# Patient Record
Sex: Female | Born: 1940 | Race: Black or African American | Hispanic: No | Smoking: Never smoker
Health system: Southern US, Community
[De-identification: ages and names within clinical notes are randomized; demographics above are authoritative.]

## PROBLEM LIST (undated history)

## (undated) DIAGNOSIS — E669 Obesity, unspecified: Secondary | ICD-10-CM

## (undated) DIAGNOSIS — C50919 Malignant neoplasm of unspecified site of unspecified female breast: Secondary | ICD-10-CM

## (undated) DIAGNOSIS — Z780 Asymptomatic menopausal state: Secondary | ICD-10-CM

## (undated) DIAGNOSIS — Z860101 Personal history of adenomatous and serrated colon polyps: Secondary | ICD-10-CM

## (undated) DIAGNOSIS — B029 Zoster without complications: Secondary | ICD-10-CM

## (undated) DIAGNOSIS — F32A Depression, unspecified: Secondary | ICD-10-CM

## (undated) DIAGNOSIS — Z923 Personal history of irradiation: Secondary | ICD-10-CM

## (undated) DIAGNOSIS — Z8619 Personal history of other infectious and parasitic diseases: Secondary | ICD-10-CM

## (undated) DIAGNOSIS — R739 Hyperglycemia, unspecified: Secondary | ICD-10-CM

## (undated) DIAGNOSIS — D649 Anemia, unspecified: Secondary | ICD-10-CM

## (undated) DIAGNOSIS — E785 Hyperlipidemia, unspecified: Secondary | ICD-10-CM

## (undated) DIAGNOSIS — Z9221 Personal history of antineoplastic chemotherapy: Secondary | ICD-10-CM

## (undated) DIAGNOSIS — Z86010 Personal history of colonic polyps: Secondary | ICD-10-CM

## (undated) DIAGNOSIS — K219 Gastro-esophageal reflux disease without esophagitis: Secondary | ICD-10-CM

## (undated) DIAGNOSIS — J449 Chronic obstructive pulmonary disease, unspecified: Secondary | ICD-10-CM

## (undated) DIAGNOSIS — N6320 Unspecified lump in the left breast, unspecified quadrant: Secondary | ICD-10-CM

## (undated) HISTORY — PX: TONSILLECTOMY: SUR1361

## (undated) HISTORY — DX: Obesity, unspecified: E66.9

## (undated) HISTORY — PX: ABDOMINAL HYSTERECTOMY: SHX81

## (undated) HISTORY — PX: TONSILECTOMY, ADENOIDECTOMY, BILATERAL MYRINGOTOMY AND TUBES: SHX2538

## (undated) HISTORY — DX: Unspecified lump in the left breast, unspecified quadrant: N63.20

## (undated) HISTORY — DX: Hyperglycemia, unspecified: R73.9

## (undated) HISTORY — DX: Malignant neoplasm of unspecified site of unspecified female breast: C50.919

## (undated) HISTORY — DX: Asymptomatic menopausal state: Z78.0

## (undated) HISTORY — DX: Zoster without complications: B02.9

---

## 2003-06-20 ENCOUNTER — Other Ambulatory Visit: Payer: Self-pay

## 2004-06-03 ENCOUNTER — Emergency Department: Payer: Self-pay | Admitting: General Practice

## 2007-09-05 ENCOUNTER — Ambulatory Visit: Payer: Self-pay | Admitting: Gastroenterology

## 2008-01-25 ENCOUNTER — Emergency Department: Payer: Self-pay | Admitting: Emergency Medicine

## 2008-03-31 ENCOUNTER — Emergency Department: Payer: Self-pay | Admitting: Emergency Medicine

## 2008-03-31 ENCOUNTER — Other Ambulatory Visit: Payer: Self-pay

## 2008-08-29 ENCOUNTER — Ambulatory Visit: Payer: Self-pay | Admitting: Internal Medicine

## 2008-10-09 ENCOUNTER — Ambulatory Visit: Payer: Self-pay | Admitting: Unknown Physician Specialty

## 2010-03-17 ENCOUNTER — Ambulatory Visit: Payer: Self-pay | Admitting: Internal Medicine

## 2011-02-04 ENCOUNTER — Ambulatory Visit: Payer: Self-pay | Admitting: Unknown Physician Specialty

## 2011-02-04 LAB — HM COLONOSCOPY

## 2011-12-02 ENCOUNTER — Ambulatory Visit: Payer: Self-pay | Admitting: Family Medicine

## 2011-12-11 ENCOUNTER — Emergency Department: Payer: Self-pay | Admitting: Emergency Medicine

## 2011-12-11 LAB — COMPREHENSIVE METABOLIC PANEL
Albumin: 3.6 g/dL (ref 3.4–5.0)
Alkaline Phosphatase: 89 U/L (ref 50–136)
Bilirubin,Total: 0.3 mg/dL (ref 0.2–1.0)
Calcium, Total: 9.6 mg/dL (ref 8.5–10.1)
Creatinine: 1.08 mg/dL (ref 0.60–1.30)
EGFR (Non-African Amer.): 51 — ABNORMAL LOW
Glucose: 79 mg/dL (ref 65–99)
Osmolality: 283 (ref 275–301)
SGOT(AST): 24 U/L (ref 15–37)

## 2011-12-11 LAB — CBC
HCT: 38.8 % (ref 35.0–47.0)
HGB: 12.5 g/dL (ref 12.0–16.0)
MCH: 29 pg (ref 26.0–34.0)
MCV: 90 fL (ref 80–100)
Platelet: 318 10*3/uL (ref 150–440)
RDW: 14.3 % (ref 11.5–14.5)
WBC: 8.3 10*3/uL (ref 3.6–11.0)

## 2012-03-01 ENCOUNTER — Ambulatory Visit: Payer: Self-pay | Admitting: Unknown Physician Specialty

## 2012-06-14 ENCOUNTER — Ambulatory Visit: Payer: Self-pay | Admitting: Unknown Physician Specialty

## 2012-07-01 DIAGNOSIS — E785 Hyperlipidemia, unspecified: Secondary | ICD-10-CM | POA: Insufficient documentation

## 2012-07-01 DIAGNOSIS — K219 Gastro-esophageal reflux disease without esophagitis: Secondary | ICD-10-CM | POA: Insufficient documentation

## 2013-12-18 DEATH — deceased

## 2014-09-12 ENCOUNTER — Ambulatory Visit: Payer: Self-pay | Admitting: Nurse Practitioner

## 2014-12-21 ENCOUNTER — Encounter: Payer: Self-pay | Admitting: *Deleted

## 2014-12-24 ENCOUNTER — Encounter: Payer: Self-pay | Admitting: Anesthesiology

## 2014-12-24 ENCOUNTER — Ambulatory Visit: Payer: Medicare Other | Admitting: Anesthesiology

## 2014-12-24 ENCOUNTER — Ambulatory Visit
Admission: RE | Admit: 2014-12-24 | Discharge: 2014-12-24 | Disposition: A | Discharge status: Home / Self Care | Payer: Medicare Other | Source: Ambulatory Visit | Attending: Unknown Physician Specialty | Admitting: Unknown Physician Specialty

## 2014-12-24 ENCOUNTER — Encounter
Admission: RE | Disposition: A | Discharge status: Home / Self Care | Payer: Self-pay | Source: Ambulatory Visit | Attending: Unknown Physician Specialty

## 2014-12-24 DIAGNOSIS — E785 Hyperlipidemia, unspecified: Secondary | ICD-10-CM | POA: Diagnosis not present

## 2014-12-24 DIAGNOSIS — Z79899 Other long term (current) drug therapy: Secondary | ICD-10-CM | POA: Insufficient documentation

## 2014-12-24 DIAGNOSIS — K219 Gastro-esophageal reflux disease without esophagitis: Secondary | ICD-10-CM | POA: Insufficient documentation

## 2014-12-24 DIAGNOSIS — Z8719 Personal history of other diseases of the digestive system: Secondary | ICD-10-CM | POA: Diagnosis present

## 2014-12-24 DIAGNOSIS — K317 Polyp of stomach and duodenum: Secondary | ICD-10-CM | POA: Insufficient documentation

## 2014-12-24 DIAGNOSIS — Z7982 Long term (current) use of aspirin: Secondary | ICD-10-CM | POA: Diagnosis not present

## 2014-12-24 HISTORY — DX: Gastro-esophageal reflux disease without esophagitis: K21.9

## 2014-12-24 HISTORY — DX: Hyperlipidemia, unspecified: E78.5

## 2014-12-24 HISTORY — PX: ESOPHAGOGASTRODUODENOSCOPY (EGD) WITH PROPOFOL: SHX5813

## 2014-12-24 SURGERY — ESOPHAGOGASTRODUODENOSCOPY (EGD) WITH PROPOFOL
Anesthesia: General

## 2014-12-24 MED ORDER — FENTANYL CITRATE (PF) 100 MCG/2ML IJ SOLN
INTRAMUSCULAR | Status: DC | PRN
  Administered 2014-12-24: 50 ug via INTRAVENOUS

## 2014-12-24 MED ORDER — PROPOFOL INFUSION 10 MG/ML OPTIME
INTRAVENOUS | Status: DC | PRN
  Administered 2014-12-24: 75 ug/kg/min via INTRAVENOUS

## 2014-12-24 MED ORDER — SODIUM CHLORIDE 0.9 % IV SOLN: INTRAVENOUS | Status: DC

## 2014-12-24 MED ORDER — GLYCOPYRROLATE 0.2 MG/ML IJ SOLN
INTRAMUSCULAR | Status: DC | PRN
  Administered 2014-12-24: 0.2 mg via INTRAVENOUS

## 2014-12-24 MED ORDER — MIDAZOLAM HCL 2 MG/2ML IJ SOLN
INTRAMUSCULAR | Status: DC | PRN
  Administered 2014-12-24: 1 mg via INTRAVENOUS

## 2014-12-24 MED ORDER — LACTATED RINGERS IV SOLN
INTRAVENOUS | Status: DC
  Administered 2014-12-24: 1000 mL via INTRAVENOUS

## 2014-12-24 NOTE — H&P (Signed)
   Primary Care Physician:  Rubbie Battiest, NP Primary Gastroenterologist:  Dr. Vira Agar  Pre-Procedure History & Physical: HPI:  Maria Patterson is a 74 y.o. female is here for an endoscopy.   Past Medical History  Diagnosis Date  . GERD (gastroesophageal reflux disease)   . Hyperlipidemia     History reviewed. No pertinent past surgical history.  Prior to Admission medications   Medication Sig Start Date End Date Taking? Authorizing Provider  aspirin 81 MG tablet Take 81 mg by mouth daily.   Yes Historical Provider, MD  atorvastatin (LIPITOR) 20 MG tablet Take 20 mg by mouth daily.   Yes Historical Provider, MD  cholecalciferol (VITAMIN D) 400 UNITS TABS tablet Take 2,000 Units by mouth.   Yes Historical Provider, MD  Multiple Vitamin (MULTIVITAMIN WITH MINERALS) TABS tablet Take 1 tablet by mouth daily.   Yes Historical Provider, MD  omeprazole (PRILOSEC) 20 MG capsule Take 20 mg by mouth daily.   Yes Historical Provider, MD    Allergies as of 11/20/2014  . (Not on File)    History reviewed. No pertinent family history.  History   Social History  . Marital Status: Widowed    Spouse Name: N/A  . Number of Children: N/A  . Years of Education: N/A   Occupational History  . Not on file.   Social History Main Topics  . Smoking status: Not on file  . Smokeless tobacco: Not on file  . Alcohol Use: Not on file  . Drug Use: Not on file  . Sexual Activity: Not on file   Other Topics Concern  . Not on file   Social History Narrative    Review of Systems: See HPI, otherwise negative ROS  Physical Exam: BP 164/71 mmHg  Pulse 62  Temp(Src) 97.3 F (36.3 C) (Tympanic)  Resp 16  Ht 5\' 4"  (1.626 m)  Wt 81.647 kg (180 lb)  BMI 30.88 kg/m2  SpO2 100% General:   Alert,  pleasant and cooperative in NAD Head:  Normocephalic and atraumatic. Neck:  Supple; no masses or thyromegaly. Lungs:  Clear throughout to auscultation.    Heart:  Regular rate and rhythm. Abdomen:   Soft, nontender and nondistended. Normal bowel sounds, without guarding, and without rebound.   Neurologic:  Alert and  oriented x4;  grossly normal neurologically.  Impression/Plan: Maria Patterson is here for an endoscopy to be performed for follow up previously removed large duodenal polyp.  Risks, benefits, limitations, and alternatives regarding  endoscopy have been reviewed with the patient.  Questions have been answered.  All parties agreeable.   Gaylyn Cheers, MD  12/24/2014, 9:25 AM

## 2014-12-24 NOTE — Anesthesia Postprocedure Evaluation (Signed)
  Anesthesia Post-op Note  Patient: Maria Patterson  Procedure(s) Performed: Procedure(s): ESOPHAGOGASTRODUODENOSCOPY (EGD) WITH PROPOFOL (N/A)  Anesthesia type:General  Patient location: PACU  Post pain: Pain level controlled  Post assessment: Post-op Vital signs reviewed, Patient's Cardiovascular Status Stable, Respiratory Function Stable, Patent Airway and No signs of Nausea or vomiting  Post vital signs: Reviewed and stable  Last Vitals:  Filed Vitals:   12/24/14 1000  BP: 150/84  Pulse: 78  Temp:   Resp: 13    Level of consciousness: awake, alert  and patient cooperative  Complications: No apparent anesthesia complications

## 2014-12-24 NOTE — Anesthesia Preprocedure Evaluation (Signed)
Anesthesia Evaluation  Patient identified by MRN, date of birth, ID band Patient awake    Reviewed: Allergy & Precautions, NPO status , Patient's Chart, lab work & pertinent test results, reviewed documented beta blocker date and time   Airway Mallampati: III  TM Distance: >3 FB     Dental  (+) Chipped   Pulmonary          Cardiovascular     Neuro/Psych    GI/Hepatic GERD-  ,  Endo/Other    Renal/GU      Musculoskeletal   Abdominal   Peds  Hematology   Anesthesia Other Findings   Reproductive/Obstetrics                             Anesthesia Physical Anesthesia Plan  ASA: III  Anesthesia Plan: General   Post-op Pain Management:    Induction: Intravenous  Airway Management Planned: Nasal Cannula  Additional Equipment:   Intra-op Plan:   Post-operative Plan:   Informed Consent: I have reviewed the patients History and Physical, chart, labs and discussed the procedure including the risks, benefits and alternatives for the proposed anesthesia with the patient or authorized representative who has indicated his/her understanding and acceptance.     Plan Discussed with: CRNA  Anesthesia Plan Comments:         Anesthesia Quick Evaluation

## 2014-12-24 NOTE — Transfer of Care (Signed)
Immediate Anesthesia Transfer of Care Note  Patient: Maria Patterson  Procedure(s) Performed: Procedure(s): ESOPHAGOGASTRODUODENOSCOPY (EGD) WITH PROPOFOL (N/A)  Patient Location: PACU  Anesthesia Type:General  Level of Consciousness: awake  Airway & Oxygen Therapy: Patient Spontanous Breathing  Post-op Assessment: Report given to RN  Post vital signs: stable  Last Vitals:  Filed Vitals:   12/24/14 0949  BP: 148/86  Pulse: 99  Temp: 36.4 C  Resp:     Complications: No apparent anesthesia complications

## 2014-12-24 NOTE — Op Note (Signed)
[facility] Gastroenterology Patient Name: Maria Patterson Procedure Date: 12/24/2014 9:22 AM MRN: [id number] Account #: [id number] Date of Birth: July 09, 1941 Admit Type: Outpatient Age: 74 Room: [facility] ENDO ROOM 1 Gender: Female Note Status: Finalized Procedure:         Upper GI endoscopy Indications:       follow up duodenal polyp Providers:         Manya Silvas, MD Referring MD:      Manya Silvas, MD (Referring MD) Medicines:         Propofol per Anesthesia Complications:     No immediate complications. Procedure:         Pre-Anesthesia Assessment:                    - After reviewing the risks and benefits, the patient was                     deemed in satisfactory condition to undergo the procedure.                    After obtaining informed consent, the endoscope was passed                     under direct vision. Throughout the procedure, the                     patient's blood pressure, pulse, and oxygen saturations                     were monitored continuously. The Olympus GIF-160 endoscope                     (S#. [id number]) was introduced through the mouth, and                     advanced to the second part of duodenum. The upper GI                     endoscopy was accomplished without difficulty. The patient                     tolerated the procedure well. Findings:      The examined esophagus was normal. GEJ 39cm.      The stomach was normal.      A single medium-sized sessile polyp with no bleeding was found in the       second part of the duodenum. Biopsies were taken with a cold forceps for       histology. This could be the ampulla somewhat enlarged and biopsies       done, tthhe mucosa had a somewhat irritated apearance. Impression:        - Normal esophagus.                    - Normal stomach.                    - A single duodenal polyp. Biopsied. Recommendation:    - Await pathology results. Manya Silvas, MD 12/24/2014 9:51:41  AM This report has been signed electronically. Number of Addenda: 0 Note Initiated On: 12/24/2014 9:22 AM      [facility]

## 2014-12-25 LAB — SURGICAL PATHOLOGY

## 2014-12-27 ENCOUNTER — Encounter: Payer: Self-pay | Admitting: Unknown Physician Specialty

## 2015-09-10 ENCOUNTER — Encounter: Payer: Self-pay | Admitting: Family Medicine

## 2015-09-10 ENCOUNTER — Ambulatory Visit (INDEPENDENT_AMBULATORY_CARE_PROVIDER_SITE_OTHER): Payer: Medicare Other | Admitting: Family Medicine

## 2015-09-10 VITALS — BP 138/72 | HR 86 | Temp 98.9°F | Resp 16 | Ht 65.0 in | Wt 177.0 lb

## 2015-09-10 DIAGNOSIS — R739 Hyperglycemia, unspecified: Secondary | ICD-10-CM | POA: Diagnosis not present

## 2015-09-10 DIAGNOSIS — Z23 Encounter for immunization: Secondary | ICD-10-CM | POA: Diagnosis not present

## 2015-09-10 DIAGNOSIS — K219 Gastro-esophageal reflux disease without esophagitis: Secondary | ICD-10-CM | POA: Diagnosis not present

## 2015-09-10 DIAGNOSIS — E785 Hyperlipidemia, unspecified: Secondary | ICD-10-CM

## 2015-09-10 NOTE — Progress Notes (Signed)
Name: Maria Patterson   MRN: [id number]    DOB: 1941/05/13   Date:09/10/2015       Progress Note  Subjective  Chief Complaint  Chief Complaint  Patient presents with  . Hyperlipidemia    follow up  . Gastroesophageal Reflux    HPI  Maria Patterson is a 75 year old female with HLD and GERD here for routine follow up. She continues to NOT take her statin medication regularly with no problems reported. Taking omeprazole only as needed for GERD with no problems reported. Otherwise takes a baby aspirin daily with no reported GI bleed or dark tarry stools. She wants to be tested for diabetes.    Past Medical History  Diagnosis Date  . GERD (gastroesophageal reflux disease)   . Hyperlipidemia     Patient Active Problem List   Diagnosis Date Noted  . Acid reflux 07/01/2012  . HLD (hyperlipidemia) 07/01/2012    Social History  Substance Use Topics  . Smoking status: Never Smoker   . Smokeless tobacco: Not on file  . Alcohol Use: Not on file     Current outpatient prescriptions:  .  aspirin 81 MG tablet, Take 81 mg by mouth daily., Disp: , Rfl:  .  atorvastatin (LIPITOR) 20 MG tablet, Take 20 mg by mouth daily., Disp: , Rfl:  .  cholecalciferol (VITAMIN D) 400 UNITS TABS tablet, Take 2,000 Units by mouth., Disp: , Rfl:  .  Multiple Vitamin (MULTIVITAMIN WITH MINERALS) TABS tablet, Take 1 tablet by mouth daily., Disp: , Rfl:  .  omeprazole (PRILOSEC) 20 MG capsule, Take 20 mg by mouth daily., Disp: , Rfl:   Past Surgical History  Procedure Laterality Date  . Esophagogastroduodenoscopy (egd) with propofol N/A 12/24/2014    Procedure: ESOPHAGOGASTRODUODENOSCOPY (EGD) WITH PROPOFOL;  Surgeon: Manya Silvas, MD;  Location: [place];  Service: Endoscopy;  Laterality: N/A;    History reviewed. No pertinent family history.  Allergies  Allergen Reactions  . Latex Itching  . Penicillins Rash     Review of Systems  CONSTITUTIONAL: No significant weight changes, fever,  chills, weakness or fatigue.  HEENT:  - Eyes: No visual changes.  - Ears: No auditory changes. No pain.  - Nose: No sneezing, congestion, runny nose. - Throat: No sore throat. No changes in swallowing. SKIN: No rash or itching.  CARDIOVASCULAR: No chest pain, chest pressure or chest discomfort. No palpitations or edema.  RESPIRATORY: No shortness of breath, cough or sputum.  GASTROINTESTINAL: No anorexia, nausea, vomiting. No changes in bowel habits. No abdominal pain or blood.  GENITOURINARY: No dysuria. No frequency. No discharge.  NEUROLOGICAL: No headache, dizziness, syncope, paralysis, ataxia, numbness or tingling in the extremities. No memory changes. No change in bowel or bladder control.  MUSCULOSKELETAL: No joint pain. No muscle pain. HEMATOLOGIC: No anemia, bleeding or bruising.  LYMPHATICS: No enlarged lymph nodes.  PSYCHIATRIC: No change in mood. No change in sleep pattern.  ENDOCRINOLOGIC: No reports of sweating, cold or heat intolerance. No polyuria or polydipsia.     Objective  BP 138/72 mmHg  Pulse 86  Temp(Src) 98.9 F (37.2 C) (Oral)  Resp 16  Ht 5\' 5"  (1.651 m)  Wt 177 lb (80.287 kg)  BMI 29.45 kg/m2  SpO2 96% Body mass index is 29.45 kg/(m^2).  Physical Exam  Constitutional: Patient appears well-developed and well-nourished. In no distress.  HEENT:  - Head: Normocephalic and atraumatic.  - Ears: Bilateral TMs gray, no erythema or effusion - Nose: Nasal mucosa moist -  Mouth/Throat: Oropharynx is clear and moist. No tonsillar hypertrophy or erythema. No post nasal drainage.  - Eyes: Conjunctivae clear, EOM movements normal. PERRLA. No scleral icterus.  Neck: Normal range of motion. Neck supple. No JVD present. No thyromegaly present.  Cardiovascular: Normal rate, regular rhythm and normal heart sounds.  No murmur heard.  Pulmonary/Chest: Effort normal and breath sounds normal. No respiratory distress. Musculoskeletal: Normal range of motion bilateral UE  and LE, no joint effusions. Peripheral vascular: Bilateral LE no edema. Neurological: CN II-XII grossly intact with no focal deficits. Alert and oriented to person, place, and time. Coordination, balance, strength, speech and gait are normal.  Skin: Skin is warm and dry. No rash noted. No erythema.  Psychiatric: Patient has a normal mood and affect. Behavior is normal in office today. Judgment and thought content normal in office today.  Assessment & Plan   1. GERD without esophagitis Using PPI prn only.  - CBC with Differential/Platelet - Comprehensive metabolic panel  2. Hyperlipidemia LDL goal <100 Inconsistent statin use.  - CBC with Differential/Platelet - Comprehensive metabolic panel - Lipid panel - TSH  3. Hyperglycemia Will test for pre-diabetes or diabetes.  - Hemoglobin A1c - TSH  4. Need for pneumococcal vaccination  - Pneumococcal conjugate vaccine 13-valent  5. Needs flu shot  - Flu vaccine HIGH DOSE PF (Fluzone High dose)

## 2015-10-24 LAB — CBC WITH DIFFERENTIAL/PLATELET
Basophils Absolute: 0 10*3/uL (ref 0.0–0.2)
Basos: 1 %
EOS (ABSOLUTE): 0.2 10*3/uL (ref 0.0–0.4)
EOS: 3 %
HEMATOCRIT: 36.1 % (ref 34.0–46.6)
Hemoglobin: 11.9 g/dL (ref 11.1–15.9)
IMMATURE GRANS (ABS): 0 10*3/uL (ref 0.0–0.1)
IMMATURE GRANULOCYTES: 0 %
LYMPHS: 47 %
Lymphocytes Absolute: 2.6 10*3/uL (ref 0.7–3.1)
MCH: 29.3 pg (ref 26.6–33.0)
MCHC: 33 g/dL (ref 31.5–35.7)
MCV: 89 fL (ref 79–97)
MONOS ABS: 0.3 10*3/uL (ref 0.1–0.9)
Monocytes: 5 %
NEUTROS PCT: 44 %
Neutrophils Absolute: 2.4 10*3/uL (ref 1.4–7.0)
PLATELETS: 247 10*3/uL (ref 150–379)
RBC: 4.06 x10E6/uL (ref 3.77–5.28)
RDW: 15 % (ref 12.3–15.4)
WBC: 5.5 10*3/uL (ref 3.4–10.8)

## 2015-10-24 LAB — COMPREHENSIVE METABOLIC PANEL
ALT: 13 IU/L (ref 0–32)
AST: 17 IU/L (ref 0–40)
Albumin/Globulin Ratio: 1.5 (ref 1.2–2.2)
Albumin: 3.8 g/dL (ref 3.5–4.8)
Alkaline Phosphatase: 81 IU/L (ref 39–117)
BUN/Creatinine Ratio: 20 (ref 12–28)
BUN: 24 mg/dL (ref 8–27)
Bilirubin Total: 0.2 mg/dL (ref 0.0–1.2)
CALCIUM: 9.5 mg/dL (ref 8.7–10.3)
CO2: 19 mmol/L (ref 18–29)
CREATININE: 1.22 mg/dL — AB (ref 0.57–1.00)
Chloride: 110 mmol/L — ABNORMAL HIGH (ref 96–106)
GFR calc Af Amer: 50 mL/min/{1.73_m2} — ABNORMAL LOW (ref >59)
GFR, EST NON AFRICAN AMERICAN: 43 mL/min/{1.73_m2} — AB (ref >59)
Globulin, Total: 2.6 g/dL (ref 1.5–4.5)
Glucose: 78 mg/dL (ref 65–99)
Potassium: 4.6 mmol/L (ref 3.5–5.2)
Sodium: 144 mmol/L (ref 134–144)
TOTAL PROTEIN: 6.4 g/dL (ref 6.0–8.5)

## 2015-10-24 LAB — LIPID PANEL
CHOLESTEROL TOTAL: 318 mg/dL — AB (ref 100–199)
Chol/HDL Ratio: 4.6 ratio units — ABNORMAL HIGH (ref 0.0–4.4)
HDL: 69 mg/dL (ref >39)
LDL Calculated: 233 mg/dL — ABNORMAL HIGH (ref 0–99)
TRIGLYCERIDES: 78 mg/dL (ref 0–149)
VLDL CHOLESTEROL CAL: 16 mg/dL (ref 5–40)

## 2015-10-24 LAB — TSH: TSH: 0.713 u[IU]/mL (ref 0.450–4.500)

## 2015-10-24 LAB — HEMOGLOBIN A1C
ESTIMATED AVERAGE GLUCOSE: 117 mg/dL
Hgb A1c MFr Bld: 5.7 % — ABNORMAL HIGH (ref 4.8–5.6)

## 2015-10-26 ENCOUNTER — Telehealth: Payer: Self-pay | Admitting: Family Medicine

## 2015-10-26 DIAGNOSIS — E785 Hyperlipidemia, unspecified: Secondary | ICD-10-CM

## 2015-10-26 DIAGNOSIS — Z5181 Encounter for therapeutic drug level monitoring: Secondary | ICD-10-CM

## 2015-10-26 NOTE — Telephone Encounter (Signed)
Labs ordered by Dr. Chauncey Cruel received; will call patient with results; very high LDL

## 2015-10-28 NOTE — Telephone Encounter (Signed)
Tried reaching patient; will try again another day

## 2015-10-30 DIAGNOSIS — Z5181 Encounter for therapeutic drug level monitoring: Secondary | ICD-10-CM | POA: Insufficient documentation

## 2015-10-30 MED ORDER — ATORVASTATIN CALCIUM 40 MG PO TABS: 40.0000 mg | ORAL_TABLET | Freq: Every day | ORAL | Status: DC

## 2015-10-30 NOTE — Assessment & Plan Note (Signed)
Start 40 mg atorvastatin; recheck lipids, sgpt 8 weeks

## 2015-10-30 NOTE — Assessment & Plan Note (Signed)
Check SGPT in 8 weeks 

## 2015-10-30 NOTE — Telephone Encounter (Signed)
I spoke with patient; she actually quit taking her statin a while back, just said it was old; no problems on it, willing to go back on and wonders if she needs higher dose after I read her the numbers; I have no doubt this is inherited; start atorvastatin 40 mg, recheck lipids and sgpt in 8 weeks; return in August for next regular visit

## 2016-03-09 ENCOUNTER — Encounter: Payer: Self-pay | Admitting: Family Medicine

## 2016-03-09 ENCOUNTER — Other Ambulatory Visit: Payer: Self-pay | Admitting: Family Medicine

## 2016-03-09 ENCOUNTER — Ambulatory Visit (INDEPENDENT_AMBULATORY_CARE_PROVIDER_SITE_OTHER): Payer: Medicare Other | Admitting: Family Medicine

## 2016-03-09 DIAGNOSIS — J0100 Acute maxillary sinusitis, unspecified: Secondary | ICD-10-CM

## 2016-03-09 DIAGNOSIS — M2550 Pain in unspecified joint: Secondary | ICD-10-CM | POA: Insufficient documentation

## 2016-03-09 DIAGNOSIS — E785 Hyperlipidemia, unspecified: Secondary | ICD-10-CM

## 2016-03-09 DIAGNOSIS — R634 Abnormal weight loss: Secondary | ICD-10-CM | POA: Diagnosis not present

## 2016-03-09 DIAGNOSIS — R739 Hyperglycemia, unspecified: Secondary | ICD-10-CM | POA: Diagnosis not present

## 2016-03-09 DIAGNOSIS — K219 Gastro-esophageal reflux disease without esophagitis: Secondary | ICD-10-CM | POA: Diagnosis not present

## 2016-03-09 DIAGNOSIS — E663 Overweight: Secondary | ICD-10-CM | POA: Diagnosis not present

## 2016-03-09 DIAGNOSIS — J019 Acute sinusitis, unspecified: Secondary | ICD-10-CM | POA: Insufficient documentation

## 2016-03-09 HISTORY — DX: Pain in unspecified joint: M25.50

## 2016-03-09 LAB — COMPREHENSIVE METABOLIC PANEL
ALT: 15 U/L (ref 6–29)
AST: 16 U/L (ref 10–35)
Albumin: 4 g/dL (ref 3.6–5.1)
Alkaline Phosphatase: 76 U/L (ref 33–130)
BILIRUBIN TOTAL: 0.4 mg/dL (ref 0.2–1.2)
BUN: 19 mg/dL (ref 7–25)
CALCIUM: 9.4 mg/dL (ref 8.6–10.4)
CO2: 24 mmol/L (ref 20–31)
Chloride: 111 mmol/L — ABNORMAL HIGH (ref 98–110)
Creat: 1.16 mg/dL — ABNORMAL HIGH (ref 0.60–0.93)
GLUCOSE: 84 mg/dL (ref 65–99)
POTASSIUM: 4.2 mmol/L (ref 3.5–5.3)
Sodium: 144 mmol/L (ref 135–146)
Total Protein: 6.7 g/dL (ref 6.1–8.1)

## 2016-03-09 LAB — CBC WITH DIFFERENTIAL/PLATELET
Basophils Absolute: 65 cells/uL (ref 0–200)
Basophils Relative: 1 %
EOS PCT: 3 %
Eosinophils Absolute: 195 cells/uL (ref 15–500)
HCT: 38 % (ref 35.0–45.0)
Hemoglobin: 12.3 g/dL (ref 11.7–15.5)
LYMPHS ABS: 2925 {cells}/uL (ref 850–3900)
Lymphocytes Relative: 45 %
MCH: 29 pg (ref 27.0–33.0)
MCHC: 32.4 g/dL (ref 32.0–36.0)
MCV: 89.6 fL (ref 80.0–100.0)
MONOS PCT: 5 %
MPV: 10.7 fL (ref 7.5–12.5)
Monocytes Absolute: 325 cells/uL (ref 200–950)
NEUTROS ABS: 2990 {cells}/uL (ref 1500–7800)
NEUTROS PCT: 46 %
PLATELETS: 235 10*3/uL (ref 140–400)
RBC: 4.24 MIL/uL (ref 3.80–5.10)
RDW: 15.3 % — ABNORMAL HIGH (ref 11.0–15.0)
WBC: 6.5 10*3/uL (ref 3.8–10.8)

## 2016-03-09 LAB — URIC ACID: URIC ACID, SERUM: 6.9 mg/dL (ref 2.5–7.0)

## 2016-03-09 LAB — CK: CK TOTAL: 67 U/L (ref 7–177)

## 2016-03-09 MED ORDER — DOXYCYCLINE HYCLATE 100 MG PO TABS: 100.0000 mg | ORAL_TABLET | Freq: Two times a day (BID) | ORAL | 0 refills | Status: AC

## 2016-03-09 NOTE — Patient Instructions (Addendum)
Stop the statin Try to limit saturated fats in your diet (bologna, hot dogs, barbeque, cheeseburgers, hamburgers, steak, bacon, sausage, cheese, etc.) and get more fresh fruits, vegetables, and whole grains We'll check labs today Start antibiotics Please do eat yogurt daily or take a probiotic daily for the next month or two We want to replace the healthy germs in the gut If you notice foul, watery diarrhea in the next two months, schedule an appointment RIGHT AWAY If you are still aching in 2 weeks, please call me Caution: prolonged use of proton pump inhibitors like omeprazole (Prilosec), pantoprazole (Protonix), esomeprazole (Nexium), and others like Dexilant and Aciphex may increase your risk of pneumonia, Clostridium difficile colitis, osteoporosis, anemia and other health complications Try to limit or avoid triggers like coffee, caffeinated beverages, onions, chocolate, spicy foods, peppermint, acid foods like pizza, spaghetti sauce, and orange juice Lose weight if you are overweight or obese Try elevating the head of your bed by placing a small wedge between your mattress and box springs to keep acid in the stomach at night instead of coming up into your esophagus

## 2016-03-09 NOTE — Assessment & Plan Note (Signed)
Limit saturated fats; stop statin because of aching

## 2016-03-09 NOTE — Assessment & Plan Note (Signed)
Check labs; stop statin to see if that helps

## 2016-03-09 NOTE — Progress Notes (Signed)
BP 140/80   Pulse 86   Temp 98.9 F (37.2 C) (Oral)   Resp 14   Wt 171 lb (77.6 kg)   SpO2 96%   BMI 28.46 kg/m    Subjective:    Patient ID: Maria Patterson, female    DOB: [birth year], 75 y.o.   MRN: [id number]  HPI: Maria Patterson is a 75 y.o. female  Chief Complaint  Patient presents with  . Follow-up    6 months   Patient has high cholesterol; see phone note from April; she was supposed to go back on her statin then get labs rechecked; she's not gotten the labs redone though, but she   GERD; not bad, no blood in the stool; no ulcers; no abd pain; had EGD; uses PPI occasionally  Obesity; she has lost 9 pounds in 14 months; she thinks if she would stop eating all this mess, she could lose even more  She has some discomfort in lots of different places; in the right hip, right leg, mons pubis, upper part of the chest, right great toe, right middle finger; not sure if gout; no fam hx of gout; no fam hx of arthritis; she denies burning of urination; she has gone back on the statin  Sores in the right nostril; swollen; no fever  Depression screen [facility] 2/9 03/09/2016 09/10/2015  Decreased Interest 0 0  Down, Depressed, Hopeless 1 0  PHQ - 2 Score 1 0   Relevant past medical, surgical, family and social history reviewed Past Medical History:  Diagnosis Date  . GERD (gastroesophageal reflux disease)   . Hyperlipidemia   . Obesity    Past Surgical History:  Procedure Laterality Date  . ABDOMINAL HYSTERECTOMY    . ESOPHAGOGASTRODUODENOSCOPY (EGD) WITH PROPOFOL N/A 12/24/2014   Procedure: ESOPHAGOGASTRODUODENOSCOPY (EGD) WITH PROPOFOL;  Surgeon: Manya Silvas, MD;  Location: [facility] ENDOSCOPY;  Service: Endoscopy;  Laterality: N/A;  . TONSILECTOMY, ADENOIDECTOMY, BILATERAL MYRINGOTOMY AND TUBES     No family history on file. Social History  Substance Use Topics  . Smoking status: Never Smoker  . Smokeless tobacco: Not on file  . Alcohol use Not on file   Interim medical  history since last visit reviewed. Allergies and medications reviewed  Review of Systems Per HPI unless specifically indicated above     Objective:    BP 140/80   Pulse 86   Temp 98.9 F (37.2 C) (Oral)   Resp 14   Wt 171 lb (77.6 kg)   SpO2 96%   BMI 28.46 kg/m   Wt Readings from Last 3 Encounters:  03/09/16 171 lb (77.6 kg)  09/10/15 177 lb (80.3 kg)  12/24/14 180 lb (81.6 kg)    Physical Exam  Constitutional: She appears well-developed and well-nourished. No distress.  HENT:  Head: Normocephalic and atraumatic.  Right Ear: Hearing, external ear and ear canal normal. Tympanic membrane is erythematous.  Left Ear: Hearing, external ear and ear canal normal. Tympanic membrane is erythematous.  Nose: Mucosal edema (slight edema and erythema in nostrils) and sinus tenderness present. No rhinorrhea.  Mouth/Throat: Oropharynx is clear and moist and mucous membranes are normal.  Eyes: EOM are normal. No scleral icterus.  Neck: No thyromegaly present.  Cardiovascular: Normal rate, regular rhythm and normal heart sounds.   No murmur heard. Pulmonary/Chest: Effort normal and breath sounds normal. No respiratory distress. She has no wheezes. She exhibits tenderness and edema. She exhibits no crepitus.    Abdominal: Soft. Bowel sounds are normal. She  exhibits no distension.  Musculoskeletal: Normal range of motion. She exhibits no edema.  Lymphadenopathy:    She has no cervical adenopathy.  Neurological: She is alert. She exhibits normal muscle tone.  Skin: Skin is warm and dry. She is not diaphoretic. No pallor.  Psychiatric: She has a normal mood and affect. Her behavior is normal. Judgment and thought content normal. Her mood appears not anxious. She does not exhibit a depressed mood.    Results for orders placed or performed in visit on 09/10/15  CBC with Differential/Platelet  Result Value Ref Range   WBC 5.5 3.4 - 10.8 x10E3/uL   RBC 4.06 3.77 - 5.28 x10E6/uL    Hemoglobin 11.9 11.1 - 15.9 g/dL   Hematocrit 36.1 34.0 - 46.6 %   MCV 89 79 - 97 fL   MCH 29.3 26.6 - 33.0 pg   MCHC 33.0 31.5 - 35.7 g/dL   RDW 15.0 12.3 - 15.4 %   Platelets 247 150 - 379 x10E3/uL   Neutrophils 44 %   Lymphs 47 %   Monocytes 5 %   Eos 3 %   Basos 1 %   Neutrophils Absolute 2.4 1.4 - 7.0 x10E3/uL   Lymphocytes Absolute 2.6 0.7 - 3.1 x10E3/uL   Monocytes Absolute 0.3 0.1 - 0.9 x10E3/uL   EOS (ABSOLUTE) 0.2 0.0 - 0.4 x10E3/uL   Basophils Absolute 0.0 0.0 - 0.2 x10E3/uL   Immature Granulocytes 0 %   Immature Grans (Abs) 0.0 0.0 - 0.1 x10E3/uL  Comprehensive metabolic panel  Result Value Ref Range   Glucose 78 65 - 99 mg/dL   BUN 24 8 - 27 mg/dL   Creatinine, Ser 1.22 (H) 0.57 - 1.00 mg/dL   GFR calc non Af Amer 43 (L) >59 mL/min/1.73   GFR calc Af Amer 50 (L) >59 mL/min/1.73   BUN/Creatinine Ratio 20 12 - 28   Sodium 144 134 - 144 mmol/L   Potassium 4.6 3.5 - 5.2 mmol/L   Chloride 110 (H) 96 - 106 mmol/L   CO2 19 18 - 29 mmol/L   Calcium 9.5 8.7 - 10.3 mg/dL   Total Protein 6.4 6.0 - 8.5 g/dL   Albumin 3.8 3.5 - 4.8 g/dL   Globulin, Total 2.6 1.5 - 4.5 g/dL   Albumin/Globulin Ratio 1.5 1.2 - 2.2   Bilirubin Total 0.2 0.0 - 1.2 mg/dL   Alkaline Phosphatase 81 39 - 117 IU/L   AST 17 0 - 40 IU/L   ALT 13 0 - 32 IU/L  Hemoglobin A1c  Result Value Ref Range   Hgb A1c MFr Bld 5.7 (H) 4.8 - 5.6 %   Est. average glucose Bld gHb Est-mCnc 117 mg/dL  Lipid panel  Result Value Ref Range   Cholesterol, Total 318 (H) 100 - 199 mg/dL   Triglycerides 78 0 - 149 mg/dL   HDL 69 >39 mg/dL   VLDL Cholesterol Cal 16 5 - 40 mg/dL   LDL Calculated 233 (H) 0 - 99 mg/dL   Comment: Comment    Chol/HDL Ratio 4.6 (H) 0.0 - 4.4 ratio units  TSH  Result Value Ref Range   TSH 0.713 0.450 - 4.500 uIU/mL      Assessment & Plan:   Problem List Items Addressed This Visit      Respiratory   Sinusitis, acute    Start doxy      Relevant Medications   doxycycline  (VIBRA-TABS) 100 MG tablet     Digestive   GERD without esophagitis  Limit triggers; cautions given about PPI use; just use PPI every now and then        Other   Weight loss    Check TSH      Relevant Orders   Comprehensive metabolic panel   TSH   Hyperlipidemia LDL goal <100    Limit saturated fats; stop statin because of aching      Hyperglycemia    Check glucose and A1c; limit white breads, rice, sugars      Relevant Orders   Hemoglobin A1c   Ache in joint    Check labs; stop statin to see if that helps      Relevant Orders   CBC with Differential/Platelet   Comprehensive metabolic panel   Uric acid   CK (Creatine Kinase)   ANA,IFA RA Diag Pnl w/rflx Tit/Patn    Other Visit Diagnoses   None.     Follow up plan: Return in about 6 months (around 09/09/2016) for regular follow-up; Medicare Visit when due.  An after-visit summary was printed and given to the patient at [place].  Please see the patient instructions which may contain other information and recommendations beyond what is mentioned above in the assessment and plan.  Meds ordered this encounter  Medications  . doxycycline (VIBRA-TABS) 100 MG tablet    Sig: Take 1 tablet (100 mg total) by mouth 2 (two) times daily.    Dispense:  14 tablet    Refill:  0    Orders Placed This Encounter  Procedures  . CBC with Differential/Platelet  . Hemoglobin A1c  . Comprehensive metabolic panel  . Uric acid  . CK (Creatine Kinase)  . ANA,IFA RA Diag Pnl w/rflx Tit/Patn  . TSH

## 2016-03-09 NOTE — Assessment & Plan Note (Signed)
Check glucose and A1c; limit white breads, rice, sugars

## 2016-03-09 NOTE — Assessment & Plan Note (Signed)
Start doxy

## 2016-03-09 NOTE — Assessment & Plan Note (Signed)
Check TSH 

## 2016-03-09 NOTE — Assessment & Plan Note (Signed)
Encouraged pt in her weight loss efforts; she'll try to eat better

## 2016-03-09 NOTE — Assessment & Plan Note (Signed)
Limit triggers; cautions given about PPI use; just use PPI every now and then

## 2016-03-10 LAB — HEMOGLOBIN A1C
HEMOGLOBIN A1C: 5.2 % (ref <5.7)
MEAN PLASMA GLUCOSE: 103 mg/dL

## 2016-03-10 LAB — ANA,IFA RA DIAG PNL W/RFLX TIT/PATN
ANA: NEGATIVE
Cyclic Citrullin Peptide Ab: <16 Units
Rhuematoid fact SerPl-aCnc: <10 IU/mL (ref <=14)

## 2016-03-13 LAB — TSH: TSH: 0.96 mIU/L

## 2016-03-16 ENCOUNTER — Other Ambulatory Visit: Payer: Self-pay

## 2016-03-16 DIAGNOSIS — E785 Hyperlipidemia, unspecified: Secondary | ICD-10-CM

## 2016-04-17 ENCOUNTER — Telehealth: Payer: Self-pay | Admitting: Family Medicine

## 2016-04-17 NOTE — Telephone Encounter (Signed)
Pt called stating she doxycycline 100 mg after about a week and she stopped it due to getting side affects and then she started it back up and patient started swelling on her left and left eye turned red. Pt would like advise as to what else she can do.Please advise.

## 2016-04-17 NOTE — Telephone Encounter (Signed)
Pt said her left eye is better but she is swollen. Suggest going to urgent care but she has a balance there and can not go there. I suggest calling us Monday to see if we can get her appointment for either that day or some time that week.

## 2016-04-17 NOTE — Telephone Encounter (Signed)
I would advise going to urgent care; it is Friday afternoon, and I haven't seen her in over a month, so I don't know what she has; I recommend she see someone, either urgent care or MyChart visit

## 2016-07-20 DIAGNOSIS — C50919 Malignant neoplasm of unspecified site of unspecified female breast: Secondary | ICD-10-CM

## 2016-07-20 DIAGNOSIS — Z923 Personal history of irradiation: Secondary | ICD-10-CM

## 2016-07-20 DIAGNOSIS — Z9221 Personal history of antineoplastic chemotherapy: Secondary | ICD-10-CM

## 2016-07-20 HISTORY — DX: Malignant neoplasm of unspecified site of unspecified female breast: C50.919

## 2016-07-20 HISTORY — DX: Personal history of irradiation: Z92.3

## 2016-07-20 HISTORY — DX: Personal history of antineoplastic chemotherapy: Z92.21

## 2016-09-01 ENCOUNTER — Encounter: Payer: Self-pay | Admitting: Family Medicine

## 2016-09-01 ENCOUNTER — Ambulatory Visit (INDEPENDENT_AMBULATORY_CARE_PROVIDER_SITE_OTHER): Payer: Medicare Other | Admitting: Family Medicine

## 2016-09-01 VITALS — BP 128/74 | HR 74 | Temp 98.2°F | Resp 16 | Wt 171.2 lb

## 2016-09-01 DIAGNOSIS — Z23 Encounter for immunization: Secondary | ICD-10-CM | POA: Diagnosis not present

## 2016-09-01 DIAGNOSIS — N63 Unspecified lump in unspecified breast: Secondary | ICD-10-CM | POA: Diagnosis not present

## 2016-09-01 DIAGNOSIS — I739 Peripheral vascular disease, unspecified: Secondary | ICD-10-CM | POA: Diagnosis not present

## 2016-09-01 DIAGNOSIS — R739 Hyperglycemia, unspecified: Secondary | ICD-10-CM

## 2016-09-01 DIAGNOSIS — E785 Hyperlipidemia, unspecified: Secondary | ICD-10-CM

## 2016-09-01 DIAGNOSIS — Z5181 Encounter for therapeutic drug level monitoring: Secondary | ICD-10-CM | POA: Diagnosis not present

## 2016-09-01 LAB — CBC WITH DIFFERENTIAL/PLATELET
BASOS PCT: 1 %
Basophils Absolute: 66 cells/uL (ref 0–200)
EOS ABS: 132 {cells}/uL (ref 15–500)
Eosinophils Relative: 2 %
HCT: 38.7 % (ref 35.0–45.0)
Hemoglobin: 12.4 g/dL (ref 11.7–15.5)
LYMPHS PCT: 39 %
Lymphs Abs: 2574 cells/uL (ref 850–3900)
MCH: 28.8 pg (ref 27.0–33.0)
MCHC: 32 g/dL (ref 32.0–36.0)
MCV: 89.8 fL (ref 80.0–100.0)
MONOS PCT: 9 %
MPV: 11 fL (ref 7.5–12.5)
Monocytes Absolute: 594 cells/uL (ref 200–950)
NEUTROS ABS: 3234 {cells}/uL (ref 1500–7800)
Neutrophils Relative %: 49 %
Platelets: 239 10*3/uL (ref 140–400)
RBC: 4.31 MIL/uL (ref 3.80–5.10)
RDW: 14.7 % (ref 11.0–15.0)
WBC: 6.6 10*3/uL (ref 3.8–10.8)

## 2016-09-01 LAB — COMPREHENSIVE METABOLIC PANEL
ALBUMIN: 4.1 g/dL (ref 3.6–5.1)
ALK PHOS: 94 U/L (ref 33–130)
ALT: 20 U/L (ref 6–29)
AST: 22 U/L (ref 10–35)
BILIRUBIN TOTAL: 0.5 mg/dL (ref 0.2–1.2)
BUN: 23 mg/dL (ref 7–25)
CALCIUM: 9.5 mg/dL (ref 8.6–10.4)
CO2: 24 mmol/L (ref 20–31)
CREATININE: 1.14 mg/dL — AB (ref 0.60–0.93)
Chloride: 113 mmol/L — ABNORMAL HIGH (ref 98–110)
Glucose, Bld: 77 mg/dL (ref 65–99)
Potassium: 4.7 mmol/L (ref 3.5–5.3)
SODIUM: 144 mmol/L (ref 135–146)
Total Protein: 6.8 g/dL (ref 6.1–8.1)

## 2016-09-01 LAB — LIPID PANEL
CHOL/HDL RATIO: 3.7 ratio (ref <5.0)
CHOLESTEROL: 289 mg/dL — AB (ref <200)
HDL: 78 mg/dL (ref >50)
LDL Cholesterol: 192 mg/dL — ABNORMAL HIGH (ref <100)
Triglycerides: 97 mg/dL (ref <150)
VLDL: 19 mg/dL (ref <30)

## 2016-09-01 NOTE — Assessment & Plan Note (Signed)
Check fasting glucose and A1c; try to avoid white bread, rice, sugar, etc

## 2016-09-01 NOTE — Progress Notes (Signed)
BP 128/74   Pulse 74   Temp 98.2 F (36.8 C) (Oral)   Resp 16   Wt 171 lb 4 oz (77.7 kg)   SpO2 97%   BMI 28.50 kg/m    Subjective:    Patient ID: Maria Patterson, female    DOB: August 03, 1940, 76 y.o.   MRN: [id number]  HPI: [name] is a 76 y.o. female  Chief Complaint  Patient presents with  . Leg Pain    both legs poor circulation     Patient was not booked for a physical She saw a home health nurse; was diagnosed with PAD in both feet; left severe, right was moderate She brought in the yellow piece of paper, dated Aug 25, 2016 Left was 0.23 and the right was 0.61 BP that day was 130/76 Never smoker; no one in the family had peripheral vascular disease High cholesterol; on atorvastatin and she is just taking 20 mg (half of a pill), thought that the 40 mg was making her woozy Lab Results  Component Value Date   CHOL 289 (H) 09/01/2016   CHOL 318 (H) 10/23/2015   Lab Results  Component Value Date   HDL 78 09/01/2016   HDL 69 10/23/2015   Lab Results  Component Value Date   LDLCALC 192 (H) 09/01/2016   LDLCALC 233 (H) 10/23/2015   Lab Results  Component Value Date   TRIG 97 09/01/2016   TRIG 78 10/23/2015   Lab Results  Component Value Date   CHOLHDL 3.7 09/01/2016   CHOLHDL 4.6 (H) 10/23/2015   No results found for: LDLDIRECT  Left breast has more lumps; she has not had a mammogram for a while; last mammogram was 4 years ago she thinks; not checking breasts regularly and the left looks larger and felt big lumps over both breasts  Sinus issues; comes on and she can't get rid of it; bloody tinged drainage; dry throat  She is taking the iron every other day; she takes her vitamin about the same  She is taking prilosec 20 mg; I asked if she is aware of the dangers of that medicine; reflux is better  Depression screen [facility] 2/9 09/01/2016 03/09/2016 09/10/2015  Decreased Interest 0 0 0  Down, Depressed, Hopeless 0 1 0  PHQ - 2 Score 0 1 0   Relevant past  medical, surgical, family and social history reviewed Past Medical History:  Diagnosis Date  . GERD (gastroesophageal reflux disease)   . Hyperlipidemia   . Obesity    Past Surgical History:  Procedure Laterality Date  . ABDOMINAL HYSTERECTOMY    . ESOPHAGOGASTRODUODENOSCOPY (EGD) WITH PROPOFOL N/A 12/24/2014   Procedure: ESOPHAGOGASTRODUODENOSCOPY (EGD) WITH PROPOFOL;  Surgeon: Manya Silvas, MD;  Location: [facility] ENDOSCOPY;  Service: Endoscopy;  Laterality: N/A;  . TONSILECTOMY, ADENOIDECTOMY, BILATERAL MYRINGOTOMY AND TUBES     Family History  Problem Relation Age of Onset  . Stroke Mother   . Stroke Father   . Cancer Neg Hx   . Depression Neg Hx    Social History  Substance Use Topics  . Smoking status: Never Smoker  . Smokeless tobacco: Never Used  . Alcohol use Not on file   Interim medical history since last visit reviewed. Allergies and medications reviewed  Review of Systems Per HPI unless specifically indicated above     Objective:    BP 128/74   Pulse 74   Temp 98.2 F (36.8 C) (Oral)   Resp 16   Wt 171  lb 4 oz (77.7 kg)   SpO2 97%   BMI 28.50 kg/m   Wt Readings from Last 3 Encounters:  09/01/16 171 lb 4 oz (77.7 kg)  03/09/16 171 lb (77.6 kg)  09/10/15 177 lb (80.3 kg)    Physical Exam  Constitutional: She appears well-developed and well-nourished.  HENT:  Mouth/Throat: Mucous membranes are normal.  Eyes: EOM are normal. No scleral icterus.  Cardiovascular: Normal rate and regular rhythm.   Pulses:      Dorsalis pedis pulses are 1+ on the right side, and 1+ on the left side.  Feet/toes are not violaceous or mottled  Pulmonary/Chest: Effort normal and breath sounds normal.  Psychiatric: She has a normal mood and affect. Her behavior is normal. Her mood appears not anxious.       Assessment & Plan:   Problem List Items Addressed This Visit      Other   Needs flu shot   Relevant Orders   Flu vaccine HIGH DOSE PF (Fluzone High dose)  (Completed)   Medication monitoring encounter    Monitor sgpt      Relevant Orders   Comprehensive metabolic panel (Completed)   CBC with Differential/Platelet (Completed)   Hyperlipidemia LDL goal <100    Check fasting lipids today; continue statin; try to limit saturated fats      Relevant Medications   atorvastatin (LIPITOR) 40 MG tablet   Other Relevant Orders   Lipid panel (Completed)   Ambulatory referral to Vascular Surgery   Hyperglycemia    Check fasting glucose and A1c; try to avoid white bread, rice, sugar, etc      Relevant Orders   Hemoglobin A1c (Completed)    Other Visit Diagnoses    Peripheral arterial disease ([place])    -  Primary   Relevant Medications   atorvastatin (LIPITOR) 40 MG tablet   Other Relevant Orders   Ambulatory referral to Vascular Surgery   Breast lump in female       Relevant Orders   US BREAST LTD UNI RIGHT INC AXILLA   MM [employer] [facility] [employer] [employer]   US BREAST LTD UNI LEFT INC AXILLA       Follow up plan: No Follow-up on file.  An after-visit summary was printed and given to the patient at [place].  Please see the patient instructions which may contain other information and recommendations beyond what is mentioned above in the assessment and plan.  Meds ordered this encounter  Medications  . DISCONTD: atorvastatin (LIPITOR) 40 MG tablet    Sig: Take 40 mg by mouth once.  Marland Kitchen DISCONTD: ferrous sulfate 325 (65 FE) MG EC tablet    Sig: Take 325 mg by mouth daily with breakfast.  . omeprazole (PRILOSEC) 20 MG capsule    Sig: Take 20 mg by mouth daily as needed.  . Cholecalciferol (VITAMIN D) 2000 units tablet    Sig: One by mouth twice a week  . Multiple Vitamin (MULTIVITAMIN WITH MINERALS) TABS tablet    Sig: One by mouth twice a week  . ferrous sulfate 325 (65 FE) MG EC tablet    Sig: One by mouth twice a week  . atorvastatin (LIPITOR) 40 MG tablet    Sig: Take 20 mg by mouth at bedtime. Patients takes  0.5 of 40 mg tablet  at bedtime    Orders Placed This Encounter  Procedures  . US BREAST LTD UNI RIGHT INC AXILLA  . MM [place]  . US BREAST  LTD UNI LEFT INC AXILLA  . Flu vaccine HIGH DOSE PF (Fluzone High dose)  . Hemoglobin A1c  . Lipid panel  . Comprehensive metabolic panel  . CBC with Differential/Platelet  . Ambulatory referral to Vascular Surgery

## 2016-09-01 NOTE — Assessment & Plan Note (Signed)
Check fasting lipids today; continue statin; try to limit saturated fats

## 2016-09-01 NOTE — Patient Instructions (Signed)
We'll schedule breast studies for you as soon as possible We'll refer you to the vascular doctor We'll see what your labs today show If you have not heard anything from my staff in a week about any orders/referrals/studies from today, please contact us here to follow-up [phone] Return in 2 to 3 weeks for Medicare Wellness visit

## 2016-09-01 NOTE — Assessment & Plan Note (Signed)
Monitor sgpt

## 2016-09-02 LAB — HEMOGLOBIN A1C
HEMOGLOBIN A1C: 5.3 % (ref <5.7)
Mean Plasma Glucose: 105 mg/dL

## 2016-09-07 ENCOUNTER — Other Ambulatory Visit: Payer: Self-pay | Admitting: Family Medicine

## 2016-09-07 DIAGNOSIS — Z5181 Encounter for therapeutic drug level monitoring: Secondary | ICD-10-CM

## 2016-09-07 DIAGNOSIS — E785 Hyperlipidemia, unspecified: Secondary | ICD-10-CM

## 2016-09-07 MED ORDER — ATORVASTATIN CALCIUM 40 MG PO TABS: 40.0000 mg | ORAL_TABLET | Freq: Every day | ORAL | 1 refills | Status: DC

## 2016-09-07 NOTE — Assessment & Plan Note (Signed)
Increase statin, recheck labs in 6 weeks

## 2016-09-07 NOTE — Assessment & Plan Note (Signed)
Recheck labs in 6 weeks.  

## 2016-09-07 NOTE — Progress Notes (Signed)
Patient to increase statin; recheck lipids in 6 weeks

## 2016-09-18 ENCOUNTER — Other Ambulatory Visit: Payer: Self-pay

## 2016-09-18 ENCOUNTER — Ambulatory Visit (INDEPENDENT_AMBULATORY_CARE_PROVIDER_SITE_OTHER): Payer: Medicare Other | Admitting: Family Medicine

## 2016-09-18 ENCOUNTER — Encounter: Payer: Self-pay | Admitting: Family Medicine

## 2016-09-18 VITALS — BP 134/80 | HR 97 | Temp 98.3°F | Resp 16 | Ht 62.2 in | Wt 171.8 lb

## 2016-09-18 DIAGNOSIS — Z0000 Encounter for general adult medical examination without abnormal findings: Secondary | ICD-10-CM | POA: Insufficient documentation

## 2016-09-18 DIAGNOSIS — Z5181 Encounter for therapeutic drug level monitoring: Secondary | ICD-10-CM

## 2016-09-18 DIAGNOSIS — E785 Hyperlipidemia, unspecified: Secondary | ICD-10-CM

## 2016-09-18 DIAGNOSIS — Z13820 Encounter for screening for osteoporosis: Secondary | ICD-10-CM | POA: Diagnosis not present

## 2016-09-18 DIAGNOSIS — Z114 Encounter for screening for human immunodeficiency virus [HIV]: Secondary | ICD-10-CM | POA: Diagnosis not present

## 2016-09-18 DIAGNOSIS — Z780 Asymptomatic menopausal state: Secondary | ICD-10-CM | POA: Insufficient documentation

## 2016-09-18 HISTORY — DX: Asymptomatic menopausal state: Z78.0

## 2016-09-18 MED ORDER — FLUTICASONE PROPIONATE 50 MCG/ACT NA SUSP: 2.0000 | Freq: Every day | NASAL | 11 refills | Status: DC

## 2016-09-18 NOTE — Progress Notes (Signed)
Patient: Maria Patterson, Female    DOB: 1941-06-17, 76 y.o.   MRN: [id number]  Visit Date: 09/18/2016  Today's Provider: Enid Derry, MD   Chief Complaint  Patient presents with  . Annual Exam    Medicare wellness    Subjective:   Maria Patterson is a 76 y.o. female who presents today for her Subsequent Annual Wellness Visit.  Caregiver input:  n/a USPSTF grade A and B recommendations Alcohol: no alcohol Depression:  Depression screen [facility] 2/9 09/18/2016 09/01/2016 03/09/2016 09/10/2015  Decreased Interest 0 0 0 0  Down, Depressed, Hopeless 0 0 1 0  PHQ - 2 Score 0 0 1 0  Hypertension: well-controlled Obesity: discussed Tobacco use: never HIV, hep B, hep C: okay for screen STD testing and prevention (chl/gon/syphilis): no Lipids: last lipids reviewed; just started chol medicine Glucose: normal Colorectal cancer: aged out; last colonoscopy around 2014, 2015; Dr. Vira Agar Breast cancer: she has appt scheduled for next week Intimate partner violence: no abuse Cervical cancer screening:  Lung cancer: n/a Osteoporosis: years ago Fall prevention/vitamin D:  AAA: n/a Aspirin: taking 81 mg daily aspirin Diet: tries to watch her diet Exercise: no regular exercise Skin cancer: no worrisome moles  HPI  Review of Systems   Past Medical History:  Diagnosis Date  . GERD (gastroesophageal reflux disease)   . Hyperlipidemia   . Obesity    Past Surgical History:  Procedure Laterality Date  . ABDOMINAL HYSTERECTOMY    . ESOPHAGOGASTRODUODENOSCOPY (EGD) WITH PROPOFOL N/A 12/24/2014   Procedure: ESOPHAGOGASTRODUODENOSCOPY (EGD) WITH PROPOFOL;  Surgeon: Manya Silvas, MD;  Location: [facility] ENDOSCOPY;  Service: Endoscopy;  Laterality: N/A;  . TONSILECTOMY, ADENOIDECTOMY, BILATERAL MYRINGOTOMY AND TUBES     Family History  Problem Relation Age of Onset  . Stroke Mother   . Stroke Father   . Cancer Neg Hx   . Depression Neg Hx    Social History   Social History  . Marital status:  Widowed    Spouse name: N/A  . Number of children: N/A  . Years of education: N/A   Occupational History  . Not on file.   Social History Main Topics  . Smoking status: Never Smoker  . Smokeless tobacco: Never Used  . Alcohol use Not on file  . Drug use: No  . Sexual activity: Not on file   Other Topics Concern  . Not on file   Social History Narrative  . No narrative on file   Outpatient Encounter Prescriptions as of 09/18/2016  Medication Sig  . aspirin 81 MG tablet Take 81 mg by mouth daily.  Marland Kitchen atorvastatin (LIPITOR) 40 MG tablet Take 1 tablet (40 mg total) by mouth at bedtime.  . Cholecalciferol (VITAMIN D) 2000 units tablet One by mouth twice a week  . ferrous sulfate 325 (65 FE) MG EC tablet One by mouth twice a week  . Multiple Vitamin (MULTIVITAMIN WITH MINERALS) TABS tablet One by mouth twice a week  . omeprazole (PRILOSEC) 20 MG capsule Take 20 mg by mouth daily as needed.   No facility-administered encounter medications on file as of 09/18/2016.    Functional Ability / Safety Screening 1.  Was the timed Get Up and Go test longer than 30 seconds?  no 2.  Does the patient need help with the phone, transportation, shopping,      preparing meals, housework, laundry, medications, or managing money?  no 3.  Does the patient's home have:  loose throw rugs in the hallway?  no      Grab bars in the bathroom? no      Handrails on the stairs?   yes      Poor lighting?   no 4.  Has the patient noticed any hearing difficulties?   no  Fall Risk Assessment See under rooming  Depression Screen See under rooming Depression screen [facility] 2/9 09/18/2016 09/01/2016 03/09/2016 09/10/2015  Decreased Interest 0 0 0 0  Down, Depressed, Hopeless 0 0 1 0  PHQ - 2 Score 0 0 1 0   Advanced Directives Does patient have a HCPOA?    no If yes, name and contact information:  Does patient have a living will or MOST form?  no  Objective:   Vitals: BP 134/80   Pulse 97   Temp 98.3 F (36.8  C) (Oral)   Resp 16   Ht 5' 2.2" (1.58 m)   Wt 171 lb 12.8 oz (77.9 kg)   SpO2 96%   BMI 31.22 kg/m  Body mass index is 31.22 kg/m. No exam data present  Physical Exam  Constitutional: She appears well-developed and well-nourished. No distress.  HENT:  Left Ear: Tympanic membrane is retracted.  Nose: No rhinorrhea.  Mouth/Throat: Oropharynx is clear and moist.  Cardiovascular: Normal rate.   Pulmonary/Chest: Effort normal.   Mood/affect:  euthymic Appearance:  Neatly dressed  6CIT Screen 09/18/2016  What Year? 0 points  What month? 0 points  What time? 0 points  Count back from 20 0 points  Months in reverse 0 points  Repeat phrase 2 points  Total Score 2    Assessment & Plan:     Annual Wellness Visit  Reviewed patient's Family Medical History Reviewed and updated list of patient's medical providers Assessment of cognitive impairment was done Assessed patient's functional ability Established a written schedule for health screening [place] Completed and Reviewed  Exercise Activities and Dietary recommendations Goals    None    increase activity; lose 10 pounds  Immunization History  Administered Date(s) Administered  . Influenza, High Dose Seasonal PF 09/10/2015, 09/01/2016  . Pneumococcal Conjugate-13 09/10/2015   Health Maintenance  Topic Date Due  . TETANUS/TDAP  07/06/1958  . PNA vac Low Risk Adult (2 of 2 - PPSV23) 09/09/2016  . INFLUENZA VACCINE  Completed  . DEXA SCAN  Completed   Discussed health benefits of physical activity, and encouraged her to engage in regular exercise appropriate for her age and condition.   Problem List Items Addressed This Visit      Other   Preventative health care - Primary    USPSTF grade A and B recommendations reviewed with patient; age-appropriate recommendations, preventive care, screening tests, etc discussed and encouraged; healthy living encouraged; see AVS for patient education  given to patient       Postmenopausal    Screen for osteoporosis      Osteoporosis screening    DEXA scan ordered; encouraged three servings of calcium a day; 1000 iu vitamin D3 daily      Relevant Orders   DG Bone Density   Encounter for screening for HIV   Relevant Orders   HIV antibody       No orders of the defined types were placed in this encounter.   Current Outpatient Prescriptions:  .  aspirin 81 MG tablet, Take 81 mg by mouth daily., Disp: , Rfl:  .  atorvastatin (LIPITOR) 40 MG tablet, Take 1 tablet (40 mg total) by mouth at bedtime.,  Disp: 30 tablet, Rfl: 1 .  Cholecalciferol (VITAMIN D) 2000 units tablet, One by mouth twice a week, Disp: , Rfl:  .  ferrous sulfate 325 (65 FE) MG EC tablet, One by mouth twice a week, Disp: , Rfl:  .  Multiple Vitamin (MULTIVITAMIN WITH MINERALS) TABS tablet, One by mouth twice a week, Disp: , Rfl:  .  omeprazole (PRILOSEC) 20 MG capsule, Take 20 mg by mouth daily as needed., Disp: , Rfl:  There are no discontinued medications.  Next Medicare Wellness Visit in 12+ months

## 2016-09-18 NOTE — Assessment & Plan Note (Signed)
USPSTF grade A and B recommendations reviewed with patient; age-appropriate recommendations, preventive care, screening tests, etc discussed and encouraged; healthy living encouraged; see AVS for patient education given to patient  

## 2016-09-18 NOTE — Assessment & Plan Note (Signed)
Screen for osteoporosis. 

## 2016-09-18 NOTE — Patient Instructions (Addendum)
Work on 10 pounds of weight loss over the next several months Try to increase your activity and build up gradually to 150 minutes of walking a week Return on or just after October 19, 2016 for fasting labs Start the new nasal spray  Health Maintenance  Topic Date Due  . Samul Dada  07/06/1960  . PNA vac Low Risk Adult (2 of 2 - PPSV23) 09/09/2016  . INFLUENZA VACCINE  Completed  . DEXA SCAN  Completed  Please do call to schedule your bone density study; the number to schedule one at either [facility] or [facility] is [phone] If you step on something like a nail or cut yourself, please come in for a tetanus booster Try to get 3 servings of calcium a day Take 1,000 iu of vitamin D3 once a day   Health Maintenance, Female Adopting a healthy lifestyle and getting preventive care can go a long way to promote health and wellness. Talk with your health care provider about what schedule of regular examinations is right for you. This is a good chance for you to check in with your provider about disease prevention and staying healthy. In between checkups, there are plenty of things you can do on your own. Experts have done a lot of research about which lifestyle changes and preventive measures are most likely to keep you healthy. Ask your health care provider for more information. Weight and diet Eat a healthy diet  Be sure to include plenty of vegetables, fruits, low-fat dairy products, and lean protein.  Do not eat a lot of foods high in solid fats, added sugars, or salt.  Get regular exercise. This is one of the most important things you can do for your health.  Most adults should exercise for at least 150 minutes each week. The exercise should increase your heart rate and make you sweat (moderate-intensity exercise).  Most adults should also do strengthening exercises at least twice a week. This is in addition to the moderate-intensity exercise. Maintain a  healthy weight  Body mass index (BMI) is a measurement that can be used to identify possible weight problems. It estimates body fat based on height and weight. Your health care provider can help determine your BMI and help you achieve or maintain a healthy weight.  For females 28 years of age and older:  A BMI below 18.5 is considered underweight.  A BMI of 18.5 to 24.9 is normal.  A BMI of 25 to 29.9 is considered overweight.  A BMI of 30 and above is considered obese. Watch levels of cholesterol and blood lipids  You should start having your blood tested for lipids and cholesterol at 78 years of age, then have this test every 5 years.  You may need to have your cholesterol levels checked more often if:  Your lipid or cholesterol levels are high.  You are older than 78 years of age.  You are at high risk for heart disease. Cancer screening Lung Cancer  Lung cancer screening is recommended for adults 68-64 years old who are at high risk for lung cancer because of a history of smoking.  A yearly low-dose CT scan of the lungs is recommended for people who:  Currently smoke.  Have quit within the past 15 years.  Have at least a 30-pack-year history of smoking. A pack year is smoking an average of one pack of cigarettes a day for 1 year.  Yearly screening should continue until it has  been 15 years since you quit.  Yearly screening should stop if you develop a health problem that would prevent you from having lung cancer treatment. Breast Cancer  Practice breast self-awareness. This means understanding how your breasts normally appear and feel.  It also means doing regular breast self-exams. Let your health care provider know about any changes, no matter how small.  If you are in your 20s or 30s, you should have a clinical breast exam (CBE) by a health care provider every 1-3 years as part of a regular health exam.  If you are 42 or older, have a CBE every year. Also  consider having a breast X-ray (mammogram) every year.  If you have a family history of breast cancer, talk to your health care provider about genetic screening.  If you are at high risk for breast cancer, talk to your health care provider about having an MRI and a mammogram every year.  Breast cancer gene (BRCA) assessment is recommended for women who have family members with BRCA-related cancers. BRCA-related cancers include:  Breast.  Ovarian.  Tubal.  Peritoneal cancers.  Results of the assessment will determine the need for genetic counseling and BRCA1 and BRCA2 testing. Cervical Cancer  Your health care provider may recommend that you be screened regularly for cancer of the pelvic organs (ovaries, uterus, and vagina). This screening involves a pelvic examination, including checking for microscopic changes to the surface of your cervix (Pap test). You may be encouraged to have this screening done every 3 years, beginning at age 79.  For women ages 39-65, health care providers may recommend pelvic exams and Pap testing every 3 years, or they may recommend the Pap and pelvic exam, combined with testing for human papilloma virus (HPV), every 5 years. Some types of HPV increase your risk of cervical cancer. Testing for HPV may also be done on women of any age with unclear Pap test results.  Other health care providers may not recommend any screening for nonpregnant women who are considered low risk for pelvic cancer and who do not have symptoms. Ask your health care provider if a screening pelvic exam is right for you.  If you have had past treatment for cervical cancer or a condition that could lead to cancer, you need Pap tests and screening for cancer for at least 20 years after your treatment. If Pap tests have been discontinued, your risk factors (such as having a new sexual partner) need to be reassessed to determine if screening should resume. Some women have medical problems that  increase the chance of getting cervical cancer. In these cases, your health care provider may recommend more frequent screening and Pap tests. Colorectal Cancer  This type of cancer can be detected and often prevented.  Routine colorectal cancer screening usually begins at 78 years of age and continues through 78 years of age.  Your health care provider may recommend screening at an earlier age if you have risk factors for colon cancer.  Your health care provider may also recommend using home test kits to check for hidden blood in the stool.  A small camera at the end of a tube can be used to examine your colon directly (sigmoidoscopy or colonoscopy). This is done to check for the earliest forms of colorectal cancer.  Routine screening usually begins at age 33.  Direct examination of the colon should be repeated every 5-10 years through 78 years of age. However, you may need to be screened more often  if early forms of precancerous polyps or small growths are found. Skin Cancer  Check your skin from head to toe regularly.  Tell your health care provider about any new moles or changes in moles, especially if there is a change in a mole's shape or color.  Also tell your health care provider if you have a mole that is larger than the size of a pencil eraser.  Always use sunscreen. Apply sunscreen liberally and repeatedly throughout the day.  Protect yourself by wearing long sleeves, pants, a wide-brimmed hat, and sunglasses whenever you are outside. Heart disease, diabetes, and high blood pressure  High blood pressure causes heart disease and increases the risk of stroke. High blood pressure is more likely to develop in:  People who have blood pressure in the high end of the normal range (130-139/85-89 mm Hg).  People who are overweight or obese.  People who are African American.  If you are 38-59 years of age, have your blood pressure checked every 3-5 years. If you are 51 years of  age or older, have your blood pressure checked every year. You should have your blood pressure measured twice-once when you are at a hospital or clinic, and once when you are not at a hospital or clinic. Record the average of the two measurements. To check your blood pressure when you are not at a hospital or clinic, you can use:  An automated blood pressure machine at a pharmacy.  A home blood pressure monitor.  If you are between 31 years and 30 years old, ask your health care provider if you should take aspirin to prevent strokes.  Have regular diabetes screenings. This involves taking a blood sample to check your fasting blood sugar level.  If you are at a normal weight and have a low risk for diabetes, have this test once every three years after 78 years of age.  If you are overweight and have a high risk for diabetes, consider being tested at a younger age or more often. Preventing infection Hepatitis B  If you have a higher risk for hepatitis B, you should be screened for this virus. You are considered at high risk for hepatitis B if:  You were born in a country where hepatitis B is common. Ask your health care provider which countries are considered high risk.  Your parents were born in a high-risk country, and you have not been immunized against hepatitis B (hepatitis B vaccine).  You have HIV or AIDS.  You use needles to inject street drugs.  You live with someone who has hepatitis B.  You have had sex with someone who has hepatitis B.  You get hemodialysis treatment.  You take certain medicines for conditions, including cancer, organ transplantation, and autoimmune conditions. Hepatitis C  Blood testing is recommended for:  Everyone born from [birth year] through 1967.  Anyone with known risk factors for hepatitis C. Sexually transmitted infections (STIs)  You should be screened for sexually transmitted infections (STIs) including gonorrhea and chlamydia if:  You are  sexually active and are younger than 78 years of age.  You are older than 78 years of age and your health care provider tells you that you are at risk for this type of infection.  Your sexual activity has changed since you were last screened and you are at an increased risk for chlamydia or gonorrhea. Ask your health care provider if you are at risk.  If you do not have HIV, but are  at risk, it may be recommended that you take a prescription medicine daily to prevent HIV infection. This is called pre-exposure prophylaxis (PrEP). You are considered at risk if:  You are sexually active and do not regularly use condoms or know the HIV status of your partner(s).  You take drugs by injection.  You are sexually active with a partner who has HIV. Talk with your health care provider about whether you are at high risk of being infected with HIV. If you choose to begin PrEP, you should first be tested for HIV. You should then be tested every 3 months for as long as you are taking PrEP. Pregnancy  If you are premenopausal and you may become pregnant, ask your health care provider about preconception counseling.  If you may become pregnant, take 400 to 800 micrograms (mcg) of folic acid every day.  If you want to prevent pregnancy, talk to your health care provider about birth control (contraception). Osteoporosis and menopause  Osteoporosis is a disease in which the bones lose minerals and strength with aging. This can result in serious bone fractures. Your risk for osteoporosis can be identified using a bone density scan.  If you are 71 years of age or older, or if you are at risk for osteoporosis and fractures, ask your health care provider if you should be screened.  Ask your health care provider whether you should take a calcium or vitamin D supplement to lower your risk for osteoporosis.  Menopause may have certain physical symptoms and risks.  Hormone replacement therapy may reduce some of  these symptoms and risks. Talk to your health care provider about whether hormone replacement therapy is right for you. Follow these instructions at home:  Schedule regular health, dental, and eye exams.  Stay current with your immunizations.  Do not use any tobacco products including cigarettes, chewing tobacco, or electronic cigarettes.  If you are pregnant, do not drink alcohol.  If you are breastfeeding, limit how much and how often you drink alcohol.  Limit alcohol intake to no more than 1 drink per day for nonpregnant women. One drink equals 12 ounces of beer, 5 ounces of wine, or 1 ounces of hard liquor.  Do not use street drugs.  Do not share needles.  Ask your health care provider for help if you need support or information about quitting drugs.  Tell your health care provider if you often feel depressed.  Tell your health care provider if you have ever been abused or do not feel safe at home. This information is not intended to replace advice given to you by your health care provider. Make sure you discuss any questions you have with your health care provider. Document Released: 01/19/2011 Document Revised: 12/12/2015 Document Reviewed: 04/09/2015 Elsevier Interactive Patient Education  2017 [employer].

## 2016-09-18 NOTE — Assessment & Plan Note (Signed)
DEXA scan ordered; encouraged three servings of calcium a day; 1000 iu vitamin D3 daily

## 2016-09-22 ENCOUNTER — Ambulatory Visit
Admission: RE | Admit: 2016-09-22 | Discharge: 2016-09-22 | Disposition: A | Discharge status: Home / Self Care | Payer: Medicare Other | Source: Ambulatory Visit | Attending: Family Medicine | Admitting: Family Medicine

## 2016-09-22 ENCOUNTER — Telehealth (INDEPENDENT_AMBULATORY_CARE_PROVIDER_SITE_OTHER): Payer: Medicare Other | Admitting: Family Medicine

## 2016-09-22 ENCOUNTER — Other Ambulatory Visit: Payer: Self-pay | Admitting: Family Medicine

## 2016-09-22 ENCOUNTER — Telehealth: Payer: Self-pay | Admitting: Family Medicine

## 2016-09-22 ENCOUNTER — Encounter: Payer: Self-pay | Admitting: Family Medicine

## 2016-09-22 ENCOUNTER — Ambulatory Visit: Admission: RE | Admit: 2016-09-22 | Payer: Medicare Other | Source: Ambulatory Visit

## 2016-09-22 DIAGNOSIS — N6320 Unspecified lump in the left breast, unspecified quadrant: Secondary | ICD-10-CM | POA: Diagnosis not present

## 2016-09-22 DIAGNOSIS — N63 Unspecified lump in unspecified breast: Secondary | ICD-10-CM | POA: Diagnosis present

## 2016-09-22 DIAGNOSIS — N6321 Unspecified lump in the left breast, upper outer quadrant: Secondary | ICD-10-CM | POA: Diagnosis not present

## 2016-09-22 HISTORY — DX: Unspecified lump in the left breast, unspecified quadrant: N63.20

## 2016-09-22 NOTE — Telephone Encounter (Signed)
I saw the mammogram report and called the patient to discuss They are going to schedule her biopsy she says No family history I am here for her as she goes through this

## 2016-09-22 NOTE — Telephone Encounter (Signed)
-----   Message from Maria Patterson sent at 09/22/2016  1:57 PM EST ----- PATIENT DOESN'T WANT TO SCHEDULE AN APPOINTMENT AT THIS TIME.SHE WISHES TO WAIT FOR THE BX TO BE DONE AT [place].

## 2016-09-22 NOTE — Telephone Encounter (Signed)
Thank you :)

## 2016-09-22 NOTE — Assessment & Plan Note (Signed)
Spoke with patient; referral entered to general surgeon and notify breast cancer navigator, biopsy to be scheduled through radiology per patient

## 2016-09-30 ENCOUNTER — Ambulatory Visit
Admission: RE | Admit: 2016-09-30 | Discharge: 2016-09-30 | Disposition: A | Discharge status: Home / Self Care | Payer: Medicare Other | Source: Ambulatory Visit | Attending: Family Medicine | Admitting: Family Medicine

## 2016-09-30 DIAGNOSIS — C50912 Malignant neoplasm of unspecified site of left female breast: Secondary | ICD-10-CM | POA: Diagnosis not present

## 2016-09-30 DIAGNOSIS — N6320 Unspecified lump in the left breast, unspecified quadrant: Secondary | ICD-10-CM

## 2016-09-30 DIAGNOSIS — C50412 Malignant neoplasm of upper-outer quadrant of left female breast: Secondary | ICD-10-CM | POA: Diagnosis not present

## 2016-09-30 DIAGNOSIS — R590 Localized enlarged lymph nodes: Secondary | ICD-10-CM | POA: Diagnosis not present

## 2016-09-30 DIAGNOSIS — N6321 Unspecified lump in the left breast, upper outer quadrant: Secondary | ICD-10-CM | POA: Diagnosis not present

## 2016-09-30 DIAGNOSIS — C773 Secondary and unspecified malignant neoplasm of axilla and upper limb lymph nodes: Secondary | ICD-10-CM | POA: Insufficient documentation

## 2016-09-30 HISTORY — PX: BREAST BIOPSY: SHX20

## 2016-10-01 NOTE — Progress Notes (Signed)
  Oncology Nurse Navigator Documentation  Navigator Location: [facility] (09/23/16 1100)   )Navigator Encounter Type: Introductory phone call (09/23/16 1100)   Abnormal Finding Date: 09/22/16 (09/23/16 1100)                 Patient Visit Type: Initial (09/23/16 1100)                              Time Spent with Patient: 15 (09/23/16 1100)   Received Navigator referral from Dr. Sanda Klein.  Phoned patient , and introduced Navigation service.  Explained that patient should receive notification of biopsy appointment from the [facility].  She is to call back if she does not receive this appointment info.  Notified [facility] that patient is expecting biopsy appointment.

## 2016-10-02 ENCOUNTER — Encounter (INDEPENDENT_AMBULATORY_CARE_PROVIDER_SITE_OTHER): Payer: Medicare Other | Admitting: Vascular Surgery

## 2016-10-02 ENCOUNTER — Telehealth: Payer: Self-pay | Admitting: Family Medicine

## 2016-10-02 NOTE — Telephone Encounter (Signed)
Please see if the other referrals already entered are enough (for insurance purposes); appointments have already been made Thank you  ------------------

## 2016-10-02 NOTE — Progress Notes (Signed)
  Oncology Nurse Navigator Documentation  Navigator Location: [facility] (10/02/16 1400) Referral date to RadOnc/MedOnc: 10/13/16 (10/02/16 1400) )Navigator Encounter Type: Telephone (10/02/16 1400)     Confirmed Diagnosis Date: 09/30/16 (10/02/16 1400)               Patient Visit Type: Initial (10/02/16 1400) Treatment Phase: Pre-Tx/Tx Discussion (10/02/16 1400) Barriers/Navigation Needs: Education;Coordination of Care (10/02/16 1400) Education: Accessing Care/ Finding Providers;Coping with Diagnosis/ Prognosis;Newly Diagnosed Cancer Education (10/02/16 1400) Interventions: Coordination of Care;Referrals (10/02/16 1400)                      Time Spent with Patient: 45 (10/02/16 1400)  Radiologist notified patient of biopsy results.  Phoned patient to schedule surgical consult on 10/08/16 with Dr. Adonis Huguenin, and Med Adella Nissen consult with Dr. Grayland Ormond on 10/13/16 .  Mailed appointment info.  Notified Primary physician, Dr. Sanda Klein of  Scheduled consults.

## 2016-10-02 NOTE — Telephone Encounter (Signed)
-----   Message from Theodore Demark, RN sent at 10/02/2016  2:17 PM EDT ----- Regarding: Consults scheduled Dr. Sanda Klein, The radiologist has notified [name] of her positive biopsy results.  I have scheduled a Surgical consult with Dr.Woodham at [facility] for 10/08/16 at 10:00, and a Medical Oncology consult with Dr. Grayland Ormond 10/13/16 at 8:30.  Patient is aware of appointments.  Please have you office send referrals if necessary. Thank you! Al Pimple Nurse Navigator

## 2016-10-05 ENCOUNTER — Other Ambulatory Visit: Payer: Self-pay

## 2016-10-05 DIAGNOSIS — N6320 Unspecified lump in the left breast, unspecified quadrant: Secondary | ICD-10-CM

## 2016-10-05 NOTE — Telephone Encounter (Signed)
I spoke with [facility] and they did confirm that a referral was needed. New referral was placed  And sent over for general surgery.

## 2016-10-06 LAB — SURGICAL PATHOLOGY

## 2016-10-08 ENCOUNTER — Encounter: Payer: Self-pay | Admitting: Surgery

## 2016-10-08 ENCOUNTER — Ambulatory Visit (INDEPENDENT_AMBULATORY_CARE_PROVIDER_SITE_OTHER): Payer: Medicare Other | Admitting: Surgery

## 2016-10-08 VITALS — BP 154/77 | HR 72 | Temp 98.3°F | Ht 62.0 in | Wt 175.0 lb

## 2016-10-08 DIAGNOSIS — C50412 Malignant neoplasm of upper-outer quadrant of left female breast: Secondary | ICD-10-CM | POA: Diagnosis not present

## 2016-10-08 NOTE — Progress Notes (Signed)
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  History of Present Illness: Maria Patterson is a 76 y.o. female referred by the patient's PCP Dr. Sanda Klein for new diagnosis of left breast cancer. The patient noted a little over a month ago a mass over the lateral side of her left breast. She saw her PCP on 2/13 at which time an ultrasound and mammogram were ordered. The patient reports that she was not performing any self breast exams and her last mammogram had been in May 2013. She reports that she stopped her mammograms although does not specify a reason.  Her mammogram was done on 3/6 as well as an ultrasound which noted highly suspicious findings with the angulated spiculated mass of the left breast at 2:00 2 cm from the nipple measuring 2.3 x 2.9 x 2.2 cm as well as an irregular hypoechoic mass of the left breast at 2:00 5 cm from the nipple measuring 0.5 x 0.6 x 0.6 cm. There are also multiple abnormal thick cortex lymph nodes within the left axilla. 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 She now presents for further evaluation and potential management. She is set to see Dr. Grayland Ormond with oncology next week.  She denies any fevers, chills, chest pain, bony pain, shortness of breath. She denies any pain over the breast prior to the biopsy but did report feeling a lump as well as an area of discoloration on the lateral portion of the left breast. There is no drainage that she noted. No other skin changes. No nipple changes. Denies any abdominal pain or jaundice, nausea, vomiting, blood in the stools.  Past Medical History: Past Medical History:  Diagnosis Date  . Breast mass, left 09/22/2016   RECOMMENDATION: Ultrasound-guided core biopsies of masses at left breast 2 o'clock 2 cm from nipple, left  breast 2 o'clock 5 cm from nipple, abnormal left axillary lymph nodes.  Marland Kitchen GERD (gastroesophageal reflux disease)   . Hyperglycemia   . Hyperlipidemia   . Obesity   . Postmenopausal      Past Surgical History: Past Surgical History:  Procedure Laterality Date  . ABDOMINAL HYSTERECTOMY    . BREAST BIOPSY Left 09/30/2016   US biopsy of 3 areas, awaiting pathology  . ESOPHAGOGASTRODUODENOSCOPY (EGD) WITH PROPOFOL N/A 12/24/2014   Procedure: ESOPHAGOGASTRODUODENOSCOPY (EGD) WITH PROPOFOL;  Surgeon: Manya Silvas, MD;  Location: [facility] ENDOSCOPY;  Service: Endoscopy;  Laterality: N/A;  . TONSILECTOMY, ADENOIDECTOMY, BILATERAL MYRINGOTOMY AND TUBES      Home Medications: Prior to Admission medications   Medication Sig Start Date End Date Taking? Authorizing Provider  aspirin 81 MG tablet Take 81 mg by mouth daily.   Yes Historical Provider, MD  atorvastatin (LIPITOR) 40 MG tablet Take 1 tablet (40 mg total) by mouth at bedtime. 09/07/16  Yes Arnetha Courser, MD  Cholecalciferol (VITAMIN D) 2000 units tablet One by mouth twice a week 09/01/16  Yes Arnetha Courser, MD  ferrous sulfate 325 (65 FE) MG EC tablet One by mouth twice a week 09/01/16  Yes Arnetha Courser, MD  fluticasone (FLONASE) 50 MCG/ACT nasal spray Place 2 sprays into both nostrils daily. 09/18/16  Yes Arnetha Courser, MD  Multiple Vitamin (MULTIVITAMIN WITH MINERALS) TABS tablet One by mouth twice a week 09/01/16  Yes Arnetha Courser, MD  omeprazole (  PRILOSEC) 20 MG capsule Take 20 mg by mouth daily as needed.   Yes Historical Provider, MD    Allergies: Allergies  Allergen Reactions  . Latex Itching  . Penicillins Rash    Social History:  reports that she has never smoked. She has never used smokeless tobacco. She reports that she does not drink alcohol or use drugs.   Family History: Family History  Problem Relation Age of Onset  . Stroke Mother   . Stroke Father   . Cancer Neg Hx   . Depression Neg Hx     Review of  Systems: Review of Systems  Constitutional: Negative for chills and fever.  HENT: Negative for hearing loss.   Eyes: Negative for blurred vision.  Respiratory: Negative for cough and shortness of breath.   Cardiovascular: Negative for chest pain and leg swelling.  Gastrointestinal: Negative for abdominal pain, blood in stool, constipation, heartburn, nausea and vomiting.  Genitourinary: Negative for dysuria and hematuria.  Musculoskeletal: Negative for myalgias.  Skin: Negative for rash.       Reports skin discoloration similar to a burn on the lateral portio of left breast prior to biopsy.  Neurological: Negative for dizziness.  Psychiatric/Behavioral: Negative for depression.  All other systems reviewed and are negative.   Physical Exam BP (!) 154/77   Pulse 72   Temp 98.3 F (36.8 C) (Oral)   Ht '5\' 2"'  (1.575 m)   Wt 79.4 kg (175 lb)   BMI 32.01 kg/m  CONSTITUTIONAL: No acute distress HEENT:  Normocephalic, atraumatic, extraocular motion intact. NECK: Trachea is midline, and there is no jugular venous distension. LYMPH NODES:  Lymph nodes in the neck are not enlarged. No palpable lymphadenopathy over the left or right axilla. Left axillary biopsy site is clean and dry. RESPIRATORY:  Lungs are clear, and breath sounds are equal bilaterally. Normal respiratory effort without pathologic use of accessory muscles. CARDIOVASCULAR: Heart is regular without murmurs, gallops, or rubs. BREAST:  There are 2 palpable masses consistent with the patient's imaging studies in the left upper outer quadrant of the breast, with the larger mass being closer to the nipple. Is also an area of ecchymosis at the biopsy sites with small hematoma that is palpable as well. Otherwise biopsy sites are clean and dry with no evidence of infection. I do not see any skin discoloration as the patient had reported other than the ecchymosis. There is no nipple drainage or inversion. There are no palpable masses over  the right breast as well as any other findings. GI: The abdomen is soft, nondistended, nontender to palpation.  MUSCULOSKELETAL:  Normal muscle strength and tone in all four extremities.  No peripheral edema or cyanosis. SKIN: Skin turgor is normal. There are no pathologic skin lesions.  NEUROLOGIC:  Motor and sensation is grossly normal.  Cranial nerves are grossly intact. PSYCH:  Alert and oriented to person, place and time. Affect is normal.  Laboratory Analysis: Patient had laboratory workup done on 2/13 as part of her overall health maintenance. LFTs were normal. Creatinine is 1.14. Sodium 144, potassium 4.7, chloride 113, CO2 24, WBC 6.6, hemoglobin 12.4, platelet count 239.  Imaging: Mammogram and ultrasound were done on 3/6 which noted highly suspicious findings with an angulated spiculated mass of the left breast at 2:00 2 cm from the nipple measuring 2.3 x 2.9 x 2.2 cm as well as an irregular hypoechoic mass of the left breast at 2:00 5 cm from the nipple measuring 0.5 x 0.6 x  0.6 cm. There are also multiple abnormal thick cortex lymph nodes within the left axilla.  Assessment and Plan: This is a 76 y.o. female who presents with newly diagnosed left breast cancer of the upper outer quadrant that has spread to the axillary lymph nodes.  Discussed with the patient that currently we know that the mass has spread from the breast to the lymph nodes but currently uncertain as to further spread of disease. With that in mind, we'll order a CT scan of the chest and abdomen and pelvis to complete the staging workup. Discussed with the patient that if the cancer has spread beyond, then surgery would not have any particular role. The patient still due to see Dr. Grayland Ormond to discuss chemotherapy options. Based on her current disease there is potential for neoadjuvant chemotherapy. If that is the case then we would follow-up with the patient after chemotherapy is completed to discuss surgical options.  Discussed with the patient the need for possible mastectomy given the 2 masses that she has. There is potential for breast conserving surgery, but discussed with the patient that this may leave a deformity in the breast given its size and the 2 masses would need to be removed. Axillary lymph nodes would depend on whether she has chemotherapy prior or not.  We will follow-up with the patient pending Dr. Gary Fleet recommendations as well as the results from the CT scans ordered. If the patient requires a port for chemotherapy, we would be happy to set this up as well.  Face-to-face time spent with the patient and care providers was 45 minutes, with more than 50% of the time spent counseling, educating, and coordinating care of the patient.     Melvyn Neth, [place]

## 2016-10-08 NOTE — Progress Notes (Signed)
  Oncology Nurse Navigator Documentation  Navigator Location: [facility] (10/08/16 1700)   )Navigator Encounter Type: Clinic/[facility] (10/08/16 1700)                       Treatment Phase: Pre-Tx/Tx Discussion (10/08/16 1700) Barriers/Navigation Needs: Education (10/08/16 1700)   Interventions: Psycho-social support (10/08/16 1700)                      Time Spent with Patient: 30 (10/08/16 1700)  Met patient at [facility] to offer support.  She requested going over Breast Cancer Treatment Handbook at Oncology consult on 10/14/16.  Patient states she has not discussed diagnosis with family.

## 2016-10-08 NOTE — Patient Instructions (Addendum)
We have scheduled you for a CT Scan of your Chest, Abdomen and Pelvis for Thursday 10/15/16 at 8:00 AM [place]. In po 4 hours prior.  The outpatient imaging center is off of [employer]. The address to this location is: [address], [place], [place] [address]. The number to this location in case you are lost is, [phone].  You will need to pick up a prep kit at least 24 hours in advance of your Scan: You may pick this up at the [place] department at [facility], or [employer].  Bring a list of medications with you to your appointment.  If you have an allergy to IVP dye or CT contrast and this has not been addressed, please call our office [phone] and ask to speak with a nurse at least 48 hours in advance of your CT Scan.  Nothing to eat or drink 4 hours prior to your CT Scan.   If you need to reschedule your Scan, you may do so by calling [phone].

## 2016-10-11 NOTE — Progress Notes (Signed)
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  INTERVAL HISTORY: Patient is a 76 year old female who was noted to have an abnormality on routine screening mammogram. Subsequent ultrasound and biopsy revealed the above stated breast cancer. She is currently anxious, but otherwise feels well. She has no neurologic complaints. She denies any recent fevers or illnesses. She has a good appetite and denies weight loss. She has no chest pain or shortness of breath. She denies any nausea, vomiting, constipation, or diarrhea. She has no urinary complaints. Patient feels at her baseline and offers no further specific complaints today.  REVIEW OF SYSTEMS:   Review of Systems  Constitutional: Negative.  Negative for fever, malaise/fatigue and weight loss.  Respiratory: Negative.  Negative for cough.   Cardiovascular: Negative.  Negative for chest pain and leg swelling.  Gastrointestinal: Negative.  Negative for abdominal pain.  Genitourinary: Negative.   Musculoskeletal: Negative.   Neurological: Negative.  Negative for weakness.  Psychiatric/Behavioral: The patient is nervous/anxious.     As per HPI. Otherwise, a complete review of systems is negative.  PAST MEDICAL HISTORY: Past Medical History:  Diagnosis Date  . Breast mass, left 09/22/2016   RECOMMENDATION: Ultrasound-guided core biopsies of masses at left breast 2 o'clock 2 cm from nipple, left breast 2 o'clock 5 cm from nipple, abnormal left axillary lymph nodes.  Marland Kitchen GERD (gastroesophageal reflux disease)   . Hyperglycemia   . Hyperlipidemia   . Obesity   . Postmenopausal     PAST SURGICAL HISTORY: Past Surgical History:  Procedure  Laterality Date  . ABDOMINAL HYSTERECTOMY    . BREAST BIOPSY Left 09/30/2016   US biopsy of 3 areas, awaiting pathology  . ESOPHAGOGASTRODUODENOSCOPY (EGD) WITH PROPOFOL N/A 12/24/2014   Procedure: ESOPHAGOGASTRODUODENOSCOPY (EGD) WITH PROPOFOL;  Surgeon: Manya Silvas, MD;  Location: [facility] ENDOSCOPY;  Service: Endoscopy;  Laterality: N/A;  . TONSILECTOMY, ADENOIDECTOMY, BILATERAL MYRINGOTOMY AND TUBES      FAMILY HISTORY: Family History  Problem Relation Age of Onset  . Stroke Mother   . Stroke Father   . Cancer Neg Hx   . Depression Neg Hx     ADVANCED DIRECTIVES (Y/N):  N  HEALTH MAINTENANCE: Social History  Substance Use Topics  . Smoking status: Never Smoker  . Smokeless tobacco: Never Used  . Alcohol use No     Colonoscopy:  PAP:  Bone density:  Lipid panel:  Allergies  Allergen Reactions  . Latex Itching  . Penicillins Rash    Current Outpatient Prescriptions  Medication Sig Dispense Refill  . aspirin 81 MG tablet Take 81 mg by mouth daily.    Marland Kitchen atorvastatin (LIPITOR) 40 MG tablet Take 1 tablet (40 mg total) by mouth at bedtime. 30 tablet 1  . Cholecalciferol (VITAMIN D) 2000 units tablet One by mouth twice a week    . ferrous sulfate 325 (65 FE) MG EC tablet One by mouth twice a week    . Multiple Vitamin (MULTIVITAMIN WITH MINERALS) TABS tablet One by mouth twice a week    . omeprazole (PRILOSEC) 20 MG capsule Take 20 mg by mouth daily as needed.     No current facility-administered medications for this visit.     OBJECTIVE: Vitals:  10/13/16 0842  BP: (!) 154/72  Pulse: 78  Resp: 18  Temp: 97.6 F (36.4 C)     Body mass index is 31.64 kg/m.    ECOG FS:0 - Asymptomatic  General: Well-developed, well-nourished, no acute distress. Eyes: Pink conjunctiva, anicteric sclera. HEENT: Normocephalic, moist mucous membranes, clear oropharnyx. Breasts: Easily palpable left breast mass. Lungs: Clear to auscultation bilaterally. Heart: Regular rate and  rhythm. No rubs, murmurs, or gallops. Abdomen: Soft, nontender, nondistended. No organomegaly noted, normoactive bowel sounds. Musculoskeletal: No edema, cyanosis, or clubbing. Neuro: Alert, answering all questions appropriately. Cranial nerves grossly intact. Skin: No rashes or petechiae noted. Psych: Normal affect. Lymphatics: No cervical, calvicular, axillary or inguinal LAD.   LAB RESULTS:  Lab Results  Component Value Date   NA 144 09/01/2016   K 4.7 09/01/2016   CL 113 (H) 09/01/2016   CO2 24 09/01/2016   GLUCOSE 77 09/01/2016   BUN 23 09/01/2016   CREATININE 1.14 (H) 09/01/2016   CALCIUM 9.5 09/01/2016   PROT 6.8 09/01/2016   ALBUMIN 4.1 09/01/2016   AST 22 09/01/2016   ALT 20 09/01/2016   ALKPHOS 94 09/01/2016   BILITOT 0.5 09/01/2016   GFRNONAA 43 (L) 10/23/2015   GFRAA 50 (L) 10/23/2015    Lab Results  Component Value Date   WBC 6.6 09/01/2016   NEUTROABS 3,234 09/01/2016   HGB 12.4 09/01/2016   HCT 38.7 09/01/2016   MCV 89.8 09/01/2016   PLT 239 09/01/2016     STUDIES: Nm Cardiac Muga Rest  Result Date: 10/16/2016 CLINICAL DATA:  LEFT breast cancer, pretreatment assessment of cardiac function EXAM: NUCLEAR MEDICINE CARDIAC BLOOD POOL IMAGING (MUGA) TECHNIQUE: Cardiac multi-gated acquisition was performed at rest following intravenous injection of Tc-31mlabeled red blood cells. RADIOPHARMACEUTICALS:  20.72 mCi Tc-91mertechnetate in-vitro labeled autologous red blood cells IV COMPARISON:  None FINDINGS: LEFT ventricular ejection fraction is calculated at 65%, within the normal range. Study was obtained at a heart rate of 60 beats per minute. Cine analysis of the gated blood pool in 3 projections demonstrates normal LEFT ventricular wall motion. IMPRESSION: Normal LEFT ventricular ejection fraction 65%. Normal LV wall motion. Electronically Signed   By: MaLavonia Dana.D.   On: 10/16/2016 13:42   Nm Pet Image Initial (pi) Skull Base To Thigh  Result Date:  10/20/2016 CLINICAL DATA:  Initial treatment strategy for left upper outer quadrant breast carcinoma. EXAM: NUCLEAR MEDICINE PET SKULL BASE TO THIGH TECHNIQUE: 12.2 mCi F-18 FDG was injected intravenously. Full-ring PET imaging was performed from the skull base to thigh after the radiotracer. CT data was obtained and used for attenuation correction and anatomic localization. FASTING BLOOD GLUCOSE:  Value: 83 mg/dl COMPARISON:  AP CT on 03/01/2012 FINDINGS: NECK No hypermetabolic lymph nodes in the neck. CHEST 2.9 cm mass in the upper outer quadrant left breast is hypermetabolic, with SUV max of 22.2. This is consistent with known primary breast carcinoma. Hypermetabolic left axillary and subpectoral lymphadenopathy is seen. These measure less than 1 cm but have SUV max of 16.5, consistent with metastatic lymphadenopathy. No hypermetabolic mediastinal or hilar nodes. No suspicious pulmonary nodules on the CT scan. ABDOMEN/PELVIS No abnormal hypermetabolic activity within the liver, pancreas, adrenal glands, or spleen. Stable small benign right renal cysts. Shotty mesenteric lymph nodes are seen, largest measuring 8 mm on image 153/4 with SUV max of 4.7. No pathologically enlarged lymph nodes identified. Prior hysterectomy noted. Adnexal regions are unremarkable in appearance. Aortic atherosclerosis. SKELETON No focal hypermetabolic activity  to suggest skeletal metastasis. IMPRESSION: Hypermetabolic 3 cm mass in upper outer quadrant left breast, consistent with known primary breast carcinoma. Hypermetabolic left axillary and subpectoral lymphadenopathy, consistent with metastatic disease. Shotty less than 1 cm mesenteric lymph nodes with FDG uptake. Differential diagnosis includes inflammatory etiologies and small lymph node metastases. Recommend continued attention on follow-up CT. Aortic atherosclerosis. Electronically Signed   By: Earle Gell M.D.   On: 10/20/2016 13:23   US Breast Ltd Uni Left Inc  Axilla  Result Date: 09/22/2016 CLINICAL DATA:  Palpable lump left breast EXAM: 2D DIGITAL DIAGNOSTIC BILATERAL MAMMOGRAM WITH CAD AND ADJUNCT TOMO ULTRASOUND LEFT BREAST COMPARISON:  Previous exam(s). ACR Breast Density Category b: There are scattered areas of fibroglandular density. FINDINGS: Cc and MLO views of bilateral breasts, spot tangential view of left breast, spot compression right MLO views are submitted. There is a spiculated mass in the palpable area upper-outer quadrant left breast. A smaller mass is identified in the upper-outer quadrant left breast. There is abnormal skin thickening in the periareolar left breast and upper-outer quadrant left breast suspicious for skin involvement. There are abnormal enlarged lymph nodes in the left axilla. Images of the right breast demonstrate a questioned asymmetry in the lower right breast which does not persist on additional views. Mammographic images were processed with CAD. Targeted ultrasound is performed, showing a angulated spiculated mass at the left breast 2 o'clock 2 cm from nipple palpable area measuring 2.3 x 2.9 x 2.2 cm. At the left breast 2 o'clock 5 cm from nipple, there is a irregular border hypoechoic mass measuring 0.5 x 0.6 x 0.6 cm correlating to the smaller mass seen on mammogram. There are multiple abnormal thick cortex lymph nodes within the left axilla. IMPRESSION: Highly suspicious findings. RECOMMENDATION: Ultrasound-guided core biopsies of masses at left breast 2 o'clock 2 cm from nipple, left breast 2 o'clock 5 cm from nipple, abnormal left axillary lymph nodes. I have discussed the findings and recommendations with the patient. Results were also provided in writing at the conclusion of the visit. If applicable, a reminder letter will be sent to the patient regarding the next appointment. BI-RADS CATEGORY  5: Highly suggestive of malignancy. Electronically Signed   By: Abelardo Diesel M.D.   On: 09/22/2016 11:20   Mm Diag Breast Tomo  Bilateral  Result Date: 09/22/2016 CLINICAL DATA:  Palpable lump left breast EXAM: 2D DIGITAL DIAGNOSTIC BILATERAL MAMMOGRAM WITH CAD AND ADJUNCT TOMO ULTRASOUND LEFT BREAST COMPARISON:  Previous exam(s). ACR Breast Density Category b: There are scattered areas of fibroglandular density. FINDINGS: Cc and MLO views of bilateral breasts, spot tangential view of left breast, spot compression right MLO views are submitted. There is a spiculated mass in the palpable area upper-outer quadrant left breast. A smaller mass is identified in the upper-outer quadrant left breast. There is abnormal skin thickening in the periareolar left breast and upper-outer quadrant left breast suspicious for skin involvement. There are abnormal enlarged lymph nodes in the left axilla. Images of the right breast demonstrate a questioned asymmetry in the lower right breast which does not persist on additional views. Mammographic images were processed with CAD. Targeted ultrasound is performed, showing a angulated spiculated mass at the left breast 2 o'clock 2 cm from nipple palpable area measuring 2.3 x 2.9 x 2.2 cm. At the left breast 2 o'clock 5 cm from nipple, there is a irregular border hypoechoic mass measuring 0.5 x 0.6 x 0.6 cm correlating to the smaller mass seen on mammogram. There  are multiple abnormal thick cortex lymph nodes within the left axilla. IMPRESSION: Highly suspicious findings. RECOMMENDATION: Ultrasound-guided core biopsies of masses at left breast 2 o'clock 2 cm from nipple, left breast 2 o'clock 5 cm from nipple, abnormal left axillary lymph nodes. I have discussed the findings and recommendations with the patient. Results were also provided in writing at the conclusion of the visit. If applicable, a reminder letter will be sent to the patient regarding the next appointment. BI-RADS CATEGORY  5: Highly suggestive of malignancy. Electronically Signed   By: Abelardo Diesel M.D.   On: 09/22/2016 11:19   Mm Clip Placement  Left  Addendum Date: 10/01/2016   ADDENDUM REPORT: 10/01/2016 16:32 ADDENDUM: Pathology results: Pathology results from the ultrasound-guided biopsy of both left breast masses at the 2 o'clock position revealed invasive mammary carcinoma, grade 3. Pathology results from the ultrasound-guided biopsy of the left axillary lymph node revealed metastatic mammary carcinoma. This is concordant with the imaging findings. The patient has been notified of the results. She is doing well and denies any biopsy site complications. Referral to an oncologist is recommended. This will be arranged by the nurse navigator at the [facility]. The patient has been instructed to call the [facility] with any questions or concerns. Electronically Signed   By: Everlean Alstrom M.D.   On: 10/01/2016 16:32   Result Date: 10/01/2016 CLINICAL DATA:  Status post ultrasound-guided core biopsies of masses in the 2 o'clock region of the left breast 2 cm from the nipple and 5 cm from the nipple as well as an axillary lymph node. EXAM: DIAGNOSTIC LEFT MAMMOGRAM POST ULTRASOUND BIOPSIES COMPARISON:  Previous exam(s). FINDINGS: Mammographic images were obtained following ultrasound guided biopsies of masses in the 2 o'clock region of the left breast 2 cm from the nipple and 5 cm from the nipple as well as an axillary lymph node. Mammographic images showed there is a coil shaped clip associated with mass at 2 o'clock 2 cm from the nipple, a ribbon shaped clip associated with mass at 2 o'clock 5 cm from the nipple and HydroMARK clip in the left axilla. IMPRESSION: Status post ultrasound-guided core biopsies of masses in the left breast and left axilla with pathology pending. Final Assessment: Post Procedure Mammograms for Marker Placement Electronically Signed: By: Lillia Mountain M.D. On: 09/30/2016 09:25   [place] Lt Breast Bx W Loc Dev 1st Lesion Img Bx Spec US Guide  Result Date: 09/30/2016 CLINICAL DATA:  Suspicious masses in  the 2 o'clock region of the left breast 2 cm from the nipple and 5 cm from the nipple as well as axillary adenopathy. EXAM: ULTRASOUND GUIDED LEFT BREAST CORE NEEDLE BIOPSY COMPARISON:  Previous exam(s). PROCEDURE: I met with the patient and we discussed the procedure of ultrasound-guided biopsy, including benefits and alternatives. We discussed the high likelihood of a successful procedure. We discussed the risks of the procedure including infection, bleeding, tissue injury, clip migration, and inadequate sampling. Informed written consent was given. The usual time-out protocol was performed immediately prior to the procedure. Using sterile technique and 1% Lidocaine as local anesthetic, under direct ultrasound visualization, a 12 gauge vacuum-assisted device was used to perform biopsy of a mass in the 2 o'clock region of the left breast 2 cm from the nippleusing a lateral to medial approach. At the conclusion of the procedure, a coil shaped tissue marker clip was deployed into the biopsy cavity. Follow-up 2-view mammogram was performed and dictated separately. IMPRESSION: Ultrasound-guided biopsy  of a mass in the left breast at 2 o'clock 2 cm from the nipple. No apparent complications. Electronically Signed   By: Lillia Mountain M.D.   On: 09/30/2016 09:06   [place] Lt Breast Bx W Loc Dev Ea Add Lesion Img Bx Spec US Guide  Result Date: 09/30/2016 CLINICAL DATA:  Suspicious masses in the 2 o'clock region of the left breast at 2 o'clock 2 cm from the nipple and 5 cm from the nipple is well as axillary adenopathy. EXAM: ULTRASOUND GUIDED CORE NEEDLE BIOPSY OF A LEFT AXILLARY NODE COMPARISON:  Previous exam(s). FINDINGS: I met with the patient and we discussed the procedure of ultrasound-guided biopsy, including benefits and alternatives. We discussed the high likelihood of a successful procedure. We discussed the risks of the procedure, including infection, bleeding, tissue injury, clip migration, and inadequate  sampling. Informed written consent was given. The usual time-out protocol was performed immediately prior to the procedure. Using sterile technique and 1% Lidocaine as local anesthetic, under direct ultrasound visualization, a 14 gauge spring-loaded device was used to perform biopsy of enlarged left axillary lymph node using a lateral to medial approach. At the conclusion of the procedure a HydroMARK tissue marker clip was deployed into the biopsy cavity. Follow up 2 view mammogram was performed and dictated separately. IMPRESSION: Ultrasound guided biopsy of a left axillary lymph node. No apparent complications. Electronically Signed   By: Lillia Mountain M.D.   On: 09/30/2016 09:09   [place] Lt Breast Bx W Loc Dev Ea Add Lesion Img Bx Spec US Guide  Result Date: 09/30/2016 CLINICAL DATA:  Suspicious masses in the 2 o'clock region of the left breast at 2 o'clock 2 cm from the nipple and 5 cm from the nipple as well as axillary adenopathy. EXAM: ULTRASOUND GUIDED LEFT BREAST CORE NEEDLE BIOPSY COMPARISON:  Previous exam(s). PROCEDURE: I met with the patient and we discussed the procedure of ultrasound-guided biopsy, including benefits and alternatives. We discussed the high likelihood of a successful procedure. We discussed the risks of the procedure including infection, bleeding, tissue injury, clip migration, and inadequate sampling. Informed written consent was given. The usual time-out protocol was performed immediately prior to the procedure. Using sterile technique and 1% Lidocaine as local anesthetic, under direct ultrasound visualization, a 12 gauge vacuum-assisted device was used to perform biopsy of a mass in the 2 o'clock region of the left breast 5 cm from the nippleusing a lateral to medial approach. At the conclusion of the procedure, a ribbon shaped tissue marker clip was deployed into the biopsy cavity. Follow-up 2-view mammogram was performed and dictated separately. IMPRESSION: Ultrasound-guided biopsy  of a mass in the 2 o'clock region of the left breast 5 cm from the nipple. No apparent complications. Electronically Signed   By: Lillia Mountain M.D.   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  PLAN:    1. 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 Patient will then require surgery followed by adjuvant XRT. Patient will receive Herceptin only every 3 weeks for an entire year after her surgery. An aromatase inhibitor would not offer benefit given the ER/PR negativity of her disease. Prior to initiating treatment patient will require port placement. MUGA scan completed after patient's  clinic appointment adequate to proceed with an EF of 65%. PET scan results reviewed independently and reported as above with no obvious metastatic disease other than her known disease in breast and axilla. Return to clinic in approximately 2 weeks to initiate cycle 1 of 6 of treatment.   Approximately 45 minutes was spent in discussion of which greater than 50% was consultation.  Patient expressed understanding and was in agreement with this plan. She also understands that She can call clinic at any time with any questions, concerns, or complaints.   Cancer Staging Malignant neoplasm of upper-outer quadrant of left female breast [facility]) Staging form: Breast, AJCC 8th Edition - Clinical stage from 10/11/2016: Stage IIB (cT2, cN1, cM0, G3, ER: Negative, PR: Negative, HER2: Positive) - Signed by Lloyd Huger, MD on 10/11/2016   Lloyd Huger, MD   10/20/2016 2:17 PM

## 2016-10-13 ENCOUNTER — Encounter: Payer: Self-pay | Admitting: Oncology

## 2016-10-13 ENCOUNTER — Telehealth: Payer: Self-pay

## 2016-10-13 ENCOUNTER — Inpatient Hospital Stay: Payer: Medicare Other | Attending: Oncology | Admitting: Oncology

## 2016-10-13 VITALS — BP 154/72 | HR 78 | Temp 97.6°F | Resp 18 | Wt 173.0 lb

## 2016-10-13 DIAGNOSIS — K219 Gastro-esophageal reflux disease without esophagitis: Secondary | ICD-10-CM | POA: Diagnosis not present

## 2016-10-13 DIAGNOSIS — E669 Obesity, unspecified: Secondary | ICD-10-CM | POA: Insufficient documentation

## 2016-10-13 DIAGNOSIS — E785 Hyperlipidemia, unspecified: Secondary | ICD-10-CM | POA: Insufficient documentation

## 2016-10-13 DIAGNOSIS — Z7982 Long term (current) use of aspirin: Secondary | ICD-10-CM

## 2016-10-13 DIAGNOSIS — Z79899 Other long term (current) drug therapy: Secondary | ICD-10-CM | POA: Diagnosis not present

## 2016-10-13 DIAGNOSIS — Z171 Estrogen receptor negative status [ER-]: Secondary | ICD-10-CM | POA: Insufficient documentation

## 2016-10-13 DIAGNOSIS — C50412 Malignant neoplasm of upper-outer quadrant of left female breast: Secondary | ICD-10-CM | POA: Insufficient documentation

## 2016-10-13 DIAGNOSIS — R739 Hyperglycemia, unspecified: Secondary | ICD-10-CM | POA: Insufficient documentation

## 2016-10-13 DIAGNOSIS — F419 Anxiety disorder, unspecified: Secondary | ICD-10-CM | POA: Insufficient documentation

## 2016-10-13 NOTE — Telephone Encounter (Signed)
Patient has been seen in office by Dr. Hampton Abbot and set-up for a CT Chest, Abdomen, and Pelvis on 10/15/16. As soon as results are received we can go forward with planning port placement.   Call returned to [place] at this time. No answer. Left voicemail for return phone call.

## 2016-10-13 NOTE — Progress Notes (Signed)
  Oncology Nurse Navigator Documentation  Navigator Location: [facility]Med Onc (10/13/16 1500) Referral date to RadOnc/MedOnc: 10/13/16 (10/13/16 1500) )Navigator Encounter Type: Initial MedOnc (10/13/16 1500)   Abnormal Finding Date: 09/22/16 (10/13/16 1500)                 Patient Visit Type: MedOnc;Initial (10/13/16 1500) Treatment Phase: Pre-Tx/Tx Discussion (10/13/16 1500) Barriers/Navigation Needs: Education;Coordination of Care (10/13/16 1500) Education: Preparing for Upcoming Surgery/ Treatment;Coping with Diagnosis/ Prognosis;Understanding Cancer/ Treatment Options (neoadjuvant chemo) (10/13/16 1500) Interventions: Psycho-social support;Coordination of Care;Education (10/13/16 1500)   Coordination of Care: Appts;Chemo (10/13/16 1500)      Specialty Items/DME: Wigs (10/13/16 1500) Acuity: Level 3 (10/13/16 1500)     Acuity Level 3: Coordination of multimodality treatment;Emotional needs;Absence of support;Ongoing guidance and education provided throughout treatment (10/13/16 1500)   Time Spent with Patient: 90 (10/13/16 1500)  Met patient at initial Med/Onc visit. Plans for neo-adjuvant chemotherapy. Patient needs reinforced teaching., and support.  Has difficulty keeping up with appointments. Appointment schedule given to patient.  [facility] to contact patient for [facility] placement

## 2016-10-13 NOTE — Telephone Encounter (Signed)
Brooke from the cancer center is calling to schedule the patient for a port placement. Patient is needing Chemo ASAP. Please set up the port placement and call Brooke at [phone] to let her know when she can schedule the patients chemo. Her orders are in EPIC.

## 2016-10-13 NOTE — Progress Notes (Signed)
New evaluation for breast cancer. Offers no complaints.

## 2016-10-14 NOTE — Telephone Encounter (Addendum)
Spoke with Jerene Pitch in the [employer] and she informed me that patient's CT scan has been cancelled and patient is now scheduled to have a PET Scan on 10/20/16. Patient is to start chemo on 10/27/16 per Dr. Gary Fleet plan. We will obtain PET results and then schedule Port Placement.

## 2016-10-14 NOTE — Patient Instructions (Signed)
Trastuzumab injection for infusion What is this medicine? TRASTUZUMAB (tras TOO zoo mab) is a monoclonal antibody. It is used to treat breast cancer and stomach cancer. This medicine may be used for other purposes; ask your health care provider or pharmacist if you have questions. COMMON BRAND NAME(S): Herceptin What should I tell my health care provider before I take this medicine? They need to know if you have any of these conditions: -heart disease -heart failure -lung or breathing disease, like asthma -an unusual or allergic reaction to trastuzumab, benzyl alcohol, or other medications, foods, dyes, or preservatives -pregnant or trying to get pregnant -breast-feeding How should I use this medicine? This drug is given as an infusion into a vein. It is administered in a hospital or clinic by a specially trained health care professional. Talk to your pediatrician regarding the use of this medicine in children. This medicine is not approved for use in children. Overdosage: If you think you have taken too much of this medicine contact a poison control center or emergency room at once. NOTE: This medicine is only for you. Do not share this medicine with others. What if I miss a dose? It is important not to miss a dose. Call your doctor or health care professional if you are unable to keep an appointment. What may interact with this medicine? This medicine may interact with the following medications: -certain types of chemotherapy, such as daunorubicin, doxorubicin, epirubicin, and idarubicin This list may not describe all possible interactions. Give your health care provider a list of all the medicines, herbs, non-prescription drugs, or dietary supplements you use. Also tell them if you smoke, drink alcohol, or use illegal drugs. Some items may interact with your medicine. What should I watch for while using this medicine? Visit your doctor for checks on your progress. Report any side effects.  Continue your course of treatment even though you feel ill unless your doctor tells you to stop. Call your doctor or health care professional for advice if you get a fever, chills or sore throat, or other symptoms of a cold or flu. Do not treat yourself. Try to avoid being around people who are sick. You may experience fever, chills and shaking during your first infusion. These effects are usually mild and can be treated with other medicines. Report any side effects during the infusion to your health care professional. Fever and chills usually do not happen with later infusions. Do not become pregnant while taking this medicine or for 7 months after stopping it. Women should inform their doctor if they wish to become pregnant or think they might be pregnant. Women of child-bearing potential will need to have a negative pregnancy test before starting this medicine. There is a potential for serious side effects to an unborn child. Talk to your health care professional or pharmacist for more information. Do not breast-feed an infant while taking this medicine or for 7 months after stopping it. Women must use effective birth control with this medicine. What side effects may I notice from receiving this medicine? Side effects that you should report to your doctor or health care professional as soon as possible: -allergic reactions like skin rash, itching or hives, swelling of the face, lips, or tongue -chest pain or palpitations -cough -dizziness -feeling faint or lightheaded, falls -fever -general ill feeling or flu-like symptoms -signs of worsening heart failure like breathing problems; swelling in your legs and feet -unusually weak or tired Side effects that usually do not require medical   attention (report to your doctor or health care professional if they continue or are bothersome): -bone pain -changes in taste -diarrhea -joint pain -nausea/vomiting -weight loss This list may not describe all  possible side effects. Call your doctor for medical advice about side effects. You may report side effects to FDA at 1-800-FDA-1088. Where should I keep my medicine? This drug is given in a hospital or clinic and will not be stored at home. NOTE: This sheet is a summary. It may not cover all possible information. If you have questions about this medicine, talk to your doctor, pharmacist, or health care provider.  2018 Elsevier/Gold Standard (2016-06-30 14:37:52) Pertuzumab injection What is this medicine? PERTUZUMAB (per TOOZ ue mab) is a monoclonal antibody. It is used to treat breast cancer. This medicine may be used for other purposes; ask your health care provider or pharmacist if you have questions. COMMON BRAND NAME(S): PERJETA What should I tell my health care provider before I take this medicine? They need to know if you have any of these conditions: -heart disease -heart failure -high blood pressure -history of irregular heart beat -recent or ongoing radiation therapy -an unusual or allergic reaction to pertuzumab, other medicines, foods, dyes, or preservatives -pregnant or trying to get pregnant -breast-feeding How should I use this medicine? This medicine is for infusion into a vein. It is given by a health care professional in a hospital or clinic setting. Talk to your pediatrician regarding the use of this medicine in children. Special care may be needed. Overdosage: If you think you have taken too much of this medicine contact a poison control center or emergency room at once. NOTE: This medicine is only for you. Do not share this medicine with others. What if I miss a dose? It is important not to miss your dose. Call your doctor or health care professional if you are unable to keep an appointment. What may interact with this medicine? Interactions are not expected. Give your health care provider a list of all the medicines, herbs, non-prescription drugs, or dietary  supplements you use. Also tell them if you smoke, drink alcohol, or use illegal drugs. Some items may interact with your medicine. This list may not describe all possible interactions. Give your health care provider a list of all the medicines, herbs, non-prescription drugs, or dietary supplements you use. Also tell them if you smoke, drink alcohol, or use illegal drugs. Some items may interact with your medicine. What should I watch for while using this medicine? Your condition will be monitored carefully while you are receiving this medicine. Report any side effects. Continue your course of treatment even though you feel ill unless your doctor tells you to stop. Do not become pregnant while taking this medicine or for 7 months after stopping it. Women should inform their doctor if they wish to become pregnant or think they might be pregnant. Women of child-bearing potential will need to have a negative pregnancy test before starting this medicine. There is a potential for serious side effects to an unborn child. Talk to your health care professional or pharmacist for more information. Do not breast-feed an infant while taking this medicine or for 7 months after stopping it. Women must use effective birth control with this medicine. Call your doctor or health care professional for advice if you get a fever, chills or sore throat, or other symptoms of a cold or flu. Do not treat yourself. Try to avoid being around people who are sick. You   may experience fever, chills, and headache during the infusion. Report any side effects during the infusion to your health care professional. What side effects may I notice from receiving this medicine? Side effects that you should report to your doctor or health care professional as soon as possible: -breathing problems -chest pain or palpitations -dizziness -feeling faint or lightheaded -fever or chills -skin rash, itching or hives -sore throat -swelling of the  face, lips, or tongue -swelling of the legs or ankles -unusually weak or tired Side effects that usually do not require medical attention (report to your doctor or health care professional if they continue or are bothersome): -diarrhea -hair loss -nausea, vomiting -tiredness This list may not describe all possible side effects. Call your doctor for medical advice about side effects. You may report side effects to FDA at 1-800-FDA-1088. Where should I keep my medicine? This drug is given in a hospital or clinic and will not be stored at home. NOTE: This sheet is a summary. It may not cover all possible information. If you have questions about this medicine, talk to your doctor, pharmacist, or health care provider.  2018 Elsevier/Gold Standard (2015-08-08 12:08:50) Docetaxel injection What is this medicine? DOCETAXEL (doe se TAX el) is a chemotherapy drug. It targets fast dividing cells, like cancer cells, and causes these cells to die. This medicine is used to treat many types of cancers like breast cancer, certain stomach cancers, head and neck cancer, lung cancer, and prostate cancer. This medicine may be used for other purposes; ask your health care provider or pharmacist if you have questions. COMMON BRAND NAME(S): Docefrez, Taxotere What should I tell my health care provider before I take this medicine? They need to know if you have any of these conditions: -infection (especially a virus infection such as chickenpox, cold sores, or herpes) -liver disease -low blood counts, like low white cell, platelet, or red cell counts -an unusual or allergic reaction to docetaxel, polysorbate 80, other chemotherapy agents, other medicines, foods, dyes, or preservatives -pregnant or trying to get pregnant -breast-feeding How should I use this medicine? This drug is given as an infusion into a vein. It is administered in a hospital or clinic by a specially trained health care professional. Talk to  your pediatrician regarding the use of this medicine in children. Special care may be needed. Overdosage: If you think you have taken too much of this medicine contact a poison control center or emergency room at once. NOTE: This medicine is only for you. Do not share this medicine with others. What if I miss a dose? It is important not to miss your dose. Call your doctor or health care professional if you are unable to keep an appointment. What may interact with this medicine? -cyclosporine -erythromycin -ketoconazole -medicines to increase blood counts like filgrastim, pegfilgrastim, sargramostim -vaccines Talk to your doctor or health care professional before taking any of these medicines: -acetaminophen -aspirin -ibuprofen -ketoprofen -naproxen This list may not describe all possible interactions. Give your health care provider a list of all the medicines, herbs, non-prescription drugs, or dietary supplements you use. Also tell them if you smoke, drink alcohol, or use illegal drugs. Some items may interact with your medicine. What should I watch for while using this medicine? Your condition will be monitored carefully while you are receiving this medicine. You will need important blood work done while you are taking this medicine. This drug may make you feel generally unwell. This is not uncommon, as chemotherapy can   affect healthy cells as well as cancer cells. Report any side effects. Continue your course of treatment even though you feel ill unless your doctor tells you to stop. In some cases, you may be given additional medicines to help with side effects. Follow all directions for their use. Call your doctor or health care professional for advice if you get a fever, chills or sore throat, or other symptoms of a cold or flu. Do not treat yourself. This drug decreases your body's ability to fight infections. Try to avoid being around people who are sick. This medicine may increase your  risk to bruise or bleed. Call your doctor or health care professional if you notice any unusual bleeding. This medicine may contain alcohol in the product. You may get drowsy or dizzy. Do not drive, use machinery, or do anything that needs mental alertness until you know how this medicine affects you. Do not stand or sit up quickly, especially if you are an older patient. This reduces the risk of dizzy or fainting spells. Avoid alcoholic drinks. Do not become pregnant while taking this medicine. Women should inform their doctor if they wish to become pregnant or think they might be pregnant. There is a potential for serious side effects to an unborn child. Talk to your health care professional or pharmacist for more information. Do not breast-feed an infant while taking this medicine. What side effects may I notice from receiving this medicine? Side effects that you should report to your doctor or health care professional as soon as possible: -allergic reactions like skin rash, itching or hives, swelling of the face, lips, or tongue -low blood counts - This drug may decrease the number of white blood cells, red blood cells and platelets. You may be at increased risk for infections and bleeding. -signs of infection - fever or chills, cough, sore throat, pain or difficulty passing urine -signs of decreased platelets or bleeding - bruising, pinpoint red spots on the skin, black, tarry stools, nosebleeds -signs of decreased red blood cells - unusually weak or tired, fainting spells, lightheadedness -breathing problems -fast or irregular heartbeat -low blood pressure -mouth sores -nausea and vomiting -pain, swelling, redness or irritation at the injection site -pain, tingling, numbness in the hands or feet -swelling of the ankle, feet, hands -weight gain Side effects that usually do not require medical attention (report to your doctor or health care professional if they continue or are  bothersome): -bone pain -complete hair loss including hair on your head, underarms, pubic hair, eyebrows, and eyelashes -diarrhea -excessive tearing -changes in the color of fingernails -loosening of the fingernails -nausea -muscle pain -red flush to skin -sweating -weak or tired This list may not describe all possible side effects. Call your doctor for medical advice about side effects. You may report side effects to FDA at 1-800-FDA-1088. Where should I keep my medicine? This drug is given in a hospital or clinic and will not be stored at home. NOTE: This sheet is a summary. It may not cover all possible information. If you have questions about this medicine, talk to your doctor, pharmacist, or health care provider.  2018 Elsevier/Gold Standard (2015-08-08 12:32:56) Carboplatin injection What is this medicine? CARBOPLATIN (KAR boe pla tin) is a chemotherapy drug. It targets fast dividing cells, like cancer cells, and causes these cells to die. This medicine is used to treat ovarian cancer and many other cancers. This medicine may be used for other purposes; ask your health care provider or pharmacist if   you have questions. COMMON BRAND NAME(S): Paraplatin What should I tell my health care provider before I take this medicine? They need to know if you have any of these conditions: -blood disorders -hearing problems -kidney disease -recent or ongoing radiation therapy -an unusual or allergic reaction to carboplatin, cisplatin, other chemotherapy, other medicines, foods, dyes, or preservatives -pregnant or trying to get pregnant -breast-feeding How should I use this medicine? This drug is usually given as an infusion into a vein. It is administered in a hospital or clinic by a specially trained health care professional. Talk to your pediatrician regarding the use of this medicine in children. Special care may be needed. Overdosage: If you think you have taken too much of this  medicine contact a poison control center or emergency room at once. NOTE: This medicine is only for you. Do not share this medicine with others. What if I miss a dose? It is important not to miss a dose. Call your doctor or health care professional if you are unable to keep an appointment. What may interact with this medicine? -medicines for seizures -medicines to increase blood counts like filgrastim, pegfilgrastim, sargramostim -some antibiotics like amikacin, gentamicin, neomycin, streptomycin, tobramycin -vaccines Talk to your doctor or health care professional before taking any of these medicines: -acetaminophen -aspirin -ibuprofen -ketoprofen -naproxen This list may not describe all possible interactions. Give your health care provider a list of all the medicines, herbs, non-prescription drugs, or dietary supplements you use. Also tell them if you smoke, drink alcohol, or use illegal drugs. Some items may interact with your medicine. What should I watch for while using this medicine? Your condition will be monitored carefully while you are receiving this medicine. You will need important blood work done while you are taking this medicine. This drug may make you feel generally unwell. This is not uncommon, as chemotherapy can affect healthy cells as well as cancer cells. Report any side effects. Continue your course of treatment even though you feel ill unless your doctor tells you to stop. In some cases, you may be given additional medicines to help with side effects. Follow all directions for their use. Call your doctor or health care professional for advice if you get a fever, chills or sore throat, or other symptoms of a cold or flu. Do not treat yourself. This drug decreases your body's ability to fight infections. Try to avoid being around people who are sick. This medicine may increase your risk to bruise or bleed. Call your doctor or health care professional if you notice any  unusual bleeding. Be careful brushing and flossing your teeth or using a toothpick because you may get an infection or bleed more easily. If you have any dental work done, tell your dentist you are receiving this medicine. Avoid taking products that contain aspirin, acetaminophen, ibuprofen, naproxen, or ketoprofen unless instructed by your doctor. These medicines may hide a fever. Do not become pregnant while taking this medicine. Women should inform their doctor if they wish to become pregnant or think they might be pregnant. There is a potential for serious side effects to an unborn child. Talk to your health care professional or pharmacist for more information. Do not breast-feed an infant while taking this medicine. What side effects may I notice from receiving this medicine? Side effects that you should report to your doctor or health care professional as soon as possible: -allergic reactions like skin rash, itching or hives, swelling of the face, lips, or tongue -signs   of infection - fever or chills, cough, sore throat, pain or difficulty passing urine -signs of decreased platelets or bleeding - bruising, pinpoint red spots on the skin, black, tarry stools, nosebleeds -signs of decreased red blood cells - unusually weak or tired, fainting spells, lightheadedness -breathing problems -changes in hearing -changes in vision -chest pain -high blood pressure -low blood counts - This drug may decrease the number of white blood cells, red blood cells and platelets. You may be at increased risk for infections and bleeding. -nausea and vomiting -pain, swelling, redness or irritation at the injection site -pain, tingling, numbness in the hands or feet -problems with balance, talking, walking -trouble passing urine or change in the amount of urine Side effects that usually do not require medical attention (report to your doctor or health care professional if they continue or are bothersome): -hair  loss -loss of appetite -metallic taste in the mouth or changes in taste This list may not describe all possible side effects. Call your doctor for medical advice about side effects. You may report side effects to FDA at 1-800-FDA-1088. Where should I keep my medicine? This drug is given in a hospital or clinic and will not be stored at home. NOTE: This sheet is a summary. It may not cover all possible information. If you have questions about this medicine, talk to your doctor, pharmacist, or health care provider.  2018 Elsevier/Gold Standard (2007-10-11 14:38:05)  

## 2016-10-15 ENCOUNTER — Ambulatory Visit: Payer: Medicare Other

## 2016-10-15 ENCOUNTER — Inpatient Hospital Stay: Payer: Medicare Other

## 2016-10-16 ENCOUNTER — Ambulatory Visit
Admission: RE | Admit: 2016-10-16 | Discharge: 2016-10-16 | Disposition: A | Discharge status: Home / Self Care | Payer: Medicare Other | Source: Ambulatory Visit | Attending: Oncology | Admitting: Oncology

## 2016-10-16 DIAGNOSIS — C50412 Malignant neoplasm of upper-outer quadrant of left female breast: Secondary | ICD-10-CM | POA: Diagnosis not present

## 2016-10-16 DIAGNOSIS — C50912 Malignant neoplasm of unspecified site of left female breast: Secondary | ICD-10-CM | POA: Diagnosis not present

## 2016-10-16 DIAGNOSIS — Z0189 Encounter for other specified special examinations: Secondary | ICD-10-CM | POA: Diagnosis not present

## 2016-10-16 MED ORDER — TECHNETIUM TC 99M-LABELED RED BLOOD CELLS IV KIT
20.0000 | PACK | Freq: Once | INTRAVENOUS | Status: AC | PRN
  Administered 2016-10-16: 20.72 via INTRAVENOUS

## 2016-10-20 ENCOUNTER — Other Ambulatory Visit: Payer: Medicare Other

## 2016-10-20 ENCOUNTER — Other Ambulatory Visit: Payer: Self-pay | Admitting: Cardiothoracic Surgery

## 2016-10-20 ENCOUNTER — Ambulatory Visit
Admission: RE | Admit: 2016-10-20 | Discharge: 2016-10-20 | Disposition: A | Discharge status: Home / Self Care | Payer: Medicare Other | Source: Ambulatory Visit | Attending: Oncology | Admitting: Oncology

## 2016-10-20 ENCOUNTER — Telehealth: Payer: Self-pay

## 2016-10-20 DIAGNOSIS — C50412 Malignant neoplasm of upper-outer quadrant of left female breast: Secondary | ICD-10-CM | POA: Diagnosis not present

## 2016-10-20 DIAGNOSIS — I700 Atherosclerosis of aorta: Secondary | ICD-10-CM | POA: Insufficient documentation

## 2016-10-20 DIAGNOSIS — C773 Secondary and unspecified malignant neoplasm of axilla and upper limb lymph nodes: Secondary | ICD-10-CM | POA: Diagnosis not present

## 2016-10-20 DIAGNOSIS — Z853 Personal history of malignant neoplasm of breast: Secondary | ICD-10-CM

## 2016-10-20 LAB — GLUCOSE, CAPILLARY: Glucose-Capillary: 83 mg/dL (ref 65–99)

## 2016-10-20 MED ORDER — LIDOCAINE-PRILOCAINE 2.5-2.5 % EX CREA: TOPICAL_CREAM | CUTANEOUS | 3 refills | Status: DC

## 2016-10-20 MED ORDER — PROCHLORPERAZINE MALEATE 10 MG PO TABS: 10.0000 mg | ORAL_TABLET | Freq: Four times a day (QID) | ORAL | 2 refills | Status: DC | PRN

## 2016-10-20 MED ORDER — ONDANSETRON HCL 8 MG PO TABS: 8.0000 mg | ORAL_TABLET | Freq: Two times a day (BID) | ORAL | 2 refills | Status: DC | PRN

## 2016-10-20 MED ORDER — FLUDEOXYGLUCOSE F - 18 (FDG) INJECTION
12.2000 | Freq: Once | INTRAVENOUS | Status: AC | PRN
  Administered 2016-10-20: 12.2 via INTRAVENOUS

## 2016-10-20 NOTE — Telephone Encounter (Signed)
Spoke with Dr. Hampton Abbot and Dr. Genevive Bi regarding this patient. Dr. Genevive Bi will be placing port next week per Dr. Hampton Abbot but would like to see patient in clinic on 10/23/16. Placed on schedule per Dr. Genevive Bi.

## 2016-10-20 NOTE — Progress Notes (Signed)
  Oncology Nurse Navigator Documentation  Navigator Location: [facility] (10/20/16 1000)   )Navigator Encounter Type: Telephone;Letter/Fax/Email (10/20/16 1000)                     Patient Visit Type: Follow-up (10/20/16 1000) Treatment Phase: Pre-Tx/Tx Discussion (10/20/16 1000) Barriers/Navigation Needs: Coordination of Care (10/20/16 1000) Education: Preparing for Upcoming Surgery/ Treatment (10/20/16 1000) Interventions: Coordination of Care (10/20/16 1000)   Coordination of Care: Appts (10/20/16 1000) Education Method: Written;Verbal (10/20/16 1000)      Acuity: Level 3 (10/20/16 1000)     Acuity Level 3: Coordination of multimodality treatment;Ongoing guidance and education provided throughout treatment (10/20/16 1000)   Time Spent with Patient: 30 (10/20/16 1000)   Patient's appointment for labs and to see MD prior to first chemotherapy need to be changed.  Mailed new appointment reminder, and phoned patient with new appointment for 10/26/16 at 10:45 labs and 11:00 see Dr. Grayland Ormond.  Requested patient call back to confirm she is aware of change.

## 2016-10-20 NOTE — Progress Notes (Signed)
START ON PATHWAY REGIMEN - Breast   Docetaxel  + Carboplatin + Trastuzumab + Pertuzumab (TCHP) q21 Days:   A cycle is every 21 days:     Pertuzumab      Pertuzumab      Trastuzumab      Trastuzumab      Carboplatin      Docetaxel   **Always confirm dose/schedule in your pharmacy ordering system**    Trastuzumab (Maintenance - NO Loading Dose):   A cycle is every 21 days:     Trastuzumab   **Always confirm dose/schedule in your pharmacy ordering system**    Patient Characteristics: Preoperative or Nonsurgical Candidate (Clinical Staging), Neoadjuvant Therapy followed by Surgery, Invasive Disease, Chemotherapy, HER2 Positive, 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

## 2016-10-21 ENCOUNTER — Telehealth: Payer: Self-pay

## 2016-10-21 NOTE — Telephone Encounter (Signed)
No authorization required for CPT code [id number] per [employer]/Med[employer]are.

## 2016-10-21 NOTE — Telephone Encounter (Signed)
Patient placed on schedule for Port Placement by Dr. Genevive Bi on 10/27/16. We will send patient to Pre-admission testing right after appointment with Dr. Genevive Bi in office. Office appointment is scheduled for 10/23/16. All information will be reviewed with patient at her appointment.  She has been told to stop taking her Aspirin as of 10/22/16 and we will resume taking this after surgery.

## 2016-10-22 ENCOUNTER — Telehealth: Payer: Self-pay

## 2016-10-22 NOTE — Progress Notes (Signed)
  Oncology Nurse Navigator Documentation  Navigator Location: [facility] (10/22/16 1100)   )Navigator Encounter Type: Telephone (10/22/16 1100)                     Patient Visit Type: Follow-up (10/22/16 1100)   Barriers/Navigation Needs: Coordination of Care (10/22/16 1100)   Interventions: Coordination of Care (10/22/16 1100)   Coordination of Care: Appts (10/22/16 1100) Education Method: Verbal (10/22/16 1100)      Acuity: Level 3 (10/22/16 1100)     Acuity Level 3: Coordination of multimodality treatment (10/22/16 1100)   Time Spent with Patient: 30 (10/22/16 1100)  Phoned patient to confirm she is aware of appointment change, and that she is getting lab work,and seeing Dr. Grayland Ormond on 10/26/16.  Confirmed other appointment times and locattions with patient also.  She states she may cancel appointment with Dr.Dew on 10/27/16 due to her schedule.

## 2016-10-22 NOTE — Telephone Encounter (Signed)
This has been faxed over to [place] per Webb Silversmith

## 2016-10-22 NOTE — Telephone Encounter (Signed)
-----   Message from Theodore Demark, RN sent at 10/22/2016 11:28 AM EDT ----- Regarding: Order for port placement Reece Packer, [place], RN  I received this message from [place] at [facility]:  I spoke with Dr. Hampton Abbot and we reviewed the PET Scan. He is going to talk to Dr. Grayland Ormond in regards to possible future Breast Surgery. However, We will schedule her port placement for next week. Dr. Genevive Bi will most likely be the one placing the port for Dr. Hampton Abbot as he does not have any OR Time available. I will let you know the definites of that as soon as I know.   Can you please fax me a Port Insertion Order?

## 2016-10-23 ENCOUNTER — Ambulatory Visit (INDEPENDENT_AMBULATORY_CARE_PROVIDER_SITE_OTHER): Payer: Medicare Other | Admitting: Cardiothoracic Surgery

## 2016-10-23 ENCOUNTER — Ambulatory Visit
Admission: RE | Admit: 2016-10-23 | Discharge: 2016-10-23 | Disposition: A | Discharge status: Home / Self Care | Payer: Medicare Other | Source: Ambulatory Visit | Attending: Cardiothoracic Surgery | Admitting: Cardiothoracic Surgery

## 2016-10-23 ENCOUNTER — Encounter: Payer: Self-pay | Admitting: Cardiothoracic Surgery

## 2016-10-23 ENCOUNTER — Encounter
Admission: RE | Admit: 2016-10-23 | Discharge: 2016-10-23 | Disposition: A | Discharge status: Home / Self Care | Payer: Medicare Other | Source: Ambulatory Visit | Attending: Cardiothoracic Surgery | Admitting: Cardiothoracic Surgery

## 2016-10-23 VITALS — BP 129/81 | HR 74 | Temp 98.2°F | Resp 14 | Ht 62.0 in | Wt 170.0 lb

## 2016-10-23 DIAGNOSIS — Z01812 Encounter for preprocedural laboratory examination: Secondary | ICD-10-CM | POA: Diagnosis not present

## 2016-10-23 DIAGNOSIS — Z09 Encounter for follow-up examination after completed treatment for conditions other than malignant neoplasm: Secondary | ICD-10-CM

## 2016-10-23 DIAGNOSIS — Z01810 Encounter for preprocedural cardiovascular examination: Secondary | ICD-10-CM | POA: Diagnosis not present

## 2016-10-23 DIAGNOSIS — C50412 Malignant neoplasm of upper-outer quadrant of left female breast: Secondary | ICD-10-CM

## 2016-10-23 DIAGNOSIS — R918 Other nonspecific abnormal finding of lung field: Secondary | ICD-10-CM | POA: Diagnosis not present

## 2016-10-23 DIAGNOSIS — Z01818 Encounter for other preprocedural examination: Secondary | ICD-10-CM | POA: Diagnosis not present

## 2016-10-23 DIAGNOSIS — C50912 Malignant neoplasm of unspecified site of left female breast: Secondary | ICD-10-CM | POA: Insufficient documentation

## 2016-10-23 LAB — COMPREHENSIVE METABOLIC PANEL
ALT: 18 U/L (ref 14–54)
AST: 21 U/L (ref 15–41)
Albumin: 3.9 g/dL (ref 3.5–5.0)
Alkaline Phosphatase: 91 U/L (ref 38–126)
Anion gap: 5 (ref 5–15)
BILIRUBIN TOTAL: 0.5 mg/dL (ref 0.3–1.2)
BUN: 16 mg/dL (ref 6–20)
CHLORIDE: 113 mmol/L — AB (ref 101–111)
CO2: 25 mmol/L (ref 22–32)
CREATININE: 1.02 mg/dL — AB (ref 0.44–1.00)
Calcium: 9.6 mg/dL (ref 8.9–10.3)
GFR, EST AFRICAN AMERICAN: 60 mL/min — AB (ref >60)
GFR, EST NON AFRICAN AMERICAN: 52 mL/min — AB (ref >60)
Glucose, Bld: 83 mg/dL (ref 65–99)
Potassium: 4.5 mmol/L (ref 3.5–5.1)
Sodium: 143 mmol/L (ref 135–145)
Total Protein: 7.3 g/dL (ref 6.5–8.1)

## 2016-10-23 LAB — CBC
HCT: 37.8 % (ref 35.0–47.0)
HEMOGLOBIN: 12.5 g/dL (ref 12.0–16.0)
MCH: 29.4 pg (ref 26.0–34.0)
MCHC: 33 g/dL (ref 32.0–36.0)
MCV: 89.2 fL (ref 80.0–100.0)
PLATELETS: 245 10*3/uL (ref 150–440)
RBC: 4.24 MIL/uL (ref 3.80–5.20)
RDW: 14.8 % — ABNORMAL HIGH (ref 11.5–14.5)
WBC: 5.5 10*3/uL (ref 3.6–11.0)

## 2016-10-23 LAB — PROTIME-INR
INR: 0.97
Prothrombin Time: 12.9 seconds (ref 11.4–15.2)

## 2016-10-23 LAB — APTT: aPTT: 31 seconds (ref 24–36)

## 2016-10-23 NOTE — Progress Notes (Signed)
  Patient ID: Maria Patterson, female   DOB: [birth year], 76 y.o.   MRN: [id number]  HISTORY: This patient is a 76 year old African-American female who discovered a left breast mass on self-exam. She's been extensively evaluated by oncology and surgery and she requires a Port-A-Cath for administration chemotherapy. She saw Dr. per square earlier this week who unfortunately could not place her Port-A-Cath until after her scheduled chemotherapy start date. Therefore she comes in today to meet with me and to discuss the indications for Port-A-Cath placement. I reviewed with her the indications and risks. Risks of bleeding, infection, pneumothorax were all discussed. I explained to her that the catheters usually placed in the internal jugular position. However this be dependent upon an ultrasound performed at the time of her procedure.   Vitals:   10/23/16 1016  BP: 129/81  Pulse: 74  Resp: 14  Temp: 98.2 F (36.8 C)     EXAM:    Resp: Lungs are clear bilaterally.  No respiratory distress, normal effort. Heart:  Regular without murmurs Abd:  Abdomen is soft, non distended and non tender. No masses are palpable.  There is no rebound and no guarding.  Neurological: Alert and oriented to person, place, and time. Coordination normal.  Skin: Skin is warm and dry. No rash noted. No diaphoretic. No erythema. No pallor.  Psychiatric: Normal mood and affect. Normal behavior. Judgment and thought content normal.    ASSESSMENT: Left-sided breast cancer with the need for intravenous access for chemotherapy   PLAN:   Insertion of Port-A-Cath. She will see our preoperative clinic today and will have a port placed next week. All questions were answered.    Nestor Lewandowsky, MD

## 2016-10-23 NOTE — Addendum Note (Signed)
Addended by: Nestor Lewandowsky E on: 10/23/2016 11:46 AM   Modules accepted: Orders, SmartSet

## 2016-10-23 NOTE — Care Management (Signed)
EKG reviewed, and ok.No change from the one done in 2009. There were T wave inversions in I and AVL.

## 2016-10-23 NOTE — Patient Instructions (Addendum)
Please go to Pre-Admit today. They will draw blood, have a Chest X-Ray, and go over your medical history and medications.  Dr. Genevive Bi will place your Port-A-Cath on Tuesday 10/28/2016. Please look at your blue sheet in case you have any questions.  Please stop your Aspirin as of today until we tell you when to restart it.

## 2016-10-23 NOTE — Patient Instructions (Signed)
Your procedure is scheduled on: Tuesday 10/27/16 Report to [facility]. 2ND FLOOR MEDICAL MALL ENTRANCE. To find out your arrival time please call [phone] between 1PM - 3PM on Monday 10/26/16.  Remember: Instructions that are not followed completely may result in serious medical risk, up to and including death, or upon the discretion of your surgeon and anesthesiologist your surgery may need to be rescheduled.    __X__ 1. Do not eat food or drink liquids after midnight. No gum chewing or hard candies.     __X__ 2. No Alcohol for 24 hours before or after surgery.   ____ 3. Bring all medications with you on the day of surgery if instructed.    __X__ 4. Notify your doctor if there is any change in your medical condition     (cold, fever, infections).             ___X__5. No smoking within 24 hours of your surgery.     Do not wear jewelry, make-up, hairpins, clips or nail polish.  Do not wear lotions, powders, or perfumes.   Do not shave 48 hours prior to surgery. Men may shave face and neck.  Do not bring valuables to the hospital.    [facility] is not responsible for any belongings or valuables.               Contacts, dentures or bridgework may not be worn into surgery.  Leave your suitcase in the car. After surgery it may be brought to your room.  For patients admitted to the hospital, discharge time is determined by your                treatment team.   Patients discharged the day of surgery will not be allowed to drive home.   Please read over the following fact sheets that you were given:   Pain Booklet and MRSA Information   __X__ Take these medicines the morning of surgery with A SIP OF WATER:    1. ATORVASTATIN  2. OMEPRAZOLE  3.   4.  5.  6.  ____ Fleet Enema (as directed)   __X__ Use CHG Soap as directed  ____ Use inhalers on the day of surgery  ____ Stop metformin 2 days prior to surgery    ____ Take 1/2 of usual insulin dose the night before surgery and  none on the morning of surgery.   __X__ Stop Coumadin/Plavix/aspirin on 10/22/16  __X__ Stop Anti-inflammatories such as Advil, Aleve, Ibuprofen, Motrin, Naproxen, Naprosyn, Goodies,powder, or aspirin products.  OK to take Tylenol.   ____ Stop supplements until after surgery.    ____ Bring C-Pap to the hospital.

## 2016-10-25 NOTE — Progress Notes (Signed)
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  INTERVAL HISTORY: Patient returns to clinic today for further evaluation prior to cycle 1 of neoadjuvant Taxotere, carboplatin, Herceptin, and Perjeta. She continues to be anxious, but otherwise feels well. She has no neurologic complaints. She denies any recent fevers or illnesses. She has a good appetite and denies weight loss. She has no chest pain or shortness of breath. She denies any nausea, vomiting, constipation, or diarrhea. She has no urinary complaints. Patient offers no further specific complaints today.  REVIEW OF SYSTEMS:   Review of Systems  Constitutional: Negative.  Negative for fever, malaise/fatigue and weight loss.  Respiratory: Negative.  Negative for cough.   Cardiovascular: Negative.  Negative for chest pain and leg swelling.  Gastrointestinal: Negative.  Negative for abdominal pain.  Genitourinary: Negative.   Musculoskeletal: Negative.   Neurological: Negative.  Negative for weakness.  Psychiatric/Behavioral: The patient is nervous/anxious.     As per HPI. Otherwise, a complete review of systems is negative.  PAST MEDICAL HISTORY: Past Medical History:  Diagnosis Date  . Breast mass, left 09/22/2016   RECOMMENDATION: Ultrasound-guided core biopsies of masses at left breast 2 o'clock 2 cm from nipple, left breast 2 o'clock 5 cm from nipple, abnormal left axillary lymph nodes.  Marland Kitchen GERD (gastroesophageal reflux disease)   . Hyperglycemia   . Hyperlipidemia   . Obesity   . Postmenopausal     PAST SURGICAL HISTORY: Past Surgical History:  Procedure Laterality Date  . ABDOMINAL HYSTERECTOMY    . BREAST BIOPSY  Left 09/30/2016   US biopsy of 3 areas, awaiting pathology  . ESOPHAGOGASTRODUODENOSCOPY (EGD) WITH PROPOFOL N/A 12/24/2014   Procedure: ESOPHAGOGASTRODUODENOSCOPY (EGD) WITH PROPOFOL;  Surgeon: Manya Silvas, MD;  Location: [facility];  Service: Endoscopy;  Laterality: N/A;  . PORTACATH PLACEMENT Right 10/27/2016   Procedure: INSERTION PORT-A-CATH;  Surgeon: Nestor Lewandowsky, MD;  Location: [facility];  Service: General;  Laterality: Right;  . TONSILECTOMY, ADENOIDECTOMY, BILATERAL MYRINGOTOMY AND TUBES      FAMILY HISTORY: Family History  Problem Relation Age of Onset  . Stroke Mother   . Stroke Father   . Cancer Neg Hx   . Depression Neg Hx     ADVANCED DIRECTIVES (Y/N):  N  HEALTH MAINTENANCE: Social History  Substance Use Topics  . Smoking status: Never Smoker  . Smokeless tobacco: Never Used  . Alcohol use No     Colonoscopy:  PAP:  Bone density:  Lipid panel:  Allergies  Allergen Reactions  . Latex Itching  . Penicillins Itching and Rash    Has patient had a PCN reaction causing immediate rash, facial/tongue/throat swelling, SOB or lightheadedness with hypotension: Yes Has patient had a PCN reaction causing severe rash involving mucus membranes or skin necrosis: No Has patient had a PCN reaction that required hospitalization No Has patient had a PCN reaction occurring within the last 10 years: No If all of the above answers are "NO", then may proceed with Cephalosporin use.     Current Outpatient Prescriptions  Medication Sig Dispense Refill  . acetaminophen (TYLENOL) 500 MG tablet Take 1,000 mg by mouth every 6 (six) hours as needed for mild pain.    Marland Kitchen aspirin 81 MG  tablet Take 81 mg by mouth daily.    Marland Kitchen atorvastatin (LIPITOR) 40 MG tablet Take 1 tablet (40 mg total) by mouth at bedtime. (Patient taking differently: Take 40 mg by mouth 3 (three) times a week. In the evening  Depends on patient's preference if takes 20-40 mg) 30 tablet 1  . Cholecalciferol  (VITAMIN D) 2000 units tablet Take 2,000 Units by mouth daily. One by mouth twice a week    . ferrous sulfate 325 (65 FE) MG EC tablet Take 325 mg by mouth every other day.     . fluticasone (FLONASE) 50 MCG/ACT nasal spray Place 1 spray into both nostrils daily as needed for allergies or rhinitis.    . Multiple Vitamin (MULTIVITAMIN WITH MINERALS) TABS tablet Take 1 tablet by mouth every other day.     Marland Kitchen omeprazole (PRILOSEC) 20 MG capsule Take 20 mg by mouth daily as needed (heartburn).     Marland Kitchen lidocaine-prilocaine (EMLA) cream Apply to affected area once (Patient not taking: Reported on 10/27/2016) 30 g 3  . ondansetron (ZOFRAN) 8 MG tablet Take 1 tablet (8 mg total) by mouth 2 (two) times daily as needed for refractory nausea / vomiting. 60 tablet 2  . prochlorperazine (COMPAZINE) 10 MG tablet Take 1 tablet (10 mg total) by mouth every 6 (six) hours as needed (Nausea or vomiting). 60 tablet 2   No current facility-administered medications for this visit.     OBJECTIVE: Vitals:   10/26/16 1338  BP: 136/82  Pulse: 73  Resp: 18  Temp: 97.5 F (36.4 C)     Body mass index is 30.95 kg/m.    ECOG FS:0 - Asymptomatic  General: Well-developed, well-nourished, no acute distress. Eyes: Pink conjunctiva, anicteric sclera. Breasts: Easily palpable left breast mass. Exam deferred today. Lungs: Clear to auscultation bilaterally. Heart: Regular rate and rhythm. No rubs, murmurs, or gallops. Abdomen: Soft, nontender, nondistended. No organomegaly noted, normoactive bowel sounds. Musculoskeletal: No edema, cyanosis, or clubbing. Neuro: Alert, answering all questions appropriately. Cranial nerves grossly intact. Skin: No rashes or petechiae noted. Psych: Normal affect.  LAB RESULTS:  Lab Results  Component Value Date   NA 140 11/01/2016   K 4.0 11/01/2016   CL 111 11/01/2016   CO2 22 11/01/2016   GLUCOSE 89 11/01/2016   BUN 16 11/01/2016   CREATININE 0.84 11/01/2016   CALCIUM 9.0  11/01/2016   PROT 6.5 11/01/2016   ALBUMIN 3.3 (L) 11/01/2016   AST 27 11/01/2016   ALT 23 11/01/2016   ALKPHOS 106 11/01/2016   BILITOT 0.8 11/01/2016   GFRNONAA >60 11/01/2016   GFRAA >60 11/01/2016    Lab Results  Component Value Date   WBC 14.8 (H) 11/01/2016   NEUTROABS 12.7 (H) 11/01/2016   HGB 11.2 (L) 11/01/2016   HCT 33.0 (L) 11/01/2016   MCV 88.3 11/01/2016   PLT 146 (L) 11/01/2016     STUDIES: Dg Chest 2 View  Result Date: 10/23/2016 CLINICAL DATA:  Left breast mass EXAM: CHEST  2 VIEW COMPARISON:  10/20/2016 and 12/11/2011 FINDINGS: Cardiomediastinal silhouette is stable. No infiltrate or pleural effusion. No pulmonary edema. Mild elevation of the right hemidiaphragm again noted. Bony thorax is unremarkable. IMPRESSION: No active cardiopulmonary disease. Electronically Signed   By: Lahoma Crocker M.D.   On: 10/23/2016 14:01   Nm Cardiac Muga Rest  Result Date: 10/16/2016 CLINICAL DATA:  LEFT breast cancer, pretreatment assessment of cardiac function EXAM: NUCLEAR MEDICINE CARDIAC BLOOD POOL IMAGING (MUGA) TECHNIQUE: Cardiac multi-gated acquisition was  performed at rest following intravenous injection of Tc-35mlabeled red blood cells. RADIOPHARMACEUTICALS:  20.72 mCi Tc-933mertechnetate in-vitro labeled autologous red blood cells IV COMPARISON:  None FINDINGS: LEFT ventricular ejection fraction is calculated at 65%, within the normal range. Study was obtained at a heart rate of 60 beats per minute. Cine analysis of the gated blood pool in 3 projections demonstrates normal LEFT ventricular wall motion. IMPRESSION: Normal LEFT ventricular ejection fraction 65%. Normal LV wall motion. Electronically Signed   By: MaLavonia Dana.D.   On: 10/16/2016 13:42   Nm Pet Image Initial (pi) Skull Base To Thigh  Result Date: 10/20/2016 CLINICAL DATA:  Initial treatment strategy for left upper outer quadrant breast carcinoma. EXAM: NUCLEAR MEDICINE PET SKULL BASE TO THIGH TECHNIQUE: 12.2 mCi  F-18 FDG was injected intravenously. Full-ring PET imaging was performed from the skull base to thigh after the radiotracer. CT data was obtained and used for attenuation correction and anatomic localization. FASTING BLOOD GLUCOSE:  Value: 83 mg/dl COMPARISON:  AP CT on 03/01/2012 FINDINGS: NECK No hypermetabolic lymph nodes in the neck. CHEST 2.9 cm mass in the upper outer quadrant left breast is hypermetabolic, with SUV max of 22.2. This is consistent with known primary breast carcinoma. Hypermetabolic left axillary and subpectoral lymphadenopathy is seen. These measure less than 1 cm but have SUV max of 16.5, consistent with metastatic lymphadenopathy. No hypermetabolic mediastinal or hilar nodes. No suspicious pulmonary nodules on the CT scan. ABDOMEN/PELVIS No abnormal hypermetabolic activity within the liver, pancreas, adrenal glands, or spleen. Stable small benign right renal cysts. Shotty mesenteric lymph nodes are seen, largest measuring 8 mm on image 153/4 with SUV max of 4.7. No pathologically enlarged lymph nodes identified. Prior hysterectomy noted. Adnexal regions are unremarkable in appearance. Aortic atherosclerosis. SKELETON No focal hypermetabolic activity to suggest skeletal metastasis. IMPRESSION: Hypermetabolic 3 cm mass in upper outer quadrant left breast, consistent with known primary breast carcinoma. Hypermetabolic left axillary and subpectoral lymphadenopathy, consistent with metastatic disease. Shotty less than 1 cm mesenteric lymph nodes with FDG uptake. Differential diagnosis includes inflammatory etiologies and small lymph node metastases. Recommend continued attention on follow-up CT. Aortic atherosclerosis. Electronically Signed   By: JoEarle Gell.D.   On: 10/20/2016 13:23   Dg Chest Port 1 View  Result Date: 10/27/2016 CLINICAL DATA:  Status post Port-A-Cath insertion. EXAM: PORTABLE CHEST 1 VIEW COMPARISON:  PA and lateral chest 10/23/2016. FINDINGS: Right IJ approach  Port-A-Cath is in place with the tip projecting in the mid to lower superior vena cava. No pneumothorax. Lung volumes are low with some basilar atelectasis. No pleural effusion. Heart size is upper normal. Aortic atherosclerosis is noted. IMPRESSION: Port-A-Cath tip projects in the mid to lower superior vena cava. Negative for pneumothorax or other acute abnormality. Electronically Signed   By: ThInge Rise.D.   On: 10/27/2016 14:58   Dg C-arm 1-60 Min-no Report  Result Date: 10/27/2016 Fluoroscopy was utilized by the requesting physician.  No radiographic interpretation.    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  PLAN:    1. 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 Patient will then require surgery followed by adjuvant XRT. Patient will receive Herceptin only every 3 weeks for an entire year after her surgery. An aromatase  inhibitor would not offer benefit given the ER/PR negativity of her disease. Pretreatment MUGA scan is adequate to proceed with an EF of 65%. PET scan results reviewed independently and reported as above with no obvious metastatic disease other than her known disease in breast and axilla. Return to clinic on October 28, 2016 for consideration of cycle 1 of 6 of neoadjuvant treatment. Patient will return in 1 week later to assess her toleration of treatment and then in 3 weeks for consideration of cycle 2.  Approximately 30 minutes was spent in discussion of which greater than 50% was consultation.  Patient expressed understanding and was in agreement with this plan. She also understands that She can call clinic at any time with any questions, concerns, or complaints.   Cancer  Staging Malignant neoplasm of upper-outer quadrant of left female breast [facility]) Staging form: Breast, AJCC 8th Edition - Clinical stage from 10/11/2016: Stage IIB (cT2, cN1, cM0, G3, ER: Negative, PR: Negative, HER2: Positive) - Signed by Lloyd Huger, MD on 10/11/2016   Lloyd Huger, MD   11/01/2016 11:01 AM

## 2016-10-26 ENCOUNTER — Inpatient Hospital Stay (HOSPITAL_BASED_OUTPATIENT_CLINIC_OR_DEPARTMENT_OTHER): Payer: Medicare Other | Admitting: Oncology

## 2016-10-26 ENCOUNTER — Inpatient Hospital Stay: Payer: Medicare Other | Attending: Oncology

## 2016-10-26 VITALS — BP 136/82 | HR 73 | Temp 97.5°F | Resp 18 | Wt 169.2 lb

## 2016-10-26 DIAGNOSIS — E785 Hyperlipidemia, unspecified: Secondary | ICD-10-CM

## 2016-10-26 DIAGNOSIS — N281 Cyst of kidney, acquired: Secondary | ICD-10-CM | POA: Diagnosis not present

## 2016-10-26 DIAGNOSIS — L03818 Cellulitis of other sites: Secondary | ICD-10-CM | POA: Insufficient documentation

## 2016-10-26 DIAGNOSIS — E669 Obesity, unspecified: Secondary | ICD-10-CM | POA: Diagnosis not present

## 2016-10-26 DIAGNOSIS — N2889 Other specified disorders of kidney and ureter: Secondary | ICD-10-CM | POA: Insufficient documentation

## 2016-10-26 DIAGNOSIS — Z7982 Long term (current) use of aspirin: Secondary | ICD-10-CM | POA: Insufficient documentation

## 2016-10-26 DIAGNOSIS — K219 Gastro-esophageal reflux disease without esophagitis: Secondary | ICD-10-CM | POA: Insufficient documentation

## 2016-10-26 DIAGNOSIS — Z79899 Other long term (current) drug therapy: Secondary | ICD-10-CM

## 2016-10-26 DIAGNOSIS — I700 Atherosclerosis of aorta: Secondary | ICD-10-CM | POA: Diagnosis not present

## 2016-10-26 DIAGNOSIS — Z171 Estrogen receptor negative status [ER-]: Secondary | ICD-10-CM | POA: Insufficient documentation

## 2016-10-26 DIAGNOSIS — F419 Anxiety disorder, unspecified: Secondary | ICD-10-CM | POA: Diagnosis not present

## 2016-10-26 DIAGNOSIS — C50412 Malignant neoplasm of upper-outer quadrant of left female breast: Secondary | ICD-10-CM | POA: Insufficient documentation

## 2016-10-26 DIAGNOSIS — R739 Hyperglycemia, unspecified: Secondary | ICD-10-CM | POA: Diagnosis not present

## 2016-10-26 DIAGNOSIS — Z90710 Acquired absence of both cervix and uterus: Secondary | ICD-10-CM

## 2016-10-26 DIAGNOSIS — D72829 Elevated white blood cell count, unspecified: Secondary | ICD-10-CM | POA: Insufficient documentation

## 2016-10-26 DIAGNOSIS — Z5111 Encounter for antineoplastic chemotherapy: Secondary | ICD-10-CM | POA: Diagnosis not present

## 2016-10-26 DIAGNOSIS — Z5112 Encounter for antineoplastic immunotherapy: Secondary | ICD-10-CM | POA: Diagnosis not present

## 2016-10-26 LAB — CBC WITH DIFFERENTIAL/PLATELET
Basophils Absolute: 0.2 10*3/uL — ABNORMAL HIGH (ref 0–0.1)
Basophils Relative: 4 %
EOS PCT: 3 %
Eosinophils Absolute: 0.1 10*3/uL (ref 0–0.7)
HCT: 36.9 % (ref 35.0–47.0)
Hemoglobin: 12.5 g/dL (ref 12.0–16.0)
LYMPHS ABS: 2.2 10*3/uL (ref 1.0–3.6)
Lymphocytes Relative: 39 %
MCH: 29.7 pg (ref 26.0–34.0)
MCHC: 33.8 g/dL (ref 32.0–36.0)
MCV: 87.8 fL (ref 80.0–100.0)
MONO ABS: 0.3 10*3/uL (ref 0.2–0.9)
MONOS PCT: 6 %
Neutro Abs: 2.8 10*3/uL (ref 1.4–6.5)
Neutrophils Relative %: 48 %
PLATELETS: 238 10*3/uL (ref 150–440)
RBC: 4.2 MIL/uL (ref 3.80–5.20)
RDW: 14.4 % (ref 11.5–14.5)
WBC: 5.7 10*3/uL (ref 3.6–11.0)

## 2016-10-26 LAB — COMPREHENSIVE METABOLIC PANEL
ALT: 20 U/L (ref 14–54)
ANION GAP: 8 (ref 5–15)
AST: 21 U/L (ref 15–41)
Albumin: 4 g/dL (ref 3.5–5.0)
Alkaline Phosphatase: 87 U/L (ref 38–126)
BUN: 15 mg/dL (ref 6–20)
CALCIUM: 9.3 mg/dL (ref 8.9–10.3)
CHLORIDE: 112 mmol/L — AB (ref 101–111)
CO2: 21 mmol/L — ABNORMAL LOW (ref 22–32)
Creatinine, Ser: 1.06 mg/dL — ABNORMAL HIGH (ref 0.44–1.00)
GFR calc Af Amer: 57 mL/min — ABNORMAL LOW (ref >60)
GFR, EST NON AFRICAN AMERICAN: 49 mL/min — AB (ref >60)
Glucose, Bld: 95 mg/dL (ref 65–99)
Potassium: 4 mmol/L (ref 3.5–5.1)
Sodium: 141 mmol/L (ref 135–145)
Total Bilirubin: 0.8 mg/dL (ref 0.3–1.2)
Total Protein: 7.2 g/dL (ref 6.5–8.1)

## 2016-10-26 MED ORDER — LIDOCAINE-EPINEPHRINE (PF) 1 %-1:200000 IJ SOLN
INTRAMUSCULAR | Status: AC
  Filled 2016-10-26: qty 30

## 2016-10-26 MED ORDER — BACITRACIN ZINC 500 UNIT/GM EX OINT
TOPICAL_OINTMENT | CUTANEOUS | Status: AC
  Filled 2016-10-26: qty 28.35

## 2016-10-26 NOTE — Progress Notes (Signed)
Here for follow up

## 2016-10-27 ENCOUNTER — Encounter
Admission: RE | Disposition: A | Discharge status: Home / Self Care | Payer: Self-pay | Source: Ambulatory Visit | Attending: Cardiothoracic Surgery

## 2016-10-27 ENCOUNTER — Ambulatory Visit: Payer: Medicare Other | Admitting: Anesthesiology

## 2016-10-27 ENCOUNTER — Other Ambulatory Visit: Payer: Medicare Other

## 2016-10-27 ENCOUNTER — Ambulatory Visit: Payer: Medicare Other

## 2016-10-27 ENCOUNTER — Encounter (INDEPENDENT_AMBULATORY_CARE_PROVIDER_SITE_OTHER): Payer: Medicare Other | Admitting: Vascular Surgery

## 2016-10-27 ENCOUNTER — Encounter: Payer: Self-pay | Admitting: *Deleted

## 2016-10-27 ENCOUNTER — Ambulatory Visit: Payer: Medicare Other | Admitting: Oncology

## 2016-10-27 ENCOUNTER — Ambulatory Visit
Admission: RE | Admit: 2016-10-27 | Discharge: 2016-10-27 | Disposition: A | Discharge status: Home / Self Care | Payer: Medicare Other | Source: Ambulatory Visit | Attending: Cardiothoracic Surgery | Admitting: Cardiothoracic Surgery

## 2016-10-27 DIAGNOSIS — Z79899 Other long term (current) drug therapy: Secondary | ICD-10-CM | POA: Insufficient documentation

## 2016-10-27 DIAGNOSIS — Z09 Encounter for follow-up examination after completed treatment for conditions other than malignant neoplasm: Secondary | ICD-10-CM

## 2016-10-27 DIAGNOSIS — K219 Gastro-esophageal reflux disease without esophagitis: Secondary | ICD-10-CM | POA: Diagnosis not present

## 2016-10-27 DIAGNOSIS — C50919 Malignant neoplasm of unspecified site of unspecified female breast: Secondary | ICD-10-CM

## 2016-10-27 DIAGNOSIS — Z853 Personal history of malignant neoplasm of breast: Secondary | ICD-10-CM

## 2016-10-27 DIAGNOSIS — Z7982 Long term (current) use of aspirin: Secondary | ICD-10-CM | POA: Diagnosis not present

## 2016-10-27 DIAGNOSIS — Z452 Encounter for adjustment and management of vascular access device: Secondary | ICD-10-CM | POA: Diagnosis not present

## 2016-10-27 DIAGNOSIS — C50911 Malignant neoplasm of unspecified site of right female breast: Secondary | ICD-10-CM | POA: Diagnosis not present

## 2016-10-27 DIAGNOSIS — E785 Hyperlipidemia, unspecified: Secondary | ICD-10-CM | POA: Insufficient documentation

## 2016-10-27 DIAGNOSIS — C50912 Malignant neoplasm of unspecified site of left female breast: Secondary | ICD-10-CM | POA: Insufficient documentation

## 2016-10-27 DIAGNOSIS — C50412 Malignant neoplasm of upper-outer quadrant of left female breast: Secondary | ICD-10-CM | POA: Diagnosis not present

## 2016-10-27 DIAGNOSIS — E669 Obesity, unspecified: Secondary | ICD-10-CM | POA: Insufficient documentation

## 2016-10-27 DIAGNOSIS — Z01810 Encounter for preprocedural cardiovascular examination: Secondary | ICD-10-CM | POA: Diagnosis not present

## 2016-10-27 DIAGNOSIS — Z7951 Long term (current) use of inhaled steroids: Secondary | ICD-10-CM | POA: Insufficient documentation

## 2016-10-27 HISTORY — PX: PORTACATH PLACEMENT: SHX2246

## 2016-10-27 SURGERY — INSERTION, TUNNELED CENTRAL VENOUS DEVICE, WITH PORT
Anesthesia: General | Laterality: Right | Wound class: Clean

## 2016-10-27 MED ORDER — LIDOCAINE HCL (CARDIAC) 20 MG/ML IV SOLN
INTRAVENOUS | Status: DC | PRN
  Administered 2016-10-27: 80 mg via INTRAVENOUS

## 2016-10-27 MED ORDER — EPHEDRINE SULFATE 50 MG/ML IJ SOLN
INTRAMUSCULAR | Status: DC | PRN
  Administered 2016-10-27: 20 mg via INTRAVENOUS
  Administered 2016-10-27: 15 mg via INTRAVENOUS

## 2016-10-27 MED ORDER — FENTANYL CITRATE (PF) 100 MCG/2ML IJ SOLN: 25.0000 ug | INTRAMUSCULAR | Status: DC | PRN

## 2016-10-27 MED ORDER — PROMETHAZINE HCL 25 MG/ML IJ SOLN
6.2500 mg | INTRAMUSCULAR | Status: DC | PRN
Start: 2016-10-27 — End: 2016-10-27

## 2016-10-27 MED ORDER — ATROPINE SULFATE 0.4 MG/ML IV SOSY
PREFILLED_SYRINGE | INTRAVENOUS | Status: AC
  Filled 2016-10-27: qty 2.5

## 2016-10-27 MED ORDER — VANCOMYCIN HCL 500 MG IV SOLR
500.0000 mg | INTRAVENOUS | Status: AC
  Administered 2016-10-27: 500 mg via INTRAVENOUS
  Filled 2016-10-27: qty 500

## 2016-10-27 MED ORDER — DEXAMETHASONE SODIUM PHOSPHATE 10 MG/ML IJ SOLN
INTRAMUSCULAR | Status: DC | PRN
  Administered 2016-10-27: 10 mg via INTRAVENOUS

## 2016-10-27 MED ORDER — PHENYLEPHRINE HCL 10 MG/ML IJ SOLN
INTRAMUSCULAR | Status: DC | PRN
  Administered 2016-10-27 (×2): 100 ug via INTRAVENOUS

## 2016-10-27 MED ORDER — PROPOFOL 10 MG/ML IV BOLUS
INTRAVENOUS | Status: DC | PRN
  Administered 2016-10-27: 120 mg via INTRAVENOUS

## 2016-10-27 MED ORDER — ONDANSETRON HCL 4 MG/2ML IJ SOLN
INTRAMUSCULAR | Status: DC | PRN
  Administered 2016-10-27: 4 mg via INTRAVENOUS

## 2016-10-27 MED ORDER — ATROPINE SULFATE 0.4 MG/ML IJ SOLN
INTRAMUSCULAR | Status: AC
  Filled 2016-10-27: qty 1

## 2016-10-27 MED ORDER — OXYCODONE HCL 5 MG/5ML PO SOLN: 5.0000 mg | Freq: Once | ORAL | Status: DC | PRN

## 2016-10-27 MED ORDER — PROPOFOL 10 MG/ML IV BOLUS
INTRAVENOUS | Status: AC
  Filled 2016-10-27: qty 20

## 2016-10-27 MED ORDER — OXYCODONE HCL 5 MG PO TABS: 5.0000 mg | ORAL_TABLET | Freq: Once | ORAL | Status: DC | PRN

## 2016-10-27 MED ORDER — ATROPINE SULFATE 0.4 MG/ML IJ SOLN
INTRAMUSCULAR | Status: DC | PRN
  Administered 2016-10-27: 0.2 mg via INTRAVENOUS

## 2016-10-27 MED ORDER — GLYCOPYRROLATE 0.2 MG/ML IJ SOLN
INTRAMUSCULAR | Status: DC | PRN
  Administered 2016-10-27: .2 mg via INTRAVENOUS

## 2016-10-27 MED ORDER — SODIUM CHLORIDE 0.9 % IV SOLN
INTRAVENOUS | Status: DC | PRN
  Administered 2016-10-27: 100 mL via INTRAMUSCULAR

## 2016-10-27 MED ORDER — LACTATED RINGERS IV SOLN
INTRAVENOUS | Status: DC
  Administered 2016-10-27 (×2): via INTRAVENOUS

## 2016-10-27 MED ORDER — ACETAMINOPHEN 10 MG/ML IV SOLN
INTRAVENOUS | Status: AC
  Filled 2016-10-27: qty 100

## 2016-10-27 MED ORDER — ATROPINE SULFATE 1 MG/10ML IJ SOSY
PREFILLED_SYRINGE | INTRAMUSCULAR | Status: AC
  Filled 2016-10-27: qty 10

## 2016-10-27 MED ORDER — LIDOCAINE HCL (PF) 1 % IJ SOLN
INTRAMUSCULAR | Status: AC
  Filled 2016-10-27: qty 30

## 2016-10-27 MED ORDER — FENTANYL CITRATE (PF) 100 MCG/2ML IJ SOLN
INTRAMUSCULAR | Status: DC | PRN
  Administered 2016-10-27 (×2): 50 ug via INTRAVENOUS

## 2016-10-27 MED ORDER — HEPARIN SODIUM (PORCINE) 5000 UNIT/ML IJ SOLN
INTRAMUSCULAR | Status: AC
  Filled 2016-10-27: qty 1

## 2016-10-27 MED ORDER — FENTANYL CITRATE (PF) 100 MCG/2ML IJ SOLN
INTRAMUSCULAR | Status: AC
Start: 2016-10-27 — End: 2016-10-27
  Filled 2016-10-27: qty 2

## 2016-10-27 MED ORDER — VANCOMYCIN HCL IN DEXTROSE 1-5 GM/200ML-% IV SOLN: 1000.0000 mg | INTRAVENOUS | Status: DC

## 2016-10-27 MED ORDER — LIDOCAINE HCL (PF) 2 % IJ SOLN
INTRAMUSCULAR | Status: AC
  Filled 2016-10-27: qty 2

## 2016-10-27 MED ORDER — LIDOCAINE HCL (PF) 1 % IJ SOLN
INTRAMUSCULAR | Status: DC | PRN
  Administered 2016-10-27: 6 mL

## 2016-10-27 MED ORDER — ONDANSETRON HCL 4 MG/2ML IJ SOLN
INTRAMUSCULAR | Status: AC
  Filled 2016-10-27: qty 2

## 2016-10-27 MED ORDER — MEPERIDINE HCL 50 MG/ML IJ SOLN: 6.2500 mg | INTRAMUSCULAR | Status: DC | PRN

## 2016-10-27 MED ORDER — MIDAZOLAM HCL 2 MG/2ML IJ SOLN
INTRAMUSCULAR | Status: AC
  Filled 2016-10-27: qty 2

## 2016-10-27 SURGICAL SUPPLY — 44 items
BAG DECANTER FOR FLEXI CONT (MISCELLANEOUS) ×2 IMPLANT
BLADE SURG 15 STRL LF DISP TIS (BLADE) ×1 IMPLANT
BLADE SURG 15 STRL SS (BLADE) ×1
BLADE SURG SZ11 CARB STEEL (BLADE) ×4 IMPLANT
CANISTER SUCT 1200ML W/VALVE (MISCELLANEOUS) ×2 IMPLANT
CHLORAPREP W/TINT 26ML (MISCELLANEOUS) ×2 IMPLANT
COVER LIGHT HANDLE STERIS (MISCELLANEOUS) ×4 IMPLANT
DERMABOND ADVANCED (GAUZE/BANDAGES/DRESSINGS) ×1
DERMABOND ADVANCED .7 DNX12 (GAUZE/BANDAGES/DRESSINGS) ×1 IMPLANT
DRAPE C-ARM XRAY 36X54 (DRAPES) ×2 IMPLANT
DRSG TEGADERM 2-3/8X2-3/4 SM (GAUZE/BANDAGES/DRESSINGS) ×2 IMPLANT
DRSG TEGADERM 4X4.75 (GAUZE/BANDAGES/DRESSINGS) ×2 IMPLANT
DRSG TELFA 4X3 1S NADH ST (GAUZE/BANDAGES/DRESSINGS) ×2 IMPLANT
ELECT CAUTERY BLADE TIP 2.5 (TIP) ×2
ELECT REM PT RETURN 9FT ADLT (ELECTROSURGICAL) ×2
ELECTRODE CAUTERY BLDE TIP 2.5 (TIP) ×1 IMPLANT
ELECTRODE REM PT RTRN 9FT ADLT (ELECTROSURGICAL) ×1 IMPLANT
GLOVE SURG SYN 7.5  E (GLOVE) ×5
GLOVE SURG SYN 7.5 E (GLOVE) ×5 IMPLANT
GOWN STRL REUS W/ TWL LRG LVL3 (GOWN DISPOSABLE) ×3 IMPLANT
GOWN STRL REUS W/TWL LRG LVL3 (GOWN DISPOSABLE) ×3
IV NS 500ML (IV SOLUTION) ×1
IV NS 500ML BAXH (IV SOLUTION) ×1 IMPLANT
KIT PORT POWER 8FR ISP CVUE (Catheter) ×2 IMPLANT
KIT RM TURNOVER STRD PROC AR (KITS) ×2 IMPLANT
LABEL OR SOLS (LABEL) ×2 IMPLANT
MARKER SKIN DUAL TIP RULER LAB (MISCELLANEOUS) ×2 IMPLANT
NDL SAFETY 22GX1.5 (NEEDLE) ×2 IMPLANT
NEEDLE FILTER BLUNT 18X 1/2SAF (NEEDLE) ×1
NEEDLE FILTER BLUNT 18X1 1/2 (NEEDLE) ×1 IMPLANT
NS IRRIG 500ML POUR BTL (IV SOLUTION) ×2 IMPLANT
PACK PORT-A-CATH (MISCELLANEOUS) ×2 IMPLANT
SUT ETHILON 4-0 (SUTURE) ×2
SUT ETHILON 4-0 FS2 18XMFL BLK (SUTURE) ×2
SUT MNCRL 4-0 (SUTURE) ×1
SUT MNCRL 4-0 27XMFL (SUTURE) ×1
SUT PROLENE 2 0 SH DA (SUTURE) ×8 IMPLANT
SUT VIC AB 3-0 SH 27 (SUTURE) ×1
SUT VIC AB 3-0 SH 27X BRD (SUTURE) ×1 IMPLANT
SUTURE ETHLN 4-0 FS2 18XMF BLK (SUTURE) ×2 IMPLANT
SUTURE MNCRL 4-0 27XMF (SUTURE) ×1 IMPLANT
SYR 3ML LL SCALE MARK (SYRINGE) ×2 IMPLANT
SYRINGE 10CC LL (SYRINGE) ×2 IMPLANT
TAPE TRANSPORE STRL 2 31045 (GAUZE/BANDAGES/DRESSINGS) ×2 IMPLANT

## 2016-10-27 NOTE — Op Note (Signed)
10/27/2016  2:36 PM  PATIENT:  Maria Patterson  76 y.o. female  PRE-OPERATIVE DIAGNOSIS:  BREAST CANCER  POST-OPERATIVE DIAGNOSIS:  BREAST CANCER  PROCEDURE:  Procedure(s): INSERTION PORT-A-CATH (Right)  SURGEON:  Surgeon(s) and Role:    * Nestor Lewandowsky, MD - Primary  ASSISTANTS:    ANESTHESIA:  LMA plus local   DICTATION:   The patient was brought to the operating suite and placed in the supine position. Using the ultrasound machine the right IJ was identified and suitable for cannulation. The patient was then prepped and draped in usual sterile fashion. The Rigth IJ was percutaneously catheterized. A wire was placed into the venous system under fluoroscopic guidance. An appropriate site was selected on the chest wall and a Port-A-Cath pocket was created. The catheter was tunneled from the port site up to the insertion site. The catheter was then inserted through a peel-away sheath and positioned at the appropriate level in the superior vena cava. The catheter was then assembled and aspirated and flushed easily. It was then secured to the anterior chest wall with interrupted Prolene sutures. Fluoroscopy showed the catheter to be tortuous in the subcutaneous tissues so the catheter was shortened slightly and resecured to the chest wall with interupted Prolene sutures. The catheter was flushed one last time, fluoroscopy confirmed the catheter to be in good position without kinks and the wounds were then closed. The subcutaneous tissues were closed with running absorbable sutures and the skin with nylon. Sterile dressings were applied. Patient was then transported to the recovery room in stable condition.   Nestor Lewandowsky, MD

## 2016-10-27 NOTE — Discharge Instructions (Signed)
AMBULATORY SURGERY  DISCHARGE INSTRUCTIONS   1) The drugs that you were given will stay in your system until tomorrow so for the next 24 hours you should not:  A) Drive an automobile B) Make any legal decisions C) Drink any alcoholic beverage   2) You may resume regular meals tomorrow.  Today it is better to start with liquids and gradually work up to solid foods.  You may eat anything you prefer, but it is better to start with liquids, then soup and crackers, and gradually work up to solid foods.   3) Please notify your doctor immediately if you have any unusual bleeding, trouble breathing, redness and pain at the surgery site, drainage, fever, or pain not relieved by medication.    Please contact your physician with any problems or Same Day Surgery at [phone], Monday through Friday 6 am to 4 pm, or [place] [facility] number at [phone].   Implanted Port Insertion, Care After This sheet gives you information about how to care for yourself after your procedure. Your health care provider may also give you more specific instructions. If you have problems or questions, contact your health care provider. What can I expect after the procedure? After your procedure, it is common to have:  Discomfort at the port insertion site.  Bruising on the skin over the port. This should improve over 3-4 days. Follow these instructions at home: [facility] [place] care   After your port is placed, you will get a manufacturer's information card. The card has information about your port. Keep this card with you at all times.  Take care of the port as told by your health care provider. Ask your health care provider if you or a family member can get training for taking care of the port at home. A home health care nurse may also take care of the port.  Make sure to remember what type of port you have. Incision care   Follow instructions from your health care provider about how to take care of  your port insertion site. Make sure you:  Wash your hands with soap and water before you change your bandage (dressing). If soap and water are not available, use hand sanitizer.  Change your dressing as told by your health care provider.  Leave stitches (sutures), skin glue, or adhesive strips in place. These skin closures may need to stay in place for 2 weeks or longer. If adhesive strip edges start to loosen and curl up, you may trim the loose edges. Do not remove adhesive strips completely unless your health care provider tells you to do that.  Check your port insertion site every day for signs of infection. Check for:  More redness, swelling, or pain.  More fluid or blood.  Warmth.  Pus or a bad smell. General instructions   Do not take baths, swim, or use a hot tub until your health care provider approves.  Do not lift anything that is heavier than 10 lb (4.5 kg) for a week, or as told by your health care provider.  Ask your health care provider when it is okay to:  Return to work or school.  Resume usual physical activities or sports.  Do not drive for 24 hours if you were given a medicine to help you relax (sedative).  Take over-the-counter and prescription medicines only as told by your health care provider.  Wear a medical alert bracelet in case of an emergency. This will tell any health care  providers that you have a port.  Keep all follow-up visits as told by your health care provider. This is important. Contact a health care provider if:  You cannot flush your port with saline as directed, or you cannot draw blood from the port.  You have a fever or chills.  You have more redness, swelling, or pain around your port insertion site.  You have more fluid or blood coming from your port insertion site.  Your port insertion site feels warm to the touch.  You have pus or a bad smell coming from the port insertion site. Get help right away if:  You have chest  pain or shortness of breath.  You have bleeding from your port that you cannot control. Summary  Take care of the port as told by your health care provider.  Change your dressing as told by your health care provider.  Keep all follow-up visits as told by your health care provider. This information is not intended to replace advice given to you by your health care provider. Make sure you discuss any questions you have with your health care provider. Document Released: 04/26/2013 Document Revised: 05/27/2016 Document Reviewed: 05/27/2016 Elsevier Interactive Patient Education  2017 [employer].

## 2016-10-27 NOTE — Anesthesia Procedure Notes (Signed)
Procedure Name: LMA Insertion Date/Time: 10/27/2016 12:57 PM Performed by: [facility] Pre-anesthesia Checklist: Patient identified, Patient being monitored, Timeout performed, Emergency Drugs available and Suction available Patient Re-evaluated:Patient Re-evaluated prior to inductionOxygen Delivery Method: Circle system utilized Preoxygenation: Pre-oxygenation with 100% oxygen Intubation Type: IV induction Ventilation: Mask ventilation without difficulty LMA: LMA inserted LMA Size: 3.5 Tube type: Oral Number of attempts: 1 Placement Confirmation: positive ETCO2 and breath sounds checked- equal and bilateral Tube secured with: Tape Dental Injury: Teeth and Oropharynx as per pre-operative assessment

## 2016-10-27 NOTE — Anesthesia Post-op Follow-up Note (Cosign Needed)
Anesthesia QCDR form completed.        

## 2016-10-27 NOTE — Interval H&P Note (Signed)
History and Physical Interval Note:  10/27/2016 12:39 PM  Maria Patterson  has presented today for surgery, with the diagnosis of BREAST CANCER  The various methods of treatment have been discussed with the patient and family. After consideration of risks, benefits and other options for treatment, the patient has consented to  Procedure(s): INSERTION PORT-A-CATH (N/A) as a surgical intervention .  The patient's history has been reviewed, patient examined, no change in status, stable for surgery.  I have reviewed the patient's chart and labs.  Questions were answered to the patient's satisfaction.     Nestor Lewandowsky

## 2016-10-27 NOTE — Anesthesia Preprocedure Evaluation (Signed)
Anesthesia Evaluation  Patient identified by MRN, date of birth, ID band Patient awake    Reviewed: Allergy & Precautions, NPO status , Patient's Chart, lab work & pertinent test results  History of Anesthesia Complications Negative for: history of anesthetic complications  Airway Mallampati: II  TM Distance: >3 FB Neck ROM: Full    Dental no notable dental hx.    Pulmonary neg pulmonary ROS, neg sleep apnea, neg COPD,    breath sounds clear to auscultation- rhonchi (-) wheezing      Cardiovascular Exercise Tolerance: Good (-) hypertension(-) CAD and (-) Past MI  Rhythm:Regular Rate:Normal - Systolic murmurs and - Diastolic murmurs    Neuro/Psych negative neurological ROS  negative psych ROS   GI/Hepatic Neg liver ROS, GERD  ,  Endo/Other  negative endocrine ROSneg diabetes  Renal/GU negative Renal ROS     Musculoskeletal negative musculoskeletal ROS (+)   Abdominal (+) + obese,   Peds  Hematology negative hematology ROS (+)   Anesthesia Other Findings Past Medical History: 09/22/2016: Breast mass, left     Comment: RECOMMENDATION: Ultrasound-guided core               biopsies of masses at left breast 2 o'clock 2               cm from nipple, left breast 2 o'clock 5 cm from              nipple, abnormal left axillary lymph nodes. No date: GERD (gastroesophageal reflux disease) No date: Hyperglycemia No date: Hyperlipidemia No date: Obesity No date: Postmenopausal'  Reproductive/Obstetrics                             Anesthesia Physical Anesthesia Plan  ASA: II  Anesthesia Plan: General   Post-op Pain Management:    Induction: Intravenous  Airway Management Planned: LMA  Additional Equipment:   Intra-op Plan:   Post-operative Plan:   Informed Consent: I have reviewed the patients History and Physical, chart, labs and discussed the procedure including the risks, benefits  and alternatives for the proposed anesthesia with the patient or authorized representative who has indicated his/her understanding and acceptance.   Dental advisory given  Plan Discussed with: CRNA and Anesthesiologist  Anesthesia Plan Comments:         Anesthesia Quick Evaluation

## 2016-10-27 NOTE — H&P (View-Only) (Signed)
  Patient ID: Maria Patterson, female   DOB: [birth year], 76 y.o.   MRN: [id number]  HISTORY: This patient is a 76 year old African-American female who discovered a left breast mass on self-exam. She's been extensively evaluated by oncology and surgery and she requires a Port-A-Cath for administration chemotherapy. She saw Dr. per square earlier this week who unfortunately could not place her Port-A-Cath until after her scheduled chemotherapy start date. Therefore she comes in today to meet with me and to discuss the indications for Port-A-Cath placement. I reviewed with her the indications and risks. Risks of bleeding, infection, pneumothorax were all discussed. I explained to her that the catheters usually placed in the internal jugular position. However this be dependent upon an ultrasound performed at the time of her procedure.   Vitals:   10/23/16 1016  BP: 129/81  Pulse: 74  Resp: 14  Temp: 98.2 F (36.8 C)     EXAM:    Resp: Lungs are clear bilaterally.  No respiratory distress, normal effort. Heart:  Regular without murmurs Abd:  Abdomen is soft, non distended and non tender. No masses are palpable.  There is no rebound and no guarding.  Neurological: Alert and oriented to person, place, and time. Coordination normal.  Skin: Skin is warm and dry. No rash noted. No diaphoretic. No erythema. No pallor.  Psychiatric: Normal mood and affect. Normal behavior. Judgment and thought content normal.    ASSESSMENT: Left-sided breast cancer with the need for intravenous access for chemotherapy   PLAN:   Insertion of Port-A-Cath. She will see our preoperative clinic today and will have a port placed next week. All questions were answered.    Nestor Lewandowsky, MD

## 2016-10-27 NOTE — Transfer of Care (Signed)
Immediate Anesthesia Transfer of Care Note  Patient: Maria Patterson  Procedure(s) Performed: Procedure(s): INSERTION PORT-A-CATH (Right)  Patient Location: PACU  Anesthesia Type:General  Level of Consciousness: sedated  Airway & Oxygen Therapy: Patient Spontanous Breathing and Patient connected to face mask oxygen  Post-op Assessment: Report given to RN and Post -op Vital signs reviewed and stable  Post vital signs: Reviewed and stable  Last Vitals:  Vitals:   10/27/16 1213  BP: (!) 146/57  Pulse: 70  Resp: 18  Temp: 36.6 C    Last Pain:  Vitals:   10/27/16 1213  TempSrc: Oral         Complications: No apparent anesthesia complications

## 2016-10-27 NOTE — Anesthesia Postprocedure Evaluation (Signed)
Anesthesia Post Note  Patient: Steele Ledonne  Procedure(s) Performed: Procedure(s) (LRB): INSERTION PORT-A-CATH (Right)  Patient location during evaluation: PACU Anesthesia Type: General Level of consciousness: awake and alert and oriented Pain management: pain level controlled Vital Signs Assessment: post-procedure vital signs reviewed and stable Respiratory status: spontaneous breathing, nonlabored ventilation and respiratory function stable Cardiovascular status: blood pressure returned to baseline and stable Postop Assessment: no signs of nausea or vomiting Anesthetic complications: no     Last Vitals:  Vitals:   10/27/16 1213 10/27/16 1441  BP: (!) 146/57 (!) 149/82  Pulse: 70 (!) 107  Resp: 18 19  Temp: 36.6 C 36.5 C    Last Pain:  Vitals:   10/27/16 1213  TempSrc: Oral                 Gibril Mastro

## 2016-10-28 ENCOUNTER — Encounter: Payer: Self-pay | Admitting: Cardiothoracic Surgery

## 2016-10-28 ENCOUNTER — Telehealth: Payer: Self-pay | Admitting: Internal Medicine

## 2016-10-28 ENCOUNTER — Telehealth: Payer: Self-pay | Admitting: General Practice

## 2016-10-28 ENCOUNTER — Inpatient Hospital Stay: Payer: Medicare Other

## 2016-10-28 ENCOUNTER — Other Ambulatory Visit: Payer: Self-pay | Admitting: Internal Medicine

## 2016-10-28 VITALS — BP 136/77 | HR 75 | Temp 98.0°F | Resp 20

## 2016-10-28 DIAGNOSIS — E785 Hyperlipidemia, unspecified: Secondary | ICD-10-CM | POA: Diagnosis not present

## 2016-10-28 DIAGNOSIS — C50412 Malignant neoplasm of upper-outer quadrant of left female breast: Secondary | ICD-10-CM

## 2016-10-28 DIAGNOSIS — Z171 Estrogen receptor negative status [ER-]: Secondary | ICD-10-CM | POA: Diagnosis not present

## 2016-10-28 DIAGNOSIS — I700 Atherosclerosis of aorta: Secondary | ICD-10-CM | POA: Diagnosis not present

## 2016-10-28 DIAGNOSIS — Z5111 Encounter for antineoplastic chemotherapy: Secondary | ICD-10-CM | POA: Diagnosis not present

## 2016-10-28 DIAGNOSIS — N281 Cyst of kidney, acquired: Secondary | ICD-10-CM | POA: Diagnosis not present

## 2016-10-28 DIAGNOSIS — Z79899 Other long term (current) drug therapy: Secondary | ICD-10-CM | POA: Diagnosis not present

## 2016-10-28 DIAGNOSIS — D72829 Elevated white blood cell count, unspecified: Secondary | ICD-10-CM | POA: Diagnosis not present

## 2016-10-28 DIAGNOSIS — L03818 Cellulitis of other sites: Secondary | ICD-10-CM | POA: Diagnosis not present

## 2016-10-28 DIAGNOSIS — Z5112 Encounter for antineoplastic immunotherapy: Secondary | ICD-10-CM | POA: Diagnosis not present

## 2016-10-28 DIAGNOSIS — Z7982 Long term (current) use of aspirin: Secondary | ICD-10-CM | POA: Diagnosis not present

## 2016-10-28 DIAGNOSIS — N2889 Other specified disorders of kidney and ureter: Secondary | ICD-10-CM | POA: Diagnosis not present

## 2016-10-28 DIAGNOSIS — R739 Hyperglycemia, unspecified: Secondary | ICD-10-CM | POA: Diagnosis not present

## 2016-10-28 DIAGNOSIS — K219 Gastro-esophageal reflux disease without esophagitis: Secondary | ICD-10-CM | POA: Diagnosis not present

## 2016-10-28 MED ORDER — DOCETAXEL CHEMO INJECTION 160 MG/16ML
60.0000 mg/m2 | Freq: Once | INTRAVENOUS | Status: AC
  Administered 2016-10-28: 110 mg via INTRAVENOUS
  Filled 2016-10-28: qty 11

## 2016-10-28 MED ORDER — HEPARIN SOD (PORK) LOCK FLUSH 100 UNIT/ML IV SOLN
500.0000 [IU] | Freq: Once | INTRAVENOUS | Status: AC | PRN
  Administered 2016-10-28: 500 [IU]
  Filled 2016-10-28: qty 5

## 2016-10-28 MED ORDER — PEGFILGRASTIM 6 MG/0.6ML ~~LOC~~ PSKT
6.0000 mg | PREFILLED_SYRINGE | Freq: Once | SUBCUTANEOUS | Status: AC
  Administered 2016-10-28: 6 mg via SUBCUTANEOUS
  Filled 2016-10-28: qty 0.6

## 2016-10-28 MED ORDER — DEXAMETHASONE SODIUM PHOSPHATE 10 MG/ML IJ SOLN
10.0000 mg | Freq: Once | INTRAMUSCULAR | Status: AC
  Administered 2016-10-28: 10 mg via INTRAVENOUS
  Filled 2016-10-28: qty 1

## 2016-10-28 MED ORDER — DIPHENHYDRAMINE HCL 25 MG PO CAPS
25.0000 mg | ORAL_CAPSULE | Freq: Once | ORAL | Status: AC
  Administered 2016-10-28: 25 mg via ORAL
  Filled 2016-10-28: qty 1

## 2016-10-28 MED ORDER — TRASTUZUMAB CHEMO 150 MG IV SOLR
600.0000 mg | Freq: Once | INTRAVENOUS | Status: AC
  Administered 2016-10-28: 600 mg via INTRAVENOUS
  Filled 2016-10-28: qty 28.57

## 2016-10-28 MED ORDER — SODIUM CHLORIDE 0.9 % IV SOLN
840.0000 mg | Freq: Once | INTRAVENOUS | Status: AC
  Administered 2016-10-28: 840 mg via INTRAVENOUS
  Filled 2016-10-28: qty 28

## 2016-10-28 MED ORDER — SODIUM CHLORIDE 0.9 % IV SOLN: 10.0000 mg | Freq: Once | INTRAVENOUS | Status: DC

## 2016-10-28 MED ORDER — PALONOSETRON HCL INJECTION 0.25 MG/5ML
0.2500 mg | Freq: Once | INTRAVENOUS | Status: AC
  Administered 2016-10-28: 0.25 mg via INTRAVENOUS
  Filled 2016-10-28: qty 5

## 2016-10-28 MED ORDER — SODIUM CHLORIDE 0.9% FLUSH
10.0000 mL | INTRAVENOUS | Status: DC | PRN
  Administered 2016-10-28: 10 mL
  Filled 2016-10-28: qty 10

## 2016-10-28 MED ORDER — SODIUM CHLORIDE 0.9 % IV SOLN
Freq: Once | INTRAVENOUS | Status: DC
  Filled 2016-10-28: qty 1000

## 2016-10-28 MED ORDER — ACETAMINOPHEN 325 MG PO TABS
650.0000 mg | ORAL_TABLET | Freq: Once | ORAL | Status: AC
  Administered 2016-10-28: 650 mg via ORAL
  Filled 2016-10-28: qty 2

## 2016-10-28 MED ORDER — SODIUM CHLORIDE 0.9 % IV SOLN
Freq: Once | INTRAVENOUS | Status: AC
  Administered 2016-10-28: 09:00:00 via INTRAVENOUS
  Filled 2016-10-28: qty 1000

## 2016-10-28 MED ORDER — SODIUM CHLORIDE 0.9 % IV SOLN
400.5000 mg | Freq: Once | INTRAVENOUS | Status: AC
  Administered 2016-10-28: 400 mg via INTRAVENOUS
  Filled 2016-10-28: qty 40

## 2016-10-28 NOTE — Telephone Encounter (Signed)
Given age/ reduced carbo to AUC 5; taxotere to 60mg /m2 for this cycle. Dr.B

## 2016-10-28 NOTE — Telephone Encounter (Signed)
Ok, thanks.

## 2016-10-28 NOTE — Telephone Encounter (Signed)
Patient called and left a message patient was here on 10/23/16 patient called said she had a porter catheter , but is having a hard time urinating would you please call patient and advice.

## 2016-10-29 NOTE — Telephone Encounter (Signed)
s/w patient letting her know she needed to call her pcp, patient stated everything was fine.

## 2016-10-29 NOTE — Telephone Encounter (Signed)
Please have patient contact her PCP in regards to this. Her inability to urinate does not pretain to her port insertion.

## 2016-10-29 NOTE — Progress Notes (Signed)
  Oncology Nurse Navigator Documentation  Navigator Location: [facility] (10/29/16 1400)   )Navigator Encounter Type: Telephone;Treatment (10/29/16 1400) Telephone: Maria Patterson Call;Clinic/[facility] Follow-up (10/29/16 1400)                     Treatment Phase: First Chemo Tx (10/29/16 1400) Barriers/Navigation Needs: No Questions (10/29/16 1400)   Interventions: Coordination of Care (10/29/16 1400)   Coordination of Care: Appts (10/29/16 1400) Education Method: Verbal;Teach-back (10/29/16 1400)            Acuity Level 3: Ongoing guidance and education provided throughout treatment (10/29/16 1400)   Time Spent with Patient: 15 (10/29/16 1400)   Patient doing well, and reports no adverse side effect from first chemotherapy treatment yesterday.  Confirmed follow-up appointments.

## 2016-11-01 ENCOUNTER — Emergency Department
Admission: EM | Admit: 2016-11-01 | Discharge: 2016-11-01 | Disposition: A | Discharge status: Home / Self Care | Payer: Medicare Other | Attending: Emergency Medicine | Admitting: Emergency Medicine

## 2016-11-01 DIAGNOSIS — Z853 Personal history of malignant neoplasm of breast: Secondary | ICD-10-CM | POA: Diagnosis not present

## 2016-11-01 DIAGNOSIS — L03313 Cellulitis of chest wall: Secondary | ICD-10-CM | POA: Insufficient documentation

## 2016-11-01 DIAGNOSIS — Z7982 Long term (current) use of aspirin: Secondary | ICD-10-CM | POA: Diagnosis not present

## 2016-11-01 DIAGNOSIS — Z79899 Other long term (current) drug therapy: Secondary | ICD-10-CM | POA: Insufficient documentation

## 2016-11-01 DIAGNOSIS — R222 Localized swelling, mass and lump, trunk: Secondary | ICD-10-CM | POA: Diagnosis present

## 2016-11-01 LAB — URINALYSIS, COMPLETE (UACMP) WITH MICROSCOPIC
BILIRUBIN URINE: NEGATIVE
Bacteria, UA: NONE SEEN
GLUCOSE, UA: NEGATIVE mg/dL
HGB URINE DIPSTICK: NEGATIVE
Ketones, ur: NEGATIVE mg/dL
Leukocytes, UA: NEGATIVE
NITRITE: NEGATIVE
Protein, ur: NEGATIVE mg/dL
SPECIFIC GRAVITY, URINE: 1.01 (ref 1.005–1.030)
pH: 6 (ref 5.0–8.0)

## 2016-11-01 LAB — COMPREHENSIVE METABOLIC PANEL
ALK PHOS: 106 U/L (ref 38–126)
ALT: 23 U/L (ref 14–54)
AST: 27 U/L (ref 15–41)
Albumin: 3.3 g/dL — ABNORMAL LOW (ref 3.5–5.0)
Anion gap: 7 (ref 5–15)
BILIRUBIN TOTAL: 0.8 mg/dL (ref 0.3–1.2)
BUN: 16 mg/dL (ref 6–20)
CALCIUM: 9 mg/dL (ref 8.9–10.3)
CO2: 22 mmol/L (ref 22–32)
Chloride: 111 mmol/L (ref 101–111)
Creatinine, Ser: 0.84 mg/dL (ref 0.44–1.00)
Glucose, Bld: 89 mg/dL (ref 65–99)
Potassium: 4 mmol/L (ref 3.5–5.1)
Sodium: 140 mmol/L (ref 135–145)
Total Protein: 6.5 g/dL (ref 6.5–8.1)

## 2016-11-01 LAB — CBC WITH DIFFERENTIAL/PLATELET
Basophils Absolute: 0.1 10*3/uL (ref 0–0.1)
Basophils Relative: 1 %
EOS ABS: 0.3 10*3/uL (ref 0–0.7)
Eosinophils Relative: 2 %
HCT: 33 % — ABNORMAL LOW (ref 35.0–47.0)
HEMOGLOBIN: 11.2 g/dL — AB (ref 12.0–16.0)
LYMPHS ABS: 1.6 10*3/uL (ref 1.0–3.6)
Lymphocytes Relative: 11 %
MCH: 29.8 pg (ref 26.0–34.0)
MCHC: 33.8 g/dL (ref 32.0–36.0)
MCV: 88.3 fL (ref 80.0–100.0)
Monocytes Absolute: 0.1 10*3/uL — ABNORMAL LOW (ref 0.2–0.9)
Monocytes Relative: 1 %
NEUTROS PCT: 85 %
Neutro Abs: 12.7 10*3/uL — ABNORMAL HIGH (ref 1.4–6.5)
PLATELETS: 146 10*3/uL — AB (ref 150–440)
RBC: 3.74 MIL/uL — AB (ref 3.80–5.20)
RDW: 14.5 % (ref 11.5–14.5)
WBC: 14.8 10*3/uL — AB (ref 3.6–11.0)

## 2016-11-01 MED ORDER — CLINDAMYCIN HCL 150 MG PO CAPS
300.0000 mg | ORAL_CAPSULE | Freq: Once | ORAL | Status: AC
  Administered 2016-11-01: 300 mg via ORAL
  Filled 2016-11-01: qty 2

## 2016-11-01 MED ORDER — CLINDAMYCIN HCL 150 MG PO CAPS
ORAL_CAPSULE | ORAL | Status: AC
  Filled 2016-11-01: qty 1

## 2016-11-01 MED ORDER — CLINDAMYCIN HCL 300 MG PO CAPS: 300.0000 mg | ORAL_CAPSULE | Freq: Three times a day (TID) | ORAL | 0 refills | Status: DC

## 2016-11-01 NOTE — Discharge Instructions (Signed)
You're being treated with clindamycin antibiotic for skin infection, cellulitis. Return to emergency department immediately for any worsening redness or swelling or pain or drainage, fever, or any other symptoms concerning to you.

## 2016-11-01 NOTE — ED Triage Notes (Signed)
Pt presents via POV c/o pain at post surgical site s/p port placement. Pt reports having port placed last Wednesday. Pt states also has chemo the following day. Pt c/o pain and swelling first noticed this am at incision. Also report urinary hesitancy. Reports "not flowing right".

## 2016-11-01 NOTE — ED Provider Notes (Signed)
[facility] Emergency Department Provider Note ____________________________________________   I have reviewed the triage vital signs and the triage nursing note.  HISTORY  Chief Complaint Post-op Problem   Historian Patient and daughter with whom she lives  HPI Maria Patterson is a 76 y.o. female presents for evaluation of swelling and redness at the site of her recent Port-A-Cath placement at her right chest wall. She woke up with it feeling swollen and noticed a slight amount of redness. No systemic complaints of nausea or fevers or vomiting or chest pain or abdominal pain.  She started her first chemotherapy on Wednesday after port was placed on Tuesday. She reports some urinary hesitancy.    Past Medical History:  Diagnosis Date  . Breast mass, left 09/22/2016   RECOMMENDATION: Ultrasound-guided core biopsies of masses at left breast 2 o'clock 2 cm from nipple, left breast 2 o'clock 5 cm from nipple, abnormal left axillary lymph nodes.  Marland Kitchen GERD (gastroesophageal reflux disease)   . Hyperglycemia   . Hyperlipidemia   . Obesity   . Postmenopausal     Patient Active Problem List   Diagnosis Date Noted  . 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 Breast mass, left 09/22/2016  . Encounter for screening for HIV 09/18/2016  . Preventative health care 09/18/2016  . Osteoporosis screening 09/18/2016  . Postmenopausal 09/18/2016  . Ache in joint 03/09/2016  . Overweight 03/09/2016  . Medication monitoring encounter 10/30/2015  . Hyperglycemia 09/10/2015  . Needs flu shot 09/10/2015  . Need for pneumococcal vaccination 09/10/2015  . GERD without esophagitis 07/01/2012  . Hyperlipidemia LDL goal <100 07/01/2012    Past Surgical History:  Procedure Laterality Date  . ABDOMINAL HYSTERECTOMY    . BREAST BIOPSY Left 09/30/2016   US biopsy of 3 areas, awaiting pathology  . ESOPHAGOGASTRODUODENOSCOPY (EGD) WITH  PROPOFOL N/A 12/24/2014   Procedure: ESOPHAGOGASTRODUODENOSCOPY (EGD) WITH PROPOFOL;  Surgeon: Manya Silvas, MD;  Location: [facility] ENDOSCOPY;  Service: Endoscopy;  Laterality: N/A;  . PORTACATH PLACEMENT Right 10/27/2016   Procedure: INSERTION PORT-A-CATH;  Surgeon: Nestor Lewandowsky, MD;  Location: [facility] ORS;  Service: General;  Laterality: Right;  . TONSILECTOMY, ADENOIDECTOMY, BILATERAL MYRINGOTOMY AND TUBES      Prior to Admission medications   Medication Sig Start Date End Date Taking? Authorizing Provider  acetaminophen (TYLENOL) 500 MG tablet Take 1,000 mg by mouth every 6 (six) hours as needed for mild pain.    Historical Provider, MD  aspirin 81 MG tablet Take 81 mg by mouth daily.    Historical Provider, MD  atorvastatin (LIPITOR) 40 MG tablet Take 1 tablet (40 mg total) by mouth at bedtime. Patient taking differently: Take 40 mg by mouth 3 (three) times a week. In the evening  Depends on patient's preference if takes 20-40 mg 09/07/16   Arnetha Courser, MD  Cholecalciferol (VITAMIN D) 2000 units tablet Take 2,000 Units by mouth daily. One by mouth twice a week 09/01/16   Arnetha Courser, MD  clindamycin (CLEOCIN) 300 MG capsule Take 1 capsule (300 mg total) by mouth 3 (three) times daily. 11/01/16   Lisa Roca, MD  ferrous sulfate 325 (65 FE) MG EC tablet Take 325 mg by mouth every other day.  09/01/16   Arnetha Courser, MD  fluticasone (FLONASE) 50 MCG/ACT nasal spray Place 1 spray into both nostrils daily as needed for allergies or rhinitis.    Historical Provider, MD  lidocaine-prilocaine (EMLA) cream Apply to  affected area once Patient not taking: Reported on 10/27/2016 10/20/16   Lloyd Huger, MD  Multiple Vitamin (MULTIVITAMIN WITH MINERALS) TABS tablet Take 1 tablet by mouth every other day.  09/01/16   Arnetha Courser, MD  omeprazole (PRILOSEC) 20 MG capsule Take 20 mg by mouth daily as needed (heartburn).     Historical Provider, MD  ondansetron (ZOFRAN) 8 MG tablet Take 1 tablet (8 mg  total) by mouth 2 (two) times daily as needed for refractory nausea / vomiting. 10/20/16   Lloyd Huger, MD  prochlorperazine (COMPAZINE) 10 MG tablet Take 1 tablet (10 mg total) by mouth every 6 (six) hours as needed (Nausea or vomiting). 10/20/16   Lloyd Huger, MD    Allergies  Allergen Reactions  . Latex Itching  . Penicillins Itching and Rash    Has patient had a PCN reaction causing immediate rash, facial/tongue/throat swelling, SOB or lightheadedness with hypotension: Yes Has patient had a PCN reaction causing severe rash involving mucus membranes or skin necrosis: No Has patient had a PCN reaction that required hospitalization No Has patient had a PCN reaction occurring within the last 10 years: No If all of the above answers are "NO", then may proceed with Cephalosporin use.     Family History  Problem Relation Age of Onset  . Stroke Mother   . Stroke Father   . Cancer Neg Hx   . Depression Neg Hx     Social History Social History  Substance Use Topics  . Smoking status: Never Smoker  . Smokeless tobacco: Never Used  . Alcohol use No    Review of Systems  Constitutional: Negative for fever. Eyes: Negative for visual changes. ENT: Negative for sore throat. Cardiovascular: Negative for chest pain. Respiratory: Negative for shortness of breath. Gastrointestinal: Negative for abdominal pain, vomiting and diarrhea. Genitourinary: urinary hesitancy without dysuria. Musculoskeletal: Negative for back pain. Skin: mild redness at the incision site on the right chest wall for the Port-A-Cath Neurological: Negative for headache. 10 point Review of Systems otherwise negative ____________________________________________   PHYSICAL EXAM:  VITAL SIGNS: ED Triage Vitals  Enc Vitals Group     BP 11/01/16 0738 138/72     Pulse Rate 11/01/16 0738 (!) 110     Resp 11/01/16 0738 14     Temp 11/01/16 0738 99.1 F (37.3 C)     Temp Source 11/01/16 0738 Oral      SpO2 11/01/16 0738 96 %     Weight 11/01/16 0738 164 lb (74.4 kg)     Height 11/01/16 0738 5\' 1"  (1.549 m)     Head Circumference --      Peak Flow --      Pain Score 11/01/16 0737 4     Pain Loc --      Pain Edu? --      Excl. in [place]? --      Constitutional: Alert and oriented. Well appearing and in no distress. HEENT   Head: Normocephalic and atraumatic.      Eyes: Conjunctivae are normal. PERRL. Normal extraocular movements.      Ears:         Nose: No congestion/rhinnorhea.   Mouth/Throat: Mucous membranes are moist.   Neck: No stridor.  Small healing suture mark at the right neck. Cardiovascular/Chest: Tachycardic, regular rhythm.  No murmurs, rubs, or gallops. Respiratory: Normal respiratory effort without tachypnea nor retractions. Breath sounds are clear and equal bilaterally. No wheezes/rales/rhonchi. Gastrointestinal: Soft. No distention, no  guarding, no rebound. Nontender.   Genitourinary/rectal:Deferred Musculoskeletal: Nontender with normal range of motion in all extremities. No joint effusions.  No lower extremity tenderness.  No edema. Neurologic:  Normal speech and language. No gross or focal neurologic deficits are appreciated. Skin:  Incision with stitches in place on the right chest wall from Port-A-Cath placement. Slight amount of erythema without any fluctuance or blistering or dehiscence. Psychiatric: Mood and affect are normal. Speech and behavior are normal. Patient exhibits appropriate insight and judgment.   ____________________________________________  LABS (pertinent positives/negatives)  Labs Reviewed  COMPREHENSIVE METABOLIC PANEL - Abnormal; Notable for the following:       Result Value   Albumin 3.3 (*)    All other components within normal limits  CBC WITH DIFFERENTIAL/PLATELET - Abnormal; Notable for the following:    WBC 14.8 (*)    RBC 3.74 (*)    Hemoglobin 11.2 (*)    HCT 33.0 (*)    Platelets 146 (*)    Neutro Abs 12.7 (*)     Monocytes Absolute 0.1 (*)    All other components within normal limits  URINALYSIS, COMPLETE (UACMP) WITH MICROSCOPIC - Abnormal; Notable for the following:    Color, Urine YELLOW (*)    APPearance CLEAR (*)    Squamous Epithelial / LPF 0-5 (*)    All other components within normal limits  URINE CULTURE  CULTURE, BLOOD (ROUTINE X 2)  CULTURE, BLOOD (ROUTINE X 2)  PATHOLOGIST SMEAR REVIEW    ____________________________________________    EKG I, Lisa Roca, MD, the attending physician have personally viewed and interpreted all ECGs.  none ____________________________________________  RADIOLOGY All Xrays were viewed by me. Imaging interpreted by Radiologist.  none __________________________________________  PROCEDURES  Procedure(s) performed: None  Critical Care performed: None  ____________________________________________   ED COURSE / ASSESSMENT AND PLAN  Pertinent labs & imaging results that were available during my care of the patient were reviewed by me and considered in my medical decision making (see chart for details).   Ms. Mordecai complaint of swelling and redness at the site of the Port-A-Cath clinically is not that impressive at all. There is a slight amount of redness, and I will go ahead and cover for cellulitis.  Heart temperature is low-grade here and pulse elevated, and given recent chemotherapy, and complaint of urinary hesitancy, I recommended laboratory studies and urinalysis.  White blood count 14, uncertain etiology of this.  Urinalysis has white blood cells, but no other findings consistent with UTI. I will send a culture.  She is not complaining of any respiratory symptoms.  I cannot find easily documented whether she had any wbc boosting treatment with her first chemotherapy cycle.  Daughter states that she did receive Neulasta.  She looks well clinically.  Chose clindamycin for coverage of mrsa and avoidance of bactrim in African  American.  Repeat temperature 97.6 orally.  Heart rate in the 80s.    CONSULTATIONS:   None   Patient / Family / Caregiver informed of clinical course, medical decision-making process, and agree with plan.   I discussed return precautions, follow-up instructions, and discharge instructions with patient and/or family.  Discharge instructions:You're being treated with clindamycin antibiotic for skin infection, cellulitis. Return to emergency department immediately for any worsening redness or swelling or pain or drainage, fever, or any other symptoms concerning to you. ___________________________________________   FINAL CLINICAL IMPRESSION(S) / ED DIAGNOSES   Final diagnoses:  Cellulitis of chest wall  Note: This dictation was prepared with Dragon dictation. Any transcriptional errors that result from this process are unintentional    Lisa Roca, MD 11/01/16 1157

## 2016-11-01 NOTE — ED Notes (Signed)
Pt states port placed last Tuesday and received her first chemo treatment on wed. Pt now having pain at incision sites. Pt also states she feels like her heartbeat is "fast". Pt on monitor and pt currently in NSR.

## 2016-11-01 NOTE — ED Notes (Signed)
Pt verbalized understanding of discharge instructions. NAD at this time. 

## 2016-11-02 LAB — PATHOLOGIST SMEAR REVIEW

## 2016-11-03 LAB — URINE CULTURE

## 2016-11-03 NOTE — Progress Notes (Signed)
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  INTERVAL HISTORY: Patient returns to clinic today for further evaluation and to assess her toleration of cycle 1 of neoadjuvant Taxotere, carboplatin, Herceptin, and Perjeta. She was treated in the emergency room for cellulitis around her port site, but otherwise feels well. She tolerated treatment without significant side effects. She continues to be anxious. She has no neurologic complaints. She denies any recent fevers or illnesses. She has a good appetite and denies weight loss. She has no chest pain or shortness of breath. She denies any nausea, vomiting, constipation, or diarrhea. She has no urinary complaints. Patient offers no further specific complaints today.  REVIEW OF SYSTEMS:   Review of Systems  Constitutional: Negative.  Negative for fever, malaise/fatigue and weight loss.  Respiratory: Negative.  Negative for cough.   Cardiovascular: Negative.  Negative for chest pain and leg swelling.  Gastrointestinal: Negative.  Negative for abdominal pain.  Genitourinary: Negative.   Musculoskeletal: Negative.   Neurological: Negative.  Negative for weakness.  Psychiatric/Behavioral: The patient is nervous/anxious.     As per HPI. Otherwise, a complete review of systems is negative.  PAST MEDICAL HISTORY: Past Medical History:  Diagnosis Date  . Breast mass, left 09/22/2016   RECOMMENDATION: Ultrasound-guided core biopsies of masses at left breast 2 o'clock 2 cm from nipple, left breast 2 o'clock 5 cm from nipple, abnormal left axillary lymph nodes.  Marland Kitchen GERD (gastroesophageal reflux disease)   . Hyperglycemia   . Hyperlipidemia   .  Obesity   . Postmenopausal     PAST SURGICAL HISTORY: Past Surgical History:  Procedure Laterality Date  . ABDOMINAL HYSTERECTOMY    . BREAST BIOPSY Left 09/30/2016   US biopsy of 3 areas, awaiting pathology  . ESOPHAGOGASTRODUODENOSCOPY (EGD) WITH PROPOFOL N/A 12/24/2014   Procedure: ESOPHAGOGASTRODUODENOSCOPY (EGD) WITH PROPOFOL;  Surgeon: Manya Silvas, MD;  Location: [facility] ENDOSCOPY;  Service: Endoscopy;  Laterality: N/A;  . PORTACATH PLACEMENT Right 10/27/2016   Procedure: INSERTION PORT-A-CATH;  Surgeon: Nestor Lewandowsky, MD;  Location: [facility] ORS;  Service: General;  Laterality: Right;  . TONSILECTOMY, ADENOIDECTOMY, BILATERAL MYRINGOTOMY AND TUBES      FAMILY HISTORY: Family History  Problem Relation Age of Onset  . Stroke Mother   . Stroke Father   . Cancer Neg Hx   . Depression Neg Hx     ADVANCED DIRECTIVES (Y/N):  N  HEALTH MAINTENANCE: Social History  Substance Use Topics  . Smoking status: Never Smoker  . Smokeless tobacco: Never Used  . Alcohol use No     Colonoscopy:  PAP:  Bone density:  Lipid panel:  Allergies  Allergen Reactions  . Latex Itching  . Penicillins Itching and Rash    Has patient had a PCN reaction causing immediate rash, facial/tongue/throat swelling, SOB or lightheadedness with hypotension: Yes Has patient had a PCN reaction causing severe rash involving mucus membranes or skin necrosis: No Has patient had a PCN reaction that required hospitalization No Has patient had a PCN reaction occurring within the last 10 years: No If all of the above answers are "NO", then may proceed with Cephalosporin use.     Current Outpatient Prescriptions  Medication Sig Dispense Refill  . acetaminophen (TYLENOL)  500 MG tablet Take 1,000 mg by mouth every 6 (six) hours as needed for mild pain.    Marland Kitchen aspirin 81 MG tablet Take 81 mg by mouth daily.    Marland Kitchen atorvastatin (LIPITOR) 40 MG tablet Take 1 tablet (40 mg total) by mouth at bedtime. (Patient taking  differently: Take 40 mg by mouth 3 (three) times a week. In the evening  Depends on patient's preference if takes 20-40 mg) 30 tablet 1  . Cholecalciferol (VITAMIN D) 2000 units tablet Take 2,000 Units by mouth daily. One by mouth twice a week    . clindamycin (CLEOCIN) 300 MG capsule Take 1 capsule (300 mg total) by mouth 3 (three) times daily. 30 capsule 0  . ferrous sulfate 325 (65 FE) MG EC tablet Take 325 mg by mouth every other day.     . fluticasone (FLONASE) 50 MCG/ACT nasal spray Place 1 spray into both nostrils daily as needed for allergies or rhinitis.    Marland Kitchen lidocaine-prilocaine (EMLA) cream Apply to affected area once 30 g 3  . Multiple Vitamin (MULTIVITAMIN WITH MINERALS) TABS tablet Take 1 tablet by mouth every other day.     Marland Kitchen omeprazole (PRILOSEC) 20 MG capsule Take 20 mg by mouth daily as needed (heartburn).     . ondansetron (ZOFRAN) 8 MG tablet Take 1 tablet (8 mg total) by mouth 2 (two) times daily as needed for refractory nausea / vomiting. 60 tablet 2  . prochlorperazine (COMPAZINE) 10 MG tablet Take 1 tablet (10 mg total) by mouth every 6 (six) hours as needed (Nausea or vomiting). (Patient not taking: Reported on 11/06/2016) 60 tablet 2   No current facility-administered medications for this visit.     OBJECTIVE: Vitals:   11/04/16 1203  BP: 127/74  Pulse: (!) 102  Resp: 18  Temp: 98.1 F (36.7 C)     Body mass index is 31.84 kg/m.    ECOG FS:0 - Asymptomatic  General: Well-developed, well-nourished, no acute distress. Eyes: Pink conjunctiva, anicteric sclera. Breasts: Easily palpable left breast mass. Exam deferred today. Chest wall: Port site without erythema or induration. Lungs: Clear to auscultation bilaterally. Heart: Regular rate and rhythm. No rubs, murmurs, or gallops. Abdomen: Soft, nontender, nondistended. No organomegaly noted, normoactive bowel sounds. Musculoskeletal: No edema, cyanosis, or clubbing. Neuro: Alert, answering all questions  appropriately. Cranial nerves grossly intact. Skin: No rashes or petechiae noted. Psych: Normal affect.  LAB RESULTS:  Lab Results  Component Value Date   NA 139 11/04/2016   K 4.0 11/04/2016   CL 111 11/04/2016   CO2 22 11/04/2016   GLUCOSE 100 (H) 11/04/2016   BUN 23 (H) 11/04/2016   CREATININE 1.26 (H) 11/04/2016   CALCIUM 9.2 11/04/2016   PROT 6.9 11/04/2016   ALBUMIN 3.8 11/04/2016   AST 32 11/04/2016   ALT 25 11/04/2016   ALKPHOS 148 (H) 11/04/2016   BILITOT 0.3 11/04/2016   GFRNONAA 40 (L) 11/04/2016   GFRAA 46 (L) 11/04/2016    Lab Results  Component Value Date   WBC 28.8 (H) 11/04/2016   NEUTROABS 23.3 (H) 11/04/2016   HGB 11.4 (L) 11/04/2016   HCT 34.0 (L) 11/04/2016   MCV 87.6 11/04/2016   PLT 188 11/04/2016     STUDIES: Dg Chest 2 View  Result Date: 10/23/2016 CLINICAL DATA:  Left breast mass EXAM: CHEST  2 VIEW COMPARISON:  10/20/2016 and 12/11/2011 FINDINGS: Cardiomediastinal silhouette is stable. No infiltrate or pleural effusion. No pulmonary edema. Mild elevation of the right hemidiaphragm  again noted. Bony thorax is unremarkable. IMPRESSION: No active cardiopulmonary disease. Electronically Signed   By: Lahoma Crocker M.D.   On: 10/23/2016 14:01   Nm Cardiac Muga Rest  Result Date: 10/16/2016 CLINICAL DATA:  LEFT breast cancer, pretreatment assessment of cardiac function EXAM: NUCLEAR MEDICINE CARDIAC BLOOD POOL IMAGING (MUGA) TECHNIQUE: Cardiac multi-gated acquisition was performed at rest following intravenous injection of Tc-44mlabeled red blood cells. RADIOPHARMACEUTICALS:  20.72 mCi Tc-984mertechnetate in-vitro labeled autologous red blood cells IV COMPARISON:  None FINDINGS: LEFT ventricular ejection fraction is calculated at 65%, within the normal range. Study was obtained at a heart rate of 60 beats per minute. Cine analysis of the gated blood pool in 3 projections demonstrates normal LEFT ventricular wall motion. IMPRESSION: Normal LEFT ventricular  ejection fraction 65%. Normal LV wall motion. Electronically Signed   By: MaLavonia Dana.D.   On: 10/16/2016 13:42   Nm Pet Image Initial (pi) Skull Base To Thigh  Result Date: 10/20/2016 CLINICAL DATA:  Initial treatment strategy for left upper outer quadrant breast carcinoma. EXAM: NUCLEAR MEDICINE PET SKULL BASE TO THIGH TECHNIQUE: 12.2 mCi F-18 FDG was injected intravenously. Full-ring PET imaging was performed from the skull base to thigh after the radiotracer. CT data was obtained and used for attenuation correction and anatomic localization. FASTING BLOOD GLUCOSE:  Value: 83 mg/dl COMPARISON:  AP CT on 03/01/2012 FINDINGS: NECK No hypermetabolic lymph nodes in the neck. CHEST 2.9 cm mass in the upper outer quadrant left breast is hypermetabolic, with SUV max of 22.2. This is consistent with known primary breast carcinoma. Hypermetabolic left axillary and subpectoral lymphadenopathy is seen. These measure less than 1 cm but have SUV max of 16.5, consistent with metastatic lymphadenopathy. No hypermetabolic mediastinal or hilar nodes. No suspicious pulmonary nodules on the CT scan. ABDOMEN/PELVIS No abnormal hypermetabolic activity within the liver, pancreas, adrenal glands, or spleen. Stable small benign right renal cysts. Shotty mesenteric lymph nodes are seen, largest measuring 8 mm on image 153/4 with SUV max of 4.7. No pathologically enlarged lymph nodes identified. Prior hysterectomy noted. Adnexal regions are unremarkable in appearance. Aortic atherosclerosis. SKELETON No focal hypermetabolic activity to suggest skeletal metastasis. IMPRESSION: Hypermetabolic 3 cm mass in upper outer quadrant left breast, consistent with known primary breast carcinoma. Hypermetabolic left axillary and subpectoral lymphadenopathy, consistent with metastatic disease. Shotty less than 1 cm mesenteric lymph nodes with FDG uptake. Differential diagnosis includes inflammatory etiologies and small lymph node metastases.  Recommend continued attention on follow-up CT. Aortic atherosclerosis. Electronically Signed   By: JoEarle Gell.D.   On: 10/20/2016 13:23   Dg Chest Port 1 View  Result Date: 10/27/2016 CLINICAL DATA:  Status post Port-A-Cath insertion. EXAM: PORTABLE CHEST 1 VIEW COMPARISON:  PA and lateral chest 10/23/2016. FINDINGS: Right IJ approach Port-A-Cath is in place with the tip projecting in the mid to lower superior vena cava. No pneumothorax. Lung volumes are low with some basilar atelectasis. No pleural effusion. Heart size is upper normal. Aortic atherosclerosis is noted. IMPRESSION: Port-A-Cath tip projects in the mid to lower superior vena cava. Negative for pneumothorax or other acute abnormality. Electronically Signed   By: ThInge Rise.D.   On: 10/27/2016 14:58   Dg C-arm 1-60 Min-no Report  Result Date: 10/27/2016 Fluoroscopy was utilized by the requesting physician.  No radiographic interpretation.    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  PLAN:    1. 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 Patient will then require surgery followed by adjuvant XRT. Patient will receive Herceptin only every 3 weeks for an entire year after her surgery. An aromatase inhibitor would not offer benefit given the ER/PR negativity of her disease. Pretreatment MUGA scan is adequate to proceed with an EF of 65%. PET scan results reviewed independently and reported as above with no obvious metastatic disease other than her known disease in breast and axilla. Patient tolerated of cycle 1 of 6 of neoadjuvant treatment last week. Return to clinic in 2 weeks for further evaluation and consideration of cycle 2. 2. Port  cellulitis: Improved. Continue antibiotics as prescribed. 3. Leukocytosis: Secondary to Neulasta. Monitor.  4. Renal insufficiency: Mild, monitor.   Patient expressed understanding and was in agreement with this plan. She also understands that She can call clinic at any time with any questions, concerns, or complaints.   Cancer Staging Malignant neoplasm of upper-outer quadrant of left female breast [facility]) Staging form: Breast, AJCC 8th Edition - Clinical stage from 10/11/2016: Stage IIB (cT2, cN1, cM0, G3, ER: Negative, PR: Negative, HER2: Positive) - Signed by Lloyd Huger, MD on 10/11/2016   Lloyd Huger, MD   11/10/2016 10:38 AM

## 2016-11-04 ENCOUNTER — Inpatient Hospital Stay (HOSPITAL_BASED_OUTPATIENT_CLINIC_OR_DEPARTMENT_OTHER): Payer: Medicare Other | Admitting: Oncology

## 2016-11-04 ENCOUNTER — Inpatient Hospital Stay: Payer: Medicare Other

## 2016-11-04 VITALS — BP 127/74 | HR 102 | Temp 98.1°F | Resp 18 | Wt 168.5 lb

## 2016-11-04 DIAGNOSIS — Z171 Estrogen receptor negative status [ER-]: Secondary | ICD-10-CM

## 2016-11-04 DIAGNOSIS — N281 Cyst of kidney, acquired: Secondary | ICD-10-CM | POA: Diagnosis not present

## 2016-11-04 DIAGNOSIS — Z7982 Long term (current) use of aspirin: Secondary | ICD-10-CM | POA: Diagnosis not present

## 2016-11-04 DIAGNOSIS — Z79899 Other long term (current) drug therapy: Secondary | ICD-10-CM | POA: Diagnosis not present

## 2016-11-04 DIAGNOSIS — K219 Gastro-esophageal reflux disease without esophagitis: Secondary | ICD-10-CM | POA: Diagnosis not present

## 2016-11-04 DIAGNOSIS — E669 Obesity, unspecified: Secondary | ICD-10-CM | POA: Diagnosis not present

## 2016-11-04 DIAGNOSIS — I700 Atherosclerosis of aorta: Secondary | ICD-10-CM

## 2016-11-04 DIAGNOSIS — N2889 Other specified disorders of kidney and ureter: Secondary | ICD-10-CM

## 2016-11-04 DIAGNOSIS — L03818 Cellulitis of other sites: Secondary | ICD-10-CM

## 2016-11-04 DIAGNOSIS — E785 Hyperlipidemia, unspecified: Secondary | ICD-10-CM

## 2016-11-04 DIAGNOSIS — C50412 Malignant neoplasm of upper-outer quadrant of left female breast: Secondary | ICD-10-CM

## 2016-11-04 DIAGNOSIS — R739 Hyperglycemia, unspecified: Secondary | ICD-10-CM

## 2016-11-04 DIAGNOSIS — Z5112 Encounter for antineoplastic immunotherapy: Secondary | ICD-10-CM | POA: Diagnosis not present

## 2016-11-04 DIAGNOSIS — Z5111 Encounter for antineoplastic chemotherapy: Secondary | ICD-10-CM | POA: Diagnosis not present

## 2016-11-04 DIAGNOSIS — D72829 Elevated white blood cell count, unspecified: Secondary | ICD-10-CM | POA: Diagnosis not present

## 2016-11-04 DIAGNOSIS — F419 Anxiety disorder, unspecified: Secondary | ICD-10-CM

## 2016-11-04 DIAGNOSIS — Z90710 Acquired absence of both cervix and uterus: Secondary | ICD-10-CM

## 2016-11-04 LAB — CBC WITH DIFFERENTIAL/PLATELET
Basophils Absolute: 0 10*3/uL (ref 0–0.1)
Basophils Relative: 0 %
EOS PCT: 1 %
Eosinophils Absolute: 0.3 10*3/uL (ref 0–0.7)
HCT: 34 % — ABNORMAL LOW (ref 35.0–47.0)
Hemoglobin: 11.4 g/dL — ABNORMAL LOW (ref 12.0–16.0)
LYMPHS PCT: 14 %
Lymphs Abs: 4 10*3/uL — ABNORMAL HIGH (ref 1.0–3.6)
MCH: 29.3 pg (ref 26.0–34.0)
MCHC: 33.4 g/dL (ref 32.0–36.0)
MCV: 87.6 fL (ref 80.0–100.0)
MONO ABS: 1.2 10*3/uL — AB (ref 0.2–0.9)
MONOS PCT: 4 %
NEUTROS PCT: 81 %
Neutro Abs: 23.3 10*3/uL — ABNORMAL HIGH (ref 1.4–6.5)
PLATELETS: 188 10*3/uL (ref 150–440)
RBC: 3.88 MIL/uL (ref 3.80–5.20)
RDW: 14.6 % — ABNORMAL HIGH (ref 11.5–14.5)
WBC: 28.8 10*3/uL — AB (ref 3.6–11.0)

## 2016-11-04 LAB — COMPREHENSIVE METABOLIC PANEL
ALK PHOS: 148 U/L — AB (ref 38–126)
ALT: 25 U/L (ref 14–54)
AST: 32 U/L (ref 15–41)
Albumin: 3.8 g/dL (ref 3.5–5.0)
Anion gap: 6 (ref 5–15)
BUN: 23 mg/dL — AB (ref 6–20)
CHLORIDE: 111 mmol/L (ref 101–111)
CO2: 22 mmol/L (ref 22–32)
Calcium: 9.2 mg/dL (ref 8.9–10.3)
Creatinine, Ser: 1.26 mg/dL — ABNORMAL HIGH (ref 0.44–1.00)
GFR calc Af Amer: 46 mL/min — ABNORMAL LOW (ref >60)
GFR, EST NON AFRICAN AMERICAN: 40 mL/min — AB (ref >60)
Glucose, Bld: 100 mg/dL — ABNORMAL HIGH (ref 65–99)
Potassium: 4 mmol/L (ref 3.5–5.1)
Sodium: 139 mmol/L (ref 135–145)
Total Bilirubin: 0.3 mg/dL (ref 0.3–1.2)
Total Protein: 6.9 g/dL (ref 6.5–8.1)

## 2016-11-04 NOTE — Progress Notes (Signed)
Recently evaluated in ED for pain around port site. Treated for cellulitis around port. Pt states that is feeling slightly better today.

## 2016-11-05 ENCOUNTER — Ambulatory Visit: Payer: Medicare Other | Admitting: Family Medicine

## 2016-11-06 ENCOUNTER — Encounter: Payer: Self-pay | Admitting: Family Medicine

## 2016-11-06 ENCOUNTER — Ambulatory Visit (INDEPENDENT_AMBULATORY_CARE_PROVIDER_SITE_OTHER): Payer: Medicare Other | Admitting: Vascular Surgery

## 2016-11-06 ENCOUNTER — Encounter: Payer: Medicare Other | Admitting: Cardiothoracic Surgery

## 2016-11-06 ENCOUNTER — Ambulatory Visit (INDEPENDENT_AMBULATORY_CARE_PROVIDER_SITE_OTHER): Payer: Medicare Other | Admitting: Family Medicine

## 2016-11-06 ENCOUNTER — Encounter (INDEPENDENT_AMBULATORY_CARE_PROVIDER_SITE_OTHER): Payer: Self-pay | Admitting: Vascular Surgery

## 2016-11-06 VITALS — BP 124/60 | HR 97 | Temp 98.7°F | Resp 16 | Wt 167.4 lb

## 2016-11-06 VITALS — BP 153/78 | HR 92 | Resp 16 | Ht 61.0 in | Wt 168.0 lb

## 2016-11-06 DIAGNOSIS — I10 Essential (primary) hypertension: Secondary | ICD-10-CM

## 2016-11-06 DIAGNOSIS — Z171 Estrogen receptor negative status [ER-]: Secondary | ICD-10-CM | POA: Diagnosis not present

## 2016-11-06 DIAGNOSIS — E785 Hyperlipidemia, unspecified: Secondary | ICD-10-CM

## 2016-11-06 DIAGNOSIS — J3089 Other allergic rhinitis: Secondary | ICD-10-CM | POA: Diagnosis not present

## 2016-11-06 DIAGNOSIS — C50412 Malignant neoplasm of upper-outer quadrant of left female breast: Secondary | ICD-10-CM

## 2016-11-06 DIAGNOSIS — R739 Hyperglycemia, unspecified: Secondary | ICD-10-CM

## 2016-11-06 DIAGNOSIS — I70213 Atherosclerosis of native arteries of extremities with intermittent claudication, bilateral legs: Secondary | ICD-10-CM | POA: Diagnosis not present

## 2016-11-06 DIAGNOSIS — L03313 Cellulitis of chest wall: Secondary | ICD-10-CM

## 2016-11-06 DIAGNOSIS — R748 Abnormal levels of other serum enzymes: Secondary | ICD-10-CM | POA: Diagnosis not present

## 2016-11-06 LAB — CULTURE, BLOOD (ROUTINE X 2)
CULTURE: NO GROWTH
CULTURE: NO GROWTH
SPECIAL REQUESTS: ADEQUATE
Special Requests: ADEQUATE

## 2016-11-06 NOTE — Progress Notes (Signed)
BP 124/60   Pulse 97   Temp 98.7 F (37.1 C) (Oral)   Resp 16   Wt 167 lb 6.4 oz (75.9 kg)   SpO2 97%   BMI 31.63 kg/m    Subjective:    Patient ID: Maria Patterson, female    DOB: [birth year], 76 y.o.   MRN: [id number]  HPI: Maria Patterson is a 76 y.o. female  Chief Complaint  Patient presents with  . Follow-up    HPI  She has breast cancer, Dr. Grayland Ormond is managing the treatment for that Her energy level is pretty good  Hypertension; well-controlled; use some salt every now and then; just a little; not using decongestants; plain tylenol for aches, but not tylenol and cold and sinus  Allergies, nasal corticosteroid  Prediabetes; last A1c was beautiful 5.3 in February  High cholesterol; really high cholesterol; LDL 233 down to 192 last check; just taking 3 times a week  Alkaline phosphatase was a little elevated after starting treatment; monitored by cancer center; little bit of nausea and has medicine for that  Antibiotics are for cellulitis; chest wall along site of port incision; she says it is getting better; has covered with bandage but removed for exam  Depression screen [facility] 2/9 11/06/2016 09/18/2016 09/01/2016 03/09/2016 09/10/2015  Decreased Interest 0 0 0 0 0  Down, Depressed, Hopeless 0 0 0 1 0  PHQ - 2 Score 0 0 0 1 0    Relevant past medical, surgical, family and social history reviewed Past Medical History:  Diagnosis Date  . Breast mass, left 09/22/2016   RECOMMENDATION: Ultrasound-guided core biopsies of masses at left breast 2 o'clock 2 cm from nipple, left breast 2 o'clock 5 cm from nipple, abnormal left axillary lymph nodes.  Marland Kitchen GERD (gastroesophageal reflux disease)   . Hyperglycemia   . Hyperlipidemia   . Obesity   . Postmenopausal    Past Surgical History:  Procedure Laterality Date  . ABDOMINAL HYSTERECTOMY    . BREAST BIOPSY Left 09/30/2016   US biopsy of 3 areas, awaiting pathology  . ESOPHAGOGASTRODUODENOSCOPY (EGD) WITH PROPOFOL N/A  12/24/2014   Procedure: ESOPHAGOGASTRODUODENOSCOPY (EGD) WITH PROPOFOL;  Surgeon: Manya Silvas, MD;  Location: [facility] ENDOSCOPY;  Service: Endoscopy;  Laterality: N/A;  . PORTACATH PLACEMENT Right 10/27/2016   Procedure: INSERTION PORT-A-CATH;  Surgeon: Nestor Lewandowsky, MD;  Location: [facility] ORS;  Service: General;  Laterality: Right;  . TONSILECTOMY, ADENOIDECTOMY, BILATERAL MYRINGOTOMY AND TUBES     Family History  Problem Relation Age of Onset  . Stroke Mother   . Stroke Father   . Cancer Neg Hx   . Depression Neg Hx    Social History  Substance Use Topics  . Smoking status: Never Smoker  . Smokeless tobacco: Never Used  . Alcohol use No    Interim medical history since last visit reviewed. Allergies and medications reviewed  Review of Systems Per HPI unless specifically indicated above     Objective:    BP 124/60   Pulse 97   Temp 98.7 F (37.1 C) (Oral)   Resp 16   Wt 167 lb 6.4 oz (75.9 kg)   SpO2 97%   BMI 31.63 kg/m   Wt Readings from Last 3 Encounters:  11/06/16 167 lb 6.4 oz (75.9 kg)  11/06/16 168 lb (76.2 kg)  11/04/16 168 lb 8 oz (76.4 kg)    Physical Exam  Constitutional: She appears well-developed and well-nourished. No distress.  HENT:  Head: Normocephalic and atraumatic.  Eyes:  EOM are normal. No scleral icterus.  Neck: No thyromegaly present.  Cardiovascular: Normal rate, regular rhythm and normal heart sounds.   No murmur heard. Pulmonary/Chest: Effort normal and breath sounds normal. No respiratory distress. She has no wheezes.  Abdominal: Soft. Bowel sounds are normal. She exhibits no distension.  Musculoskeletal: Normal range of motion. She exhibits no edema.  Neurological: She is alert. She exhibits normal muscle tone.  Skin: Skin is warm and dry. She is not diaphoretic. No pallor.     Sutures intact, mild erythema around chest wall incision site, no fluctuance; no drainage  Psychiatric: She has a normal mood and affect. Her behavior is  normal. Judgment and thought content normal.      Assessment & Plan:   Problem List Items Addressed This Visit      Cardiovascular and Mediastinum   Essential hypertension, benign    Well-controlled        Respiratory   Allergic rhinitis    Nasal corticosteroid; avoid decongestants        Other   Malignant neoplasm of upper-outer quadrant of left female breast ([place])    Under the care of oncologist; support offered      Hyperlipidemia LDL goal <100 - Primary    She is battling breast cancer, and having multiple labs drawn; I will not draw labs today, but asked if she could to have her lipid panel drawn at one of her upcoming blood draws through the cancer center; orders in system; try to limit saturated fats      Hyperglycemia    Glucose level was 100; monitor periodically      Alkaline phosphatase elevation    Monitored by cancer center doctor       Other Visit Diagnoses    Cellulitis of chest wall       antibiotics; improved clinically       Follow up plan: Return for twenty minute follow-up with fasting labs, 3-4 months.  An after-visit summary was printed and given to the patient at [facility].  Please see the patient instructions which may contain other information and recommendations beyond what is mentioned above in the assessment and plan.  No orders of the defined types were placed in this encounter.   No orders of the defined types were placed in this encounter.

## 2016-11-06 NOTE — Patient Instructions (Signed)
The next time you have labs drawn at the cancer center, ask them to release and draw your lipid panel and SGPT (if not having other liver tests already done) Try to limit saturated fats in your diet (bologna, hot dogs, barbeque, cheeseburgers, hamburgers, steak, bacon, sausage, cheese, etc.) and get more fresh fruits, vegetables, and whole grains

## 2016-11-06 NOTE — Progress Notes (Signed)
Patient ID: Maria Patterson, female   DOB: 02-Jan-1941, 76 y.o.   MRN: [id number]  Chief Complaint  Patient presents with  . New Evaluation    Hyperlipidema, PAD    HPI Maria Patterson is a 76 y.o. female.  I am asked to see the patient by Dr. Sanda Klein for evaluation of PAD.  The patient reports pain in her legs that is made worse with activity.  She denies ulceration or infection.  The legs cramp and burn with short distances of walking.  There is intermittent swelling as an associated symptoms.  She does not recall a clear inciting event or causative factor.  The symptoms have been gradual in their onset and worsening over several months.  Both legs are affected.  Apparently had a home ABI done by a home health nurse suggesting her left ABI was 0.23 and her right ABI was 0.61 about two months ago.     Past Medical History:  Diagnosis Date  . Breast mass, left 09/22/2016   RECOMMENDATION: Ultrasound-guided core biopsies of masses at left breast 2 o'clock 2 cm from nipple, left breast 2 o'clock 5 cm from nipple, abnormal left axillary lymph nodes.  Maria Patterson Kitchen GERD (gastroesophageal reflux disease)   . Hyperglycemia   . Hyperlipidemia   . Obesity   . Postmenopausal     Past Surgical History:  Procedure Laterality Date  . ABDOMINAL HYSTERECTOMY    . BREAST BIOPSY Left 09/30/2016   US biopsy of 3 areas, awaiting pathology  . ESOPHAGOGASTRODUODENOSCOPY (EGD) WITH PROPOFOL N/A 12/24/2014   Procedure: ESOPHAGOGASTRODUODENOSCOPY (EGD) WITH PROPOFOL;  Surgeon: Manya Silvas, MD;  Location: [facility];  Service: Endoscopy;  Laterality: N/A;  . PORTACATH PLACEMENT Right 10/27/2016   Procedure: INSERTION PORT-A-CATH;  Surgeon: Nestor Lewandowsky, MD;  Location: [facility] ORS;  Service: General;  Laterality: Right;  . TONSILECTOMY, ADENOIDECTOMY, BILATERAL MYRINGOTOMY AND TUBES      Family History  Problem Relation Age of Onset  . Stroke Mother   . Stroke Father   . Cancer Neg Hx   . Depression Neg Hx   No  bleeding or clotting disorders  Social History Social History  Substance Use Topics  . Smoking status: Never Smoker  . Smokeless tobacco: Never Used  . Alcohol use No  No IVDU  Allergies  Allergen Reactions  . Latex Itching  . Penicillins Itching and Rash    Has patient had a PCN reaction causing immediate rash, facial/tongue/throat swelling, SOB or lightheadedness with hypotension: Yes Has patient had a PCN reaction causing severe rash involving mucus membranes or skin necrosis: No Has patient had a PCN reaction that required hospitalization No Has patient had a PCN reaction occurring within the last 10 years: No If all of the above answers are "NO", then may proceed with Cephalosporin use.     Current Outpatient Prescriptions  Medication Sig Dispense Refill  . acetaminophen (TYLENOL) 500 MG tablet Take 1,000 mg by mouth every 6 (six) hours as needed for mild pain.    Maria Patterson Kitchen aspirin 81 MG tablet Take 81 mg by mouth daily.    Maria Patterson Kitchen atorvastatin (LIPITOR) 40 MG tablet Take 1 tablet (40 mg total) by mouth at bedtime. (Patient taking differently: Take 40 mg by mouth 3 (three) times a week. In the evening  Depends on patient's preference if takes 20-40 mg) 30 tablet 1  . Cholecalciferol (VITAMIN D) 2000 units tablet Take 2,000 Units by mouth daily. One by mouth twice a week    .  clindamycin (CLEOCIN) 300 MG capsule Take 1 capsule (300 mg total) by mouth 3 (three) times daily. 30 capsule 0  . ferrous sulfate 325 (65 FE) MG EC tablet Take 325 mg by mouth every other day.     . fluticasone (FLONASE) 50 MCG/ACT nasal spray Place 1 spray into both nostrils daily as needed for allergies or rhinitis.    Maria Patterson Kitchen lidocaine-prilocaine (EMLA) cream Apply to affected area once 30 g 3  . Multiple Vitamin (MULTIVITAMIN WITH MINERALS) TABS tablet Take 1 tablet by mouth every other day.     Maria Patterson Kitchen omeprazole (PRILOSEC) 20 MG capsule Take 20 mg by mouth daily as needed (heartburn).     . ondansetron (ZOFRAN) 8 MG tablet  Take 1 tablet (8 mg total) by mouth 2 (two) times daily as needed for refractory nausea / vomiting. 60 tablet 2  . prochlorperazine (COMPAZINE) 10 MG tablet Take 1 tablet (10 mg total) by mouth every 6 (six) hours as needed (Nausea or vomiting). (Patient not taking: Reported on 11/06/2016) 60 tablet 2   No current facility-administered medications for this visit.       REVIEW OF SYSTEMS (Negative unless checked)  Constitutional: [] Weight loss  [] Fever  [] Chills Cardiac: [] Chest pain   [] Chest pressure   [] Palpitations   [] Shortness of breath when laying flat   [] Shortness of breath at rest   [] Shortness of breath with exertion. Vascular:  [x] Pain in legs with walking   [] Pain in legs at rest   [] Pain in legs when laying flat   [x] Claudication   [x] Pain in feet when walking  [] Pain in feet at rest  [] Pain in feet when laying flat   [] History of DVT   [] Phlebitis   [] Swelling in legs   [] Varicose veins   [] Non-healing ulcers Pulmonary:   [] Uses home oxygen   [] Productive cough   [] Hemoptysis   [] Wheeze  [] COPD   [] Asthma Neurologic:  [] Dizziness  [] Blackouts   [] Seizures   [] History of stroke   [] History of TIA  [] Aphasia   [] Temporary blindness   [] Dysphagia   [] Weakness or numbness in arms   [] Weakness or numbness in legs Musculoskeletal:  [x] Arthritis   [] Joint swelling   [] Joint pain   [] Low back pain Hematologic:  [] Easy bruising  [] Easy bleeding   [] Hypercoagulable state   [] Anemic  [] Hepatitis Gastrointestinal:  [] Blood in stool   [] Vomiting blood  [] Gastroesophageal reflux/heartburn   [] Abdominal pain Genitourinary:  [] Chronic kidney disease   [] Difficult urination  [] Frequent urination  [] Burning with urination   [] Hematuria Skin:  [] Rashes   [] Ulcers   [] Wounds Psychological:  [] History of anxiety   []  History of major depression.    Physical Exam BP (!) 153/78 (BP Location: Right Arm)   Pulse 92   Resp 16   Ht 5\' 1"  (1.549 m)   Wt 168 lb (76.2 kg)   BMI 31.74 kg/m  Gen:   WD/WN, NAD Head: [place]/AT, No temporalis wasting. Prominent temp pulse not noted. Ear/Nose/Throat: Hearing grossly intact, nares w/o erythema or drainage, oropharynx w/o Erythema/Exudate Eyes: Conjunctiva clear, sclera non-icteric  Neck: trachea midline.  No JVD.  Pulmonary:  Good air movement, no use of accessory muscles, respirations not labored Cardiac: RRR Vascular:  Vessel Right Left  Radial Palpable Palpable  Ulnar Palpable Palpable  Brachial Palpable Palpable  Carotid Palpable, without bruit Palpable, without bruit  Aorta Not palpable N/A  Femoral Palpable Palpable  Popliteal Not Palpable Not Palpable  PT 1+ Palpable Not Palpable  DP 1+  Palpable 1+ Palpable   Gastrointestinal: soft, non-tender/non-distended.  Musculoskeletal: M/S 5/5 throughout.  No deformity or atrophy.  Neurologic: Sensation grossly intact in extremities.  Symmetrical.  Speech is fluent. Motor exam as listed above. Psychiatric: Judgment intact, Mood & affect appropriate for pt's clinical situation. Dermatologic: No rashes or ulcers noted.  No cellulitis or open wounds.    Radiology Dg Chest 2 View  Result Date: 10/23/2016 CLINICAL DATA:  Left breast mass EXAM: CHEST  2 VIEW COMPARISON:  10/20/2016 and 12/11/2011 FINDINGS: Cardiomediastinal silhouette is stable. No infiltrate or pleural effusion. No pulmonary edema. Mild elevation of the right hemidiaphragm again noted. Bony thorax is unremarkable. IMPRESSION: No active cardiopulmonary disease. Electronically Signed   By: Lahoma Crocker M.D.   On: 10/23/2016 14:01   Nm Cardiac Muga Rest  Result Date: 10/16/2016 CLINICAL DATA:  LEFT breast cancer, pretreatment assessment of cardiac function EXAM: NUCLEAR MEDICINE CARDIAC BLOOD POOL IMAGING (MUGA) TECHNIQUE: Cardiac multi-gated acquisition was performed at rest following intravenous injection of Tc-39m labeled red blood cells. RADIOPHARMACEUTICALS:  20.72 mCi Tc-36m pertechnetate in-vitro labeled autologous red blood  cells IV COMPARISON:  None FINDINGS: LEFT ventricular ejection fraction is calculated at 65%, within the normal range. Study was obtained at a heart rate of 60 beats per minute. Cine analysis of the gated blood pool in 3 projections demonstrates normal LEFT ventricular wall motion. IMPRESSION: Normal LEFT ventricular ejection fraction 65%. Normal LV wall motion. Electronically Signed   By: Lavonia Dana M.D.   On: 10/16/2016 13:42   Nm Pet Image Initial (pi) Skull Base To Thigh  Result Date: 10/20/2016 CLINICAL DATA:  Initial treatment strategy for left upper outer quadrant breast carcinoma. EXAM: NUCLEAR MEDICINE PET SKULL BASE TO THIGH TECHNIQUE: 12.2 mCi F-18 FDG was injected intravenously. Full-ring PET imaging was performed from the skull base to thigh after the radiotracer. CT data was obtained and used for attenuation correction and anatomic localization. FASTING BLOOD GLUCOSE:  Value: 83 mg/dl COMPARISON:  AP CT on 03/01/2012 FINDINGS: NECK No hypermetabolic lymph nodes in the neck. CHEST 2.9 cm mass in the upper outer quadrant left breast is hypermetabolic, with SUV max of 22.2. This is consistent with known primary breast carcinoma. Hypermetabolic left axillary and subpectoral lymphadenopathy is seen. These measure less than 1 cm but have SUV max of 16.5, consistent with metastatic lymphadenopathy. No hypermetabolic mediastinal or hilar nodes. No suspicious pulmonary nodules on the CT scan. ABDOMEN/PELVIS No abnormal hypermetabolic activity within the liver, pancreas, adrenal glands, or spleen. Stable small benign right renal cysts. Shotty mesenteric lymph nodes are seen, largest measuring 8 mm on image 153/4 with SUV max of 4.7. No pathologically enlarged lymph nodes identified. Prior hysterectomy noted. Adnexal regions are unremarkable in appearance. Aortic atherosclerosis. SKELETON No focal hypermetabolic activity to suggest skeletal metastasis. IMPRESSION: Hypermetabolic 3 cm mass in upper outer  quadrant left breast, consistent with known primary breast carcinoma. Hypermetabolic left axillary and subpectoral lymphadenopathy, consistent with metastatic disease. Shotty less than 1 cm mesenteric lymph nodes with FDG uptake. Differential diagnosis includes inflammatory etiologies and small lymph node metastases. Recommend continued attention on follow-up CT. Aortic atherosclerosis. Electronically Signed   By: Earle Gell M.D.   On: 10/20/2016 13:23   Dg Chest Port 1 View  Result Date: 10/27/2016 CLINICAL DATA:  Status post Port-A-Cath insertion. EXAM: PORTABLE CHEST 1 VIEW COMPARISON:  PA and lateral chest 10/23/2016. FINDINGS: Right IJ approach Port-A-Cath is in place with the tip projecting in the mid to lower superior vena cava.  No pneumothorax. Lung volumes are low with some basilar atelectasis. No pleural effusion. Heart size is upper normal. Aortic atherosclerosis is noted. IMPRESSION: Port-A-Cath tip projects in the mid to lower superior vena cava. Negative for pneumothorax or other acute abnormality. Electronically Signed   By: Inge Rise M.D.   On: 10/27/2016 14:58   Dg C-arm 1-60 Min-no Report  Result Date: 10/27/2016 Fluoroscopy was utilized by the requesting physician.  No radiographic interpretation.    Labs   Assessment/Plan:  Hyperlipidemia LDL goal <100 lipid control important in reducing the progression of atherosclerotic disease. Continue statin therapy   Malignant neoplasm of upper-outer quadrant of left female breast [facility]) Recent resection and ongoing treatment.  Essential hypertension, benign blood pressure control important in reducing the progression of atherosclerotic disease. On appropriate oral medications.   Atherosclerosis of native arteries of extremity with intermittent claudication (HCC) Recommend:  Apparently had a home ABI done by a home health nurse suggesting her left ABI was 0.23 and her right ABI was 0.61 about two months ago. Patient  should undergo arterial duplex of the lower extremity ASAP because there has been a significant deterioration in the patient's lower extremity symptoms.  The patient states they are having increased pain and a marked decrease in the distance that they can walk.  The risks and benefits as well as the alternatives were discussed in detail with the patient.  All questions were answered.  Patient agrees to proceed and understands this could be a prelude to angiography and intervention.  The patient will follow up with me in the office to review the studies.       Leotis Pain 11/09/2016, 8:33 AM   This note was created with Dragon medical transcription system.  Any errors from dictation are unintentional.

## 2016-11-07 DIAGNOSIS — J309 Allergic rhinitis, unspecified: Secondary | ICD-10-CM | POA: Insufficient documentation

## 2016-11-07 DIAGNOSIS — R748 Abnormal levels of other serum enzymes: Secondary | ICD-10-CM | POA: Insufficient documentation

## 2016-11-07 DIAGNOSIS — I10 Essential (primary) hypertension: Secondary | ICD-10-CM | POA: Insufficient documentation

## 2016-11-07 NOTE — Assessment & Plan Note (Signed)
Nasal corticosteroid; avoid decongestants

## 2016-11-07 NOTE — Assessment & Plan Note (Signed)
Monitored by cancer center doctor

## 2016-11-07 NOTE — Assessment & Plan Note (Signed)
Under the care of oncologist; support offered

## 2016-11-07 NOTE — Assessment & Plan Note (Signed)
She is battling breast cancer, and having multiple labs drawn; I will not draw labs today, but asked if she could to have her lipid panel drawn at one of her upcoming blood draws through the cancer center; orders in system; try to limit saturated fats

## 2016-11-07 NOTE — Assessment & Plan Note (Signed)
Well controlled 

## 2016-11-07 NOTE — Assessment & Plan Note (Signed)
Glucose level was 100; monitor periodically

## 2016-11-09 DIAGNOSIS — I70219 Atherosclerosis of native arteries of extremities with intermittent claudication, unspecified extremity: Secondary | ICD-10-CM | POA: Insufficient documentation

## 2016-11-09 NOTE — Assessment & Plan Note (Signed)
Recommend:  Apparently had a home ABI done by a home health nurse suggesting her left ABI was 0.23 and her right ABI was 0.61 about two months ago. Patient should undergo arterial duplex of the lower extremity ASAP because there has been a significant deterioration in the patient's lower extremity symptoms.  The patient states they are having increased pain and a marked decrease in the distance that they can walk.  The risks and benefits as well as the alternatives were discussed in detail with the patient.  All questions were answered.  Patient agrees to proceed and understands this could be a prelude to angiography and intervention.  The patient will follow up with me in the office to review the studies.

## 2016-11-09 NOTE — Assessment & Plan Note (Signed)
blood pressure control important in reducing the progression of atherosclerotic disease. On appropriate oral medications.  

## 2016-11-09 NOTE — Assessment & Plan Note (Signed)
Recent resection and ongoing treatment.

## 2016-11-09 NOTE — Assessment & Plan Note (Signed)
lipid control important in reducing the progression of atherosclerotic disease. Continue statin therapy  

## 2016-11-09 NOTE — Patient Instructions (Signed)
Peripheral Vascular Disease Peripheral vascular disease (PVD) is a disease of the blood vessels that are not part of your heart and brain. A simple term for PVD is poor circulation. In most cases, PVD narrows the blood vessels that carry blood from your heart to the rest of your body. This can result in a decreased supply of blood to your arms, legs, and internal organs, like your stomach or kidneys. However, it most often affects a person's lower legs and feet. There are two types of PVD.  Organic PVD. This is the more common type. It is caused by damage to the structure of blood vessels.  Functional PVD. This is caused by conditions that make blood vessels contract and tighten (spasm).  Without treatment, PVD tends to get worse over time. PVD can also lead to acute ischemic limb. This is when an arm or limb suddenly has trouble getting enough blood. This is a medical emergency. What are the causes? Each type of PVD has many different causes. The most common cause of PVD is buildup of a fatty material (plaque) inside of your arteries (atherosclerosis). Small amounts of plaque can break off from the walls of the blood vessels and become lodged in a smaller artery. This blocks blood flow and can cause acute ischemic limb. Other common causes of PVD include:  Blood clots that form inside of blood vessels.  Injuries to blood vessels.  Diseases that cause inflammation of blood vessels or cause blood vessel spasms.  Health behaviors and health history that increase your risk of developing PVD.  What increases the risk? You may have a greater risk of PVD if you:  Have a family history of PVD.  Have certain medical conditions, including: ? High cholesterol. ? Diabetes. ? High blood pressure (hypertension). ? Coronary heart disease. ? Past problems with blood clots. ? Past injury, such as burns or a broken bone. These may have damaged blood vessels in your limbs. ? Buerger disease. This is  caused by inflamed blood vessels in your hands and feet. ? Some forms of arthritis. ? Rare birth defects that affect the arteries in your legs.  Use tobacco.  Do not get enough exercise.  Are obese.  Are age 50 or older.  What are the signs or symptoms? PVD may cause many different symptoms. Your symptoms depend on what part of your body is not getting enough blood. Some common signs and symptoms include:  Cramps in your lower legs. This may be a symptom of poor leg circulation (claudication).  Pain and weakness in your legs while you are physically active that goes away when you rest (intermittent claudication).  Leg pain when at rest.  Leg numbness, tingling, or weakness.  Coldness in a leg or foot, especially when compared with the other leg.  Skin or hair changes. These can include: ? Hair loss. ? Shiny skin. ? Pale or bluish skin. ? Thick toenails.  Inability to get or maintain an erection (erectile dysfunction).  People with PVD are more prone to developing ulcers and sores on their toes, feet, or legs. These may take longer than normal to heal. How is this diagnosed? Your health care provider may diagnose PVD from your signs and symptoms. The health care provider will also do a physical exam. You may have tests to find out what is causing your PVD and determine its severity. Tests may include:  Blood pressure recordings from your arms and legs and measurements of the strength of your pulses (  pulse volume recordings).  Imaging studies using sound waves to take pictures of the blood flow through your blood vessels (Doppler ultrasound).  Injecting a dye into your blood vessels before having imaging studies using: ? X-rays (angiogram or arteriogram). ? Computer-generated X-rays (CT angiogram). ? A powerful electromagnetic field and a computer (magnetic resonance angiogram or MRA).  How is this treated? Treatment for PVD depends on the cause of your condition and the  severity of your symptoms. It also depends on your age. Underlying causes need to be treated and controlled. These include long-lasting (chronic) conditions, such as diabetes, high cholesterol, and high blood pressure. You may need to first try making lifestyle changes and taking medicines. Surgery may be needed if these do not work. Lifestyle changes may include:  Quitting smoking.  Exercising regularly.  Following a low-fat, low-cholesterol diet.  Medicines may include:  Blood thinners to prevent blood clots.  Medicines to improve blood flow.  Medicines to improve your blood cholesterol levels.  Surgical procedures may include:  A procedure that uses an inflated balloon to open a blocked artery and improve blood flow (angioplasty).  A procedure to put in a tube (stent) to keep a blocked artery open (stent implant).  Surgery to reroute blood flow around a blocked artery (peripheral bypass surgery).  Surgery to remove dead tissue from an infected wound on the affected limb.  Amputation. This is surgical removal of the affected limb. This may be necessary in cases of acute ischemic limb that are not improved through medical or surgical treatments.  Follow these instructions at home:  Take medicines only as directed by your health care provider.  Do not use any tobacco products, including cigarettes, chewing tobacco, or electronic cigarettes. If you need help quitting, ask your health care provider.  Lose weight if you are overweight, and maintain a healthy weight as directed by your health care provider.  Eat a diet that is low in fat and cholesterol. If you need help, ask your health care provider.  Exercise regularly. Ask your health care provider to suggest some good activities for you.  Use compression stockings or other mechanical devices as directed by your health care provider.  Take good care of your feet. ? Wear comfortable shoes that fit well. ? Check your feet  often for any cuts or sores. Contact a health care provider if:  You have cramps in your legs while walking.  You have leg pain when you are at rest.  You have coldness in a leg or foot.  Your skin changes.  You have erectile dysfunction.  You have cuts or sores on your feet that are not healing. Get help right away if:  Your arm or leg turns cold and blue.  Your arms or legs become red, warm, swollen, painful, or numb.  You have chest pain or trouble breathing.  You suddenly have weakness in your face, arm, or leg.  You become very confused or lose the ability to speak.  You suddenly have a very bad headache or lose your vision. This information is not intended to replace advice given to you by your health care provider. Make sure you discuss any questions you have with your health care provider. Document Released: 08/13/2004 Document Revised: 12/12/2015 Document Reviewed: 12/14/2013 Elsevier Interactive Patient Education  2017 [employer]  

## 2016-11-13 ENCOUNTER — Ambulatory Visit (INDEPENDENT_AMBULATORY_CARE_PROVIDER_SITE_OTHER): Payer: Medicare Other | Admitting: Cardiothoracic Surgery

## 2016-11-13 ENCOUNTER — Encounter: Payer: Self-pay | Admitting: Cardiothoracic Surgery

## 2016-11-13 VITALS — BP 120/73 | HR 88 | Temp 97.8°F | Resp 16 | Ht 61.0 in | Wt 169.0 lb

## 2016-11-13 DIAGNOSIS — N6320 Unspecified lump in the left breast, unspecified quadrant: Secondary | ICD-10-CM

## 2016-11-13 NOTE — Patient Instructions (Signed)
Please give us a call in case you have any questions or concerns.  

## 2016-11-16 NOTE — Progress Notes (Signed)
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  INTERVAL HISTORY: Patient returns to clinic today for further evaluation and consideration of cycle 2 of neoadjuvant Taxotere, carboplatin, Herceptin, and Perjeta. She currently feels well and is asymptomatic. She continues to be anxious. She has no neurologic complaints. She denies any recent fevers or illnesses. She has a good appetite and denies weight loss. She has no chest pain or shortness of breath. She denies any nausea, vomiting, constipation, or diarrhea. She has no urinary complaints. Patient offers no further specific complaints today.  REVIEW OF SYSTEMS:   Review of Systems  Constitutional: Negative.  Negative for fever, malaise/fatigue and weight loss.  Respiratory: Negative.  Negative for cough.   Cardiovascular: Negative.  Negative for chest pain and leg swelling.  Gastrointestinal: Negative.  Negative for abdominal pain.  Genitourinary: Negative.   Musculoskeletal: Negative.   Neurological: Negative.  Negative for weakness.  Psychiatric/Behavioral: The patient is nervous/anxious.     As per HPI. Otherwise, a complete review of systems is negative.  PAST MEDICAL HISTORY: Past Medical History:  Diagnosis Date  . Breast cancer ([place])   . Breast mass, left 09/22/2016   RECOMMENDATION: Ultrasound-guided core biopsies of masses at left breast 2 o'clock 2 cm from nipple, left breast 2 o'clock 5 cm from nipple, abnormal left axillary lymph nodes.  Marland Kitchen GERD (gastroesophageal reflux disease)   . Hyperglycemia   . Hyperlipidemia   . Obesity   . Postmenopausal     PAST SURGICAL HISTORY: Past Surgical History:  Procedure  Laterality Date  . ABDOMINAL HYSTERECTOMY    . BREAST BIOPSY Left 09/30/2016   US biopsy of 3 areas, awaiting pathology  . ESOPHAGOGASTRODUODENOSCOPY (EGD) WITH PROPOFOL N/A 12/24/2014   Procedure: ESOPHAGOGASTRODUODENOSCOPY (EGD) WITH PROPOFOL;  Surgeon: Manya Silvas, MD;  Location: [facility] ENDOSCOPY;  Service: Endoscopy;  Laterality: N/A;  . PORTACATH PLACEMENT Right 10/27/2016   Procedure: INSERTION PORT-A-CATH;  Surgeon: Nestor Lewandowsky, MD;  Location: [facility] ORS;  Service: General;  Laterality: Right;  . TONSILECTOMY, ADENOIDECTOMY, BILATERAL MYRINGOTOMY AND TUBES      FAMILY HISTORY: Family History  Problem Relation Age of Onset  . Stroke Mother   . Stroke Father   . Cancer Neg Hx   . Depression Neg Hx     ADVANCED DIRECTIVES (Y/N):  N  HEALTH MAINTENANCE: Social History  Substance Use Topics  . Smoking status: Never Smoker  . Smokeless tobacco: Never Used  . Alcohol use No     Colonoscopy:  PAP:  Bone density:  Lipid panel:  Allergies  Allergen Reactions  . Latex Itching  . Penicillins Itching and Rash    Has patient had a PCN reaction causing immediate rash, facial/tongue/throat swelling, SOB or lightheadedness with hypotension: Yes Has patient had a PCN reaction causing severe rash involving mucus membranes or skin necrosis: No Has patient had a PCN reaction that required hospitalization No Has patient had a PCN reaction occurring within the last 10 years: No If all of the above answers are "NO", then may proceed with Cephalosporin use.     Current Outpatient Prescriptions  Medication Sig Dispense Refill  . acetaminophen (TYLENOL) 500 MG tablet Take 1,000 mg by mouth every 6 (six) hours as needed  for mild pain.    Marland Kitchen aspirin 81 MG tablet Take 81 mg by mouth daily.    Marland Kitchen atorvastatin (LIPITOR) 40 MG tablet Take 1 tablet (40 mg total) by mouth at bedtime. (Patient taking differently: Take 40 mg by mouth 3 (three) times a week. In the evening  Depends on patient's  preference if takes 20-40 mg) 30 tablet 1  . Cholecalciferol (VITAMIN D) 2000 units tablet Take 2,000 Units by mouth daily. One by mouth twice a week    . clindamycin (CLEOCIN) 300 MG capsule Take 1 capsule (300 mg total) by mouth 3 (three) times daily. 30 capsule 0  . ferrous sulfate 325 (65 FE) MG EC tablet Take 325 mg by mouth every other day.     . fluticasone (FLONASE) 50 MCG/ACT nasal spray Place 1 spray into both nostrils daily as needed for allergies or rhinitis.    Marland Kitchen lidocaine-prilocaine (EMLA) cream Apply to affected area once 30 g 3  . Multiple Vitamin (MULTIVITAMIN WITH MINERALS) TABS tablet Take 1 tablet by mouth every other day.     Marland Kitchen omeprazole (PRILOSEC) 20 MG capsule Take 20 mg by mouth daily as needed (heartburn).     . ondansetron (ZOFRAN) 8 MG tablet Take 1 tablet (8 mg total) by mouth 2 (two) times daily as needed for refractory nausea / vomiting. 60 tablet 2  . prochlorperazine (COMPAZINE) 10 MG tablet Take 1 tablet (10 mg total) by mouth every 6 (six) hours as needed (Nausea or vomiting). 60 tablet 2   No current facility-administered medications for this visit.    Facility-Administered Medications Ordered in Other Visits  Medication Dose Route Frequency Provider Last Rate Last Dose  . sodium chloride flush (NS) 0.9 % injection 10 mL  10 mL Intracatheter PRN Cammie Sickle, MD   10 mL at 11/18/16 0932    OBJECTIVE: Vitals:   11/17/16 1003  BP: (!) 150/84  Pulse: (!) 106  Temp: 98.5 F (36.9 C)     Body mass index is 32.33 kg/m.    ECOG FS:0 - Asymptomatic  General: Well-developed, well-nourished, no acute distress. Eyes: Pink conjunctiva, anicteric sclera. Breasts: Easily palpable left breast mass. Exam deferred today. Chest wall: Port site without erythema or induration. Lungs: Clear to auscultation bilaterally. Heart: Regular rate and rhythm. No rubs, murmurs, or gallops. Abdomen: Soft, nontender, nondistended. No organomegaly noted, normoactive bowel  sounds. Musculoskeletal: No edema, cyanosis, or clubbing. Neuro: Alert, answering all questions appropriately. Cranial nerves grossly intact. Skin: No rashes or petechiae noted. Psych: Normal affect.  LAB RESULTS:  Lab Results  Component Value Date   NA 142 11/17/2016   K 3.4 (L) 11/17/2016   CL 117 (H) 11/17/2016   CO2 20 (L) 11/17/2016   GLUCOSE 88 11/17/2016   BUN 11 11/17/2016   CREATININE 0.88 11/17/2016   CALCIUM 9.0 11/17/2016   PROT 6.8 11/17/2016   ALBUMIN 3.5 11/17/2016   AST 21 11/17/2016   ALT 20 11/17/2016   ALKPHOS 108 11/17/2016   BILITOT 0.5 11/17/2016   GFRNONAA >60 11/17/2016   GFRAA >60 11/17/2016    Lab Results  Component Value Date   WBC 7.1 11/17/2016   NEUTROABS 3.9 11/17/2016   HGB 10.7 (L) 11/17/2016   HCT 31.0 (L) 11/17/2016   MCV 87.3 11/17/2016   PLT 257 11/17/2016     STUDIES: Dg Chest 2 View  Result Date: 10/23/2016 CLINICAL DATA:  Left breast mass EXAM: CHEST  2 VIEW COMPARISON:  10/20/2016 and 12/11/2011 FINDINGS:  Cardiomediastinal silhouette is stable. No infiltrate or pleural effusion. No pulmonary edema. Mild elevation of the right hemidiaphragm again noted. Bony thorax is unremarkable. IMPRESSION: No active cardiopulmonary disease. Electronically Signed   By: Lahoma Crocker M.D.   On: 10/23/2016 14:01   Nm Pet Image Initial (pi) Skull Base To Thigh  Result Date: 10/20/2016 CLINICAL DATA:  Initial treatment strategy for left upper outer quadrant breast carcinoma. EXAM: NUCLEAR MEDICINE PET SKULL BASE TO THIGH TECHNIQUE: 12.2 mCi F-18 FDG was injected intravenously. Full-ring PET imaging was performed from the skull base to thigh after the radiotracer. CT data was obtained and used for attenuation correction and anatomic localization. FASTING BLOOD GLUCOSE:  Value: 83 mg/dl COMPARISON:  AP CT on 03/01/2012 FINDINGS: NECK No hypermetabolic lymph nodes in the neck. CHEST 2.9 cm mass in the upper outer quadrant left breast is hypermetabolic, with  SUV max of 22.2. This is consistent with known primary breast carcinoma. Hypermetabolic left axillary and subpectoral lymphadenopathy is seen. These measure less than 1 cm but have SUV max of 16.5, consistent with metastatic lymphadenopathy. No hypermetabolic mediastinal or hilar nodes. No suspicious pulmonary nodules on the CT scan. ABDOMEN/PELVIS No abnormal hypermetabolic activity within the liver, pancreas, adrenal glands, or spleen. Stable small benign right renal cysts. Shotty mesenteric lymph nodes are seen, largest measuring 8 mm on image 153/4 with SUV max of 4.7. No pathologically enlarged lymph nodes identified. Prior hysterectomy noted. Adnexal regions are unremarkable in appearance. Aortic atherosclerosis. SKELETON No focal hypermetabolic activity to suggest skeletal metastasis. IMPRESSION: Hypermetabolic 3 cm mass in upper outer quadrant left breast, consistent with known primary breast carcinoma. Hypermetabolic left axillary and subpectoral lymphadenopathy, consistent with metastatic disease. Shotty less than 1 cm mesenteric lymph nodes with FDG uptake. Differential diagnosis includes inflammatory etiologies and small lymph node metastases. Recommend continued attention on follow-up CT. Aortic atherosclerosis. Electronically Signed   By: Earle Gell M.D.   On: 10/20/2016 13:23   Dg Chest Port 1 View  Result Date: 10/27/2016 CLINICAL DATA:  Status post Port-A-Cath insertion. EXAM: PORTABLE CHEST 1 VIEW COMPARISON:  PA and lateral chest 10/23/2016. FINDINGS: Right IJ approach Port-A-Cath is in place with the tip projecting in the mid to lower superior vena cava. No pneumothorax. Lung volumes are low with some basilar atelectasis. No pleural effusion. Heart size is upper normal. Aortic atherosclerosis is noted. IMPRESSION: Port-A-Cath tip projects in the mid to lower superior vena cava. Negative for pneumothorax or other acute abnormality. Electronically Signed   By: Inge Rise M.D.   On:  10/27/2016 14:58   Dg C-arm 1-60 Min-no Report  Result Date: 10/27/2016 Fluoroscopy was utilized by the requesting physician.  No radiographic interpretation.    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  PLAN:    1. 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 Patient will then require surgery followed by adjuvant XRT. Patient will receive Herceptin only every 3 weeks for an entire year after her surgery. An aromatase inhibitor would not offer benefit given the ER/PR negativity of her disease. Pretreatment MUGA scan on October 16, 2016 adequate to proceed with an EF of 65%. Repeat MUGA scan in June 2018. PET scan results reviewed independently and reported as above with no obvious metastatic disease other than her known  disease in breast and axilla. Proceed with cycle 2 of 6 of neoadjuvant treatment. Patient also receives Neulasta support.  Return to clinic in 3 weeks for further evaluation and consideration of cycle 3. 2. Port cellulitis: Resolved. 3. Leukocytosis: Secondary to Neulasta. Monitor.  4. Renal insufficiency: Creatinine is within normal limits today. 5. Hypertension: Blood pressure mildly elevated, monitor.  Patient expressed understanding and was in agreement with this plan. She also understands that She can call clinic at any time with any questions, concerns, or complaints.   Cancer Staging Malignant neoplasm of upper-outer quadrant of left female breast [facility]) Staging form: Breast, AJCC 8th Edition - Clinical stage from 10/11/2016: Stage IIB (cT2, cN1, cM0, G3, ER: Negative, PR: Negative, HER2: Positive) - Signed by Lloyd Huger, MD on 10/11/2016   Lloyd Huger, MD   11/18/2016 2:22  PM

## 2016-11-17 ENCOUNTER — Inpatient Hospital Stay: Payer: Medicare Other | Attending: Oncology

## 2016-11-17 ENCOUNTER — Encounter: Payer: Self-pay | Admitting: Oncology

## 2016-11-17 ENCOUNTER — Inpatient Hospital Stay (HOSPITAL_BASED_OUTPATIENT_CLINIC_OR_DEPARTMENT_OTHER): Payer: Medicare Other | Admitting: Oncology

## 2016-11-17 VITALS — BP 150/84 | HR 106 | Temp 98.5°F | Wt 171.1 lb

## 2016-11-17 DIAGNOSIS — Z7982 Long term (current) use of aspirin: Secondary | ICD-10-CM | POA: Diagnosis not present

## 2016-11-17 DIAGNOSIS — R739 Hyperglycemia, unspecified: Secondary | ICD-10-CM | POA: Insufficient documentation

## 2016-11-17 DIAGNOSIS — Z79899 Other long term (current) drug therapy: Secondary | ICD-10-CM

## 2016-11-17 DIAGNOSIS — Z5112 Encounter for antineoplastic immunotherapy: Secondary | ICD-10-CM | POA: Diagnosis not present

## 2016-11-17 DIAGNOSIS — I10 Essential (primary) hypertension: Secondary | ICD-10-CM

## 2016-11-17 DIAGNOSIS — Z5111 Encounter for antineoplastic chemotherapy: Secondary | ICD-10-CM | POA: Insufficient documentation

## 2016-11-17 DIAGNOSIS — E785 Hyperlipidemia, unspecified: Secondary | ICD-10-CM

## 2016-11-17 DIAGNOSIS — N281 Cyst of kidney, acquired: Secondary | ICD-10-CM

## 2016-11-17 DIAGNOSIS — F419 Anxiety disorder, unspecified: Secondary | ICD-10-CM | POA: Diagnosis not present

## 2016-11-17 DIAGNOSIS — K219 Gastro-esophageal reflux disease without esophagitis: Secondary | ICD-10-CM

## 2016-11-17 DIAGNOSIS — E669 Obesity, unspecified: Secondary | ICD-10-CM | POA: Diagnosis not present

## 2016-11-17 DIAGNOSIS — I700 Atherosclerosis of aorta: Secondary | ICD-10-CM | POA: Insufficient documentation

## 2016-11-17 DIAGNOSIS — Z171 Estrogen receptor negative status [ER-]: Secondary | ICD-10-CM

## 2016-11-17 DIAGNOSIS — D72829 Elevated white blood cell count, unspecified: Secondary | ICD-10-CM | POA: Insufficient documentation

## 2016-11-17 DIAGNOSIS — Z7689 Persons encountering health services in other specified circumstances: Secondary | ICD-10-CM | POA: Insufficient documentation

## 2016-11-17 DIAGNOSIS — C50412 Malignant neoplasm of upper-outer quadrant of left female breast: Secondary | ICD-10-CM

## 2016-11-17 DIAGNOSIS — D6481 Anemia due to antineoplastic chemotherapy: Secondary | ICD-10-CM | POA: Insufficient documentation

## 2016-11-17 LAB — CBC WITH DIFFERENTIAL/PLATELET
BASOS PCT: 1 %
Basophils Absolute: 0.1 10*3/uL (ref 0–0.1)
Eosinophils Absolute: 0.3 10*3/uL (ref 0–0.7)
Eosinophils Relative: 4 %
HEMATOCRIT: 31 % — AB (ref 35.0–47.0)
HEMOGLOBIN: 10.7 g/dL — AB (ref 12.0–16.0)
LYMPHS ABS: 2.3 10*3/uL (ref 1.0–3.6)
LYMPHS PCT: 32 %
MCH: 30.2 pg (ref 26.0–34.0)
MCHC: 34.6 g/dL (ref 32.0–36.0)
MCV: 87.3 fL (ref 80.0–100.0)
MONOS PCT: 9 %
Monocytes Absolute: 0.6 10*3/uL (ref 0.2–0.9)
NEUTROS ABS: 3.9 10*3/uL (ref 1.4–6.5)
NEUTROS PCT: 54 %
Platelets: 257 10*3/uL (ref 150–440)
RBC: 3.55 MIL/uL — ABNORMAL LOW (ref 3.80–5.20)
RDW: 15.4 % — ABNORMAL HIGH (ref 11.5–14.5)
WBC: 7.1 10*3/uL (ref 3.6–11.0)

## 2016-11-17 LAB — COMPREHENSIVE METABOLIC PANEL
ALBUMIN: 3.5 g/dL (ref 3.5–5.0)
ALK PHOS: 108 U/L (ref 38–126)
ALT: 20 U/L (ref 14–54)
ANION GAP: 5 (ref 5–15)
AST: 21 U/L (ref 15–41)
BILIRUBIN TOTAL: 0.5 mg/dL (ref 0.3–1.2)
BUN: 11 mg/dL (ref 6–20)
CALCIUM: 9 mg/dL (ref 8.9–10.3)
CO2: 20 mmol/L — ABNORMAL LOW (ref 22–32)
CREATININE: 0.88 mg/dL (ref 0.44–1.00)
Chloride: 117 mmol/L — ABNORMAL HIGH (ref 101–111)
Glucose, Bld: 88 mg/dL (ref 65–99)
Potassium: 3.4 mmol/L — ABNORMAL LOW (ref 3.5–5.1)
Sodium: 142 mmol/L (ref 135–145)
TOTAL PROTEIN: 6.8 g/dL (ref 6.5–8.1)

## 2016-11-18 ENCOUNTER — Other Ambulatory Visit: Payer: Self-pay | Admitting: Internal Medicine

## 2016-11-18 ENCOUNTER — Other Ambulatory Visit: Payer: Medicare Other

## 2016-11-18 ENCOUNTER — Ambulatory Visit: Payer: Medicare Other | Admitting: Oncology

## 2016-11-18 ENCOUNTER — Inpatient Hospital Stay: Payer: Medicare Other

## 2016-11-18 VITALS — BP 149/71 | HR 70 | Temp 98.3°F | Resp 18

## 2016-11-18 DIAGNOSIS — Z171 Estrogen receptor negative status [ER-]: Secondary | ICD-10-CM | POA: Diagnosis not present

## 2016-11-18 DIAGNOSIS — R739 Hyperglycemia, unspecified: Secondary | ICD-10-CM | POA: Diagnosis not present

## 2016-11-18 DIAGNOSIS — Z5111 Encounter for antineoplastic chemotherapy: Secondary | ICD-10-CM | POA: Diagnosis not present

## 2016-11-18 DIAGNOSIS — C50412 Malignant neoplasm of upper-outer quadrant of left female breast: Secondary | ICD-10-CM | POA: Diagnosis not present

## 2016-11-18 DIAGNOSIS — Z5112 Encounter for antineoplastic immunotherapy: Secondary | ICD-10-CM | POA: Diagnosis not present

## 2016-11-18 DIAGNOSIS — I10 Essential (primary) hypertension: Secondary | ICD-10-CM | POA: Diagnosis not present

## 2016-11-18 DIAGNOSIS — E785 Hyperlipidemia, unspecified: Secondary | ICD-10-CM | POA: Diagnosis not present

## 2016-11-18 DIAGNOSIS — D6481 Anemia due to antineoplastic chemotherapy: Secondary | ICD-10-CM | POA: Diagnosis not present

## 2016-11-18 DIAGNOSIS — Z7689 Persons encountering health services in other specified circumstances: Secondary | ICD-10-CM | POA: Diagnosis not present

## 2016-11-18 DIAGNOSIS — D72829 Elevated white blood cell count, unspecified: Secondary | ICD-10-CM | POA: Diagnosis not present

## 2016-11-18 DIAGNOSIS — K219 Gastro-esophageal reflux disease without esophagitis: Secondary | ICD-10-CM | POA: Diagnosis not present

## 2016-11-18 DIAGNOSIS — N281 Cyst of kidney, acquired: Secondary | ICD-10-CM | POA: Diagnosis not present

## 2016-11-18 DIAGNOSIS — I700 Atherosclerosis of aorta: Secondary | ICD-10-CM | POA: Diagnosis not present

## 2016-11-18 DIAGNOSIS — Z79899 Other long term (current) drug therapy: Secondary | ICD-10-CM | POA: Diagnosis not present

## 2016-11-18 DIAGNOSIS — Z7982 Long term (current) use of aspirin: Secondary | ICD-10-CM | POA: Diagnosis not present

## 2016-11-18 MED ORDER — SODIUM CHLORIDE 0.9 % IV SOLN
420.0000 mg | Freq: Once | INTRAVENOUS | Status: AC
  Administered 2016-11-18: 420 mg via INTRAVENOUS
  Filled 2016-11-18: qty 14

## 2016-11-18 MED ORDER — ACETAMINOPHEN 325 MG PO TABS
650.0000 mg | ORAL_TABLET | Freq: Once | ORAL | Status: AC
  Administered 2016-11-18: 650 mg via ORAL
  Filled 2016-11-18: qty 2

## 2016-11-18 MED ORDER — PALONOSETRON HCL INJECTION 0.25 MG/5ML
0.2500 mg | Freq: Once | INTRAVENOUS | Status: AC
  Administered 2016-11-18: 0.25 mg via INTRAVENOUS
  Filled 2016-11-18: qty 5

## 2016-11-18 MED ORDER — SODIUM CHLORIDE 0.9 % IV SOLN
Freq: Once | INTRAVENOUS | Status: AC
  Administered 2016-11-18: 10:00:00 via INTRAVENOUS
  Filled 2016-11-18: qty 1000

## 2016-11-18 MED ORDER — DEXAMETHASONE SODIUM PHOSPHATE 10 MG/ML IJ SOLN
10.0000 mg | Freq: Once | INTRAMUSCULAR | Status: AC
  Administered 2016-11-18: 10 mg via INTRAVENOUS
  Filled 2016-11-18: qty 1

## 2016-11-18 MED ORDER — HEPARIN SOD (PORK) LOCK FLUSH 100 UNIT/ML IV SOLN
500.0000 [IU] | Freq: Once | INTRAVENOUS | Status: AC | PRN
  Administered 2016-11-18: 500 [IU]
  Filled 2016-11-18: qty 5

## 2016-11-18 MED ORDER — TRASTUZUMAB CHEMO 150 MG IV SOLR
450.0000 mg | Freq: Once | INTRAVENOUS | Status: AC
  Administered 2016-11-18: 450 mg via INTRAVENOUS
  Filled 2016-11-18: qty 21.43

## 2016-11-18 MED ORDER — DEXTROSE 5 % IV SOLN
60.0000 mg/m2 | Freq: Once | INTRAVENOUS | Status: AC
  Administered 2016-11-18: 110 mg via INTRAVENOUS
  Filled 2016-11-18: qty 11

## 2016-11-18 MED ORDER — SODIUM CHLORIDE 0.9% FLUSH
10.0000 mL | INTRAVENOUS | Status: DC | PRN
  Administered 2016-11-18: 10 mL
  Filled 2016-11-18: qty 10

## 2016-11-18 MED ORDER — SODIUM CHLORIDE 0.9 % IV SOLN: 10.0000 mg | Freq: Once | INTRAVENOUS | Status: DC

## 2016-11-18 MED ORDER — DIPHENHYDRAMINE HCL 25 MG PO CAPS
25.0000 mg | ORAL_CAPSULE | Freq: Once | ORAL | Status: AC
  Administered 2016-11-18: 25 mg via ORAL
  Filled 2016-11-18: qty 1

## 2016-11-18 MED ORDER — SODIUM CHLORIDE 0.9 % IV SOLN
500.0000 mg | Freq: Once | INTRAVENOUS | Status: AC
  Administered 2016-11-18: 500 mg via INTRAVENOUS
  Filled 2016-11-18: qty 50

## 2016-11-18 MED ORDER — PEGFILGRASTIM 6 MG/0.6ML ~~LOC~~ PSKT
6.0000 mg | PREFILLED_SYRINGE | Freq: Once | SUBCUTANEOUS | Status: AC
  Administered 2016-11-18: 6 mg via SUBCUTANEOUS
  Filled 2016-11-18: qty 0.6

## 2016-12-04 NOTE — Progress Notes (Signed)
This person returns today in follow-up. She had her Port-A-Cath placed about 2 weeks ago. She has no new complaints. Her sutures are all doing as expected. They were removed. There is no redness or tenderness or erythema. She will continue her follow-up with Dr. Grayland Ormond.

## 2016-12-06 NOTE — Progress Notes (Deleted)
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  INTERVAL HISTORY: Patient returns to clinic today for further evaluation and consideration of cycle 2 of neoadjuvant Taxotere, carboplatin, Herceptin, and Perjeta. She currently feels well and is asymptomatic. She continues to be anxious. She has no neurologic complaints. She denies any recent fevers or illnesses. She has a good appetite and denies weight loss. She has no chest pain or shortness of breath. She denies any nausea, vomiting, constipation, or diarrhea. She has no urinary complaints. Patient offers no further specific complaints today.  REVIEW OF SYSTEMS:   Review of Systems  Constitutional: Negative.  Negative for fever, malaise/fatigue and weight loss.  Respiratory: Negative.  Negative for cough.   Cardiovascular: Negative.  Negative for chest pain and leg swelling.  Gastrointestinal: Negative.  Negative for abdominal pain.  Genitourinary: Negative.   Musculoskeletal: Negative.   Neurological: Negative.  Negative for weakness.  Psychiatric/Behavioral: The patient is nervous/anxious.     As per HPI. Otherwise, a complete review of systems is negative.  PAST MEDICAL HISTORY: Past Medical History:  Diagnosis Date  . Breast cancer ([place])   . Breast mass, left 09/22/2016   RECOMMENDATION: Ultrasound-guided core biopsies of masses at left breast 2 o'clock 2 cm from nipple, left breast 2 o'clock 5 cm from nipple, abnormal left axillary lymph nodes.  Marland Kitchen GERD (gastroesophageal reflux disease)   . Hyperglycemia   . Hyperlipidemia   . Obesity   . Postmenopausal     PAST SURGICAL HISTORY: Past Surgical History:  Procedure  Laterality Date  . ABDOMINAL HYSTERECTOMY    . BREAST BIOPSY Left 09/30/2016   US biopsy of 3 areas, awaiting pathology  . ESOPHAGOGASTRODUODENOSCOPY (EGD) WITH PROPOFOL N/A 12/24/2014   Procedure: ESOPHAGOGASTRODUODENOSCOPY (EGD) WITH PROPOFOL;  Surgeon: Manya Silvas, MD;  [facility] END[facility]COPY;  Service: Endoscopy;  Laterality: N/A;  . PORTACATH PLACEMENT Right 10/27/2016   Procedure: INSERTION PORT-A-CATH;  Surgeon: Nestor Lewandowsky, MD;  Location: [facility] ORS;  Service: General;  Laterality: Right;  . TONSILECTOMY, ADENOIDECTOMY, BILATERAL MYRINGOTOMY AND TUBES      FAMILY HISTORY: Family History  Problem Relation Age of Onset  . Stroke Mother   . Stroke Father   . Cancer Neg Hx   . Depression Neg Hx     ADVANCED DIRECTIVES (Y/N):  N  HEALTH MAINTENANCE: Social History  Substance Use Topics  . Smoking status: Never Smoker  . Smokeless tobacco: Never Used  . Alcohol use No     Colonoscopy:  PAP:  Bone density:  Lipid panel:  Allergies  Allergen Reactions  . Latex Itching  . Penicillins Itching and Rash    Has patient had a PCN reaction causing immediate rash, facial/tongue/throat swelling, SOB or lightheadedness with hypotension: Yes Has patient had a PCN reaction causing severe rash involving mucus membranes or skin necrosis: No Has patient had a PCN reaction that required hospitalization No Has patient had a PCN reaction occurring within the last 10 years: No If all of the above answers are "NO", then may proceed with Cephalosporin use.     Current Outpatient Prescriptions  Medication Sig Dispense Refill  . acetaminophen (TYLENOL) 500 MG tablet Take 1,000 mg by mouth every 6 (six) hours as needed  for mild pain.    Marland Kitchen aspirin 81 MG tablet Take 81 mg by mouth daily.    Marland Kitchen atorvastatin (LIPITOR) 40 MG tablet Take 1 tablet (40 mg total) by mouth at bedtime. (Patient taking differently: Take 40 mg by mouth 3 (three) times a week. In the evening  Depends on patient's  preference if takes 20-40 mg) 30 tablet 1  . Cholecalciferol (VITAMIN D) 2000 units tablet Take 2,000 Units by mouth daily. One by mouth twice a week    . clindamycin (CLEOCIN) 300 MG capsule Take 1 capsule (300 mg total) by mouth 3 (three) times daily. 30 capsule 0  . ferrous sulfate 325 (65 FE) MG EC tablet Take 325 mg by mouth every other day.     . fluticasone (FLONASE) 50 MCG/ACT nasal spray Place 1 spray into both nostrils daily as needed for allergies or rhinitis.    Marland Kitchen lidocaine-prilocaine (EMLA) cream Apply to affected area once 30 g 3  . Multiple Vitamin (MULTIVITAMIN WITH MINERALS) TABS tablet Take 1 tablet by mouth every other day.     Marland Kitchen omeprazole (PRILOSEC) 20 MG capsule Take 20 mg by mouth daily as needed (heartburn).     . ondansetron (ZOFRAN) 8 MG tablet Take 1 tablet (8 mg total) by mouth 2 (two) times daily as needed for refractory nausea / vomiting. 60 tablet 2  . prochlorperazine (COMPAZINE) 10 MG tablet Take 1 tablet (10 mg total) by mouth every 6 (six) hours as needed (Nausea or vomiting). 60 tablet 2   No current facility-administered medications for this visit.     OBJECTIVE: There were no vitals filed for this visit.   There is no height or weight on file to calculate BMI.    ECOG FS:0 - Asymptomatic  General: Well-developed, well-nourished, no acute distress. Eyes: Pink conjunctiva, anicteric sclera. Breasts: Easily palpable left breast mass. Exam deferred today. Chest wall: Port site without erythema or induration. Lungs: Clear to auscultation bilaterally. Heart: Regular rate and rhythm. No rubs, murmurs, or gallops. Abdomen: Soft, nontender, nondistended. No organomegaly noted, normoactive bowel sounds. Musculoskeletal: No edema, cyanosis, or clubbing. Neuro: Alert, answering all questions appropriately. Cranial nerves grossly intact. Skin: No rashes or petechiae noted. Psych: Normal affect.  LAB RESULTS:  Lab Results  Component Value Date   NA 142  11/17/2016   K 3.4 (L) 11/17/2016   CL 117 (H) 11/17/2016   CO2 20 (L) 11/17/2016   GLUCOSE 88 11/17/2016   BUN 11 11/17/2016   CREATININE 0.88 11/17/2016   CALCIUM 9.0 11/17/2016   PROT 6.8 11/17/2016   ALBUMIN 3.5 11/17/2016   AST 21 11/17/2016   ALT 20 11/17/2016   ALKPHOS 108 11/17/2016   BILITOT 0.5 11/17/2016   GFRNONAA >60 11/17/2016   GFRAA >60 11/17/2016    Lab Results  Component Value Date   WBC 7.1 11/17/2016   NEUTROABS 3.9 11/17/2016   HGB 10.7 (L) 11/17/2016   HCT 31.0 (L) 11/17/2016   MCV 87.3 11/17/2016   PLT 257 11/17/2016     STUDIES: No results found.  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  PLAN:    1. 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 Patient will then require surgery followed by adjuvant XRT. Patient will receive Herceptin only every  3 weeks for an entire year after her surgery. An aromatase inhibitor would not offer benefit given the ER/PR negativity of her disease. Pretreatment MUGA scan on October 16, 2016 adequate to proceed with an EF of 65%. Repeat MUGA scan in June 2018. PET scan results reviewed independently and reported as above with no obvious metastatic disease other than her known disease in breast and axilla. Proceed with cycle 2 of 6 of neoadjuvant treatment. Patient also receives Neulasta support.  Return to clinic in 3 weeks for further evaluation and consideration of cycle 3. 2. Port cellulitis: Resolved. 3. Leukocytosis: Secondary to Neulasta. Monitor.  4. Renal insufficiency: Creatinine is within normal limits today. 5. Hypertension: Blood pressure mildly elevated, monitor.  Patient expressed understanding and was in agreement with  this plan. She also understands that She can call clinic at any time with any questions, concerns, or complaints.   Cancer Staging Malignant neoplasm of upper-outer quadrant of left female breast [facility]) Staging form: Breast, AJCC 8th Edition - Clinical stage from 10/11/2016: Stage IIB (cT2, cN1, cM0, G3, ER: Negative, PR: Negative, HER2: Positive) - Signed by Lloyd Huger, MD on 10/11/2016   Lloyd Huger, MD   12/06/2016 11:00 PM

## 2016-12-08 ENCOUNTER — Inpatient Hospital Stay: Payer: Medicare Other

## 2016-12-08 ENCOUNTER — Inpatient Hospital Stay: Payer: Medicare Other | Admitting: Oncology

## 2016-12-08 NOTE — Progress Notes (Signed)
  Oncology Nurse Navigator Documentation  Navigator Location: [facility] (12/08/16 1100)   )Navigator Encounter Type: Telephone (12/08/16 1100) Telephone: [place] Call (12/08/16 1100)                     Treatment Phase: Active Tx (12/08/16 1100) Barriers/Navigation Needs: Coordination of Care (12/08/16 1100)   Interventions: Coordination of Care (12/08/16 1100)   Coordination of Care: Appts (12/08/16 1100)              Acuity Level 3: Coordination of multimodality treatment;Ongoing guidance and education provided throughout treatment (12/08/16 1100)   Time Spent with Patient: 30 (12/08/16 1100)   Patient was a 'No Show" for treatment today.  Unable to come today for labs, see M.D, and receive treatment tomorrow due to obligations to pick up granddaughter from school.  Worked with Quentin Angst in scheduling to schedule patient for treatment 12/15/16 at 0815.  Patient is aware.

## 2016-12-14 NOTE — Progress Notes (Signed)
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  INTERVAL HISTORY: Patient returns to clinic today for further evaluation and consideration of cycle 3 of neoadjuvant Taxotere, carboplatin, Herceptin, and Perjeta. She missed treatment last week secondary to a funeral for family member. She is highly anxious and tearful today, but refuses to discuss anything. She continues to be anxious. She has no neurologic complaints. She denies any recent fevers or illnesses. She has a good appetite and denies weight loss. She has no chest pain or shortness of breath. She denies any nausea, vomiting, constipation, or diarrhea. She has no urinary complaints. Patient offers no further specific complaints today.  REVIEW OF SYSTEMS:   Review of Systems  Constitutional: Negative.  Negative for fever, malaise/fatigue and weight loss.  Respiratory: Negative.  Negative for cough and shortness of breath.   Cardiovascular: Negative.  Negative for chest pain and leg swelling.  Gastrointestinal: Negative.  Negative for abdominal pain.  Genitourinary: Negative.   Musculoskeletal: Negative.   Skin: Negative.  Negative for rash.  Neurological: Negative.  Negative for weakness.  Psychiatric/Behavioral: Positive for depression. The patient is nervous/anxious.     As per HPI. Otherwise, a complete review of systems is negative.  PAST MEDICAL HISTORY: Past Medical History:  Diagnosis Date  . Breast cancer ([place])   . Breast mass, left 09/22/2016   RECOMMENDATION: Ultrasound-guided core biopsies of masses at left breast 2 o'clock 2 cm from nipple, left breast 2 o'clock 5 cm from nipple, abnormal left axillary lymph nodes.   Marland Kitchen GERD (gastroesophageal reflux disease)   . Hyperglycemia   . Hyperlipidemia   . Obesity   . Postmenopausal     PAST SURGICAL HISTORY: Past Surgical History:  Procedure Laterality Date  . ABDOMINAL HYSTERECTOMY    . BREAST BIOPSY Left 09/30/2016   US biopsy of 3 areas, awaiting pathology  . ESOPHAGOGASTRODUODENOSCOPY (EGD) WITH PROPOFOL N/A 12/24/2014   Procedure: ESOPHAGOGASTRODUODENOSCOPY (EGD) WITH PROPOFOL;  Surgeon: Manya Silvas, MD;  [facility] ENDOSCOPY;  Service: Endoscopy;  Laterality: N/A;  . PORTACATH PLACEMENT Right 10/27/2016   Procedure: INSERTION PORT-A-CATH;  Surgeon: Nestor Lewandowsky, MD;  Location: [facility] ORS;  Service: General;  Laterality: Right;  . TONSILECTOMY, ADENOIDECTOMY, BILATERAL MYRINGOTOMY AND TUBES      FAMILY HISTORY: Family History  Problem Relation Age of Onset  . Stroke Mother   . Stroke Father   . Cancer Neg Hx   . Depression Neg Hx     ADVANCED DIRECTIVES (Y/N):  N  HEALTH MAINTENANCE: Social History  Substance Use Topics  . Smoking status: Never Smoker  . Smokeless tobacco: Never Used  . Alcohol use No     Colonoscopy:  PAP:  Bone density:  Lipid panel:  Allergies  Allergen Reactions  . Latex Itching  . Penicillins Itching and Rash    Has patient had a PCN reaction causing immediate rash, facial/tongue/throat swelling, SOB or lightheadedness with hypotension: Yes Has patient had a PCN reaction causing severe rash involving mucus membranes or skin necrosis: No Has patient had a PCN reaction that required hospitalization No Has patient had a PCN reaction occurring within the last 10 years: No If all of the above answers are "NO", then may proceed with Cephalosporin  use.     Current Outpatient Prescriptions  Medication Sig Dispense Refill  . acetaminophen (TYLENOL) 500 MG tablet Take 1,000 mg by mouth every 6 (six) hours as needed for mild pain.    Marland Kitchen aspirin 81 MG tablet Take 81 mg by mouth daily.    Marland Kitchen atorvastatin  (LIPITOR) 40 MG tablet Take 1 tablet (40 mg total) by mouth at bedtime. (Patient taking differently: Take 40 mg by mouth 3 (three) times a week. In the evening  Depends on patient's preference if takes 20-40 mg) 30 tablet 1  . Cholecalciferol (VITAMIN D) 2000 units tablet Take 2,000 Units by mouth daily. One by mouth twice a week    . clindamycin (CLEOCIN) 300 MG capsule Take 1 capsule (300 mg total) by mouth 3 (three) times daily. 30 capsule 0  . ferrous sulfate 325 (65 FE) MG EC tablet Take 325 mg by mouth every other day.     . fluticasone (FLONASE) 50 MCG/ACT nasal spray Place 1 spray into both nostrils daily as needed for allergies or rhinitis.    Marland Kitchen lidocaine-prilocaine (EMLA) cream Apply to affected area once 30 g 3  . Multiple Vitamin (MULTIVITAMIN WITH MINERALS) TABS tablet Take 1 tablet by mouth every other day.     Marland Kitchen omeprazole (PRILOSEC) 20 MG capsule Take 20 mg by mouth daily as needed (heartburn).     . ondansetron (ZOFRAN) 8 MG tablet Take 1 tablet (8 mg total) by mouth 2 (two) times daily as needed for refractory nausea / vomiting. 60 tablet 2  . prochlorperazine (COMPAZINE) 10 MG tablet Take 1 tablet (10 mg total) by mouth every 6 (six) hours as needed (Nausea or vomiting). 60 tablet 2   No current facility-administered medications for this visit.     OBJECTIVE: Vitals:   12/15/16 0851  BP: 132/80  Pulse: 73  Resp: 20  Temp: 97 F (36.1 C)     Body mass index is 31.97 kg/m.    ECOG FS:0 - Asymptomatic  General: Well-developed, well-nourished, no acute distress. Eyes: Pink conjunctiva, anicteric sclera. Breasts: Easily palpable left breast mass. Exam deferred today. Chest wall: Port site without erythema or induration. Lungs: Clear to auscultation bilaterally. Heart: Regular rate and rhythm. No rubs, murmurs, or gallops. Abdomen: Soft, nontender, nondistended. No organomegaly noted, normoactive bowel sounds. Musculoskeletal: No edema, cyanosis, or clubbing. Neuro:  Alert, answering all questions appropriately. Cranial nerves grossly intact. Skin: No rashes or petechiae noted. Psych: Normal affect.  LAB RESULTS:  Lab Results  Component Value Date   NA 142 12/15/2016   K 3.8 12/15/2016   CL 112 (H) 12/15/2016   CO2 25 12/15/2016   GLUCOSE 87 12/15/2016   BUN 17 12/15/2016   CREATININE 1.14 (H) 12/15/2016   CALCIUM 9.1 12/15/2016   PROT 6.9 12/15/2016   ALBUMIN 3.5 12/15/2016   AST 20 12/15/2016   ALT 16 12/15/2016   ALKPHOS 114 12/15/2016   BILITOT 0.5 12/15/2016   GFRNONAA 45 (L) 12/15/2016   GFRAA 52 (L) 12/15/2016    Lab Results  Component Value Date   WBC 4.8 12/15/2016   NEUTROABS 2.5 12/15/2016   HGB 9.6 (L) 12/15/2016   HCT 27.8 (L) 12/15/2016   MCV 90.2 12/15/2016   PLT 236 12/15/2016     STUDIES: No results found.  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  PLAN:    1. Clinical stage IIB ER/PR negative, HER-2 positive invasive  carcinoma of the upper outer quadrant of the left breast: Given the size of patient's malignancy, have recommended neoadjuvant treatment with Taxotere, carboplatinum, Herceptin, and Perjeta. Patient will then require surgery followed by adjuvant XRT. Patient will receive Herceptin only every 3 weeks for an entire year after her surgery. An aromatase inhibitor would not offer benefit given the ER/PR negativity of her disease. Pretreatment MUGA scan on October 16, 2016 adequate to proceed with an EF of 65%. Repeat MUGA scan in June 2018. PET scan results reviewed independently with no obvious metastatic disease other than her known disease in breast and axilla. Patient states she cannot stay for treatment today, therefore she will return tomorrow for consideration of cycle 3 of 6 of neoadjuvant treatment. Patient also receives Neulasta support.  Return to clinic in 3 weeks for further evaluation and consideration of cycle  4. 2. Anemia: Secondary chemotherapy, monitor. 3. Leukocytosis: Resolved. Secondary to Neulasta. Monitor.  4. Renal insufficiency: Creatinine is within normal limits today. 5. Hypertension: Blood pressure mildly elevated, monitor. 6. Anxiety: Patient refused to discuss any issues, have asked breast navigator to further inquire.  Patient expressed understanding and was in agreement with this plan. She also understands that She can call clinic at any time with any questions, concerns, or complaints.   Cancer Staging Malignant neoplasm of upper-outer quadrant of left female breast [facility]) Staging form: Breast, AJCC 8th Edition - Clinical stage from 10/11/2016: Stage IIB (cT2, cN1, cM0, G3, ER: Negative, PR: Negative, HER2: Positive) - Signed by Lloyd Huger, MD on 10/11/2016   Lloyd Huger, MD   12/18/2016 2:07 PM

## 2016-12-15 ENCOUNTER — Inpatient Hospital Stay: Payer: Medicare Other

## 2016-12-15 ENCOUNTER — Inpatient Hospital Stay (HOSPITAL_BASED_OUTPATIENT_CLINIC_OR_DEPARTMENT_OTHER): Payer: Medicare Other | Admitting: Oncology

## 2016-12-15 VITALS — BP 132/80 | HR 73 | Temp 97.0°F | Resp 20 | Wt 169.2 lb

## 2016-12-15 DIAGNOSIS — Z79899 Other long term (current) drug therapy: Secondary | ICD-10-CM | POA: Diagnosis not present

## 2016-12-15 DIAGNOSIS — Z7982 Long term (current) use of aspirin: Secondary | ICD-10-CM

## 2016-12-15 DIAGNOSIS — E669 Obesity, unspecified: Secondary | ICD-10-CM | POA: Diagnosis not present

## 2016-12-15 DIAGNOSIS — D72829 Elevated white blood cell count, unspecified: Secondary | ICD-10-CM | POA: Diagnosis not present

## 2016-12-15 DIAGNOSIS — C50412 Malignant neoplasm of upper-outer quadrant of left female breast: Secondary | ICD-10-CM

## 2016-12-15 DIAGNOSIS — E785 Hyperlipidemia, unspecified: Secondary | ICD-10-CM | POA: Diagnosis not present

## 2016-12-15 DIAGNOSIS — D6481 Anemia due to antineoplastic chemotherapy: Secondary | ICD-10-CM

## 2016-12-15 DIAGNOSIS — N281 Cyst of kidney, acquired: Secondary | ICD-10-CM | POA: Diagnosis not present

## 2016-12-15 DIAGNOSIS — Z7689 Persons encountering health services in other specified circumstances: Secondary | ICD-10-CM | POA: Diagnosis not present

## 2016-12-15 DIAGNOSIS — I700 Atherosclerosis of aorta: Secondary | ICD-10-CM | POA: Diagnosis not present

## 2016-12-15 DIAGNOSIS — K219 Gastro-esophageal reflux disease without esophagitis: Secondary | ICD-10-CM | POA: Diagnosis not present

## 2016-12-15 DIAGNOSIS — I10 Essential (primary) hypertension: Secondary | ICD-10-CM

## 2016-12-15 DIAGNOSIS — F419 Anxiety disorder, unspecified: Secondary | ICD-10-CM | POA: Diagnosis not present

## 2016-12-15 DIAGNOSIS — Z5112 Encounter for antineoplastic immunotherapy: Secondary | ICD-10-CM | POA: Diagnosis not present

## 2016-12-15 DIAGNOSIS — Z171 Estrogen receptor negative status [ER-]: Secondary | ICD-10-CM

## 2016-12-15 DIAGNOSIS — Z5111 Encounter for antineoplastic chemotherapy: Secondary | ICD-10-CM | POA: Diagnosis not present

## 2016-12-15 DIAGNOSIS — R739 Hyperglycemia, unspecified: Secondary | ICD-10-CM | POA: Diagnosis not present

## 2016-12-15 LAB — COMPREHENSIVE METABOLIC PANEL
ALBUMIN: 3.5 g/dL (ref 3.5–5.0)
ALK PHOS: 114 U/L (ref 38–126)
ALT: 16 U/L (ref 14–54)
ANION GAP: 5 (ref 5–15)
AST: 20 U/L (ref 15–41)
BUN: 17 mg/dL (ref 6–20)
CALCIUM: 9.1 mg/dL (ref 8.9–10.3)
CHLORIDE: 112 mmol/L — AB (ref 101–111)
CO2: 25 mmol/L (ref 22–32)
CREATININE: 1.14 mg/dL — AB (ref 0.44–1.00)
GFR calc Af Amer: 52 mL/min — ABNORMAL LOW (ref >60)
GFR calc non Af Amer: 45 mL/min — ABNORMAL LOW (ref >60)
GLUCOSE: 87 mg/dL (ref 65–99)
Potassium: 3.8 mmol/L (ref 3.5–5.1)
SODIUM: 142 mmol/L (ref 135–145)
Total Bilirubin: 0.5 mg/dL (ref 0.3–1.2)
Total Protein: 6.9 g/dL (ref 6.5–8.1)

## 2016-12-15 LAB — CBC WITH DIFFERENTIAL/PLATELET
BASOS ABS: 0 10*3/uL (ref 0–0.1)
BASOS PCT: 1 %
EOS ABS: 0.3 10*3/uL (ref 0–0.7)
Eosinophils Relative: 6 %
HCT: 27.8 % — ABNORMAL LOW (ref 35.0–47.0)
HEMOGLOBIN: 9.6 g/dL — AB (ref 12.0–16.0)
Lymphocytes Relative: 36 %
Lymphs Abs: 1.7 10*3/uL (ref 1.0–3.6)
MCH: 31.1 pg (ref 26.0–34.0)
MCHC: 34.5 g/dL (ref 32.0–36.0)
MCV: 90.2 fL (ref 80.0–100.0)
Monocytes Absolute: 0.3 10*3/uL (ref 0.2–0.9)
Monocytes Relative: 6 %
NEUTROS PCT: 51 %
Neutro Abs: 2.5 10*3/uL (ref 1.4–6.5)
Platelets: 236 10*3/uL (ref 150–440)
RBC: 3.08 MIL/uL — AB (ref 3.80–5.20)
RDW: 18 % — ABNORMAL HIGH (ref 11.5–14.5)
WBC: 4.8 10*3/uL (ref 3.6–11.0)

## 2016-12-15 NOTE — Progress Notes (Signed)
Patient denies any concerns today.  

## 2016-12-16 ENCOUNTER — Inpatient Hospital Stay: Payer: Medicare Other

## 2016-12-16 VITALS — BP 129/78 | HR 87 | Temp 98.2°F | Resp 18

## 2016-12-16 DIAGNOSIS — C50412 Malignant neoplasm of upper-outer quadrant of left female breast: Secondary | ICD-10-CM | POA: Diagnosis not present

## 2016-12-16 DIAGNOSIS — R739 Hyperglycemia, unspecified: Secondary | ICD-10-CM | POA: Diagnosis not present

## 2016-12-16 DIAGNOSIS — D72829 Elevated white blood cell count, unspecified: Secondary | ICD-10-CM | POA: Diagnosis not present

## 2016-12-16 DIAGNOSIS — Z7689 Persons encountering health services in other specified circumstances: Secondary | ICD-10-CM | POA: Diagnosis not present

## 2016-12-16 DIAGNOSIS — N281 Cyst of kidney, acquired: Secondary | ICD-10-CM | POA: Diagnosis not present

## 2016-12-16 DIAGNOSIS — Z171 Estrogen receptor negative status [ER-]: Secondary | ICD-10-CM | POA: Diagnosis not present

## 2016-12-16 DIAGNOSIS — I700 Atherosclerosis of aorta: Secondary | ICD-10-CM | POA: Diagnosis not present

## 2016-12-16 DIAGNOSIS — Z79899 Other long term (current) drug therapy: Secondary | ICD-10-CM | POA: Diagnosis not present

## 2016-12-16 DIAGNOSIS — I10 Essential (primary) hypertension: Secondary | ICD-10-CM | POA: Diagnosis not present

## 2016-12-16 DIAGNOSIS — D6481 Anemia due to antineoplastic chemotherapy: Secondary | ICD-10-CM | POA: Diagnosis not present

## 2016-12-16 DIAGNOSIS — E785 Hyperlipidemia, unspecified: Secondary | ICD-10-CM | POA: Diagnosis not present

## 2016-12-16 DIAGNOSIS — Z5111 Encounter for antineoplastic chemotherapy: Secondary | ICD-10-CM | POA: Diagnosis not present

## 2016-12-16 DIAGNOSIS — K219 Gastro-esophageal reflux disease without esophagitis: Secondary | ICD-10-CM | POA: Diagnosis not present

## 2016-12-16 DIAGNOSIS — Z5112 Encounter for antineoplastic immunotherapy: Secondary | ICD-10-CM | POA: Diagnosis not present

## 2016-12-16 DIAGNOSIS — Z7982 Long term (current) use of aspirin: Secondary | ICD-10-CM | POA: Diagnosis not present

## 2016-12-16 MED ORDER — CARBOPLATIN CHEMO INJECTION 450 MG/45ML
381.0000 mg | Freq: Once | INTRAVENOUS | Status: AC
  Administered 2016-12-16: 380 mg via INTRAVENOUS
  Filled 2016-12-16: qty 38

## 2016-12-16 MED ORDER — DOCETAXEL CHEMO INJECTION 160 MG/16ML
60.0000 mg/m2 | Freq: Once | INTRAVENOUS | Status: AC
  Administered 2016-12-16: 110 mg via INTRAVENOUS
  Filled 2016-12-16: qty 11

## 2016-12-16 MED ORDER — DEXAMETHASONE SODIUM PHOSPHATE 10 MG/ML IJ SOLN
10.0000 mg | Freq: Once | INTRAMUSCULAR | Status: AC
  Administered 2016-12-16: 10 mg via INTRAVENOUS
  Filled 2016-12-16: qty 1

## 2016-12-16 MED ORDER — DIPHENHYDRAMINE HCL 25 MG PO CAPS
25.0000 mg | ORAL_CAPSULE | Freq: Once | ORAL | Status: AC
  Administered 2016-12-16: 25 mg via ORAL
  Filled 2016-12-16: qty 1

## 2016-12-16 MED ORDER — ACETAMINOPHEN 325 MG PO TABS
650.0000 mg | ORAL_TABLET | Freq: Once | ORAL | Status: AC
  Administered 2016-12-16: 650 mg via ORAL
  Filled 2016-12-16: qty 2

## 2016-12-16 MED ORDER — SODIUM CHLORIDE 0.9 % IV SOLN
Freq: Once | INTRAVENOUS | Status: AC
Start: 2016-12-16 — End: 2016-12-16
  Administered 2016-12-16: 09:00:00 via INTRAVENOUS
  Filled 2016-12-16: qty 1000

## 2016-12-16 MED ORDER — SODIUM CHLORIDE 0.9 % IV SOLN
420.0000 mg | Freq: Once | INTRAVENOUS | Status: AC
  Administered 2016-12-16: 420 mg via INTRAVENOUS
  Filled 2016-12-16: qty 14

## 2016-12-16 MED ORDER — SODIUM CHLORIDE 0.9% FLUSH
10.0000 mL | INTRAVENOUS | Status: DC | PRN
  Administered 2016-12-16: 10 mL
  Filled 2016-12-16: qty 10

## 2016-12-16 MED ORDER — PALONOSETRON HCL INJECTION 0.25 MG/5ML
0.2500 mg | Freq: Once | INTRAVENOUS | Status: AC
  Administered 2016-12-16: 0.25 mg via INTRAVENOUS
  Filled 2016-12-16: qty 5

## 2016-12-16 MED ORDER — HEPARIN SOD (PORK) LOCK FLUSH 100 UNIT/ML IV SOLN
500.0000 [IU] | Freq: Once | INTRAVENOUS | Status: AC | PRN
  Administered 2016-12-16: 500 [IU]
  Filled 2016-12-16: qty 5

## 2016-12-16 MED ORDER — SODIUM CHLORIDE 0.9 % IV SOLN
450.0000 mg | Freq: Once | INTRAVENOUS | Status: AC
  Administered 2016-12-16: 450 mg via INTRAVENOUS
  Filled 2016-12-16: qty 21.43

## 2016-12-16 MED ORDER — PEGFILGRASTIM 6 MG/0.6ML ~~LOC~~ PSKT
6.0000 mg | PREFILLED_SYRINGE | Freq: Once | SUBCUTANEOUS | Status: AC
  Administered 2016-12-16: 6 mg via SUBCUTANEOUS
  Filled 2016-12-16: qty 0.6

## 2016-12-27 ENCOUNTER — Encounter: Payer: Self-pay | Admitting: Emergency Medicine

## 2016-12-27 ENCOUNTER — Emergency Department
Admission: EM | Admit: 2016-12-27 | Discharge: 2016-12-27 | Disposition: A | Discharge status: Home / Self Care | Payer: Medicare Other | Attending: Emergency Medicine | Admitting: Emergency Medicine

## 2016-12-27 DIAGNOSIS — S20461A Insect bite (nonvenomous) of right back wall of thorax, initial encounter: Secondary | ICD-10-CM | POA: Diagnosis not present

## 2016-12-27 DIAGNOSIS — Z91040 Latex allergy status: Secondary | ICD-10-CM | POA: Insufficient documentation

## 2016-12-27 DIAGNOSIS — Y998 Other external cause status: Secondary | ICD-10-CM | POA: Insufficient documentation

## 2016-12-27 DIAGNOSIS — C50412 Malignant neoplasm of upper-outer quadrant of left female breast: Secondary | ICD-10-CM | POA: Insufficient documentation

## 2016-12-27 DIAGNOSIS — Z79899 Other long term (current) drug therapy: Secondary | ICD-10-CM | POA: Insufficient documentation

## 2016-12-27 DIAGNOSIS — I10 Essential (primary) hypertension: Secondary | ICD-10-CM | POA: Insufficient documentation

## 2016-12-27 DIAGNOSIS — Y9389 Activity, other specified: Secondary | ICD-10-CM | POA: Insufficient documentation

## 2016-12-27 DIAGNOSIS — Y929 Unspecified place or not applicable: Secondary | ICD-10-CM | POA: Insufficient documentation

## 2016-12-27 DIAGNOSIS — Z7982 Long term (current) use of aspirin: Secondary | ICD-10-CM | POA: Insufficient documentation

## 2016-12-27 DIAGNOSIS — W57XXXA Bitten or stung by nonvenomous insect and other nonvenomous arthropods, initial encounter: Secondary | ICD-10-CM | POA: Insufficient documentation

## 2016-12-27 DIAGNOSIS — S30860A Insect bite (nonvenomous) of lower back and pelvis, initial encounter: Secondary | ICD-10-CM | POA: Diagnosis not present

## 2016-12-27 DIAGNOSIS — S20451A Superficial foreign body of right back wall of thorax, initial encounter: Secondary | ICD-10-CM | POA: Diagnosis present

## 2016-12-27 MED ORDER — DOXYCYCLINE HYCLATE 100 MG PO CAPS: 100.0000 mg | ORAL_CAPSULE | Freq: Two times a day (BID) | ORAL | 0 refills | Status: DC

## 2016-12-27 NOTE — ED Provider Notes (Signed)
[facility] Emergency Department Provider Note   ____________________________________________   I have reviewed the triage vital signs and the nursing notes.   HISTORY  Chief Complaint Tick Removal    HPI Maria Patterson is a 76 y.o. female presents with complaints of a tick on the right lower back. Patient reports the tick has been there one day. Patient is currently under chemotherapy treatment for cancer. Patient has no history of past tick borne illnesses. Patient denies any current fever, chills, headache, vision changes, chest pain, chest tightness, shortness of breath, abdominal pain, nausea and vomiting.  Past Medical History:  Diagnosis Date  . Breast cancer ([place])   . Breast mass, left 09/22/2016   RECOMMENDATION: Ultrasound-guided core biopsies of masses at left breast 2 o'clock 2 cm from nipple, left breast 2 o'clock 5 cm from nipple, abnormal left axillary lymph nodes.  Marland Kitchen GERD (gastroesophageal reflux disease)   . Hyperglycemia   . Hyperlipidemia   . Obesity   . Postmenopausal     Patient Active Problem List   Diagnosis Date Noted  . Atherosclerosis of native arteries of extremity with intermittent claudication ([place]) 11/09/2016  . Essential hypertension, benign 11/07/2016  . Alkaline phosphatase elevation 11/07/2016  . Allergic rhinitis 11/07/2016  . Malignant neoplasm of upper-outer quadrant of left female breast ([place]) 10/08/2016  . Breast mass, left 09/22/2016  . Encounter for screening for HIV 09/18/2016  . Preventative health care 09/18/2016  . Osteoporosis screening 09/18/2016  . Postmenopausal 09/18/2016  . Ache in joint 03/09/2016  . Overweight 03/09/2016  . Medication monitoring encounter 10/30/2015  . Hyperglycemia 09/10/2015  . Needs flu shot 09/10/2015  . Need for pneumococcal vaccination 09/10/2015  . GERD without esophagitis 07/01/2012  . Hyperlipidemia LDL goal <100 07/01/2012    Past Surgical History:  Procedure  Laterality Date  . ABDOMINAL HYSTERECTOMY    . BREAST BIOPSY Left 09/30/2016   US biopsy of 3 areas, awaiting pathology  . ESOPHAGOGASTRODUODENOSCOPY (EGD) WITH PROPOFOL N/A 12/24/2014   Procedure: ESOPHAGOGASTRODUODENOSCOPY (EGD) WITH PROPOFOL;  Surgeon: Manya Silvas, MD;  [facility] ENDOSCOPY;  Service: Endoscopy;  Laterality: N/A;  . PORTACATH PLACEMENT Right 10/27/2016   Procedure: INSERTION PORT-A-CATH;  Surgeon: Nestor Lewandowsky, MD;  Location: [facility] ORS;  Service: General;  Laterality: Right;  . TONSILECTOMY, ADENOIDECTOMY, BILATERAL MYRINGOTOMY AND TUBES      Prior to Admission medications   Medication Sig Start Date End Date Taking? Authorizing Provider  acetaminophen (TYLENOL) 500 MG tablet Take 1,000 mg by mouth every 6 (six) hours as needed for mild pain.    [provider]  aspirin 81 MG tablet Take 81 mg by mouth daily.    [provider]  atorvastatin (LIPITOR) 40 MG tablet Take 1 tablet (40 mg total) by mouth at bedtime. Patient taking differently: Take 40 mg by mouth 3 (three) times a week. In the evening  Depends on patient's preference if takes 20-40 mg 09/07/16   Arnetha Courser, MD  Cholecalciferol (VITAMIN D) 2000 units tablet Take 2,000 Units by mouth daily. One by mouth twice a week 09/01/16   Arnetha Courser, MD  clindamycin (CLEOCIN) 300 MG capsule Take 1 capsule (300 mg total) by mouth 3 (three) times daily. 11/01/16   Lisa Roca, MD  doxycycline (VIBRAMYCIN) 100 MG capsule Take 1 capsule (100 mg total) by mouth 2 (two) times daily. 12/27/16   Alika Eppes M, PA-C  ferrous sulfate 325 (65 FE) MG EC tablet Take 325 mg by  mouth every other day.  09/01/16   Arnetha Courser, MD  fluticasone (FLONASE) 50 MCG/ACT nasal spray Place 1 spray into both nostrils daily as needed for allergies or rhinitis.    [provider]  lidocaine-prilocaine (EMLA) cream Apply to affected area once 10/20/16   Lloyd Huger, MD  Multiple Vitamin (MULTIVITAMIN  WITH MINERALS) TABS tablet Take 1 tablet by mouth every other day.  09/01/16   Arnetha Courser, MD  omeprazole (PRILOSEC) 20 MG capsule Take 20 mg by mouth daily as needed (heartburn).     [provider]  ondansetron (ZOFRAN) 8 MG tablet Take 1 tablet (8 mg total) by mouth 2 (two) times daily as needed for refractory nausea / vomiting. 10/20/16   Lloyd Huger, MD  prochlorperazine (COMPAZINE) 10 MG tablet Take 1 tablet (10 mg total) by mouth every 6 (six) hours as needed (Nausea or vomiting). 10/20/16   Lloyd Huger, MD    Allergies Latex and Penicillins  Family History  Problem Relation Age of Onset  . Stroke Mother   . Stroke Father   . Cancer Neg Hx   . Depression Neg Hx     Social History Social History  Substance Use Topics  . Smoking status: Never Smoker  . Smokeless tobacco: Never Used  . Alcohol use No    Review of Systems Constitutional: Negative for fever/chills Eyes: No visual changes. ENT:  Negative for sore throat and for difficulty swallowing Cardiovascular: Denies chest pain. Respiratory: Denies cough Denies shortness of breath. Gastrointestinal: No abdominal pain.  No nausea, vomiting, diarrhea. Skin:Negative for rash. Take attachment along the right lower back. Neurological: Negative for headaches.  Negative focal weakness or numbness.  ____________________________________________   PHYSICAL EXAM:  VITAL SIGNS: ED Triage Vitals  Enc Vitals Group     BP 12/27/16 0936 133/78     Pulse Rate 12/27/16 0936 91     Resp 12/27/16 0936 18     Temp 12/27/16 0936 98.1 F (36.7 C)     Temp Source 12/27/16 0936 Oral     SpO2 12/27/16 0936 100 %     Weight 12/27/16 0937 169 lb (76.7 kg)     Height 12/27/16 0937 5\' 1"  (1.549 m)     Head Circumference --      Peak Flow --      Pain Score 12/27/16 0936 0     Pain Loc --      Pain Edu? --      Excl. in [place]? --     Constitutional: Alert and oriented. Well appearing and in no acute distress.    Head: Normocephalic and atraumatic. Eyes: Conjunctivae are normal.  Cardiovascular: Normal rate, regular rhythm. Normal distal pulses. Respiratory: Normal respiratory effort.  Musculoskeletal: Nontender with normal range of motion in all extremities. Neurologic: Normal speech and language. No gross focal neurologic deficits are appreciated.  Skin:  Skin is warm, dry and intact. No rash noted. Take attachment along the right lower back. Mild small erythema around the attachment. Not embedded. Psychiatric: Mood and affect are normal.  ____________________________________________   LABS (all labs ordered are listed, but only abnormal results are displayed)  Labs Reviewed - No data to display ____________________________________________  EKG None ____________________________________________  RADIOLOGY None ____________________________________________   PROCEDURES  Procedure(s) performed: Tick removal with tweezers followed by cleansing of the skin and wound area. Tick successfully removed without complications.    Critical Care performed: no ____________________________________________   INITIAL IMPRESSION / ASSESSMENT  AND PLAN / ED COURSE  Pertinent labs & imaging results that were available during my care of the patient were reviewed by me and considered in my medical decision making (see chart for details).  Patient presented with tick attached along the right lower back. Tick successfully removed with tweezers without complications. Wound area cleansed. Patient will be given a prophylactic dose of doxycycline 200 mg. Patient and family advised to monitor the area for the next several days and advised to return to primary care or emergency department if symptoms worsen.  Patient / Family informed of clinical course, understand medical decision-making process, and agree with plan.  Patient was advised to follow up with PCP as needed and was also advised to return to the  emergency department for symptoms that change or worsen.      ____________________________________________   FINAL CLINICAL IMPRESSION(S) / ED DIAGNOSES  Final diagnoses:  Tick bite with subsequent removal of tick       NEW MEDICATIONS STARTED DURING THIS VISIT:  Discharge Medication List as of 12/27/2016 10:22 AM    START taking these medications   Details  doxycycline (VIBRAMYCIN) 100 MG capsule Take 1 capsule (100 mg total) by mouth 2 (two) times daily., Starting Sun 12/27/2016, Print         Note:  This document was prepared using Dragon voice recognition software and may include unintentional dictation errors.    Bayler Nehring, Laroy Apple, PA-C 12/27/16 1033    Kalen Ratajczak, Laroy Apple, PA-C 12/27/16 1131    Darel Hong, MD 12/27/16 1408

## 2016-12-27 NOTE — ED Notes (Signed)
Pt presents to ED with c/o tick to R lower back and wants it removed, pt is noted to have brown tick with white spot on R lower back at this time.

## 2016-12-27 NOTE — ED Triage Notes (Signed)
Patient presents to the ED with a tick on her lower right side of her back.  Patient states, "my daughter tried to take it out but it wouldn't come."  Tick is medium sized with a white spot.  Patient is in no obvious distress at this time.  States she noticed tick this am.

## 2016-12-27 NOTE — ED Notes (Signed)
NAD noted at time of D/C. Pt denies questions or concerns. Pt ambulatory to the lobby at this time.  

## 2017-01-04 ENCOUNTER — Other Ambulatory Visit: Payer: Self-pay | Admitting: Oncology

## 2017-01-04 NOTE — Progress Notes (Signed)
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  INTERVAL HISTORY: Patient returns to clinic today for further evaluation and consideration of cycle 34of neoadjuvant Taxotere, carboplatin, Herceptin, and Perjeta. She appears back to her baseline today. She continues to be anxious. She has no neurologic complaints. She denies any recent fevers or illnesses. She has a good appetite and denies weight loss. She has no chest pain or shortness of breath. She denies any nausea, vomiting, constipation, or diarrhea. She has no urinary complaints. Patient offers no further specific complaints today.  REVIEW OF SYSTEMS:   Review of Systems  Constitutional: Negative.  Negative for fever, malaise/fatigue and weight loss.  Respiratory: Negative.  Negative for cough and shortness of breath.   Cardiovascular: Negative.  Negative for chest pain and leg swelling.  Gastrointestinal: Negative.  Negative for abdominal pain.  Genitourinary: Negative.   Musculoskeletal: Negative.   Skin: Negative.  Negative for rash.  Neurological: Negative.  Negative for sensory change and weakness.  Psychiatric/Behavioral: Negative for depression. The patient is nervous/anxious.     As per HPI. Otherwise, a complete review of systems is negative.  PAST MEDICAL HISTORY: Past Medical History:  Diagnosis Date  . Breast cancer ([place])   . Breast mass, left 09/22/2016   RECOMMENDATION: Ultrasound-guided core biopsies of masses at left breast 2 o'clock 2 cm from nipple, left breast 2 o'clock 5 cm from nipple, abnormal left axillary lymph nodes.  Marland Kitchen GERD (gastroesophageal reflux disease)   . Hyperglycemia   . Hyperlipidemia   .  Obesity   . Postmenopausal     PAST SURGICAL HISTORY: Past Surgical History:  Procedure Laterality Date  . ABDOMINAL HYSTERECTOMY    . BREAST BIOPSY Left 09/30/2016   US biopsy of 3 areas, awaiting pathology  . ESOPHAGOGASTRODUODENOSCOPY (EGD) WITH PROPOFOL N/A 12/24/2014   Procedure: ESOPHAGOGASTRODUODENOSCOPY (EGD) WITH PROPOFOL;  Surgeon: Manya Silvas, MD;  Location: [facility] ENDOSCOPY;  Service: Endoscopy;  Laterality: N/A;  . PORTACATH PLACEMENT Right 10/27/2016   Procedure: INSERTION PORT-A-CATH;  Surgeon: Nestor Lewandowsky, MD;  Location: [facility] ORS;  Service: General;  Laterality: Right;  . TONSILECTOMY, ADENOIDECTOMY, BILATERAL MYRINGOTOMY AND TUBES      FAMILY HISTORY: Family History  Problem Relation Age of Onset  . Stroke Mother   . Stroke Father   . Cancer Neg Hx   . Depression Neg Hx     ADVANCED DIRECTIVES (Y/N):  N  HEALTH MAINTENANCE: Social History  Substance Use Topics  . Smoking status: Never Smoker  . Smokeless tobacco: Never Used  . Alcohol use No     Colonoscopy:  PAP:  Bone density:  Lipid panel:  Allergies  Allergen Reactions  . Latex Itching  . Penicillins Itching and Rash    Has patient had a PCN reaction causing immediate rash, facial/tongue/throat swelling, SOB or lightheadedness with hypotension: Yes Has patient had a PCN reaction causing severe rash involving mucus membranes or skin necrosis: No Has patient had a PCN reaction that required hospitalization No Has patient had a PCN reaction occurring within the last 10 years: No If all of the above answers are "NO", then may proceed with Cephalosporin use.     Current Outpatient Prescriptions  Medication Sig Dispense Refill  .  acetaminophen (TYLENOL) 500 MG tablet Take 1,000 mg by mouth every 6 (six) hours as needed for mild pain.    Marland Kitchen aspirin 81 MG tablet Take 81 mg by mouth daily.    Marland Kitchen atorvastatin (LIPITOR) 40 MG tablet Take 1 tablet (40 mg total) by mouth at bedtime. (Patient taking  differently: Take 40 mg by mouth 3 (three) times a week. In the evening  Depends on patient's preference if takes 20-40 mg) 30 tablet 1  . Cholecalciferol (VITAMIN D) 2000 units tablet Take 2,000 Units by mouth daily. One by mouth twice a week    . clindamycin (CLEOCIN) 300 MG capsule Take 1 capsule (300 mg total) by mouth 3 (three) times daily. 30 capsule 0  . doxycycline (VIBRAMYCIN) 100 MG capsule Take 1 capsule (100 mg total) by mouth 2 (two) times daily. 2 capsule 0  . ferrous sulfate 325 (65 FE) MG EC tablet Take 325 mg by mouth every other day.     . fluticasone (FLONASE) 50 MCG/ACT nasal spray Place 1 spray into both nostrils daily as needed for allergies or rhinitis.    Marland Kitchen lidocaine-prilocaine (EMLA) cream Apply to affected area once 30 g 3  . Multiple Vitamin (MULTIVITAMIN WITH MINERALS) TABS tablet Take 1 tablet by mouth every other day.     Marland Kitchen omeprazole (PRILOSEC) 20 MG capsule Take 20 mg by mouth daily as needed (heartburn).     . ondansetron (ZOFRAN) 8 MG tablet Take 1 tablet (8 mg total) by mouth 2 (two) times daily as needed for refractory nausea / vomiting. 60 tablet 2  . prochlorperazine (COMPAZINE) 10 MG tablet Take 1 tablet (10 mg total) by mouth every 6 (six) hours as needed (Nausea or vomiting). 60 tablet 2   No current facility-administered medications for this visit.     OBJECTIVE: Vitals:   01/05/17 1149  BP: (!) 151/78  Pulse: 78  Resp: 20  Temp: 98.5 F (36.9 C)     Body mass index is 31.88 kg/m.    ECOG FS:0 - Asymptomatic  General: Well-developed, well-nourished, no acute distress. Eyes: Pink conjunctiva, anicteric sclera. Breasts: Easily palpable left breast mass. Exam deferred today. Chest wall: Port site without erythema or induration. Lungs: Clear to auscultation bilaterally. Heart: Regular rate and rhythm. No rubs, murmurs, or gallops. Abdomen: Soft, nontender, nondistended. No organomegaly noted, normoactive bowel sounds. Musculoskeletal: No edema,  cyanosis, or clubbing. Neuro: Alert, answering all questions appropriately. Cranial nerves grossly intact. Skin: No rashes or petechiae noted. Psych: Normal affect.  LAB RESULTS:  Lab Results  Component Value Date   NA 143 01/05/2017   K 3.4 (L) 01/05/2017   CL 115 (H) 01/05/2017   CO2 22 01/05/2017   GLUCOSE 85 01/05/2017   BUN 16 01/05/2017   CREATININE 1.13 (H) 01/05/2017   CALCIUM 8.8 (L) 01/05/2017   PROT 6.6 01/05/2017   ALBUMIN 3.5 01/05/2017   AST 25 01/05/2017   ALT 21 01/05/2017   ALKPHOS 110 01/05/2017   BILITOT 0.4 01/05/2017   GFRNONAA 46 (L) 01/05/2017   GFRAA 53 (L) 01/05/2017    Lab Results  Component Value Date   WBC 5.7 01/05/2017   NEUTROABS 3.0 01/05/2017   HGB 9.3 (L) 01/05/2017   HCT 27.0 (L) 01/05/2017   MCV 91.4 01/05/2017   PLT 213 01/05/2017     STUDIES: No results found.  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  PLAN:  1. 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 Patient will then require surgery followed by adjuvant XRT. Patient will also receive Herceptin only every 3 weeks for an entire year after her surgery. An aromatase inhibitor would not offer benefit given the ER/PR negativity of her disease. Pretreatment MUGA scan on October 16, 2016 adequate to proceed with an EF of 65%. Repeat MUGA scan in June 2018. PET scan results reviewed independently with no obvious metastatic disease other than her known disease in breast and axilla. Patient will return to clinic tomorrow for cycle 4 of 6 of neoadjuvant treatment. Patient also receives Neulasta support.  Return to clinic in 3 weeks for further evaluation and consideration of cycle 5. 2. Anemia:  Secondary chemotherapy, monitor. 3. Leukocytosis: Resolved. Secondary to Neulasta. Monitor.  4. Renal insufficiency: Creatinine is only mildly elevated today, monitor. 5. Hypertension: Blood pressure mildly elevated, monitor. 6. Anxiety: Improved. Continue close follow-up with navigator.  Patient expressed understanding and was in agreement with this plan. She also understands that She can call clinic at any time with any questions, concerns, or complaints.   Cancer Staging Malignant neoplasm of upper-outer quadrant of left female breast [facility]) Staging form: Breast, AJCC 8th Edition - Clinical stage from 10/11/2016: Stage IIB (cT2, cN1, cM0, G3, ER: Negative, PR: Negative, HER2: Positive) - Signed by Lloyd Huger, MD on 10/11/2016   Lloyd Huger, MD   01/06/2017 8:42 AM

## 2017-01-05 ENCOUNTER — Inpatient Hospital Stay: Payer: Medicare Other

## 2017-01-05 ENCOUNTER — Inpatient Hospital Stay: Payer: Medicare Other | Attending: Oncology | Admitting: Oncology

## 2017-01-05 VITALS — BP 151/78 | HR 78 | Temp 98.5°F | Resp 20 | Wt 168.7 lb

## 2017-01-05 DIAGNOSIS — I10 Essential (primary) hypertension: Secondary | ICD-10-CM | POA: Diagnosis not present

## 2017-01-05 DIAGNOSIS — E785 Hyperlipidemia, unspecified: Secondary | ICD-10-CM | POA: Diagnosis not present

## 2017-01-05 DIAGNOSIS — Z5112 Encounter for antineoplastic immunotherapy: Secondary | ICD-10-CM | POA: Insufficient documentation

## 2017-01-05 DIAGNOSIS — C50412 Malignant neoplasm of upper-outer quadrant of left female breast: Secondary | ICD-10-CM | POA: Diagnosis not present

## 2017-01-05 DIAGNOSIS — K219 Gastro-esophageal reflux disease without esophagitis: Secondary | ICD-10-CM | POA: Insufficient documentation

## 2017-01-05 DIAGNOSIS — Z171 Estrogen receptor negative status [ER-]: Principal | ICD-10-CM

## 2017-01-05 DIAGNOSIS — D6481 Anemia due to antineoplastic chemotherapy: Secondary | ICD-10-CM | POA: Diagnosis not present

## 2017-01-05 DIAGNOSIS — Z5111 Encounter for antineoplastic chemotherapy: Secondary | ICD-10-CM | POA: Diagnosis not present

## 2017-01-05 DIAGNOSIS — Z79899 Other long term (current) drug therapy: Secondary | ICD-10-CM | POA: Diagnosis not present

## 2017-01-05 DIAGNOSIS — E669 Obesity, unspecified: Secondary | ICD-10-CM | POA: Diagnosis not present

## 2017-01-05 DIAGNOSIS — Z7982 Long term (current) use of aspirin: Secondary | ICD-10-CM | POA: Insufficient documentation

## 2017-01-05 DIAGNOSIS — Z7689 Persons encountering health services in other specified circumstances: Secondary | ICD-10-CM | POA: Diagnosis not present

## 2017-01-05 DIAGNOSIS — R739 Hyperglycemia, unspecified: Secondary | ICD-10-CM | POA: Diagnosis not present

## 2017-01-05 DIAGNOSIS — N289 Disorder of kidney and ureter, unspecified: Secondary | ICD-10-CM | POA: Diagnosis not present

## 2017-01-05 DIAGNOSIS — F419 Anxiety disorder, unspecified: Secondary | ICD-10-CM | POA: Insufficient documentation

## 2017-01-05 LAB — CBC WITH DIFFERENTIAL/PLATELET
Basophils Absolute: 0 10*3/uL (ref 0–0.1)
Basophils Relative: 1 %
Eosinophils Absolute: 0 10*3/uL (ref 0–0.7)
Eosinophils Relative: 1 %
HEMATOCRIT: 27 % — AB (ref 35.0–47.0)
HEMOGLOBIN: 9.3 g/dL — AB (ref 12.0–16.0)
LYMPHS ABS: 2.1 10*3/uL (ref 1.0–3.6)
Lymphocytes Relative: 36 %
MCH: 31.5 pg (ref 26.0–34.0)
MCHC: 34.4 g/dL (ref 32.0–36.0)
MCV: 91.4 fL (ref 80.0–100.0)
MONOS PCT: 10 %
Monocytes Absolute: 0.6 10*3/uL (ref 0.2–0.9)
NEUTROS ABS: 3 10*3/uL (ref 1.4–6.5)
NEUTROS PCT: 52 %
Platelets: 213 10*3/uL (ref 150–440)
RBC: 2.95 MIL/uL — ABNORMAL LOW (ref 3.80–5.20)
RDW: 18.7 % — ABNORMAL HIGH (ref 11.5–14.5)
WBC: 5.7 10*3/uL (ref 3.6–11.0)

## 2017-01-05 LAB — COMPREHENSIVE METABOLIC PANEL
ALK PHOS: 110 U/L (ref 38–126)
ALT: 21 U/L (ref 14–54)
ANION GAP: 6 (ref 5–15)
AST: 25 U/L (ref 15–41)
Albumin: 3.5 g/dL (ref 3.5–5.0)
BILIRUBIN TOTAL: 0.4 mg/dL (ref 0.3–1.2)
BUN: 16 mg/dL (ref 6–20)
CALCIUM: 8.8 mg/dL — AB (ref 8.9–10.3)
CO2: 22 mmol/L (ref 22–32)
Chloride: 115 mmol/L — ABNORMAL HIGH (ref 101–111)
Creatinine, Ser: 1.13 mg/dL — ABNORMAL HIGH (ref 0.44–1.00)
GFR calc non Af Amer: 46 mL/min — ABNORMAL LOW (ref >60)
GFR, EST AFRICAN AMERICAN: 53 mL/min — AB (ref >60)
Glucose, Bld: 85 mg/dL (ref 65–99)
Potassium: 3.4 mmol/L — ABNORMAL LOW (ref 3.5–5.1)
Sodium: 143 mmol/L (ref 135–145)
TOTAL PROTEIN: 6.6 g/dL (ref 6.5–8.1)

## 2017-01-05 NOTE — Progress Notes (Signed)
Patient denies any concerns today.  

## 2017-01-06 ENCOUNTER — Inpatient Hospital Stay: Payer: Medicare Other

## 2017-01-06 VITALS — BP 113/61 | HR 68 | Temp 98.7°F | Resp 18

## 2017-01-06 DIAGNOSIS — C50412 Malignant neoplasm of upper-outer quadrant of left female breast: Secondary | ICD-10-CM

## 2017-01-06 DIAGNOSIS — Z7689 Persons encountering health services in other specified circumstances: Secondary | ICD-10-CM | POA: Diagnosis not present

## 2017-01-06 DIAGNOSIS — Z5112 Encounter for antineoplastic immunotherapy: Secondary | ICD-10-CM | POA: Diagnosis not present

## 2017-01-06 DIAGNOSIS — R739 Hyperglycemia, unspecified: Secondary | ICD-10-CM | POA: Diagnosis not present

## 2017-01-06 DIAGNOSIS — I10 Essential (primary) hypertension: Secondary | ICD-10-CM | POA: Diagnosis not present

## 2017-01-06 DIAGNOSIS — D6481 Anemia due to antineoplastic chemotherapy: Secondary | ICD-10-CM | POA: Diagnosis not present

## 2017-01-06 DIAGNOSIS — N289 Disorder of kidney and ureter, unspecified: Secondary | ICD-10-CM | POA: Diagnosis not present

## 2017-01-06 DIAGNOSIS — K219 Gastro-esophageal reflux disease without esophagitis: Secondary | ICD-10-CM | POA: Diagnosis not present

## 2017-01-06 DIAGNOSIS — Z5111 Encounter for antineoplastic chemotherapy: Secondary | ICD-10-CM | POA: Diagnosis not present

## 2017-01-06 DIAGNOSIS — Z171 Estrogen receptor negative status [ER-]: Principal | ICD-10-CM

## 2017-01-06 DIAGNOSIS — Z79899 Other long term (current) drug therapy: Secondary | ICD-10-CM | POA: Diagnosis not present

## 2017-01-06 DIAGNOSIS — Z7982 Long term (current) use of aspirin: Secondary | ICD-10-CM | POA: Diagnosis not present

## 2017-01-06 DIAGNOSIS — E785 Hyperlipidemia, unspecified: Secondary | ICD-10-CM | POA: Diagnosis not present

## 2017-01-06 MED ORDER — SODIUM CHLORIDE 0.9 % IV SOLN
Freq: Once | INTRAVENOUS | Status: AC
  Administered 2017-01-06: 09:00:00 via INTRAVENOUS
  Filled 2017-01-06: qty 1000

## 2017-01-06 MED ORDER — HEPARIN SOD (PORK) LOCK FLUSH 100 UNIT/ML IV SOLN
500.0000 [IU] | Freq: Once | INTRAVENOUS | Status: AC | PRN
  Administered 2017-01-06: 500 [IU]
  Filled 2017-01-06: qty 5

## 2017-01-06 MED ORDER — PEGFILGRASTIM 6 MG/0.6ML ~~LOC~~ PSKT
6.0000 mg | PREFILLED_SYRINGE | Freq: Once | SUBCUTANEOUS | Status: AC
  Administered 2017-01-06: 6 mg via SUBCUTANEOUS
  Filled 2017-01-06: qty 0.6

## 2017-01-06 MED ORDER — SODIUM CHLORIDE 0.9 % IV SOLN
380.0000 mg | Freq: Once | INTRAVENOUS | Status: AC
  Administered 2017-01-06: 380 mg via INTRAVENOUS
  Filled 2017-01-06: qty 38

## 2017-01-06 MED ORDER — DEXAMETHASONE SODIUM PHOSPHATE 10 MG/ML IJ SOLN
10.0000 mg | Freq: Once | INTRAMUSCULAR | Status: AC
  Administered 2017-01-06: 10 mg via INTRAVENOUS
  Filled 2017-01-06: qty 1

## 2017-01-06 MED ORDER — SODIUM CHLORIDE 0.9 % IV SOLN
60.0000 mg/m2 | Freq: Once | INTRAVENOUS | Status: AC
  Administered 2017-01-06: 110 mg via INTRAVENOUS
  Filled 2017-01-06: qty 11

## 2017-01-06 MED ORDER — ACETAMINOPHEN 325 MG PO TABS
650.0000 mg | ORAL_TABLET | Freq: Once | ORAL | Status: AC
  Administered 2017-01-06: 650 mg via ORAL
  Filled 2017-01-06: qty 2

## 2017-01-06 MED ORDER — SODIUM CHLORIDE 0.9% FLUSH
10.0000 mL | INTRAVENOUS | Status: DC | PRN
  Administered 2017-01-06: 10 mL
  Filled 2017-01-06: qty 10

## 2017-01-06 MED ORDER — TRASTUZUMAB CHEMO 150 MG IV SOLR
450.0000 mg | Freq: Once | INTRAVENOUS | Status: AC
  Administered 2017-01-06: 450 mg via INTRAVENOUS
  Filled 2017-01-06: qty 21.43

## 2017-01-06 MED ORDER — DIPHENHYDRAMINE HCL 25 MG PO CAPS
25.0000 mg | ORAL_CAPSULE | Freq: Once | ORAL | Status: AC
  Administered 2017-01-06: 25 mg via ORAL
  Filled 2017-01-06: qty 1

## 2017-01-06 MED ORDER — SODIUM CHLORIDE 0.9 % IV SOLN
420.0000 mg | Freq: Once | INTRAVENOUS | Status: AC
  Administered 2017-01-06: 420 mg via INTRAVENOUS
  Filled 2017-01-06: qty 14

## 2017-01-06 MED ORDER — PALONOSETRON HCL INJECTION 0.25 MG/5ML
0.2500 mg | Freq: Once | INTRAVENOUS | Status: AC
  Administered 2017-01-06: 0.25 mg via INTRAVENOUS
  Filled 2017-01-06: qty 5

## 2017-01-07 ENCOUNTER — Encounter (INDEPENDENT_AMBULATORY_CARE_PROVIDER_SITE_OTHER): Payer: Medicare Other

## 2017-01-07 ENCOUNTER — Ambulatory Visit (INDEPENDENT_AMBULATORY_CARE_PROVIDER_SITE_OTHER): Payer: Medicare Other | Admitting: Vascular Surgery

## 2017-01-22 ENCOUNTER — Ambulatory Visit
Admission: RE | Admit: 2017-01-22 | Discharge: 2017-01-22 | Disposition: A | Discharge status: Home / Self Care | Payer: Medicare Other | Source: Ambulatory Visit | Attending: Oncology | Admitting: Oncology

## 2017-01-22 DIAGNOSIS — C50412 Malignant neoplasm of upper-outer quadrant of left female breast: Secondary | ICD-10-CM

## 2017-01-22 DIAGNOSIS — C50912 Malignant neoplasm of unspecified site of left female breast: Secondary | ICD-10-CM | POA: Diagnosis not present

## 2017-01-22 DIAGNOSIS — Z171 Estrogen receptor negative status [ER-]: Secondary | ICD-10-CM | POA: Diagnosis not present

## 2017-01-22 DIAGNOSIS — Z0189 Encounter for other specified special examinations: Secondary | ICD-10-CM | POA: Diagnosis not present

## 2017-01-22 MED ORDER — TECHNETIUM TC 99M-LABELED RED BLOOD CELLS IV KIT
20.0000 | PACK | Freq: Once | INTRAVENOUS | Status: AC | PRN
  Administered 2017-01-22: 21.67 via INTRAVENOUS

## 2017-01-22 NOTE — Progress Notes (Signed)
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  INTERVAL HISTORY: Patient returns to clinic today for further evaluation and consideration of cycle 5 of neoadjuvant Taxotere, carboplatin, Herceptin, and Perjeta. She currently feels well and is asymptomatic. She continues to be anxious. She has no neurologic complaints. She denies any recent fevers or illnesses. She has a good appetite and denies weight loss. She has no chest pain or shortness of breath. She denies any nausea, vomiting, constipation, or diarrhea. She has no urinary complaints. Patient offers no further specific complaints today.  REVIEW OF SYSTEMS:   Review of Systems  Constitutional: Negative.  Negative for fever, malaise/fatigue and weight loss.  Respiratory: Negative.  Negative for cough and shortness of breath.   Cardiovascular: Negative.  Negative for chest pain and leg swelling.  Gastrointestinal: Negative.  Negative for abdominal pain.  Genitourinary: Negative.   Musculoskeletal: Negative.   Skin: Negative.  Negative for rash.  Neurological: Negative.  Negative for sensory change and weakness.  Psychiatric/Behavioral: Negative for depression. The patient is nervous/anxious.     As per HPI. Otherwise, a complete review of systems is negative.  PAST MEDICAL HISTORY: Past Medical History:  Diagnosis Date  . Breast cancer ([place])   . Breast mass, left 09/22/2016   RECOMMENDATION: Ultrasound-guided core biopsies of masses at left breast 2 o'clock 2 cm from nipple, left breast 2 o'clock 5 cm from nipple, abnormal left axillary lymph nodes.  Marland Kitchen GERD (gastroesophageal reflux disease)   . Hyperglycemia   . Hyperlipidemia    . Obesity   . Postmenopausal     PAST SURGICAL HISTORY: Past Surgical History:  Procedure Laterality Date  . ABDOMINAL HYSTERECTOMY    . BREAST BIOPSY Left 09/30/2016   US biopsy of 3 areas, awaiting pathology  . ESOPHAGOGASTRODUODENOSCOPY (EGD) WITH PROPOFOL N/A 12/24/2014   Procedure: ESOPHAGOGASTRODUODENOSCOPY (EGD) WITH PROPOFOL;  Surgeon: Manya Silvas, MD;  Location: [facility] ENDOSCOPY;  Service: Endoscopy;  Laterality: N/A;  . PORTACATH PLACEMENT Right 10/27/2016   Procedure: INSERTION PORT-A-CATH;  Surgeon: Nestor Lewandowsky, MD;  Location: [facility] ORS;  Service: General;  Laterality: Right;  . TONSILECTOMY, ADENOIDECTOMY, BILATERAL MYRINGOTOMY AND TUBES      FAMILY HISTORY: Family History  Problem Relation Age of Onset  . Stroke Mother   . Stroke Father   . Cancer Neg Hx   . Depression Neg Hx     ADVANCED DIRECTIVES (Y/N):  N  HEALTH MAINTENANCE: Social History  Substance Use Topics  . Smoking status: Never Smoker  . Smokeless tobacco: Never Used  . Alcohol use No     Colonoscopy:  PAP:  Bone density:  Lipid panel:  Allergies  Allergen Reactions  . Latex Itching  . Penicillins Itching and Rash    Has patient had a PCN reaction causing immediate rash, facial/tongue/throat swelling, SOB or lightheadedness with hypotension: Yes Has patient had a PCN reaction causing severe rash involving mucus membranes or skin necrosis: No Has patient had a PCN reaction that required hospitalization No Has patient had a PCN reaction occurring within the last 10 years: No If all of the above answers are "NO", then may proceed with Cephalosporin use.     Current Outpatient Prescriptions  Medication Sig Dispense Refill  .  acetaminophen (TYLENOL) 500 MG tablet Take 1,000 mg by mouth every 6 (six) hours as needed for mild pain.    Marland Kitchen aspirin 81 MG tablet Take 81 mg by mouth daily.    Marland Kitchen atorvastatin (LIPITOR) 40 MG tablet Take 1 tablet (40 mg total) by mouth at bedtime. (Patient taking  differently: Take 40 mg by mouth 3 (three) times a week. In the evening  Depends on patient's preference if takes 20-40 mg) 30 tablet 1  . Cholecalciferol (VITAMIN D) 2000 units tablet Take 2,000 Units by mouth daily. One by mouth twice a week    . clindamycin (CLEOCIN) 300 MG capsule Take 1 capsule (300 mg total) by mouth 3 (three) times daily. 30 capsule 0  . ferrous sulfate 325 (65 FE) MG EC tablet Take 325 mg by mouth every other day.     . fluticasone (FLONASE) 50 MCG/ACT nasal spray Place 1 spray into both nostrils daily as needed for allergies or rhinitis.    Marland Kitchen lidocaine-prilocaine (EMLA) cream Apply to affected area once 30 g 3  . Multiple Vitamin (MULTIVITAMIN WITH MINERALS) TABS tablet Take 1 tablet by mouth every other day.     Marland Kitchen omeprazole (PRILOSEC) 20 MG capsule Take 20 mg by mouth daily as needed (heartburn).     . ondansetron (ZOFRAN) 8 MG tablet Take 1 tablet (8 mg total) by mouth 2 (two) times daily as needed for refractory nausea / vomiting. 60 tablet 2  . prochlorperazine (COMPAZINE) 10 MG tablet Take 1 tablet (10 mg total) by mouth every 6 (six) hours as needed (Nausea or vomiting). 60 tablet 2  . doxycycline (VIBRAMYCIN) 100 MG capsule Take 1 capsule (100 mg total) by mouth 2 (two) times daily. (Patient not taking: Reported on 01/26/2017) 2 capsule 0   No current facility-administered medications for this visit.    Facility-Administered Medications Ordered in Other Visits  Medication Dose Route Frequency Provider Last Rate Last Dose  . CARBOplatin (PARAPLATIN) 350 mg in sodium chloride 0.9 % 250 mL chemo infusion  350 mg Intravenous Once Lloyd Huger, MD      . DOCEtaxel (TAXOTERE) 110 mg in sodium chloride 0.9 % 250 mL chemo infusion  60 mg/m2 (Treatment Plan Recorded) Intravenous Once Lloyd Huger, MD 261 mL/hr at 01/27/17 1215 110 mg at 01/27/17 1215  . pegfilgrastim (NEULASTA ONPRO KIT) injection 6 mg  6 mg Subcutaneous Once Lloyd Huger, MD         OBJECTIVE: Vitals:   01/26/17 1208  BP: 115/67  Resp: 18  Temp: 98.1 F (36.7 C)     Body mass index is 31.03 kg/m.    ECOG FS:0 - Asymptomatic  General: Well-developed, well-nourished, no acute distress. Eyes: Pink conjunctiva, anicteric sclera. Breasts: Easily palpable left breast mass. Exam deferred today. Chest wall: Port site without erythema or induration. Lungs: Clear to auscultation bilaterally. Heart: Regular rate and rhythm. No rubs, murmurs, or gallops. Abdomen: Soft, nontender, nondistended. No organomegaly noted, normoactive bowel sounds. Musculoskeletal: No edema, cyanosis, or clubbing. Neuro: Alert, answering all questions appropriately. Cranial nerves grossly intact. Skin: No rashes or petechiae noted. Psych: Normal affect.  LAB RESULTS:  Lab Results  Component Value Date   NA 143 01/26/2017   K 3.2 (L) 01/26/2017   CL 113 (H) 01/26/2017   CO2 23 01/26/2017   GLUCOSE 84 01/26/2017   BUN 22 (H) 01/26/2017   CREATININE 1.29 (H) 01/26/2017   CALCIUM 8.8 (L) 01/26/2017   PROT 6.3 (L) 01/26/2017  ALBUMIN 3.4 (L) 01/26/2017   AST 25 01/26/2017   ALT 24 01/26/2017   ALKPHOS 105 01/26/2017   BILITOT 0.4 01/26/2017   GFRNONAA 39 (L) 01/26/2017   GFRAA 45 (L) 01/26/2017    Lab Results  Component Value Date   WBC 6.2 01/26/2017   NEUTROABS 3.8 01/26/2017   HGB 9.2 (L) 01/26/2017   HCT 26.5 (L) 01/26/2017   MCV 93.2 01/26/2017   PLT 157 01/26/2017     STUDIES: Nm Cardiac Muga Rest  Result Date: 01/22/2017 CLINICAL DATA:  LEFT breast cancer, cardiotoxic chemotherapy EXAM: NUCLEAR MEDICINE CARDIAC BLOOD POOL IMAGING (MUGA) TECHNIQUE: Cardiac multi-gated acquisition was performed at rest following intravenous injection of Tc-5mlabeled red blood cells. RADIOPHARMACEUTICALS:  21.67 mCi Tc-958mertechnetate in-vitro labeled autologous red blood cells IV COMPARISON:  10/16/2016 FINDINGS: Calculated LEFT ventricular ejection fraction is 55%, within the  normal range but decreased from the 65% on the previous exam. Study was obtained at a heart rate of 72 beats per minute. Patient was rhythmic during imaging. Cine analysis of the LEFT ventricle in 3 projections demonstrates no focal wall motion abnormalities. IMPRESSION: Normal LEFT ventricular ejection fraction of 55% decreased from the 65% on the previous exam. Normal LV wall motion. Electronically Signed   By: MaLavonia Dana.D.   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  PLAN:    1. 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 Patient will then require surgery followed by adjuvant XRT. Patient will also receive Herceptin only every 3 weeks for an entire year after her surgery. An aromatase inhibitor would not offer benefit given the ER/PR negativity of her disease. Repeat MUGA scan on January 22, 2017 revealed an EF of 55% which was 10% less than previous. Continue to monitor closely. Repeat in October 2018. PET scan results reviewed independently with no obvious metastatic disease other than her known disease in breast and axilla. Patient will return to clinic tomorrow for cycle 5 of 6 of neoadjuvant treatment. Patient also receives Neulasta support. Patient will be given a referral back to surgery at the conclusion of cycle 6, but she is already indicated that she likely refuse any surgical intervention.  Return to clinic in 3 weeks for further evaluation and consideration of cycle 6. 2. Anemia: Secondary chemotherapy, monitor. 3. Leukocytosis: Resolved. Secondary to Neulasta. Monitor.  4. Renal insufficiency: Creatinine is only mildly elevated today, monitor. 5. Hypertension: Blood pressure  mildly elevated, monitor. 6. Anxiety: Improved. Continue close follow-up with navigator.  Patient expressed understanding and was in agreement with this plan. She also understands that She can call clinic at any time with any questions, concerns, or complaints.   Cancer Staging Malignant ne[facility] of upper-outer quadrant of left female breast [facility] Breast, AJCC 8th Edition - Clinical stage from 10/11/2016: Stage IIB (cT2, cN1, cM0, G3, ER: Negative, PR: Negative, HER2: Positive) - Signed by FiLloyd HugerMD on 10/11/2016   TiLloyd HugerMD   01/27/2017 1:00 PM

## 2017-01-26 ENCOUNTER — Inpatient Hospital Stay: Payer: Medicare Other | Attending: Oncology

## 2017-01-26 ENCOUNTER — Inpatient Hospital Stay (HOSPITAL_BASED_OUTPATIENT_CLINIC_OR_DEPARTMENT_OTHER): Payer: Medicare Other | Admitting: Oncology

## 2017-01-26 VITALS — BP 115/67 | Temp 98.1°F | Resp 18 | Wt 164.2 lb

## 2017-01-26 DIAGNOSIS — D6481 Anemia due to antineoplastic chemotherapy: Secondary | ICD-10-CM | POA: Insufficient documentation

## 2017-01-26 DIAGNOSIS — K219 Gastro-esophageal reflux disease without esophagitis: Secondary | ICD-10-CM | POA: Diagnosis not present

## 2017-01-26 DIAGNOSIS — C50412 Malignant neoplasm of upper-outer quadrant of left female breast: Secondary | ICD-10-CM

## 2017-01-26 DIAGNOSIS — Z171 Estrogen receptor negative status [ER-]: Secondary | ICD-10-CM

## 2017-01-26 DIAGNOSIS — R739 Hyperglycemia, unspecified: Secondary | ICD-10-CM | POA: Insufficient documentation

## 2017-01-26 DIAGNOSIS — E785 Hyperlipidemia, unspecified: Secondary | ICD-10-CM | POA: Insufficient documentation

## 2017-01-26 DIAGNOSIS — I10 Essential (primary) hypertension: Secondary | ICD-10-CM

## 2017-01-26 DIAGNOSIS — E669 Obesity, unspecified: Secondary | ICD-10-CM | POA: Insufficient documentation

## 2017-01-26 DIAGNOSIS — Z79899 Other long term (current) drug therapy: Secondary | ICD-10-CM

## 2017-01-26 DIAGNOSIS — Z7982 Long term (current) use of aspirin: Secondary | ICD-10-CM

## 2017-01-26 DIAGNOSIS — E876 Hypokalemia: Secondary | ICD-10-CM | POA: Insufficient documentation

## 2017-01-26 DIAGNOSIS — Z5111 Encounter for antineoplastic chemotherapy: Secondary | ICD-10-CM | POA: Diagnosis not present

## 2017-01-26 DIAGNOSIS — N2889 Other specified disorders of kidney and ureter: Secondary | ICD-10-CM | POA: Insufficient documentation

## 2017-01-26 DIAGNOSIS — F419 Anxiety disorder, unspecified: Secondary | ICD-10-CM | POA: Diagnosis not present

## 2017-01-26 LAB — CBC WITH DIFFERENTIAL/PLATELET
BASOS ABS: 0 10*3/uL (ref 0–0.1)
BASOS PCT: 1 %
EOS ABS: 0 10*3/uL (ref 0–0.7)
EOS PCT: 0 %
HEMATOCRIT: 26.5 % — AB (ref 35.0–47.0)
Hemoglobin: 9.2 g/dL — ABNORMAL LOW (ref 12.0–16.0)
Lymphocytes Relative: 29 %
Lymphs Abs: 1.8 10*3/uL (ref 1.0–3.6)
MCH: 32.3 pg (ref 26.0–34.0)
MCHC: 34.7 g/dL (ref 32.0–36.0)
MCV: 93.2 fL (ref 80.0–100.0)
MONO ABS: 0.6 10*3/uL (ref 0.2–0.9)
MONOS PCT: 9 %
NEUTROS ABS: 3.8 10*3/uL (ref 1.4–6.5)
Neutrophils Relative %: 61 %
PLATELETS: 157 10*3/uL (ref 150–440)
RBC: 2.84 MIL/uL — ABNORMAL LOW (ref 3.80–5.20)
RDW: 18.8 % — AB (ref 11.5–14.5)
WBC: 6.2 10*3/uL (ref 3.6–11.0)

## 2017-01-26 LAB — COMPREHENSIVE METABOLIC PANEL
ALBUMIN: 3.4 g/dL — AB (ref 3.5–5.0)
ALT: 24 U/L (ref 14–54)
ANION GAP: 7 (ref 5–15)
AST: 25 U/L (ref 15–41)
Alkaline Phosphatase: 105 U/L (ref 38–126)
BUN: 22 mg/dL — AB (ref 6–20)
CHLORIDE: 113 mmol/L — AB (ref 101–111)
CO2: 23 mmol/L (ref 22–32)
Calcium: 8.8 mg/dL — ABNORMAL LOW (ref 8.9–10.3)
Creatinine, Ser: 1.29 mg/dL — ABNORMAL HIGH (ref 0.44–1.00)
GFR calc Af Amer: 45 mL/min — ABNORMAL LOW (ref >60)
GFR calc non Af Amer: 39 mL/min — ABNORMAL LOW (ref >60)
GLUCOSE: 84 mg/dL (ref 65–99)
POTASSIUM: 3.2 mmol/L — AB (ref 3.5–5.1)
SODIUM: 143 mmol/L (ref 135–145)
TOTAL PROTEIN: 6.3 g/dL — AB (ref 6.5–8.1)
Total Bilirubin: 0.4 mg/dL (ref 0.3–1.2)

## 2017-01-26 NOTE — Progress Notes (Signed)
Patient is here today for follow up she mentions rapid heart rate and night while trying to sleep, she also wants to have dental work and see about getting a colonoscopy

## 2017-01-27 ENCOUNTER — Inpatient Hospital Stay: Payer: Medicare Other

## 2017-01-27 VITALS — BP 118/67 | HR 75 | Temp 97.3°F | Resp 18

## 2017-01-27 DIAGNOSIS — K219 Gastro-esophageal reflux disease without esophagitis: Secondary | ICD-10-CM | POA: Diagnosis not present

## 2017-01-27 DIAGNOSIS — C50412 Malignant neoplasm of upper-outer quadrant of left female breast: Secondary | ICD-10-CM | POA: Diagnosis not present

## 2017-01-27 DIAGNOSIS — Z79899 Other long term (current) drug therapy: Secondary | ICD-10-CM | POA: Diagnosis not present

## 2017-01-27 DIAGNOSIS — E876 Hypokalemia: Secondary | ICD-10-CM | POA: Diagnosis not present

## 2017-01-27 DIAGNOSIS — R739 Hyperglycemia, unspecified: Secondary | ICD-10-CM | POA: Diagnosis not present

## 2017-01-27 DIAGNOSIS — D6481 Anemia due to antineoplastic chemotherapy: Secondary | ICD-10-CM | POA: Diagnosis not present

## 2017-01-27 DIAGNOSIS — Z7982 Long term (current) use of aspirin: Secondary | ICD-10-CM | POA: Diagnosis not present

## 2017-01-27 DIAGNOSIS — Z171 Estrogen receptor negative status [ER-]: Secondary | ICD-10-CM | POA: Diagnosis not present

## 2017-01-27 DIAGNOSIS — N2889 Other specified disorders of kidney and ureter: Secondary | ICD-10-CM | POA: Diagnosis not present

## 2017-01-27 DIAGNOSIS — E785 Hyperlipidemia, unspecified: Secondary | ICD-10-CM | POA: Diagnosis not present

## 2017-01-27 DIAGNOSIS — I10 Essential (primary) hypertension: Secondary | ICD-10-CM | POA: Diagnosis not present

## 2017-01-27 DIAGNOSIS — Z5111 Encounter for antineoplastic chemotherapy: Secondary | ICD-10-CM | POA: Diagnosis not present

## 2017-01-27 MED ORDER — SODIUM CHLORIDE 0.9 % IV SOLN
351.5000 mg | Freq: Once | INTRAVENOUS | Status: AC
  Administered 2017-01-27: 350 mg via INTRAVENOUS
  Filled 2017-01-27: qty 35

## 2017-01-27 MED ORDER — SODIUM CHLORIDE 0.9 % IV SOLN
60.0000 mg/m2 | Freq: Once | INTRAVENOUS | Status: AC
  Administered 2017-01-27: 110 mg via INTRAVENOUS
  Filled 2017-01-27: qty 11

## 2017-01-27 MED ORDER — DEXAMETHASONE SODIUM PHOSPHATE 10 MG/ML IJ SOLN
10.0000 mg | Freq: Once | INTRAMUSCULAR | Status: AC
  Administered 2017-01-27: 10 mg via INTRAVENOUS

## 2017-01-27 MED ORDER — PALONOSETRON HCL INJECTION 0.25 MG/5ML
0.2500 mg | Freq: Once | INTRAVENOUS | Status: AC
  Administered 2017-01-27: 0.25 mg via INTRAVENOUS

## 2017-01-27 MED ORDER — DIPHENHYDRAMINE HCL 25 MG PO CAPS
25.0000 mg | ORAL_CAPSULE | Freq: Once | ORAL | Status: AC
  Administered 2017-01-27: 25 mg via ORAL

## 2017-01-27 MED ORDER — PEGFILGRASTIM 6 MG/0.6ML ~~LOC~~ PSKT
6.0000 mg | PREFILLED_SYRINGE | Freq: Once | SUBCUTANEOUS | Status: AC
  Administered 2017-01-27: 6 mg via SUBCUTANEOUS
  Filled 2017-01-27: qty 0.6

## 2017-01-27 MED ORDER — SODIUM CHLORIDE 0.9% FLUSH
10.0000 mL | INTRAVENOUS | Status: DC | PRN
Start: 2017-01-27 — End: 2017-01-27
  Administered 2017-01-27: 10 mL via INTRAVENOUS
  Filled 2017-01-27: qty 10

## 2017-01-27 MED ORDER — SODIUM CHLORIDE 0.9 % IV SOLN
420.0000 mg | Freq: Once | INTRAVENOUS | Status: AC
  Administered 2017-01-27: 420 mg via INTRAVENOUS
  Filled 2017-01-27: qty 14

## 2017-01-27 MED ORDER — ACETAMINOPHEN 325 MG PO TABS
650.0000 mg | ORAL_TABLET | Freq: Once | ORAL | Status: AC
Start: 2017-01-27 — End: 2017-01-27
  Administered 2017-01-27: 650 mg via ORAL

## 2017-01-27 MED ORDER — HEPARIN SOD (PORK) LOCK FLUSH 100 UNIT/ML IV SOLN
500.0000 [IU] | Freq: Once | INTRAVENOUS | Status: AC
  Administered 2017-01-27: 500 [IU] via INTRAVENOUS

## 2017-01-27 MED ORDER — SODIUM CHLORIDE 0.9 % IV SOLN
Freq: Once | INTRAVENOUS | Status: AC
  Administered 2017-01-27: 10:00:00 via INTRAVENOUS
  Filled 2017-01-27: qty 1000

## 2017-01-27 MED ORDER — SODIUM CHLORIDE 0.9 % IV SOLN
450.0000 mg | Freq: Once | INTRAVENOUS | Status: AC
  Administered 2017-01-27: 450 mg via INTRAVENOUS
  Filled 2017-01-27: qty 21.43

## 2017-02-01 ENCOUNTER — Ambulatory Visit (INDEPENDENT_AMBULATORY_CARE_PROVIDER_SITE_OTHER): Payer: Medicare Other | Admitting: Family Medicine

## 2017-02-01 ENCOUNTER — Encounter: Payer: Self-pay | Admitting: Family Medicine

## 2017-02-01 ENCOUNTER — Other Ambulatory Visit: Payer: Self-pay | Admitting: Oncology

## 2017-02-01 VITALS — BP 116/68 | HR 93 | Temp 98.2°F | Resp 14 | Wt 162.3 lb

## 2017-02-01 DIAGNOSIS — K219 Gastro-esophageal reflux disease without esophagitis: Secondary | ICD-10-CM

## 2017-02-01 DIAGNOSIS — E785 Hyperlipidemia, unspecified: Secondary | ICD-10-CM

## 2017-02-01 DIAGNOSIS — I10 Essential (primary) hypertension: Secondary | ICD-10-CM | POA: Diagnosis not present

## 2017-02-01 DIAGNOSIS — R000 Tachycardia, unspecified: Secondary | ICD-10-CM | POA: Insufficient documentation

## 2017-02-01 DIAGNOSIS — D649 Anemia, unspecified: Secondary | ICD-10-CM | POA: Insufficient documentation

## 2017-02-01 DIAGNOSIS — C50412 Malignant neoplasm of upper-outer quadrant of left female breast: Secondary | ICD-10-CM

## 2017-02-01 DIAGNOSIS — Z171 Estrogen receptor negative status [ER-]: Secondary | ICD-10-CM

## 2017-02-01 DIAGNOSIS — E876 Hypokalemia: Secondary | ICD-10-CM

## 2017-02-01 LAB — LIPID PANEL
CHOLESTEROL: 200 mg/dL — AB (ref <200)
HDL: 54 mg/dL (ref >50)
LDL Cholesterol: 127 mg/dL — ABNORMAL HIGH (ref <100)
TRIGLYCERIDES: 96 mg/dL (ref <150)
Total CHOL/HDL Ratio: 3.7 Ratio (ref <5.0)
VLDL: 19 mg/dL (ref <30)

## 2017-02-01 LAB — BASIC METABOLIC PANEL
BUN: 18 mg/dL (ref 7–25)
CHLORIDE: 110 mmol/L (ref 98–110)
CO2: 25 mmol/L (ref 20–31)
CREATININE: 1.15 mg/dL — AB (ref 0.60–0.93)
Calcium: 8.8 mg/dL (ref 8.6–10.4)
Glucose, Bld: 77 mg/dL (ref 65–99)
POTASSIUM: 4 mmol/L (ref 3.5–5.3)
Sodium: 143 mmol/L (ref 135–146)

## 2017-02-01 LAB — T4, FREE: Free T4: 1 ng/dL (ref 0.8–1.8)

## 2017-02-01 LAB — TSH: TSH: 1.03 m[IU]/L

## 2017-02-01 NOTE — Assessment & Plan Note (Signed)
Excellent control.   

## 2017-02-01 NOTE — Assessment & Plan Note (Signed)
Start back on PPI daily for two weeks

## 2017-02-01 NOTE — Assessment & Plan Note (Addendum)
Thinks it's related to her treatments; will send note to oncologist too, and put her on Holter monitor if K+, Mg2+, and thyroid function are normal; check -lytes, thyroid function

## 2017-02-01 NOTE — Assessment & Plan Note (Signed)
Noted on labs drawn by oncologist; patient thinks she is to wait on colonoscopy until after treatments are over; will send note to confirm

## 2017-02-01 NOTE — Progress Notes (Signed)
BP 116/68   Pulse 93   Temp 98.2 F (36.8 C) (Oral)   Resp 14   Wt 162 lb 4.8 oz (73.6 kg)   SpO2 99%   BMI 30.67 kg/m    Subjective:    Patient ID: Maria Patterson, female    DOB: [birth year], 76 y.o.   MRN: [id number]  HPI: Maria Patterson is a 76 y.o. female  Chief Complaint  Patient presents with  . Follow-up    HPI Patient is here for follow-up  High cholesterol; taking statin; taking 40 mg three days a week or even less; makes her hurt  Her heart has been flying lately; she does not have chest pain, but it's beating fast all night long; she might sleep 10 minutes and then it wakes her from sleep; she went to the imaging center and they said her heart is okay, had the MUGA scan with Dr. Grayland Ormond; symptoms are worse after her treatments, then ease up before next treatment due  Hyperglycemia; last A1c in Feb 2018, 5.3; not time to recheck; does like Coke and juice; Cokes settles her stomach; throat doesn't feel right; food doesn't taste right; drinks don't taste right; not sure if treatment related; food doesn't get stuck; not true heartburn, more of a taste; ornery taste; taking PPI  Breast cancer; managed by oncologist; little sharp pain at times; not too often; had MUGA scan; no fam hx  Just had labs done on July 10, potassium was low at 3.2 H/H 9.2/26.5 on oncology labs; he said wait on the colonoscopy until after breast cancer treated  Depression screen [facility] 2/9 02/01/2017 11/06/2016 09/18/2016 09/01/2016 03/09/2016  Decreased Interest 0 0 0 0 0  Down, Depressed, Hopeless 1 0 0 0 1  PHQ - 2 Score 1 0 0 0 1    Relevant past medical, surgical, family and social history reviewed Past Medical History:  Diagnosis Date  . Breast cancer ([place])   . Breast mass, left 09/22/2016   RECOMMENDATION: Ultrasound-guided core biopsies of masses at left breast 2 o'clock 2 cm from nipple, left breast 2 o'clock 5 cm from nipple, abnormal left axillary lymph nodes.  Marland Kitchen GERD (gastroesophageal  reflux disease)   . Hyperglycemia   . Hyperlipidemia   . Obesity   . Postmenopausal    Past Surgical History:  Procedure Laterality Date  . ABDOMINAL HYSTERECTOMY    . BREAST BIOPSY Left 09/30/2016   US biopsy of 3 areas, awaiting pathology  . ESOPHAGOGASTRODUODENOSCOPY (EGD) WITH PROPOFOL N/A 12/24/2014   Procedure: ESOPHAGOGASTRODUODENOSCOPY (EGD) WITH PROPOFOL;  Surgeon: Manya Silvas, MD;  Location: [facility] ENDOSCOPY;  Service: Endoscopy;  Laterality: N/A;  . PORTACATH PLACEMENT Right 10/27/2016   Procedure: INSERTION PORT-A-CATH;  Surgeon: Nestor Lewandowsky, MD;  Location: [facility] ORS;  Service: General;  Laterality: Right;  . TONSILECTOMY, ADENOIDECTOMY, BILATERAL MYRINGOTOMY AND TUBES     Family History  Problem Relation Age of Onset  . Stroke Mother   . Stroke Father   . Cancer Neg Hx   . Depression Neg Hx    Social History   Social History  . Marital status: Widowed    Spouse name: N/A  . Number of children: N/A  . Years of education: N/A   Occupational History  . Not on file.   Social History Main Topics  . Smoking status: Never Smoker  . Smokeless tobacco: Never Used  . Alcohol use No  . Drug use: No  . Sexual activity: Not Currently   Other  Topics Concern  . Not on file   Social History Narrative  . No narrative on file    Interim medical history since last visit reviewed. Allergies and medications reviewed  Review of Systems Per HPI unless specifically indicated above     Objective:    BP 116/68   Pulse 93   Temp 98.2 F (36.8 C) (Oral)   Resp 14   Wt 162 lb 4.8 oz (73.6 kg)   SpO2 99%   BMI 30.67 kg/m   Wt Readings from Last 3 Encounters:  02/01/17 162 lb 4.8 oz (73.6 kg)  01/26/17 164 lb 3.2 oz (74.5 kg)  01/05/17 168 lb 11.2 oz (76.5 kg)    Physical Exam  Constitutional: She appears well-developed and well-nourished. No distress.  HENT:  Head: Normocephalic and atraumatic.  Eyes: EOM are normal. No scleral icterus.  Neck: No  thyromegaly present.  Cardiovascular: Normal rate, regular rhythm and normal heart sounds.   No extrasystoles are present. Exam reveals no gallop.   No murmur heard. Pulmonary/Chest: Effort normal and breath sounds normal. No respiratory distress. She has no wheezes.  Abdominal: Soft. Bowel sounds are normal. She exhibits no distension.  Musculoskeletal: Normal range of motion. She exhibits no edema.  Neurological: She is alert. She exhibits normal muscle tone.  Skin: Skin is warm and dry. She is not diaphoretic. No pallor.     Psychiatric: She has a normal mood and affect. Her behavior is normal. Judgment and thought content normal. Her mood appears not anxious. She does not exhibit a depressed mood.    Results for orders placed or performed in visit on 01/26/17  CBC with Differential  Result Value Ref Range   WBC 6.2 3.6 - 11.0 K/uL   RBC 2.84 (L) 3.80 - 5.20 MIL/uL   Hemoglobin 9.2 (L) 12.0 - 16.0 g/dL   HCT 26.5 (L) 35.0 - 47.0 %   MCV 93.2 80.0 - 100.0 fL   MCH 32.3 26.0 - 34.0 pg   MCHC 34.7 32.0 - 36.0 g/dL   RDW 18.8 (H) 11.5 - 14.5 %   Platelets 157 150 - 440 K/uL   Neutrophils Relative % 61 %   Neutro Abs 3.8 1.4 - 6.5 K/uL   Lymphocytes Relative 29 %   Lymphs Abs 1.8 1.0 - 3.6 K/uL   Monocytes Relative 9 %   Monocytes Absolute 0.6 0.2 - 0.9 K/uL   Eosinophils Relative 0 %   Eosinophils Absolute 0.0 0 - 0.7 K/uL   Basophils Relative 1 %   Basophils Absolute 0.0 0 - 0.1 K/uL  Comprehensive metabolic panel  Result Value Ref Range   Sodium 143 135 - 145 mmol/L   Potassium 3.2 (L) 3.5 - 5.1 mmol/L   Chloride 113 (H) 101 - 111 mmol/L   CO2 23 22 - 32 mmol/L   Glucose, Bld 84 65 - 99 mg/dL   BUN 22 (H) 6 - 20 mg/dL   Creatinine, Ser 1.29 (H) 0.44 - 1.00 mg/dL   Calcium 8.8 (L) 8.9 - 10.3 mg/dL   Total Protein 6.3 (L) 6.5 - 8.1 g/dL   Albumin 3.4 (L) 3.5 - 5.0 g/dL   AST 25 15 - 41 U/L   ALT 24 14 - 54 U/L   Alkaline Phosphatase 105 38 - 126 U/L   Total Bilirubin  0.4 0.3 - 1.2 mg/dL   GFR calc non Af Amer 39 (L) >60 mL/min   GFR calc Af Amer 45 (L) >60 mL/min  Anion gap 7 5 - 15      Assessment & Plan:   Problem List Items Addressed This Visit      Cardiovascular and Mediastinum   Essential hypertension, benign    Excellent control        Digestive   GERD without esophagitis    Start back on PPI daily for two weeks        Other   Tachycardia    Thinks it's related to her treatments; will send note to oncologist too, and put her on Holter monitor if K+, Mg2+, and thyroid function are normal; check -lytes, thyroid function      Relevant Orders   Basic Metabolic Panel (BMET)   Magnesium   T4, free   TSH   Malignant neoplasm of upper-outer quadrant of left female breast ([place])    Managed by Dr. Grayland Ormond      Hyperlipidemia LDL goal <100    Stop the statin for now and see if symptoms resolve; stop for two weeks and then call me to see if we adjust dose or resume; try to avoid saturated fats      Relevant Orders   Lipid panel   Anemia    Noted on labs drawn by oncologist; patient thinks she is to wait on colonoscopy until after treatments are over; will send note to confirm       Other Visit Diagnoses    Hypokalemia    -  Primary   Relevant Orders   Basic Metabolic Panel (BMET)   Magnesium       Follow up plan: Return in about 3 months (around 05/04/2017) for twenty minute follow-up with fasting labs.  An after-visit summary was printed and given to the patient at [facility].  Please see the patient instructions which may contain other information and recommendations beyond what is mentioned above in the assessment and plan.  No orders of the defined types were placed in this encounter.   Orders Placed This Encounter  Procedures  . Basic Metabolic Panel (BMET)  . Magnesium  . Lipid panel  . T4, free  . TSH

## 2017-02-01 NOTE — Assessment & Plan Note (Signed)
Stop the statin for now and see if symptoms resolve; stop for two weeks and then call me to see if we adjust dose or resume; try to avoid saturated fats

## 2017-02-01 NOTE — Patient Instructions (Addendum)
Stop the cholesterol medicine completely for two weeks and then call to let me know how you feel Take the omeprazole every day for two weeks and also give me an update on the taste Let's check labs and if potassium low, we'll replace that and see if your symptoms go away If labs are normal, we'll get a Holter monitor

## 2017-02-01 NOTE — Assessment & Plan Note (Signed)
Managed by Dr. Finnegan 

## 2017-02-02 LAB — MAGNESIUM: MAGNESIUM: 1.4 mg/dL — AB (ref 1.5–2.5)

## 2017-02-09 ENCOUNTER — Telehealth: Payer: Self-pay

## 2017-02-09 NOTE — Telephone Encounter (Signed)
Pt need asked to clarify what OVC vitamin Dr. lada asked her to get. Mention to her Magnesium oxide 250 mg take three times a week. Pt understood

## 2017-02-12 ENCOUNTER — Other Ambulatory Visit: Payer: Self-pay | Admitting: Oncology

## 2017-02-16 ENCOUNTER — Encounter: Payer: Self-pay | Admitting: Oncology

## 2017-02-16 ENCOUNTER — Inpatient Hospital Stay: Payer: Medicare Other

## 2017-02-16 ENCOUNTER — Inpatient Hospital Stay (HOSPITAL_BASED_OUTPATIENT_CLINIC_OR_DEPARTMENT_OTHER): Payer: Medicare Other | Admitting: Oncology

## 2017-02-16 VITALS — BP 145/78 | HR 86 | Temp 98.3°F | Wt 163.3 lb

## 2017-02-16 DIAGNOSIS — E785 Hyperlipidemia, unspecified: Secondary | ICD-10-CM | POA: Diagnosis not present

## 2017-02-16 DIAGNOSIS — K219 Gastro-esophageal reflux disease without esophagitis: Secondary | ICD-10-CM | POA: Diagnosis not present

## 2017-02-16 DIAGNOSIS — I10 Essential (primary) hypertension: Secondary | ICD-10-CM | POA: Diagnosis not present

## 2017-02-16 DIAGNOSIS — T451X5A Adverse effect of antineoplastic and immunosuppressive drugs, initial encounter: Secondary | ICD-10-CM

## 2017-02-16 DIAGNOSIS — E669 Obesity, unspecified: Secondary | ICD-10-CM | POA: Diagnosis not present

## 2017-02-16 DIAGNOSIS — C50412 Malignant neoplasm of upper-outer quadrant of left female breast: Secondary | ICD-10-CM | POA: Diagnosis not present

## 2017-02-16 DIAGNOSIS — D6481 Anemia due to antineoplastic chemotherapy: Secondary | ICD-10-CM

## 2017-02-16 DIAGNOSIS — Z7982 Long term (current) use of aspirin: Secondary | ICD-10-CM

## 2017-02-16 DIAGNOSIS — Z171 Estrogen receptor negative status [ER-]: Principal | ICD-10-CM

## 2017-02-16 DIAGNOSIS — N2889 Other specified disorders of kidney and ureter: Secondary | ICD-10-CM | POA: Diagnosis not present

## 2017-02-16 DIAGNOSIS — F419 Anxiety disorder, unspecified: Secondary | ICD-10-CM | POA: Diagnosis not present

## 2017-02-16 DIAGNOSIS — Z79899 Other long term (current) drug therapy: Secondary | ICD-10-CM

## 2017-02-16 DIAGNOSIS — R739 Hyperglycemia, unspecified: Secondary | ICD-10-CM | POA: Diagnosis not present

## 2017-02-16 DIAGNOSIS — E876 Hypokalemia: Secondary | ICD-10-CM | POA: Diagnosis not present

## 2017-02-16 DIAGNOSIS — Z5111 Encounter for antineoplastic chemotherapy: Secondary | ICD-10-CM | POA: Diagnosis not present

## 2017-02-16 LAB — COMPREHENSIVE METABOLIC PANEL
ALT: 14 U/L (ref 14–54)
AST: 19 U/L (ref 15–41)
Albumin: 3.6 g/dL (ref 3.5–5.0)
Alkaline Phosphatase: 121 U/L (ref 38–126)
Anion gap: 4 — ABNORMAL LOW (ref 5–15)
BILIRUBIN TOTAL: 0.3 mg/dL (ref 0.3–1.2)
BUN: 21 mg/dL — AB (ref 6–20)
CHLORIDE: 113 mmol/L — AB (ref 101–111)
CO2: 24 mmol/L (ref 22–32)
Calcium: 9.2 mg/dL (ref 8.9–10.3)
Creatinine, Ser: 1.3 mg/dL — ABNORMAL HIGH (ref 0.44–1.00)
GFR, EST AFRICAN AMERICAN: 45 mL/min — AB (ref >60)
GFR, EST NON AFRICAN AMERICAN: 39 mL/min — AB (ref >60)
GLUCOSE: 87 mg/dL (ref 65–99)
POTASSIUM: 3.3 mmol/L — AB (ref 3.5–5.1)
Sodium: 141 mmol/L (ref 135–145)
Total Protein: 6.6 g/dL (ref 6.5–8.1)

## 2017-02-16 LAB — CBC WITH DIFFERENTIAL/PLATELET
BASOS ABS: 0 10*3/uL (ref 0–0.1)
Basophils Relative: 0 %
EOS PCT: 0 %
Eosinophils Absolute: 0 10*3/uL (ref 0–0.7)
HEMATOCRIT: 25 % — AB (ref 35.0–47.0)
Hemoglobin: 8.5 g/dL — ABNORMAL LOW (ref 12.0–16.0)
LYMPHS ABS: 1.8 10*3/uL (ref 1.0–3.6)
LYMPHS PCT: 41 %
MCH: 32.8 pg (ref 26.0–34.0)
MCHC: 34.1 g/dL (ref 32.0–36.0)
MCV: 96.4 fL (ref 80.0–100.0)
MONO ABS: 0.3 10*3/uL (ref 0.2–0.9)
Monocytes Relative: 8 %
NEUTROS ABS: 2.2 10*3/uL (ref 1.4–6.5)
Neutrophils Relative %: 51 %
PLATELETS: 132 10*3/uL — AB (ref 150–440)
RBC: 2.59 MIL/uL — ABNORMAL LOW (ref 3.80–5.20)
RDW: 18.8 % — AB (ref 11.5–14.5)
WBC: 4.4 10*3/uL (ref 3.6–11.0)

## 2017-02-16 NOTE — Progress Notes (Signed)
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  INTERVAL HISTORY: Patient returns to clinic today for further evaluation and consideration of cycle 6 of neoadjuvant Taxotere, carboplatin, Herceptin, and Perjeta. She currently feels "okay". She feels tired but this is not a new symptom. She continues to be anxious. She has no neurologic complaints. She denies any recent fevers or illnesses. She has a good appetite but has lost a little bit of weight. She has no chest pain or shortness of breath. She denies any nausea, vomiting, constipation, or diarrhea. She has no urinary complaints. Patient offers no further specific complaints today.  REVIEW OF SYSTEMS:   Review of Systems  Constitutional: Positive for malaise/fatigue. Negative for fever and weight loss.  Eyes: Negative.   Respiratory: Negative.  Negative for cough and shortness of breath.   Cardiovascular: Negative.  Negative for chest pain and leg swelling.  Gastrointestinal: Negative.  Negative for abdominal pain.  Genitourinary: Negative.   Musculoskeletal: Negative.   Skin: Negative.  Negative for rash.  Neurological: Negative.  Negative for sensory change and weakness.  Psychiatric/Behavioral: Negative for depression. The patient is nervous/anxious.     As per HPI. Otherwise, a complete review of systems is negative.  PAST MEDICAL HISTORY: Past Medical History:  Diagnosis Date  . Breast cancer ([place])   . Breast mass, left 09/22/2016   RECOMMENDATION: Ultrasound-guided core biopsies of masses at left breast 2 o'clock 2 cm from nipple, left breast 2 o'clock 5 cm from nipple, abnormal left axillary lymph nodes.  Marland Kitchen GERD  (gastroesophageal reflux disease)   . Hyperglycemia   . Hyperlipidemia   . Obesity   . Postmenopausal     PAST SURGICAL HISTORY: Past Surgical History:  Procedure Laterality Date  . ABDOMINAL HYSTERECTOMY    . BREAST BIOPSY Left 09/30/2016   US biopsy of 3 areas, awaiting pathology  . ESOPHAGOGASTRODUODENOSCOPY (EGD) WITH PROPOFOL N/A 12/24/2014   Procedure: ESOPHAGOGASTRODUODENOSCOPY (EGD) WITH PROPOFOL;  Surgeon: Manya Silvas, MD;  [facility] END[facility]COPY;  Service: Endoscopy;  Laterality: N/A;  . PORTACATH PLACEMENT Right 10/27/2016   Procedure: INSERTION PORT-A-CATH;  Surgeon: Nestor Lewandowsky, MD;  Location: [facility] ORS;  Service: General;  Laterality: Right;  . TONSILECTOMY, ADENOIDECTOMY, BILATERAL MYRINGOTOMY AND TUBES      FAMILY HISTORY: Family History  Problem Relation Age of Onset  . Stroke Mother   . Stroke Father   . Cancer Neg Hx   . Depression Neg Hx     ADVANCED DIRECTIVES (Y/N):  N  HEALTH MAINTENANCE: Social History  Substance Use Topics  . Smoking status: Never Smoker  . Smokeless tobacco: Never Used  . Alcohol use No     Colonoscopy:  PAP:  Bone density:  Lipid panel:  Allergies  Allergen Reactions  . Latex Itching  . Penicillins Itching and Rash    Has patient had a PCN reaction causing immediate rash, facial/tongue/throat swelling, SOB or lightheadedness with hypotension: Yes Has patient had a PCN reaction causing severe rash involving mucus membranes or skin necrosis: No Has patient had a PCN reaction that required hospitalization No Has patient had a PCN reaction occurring within the last 10 years: No If all of the above answers are "NO", then may proceed with  Cephalosporin use.     Current Outpatient Prescriptions  Medication Sig Dispense Refill  . acetaminophen (TYLENOL) 500 MG tablet Take 1,000 mg by mouth every 6 (six) hours as needed for mild pain.    Marland Kitchen aspirin 81 MG tablet Take 81 mg by mouth daily.    Marland Kitchen atorvastatin (LIPITOR) 40  MG tablet     . Cholecalciferol (VITAMIN D) 2000 units tablet Take 2,000 Units by mouth daily. One by mouth twice a week    . ferrous sulfate 325 (65 FE) MG EC tablet Take 325 mg by mouth every other day.     . fluticasone (FLONASE) 50 MCG/ACT nasal spray Place 1 spray into both nostrils daily as needed for allergies or rhinitis.    . Multiple Vitamin (MULTIVITAMIN WITH MINERALS) TABS tablet Take 1 tablet by mouth every other day.     Marland Kitchen omeprazole (PRILOSEC) 20 MG capsule Take 20 mg by mouth daily as needed (heartburn).     . ondansetron (ZOFRAN) 8 MG tablet Take 1 tablet (8 mg total) by mouth 2 (two) times daily as needed for refractory nausea / vomiting. 60 tablet 2  . prochlorperazine (COMPAZINE) 10 MG tablet Take 1 tablet (10 mg total) by mouth every 6 (six) hours as needed (Nausea or vomiting). 60 tablet 2   No current facility-administered medications for this visit.     OBJECTIVE: Vitals:   02/16/17 1011  BP: (!) 145/78  Pulse: 86  Temp: 98.3 F (36.8 C)     Body mass index is 30.86 kg/m.    ECOG FS:0 - Asymptomatic  General: Well-developed, well-nourished, no acute distress. Eyes: Pink conjunctiva, anicteric sclera. Breasts: Easily palpable left breast mass. Exam deferred today. Chest wall: Port site without erythema or induration. Lungs: Clear to auscultation bilaterally. Heart: Regular rate and rhythm. No rubs, murmurs, or gallops. Abdomen: Soft, nontender, nondistended. No organomegaly noted, normoactive bowel sounds. Musculoskeletal: No edema, cyanosis, or clubbing. Neuro: Alert, answering all questions appropriately. Cranial nerves grossly intact. Skin: No rashes or petechiae noted. Psych: Normal affect.  LAB RESULTS:  Lab Results  Component Value Date   NA 141 02/16/2017   K 3.3 (L) 02/16/2017   CL 113 (H) 02/16/2017   CO2 24 02/16/2017   GLUCOSE 87 02/16/2017   BUN 21 (H) 02/16/2017   CREATININE 1.30 (H) 02/16/2017   CALCIUM 9.2 02/16/2017   PROT 6.6  02/16/2017   ALBUMIN 3.6 02/16/2017   AST 19 02/16/2017   ALT 14 02/16/2017   ALKPHOS 121 02/16/2017   BILITOT 0.3 02/16/2017   GFRNONAA 39 (L) 02/16/2017   GFRAA 45 (L) 02/16/2017    Lab Results  Component Value Date   WBC 4.4 02/16/2017   NEUTROABS 2.2 02/16/2017   HGB 8.5 (L) 02/16/2017   HCT 25.0 (L) 02/16/2017   MCV 96.4 02/16/2017   PLT 132 (L) 02/16/2017     STUDIES: Nm Cardiac Muga Rest  Result Date: 01/22/2017 CLINICAL DATA:  LEFT breast cancer, cardiotoxic chemotherapy EXAM: NUCLEAR MEDICINE CARDIAC BLOOD POOL IMAGING (MUGA) TECHNIQUE: Cardiac multi-gated acquisition was performed at rest following intravenous injection of Tc-56mlabeled red blood cells. RADIOPHARMACEUTICALS:  21.67 mCi [id number] in-vitro labeled autologous red blood cells IV COMPARISON:  10/16/2016 FINDINGS: Calculated LEFT ventricular ejection fraction is 55%, within the normal range but decreased from the 65% on the previous exam. Study was obtained at a heart rate of 72 beats per minute. Patient was rhythmic during imaging. Cine analysis of the LEFT ventricle in 3 projections demonstrates  no focal wall motion abnormalities. IMPRESSION: Normal LEFT ventricular ejection fraction of 55% decreased from the 65% on the previous exam. Normal LV wall motion. Electronically Signed   By: Lavonia Dana M.D.   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  PLAN:    1. 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 Patient will then require surgery followed by adjuvant XRT. Patient will also receive Herceptin only every 3 weeks for an entire year after her surgery. An aromatase inhibitor would not offer  benefit given the ER/PR negativity of her disease. Repeat MUGA scan on January 22, 2017 revealed an EF of 55% which was 10% less than previous. Continue to monitor closely. Repeat in October 2018. PET scan results reviewed independently with no obvious metastatic disease other than her known disease in breast and axilla. Patient will return to clinic tomorrow for cycle 6 of 6 of neoadjuvant treatment. Patient also receives Neulasta support. Patient will be given a referral back to surgery at the conclusion of cycle 6, but she is already indicated that she likely refuse any surgical intervention. Referral made to see Dr. Genevive Bi.  2. Anemia: Secondary chemotherapy, monitor. Hemoglobin 8.5 today.  3. Leukocytosis: Resolved. Secondary to Neulasta. Monitor.  4. Renal insufficiency: Creatinine is only mildly elevated today, monitor. 5. Hypertension: Blood pressure mildly elevated, monitor. 6. Anxiety: Improved. Continue close follow-up with navigator. 7. Hypokalemia: K+ 3.3. Monitor.  Patient expressed understanding and was in agreement with this plan. She also understands that She can call clinic at any time with any questions, concerns, or complaints.   Cancer Staging Malignant neoplasm of upper-outer quadrant of left female breast [facility]) Staging form: Breast, AJCC 8th Edition - Clinical stage from 10/11/2016: Stage IIB (cT2, cN1, cM0, G3, ER: Negative, PR: Negative, HER2: Positive) - Signed by Lloyd Huger, MD on 10/11/2016   Jacquelin Hawking, NP   02/16/2017 10:13 AM

## 2017-02-17 ENCOUNTER — Inpatient Hospital Stay: Payer: Medicare Other | Attending: Oncology

## 2017-02-17 VITALS — BP 100/63 | HR 73 | Temp 98.7°F | Resp 18

## 2017-02-17 DIAGNOSIS — C50412 Malignant neoplasm of upper-outer quadrant of left female breast: Secondary | ICD-10-CM | POA: Insufficient documentation

## 2017-02-17 DIAGNOSIS — Z7982 Long term (current) use of aspirin: Secondary | ICD-10-CM | POA: Insufficient documentation

## 2017-02-17 DIAGNOSIS — Z5111 Encounter for antineoplastic chemotherapy: Secondary | ICD-10-CM | POA: Insufficient documentation

## 2017-02-17 DIAGNOSIS — K219 Gastro-esophageal reflux disease without esophagitis: Secondary | ICD-10-CM | POA: Diagnosis not present

## 2017-02-17 DIAGNOSIS — E669 Obesity, unspecified: Secondary | ICD-10-CM | POA: Insufficient documentation

## 2017-02-17 DIAGNOSIS — D6481 Anemia due to antineoplastic chemotherapy: Secondary | ICD-10-CM | POA: Insufficient documentation

## 2017-02-17 DIAGNOSIS — I10 Essential (primary) hypertension: Secondary | ICD-10-CM | POA: Insufficient documentation

## 2017-02-17 DIAGNOSIS — F419 Anxiety disorder, unspecified: Secondary | ICD-10-CM | POA: Diagnosis not present

## 2017-02-17 DIAGNOSIS — Z79899 Other long term (current) drug therapy: Secondary | ICD-10-CM | POA: Insufficient documentation

## 2017-02-17 DIAGNOSIS — Z171 Estrogen receptor negative status [ER-]: Secondary | ICD-10-CM | POA: Insufficient documentation

## 2017-02-17 DIAGNOSIS — Z5112 Encounter for antineoplastic immunotherapy: Secondary | ICD-10-CM | POA: Diagnosis not present

## 2017-02-17 DIAGNOSIS — N2889 Other specified disorders of kidney and ureter: Secondary | ICD-10-CM | POA: Diagnosis not present

## 2017-02-17 DIAGNOSIS — R739 Hyperglycemia, unspecified: Secondary | ICD-10-CM | POA: Insufficient documentation

## 2017-02-17 MED ORDER — SODIUM CHLORIDE 0.9% FLUSH
10.0000 mL | INTRAVENOUS | Status: DC | PRN
Start: 2017-02-17 — End: 2017-02-17
  Administered 2017-02-17: 10 mL via INTRAVENOUS
  Filled 2017-02-17: qty 10

## 2017-02-17 MED ORDER — DEXAMETHASONE SODIUM PHOSPHATE 10 MG/ML IJ SOLN
10.0000 mg | Freq: Once | INTRAMUSCULAR | Status: AC
Start: 2017-02-17 — End: 2017-02-17
  Administered 2017-02-17: 10 mg via INTRAVENOUS
  Filled 2017-02-17: qty 1

## 2017-02-17 MED ORDER — ACETAMINOPHEN 325 MG PO TABS
650.0000 mg | ORAL_TABLET | Freq: Once | ORAL | Status: AC
  Administered 2017-02-17: 650 mg via ORAL
  Filled 2017-02-17: qty 2

## 2017-02-17 MED ORDER — PALONOSETRON HCL INJECTION 0.25 MG/5ML
0.2500 mg | Freq: Once | INTRAVENOUS | Status: AC
  Administered 2017-02-17: 0.25 mg via INTRAVENOUS
  Filled 2017-02-17: qty 5

## 2017-02-17 MED ORDER — DIPHENHYDRAMINE HCL 25 MG PO CAPS
25.0000 mg | ORAL_CAPSULE | Freq: Once | ORAL | Status: AC
  Administered 2017-02-17: 25 mg via ORAL
  Filled 2017-02-17: qty 1

## 2017-02-17 MED ORDER — SODIUM CHLORIDE 0.9 % IV SOLN
420.0000 mg | Freq: Once | INTRAVENOUS | Status: AC
  Administered 2017-02-17: 420 mg via INTRAVENOUS
  Filled 2017-02-17: qty 14

## 2017-02-17 MED ORDER — SODIUM CHLORIDE 0.9 % IV SOLN
60.0000 mg/m2 | Freq: Once | INTRAVENOUS | Status: AC
  Administered 2017-02-17: 110 mg via INTRAVENOUS
  Filled 2017-02-17: qty 11

## 2017-02-17 MED ORDER — HEPARIN SOD (PORK) LOCK FLUSH 100 UNIT/ML IV SOLN
500.0000 [IU] | Freq: Once | INTRAVENOUS | Status: AC | PRN
  Administered 2017-02-17: 500 [IU]
  Filled 2017-02-17 (×2): qty 5

## 2017-02-17 MED ORDER — SODIUM CHLORIDE 0.9 % IV SOLN
Freq: Once | INTRAVENOUS | Status: AC
  Administered 2017-02-17: 09:00:00 via INTRAVENOUS
  Filled 2017-02-17: qty 1000

## 2017-02-17 MED ORDER — SODIUM CHLORIDE 0.9 % IV SOLN
349.5000 mg | Freq: Once | INTRAVENOUS | Status: AC
  Administered 2017-02-17: 350 mg via INTRAVENOUS
  Filled 2017-02-17: qty 35

## 2017-02-17 MED ORDER — TRASTUZUMAB CHEMO 150 MG IV SOLR
450.0000 mg | Freq: Once | INTRAVENOUS | Status: AC
  Administered 2017-02-17: 450 mg via INTRAVENOUS
  Filled 2017-02-17: qty 21.43

## 2017-02-17 MED ORDER — PEGFILGRASTIM 6 MG/0.6ML ~~LOC~~ PSKT
6.0000 mg | PREFILLED_SYRINGE | Freq: Once | SUBCUTANEOUS | Status: AC
  Administered 2017-02-17: 6 mg via SUBCUTANEOUS
  Filled 2017-02-17: qty 0.6

## 2017-02-17 NOTE — Progress Notes (Deleted)
Proceed with Carboplatin dose reduction per Rulon Abide, NP.  Dose reduction due to MUGA scan.

## 2017-03-08 NOTE — Progress Notes (Signed)
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  INTERVAL HISTORY: Patient returns to clinic today for further evaluation and consideration of cycle 7 of Herceptin only. She completed her neoadjuvant chemotherapy on February 17, 2017. She currently feels well and is asymptomatic. She continues to be anxious. She has no neurologic complaints. She denies any recent fevers or illnesses. She has a good appetite and denies weight loss. She has no chest pain or shortness of breath. She denies any nausea, vomiting, constipation, or diarrhea. She has no urinary complaints. Patient offers no further specific complaints today.  REVIEW OF SYSTEMS:   Review of Systems  Constitutional: Negative.  Negative for fever, malaise/fatigue and weight loss.  Respiratory: Negative.  Negative for cough and shortness of breath.   Cardiovascular: Negative.  Negative for chest pain and leg swelling.  Gastrointestinal: Negative.  Negative for abdominal pain.  Genitourinary: Negative.   Musculoskeletal: Negative.   Skin: Negative.  Negative for rash.  Neurological: Negative.  Negative for sensory change and weakness.  Psychiatric/Behavioral: Negative for depression. The patient is nervous/anxious.     As per HPI. Otherwise, a complete review of systems is negative.  PAST MEDICAL HISTORY: Past Medical History:  Diagnosis Date  . Breast cancer ([place])   . Breast mass, left 09/22/2016   RECOMMENDATION: Ultrasound-guided core biopsies of masses at left breast 2 o'clock 2 cm from nipple, left breast 2 o'clock 5 cm from nipple, abnormal left axillary lymph nodes.  Marland Kitchen GERD (gastroesophageal reflux disease)   . Hyperglycemia    . Hyperlipidemia   . Obesity   . Postmenopausal     PAST SURGICAL HISTORY: Past Surgical History:  Procedure Laterality Date  . ABDOMINAL HYSTERECTOMY    . BREAST BIOPSY Left 09/30/2016   US biopsy of 3 areas, awaiting pathology  . ESOPHAGOGASTRODUODENOSCOPY (EGD) WITH PROPOFOL N/A 12/24/2014   Procedure: ESOPHAGOGASTRODUODENOSCOPY (EGD) WITH PROPOFOL;  Surgeon: Manya Silvas, MD;  Location: [facility] ENDOSCOPY;  Service: Endoscopy;  Laterality: N/A;  . PORTACATH PLACEMENT Right 10/27/2016   Procedure: INSERTION PORT-A-CATH;  Surgeon: Nestor Lewandowsky, MD;  Location: [facility] ORS;  Service: General;  Laterality: Right;  . TONSILECTOMY, ADENOIDECTOMY, BILATERAL MYRINGOTOMY AND TUBES      FAMILY HISTORY: Family History  Problem Relation Age of Onset  . Stroke Mother   . Stroke Father   . Cancer Neg Hx   . Depression Neg Hx     ADVANCED DIRECTIVES (Y/N):  N  HEALTH MAINTENANCE: Social History  Substance Use Topics  . Smoking status: Never Smoker  . Smokeless tobacco: Never Used  . Alcohol use No     Colonoscopy:  PAP:  Bone density:  Lipid panel:  Allergies  Allergen Reactions  . Latex Itching  . Penicillins Itching and Rash    Has patient had a PCN reaction causing immediate rash, facial/tongue/throat swelling, SOB or lightheadedness with hypotension: Yes Has patient had a PCN reaction causing severe rash involving mucus membranes or skin necrosis: No Has patient had a PCN reaction that required hospitalization No Has patient had a PCN reaction occurring within the last 10 years: No If all of the above answers are "NO", then may proceed with Cephalosporin use.     Current Outpatient Prescriptions  Medication Sig Dispense Refill  . acetaminophen (TYLENOL) 500 MG tablet Take 1,000 mg by mouth every 6 (six) hours as needed for mild pain.    Marland Kitchen aspirin 81 MG tablet Take 81 mg by mouth daily.    Marland Kitchen atorvastatin (LIPITOR) 40 MG tablet     . Cholecalciferol (VITAMIN D) 2000 units  tablet Take 2,000 Units by mouth daily. One by mouth twice a week    . ferrous sulfate 325 (65 FE) MG EC tablet Take 325 mg by mouth every other day.     . fluticasone (FLONASE) 50 MCG/ACT nasal spray Place 1 spray into both nostrils daily as needed for allergies or rhinitis.    . Multiple Vitamin (MULTIVITAMIN WITH MINERALS) TABS tablet Take 1 tablet by mouth every other day.     Marland Kitchen omeprazole (PRILOSEC) 20 MG capsule Take 20 mg by mouth daily as needed (heartburn).     . ondansetron (ZOFRAN) 8 MG tablet Take 1 tablet (8 mg total) by mouth 2 (two) times daily as needed for refractory nausea / vomiting. 60 tablet 2  . prochlorperazine (COMPAZINE) 10 MG tablet Take 1 tablet (10 mg total) by mouth every 6 (six) hours as needed (Nausea or vomiting). 60 tablet 2   No current facility-administered medications for this visit.     OBJECTIVE: Vitals:   03/09/17 1055  BP: 127/77  Pulse: 86  Resp: 18  Temp: 97.7 F (36.5 C)     Body mass index is 30.63 kg/m.    ECOG FS:0 - Asymptomatic  General: Well-developed, well-nourished, no acute distress. Eyes: Pink conjunctiva, anicteric sclera. Breasts: Easily palpable left breast mass. Exam deferred today. Chest wall: Port site without erythema or induration. Lungs: Clear to auscultation bilaterally. Heart: Regular rate and rhythm. No rubs, murmurs, or gallops. Abdomen: Soft, nontender, nondistended. No organomegaly noted, normoactive bowel sounds. Musculoskeletal: No edema, cyanosis, or clubbing. Neuro: Alert, answering all questions appropriately. Cranial nerves grossly intact. Skin: No rashes or petechiae noted. Psych: Normal affect.  LAB RESULTS:  Lab Results  Component Value Date   NA 144 03/09/2017   K 3.3 (L) 03/09/2017   CL 113 (H) 03/09/2017   CO2 24 03/09/2017   GLUCOSE 80 03/09/2017   BUN 21 (H) 03/09/2017   CREATININE 1.43 (H) 03/09/2017   CALCIUM 8.7 (L) 03/09/2017   PROT 6.5 03/09/2017   ALBUMIN 3.4 (L) 03/09/2017   AST 23  03/09/2017   ALT 20 03/09/2017   ALKPHOS 115 03/09/2017   BILITOT 0.4 03/09/2017   GFRNONAA 34 (L) 03/09/2017   GFRAA 40 (L) 03/09/2017    Lab Results  Component Value Date   WBC 7.5 03/09/2017   NEUTROABS 5.0 03/09/2017   HGB 8.3 (L) 03/09/2017   HCT 24.0 (L) 03/09/2017   MCV 99.0 03/09/2017   PLT 179 03/09/2017     STUDIES: No results found.  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  PLAN:    1. 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 Patient will now require surgery followed by adjuvant XRT. She initially refused a referral back to surgery, but now has agreed to be evaluated. Patient will also receive Herceptin only every 3 weeks for an entire year after her surgery. An aromatase inhibitor would not offer benefit given the ER/PR  negativity of her disease. Repeat MUGA scan on January 22, 2017 revealed an EF of 55% which was 10% less than previous. Continue to monitor closely. Repeat in October 2018. PET scan results reviewed independently with no obvious metastatic disease other than her known disease in breast and axilla. Return to clinic in 3 weeks for consideration of cycle 8 of Herceptin only.  2. Anemia: Secondary chemotherapy, monitor. 3. Leukocytosis: Resolved. Monitor.  4. Renal insufficiency: Creatinine is only mildly elevated today, monitor. 5. Hypertension: Blood pressure is within normal limits today. 6. Anxiety: Improved. Continue close follow-up with navigator.  Patient expressed understanding and was in agreement with this plan. She also understands that She can call clinic at any time with any questions, concerns, or complaints.   Cancer Staging Malignant neoplasm of upper-outer quadrant of  left female breast [facility]) Staging form: Breast, AJCC 8th Edition - Clinical stage from 10/11/2016: Stage IIB (cT2, cN1, cM0, G3, ER: Negative, PR: Negative, HER2: Positive) - Signed by Lloyd Huger, MD on 10/11/2016  Beverely Risen. Zenia Resides, NP   Patient was seen and evaluated independently and I agree with the assessment and plan as dictated above.  Lloyd Huger, MD 03/13/17 8:38 AM

## 2017-03-09 ENCOUNTER — Inpatient Hospital Stay (HOSPITAL_BASED_OUTPATIENT_CLINIC_OR_DEPARTMENT_OTHER): Payer: Medicare Other | Admitting: Oncology

## 2017-03-09 ENCOUNTER — Inpatient Hospital Stay: Payer: Medicare Other

## 2017-03-09 VITALS — BP 127/77 | HR 86 | Temp 97.7°F | Resp 18 | Wt 162.1 lb

## 2017-03-09 DIAGNOSIS — F419 Anxiety disorder, unspecified: Secondary | ICD-10-CM | POA: Diagnosis not present

## 2017-03-09 DIAGNOSIS — D6481 Anemia due to antineoplastic chemotherapy: Secondary | ICD-10-CM

## 2017-03-09 DIAGNOSIS — E669 Obesity, unspecified: Secondary | ICD-10-CM | POA: Diagnosis not present

## 2017-03-09 DIAGNOSIS — I10 Essential (primary) hypertension: Secondary | ICD-10-CM | POA: Diagnosis not present

## 2017-03-09 DIAGNOSIS — Z7982 Long term (current) use of aspirin: Secondary | ICD-10-CM

## 2017-03-09 DIAGNOSIS — R739 Hyperglycemia, unspecified: Secondary | ICD-10-CM

## 2017-03-09 DIAGNOSIS — C50412 Malignant neoplasm of upper-outer quadrant of left female breast: Secondary | ICD-10-CM

## 2017-03-09 DIAGNOSIS — N2889 Other specified disorders of kidney and ureter: Secondary | ICD-10-CM

## 2017-03-09 DIAGNOSIS — K219 Gastro-esophageal reflux disease without esophagitis: Secondary | ICD-10-CM

## 2017-03-09 DIAGNOSIS — Z171 Estrogen receptor negative status [ER-]: Secondary | ICD-10-CM | POA: Diagnosis not present

## 2017-03-09 DIAGNOSIS — Z5112 Encounter for antineoplastic immunotherapy: Secondary | ICD-10-CM | POA: Diagnosis not present

## 2017-03-09 DIAGNOSIS — Z5111 Encounter for antineoplastic chemotherapy: Secondary | ICD-10-CM | POA: Diagnosis not present

## 2017-03-09 DIAGNOSIS — Z79899 Other long term (current) drug therapy: Secondary | ICD-10-CM

## 2017-03-09 LAB — COMPREHENSIVE METABOLIC PANEL
ALT: 20 U/L (ref 14–54)
ANION GAP: 7 (ref 5–15)
AST: 23 U/L (ref 15–41)
Albumin: 3.4 g/dL — ABNORMAL LOW (ref 3.5–5.0)
Alkaline Phosphatase: 115 U/L (ref 38–126)
BILIRUBIN TOTAL: 0.4 mg/dL (ref 0.3–1.2)
BUN: 21 mg/dL — AB (ref 6–20)
CO2: 24 mmol/L (ref 22–32)
Calcium: 8.7 mg/dL — ABNORMAL LOW (ref 8.9–10.3)
Chloride: 113 mmol/L — ABNORMAL HIGH (ref 101–111)
Creatinine, Ser: 1.43 mg/dL — ABNORMAL HIGH (ref 0.44–1.00)
GFR calc Af Amer: 40 mL/min — ABNORMAL LOW (ref >60)
GFR calc non Af Amer: 34 mL/min — ABNORMAL LOW (ref >60)
Glucose, Bld: 80 mg/dL (ref 65–99)
POTASSIUM: 3.3 mmol/L — AB (ref 3.5–5.1)
Sodium: 144 mmol/L (ref 135–145)
TOTAL PROTEIN: 6.5 g/dL (ref 6.5–8.1)

## 2017-03-09 LAB — CBC WITH DIFFERENTIAL/PLATELET
Basophils Absolute: 0 10*3/uL (ref 0–0.1)
Basophils Relative: 1 %
Eosinophils Absolute: 0 10*3/uL (ref 0–0.7)
Eosinophils Relative: 0 %
HEMATOCRIT: 24 % — AB (ref 35.0–47.0)
Hemoglobin: 8.3 g/dL — ABNORMAL LOW (ref 12.0–16.0)
LYMPHS ABS: 1.9 10*3/uL (ref 1.0–3.6)
LYMPHS PCT: 26 %
MCH: 34.2 pg — AB (ref 26.0–34.0)
MCHC: 34.6 g/dL (ref 32.0–36.0)
MCV: 99 fL (ref 80.0–100.0)
MONO ABS: 0.5 10*3/uL (ref 0.2–0.9)
MONOS PCT: 6 %
NEUTROS ABS: 5 10*3/uL (ref 1.4–6.5)
Neutrophils Relative %: 67 %
Platelets: 179 10*3/uL (ref 150–440)
RBC: 2.42 MIL/uL — ABNORMAL LOW (ref 3.80–5.20)
RDW: 18.4 % — AB (ref 11.5–14.5)
WBC: 7.5 10*3/uL (ref 3.6–11.0)

## 2017-03-10 ENCOUNTER — Ambulatory Visit: Payer: Medicare Other

## 2017-03-10 ENCOUNTER — Inpatient Hospital Stay: Payer: Medicare Other

## 2017-03-10 VITALS — BP 138/67 | HR 89 | Temp 97.6°F | Resp 18

## 2017-03-10 DIAGNOSIS — Z171 Estrogen receptor negative status [ER-]: Secondary | ICD-10-CM | POA: Diagnosis not present

## 2017-03-10 DIAGNOSIS — N2889 Other specified disorders of kidney and ureter: Secondary | ICD-10-CM | POA: Diagnosis not present

## 2017-03-10 DIAGNOSIS — C50412 Malignant neoplasm of upper-outer quadrant of left female breast: Secondary | ICD-10-CM | POA: Diagnosis not present

## 2017-03-10 DIAGNOSIS — R739 Hyperglycemia, unspecified: Secondary | ICD-10-CM | POA: Diagnosis not present

## 2017-03-10 DIAGNOSIS — K219 Gastro-esophageal reflux disease without esophagitis: Secondary | ICD-10-CM | POA: Diagnosis not present

## 2017-03-10 DIAGNOSIS — Z79899 Other long term (current) drug therapy: Secondary | ICD-10-CM | POA: Diagnosis not present

## 2017-03-10 DIAGNOSIS — Z7982 Long term (current) use of aspirin: Secondary | ICD-10-CM | POA: Diagnosis not present

## 2017-03-10 DIAGNOSIS — I10 Essential (primary) hypertension: Secondary | ICD-10-CM | POA: Diagnosis not present

## 2017-03-10 DIAGNOSIS — Z5111 Encounter for antineoplastic chemotherapy: Secondary | ICD-10-CM | POA: Diagnosis not present

## 2017-03-10 DIAGNOSIS — Z5112 Encounter for antineoplastic immunotherapy: Secondary | ICD-10-CM | POA: Diagnosis not present

## 2017-03-10 DIAGNOSIS — D6481 Anemia due to antineoplastic chemotherapy: Secondary | ICD-10-CM | POA: Diagnosis not present

## 2017-03-10 MED ORDER — SODIUM CHLORIDE 0.9 % IV SOLN
Freq: Once | INTRAVENOUS | Status: AC
  Administered 2017-03-10: 10:00:00 via INTRAVENOUS
  Filled 2017-03-10: qty 1000

## 2017-03-10 MED ORDER — HEPARIN SOD (PORK) LOCK FLUSH 100 UNIT/ML IV SOLN
500.0000 [IU] | Freq: Once | INTRAVENOUS | Status: AC | PRN
  Administered 2017-03-10: 500 [IU]
  Filled 2017-03-10: qty 5

## 2017-03-10 MED ORDER — SODIUM CHLORIDE 0.9% FLUSH
10.0000 mL | INTRAVENOUS | Status: DC | PRN
  Administered 2017-03-10: 10 mL
  Filled 2017-03-10: qty 10

## 2017-03-10 MED ORDER — ACETAMINOPHEN 325 MG PO TABS
650.0000 mg | ORAL_TABLET | Freq: Once | ORAL | Status: AC
  Administered 2017-03-10: 650 mg via ORAL
  Filled 2017-03-10: qty 2

## 2017-03-10 MED ORDER — DIPHENHYDRAMINE HCL 25 MG PO CAPS
25.0000 mg | ORAL_CAPSULE | Freq: Once | ORAL | Status: AC
  Administered 2017-03-10: 25 mg via ORAL
  Filled 2017-03-10: qty 1

## 2017-03-10 MED ORDER — TRASTUZUMAB CHEMO 150 MG IV SOLR
450.0000 mg | Freq: Once | INTRAVENOUS | Status: AC
  Administered 2017-03-10: 450 mg via INTRAVENOUS
  Filled 2017-03-10: qty 21.43

## 2017-03-11 ENCOUNTER — Ambulatory Visit (INDEPENDENT_AMBULATORY_CARE_PROVIDER_SITE_OTHER): Payer: Medicare Other | Admitting: Surgery

## 2017-03-11 ENCOUNTER — Encounter: Payer: Self-pay | Admitting: Surgery

## 2017-03-11 VITALS — BP 129/68 | HR 93 | Temp 97.9°F | Ht 61.0 in | Wt 168.0 lb

## 2017-03-11 DIAGNOSIS — C50412 Malignant neoplasm of upper-outer quadrant of left female breast: Secondary | ICD-10-CM

## 2017-03-11 DIAGNOSIS — Z171 Estrogen receptor negative status [ER-]: Secondary | ICD-10-CM

## 2017-03-11 NOTE — Patient Instructions (Addendum)
Dr.Piscoya will consult with Oncology to see if getting another PET scan will be helpful. He will call you after he speaks with them.  We will send the referral to [facility].Someone from their office will contact you to schedule an appointment. If you have not heard from anyone from their office  In 5-7 days please call us and let us know so we can check on this.  Please call our office if you have any questions or concerns.     Total or Modified Radical Mastectomy A total mastectomy and a modified radical mastectomy are types of surgery for breast cancer. If you are having a total mastectomy (simple mastectomy), your entire breast will be removed. If you are having a modified radical mastectomy, your breast and nipple will be removed along with the lymph nodes under your arm. You may also have some of the lining over the muscle tissues under your breast removed. LET [facility] CARE PROVIDER KNOW ABOUT:  Any allergies you have.  All medicines you are taking, including vitamins, herbs, eye drops, creams, and over-the-counter medicines.  Previous problems you or members of your family have had with the use of anesthetics.  Any blood disorders you have.  Previous surgeries you have had.  Medical conditions you have. RISKS AND COMPLICATIONS Generally, this is a safe procedure. However, problems may occur, including:  Pain.  Infection.  Bleeding.  Scar tissue.  Chest numbness on the side of the surgery.  Fluid buildup under the skin flaps where your breast was removed (seroma).  Sensation of throbbing or tingling.  Stress or sadness from losing your breast. If you have the lymph nodes under your arm removed, you may have arm swelling, weakness, or numbness on the same side of your body as your surgery. BEFORE THE PROCEDURE  Ask your health care provider about:  Changing or stopping your regular medicines. This is especially important if you are taking diabetes  medicines or blood thinners.  Taking medicines such as aspirin and ibuprofen. These medicines can thin your blood. Do not take these medicines before your procedure if your health care provider instructs you not to.  Follow your health care provider's instructions about eating or drinking restrictions.  Plan to have someone take you home after the procedure. PROCEDURE  An IV tube will be inserted into one of your veins.  You will be given a medicine that makes you fall asleep (general anesthetic).  Your breast will be cleaned with a germ-killing solution (antiseptic).  A wide incision will be made around your nipple. The skin and nipple inside the incision will be removed along with all breast tissue.  If you are having a modified radical mastectomy:  The lining over your chest muscles will be removed.  The incision may be extended to reach the lymph nodes under your arm, or a second incision may be made.  The lymph nodes will be removed.  You may have a drainage tube inserted into your incision to collect fluid that builds up after surgery. This tube is connected to a suction bulb.  Your incision or incisions will be closed with stitches (sutures).  A bandage (dressing) will be placed over your breast and under your arm. The procedure may vary among health care providers and hospitals. AFTER THE PROCEDURE  You will be moved to a recovery area.  Your blood pressure, heart rate, breathing rate, and blood oxygen level will be monitored often until the medicines you were given have worn  off.  You will be given pain medicine as needed.  After a while, you will be taken to a hospital room.  You will be encouraged to get up and walk as soon as you can.  Your IV tube can be removed when you are able to eat and drink.  Your drain may be removed before you go home from the hospital, or you may be sent home with your drain and suction bulb.   This information is not intended to  replace advice given to you by your health care provider. Make sure you discuss any questions you have with your health care provider.   Document Released: 03/31/2001 Document Revised: 07/27/2014 Document Reviewed: 03/21/2014 Elsevier Interactive Patient Education [employer].

## 2017-03-11 NOTE — Progress Notes (Signed)
03/11/2017  History of Present Illness: Maria Patterson is a 76 y.o. female with history of with history of stage IIB ER/PR negative, HER-2 positive invasive carcinoma of the left breast. Was initially seen by me on 3/22 and was started on chemotherapy and is being followed by Dr. Grayland Ormond. She has completed 6 cycles of neoadjuvant chemotherapy and presents today for further evaluation for surgical intervention. Initially appear the patient was not interested in surgery, but now she is in does want to learn about what the options are. She denies any current symptoms. Reports that she has had episodes of weakness at times and decreased energy but otherwise currently is doing well. Denies any new masses but she also reports not doing any self breast exams. Denies any current left breast pain or axillary pain.  Past Medical History: Past Medical History:  Diagnosis Date  . Breast cancer ([place])   . Breast mass, left 09/22/2016   RECOMMENDATION: Ultrasound-guided core biopsies of masses at left breast 2 o'clock 2 cm from nipple, left breast 2 o'clock 5 cm from nipple, abnormal left axillary lymph nodes.  Marland Kitchen GERD (gastroesophageal reflux disease)   . Hyperglycemia   . Hyperlipidemia   . Obesity   . Postmenopausal      Past Surgical History: Past Surgical History:  Procedure Laterality Date  . ABDOMINAL HYSTERECTOMY    . BREAST BIOPSY Left 09/30/2016   US biopsy of 3 areas, awaiting pathology  . ESOPHAGOGASTRODUODENOSCOPY (EGD) WITH PROPOFOL N/A 12/24/2014   Procedure: ESOPHAGOGASTRODUODENOSCOPY (EGD) WITH PROPOFOL;  Surgeon: Manya Silvas, MD;  Location: [facility] ENDOSCOPY;  Service: Endoscopy;  Laterality: N/A;  . PORTACATH PLACEMENT Right 10/27/2016   Procedure: INSERTION PORT-A-CATH;  Surgeon: Nestor Lewandowsky, MD;  Location: [facility] ORS;  Service: General;  Laterality: Right;  . TONSILECTOMY, ADENOIDECTOMY, BILATERAL MYRINGOTOMY AND TUBES      Home Medications: Prior to Admission medications    Medication Sig Start Date End Date Taking? Authorizing Provider  acetaminophen (TYLENOL) 500 MG tablet Take 1,000 mg by mouth every 6 (six) hours as needed for mild pain.   Yes [provider]  aspirin 81 MG tablet Take 81 mg by mouth daily.   Yes [provider]  atorvastatin (LIPITOR) 40 MG tablet  12/31/16  Yes [provider]  Cholecalciferol (VITAMIN D) 2000 units tablet Take 2,000 Units by mouth daily. One by mouth twice a week 09/01/16  Yes Lada, Satira Anis, MD  ferrous sulfate 325 (65 FE) MG EC tablet Take 325 mg by mouth every other day.  09/01/16  Yes Lada, Satira Anis, MD  fluticasone (FLONASE) 50 MCG/ACT nasal spray Place 1 spray into both nostrils daily as needed for allergies or rhinitis.   Yes [provider]  Multiple Vitamin (MULTIVITAMIN WITH MINERALS) TABS tablet Take 1 tablet by mouth every other day.  09/01/16  Yes Lada, Satira Anis, MD  omeprazole (PRILOSEC) 20 MG capsule Take 20 mg by mouth daily as needed (heartburn).    Yes [provider]  ondansetron (ZOFRAN) 8 MG tablet Take 1 tablet (8 mg total) by mouth 2 (two) times daily as needed for refractory nausea / vomiting. 10/20/16  Yes Lloyd Huger, MD  prochlorperazine (COMPAZINE) 10 MG tablet Take 1 tablet (10 mg total) by mouth every 6 (six) hours as needed (Nausea or vomiting). 10/20/16  Yes Lloyd Huger, MD    Allergies: Allergies  Allergen Reactions  . Latex Itching  . Penicillins Itching and Rash    Has patient had  a PCN reaction causing immediate rash, facial/tongue/throat swelling, SOB or lightheadedness with hypotension: Yes Has patient had a PCN reaction causing severe rash involving mucus membranes or skin necrosis: No Has patient had a PCN reaction that required hospitalization No Has patient had a PCN reaction occurring within the last 10 years: No If all of the above answers are "NO", then may proceed with Cephalosporin use.    Review of Systems: Review  of Systems  Constitutional: Negative for chills and fever.  Respiratory: Negative for shortness of breath.   Cardiovascular: Negative for chest pain.  Gastrointestinal: Negative for abdominal pain.  Skin: Negative for rash.    Physical Exam BP 129/68   Pulse 93   Temp 97.9 F (36.6 C) (Oral)   Ht '5\' 1"'  (1.549 m)   Wt 76.2 kg (168 lb)   BMI 31.74 kg/m  CONSTITUTIONAL: No acute distress HEENT:  Normocephalic, atraumatic, extraocular motion intact. LYMPH NODES:  Lymph nodes in the neck are not enlarged. Bilateral axilla were examined and lymph nodes are not enlarged either. RESPIRATORY:  Lungs are clear, and breath sounds are equal bilaterally. Normal respiratory effort without pathologic use of accessory muscles. CARDIOVASCULAR: Heart is regular without murmurs, gallops, or rubs. BREAST: Left breast was examined and I am unable to palpate any masses on the left breast any more. She did have 2 masses on her previous visit with me prior to chemotherapy. There is no nipple discoloration or changes no skin discoloration or changes. The right breast is benign with no masses palpable.  GI: The abdomen is soft, nondistended, nontender.  MUSCULOSKELETAL:  Normal muscle strength and tone in all four extremities.  No peripheral edema or cyanosis. SKIN: Skin turgor is normal. There are no pathologic skin lesions.  NEUROLOGIC:  Motor and sensation is grossly normal.  Cranial nerves are grossly intact. PSYCH:  Alert and oriented to person, place and time. Affect is normal.  Labs/Imaging: Recent labs show a white blood cell count of 7.5, hemoglobin 8.3, hematocrit 24, platelet count 179. Sodium 144, potassium 3.3, chloride 113, bicarbonate 24, BU and 21, creatinine 1.43. LFTs within normal limits.  Assessment and Plan: This is a 76 y.o. female who presents with stage IIB invasive breast cancer of the left breast.  Discussed with the patient currently I cannot palpate any masses. However given her  history, and that there were 2 masses on the left breast, she would likely require a mastectomy as part of her surgical management. On her prior PET scan, there were subpectoral as well as left axillary lymph nodes. Currently I cannot palpate any lymph nodes on exam either. We'll discuss with the oncology team whether she would require a follow-up PET scan to evaluate her response to chemotherapy given that I cannot palpate any masses as this would potentially change her surgical management. We'll discuss with them further as to whether she would require a full axillary dissection versus a sentinel node biopsy only. The patient is very interested in breast conservation therapy, and although this may not be feasible, she would like a second opinion as well. We will send a referral to [facility] so that she can that be evaluated by them as well. We will call the patient with more information based on our discussion with the oncology team and she is aware that the team at [facility] will call her to set up an appointment. Patient understands this plan and all of her questions have been answered.  Face-to-face time spent with  the patient and care providers was 40 minutes, with more than 50% of the time spent counseling, educating, and coordinating care of the patient.     Melvyn Neth, [place]

## 2017-03-11 NOTE — Addendum Note (Signed)
Addended by: Celene Kras on: 03/11/2017 04:43 PM   Modules accepted: Orders

## 2017-03-12 ENCOUNTER — Telehealth: Payer: Self-pay

## 2017-03-12 ENCOUNTER — Other Ambulatory Visit: Payer: Self-pay | Admitting: Surgery

## 2017-03-12 NOTE — Addendum Note (Signed)
Addended by: Celene Kras on: 03/12/2017 08:57 AM   Modules accepted: Orders

## 2017-03-12 NOTE — Telephone Encounter (Signed)
Patient notified of appointments listed below.   PET scan [facility] 03/17/17 @ 11:00  Nothing by mouth 8 hours prior to scan  Mammogram/ Ultrasound 03/23/17 @ 3pm @ [facility]

## 2017-03-17 ENCOUNTER — Ambulatory Visit
Admission: RE | Admit: 2017-03-17 | Discharge: 2017-03-17 | Disposition: A | Discharge status: Home / Self Care | Payer: Medicare Other | Source: Ambulatory Visit | Attending: Surgery | Admitting: Surgery

## 2017-03-17 DIAGNOSIS — I700 Atherosclerosis of aorta: Secondary | ICD-10-CM | POA: Diagnosis not present

## 2017-03-17 DIAGNOSIS — Z171 Estrogen receptor negative status [ER-]: Secondary | ICD-10-CM | POA: Diagnosis not present

## 2017-03-17 DIAGNOSIS — I2510 Atherosclerotic heart disease of native coronary artery without angina pectoris: Secondary | ICD-10-CM | POA: Insufficient documentation

## 2017-03-17 DIAGNOSIS — C50412 Malignant neoplasm of upper-outer quadrant of left female breast: Secondary | ICD-10-CM | POA: Diagnosis not present

## 2017-03-17 DIAGNOSIS — C50912 Malignant neoplasm of unspecified site of left female breast: Secondary | ICD-10-CM | POA: Diagnosis not present

## 2017-03-17 LAB — GLUCOSE, CAPILLARY: Glucose-Capillary: 74 mg/dL (ref 65–99)

## 2017-03-17 MED ORDER — FLUDEOXYGLUCOSE F - 18 (FDG) INJECTION
11.6600 | Freq: Once | INTRAVENOUS | Status: AC | PRN
  Administered 2017-03-17: 11.66 via INTRAVENOUS

## 2017-03-23 ENCOUNTER — Ambulatory Visit
Admission: RE | Admit: 2017-03-23 | Discharge: 2017-03-23 | Disposition: A | Discharge status: Home / Self Care | Payer: Medicare Other | Source: Ambulatory Visit | Attending: Surgery | Admitting: Surgery

## 2017-03-23 DIAGNOSIS — C50412 Malignant neoplasm of upper-outer quadrant of left female breast: Secondary | ICD-10-CM

## 2017-03-23 DIAGNOSIS — Z171 Estrogen receptor negative status [ER-]: Secondary | ICD-10-CM | POA: Insufficient documentation

## 2017-03-23 DIAGNOSIS — N6489 Other specified disorders of breast: Secondary | ICD-10-CM | POA: Diagnosis not present

## 2017-03-23 DIAGNOSIS — R922 Inconclusive mammogram: Secondary | ICD-10-CM | POA: Diagnosis not present

## 2017-03-30 NOTE — Progress Notes (Signed)
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  INTERVAL HISTORY: Patient returns to clinic today for further evaluation and consideration of cycle 8 of Herceptin only. She completed her neoadjuvant chemotherapy on February 17, 2017. She complains of bilateral hip pain that began over one month ago and right ankle pain that started two weeks ago. She denies injury. She has not needed any medication for pain but does state that both are getting worse. She is still able to walk without assistance. Otherwise she feels well and is asymptomatic. She continues to be anxious. She has no neurologic complaints. She denies any recent fevers or illnesses. She states she continues to have a good appetite despite her 9 lb weight loss. She states she likes to drink a lot of fluids that include sodas, lemonade and tea and occasionally "forgets" to eat.. She has no chest pain or shortness of breath. She denies any nausea, vomiting, constipation, or diarrhea. She has no urinary complaints.  REVIEW OF SYSTEMS:   Review of Systems  Constitutional: Negative.  Negative for fever, malaise/fatigue and weight loss.  Respiratory: Negative.  Negative for cough and shortness of breath.   Cardiovascular: Negative.  Negative for chest pain and leg swelling.  Gastrointestinal: Negative.  Negative for abdominal pain.  Genitourinary: Negative.   Musculoskeletal: Positive for joint pain and myalgias.       Hips and right ankle  Skin: Negative.  Negative for rash.  Neurological: Negative.  Negative for sensory change and weakness.  Psychiatric/Behavioral: Negative for depression. The patient is nervous/anxious.     As per  HPI. Otherwise, a complete review of systems is negative.  PAST MEDICAL HISTORY: Past Medical History:  Diagnosis Date  . Breast cancer ([place])   . Breast mass, left 09/22/2016   RECOMMENDATION: Ultrasound-guided core biopsies of masses at left breast 2 o'clock 2 cm from nipple, left breast 2 o'clock 5 cm from nipple, abnormal left axillary lymph nodes.  Marland Kitchen GERD (gastroesophageal reflux disease)   . Hyperglycemia   . Hyperlipidemia   . Obesity   . Postmenopausal     PAST SURGICAL HISTORY: Past Surgical History:  Procedure Laterality Date  . ABDOMINAL HYSTERECTOMY    . BREAST BIOPSY Left 09/30/2016   US biopsy of 3 areas + chemo  . ESOPHAGOGASTRODUODENOSCOPY (EGD) WITH PROPOFOL N/A 12/24/2014   Procedure: ESOPHAGOGASTRODUODENOSCOPY (EGD) WITH PROPOFOL;  Surgeon: Manya Silvas, MD;  Location: [facility] ENDOSCOPY;  Service: Endoscopy;  Laterality: N/A;  . PORTACATH PLACEMENT Right 10/27/2016   Procedure: INSERTION PORT-A-CATH;  Surgeon: Nestor Lewandowsky, MD;  Location: [facility] ORS;  Service: General;  Laterality: Right;  . TONSILECTOMY, ADENOIDECTOMY, BILATERAL MYRINGOTOMY AND TUBES      FAMILY HISTORY: Family History  Problem Relation Age of Onset  . Stroke Mother   . Stroke Father   . Cancer Neg Hx   . Depression Neg Hx     ADVANCED DIRECTIVES (Y/N):  N  HEALTH MAINTENANCE: Social History  Substance Use Topics  . Smoking status: Never Smoker  . Smokeless tobacco: Never Used  . Alcohol use No     Colonoscopy:  PAP:  Bone density:  Lipid panel:  Allergies  Allergen Reactions  . Latex Itching  . Penicillins Itching and  Rash    Has patient had a PCN reaction causing immediate rash, facial/tongue/throat swelling, SOB or lightheadedness with hypotension: Yes Has patient had a PCN reaction causing severe rash involving mucus membranes or skin necrosis: No Has patient had a PCN reaction that required hospitalization No Has patient had a PCN reaction occurring within the last 10  years: No If all of the above answers are "NO", then may proceed with Cephalosporin use.     Current Outpatient Prescriptions  Medication Sig Dispense Refill  . acetaminophen (TYLENOL) 500 MG tablet Take 1,000 mg by mouth every 6 (six) hours as needed for mild pain.    Marland Kitchen aspirin 81 MG tablet Take 81 mg by mouth daily.    Marland Kitchen atorvastatin (LIPITOR) 40 MG tablet     . Cholecalciferol (VITAMIN D) 2000 units tablet Take 2,000 Units by mouth daily. One by mouth twice a week    . ferrous sulfate 325 (65 FE) MG EC tablet Take 325 mg by mouth every other day.     . fluticasone (FLONASE) 50 MCG/ACT nasal spray Place 1 spray into both nostrils daily as needed for allergies or rhinitis.    . Multiple Vitamin (MULTIVITAMIN WITH MINERALS) TABS tablet Take 1 tablet by mouth every other day.     Marland Kitchen omeprazole (PRILOSEC) 20 MG capsule Take 20 mg by mouth daily as needed (heartburn).     . ondansetron (ZOFRAN) 8 MG tablet Take 1 tablet (8 mg total) by mouth 2 (two) times daily as needed for refractory nausea / vomiting. 60 tablet 2  . prochlorperazine (COMPAZINE) 10 MG tablet Take 1 tablet (10 mg total) by mouth every 6 (six) hours as needed (Nausea or vomiting). 60 tablet 2   No current facility-administered medications for this visit.    Facility-Administered Medications Ordered in Other Visits  Medication Dose Route Frequency Provider Last Rate Last Dose  . heparin lock flush 100 unit/mL  500 Units Intravenous Once Lloyd Huger, MD      . heparin lock flush 100 unit/mL  500 Units Intracatheter Once PRN Lloyd Huger, MD      . trastuzumab (HERCEPTIN) 450 mg in sodium chloride 0.9 % 250 mL chemo infusion  450 mg Intravenous Once Lloyd Huger, MD        OBJECTIVE: Vitals:   03/31/17 0901  BP: (!) 149/72  Pulse: 86  Resp: 18  Temp: 97.7 F (36.5 C)     Body mass index is 30.06 kg/m.    ECOG FS:0 - Asymptomatic  General: Well-developed, well-nourished, no acute distress. Eyes:  Pink conjunctiva, anicteric sclera. Breasts: Easily palpable left breast mass. Exam deferred today. Chest wall: Port site without erythema or induration. Lungs: Clear to auscultation bilaterally. Heart: Regular rate and rhythm. No rubs, murmurs, or gallops. Abdomen: Soft, nontender, nondistended. No organomegaly noted, normoactive bowel sounds. Musculoskeletal: No edema, cyanosis, or clubbing. No edema or obvious deformity to right ankle, right/left hip.  Neuro: Alert, answering all questions appropriately. Cranial nerves grossly intact. Skin: No rashes or petechiae noted. Psych: Normal affect.  LAB RESULTS:  Lab Results  Component Value Date   NA 143 03/31/2017   K 3.6 03/31/2017   CL 113 (H) 03/31/2017   CO2 23 03/31/2017   GLUCOSE 105 (H) 03/31/2017   BUN 19 03/31/2017   CREATININE 1.31 (H) 03/31/2017   CALCIUM 9.4 03/31/2017   PROT 6.9 03/31/2017   ALBUMIN 3.7 03/31/2017   AST 22 03/31/2017   ALT 23 03/31/2017  ALKPHOS 118 03/31/2017   BILITOT 0.5 03/31/2017   GFRNONAA 38 (L) 03/31/2017   GFRAA 44 (L) 03/31/2017    Lab Results  Component Value Date   WBC 4.3 03/31/2017   NEUTROABS 2.3 03/31/2017   HGB 9.3 (L) 03/31/2017   HCT 27.0 (L) 03/31/2017   MCV 99.0 03/31/2017   PLT 223 03/31/2017     STUDIES: Nm Pet Image Restag (ps) Skull Base To Thigh  Result Date: 03/17/2017 CLINICAL DATA:  Subsequent treatment strategy for left breast cancer. EXAM: NUCLEAR MEDICINE PET SKULL BASE TO THIGH TECHNIQUE: 11.7 mCi F-18 FDG was injected intravenously. Full-ring PET imaging was performed from the skull base to thigh after the radiotracer. CT data was obtained and used for attenuation correction and anatomic localization. FASTING BLOOD GLUCOSE:  Value: 74 mg/dl COMPARISON:  10/20/2016. FINDINGS: NECK: No hypermetabolic lymph nodes in the neck. CT images show no acute findings. CHEST: No hypermetabolic mediastinal, hilar or axillary lymph nodes. No hypermetabolic pulmonary  nodules. Right IJ Port-A-Cath terminates in the left brachiocephalic vein. Atherosclerotic calcification of the arterial vasculature, including coronary arteries. Heart is mildly enlarged. Decreased attenuation of the intravascular compartment is indicative of anemia. No pericardial or pleural effusion. ABDOMEN/PELVIS: No abnormal hypermetabolism in the liver, adrenal glands, spleen or pancreas. No hypermetabolic lymph nodes. Liver, gallbladder, adrenal glands otherwise unremarkable. Low-attenuation lesions in the right kidney measure up to 1.9 cm and are difficult to further characterize without post-contrast imaging. Spleen, pancreas, stomach and bowel are grossly unremarkable. Atherosclerotic calcification of the arterial vasculature. SKELETON: No abnormal osseous hypermetabolism. Degenerative changes in the spine. IMPRESSION: 1. No evidence of metastatic disease. 2. Right IJ Port-A-Cath terminates in the proximal left brachiocephalic vein. These results will be called to the ordering clinician or representative by the Radiologist Assistant, and communication documented in the PACS or zVision Dashboard. 3. Aortic atherosclerosis (ICD10-170.0). Coronary artery calcification. Electronically Signed   By: Lorin Picket M.D.   On: 03/17/2017 14:45   US Breast Ltd Uni Left Inc Axilla  Result Date: 03/23/2017 CLINICAL DATA:  Evaluate response to neoadjuvant chemotherapy. The patient has biopsy proven malignancy in the 2 o'clock position of the left breast at 2 cm and 5 cm from the nipple and has biopsy proven metastasis to a left axillary lymph node. EXAM: 2D DIGITAL DIAGNOSTIC LEFT MAMMOGRAM WITH CAD AND ADJUNCT TOMO ULTRASOUND LEFT BREAST COMPARISON:  09/30/2016 and 09/22/2016 ACR Breast Density Category b: There are scattered areas of fibroglandular density. FINDINGS: A wing shaped biopsy clip remains present within the biopsy proven malignancy in the 2 o'clock position of the left breast approximately 2 cm from  the nipple. The mass is markedly decreased in density and has decreased in bulkiness compared to the mammograms of March 2018. A mass/distortion does remain visible, however. In the 2 o'clock position of the left breast approximately 5 cm from the nipple the second and smaller mass has also decreased in size and density, with a ribbon shaped biopsy clip present. These 2 biopsy clips are separated by approximately 4.5 cm. No visible lymphadenopathy seen in the left axilla. The biopsied lymph node is identified the presence of a HydroMARK clip within it. On today's mammogram this lymph node measures 9 mm. There is no skin thickening. Mammographic images were processed with CAD. On physical exam, no mass is palpated in the 2 o'clock region of the left breast. Targeted ultrasound is performed, showing an ill-defined hypoechoic mass in the 2 o'clock position 2 cm from nipple with central biopsy  clip. This mass is much less bulky in appearance, more oblong, and demonstrates less vascularity than on the previous ultrasound of March 2018. Currently, the hypoechoic tissue in this region measures 3.2 x 2.0 x 1.1 cm. Previously on the ultrasound of September 22, 2016 this mass measured 3.3 x 2.3 x 2.8 cm. In the 2 o'clock position 5 cm from the nipple is a small residual hypoechoic mass, the borders of which are difficult to delineate, but measures approximately 0.7 x 0.5 x 0.5 cm and shows no internal vascular flow. On the previous ultrasound of March 2018 this mass measured 0.6 x 0.5 x 0.4 cm. Ultrasound of the left axilla shows normal appearing left axillary lymph nodes. IMPRESSION: Mammogram and ultrasound imaging demonstrates a positive response to neoadjuvant chemotherapy of both masses in the 2 o'clock region. Normal interval resolution of left axillary lymphadenopathy. RECOMMENDATION: Treatment planning for known left breast cancer and left axillary node metastasis. I have discussed the findings and recommendations with the  patient. Results were also provided in writing at the conclusion of the visit. If applicable, a reminder letter will be sent to the patient regarding the next appointment. BI-RADS CATEGORY  6: Known biopsy-proven malignancy. Electronically Signed   By: Britta Mccreedy M.D.   On: 03/23/2017 16:15   Mm Diag Breast Tomo Uni Left  Result Date: 03/23/2017 CLINICAL DATA:  Evaluate response to neoadjuvant chemotherapy. The patient has biopsy proven malignancy in the 2 o'clock position of the left breast at 2 cm and 5 cm from the nipple and has biopsy proven metastasis to a left axillary lymph node. EXAM: 2D DIGITAL DIAGNOSTIC LEFT MAMMOGRAM WITH CAD AND ADJUNCT TOMO ULTRASOUND LEFT BREAST COMPARISON:  09/30/2016 and 09/22/2016 ACR Breast Density Category b: There are scattered areas of fibroglandular density. FINDINGS: A wing shaped biopsy clip remains present within the biopsy proven malignancy in the 2 o'clock position of the left breast approximately 2 cm from the nipple. The mass is markedly decreased in density and has decreased in bulkiness compared to the mammograms of March 2018. A mass/distortion does remain visible, however. In the 2 o'clock position of the left breast approximately 5 cm from the nipple the second and smaller mass has also decreased in size and density, with a ribbon shaped biopsy clip present. These 2 biopsy clips are separated by approximately 4.5 cm. No visible lymphadenopathy seen in the left axilla. The biopsied lymph node is identified the presence of a HydroMARK clip within it. On today's mammogram this lymph node measures 9 mm. There is no skin thickening. Mammographic images were processed with CAD. On physical exam, no mass is palpated in the 2 o'clock region of the left breast. Targeted ultrasound is performed, showing an ill-defined hypoechoic mass in the 2 o'clock position 2 cm from nipple with central biopsy clip. This mass is much less bulky in appearance, more oblong, and  demonstrates less vascularity than on the previous ultrasound of March 2018. Currently, the hypoechoic tissue in this region measures 3.2 x 2.0 x 1.1 cm. Previously on the ultrasound of September 22, 2016 this mass measured 3.3 x 2.3 x 2.8 cm. In the 2 o'clock position 5 cm from the nipple is a small residual hypoechoic mass, the borders of which are difficult to delineate, but measures approximately 0.7 x 0.5 x 0.5 cm and shows no internal vascular flow. On the previous ultrasound of March 2018 this mass measured 0.6 x 0.5 x 0.4 cm. Ultrasound of the left axilla shows normal appearing  left axillary lymph nodes. IMPRESSION: Mammogram and ultrasound imaging demonstrates a positive response to neoadjuvant chemotherapy of both masses in the 2 o'clock region. Normal interval resolution of left axillary lymphadenopathy. RECOMMENDATION: Treatment planning for known left breast cancer and left axillary node metastasis. I have discussed the findings and recommendations with the patient. Results were also provided in writing at the conclusion of the visit. If applicable, a reminder letter will be sent to the patient regarding the next appointment. BI-RADS CATEGORY  6: Known biopsy-proven malignancy. Electronically Signed   By: Curlene Dolphin M.D.   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  PLAN:    1. 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 Patient will now require surgery followed by adjuvant XRT. She initially refused a referral back to surgery, but now has agreed to be evaluated. Patient will also receive Herceptin only every 3 weeks for an entire year after her surgery. An aromatase inhibitor would not offer benefit given  the ER/PR negativity of her disease. Repeat MUGA scan on January 22, 2017 revealed an EF of 55% which was 10% less than previous. Continue to monitor closely. Repeat in October 2018. PET scan results reviewed independently with no obvious metastatic disease other than her known disease in breast and axilla. Proceed with cycle 7 of Herceptin today. Return to clinic in 3 weeks for consideration of cycle 8 of Herceptin only.  2. Anemia: Secondary chemotherapy, monitor. 3. Leukocytosis: Resolved. Monitor.  4. Renal insufficiency: Creatinine is only mildly elevated today, monitor. 5. Hypertension: Blood pressure is mildly elevated today. Continue to monitor. 6. Anxiety: Improved. Continue close follow-up with navigator. 7. Pain in hips/ankle: Continue to monitor for now. She states that she has had the hip pain for a long time.  8. Weight loss of 9 lbs: Explained to her the importance of eating and drinking high protein meals. Encouraged supplements such as Boosts and Ensures. She is agreeable.   Patient expressed understanding and was in agreement with this plan. She also understands that She can call clinic at any time with any questions, concerns, or complaints.   Cancer Staging Malignant neoplasm of upper-outer quadrant of left female breast [facility]) Staging form: Breast, AJCC 8th Edition - Clinical stage from 10/11/2016: Stage IIB (cT2, cN1, cM0, G3, ER: Negative, PR: Negative, HER2: Positive) - Signed by Lloyd Huger, MD on 10/11/2016   Jacquelin Hawking, NP 03/31/17 10:11 AM

## 2017-03-31 ENCOUNTER — Inpatient Hospital Stay: Payer: Medicare Other | Attending: Oncology

## 2017-03-31 ENCOUNTER — Inpatient Hospital Stay (HOSPITAL_BASED_OUTPATIENT_CLINIC_OR_DEPARTMENT_OTHER): Payer: Medicare Other | Admitting: Oncology

## 2017-03-31 ENCOUNTER — Inpatient Hospital Stay: Payer: Medicare Other

## 2017-03-31 VITALS — BP 149/72 | HR 86 | Temp 97.7°F | Resp 18 | Wt 159.1 lb

## 2017-03-31 DIAGNOSIS — M25551 Pain in right hip: Secondary | ICD-10-CM | POA: Insufficient documentation

## 2017-03-31 DIAGNOSIS — Z79899 Other long term (current) drug therapy: Secondary | ICD-10-CM | POA: Insufficient documentation

## 2017-03-31 DIAGNOSIS — R739 Hyperglycemia, unspecified: Secondary | ICD-10-CM | POA: Diagnosis not present

## 2017-03-31 DIAGNOSIS — E669 Obesity, unspecified: Secondary | ICD-10-CM | POA: Diagnosis not present

## 2017-03-31 DIAGNOSIS — Z7982 Long term (current) use of aspirin: Secondary | ICD-10-CM | POA: Diagnosis not present

## 2017-03-31 DIAGNOSIS — K219 Gastro-esophageal reflux disease without esophagitis: Secondary | ICD-10-CM

## 2017-03-31 DIAGNOSIS — I10 Essential (primary) hypertension: Secondary | ICD-10-CM

## 2017-03-31 DIAGNOSIS — M2550 Pain in unspecified joint: Secondary | ICD-10-CM | POA: Diagnosis not present

## 2017-03-31 DIAGNOSIS — M791 Myalgia: Secondary | ICD-10-CM

## 2017-03-31 DIAGNOSIS — Z171 Estrogen receptor negative status [ER-]: Secondary | ICD-10-CM | POA: Diagnosis not present

## 2017-03-31 DIAGNOSIS — E785 Hyperlipidemia, unspecified: Secondary | ICD-10-CM

## 2017-03-31 DIAGNOSIS — M25552 Pain in left hip: Secondary | ICD-10-CM

## 2017-03-31 DIAGNOSIS — M25571 Pain in right ankle and joints of right foot: Secondary | ICD-10-CM | POA: Insufficient documentation

## 2017-03-31 DIAGNOSIS — F419 Anxiety disorder, unspecified: Secondary | ICD-10-CM | POA: Diagnosis not present

## 2017-03-31 DIAGNOSIS — D6481 Anemia due to antineoplastic chemotherapy: Secondary | ICD-10-CM

## 2017-03-31 DIAGNOSIS — Z5112 Encounter for antineoplastic immunotherapy: Secondary | ICD-10-CM | POA: Insufficient documentation

## 2017-03-31 DIAGNOSIS — C50412 Malignant neoplasm of upper-outer quadrant of left female breast: Secondary | ICD-10-CM | POA: Insufficient documentation

## 2017-03-31 DIAGNOSIS — N2889 Other specified disorders of kidney and ureter: Secondary | ICD-10-CM | POA: Diagnosis not present

## 2017-03-31 DIAGNOSIS — R634 Abnormal weight loss: Secondary | ICD-10-CM | POA: Insufficient documentation

## 2017-03-31 LAB — COMPREHENSIVE METABOLIC PANEL
ALT: 23 U/L (ref 14–54)
AST: 22 U/L (ref 15–41)
Albumin: 3.7 g/dL (ref 3.5–5.0)
Alkaline Phosphatase: 118 U/L (ref 38–126)
Anion gap: 7 (ref 5–15)
BILIRUBIN TOTAL: 0.5 mg/dL (ref 0.3–1.2)
BUN: 19 mg/dL (ref 6–20)
CHLORIDE: 113 mmol/L — AB (ref 101–111)
CO2: 23 mmol/L (ref 22–32)
CREATININE: 1.31 mg/dL — AB (ref 0.44–1.00)
Calcium: 9.4 mg/dL (ref 8.9–10.3)
GFR, EST AFRICAN AMERICAN: 44 mL/min — AB (ref >60)
GFR, EST NON AFRICAN AMERICAN: 38 mL/min — AB (ref >60)
Glucose, Bld: 105 mg/dL — ABNORMAL HIGH (ref 65–99)
Potassium: 3.6 mmol/L (ref 3.5–5.1)
Sodium: 143 mmol/L (ref 135–145)
TOTAL PROTEIN: 6.9 g/dL (ref 6.5–8.1)

## 2017-03-31 LAB — CBC WITH DIFFERENTIAL/PLATELET
BASOS ABS: 0 10*3/uL (ref 0–0.1)
Basophils Relative: 1 %
EOS PCT: 1 %
Eosinophils Absolute: 0.1 10*3/uL (ref 0–0.7)
HEMATOCRIT: 27 % — AB (ref 35.0–47.0)
Hemoglobin: 9.3 g/dL — ABNORMAL LOW (ref 12.0–16.0)
LYMPHS ABS: 1.6 10*3/uL (ref 1.0–3.6)
LYMPHS PCT: 38 %
MCH: 33.9 pg (ref 26.0–34.0)
MCHC: 34.3 g/dL (ref 32.0–36.0)
MCV: 99 fL (ref 80.0–100.0)
MONO ABS: 0.3 10*3/uL (ref 0.2–0.9)
Monocytes Relative: 7 %
NEUTROS ABS: 2.3 10*3/uL (ref 1.4–6.5)
Neutrophils Relative %: 53 %
PLATELETS: 223 10*3/uL (ref 150–440)
RBC: 2.73 MIL/uL — ABNORMAL LOW (ref 3.80–5.20)
RDW: 17.2 % — AB (ref 11.5–14.5)
WBC: 4.3 10*3/uL (ref 3.6–11.0)

## 2017-03-31 MED ORDER — ACETAMINOPHEN 325 MG PO TABS
650.0000 mg | ORAL_TABLET | Freq: Once | ORAL | Status: AC
  Administered 2017-03-31: 650 mg via ORAL
  Filled 2017-03-31: qty 2

## 2017-03-31 MED ORDER — HEPARIN SOD (PORK) LOCK FLUSH 100 UNIT/ML IV SOLN
500.0000 [IU] | Freq: Once | INTRAVENOUS | Status: AC
  Administered 2017-03-31: 500 [IU] via INTRAVENOUS
  Filled 2017-03-31: qty 5

## 2017-03-31 MED ORDER — DIPHENHYDRAMINE HCL 25 MG PO CAPS
25.0000 mg | ORAL_CAPSULE | Freq: Once | ORAL | Status: AC
  Administered 2017-03-31: 25 mg via ORAL
  Filled 2017-03-31: qty 1

## 2017-03-31 MED ORDER — SODIUM CHLORIDE 0.9 % IV SOLN
Freq: Once | INTRAVENOUS | Status: AC
  Administered 2017-03-31: 10:00:00 via INTRAVENOUS
  Filled 2017-03-31: qty 1000

## 2017-03-31 MED ORDER — SODIUM CHLORIDE 0.9% FLUSH
10.0000 mL | Freq: Once | INTRAVENOUS | Status: AC
  Administered 2017-03-31: 10 mL via INTRAVENOUS
  Filled 2017-03-31: qty 10

## 2017-03-31 MED ORDER — HEPARIN SOD (PORK) LOCK FLUSH 100 UNIT/ML IV SOLN: 500.0000 [IU] | Freq: Once | INTRAVENOUS | Status: DC | PRN

## 2017-03-31 MED ORDER — TRASTUZUMAB CHEMO 150 MG IV SOLR
450.0000 mg | Freq: Once | INTRAVENOUS | Status: AC
  Administered 2017-03-31: 450 mg via INTRAVENOUS
  Filled 2017-03-31: qty 21.43

## 2017-04-01 ENCOUNTER — Encounter: Payer: Self-pay | Admitting: Surgery

## 2017-04-01 ENCOUNTER — Ambulatory Visit (INDEPENDENT_AMBULATORY_CARE_PROVIDER_SITE_OTHER): Payer: Medicare Other | Admitting: Surgery

## 2017-04-01 VITALS — BP 150/78 | HR 109 | Temp 98.2°F | Ht 61.0 in | Wt 159.6 lb

## 2017-04-01 DIAGNOSIS — C50412 Malignant neoplasm of upper-outer quadrant of left female breast: Secondary | ICD-10-CM

## 2017-04-01 DIAGNOSIS — Z171 Estrogen receptor negative status [ER-]: Secondary | ICD-10-CM | POA: Diagnosis not present

## 2017-04-01 NOTE — Patient Instructions (Addendum)
We have spoken today about breast surgery. Your Mastectomy has been scheduled for 9/26 at [facility] with Dr. Hampton Abbot.  You will most likely spend at least 1 night in the hospital following surgery and go home with 2-4 drains for approximately 5-7 days following your surgery. Please keep an accurate record of your drain amount in ml's or cc's. If your drain suddenly stops draining or has drainage around the tube at the skin, call our office and speak with a nurse immediately.  Please see the (blue) pre-care surgery sheet that you have been given today for more information regarding your surgery.  Information regarding your surgery has been provided below. If you have any questions or concerns, please call our office and speak with a nurse.  Patient advised to stop aspirin 5 days prior to having surgery.   Total or Modified Radical Mastectomy A total mastectomy and a modified radical mastectomy are types of surgery for breast cancer. If you are having a total mastectomy (simple mastectomy), your entire breast will be removed. If you are having a modified radical mastectomy, your breast and nipple will be removed along with the lymph nodes under your arm. You may also have some of the lining over the muscle tissues under your breast removed. LET [facility] CARE PROVIDER KNOW ABOUT:  Any allergies you have.  All medicines you are taking, including vitamins, herbs, eye drops, creams, and over-the-counter medicines.  Previous problems you or members of your family have had with the use of anesthetics.  Any blood disorders you have.  Previous surgeries you have had.  Medical conditions you have. RISKS AND COMPLICATIONS Generally, this is a safe procedure. However, problems may occur, including:  Pain.  Infection.  Bleeding.  Scar tissue.  Chest numbness on the side of the surgery.  Fluid buildup under the skin flaps where your breast was removed (seroma).  Sensation of throbbing or  tingling.  Stress or sadness from losing your breast. If you have the lymph nodes under your arm removed, you may have arm swelling, weakness, or numbness on the same side of your body as your surgery. BEFORE THE PROCEDURE  Ask your health care provider about:  Changing or stopping your regular medicines. This is especially important if you are taking diabetes medicines or blood thinners.  Taking medicines such as aspirin and ibuprofen. These medicines can thin your blood. Do not take these medicines before your procedure if your health care provider instructs you not to.  Follow your health care provider's instructions about eating or drinking restrictions.  Plan to have someone take you home after the procedure. PROCEDURE  An IV tube will be inserted into one of your veins.  You will be given a medicine that makes you fall asleep (general anesthetic).  Your breast will be cleaned with a germ-killing solution (antiseptic).  A wide incision will be made around your nipple. The skin and nipple inside the incision will be removed along with all breast tissue.  If you are having a modified radical mastectomy:  The lining over your chest muscles will be removed.  The incision may be extended to reach the lymph nodes under your arm, or a second incision may be made.  The lymph nodes will be removed.  You may have a drainage tube inserted into your incision to collect fluid that builds up after surgery. This tube is connected to a suction bulb.  Your incision or incisions will be closed with stitches (sutures).  A bandage (  dressing) will be placed over your breast and under your arm. The procedure may vary among health care providers and hospitals. AFTER THE PROCEDURE  You will be moved to a recovery area.  Your blood pressure, heart rate, breathing rate, and blood oxygen level will be monitored often until the medicines you were given have worn off.  You will be given pain  medicine as needed.  After a while, you will be taken to a hospital room.  You will be encouraged to get up and walk as soon as you can.  Your IV tube can be removed when you are able to eat and drink.  Your drain may be removed before you go home from the hospital, or you may be sent home with your drain and suction bulb.   This information is not intended to replace advice given to you by your health care provider. Make sure you discuss any questions you have with your health care provider.   Document Released: 03/31/2001 Document Revised: 07/27/2014 Document Reviewed: 03/21/2014 Elsevier Interactive Patient Education [employer].

## 2017-04-01 NOTE — Progress Notes (Signed)
04/01/2017  History of Present Illness: Maria Patterson is a 77 y.o. 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  She presents for discussion for surgery.  She has finished chemotherapy with Dr. Grayland Ormond.  She had a follow up PET scan as well as mammogram/ultrasound.  Her PET scan shows no activity of the left axillary lymph nodes.  Her mammogram and ultrasound show improvement in the two masses of the left breast.  She denies any changes in her breast or axilla.  Past Medical History: Past Medical History:  Diagnosis Date  . Breast cancer ([place])   . Breast mass, left 09/22/2016   RECOMMENDATION: Ultrasound-guided core biopsies of masses at left breast 2 o'clock 2 cm from nipple, left breast 2 o'clock 5 cm from nipple, abnormal left axillary lymph nodes.  Marland Kitchen GERD (gastroesophageal reflux disease)   . Hyperglycemia   . Hyperlipidemia   . Obesity   . Postmenopausal      Past Surgical History: Past Surgical History:  Procedure Laterality Date  . ABDOMINAL HYSTERECTOMY    . BREAST BIOPSY Left 09/30/2016   US biopsy of 3 areas + chemo  . ESOPHAGOGASTRODUODENOSCOPY (EGD) WITH PROPOFOL N/A 12/24/2014   Procedure: ESOPHAGOGASTRODUODENOSCOPY (EGD) WITH PROPOFOL;  Surgeon: Manya Silvas, MD;  Location: [facility] ENDOSCOPY;  Service: Endoscopy;  Laterality: N/A;  . PORTACATH PLACEMENT Right 10/27/2016   Procedure: INSERTION PORT-A-CATH;  Surgeon: Nestor Lewandowsky, MD;  Location: [facility] ORS;  Service: General;  Laterality: Right;  . TONSILECTOMY, ADENOIDECTOMY, BILATERAL MYRINGOTOMY AND TUBES      Home Medications: Prior to Admission medications   Medication Sig Start Date End Date Taking? Authorizing Provider  acetaminophen (TYLENOL) 500 MG tablet Take 1,000 mg by mouth every 6 (six) hours as needed for mild pain.   Yes [provider]  aspirin 81 MG tablet Take 81 mg by mouth daily.   Yes [provider]  atorvastatin (LIPITOR) 40 MG tablet  12/31/16  Yes  [provider]  Cholecalciferol (VITAMIN D) 2000 units tablet Take 2,000 Units by mouth daily. One by mouth twice a week 09/01/16  Yes Lada, Satira Anis, MD  ferrous sulfate 325 (65 FE) MG EC tablet Take 325 mg by mouth every other day.  09/01/16  Yes Lada, Satira Anis, MD  fluticasone (FLONASE) 50 MCG/ACT nasal spray Place 1 spray into both nostrils daily as needed for allergies or rhinitis.   Yes [provider]  Multiple Vitamin (MULTIVITAMIN WITH MINERALS) TABS tablet Take 1 tablet by mouth every other day.  09/01/16  Yes Lada, Satira Anis, MD  omeprazole (PRILOSEC) 20 MG capsule Take 20 mg by mouth daily as needed (heartburn).    Yes [provider]  ondansetron (ZOFRAN) 8 MG tablet Take 1 tablet (8 mg total) by mouth 2 (two) times daily as needed for refractory nausea / vomiting. 10/20/16  Yes Lloyd Huger, MD  prochlorperazine (COMPAZINE) 10 MG tablet Take 1 tablet (10 mg total) by mouth every 6 (six) hours as needed (Nausea or vomiting). 10/20/16  Yes Lloyd Huger, MD    Allergies: Allergies  Allergen Reactions  . Latex Itching  . Penicillins Itching and Rash    Has patient had a PCN reaction causing immediate rash, facial/tongue/throat swelling, SOB or lightheadedness with hypotension: Yes Has patient had a PCN reaction causing severe rash involving mucus membranes or skin necrosis: No Has patient had a PCN reaction that required hospitalization No Has patient had a PCN reaction occurring within the  last 10 years: No If all of the above answers are "NO", then may proceed with Cephalosporin use.     Review of Systems: Review of Systems  Constitutional: Negative for chills and fever.  Respiratory: Negative for shortness of breath.   Cardiovascular: Negative for chest pain.  Gastrointestinal: Negative for abdominal pain and nausea.    Physical Exam BP (!) 150/78   Pulse (!) 109   Temp 98.2 F (36.8 C) (Oral)   Ht 5\' 1"  (1.549 m)   Wt 72.4 kg  (159 lb 9.6 oz)   BMI 30.16 kg/m  CONSTITUTIONAL: No acute distress HEENT:  Normocephalic, atraumatic, extraocular motion intact. RESPIRATORY:  Normal respiratory effort without pathologic use of accessory muscles. CARDIOVASCULAR: Heart is regular without murmurs, gallops, or rubs. BREAST:  Deferred GI: The abdomen is soft, nondistended, nontender.  NEUROLOGIC:  Motor and sensation is grossly normal.  Cranial nerves are grossly intact. PSYCH:  Alert and oriented to person, place and time. Affect is normal.  Labs/Imaging: PET/CT:   IMPRESSION: 1. No evidence of metastatic disease. 2. Right IJ Port-A-Cath terminates in the proximal left brachiocephalic vein. These results will be called to the ordering clinician or representative by the Radiologist Assistant, and communication documented in the PACS or zVision Dashboard. 3. Aortic atherosclerosis (ICD10-170.0). Coronary artery calcification.  Mammogram/Ultrasound: Mammogram and ultrasound imaging demonstrates a positive response to neoadjuvant chemotherapy of both masses in the 2 o'clock region.  Normal interval resolution of left axillary lymphadenopathy.   Assessment and Plan: This is a 76 y.o. female who presents with left breast invasive cancer spread to lymph nodes, finished neoadjuvant chemotherapy.  Discussed the role for surgery and the plan for a left mastectomy as well as a left axillary sentinel node biopsy.  Given that she has two separate masses and the breast size, would not recommend in favor of breast conserving surgery.  Discussed post-op outcomes and expectations for overnight stay and two drains in place under skin flaps, incision for the sentinel node, outpatient follow up.  Discussed risks including risk of bleeding, infection, and injury to surrounding structures.  She is willing to proceed. She understands that proceeding with surgery is recommended and that this would improve her chance of survival.  She knows to  hold ASA for 3 days prior to surgery.  She will be scheduled for 9/26.  Face-to-face time spent with the patient and care providers was 25 minutes, with more than 50% of the time spent counseling, educating, and coordinating care of the patient.     Melvyn Neth, [place]

## 2017-04-02 ENCOUNTER — Telehealth: Payer: Self-pay

## 2017-04-02 ENCOUNTER — Emergency Department
Admission: EM | Admit: 2017-04-02 | Discharge: 2017-04-02 | Disposition: A | Discharge status: Home / Self Care | Payer: Medicare Other | Attending: Emergency Medicine | Admitting: Emergency Medicine

## 2017-04-02 ENCOUNTER — Emergency Department: Payer: Medicare Other

## 2017-04-02 ENCOUNTER — Encounter: Payer: Self-pay | Admitting: Emergency Medicine

## 2017-04-02 DIAGNOSIS — I10 Essential (primary) hypertension: Secondary | ICD-10-CM | POA: Diagnosis not present

## 2017-04-02 DIAGNOSIS — R1031 Right lower quadrant pain: Secondary | ICD-10-CM | POA: Diagnosis not present

## 2017-04-02 DIAGNOSIS — C50412 Malignant neoplasm of upper-outer quadrant of left female breast: Secondary | ICD-10-CM | POA: Insufficient documentation

## 2017-04-02 DIAGNOSIS — Z79899 Other long term (current) drug therapy: Secondary | ICD-10-CM | POA: Insufficient documentation

## 2017-04-02 DIAGNOSIS — Z7982 Long term (current) use of aspirin: Secondary | ICD-10-CM | POA: Diagnosis not present

## 2017-04-02 LAB — COMPREHENSIVE METABOLIC PANEL
ALBUMIN: 3.8 g/dL (ref 3.5–5.0)
ALT: 23 U/L (ref 14–54)
ANION GAP: 8 (ref 5–15)
AST: 30 U/L (ref 15–41)
Alkaline Phosphatase: 121 U/L (ref 38–126)
BILIRUBIN TOTAL: 0.3 mg/dL (ref 0.3–1.2)
BUN: 19 mg/dL (ref 6–20)
CHLORIDE: 111 mmol/L (ref 101–111)
CO2: 22 mmol/L (ref 22–32)
Calcium: 9.5 mg/dL (ref 8.9–10.3)
Creatinine, Ser: 1.34 mg/dL — ABNORMAL HIGH (ref 0.44–1.00)
GFR calc Af Amer: 43 mL/min — ABNORMAL LOW (ref >60)
GFR calc non Af Amer: 37 mL/min — ABNORMAL LOW (ref >60)
GLUCOSE: 92 mg/dL (ref 65–99)
POTASSIUM: 4 mmol/L (ref 3.5–5.1)
Sodium: 141 mmol/L (ref 135–145)
TOTAL PROTEIN: 7.2 g/dL (ref 6.5–8.1)

## 2017-04-02 LAB — CBC
HEMATOCRIT: 29.8 % — AB (ref 35.0–47.0)
HEMOGLOBIN: 10.2 g/dL — AB (ref 12.0–16.0)
MCH: 33.7 pg (ref 26.0–34.0)
MCHC: 34.1 g/dL (ref 32.0–36.0)
MCV: 98.7 fL (ref 80.0–100.0)
Platelets: 219 10*3/uL (ref 150–440)
RBC: 3.02 MIL/uL — ABNORMAL LOW (ref 3.80–5.20)
RDW: 17 % — ABNORMAL HIGH (ref 11.5–14.5)
WBC: 4.2 10*3/uL (ref 3.6–11.0)

## 2017-04-02 LAB — URINALYSIS, COMPLETE (UACMP) WITH MICROSCOPIC
BACTERIA UA: NONE SEEN
BILIRUBIN URINE: NEGATIVE
Glucose, UA: NEGATIVE mg/dL
HGB URINE DIPSTICK: NEGATIVE
Ketones, ur: NEGATIVE mg/dL
LEUKOCYTES UA: NEGATIVE
NITRITE: NEGATIVE
PROTEIN: NEGATIVE mg/dL
RBC / HPF: NONE SEEN RBC/hpf (ref 0–5)
Specific Gravity, Urine: 1.017 (ref 1.005–1.030)
pH: 5 (ref 5.0–8.0)

## 2017-04-02 LAB — LIPASE, BLOOD: LIPASE: 32 U/L (ref 11–51)

## 2017-04-02 MED ORDER — IOPAMIDOL (ISOVUE-300) INJECTION 61%
75.0000 mL | Freq: Once | INTRAVENOUS | Status: AC | PRN
  Administered 2017-04-02: 75 mL via INTRAVENOUS

## 2017-04-02 MED ORDER — IOPAMIDOL (ISOVUE-300) INJECTION 61%
30.0000 mL | Freq: Once | INTRAVENOUS | Status: AC | PRN
  Administered 2017-04-02: 30 mL via ORAL

## 2017-04-02 MED ORDER — SODIUM CHLORIDE 0.9 % IV BOLUS (SEPSIS)
1000.0000 mL | Freq: Once | INTRAVENOUS | Status: AC
  Administered 2017-04-02: 1000 mL via INTRAVENOUS

## 2017-04-02 NOTE — ED Triage Notes (Signed)
Pt c/o RLQ pain that radiates to right groin.  Has had nausea but no vomiting/diarrhea. No urinary sx or fevers. Ambulatory to triage without difficulty.  NAD, VSS

## 2017-04-02 NOTE — ED Provider Notes (Signed)
[facility] Emergency Department Provider Note  ____________________________________________   First MD Initiated Contact with Patient 04/02/17 1156     (approximate)  I have reviewed the triage vital signs and the nursing notes.   HISTORY  Chief Complaint Abdominal Pain   HPI Maria Patterson is a 76 y.o. female with a history of left-sided breast cancer on chemotherapy who is presenting to the emergency department today with right lower quadrant abdominal pain which she describes a burning pain. She says it is a 7 out of 10 and has been ongoing over the past 3 years 4 days. She denies any radiation of the pain. Denies any burning with urination. Denies any rash. She says that she has been nauseous but without any vomiting or diarrhea. Does not report any burning with urination, vaginal bleeding or discharge. Denies any trauma or strain of the region.   Past Medical History:  Diagnosis Date  . Breast cancer ([place])   . Breast mass, left 09/22/2016   RECOMMENDATION: Ultrasound-guided core biopsies of masses at left breast 2 o'clock 2 cm from nipple, left breast 2 o'clock 5 cm from nipple, abnormal left axillary lymph nodes.  Marland Kitchen GERD (gastroesophageal reflux disease)   . Hyperglycemia   . Hyperlipidemia   . Obesity   . Postmenopausal     Patient Active Problem List   Diagnosis Date Noted  . Tachycardia 02/01/2017  . Anemia 02/01/2017  . Atherosclerosis of native arteries of extremity with intermittent claudication ([place]) 11/09/2016  . Essential hypertension, benign 11/07/2016  . Allergic rhinitis 11/07/2016  . Malignant neoplasm of upper-outer quadrant of left female breast ([place]) 10/08/2016  . Breast mass, left 09/22/2016  . Encounter for screening for HIV 09/18/2016  . Osteoporosis screening 09/18/2016  . Postmenopausal 09/18/2016  . Ache in joint 03/09/2016  . Overweight 03/09/2016  . Medication monitoring encounter 10/30/2015  . Hyperglycemia  09/10/2015  . GERD without esophagitis 07/01/2012  . Hyperlipidemia LDL goal <100 07/01/2012    Past Surgical History:  Procedure Laterality Date  . ABDOMINAL HYSTERECTOMY    . BREAST BIOPSY Left 09/30/2016   US biopsy of 3 areas + chemo  . ESOPHAGOGASTRODUODENOSCOPY (EGD) WITH PROPOFOL N/A 12/24/2014   Procedure: ESOPHAGOGASTRODUODENOSCOPY (EGD) WITH PROPOFOL;  Surgeon: Manya Silvas, MD;  Location: [facility] ENDOSCOPY;  Service: Endoscopy;  Laterality: N/A;  . PORTACATH PLACEMENT Right 10/27/2016   Procedure: INSERTION PORT-A-CATH;  Surgeon: Nestor Lewandowsky, MD;  Location: [facility] ORS;  Service: General;  Laterality: Right;  . TONSILECTOMY, ADENOIDECTOMY, BILATERAL MYRINGOTOMY AND TUBES      Prior to Admission medications   Medication Sig Start Date End Date Taking? Authorizing Provider  aspirin 81 MG tablet Take 81 mg by mouth daily.   Yes [provider]  atorvastatin (LIPITOR) 40 MG tablet Take 40 mg by mouth daily.  12/31/16  Yes [provider]  Cholecalciferol (VITAMIN D) 2000 units tablet Take 2,000 Units by mouth daily. One by mouth twice a week 09/01/16  Yes Lada, Satira Anis, MD  ferrous sulfate 325 (65 FE) MG EC tablet Take 325 mg by mouth every other day.  09/01/16  Yes Lada, Satira Anis, MD  Multiple Vitamin (MULTIVITAMIN WITH MINERALS) TABS tablet Take 1 tablet by mouth every other day.  09/01/16  Yes Lada, Satira Anis, MD  Polyethyl Glycol-Propyl Glycol (SYSTANE OP) Place 1 drop into both eyes daily as needed.   Yes [provider]  acetaminophen (TYLENOL) 500 MG tablet Take 1,000 mg by mouth every 6 (  six) hours as needed for mild pain.    [provider]  fluticasone (FLONASE) 50 MCG/ACT nasal spray Place 1 spray into both nostrils daily as needed for allergies or rhinitis.    [provider]  omeprazole (PRILOSEC) 20 MG capsule Take 20 mg by mouth daily as needed (heartburn).     [provider]  ondansetron (ZOFRAN) 8 MG tablet Take 1  tablet (8 mg total) by mouth 2 (two) times daily as needed for refractory nausea / vomiting. 10/20/16   Lloyd Huger, MD  prochlorperazine (COMPAZINE) 10 MG tablet Take 1 tablet (10 mg total) by mouth every 6 (six) hours as needed (Nausea or vomiting). 10/20/16   Lloyd Huger, MD    Allergies Latex and Penicillins  Family History  Problem Relation Age of Onset  . Stroke Mother   . Stroke Father   . Cancer Neg Hx   . Depression Neg Hx     Social History Social History  Substance Use Topics  . Smoking status: Never Smoker  . Smokeless tobacco: Never Used  . Alcohol use No    Review of Systems  Constitutional: No fever/chills Eyes: No visual changes. ENT: No sore throat. Cardiovascular: Denies chest pain. Respiratory: Denies shortness of breath. Gastrointestinal:  no vomiting.  No diarrhea.  No constipation. Genitourinary: Negative for dysuria. Musculoskeletal: Negative for back pain. Skin: Negative for rash. Neurological: Negative for headaches, focal weakness or numbness.   ____________________________________________   PHYSICAL EXAM:  VITAL SIGNS: ED Triage Vitals  Enc Vitals Group     BP 04/02/17 0937 (!) 160/70     Pulse Rate 04/02/17 0935 92     Resp 04/02/17 0935 18     Temp 04/02/17 0935 98.6 F (37 C)     Temp Source 04/02/17 0935 Oral     SpO2 04/02/17 0935 98 %     Weight 04/02/17 0936 159 lb (72.1 kg)     Height 04/02/17 0936 5\' 1"  (1.549 m)     Head Circumference --      Peak Flow --      Pain Score 04/02/17 0935 6     Pain Loc --      Pain Edu? --      Excl. in [place]? --     Constitutional: Alert and oriented. Well appearing and in no acute distress. Eyes: Conjunctivae are normal.  Head: Atraumatic. Nose: No congestion/rhinnorhea. Mouth/Throat: Mucous membranes are moist.  Neck: No stridor.   Cardiovascular: Normal rate, regular rhythm. Grossly normal heart sounds.   Respiratory: Normal respiratory effort.  No retractions. Lungs  CTAB. Gastrointestinal: Soft with tenderness to the right lower quadrant without palpable hernia sac. However, there is a midline incision from a hysterectomy, when the patient was in her 37s. The tenderness is mild to moderate. There is no rebound or guarding. No distention. No CVA tenderness. Musculoskeletal: No lower extremity tenderness nor edema.  No joint effusions.no tenderness to bilateral hips. 5 out of 5 strength bilateral lower extremities. Neurologic:  Normal speech and language. No gross focal neurologic deficits are appreciated. Skin:  Skin is warm, dry and intact. No rash noted. Psychiatric: Mood and affect are normal. Speech and behavior are normal.  ____________________________________________   LABS (all labs ordered are listed, but only abnormal results are displayed)  Labs Reviewed  COMPREHENSIVE METABOLIC PANEL - Abnormal; Notable for the following:       Result Value   Creatinine, Ser 1.34 (*)    GFR calc  non Af Amer 37 (*)    GFR calc Af Amer 43 (*)    All other components within normal limits  CBC - Abnormal; Notable for the following:    RBC 3.02 (*)    Hemoglobin 10.2 (*)    HCT 29.8 (*)    RDW 17.0 (*)    All other components within normal limits  URINALYSIS, COMPLETE (UACMP) WITH MICROSCOPIC - Abnormal; Notable for the following:    Color, Urine YELLOW (*)    APPearance CLEAR (*)    Squamous Epithelial / LPF 0-5 (*)    All other components within normal limits  LIPASE, BLOOD   ____________________________________________  EKG   ____________________________________________  RADIOLOGY  No acute finding on the CT the abdomen and pelvis. ____________________________________________   PROCEDURES  Procedure(s) performed:   Procedures  Critical Care performed:   ____________________________________________   INITIAL IMPRESSION / ASSESSMENT AND PLAN / ED COURSE  Pertinent labs & imaging results that were available during my care of the  patient were reviewed by me and considered in my medical decision making (see chart for details).  ----------------------------------------- 2:20 PM on 04/02/2017 -----------------------------------------  Patient at this time without any pain. Reexamine the abdomen and she is nontender. No acute finding on CT the abdomen and pelvis. Says that she'll try heating pad at home. She will follow-up with her primary care doctor. Reassuring workup today without any surgical pathology found.possible groin strain.      ____________________________________________   FINAL CLINICAL IMPRESSION(S) / ED DIAGNOSES   Right lower quadrant abdominal pain.   NEW MEDICATIONS STARTED DURING THIS VISIT:  New Prescriptions   No medications on file     Note:  This document was prepared using Dragon voice recognition software and may include unintentional dictation errors.     Orbie Pyo, MD 04/02/17 [phone]

## 2017-04-02 NOTE — Telephone Encounter (Signed)
Tried to call patient at this time. Was unable to get in contact with patient number was disconnected. I will be sending her a letter letting her know to stop her aspirin 5 days prior to her surgery.

## 2017-04-05 ENCOUNTER — Telehealth: Payer: Self-pay

## 2017-04-05 ENCOUNTER — Other Ambulatory Visit: Payer: Self-pay

## 2017-04-05 DIAGNOSIS — C50412 Malignant neoplasm of upper-outer quadrant of left female breast: Secondary | ICD-10-CM

## 2017-04-05 DIAGNOSIS — Z170 Estrogen receptor positive status [ER+]: Principal | ICD-10-CM

## 2017-04-05 NOTE — Telephone Encounter (Signed)
The patient has been advised of the Pre Op date and time and the Surgery date.  Surgery: 04/14/17 - Dr Hampton Abbot  - Left mastectomy, SLN Biopsy  Pre Op: 04/07/17 at 9:45 am, Office appointment.  Patient made aware to arrive at the Radiology desk the morning of her surgery at 7:45 am.

## 2017-04-07 ENCOUNTER — Encounter
Admission: RE | Admit: 2017-04-07 | Discharge: 2017-04-07 | Disposition: A | Discharge status: Home / Self Care | Payer: Medicare Other | Source: Ambulatory Visit | Attending: Surgery | Admitting: Surgery

## 2017-04-07 ENCOUNTER — Telehealth: Payer: Self-pay

## 2017-04-07 DIAGNOSIS — Z01818 Encounter for other preprocedural examination: Secondary | ICD-10-CM | POA: Insufficient documentation

## 2017-04-07 DIAGNOSIS — C50912 Malignant neoplasm of unspecified site of left female breast: Secondary | ICD-10-CM | POA: Insufficient documentation

## 2017-04-07 HISTORY — DX: Anemia, unspecified: D64.9

## 2017-04-07 NOTE — Pre-Procedure Instructions (Signed)
EKG FROM 10/23/16 CALLED AND FAXED TO DR PARASCHOS FOR REVIEW AS INSTRUCTED BY DR Gildardo Griffes. SPOKE WITH PATRICIA ALSO CALLED AND FAXED TO DR Hampton Abbot,. SPOKE WITH AMBER.

## 2017-04-07 NOTE — Patient Instructions (Signed)
Your procedure is scheduled on: Wednesday 04/14/17 Report to [place]. 2ND FLOOR MEDICAL MALL ENTRANCE. To find out your arrival time please call [phone] between 1PM - 3PM on Tuesday 04/13/17.  Remember: Instructions that are not followed completely may result in serious medical risk, up to and including death, or upon the discretion of your surgeon and anesthesiologist your surgery may need to be rescheduled.    __X__ 1. Do not eat anything after midnight the night before your    procedure.  No gum chewing or hard candies.  You may drink clear   liquids up to 2 hours before you are scheduled to arrive at the   hospital for your procedure. Do not drink clear liquids within 2   hours of scheduled arrival to the hospital as this may lead to your   procedure being delayed or rescheduled.       Clear liquids include:   Water or Apple juice without pulp   Clear carbohydrate beverage such as Clearfast or Gatorade   Black coffee or Clear Tea (no milk, no creamer, do not add anything   to the coffee or tea)    Diabetics should only drink water   __X__ 2. No Alcohol for 24 hours before or after surgery.   ____ 3. Bring all medications with you on the day of surgery if instructed.    __X__ 4. Notify your doctor if there is any change in your medical condition     (cold, fever, infections).             __X___5. No smoking within 24 hours of your surgery.     Do not wear jewelry, make-up, hairpins, clips or nail polish.  Do not wear lotions, powders, or perfumes.   Do not shave 48 hours prior to surgery. Men may shave face and neck.  Do not bring valuables to the hospital.    [facility] is not responsible for any belongings or valuables.               Contacts, dentures or bridgework may not be worn into surgery.  Leave your suitcase in the car. After surgery it may be brought to your room.  For patients admitted to the hospital, discharge time is determined by your                 treatment team.   Patients discharged the day of surgery will not be allowed to drive home.   Please read over the following fact sheets that you were given:   MRSA Information   __x__ Take these medicines the morning of surgery with A SIP OF WATER:    1. ATORVASTATIN  2.   3.   4.  5.  6.  ____ Fleet Enema (as directed)   __X__ Use CHG Soap/SAGE wipes as directed  ____ Use inhalers on the day of surgery  ____ Stop metformin 2 days prior to surgery    ____ Take 1/2 of usual insulin dose the night before surgery and none on the morning of surgery.   __X__ Stop Coumadin/Plavix/aspirin on TODAY  __X__ Stop Anti-inflammatories such as Advil, Aleve, Ibuprofen, Motrin, Naproxen, Naprosyn, Goodies,powder, or aspirin products.  OK to take Tylenol.   __X__ Stop supplements, Vitamin E, Fish Oil until after surgery.    ____ Bring C-Pap to the hospital.

## 2017-04-07 NOTE — Telephone Encounter (Signed)
Call received from [facility] in Pre-admission testing at this time whom states that Anesthesia is requesting Cardiology Clearance from Dr. Saralyn Pilar for her Abnormal EKG. PAT has sent the clearance to [facility] today.

## 2017-04-08 ENCOUNTER — Telehealth: Payer: Self-pay

## 2017-04-08 NOTE — Telephone Encounter (Signed)
Call made to [facility] clinic Cardiology at this time to schedule a Cardiology appointment for Ms. Kuehnle. I spoke with Mardene Celeste and she was able to get me an appointment for the patient on 9/24 at 10:45AM.  Call made to patient at this time and told her of the appointment and the reason why we needed her to go, so that we can obtain medical clearance. She verbalized understanding.

## 2017-04-12 DIAGNOSIS — R079 Chest pain, unspecified: Secondary | ICD-10-CM | POA: Diagnosis not present

## 2017-04-12 DIAGNOSIS — E7800 Pure hypercholesterolemia, unspecified: Secondary | ICD-10-CM | POA: Diagnosis not present

## 2017-04-13 ENCOUNTER — Telehealth: Payer: Self-pay

## 2017-04-13 MED ORDER — CLINDAMYCIN PHOSPHATE 600 MG/50ML IV SOLN
600.0000 mg | INTRAVENOUS | Status: AC
  Administered 2017-04-14: 600 mg via INTRAVENOUS

## 2017-04-13 NOTE — Telephone Encounter (Signed)
Patient is scheduled for a Left Total Mastectomy with Sentinel Node Biopsy on 04/14/17 with Dr. Hampton Abbot.  Home Health Referral Information given to [facility] at this time. Joelene Millin (Referral Coordinator) was given this information. Patient will follow Dr. Mont Dutton Post Op Breast Protocol.

## 2017-04-13 NOTE — Pre-Procedure Instructions (Signed)
CLEARED BY DR PARASCHOS LOW RISK 04/12/17

## 2017-04-13 NOTE — Telephone Encounter (Signed)
Cardiac Clearance Obtained from Dr. Saralyn Pilar. This will be scanned to chart under media. PAT was made aware.

## 2017-04-14 ENCOUNTER — Encounter: Payer: Self-pay | Admitting: Anesthesiology

## 2017-04-14 ENCOUNTER — Observation Stay
Admission: RE | Admit: 2017-04-14 | Discharge: 2017-04-15 | Disposition: A | Discharge status: Home / Self Care | Payer: Medicare Other | Source: Ambulatory Visit | Attending: Surgery | Admitting: Surgery

## 2017-04-14 ENCOUNTER — Ambulatory Visit: Payer: Medicare Other | Admitting: Anesthesiology

## 2017-04-14 ENCOUNTER — Encounter
Admission: RE | Admit: 2017-04-14 | Discharge: 2017-04-14 | Disposition: A | Discharge status: Home / Self Care | Payer: Medicare Other | Source: Ambulatory Visit | Attending: Surgery | Admitting: Surgery

## 2017-04-14 ENCOUNTER — Encounter
Admission: RE | Disposition: A | Discharge status: Home / Self Care | Payer: Self-pay | Source: Ambulatory Visit | Attending: Surgery

## 2017-04-14 DIAGNOSIS — Z7982 Long term (current) use of aspirin: Secondary | ICD-10-CM | POA: Diagnosis not present

## 2017-04-14 DIAGNOSIS — Z23 Encounter for immunization: Secondary | ICD-10-CM | POA: Diagnosis not present

## 2017-04-14 DIAGNOSIS — Z171 Estrogen receptor negative status [ER-]: Secondary | ICD-10-CM | POA: Insufficient documentation

## 2017-04-14 DIAGNOSIS — I10 Essential (primary) hypertension: Secondary | ICD-10-CM | POA: Diagnosis not present

## 2017-04-14 DIAGNOSIS — E669 Obesity, unspecified: Secondary | ICD-10-CM | POA: Insufficient documentation

## 2017-04-14 DIAGNOSIS — Z95828 Presence of other vascular implants and grafts: Secondary | ICD-10-CM | POA: Diagnosis not present

## 2017-04-14 DIAGNOSIS — C50412 Malignant neoplasm of upper-outer quadrant of left female breast: Secondary | ICD-10-CM | POA: Diagnosis not present

## 2017-04-14 DIAGNOSIS — Z880 Allergy status to penicillin: Secondary | ICD-10-CM | POA: Diagnosis not present

## 2017-04-14 DIAGNOSIS — E785 Hyperlipidemia, unspecified: Secondary | ICD-10-CM | POA: Diagnosis not present

## 2017-04-14 DIAGNOSIS — N6012 Diffuse cystic mastopathy of left breast: Secondary | ICD-10-CM | POA: Diagnosis not present

## 2017-04-14 DIAGNOSIS — Z79899 Other long term (current) drug therapy: Secondary | ICD-10-CM | POA: Diagnosis not present

## 2017-04-14 DIAGNOSIS — Z91040 Latex allergy status: Secondary | ICD-10-CM | POA: Insufficient documentation

## 2017-04-14 DIAGNOSIS — K219 Gastro-esophageal reflux disease without esophagitis: Secondary | ICD-10-CM | POA: Insufficient documentation

## 2017-04-14 DIAGNOSIS — Z6830 Body mass index (BMI) 30.0-30.9, adult: Secondary | ICD-10-CM | POA: Insufficient documentation

## 2017-04-14 DIAGNOSIS — D242 Benign neoplasm of left breast: Secondary | ICD-10-CM | POA: Diagnosis not present

## 2017-04-14 DIAGNOSIS — C50912 Malignant neoplasm of unspecified site of left female breast: Secondary | ICD-10-CM | POA: Diagnosis not present

## 2017-04-14 DIAGNOSIS — Z170 Estrogen receptor positive status [ER+]: Principal | ICD-10-CM

## 2017-04-14 HISTORY — PX: AXILLARY SENTINEL NODE BIOPSY: SHX5738

## 2017-04-14 HISTORY — PX: MASTECTOMY: SHX3

## 2017-04-14 HISTORY — PX: BREAST EXCISIONAL BIOPSY: SUR124

## 2017-04-14 HISTORY — PX: TOTAL MASTECTOMY: SHX6129

## 2017-04-14 SURGERY — MASTECTOMY, SIMPLE
Anesthesia: General | Laterality: Left | Wound class: Clean

## 2017-04-14 MED ORDER — GABAPENTIN 300 MG PO CAPS
ORAL_CAPSULE | ORAL | Status: AC
  Administered 2017-04-14: 300 mg via ORAL
  Filled 2017-04-14: qty 1

## 2017-04-14 MED ORDER — KETOROLAC TROMETHAMINE 15 MG/ML IJ SOLN
15.0000 mg | Freq: Four times a day (QID) | INTRAMUSCULAR | Status: DC
  Administered 2017-04-14 – 2017-04-15 (×3): 15 mg via INTRAVENOUS
  Filled 2017-04-14 (×3): qty 1

## 2017-04-14 MED ORDER — ACETAMINOPHEN 500 MG PO TABS
1000.0000 mg | ORAL_TABLET | Freq: Four times a day (QID) | ORAL | Status: DC | PRN
Start: 2017-04-14 — End: 2017-04-15

## 2017-04-14 MED ORDER — FENTANYL CITRATE (PF) 100 MCG/2ML IJ SOLN
INTRAMUSCULAR | Status: AC
  Administered 2017-04-14: 17:00:00
  Filled 2017-04-14: qty 2

## 2017-04-14 MED ORDER — ACETAMINOPHEN 500 MG PO TABS
ORAL_TABLET | ORAL | Status: AC
  Administered 2017-04-14: 1000 mg via ORAL
  Filled 2017-04-14: qty 2

## 2017-04-14 MED ORDER — ONDANSETRON HCL 4 MG/2ML IJ SOLN: 4.0000 mg | Freq: Four times a day (QID) | INTRAMUSCULAR | Status: DC | PRN

## 2017-04-14 MED ORDER — ONDANSETRON 4 MG PO TBDP: 4.0000 mg | ORAL_TABLET | Freq: Four times a day (QID) | ORAL | Status: DC | PRN

## 2017-04-14 MED ORDER — OXYCODONE HCL 5 MG PO TABS
5.0000 mg | ORAL_TABLET | Freq: Once | ORAL | Status: AC
  Administered 2017-04-14: 5 mg via ORAL

## 2017-04-14 MED ORDER — LACTATED RINGERS IV SOLN
125.0000 mL/h | INTRAVENOUS | Status: DC
  Administered 2017-04-14 – 2017-04-15 (×2): 125 mL/h via INTRAVENOUS

## 2017-04-14 MED ORDER — CHLORHEXIDINE GLUCONATE CLOTH 2 % EX PADS: 6.0000 | MEDICATED_PAD | Freq: Once | CUTANEOUS | Status: DC

## 2017-04-14 MED ORDER — SEVOFLURANE IN SOLN
RESPIRATORY_TRACT | Status: AC
  Filled 2017-04-14: qty 250

## 2017-04-14 MED ORDER — PANTOPRAZOLE SODIUM 40 MG IV SOLR
40.0000 mg | Freq: Every day | INTRAVENOUS | Status: DC
  Administered 2017-04-14: 40 mg via INTRAVENOUS
  Filled 2017-04-14: qty 40

## 2017-04-14 MED ORDER — MIDAZOLAM HCL 2 MG/2ML IJ SOLN
INTRAMUSCULAR | Status: DC | PRN
  Administered 2017-04-14: 1 mg via INTRAVENOUS

## 2017-04-14 MED ORDER — INFLUENZA VAC SPLIT HIGH-DOSE 0.5 ML IM SUSY
0.5000 mL | PREFILLED_SYRINGE | INTRAMUSCULAR | Status: AC
  Administered 2017-04-15: 0.5 mL via INTRAMUSCULAR
  Filled 2017-04-14: qty 0.5

## 2017-04-14 MED ORDER — HYDROMORPHONE HCL 1 MG/ML IJ SOLN: 0.5000 mg | INTRAMUSCULAR | Status: DC | PRN

## 2017-04-14 MED ORDER — FENTANYL CITRATE (PF) 100 MCG/2ML IJ SOLN
INTRAMUSCULAR | Status: AC
  Filled 2017-04-14: qty 2

## 2017-04-14 MED ORDER — POLYETHYLENE GLYCOL 3350 17 G PO PACK: 17.0000 g | PACK | Freq: Every day | ORAL | Status: DC | PRN

## 2017-04-14 MED ORDER — ONDANSETRON HCL 4 MG/2ML IJ SOLN: INTRAMUSCULAR | Status: DC | PRN

## 2017-04-14 MED ORDER — LACTATED RINGERS IV SOLN
INTRAVENOUS | Status: DC
  Administered 2017-04-14 (×3): via INTRAVENOUS

## 2017-04-14 MED ORDER — EPHEDRINE SULFATE 50 MG/ML IJ SOLN
INTRAMUSCULAR | Status: DC | PRN
  Administered 2017-04-14 (×2): 10 mg via INTRAVENOUS

## 2017-04-14 MED ORDER — OXYCODONE HCL 5 MG PO TABS: 5.0000 mg | ORAL_TABLET | ORAL | Status: DC | PRN

## 2017-04-14 MED ORDER — FENTANYL CITRATE (PF) 100 MCG/2ML IJ SOLN
25.0000 ug | INTRAMUSCULAR | Status: DC | PRN
  Administered 2017-04-14 (×2): 50 ug via INTRAVENOUS

## 2017-04-14 MED ORDER — PHENYLEPHRINE HCL 10 MG/ML IJ SOLN
INTRAMUSCULAR | Status: DC | PRN
  Administered 2017-04-14 (×7): 100 ug via INTRAVENOUS

## 2017-04-14 MED ORDER — BUPIVACAINE-EPINEPHRINE (PF) 0.5% -1:200000 IJ SOLN
INTRAMUSCULAR | Status: AC
  Filled 2017-04-14: qty 30

## 2017-04-14 MED ORDER — BUPIVACAINE LIPOSOME 1.3 % IJ SUSP
INTRAMUSCULAR | Status: AC
  Filled 2017-04-14: qty 20

## 2017-04-14 MED ORDER — METHYLENE BLUE 0.5 % INJ SOLN
INTRAVENOUS | Status: AC
  Filled 2017-04-14: qty 10

## 2017-04-14 MED ORDER — ATORVASTATIN CALCIUM 20 MG PO TABS
40.0000 mg | ORAL_TABLET | Freq: Every day | ORAL | Status: DC
  Filled 2017-04-14: qty 2

## 2017-04-14 MED ORDER — OXYCODONE HCL 5 MG PO TABS
ORAL_TABLET | ORAL | Status: AC
  Administered 2017-04-14: 5 mg via ORAL
  Filled 2017-04-14: qty 1

## 2017-04-14 MED ORDER — ONDANSETRON HCL 4 MG/2ML IJ SOLN
INTRAMUSCULAR | Status: AC
  Filled 2017-04-14: qty 2

## 2017-04-14 MED ORDER — PHENYLEPHRINE HCL 10 MG/ML IJ SOLN
INTRAMUSCULAR | Status: AC
  Filled 2017-04-14: qty 1

## 2017-04-14 MED ORDER — FLUTICASONE PROPIONATE 50 MCG/ACT NA SUSP
1.0000 | Freq: Every day | NASAL | Status: DC | PRN
  Filled 2017-04-14: qty 16

## 2017-04-14 MED ORDER — SODIUM CHLORIDE 0.9 % IJ SOLN: INTRAMUSCULAR | Status: DC | PRN

## 2017-04-14 MED ORDER — ONDANSETRON HCL 4 MG/2ML IJ SOLN
INTRAMUSCULAR | Status: DC | PRN
  Administered 2017-04-14: 4 mg via INTRAVENOUS

## 2017-04-14 MED ORDER — LIDOCAINE HCL (CARDIAC) 20 MG/ML IV SOLN
INTRAVENOUS | Status: DC | PRN
  Administered 2017-04-14: 100 mg via INTRAVENOUS

## 2017-04-14 MED ORDER — ACETAMINOPHEN 500 MG PO TABS
1000.0000 mg | ORAL_TABLET | ORAL | Status: AC
  Administered 2017-04-14: 1000 mg via ORAL

## 2017-04-14 MED ORDER — METHYLENE BLUE 0.5 % INJ SOLN
INTRAVENOUS | Status: DC | PRN
Start: 2017-04-14 — End: 2017-04-14
  Administered 2017-04-14: 6 mL via SUBMUCOSAL

## 2017-04-14 MED ORDER — HEPARIN SODIUM (PORCINE) 5000 UNIT/ML IJ SOLN
5000.0000 [IU] | Freq: Three times a day (TID) | INTRAMUSCULAR | Status: DC
  Administered 2017-04-14 – 2017-04-15 (×2): 5000 [IU] via SUBCUTANEOUS
  Filled 2017-04-14 (×2): qty 1

## 2017-04-14 MED ORDER — OXYCODONE HCL 5 MG/5ML PO SOLN: 5.0000 mg | Freq: Once | ORAL | Status: DC | PRN

## 2017-04-14 MED ORDER — DEXAMETHASONE SODIUM PHOSPHATE 10 MG/ML IJ SOLN
INTRAMUSCULAR | Status: AC
  Filled 2017-04-14: qty 1

## 2017-04-14 MED ORDER — SODIUM CHLORIDE 0.9 % IJ SOLN
INTRAMUSCULAR | Status: AC
  Filled 2017-04-14: qty 10

## 2017-04-14 MED ORDER — CLINDAMYCIN PHOSPHATE 600 MG/50ML IV SOLN
INTRAVENOUS | Status: AC
  Filled 2017-04-14: qty 50

## 2017-04-14 MED ORDER — PROPOFOL 10 MG/ML IV BOLUS
INTRAVENOUS | Status: AC
  Filled 2017-04-14: qty 20

## 2017-04-14 MED ORDER — TECHNETIUM TC 99M SULFUR COLLOID FILTERED
0.8400 | Freq: Once | INTRAVENOUS | Status: AC | PRN
  Administered 2017-04-14: 0.84 via INTRADERMAL

## 2017-04-14 MED ORDER — FENTANYL CITRATE (PF) 100 MCG/2ML IJ SOLN
INTRAMUSCULAR | Status: DC | PRN
  Administered 2017-04-14 (×5): 25 ug via INTRAVENOUS
  Administered 2017-04-14: 50 ug via INTRAVENOUS

## 2017-04-14 MED ORDER — MIDAZOLAM HCL 2 MG/2ML IJ SOLN
INTRAMUSCULAR | Status: AC
  Filled 2017-04-14: qty 2

## 2017-04-14 MED ORDER — FENTANYL CITRATE (PF) 100 MCG/2ML IJ SOLN
INTRAMUSCULAR | Status: AC
  Administered 2017-04-14: 50 ug via INTRAVENOUS
  Filled 2017-04-14: qty 2

## 2017-04-14 MED ORDER — PROPOFOL 10 MG/ML IV BOLUS
INTRAVENOUS | Status: DC | PRN
  Administered 2017-04-14: 150 mg via INTRAVENOUS
  Administered 2017-04-14: 50 mg via INTRAVENOUS

## 2017-04-14 MED ORDER — SODIUM CHLORIDE 0.9 % IV SOLN
INTRAVENOUS | Status: DC | PRN
  Administered 2017-04-14: 30 mL

## 2017-04-14 MED ORDER — BUPIVACAINE-EPINEPHRINE 0.5% -1:200000 IJ SOLN
INTRAMUSCULAR | Status: DC | PRN
  Administered 2017-04-14: 30 mL

## 2017-04-14 MED ORDER — ADULT MULTIVITAMIN W/MINERALS CH
1.0000 | ORAL_TABLET | ORAL | Status: DC
Start: 2017-04-14 — End: 2017-04-15

## 2017-04-14 MED ORDER — FAMOTIDINE 20 MG PO TABS: 20.0000 mg | ORAL_TABLET | Freq: Once | ORAL | Status: DC

## 2017-04-14 MED ORDER — FERROUS SULFATE 325 (65 FE) MG PO TABS
325.0000 mg | ORAL_TABLET | ORAL | Status: DC
  Administered 2017-04-14: 325 mg via ORAL
  Filled 2017-04-14: qty 1

## 2017-04-14 MED ORDER — GABAPENTIN 300 MG PO CAPS
300.0000 mg | ORAL_CAPSULE | ORAL | Status: AC
  Administered 2017-04-14: 300 mg via ORAL

## 2017-04-14 MED ORDER — DEXAMETHASONE SODIUM PHOSPHATE 10 MG/ML IJ SOLN
INTRAMUSCULAR | Status: DC | PRN
  Administered 2017-04-14: 10 mg via INTRAVENOUS

## 2017-04-14 MED ORDER — EPHEDRINE SULFATE 50 MG/ML IJ SOLN
INTRAMUSCULAR | Status: AC
  Filled 2017-04-14: qty 1

## 2017-04-14 MED ORDER — OXYCODONE HCL 5 MG PO TABS
5.0000 mg | ORAL_TABLET | Freq: Once | ORAL | Status: DC | PRN
  Administered 2017-04-14: 5 mg via ORAL

## 2017-04-14 SURGICAL SUPPLY — 35 items
BRA SURGICAL MEDIUM (MISCELLANEOUS) ×3 IMPLANT
BULB RESERV EVAC DRAIN JP 100C (MISCELLANEOUS) ×6 IMPLANT
CANISTER SUCT 1200ML W/VALVE (MISCELLANEOUS) ×3 IMPLANT
CHLORAPREP W/TINT 26ML (MISCELLANEOUS) ×3 IMPLANT
CNTNR SPEC 2.5X3XGRAD LEK (MISCELLANEOUS)
CONT SPEC 4OZ STER OR WHT (MISCELLANEOUS)
CONTAINER SPEC 2.5X3XGRAD LEK (MISCELLANEOUS) IMPLANT
DERMABOND ADVANCED (GAUZE/BANDAGES/DRESSINGS) ×2
DERMABOND ADVANCED .7 DNX12 (GAUZE/BANDAGES/DRESSINGS) ×1 IMPLANT
DRAIN CHANNEL JP 15F RND 16 (MISCELLANEOUS) IMPLANT
DRAPE LAPAROTOMY TRNSV 106X77 (MISCELLANEOUS) ×3 IMPLANT
DRSG TEGADERM 4X4.75 (GAUZE/BANDAGES/DRESSINGS) ×3 IMPLANT
ELECT CAUTERY BLADE 6.4 (BLADE) IMPLANT
ELECT REM PT RETURN 9FT ADLT (ELECTROSURGICAL) ×3
ELECTRODE REM PT RTRN 9FT ADLT (ELECTROSURGICAL) ×1 IMPLANT
GAUZE FLUFF 18X24 1PLY STRL (GAUZE/BANDAGES/DRESSINGS) ×3 IMPLANT
GAUZE SPONGE 4X4 12PLY STRL (GAUZE/BANDAGES/DRESSINGS) ×3 IMPLANT
GLOVE BIO SURGEON STRL SZ7 (GLOVE) IMPLANT
GOWN STRL REUS W/ TWL LRG LVL3 (GOWN DISPOSABLE) ×3 IMPLANT
GOWN STRL REUS W/TWL LRG LVL3 (GOWN DISPOSABLE) ×6
LABEL OR SOLS (LABEL) ×3 IMPLANT
NDL SAFETY 22GX1.5 (NEEDLE) ×3 IMPLANT
NEEDLE HYPO 22GX1.5 SAFETY (NEEDLE) ×6 IMPLANT
PACK BASIN MAJOR ARMC (MISCELLANEOUS) ×3 IMPLANT
SLEVE PROBE SENORX GAMMA FIND (MISCELLANEOUS) ×3 IMPLANT
SUT ETHILON 3-0 FS-10 30 BLK (SUTURE) ×6
SUT MNCRL 4-0 (SUTURE) ×4
SUT MNCRL 4-0 27XMFL (SUTURE) ×2
SUT SILK 2 0 SH (SUTURE) ×3 IMPLANT
SUT VIC AB 2-0 CT1 (SUTURE) ×12 IMPLANT
SUTURE EHLN 3-0 FS-10 30 BLK (SUTURE) ×2 IMPLANT
SUTURE MNCRL 4-0 27XMF (SUTURE) ×2 IMPLANT
SYR 30ML LL (SYRINGE) ×3 IMPLANT
SYR BULB IRRIG 60ML STRL (SYRINGE) ×3 IMPLANT
SYRINGE 10CC LL (SYRINGE) ×3 IMPLANT

## 2017-04-14 NOTE — H&P (View-Only) (Signed)
04/01/2017  History of Present Illness: Particia Patterson is a 76 y.o. 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  She presents for discussion for surgery.  She has finished chemotherapy with Dr. Grayland Ormond.  She had a follow up PET scan as well as mammogram/ultrasound.  Her PET scan shows no activity of the left axillary lymph nodes.  Her mammogram and ultrasound show improvement in the two masses of the left breast.  She denies any changes in her breast or axilla.  Past Medical History: Past Medical History:  Diagnosis Date  . Breast cancer ([place])   . Breast mass, left 09/22/2016   RECOMMENDATION: Ultrasound-guided core biopsies of masses at left breast 2 o'clock 2 cm from nipple, left breast 2 o'clock 5 cm from nipple, abnormal left axillary lymph nodes.  Marland Kitchen GERD (gastroesophageal reflux disease)   . Hyperglycemia   . Hyperlipidemia   . Obesity   . Postmenopausal      Past Surgical History: Past Surgical History:  Procedure Laterality Date  . ABDOMINAL HYSTERECTOMY    . BREAST BIOPSY Left 09/30/2016   US biopsy of 3 areas + chemo  . ESOPHAGOGASTRODUODENOSCOPY (EGD) WITH PROPOFOL N/A 12/24/2014   Procedure: ESOPHAGOGASTRODUODENOSCOPY (EGD) WITH PROPOFOL;  Surgeon: Manya Silvas, MD;  Location: [facility] ENDOSCOPY;  Service: Endoscopy;  Laterality: N/A;  . PORTACATH PLACEMENT Right 10/27/2016   Procedure: INSERTION PORT-A-CATH;  Surgeon: Nestor Lewandowsky, MD;  Location: [facility] ORS;  Service: General;  Laterality: Right;  . TONSILECTOMY, ADENOIDECTOMY, BILATERAL MYRINGOTOMY AND TUBES      Home Medications: Prior to Admission medications   Medication Sig Start Date End Date Taking? Authorizing Provider  acetaminophen (TYLENOL) 500 MG tablet Take 1,000 mg by mouth every 6 (six) hours as needed for mild pain.   Yes [provider]  aspirin 81 MG tablet Take 81 mg by mouth daily.   Yes [provider]  atorvastatin (LIPITOR) 40 MG tablet  12/31/16  Yes  [provider]  Cholecalciferol (VITAMIN D) 2000 units tablet Take 2,000 Units by mouth daily. One by mouth twice a week 09/01/16  Yes Lada, Satira Anis, MD  ferrous sulfate 325 (65 FE) MG EC tablet Take 325 mg by mouth every other day.  09/01/16  Yes Lada, Satira Anis, MD  fluticasone (FLONASE) 50 MCG/ACT nasal spray Place 1 spray into both nostrils daily as needed for allergies or rhinitis.   Yes [provider]  Multiple Vitamin (MULTIVITAMIN WITH MINERALS) TABS tablet Take 1 tablet by mouth every other day.  09/01/16  Yes Lada, Satira Anis, MD  omeprazole (PRILOSEC) 20 MG capsule Take 20 mg by mouth daily as needed (heartburn).    Yes [provider]  ondansetron (ZOFRAN) 8 MG tablet Take 1 tablet (8 mg total) by mouth 2 (two) times daily as needed for refractory nausea / vomiting. 10/20/16  Yes Lloyd Huger, MD  prochlorperazine (COMPAZINE) 10 MG tablet Take 1 tablet (10 mg total) by mouth every 6 (six) hours as needed (Nausea or vomiting). 10/20/16  Yes Lloyd Huger, MD    Allergies: Allergies  Allergen Reactions  . Latex Itching  . Penicillins Itching and Rash    Has patient had a PCN reaction causing immediate rash, facial/tongue/throat swelling, SOB or lightheadedness with hypotension: Yes Has patient had a PCN reaction causing severe rash involving mucus membranes or skin necrosis: No Has patient had a PCN reaction that required hospitalization No Has patient had a PCN reaction occurring within the  last 10 years: No If all of the above answers are "NO", then may proceed with Cephalosporin use.     Review of Systems: Review of Systems  Constitutional: Negative for chills and fever.  Respiratory: Negative for shortness of breath.   Cardiovascular: Negative for chest pain.  Gastrointestinal: Negative for abdominal pain and nausea.    Physical Exam BP (!) 150/78   Pulse (!) 109   Temp 98.2 F (36.8 C) (Oral)   Ht 5\' 1"  (1.549 m)   Wt 72.4 kg  (159 lb 9.6 oz)   BMI 30.16 kg/m  CONSTITUTIONAL: No acute distress HEENT:  Normocephalic, atraumatic, extraocular motion intact. RESPIRATORY:  Normal respiratory effort without pathologic use of accessory muscles. CARDIOVASCULAR: Heart is regular without murmurs, gallops, or rubs. BREAST:  Deferred GI: The abdomen is soft, nondistended, nontender.  NEUROLOGIC:  Motor and sensation is grossly normal.  Cranial nerves are grossly intact. PSYCH:  Alert and oriented to person, place and time. Affect is normal.  Labs/Imaging: PET/CT:   IMPRESSION: 1. No evidence of metastatic disease. 2. Right IJ Port-A-Cath terminates in the proximal left brachiocephalic vein. These results will be called to the ordering clinician or representative by the Radiologist Assistant, and communication documented in the PACS or zVision Dashboard. 3. Aortic atherosclerosis (ICD10-170.0). Coronary artery calcification.  Mammogram/Ultrasound: Mammogram and ultrasound imaging demonstrates a positive response to neoadjuvant chemotherapy of both masses in the 2 o'clock region.  Normal interval resolution of left axillary lymphadenopathy.   Assessment and Plan: This is a 76 y.o. female who presents with left breast invasive cancer spread to lymph nodes, finished neoadjuvant chemotherapy.  Discussed the role for surgery and the plan for a left mastectomy as well as a left axillary sentinel node biopsy.  Given that she has two separate masses and the breast size, would not recommend in favor of breast conserving surgery.  Discussed post-op outcomes and expectations for overnight stay and two drains in place under skin flaps, incision for the sentinel node, outpatient follow up.  Discussed risks including risk of bleeding, infection, and injury to surrounding structures.  She is willing to proceed. She understands that proceeding with surgery is recommended and that this would improve her chance of survival.  She knows to  hold ASA for 3 days prior to surgery.  She will be scheduled for 9/26.  Face-to-face time spent with the patient and care providers was 25 minutes, with more than 50% of the time spent counseling, educating, and coordinating care of the patient.     Melvyn Neth, [place]

## 2017-04-14 NOTE — Brief Op Note (Signed)
04/14/2017  1:23 PM  PATIENT:  Maria Patterson  76 y.o. female  PRE-OPERATIVE DIAGNOSIS:  left breast invasive cancer  POST-OPERATIVE DIAGNOSIS:  left breast cancer  PROCEDURE:  Procedure(s): TOTAL MASTECTOMY (Left) AXILLARY SENTINEL NODE BIOPSY (Left)  SURGEON:  Surgeon(s) and Role:    * Princeston Blizzard, MD - Primary   ANESTHESIA:   general  EBL:  Total I/O In: 1000 [I.V.:1000] Out: 250 [Blood:250]  BLOOD ADMINISTERED:none  DRAINS: (2) Blake drain(s) in the left chest   LOCAL MEDICATIONS USED:  BUPIVICAINE   SPECIMEN:  Mastectomy with SLN  DISPOSITION OF SPECIMEN:  PATHOLOGY  COUNTS:  YES  TOURNIQUET:  * No tourniquets in log *  DICTATION: .Dragon Dictation  PLAN OF CARE: Admit to inpatient   PATIENT DISPOSITION:  PACU - hemodynamically stable.   Delay start of Pharmacological VTE agent (>24hrs) due to surgical blood loss or risk of bleeding: yes

## 2017-04-14 NOTE — Interval H&P Note (Signed)
History and Physical Interval Note:  04/14/2017 9:16 AM  Maria Patterson  has presented today for surgery, with the diagnosis of left breast invasive cancer  The various methods of treatment have been discussed with the patient and family. After consideration of risks, benefits and other options for treatment, the patient has consented to  Procedure(s): TOTAL MASTECTOMY (Left) AXILLARY SENTINEL NODE BIOPSY (Left) as a surgical intervention .  The patient's history has been reviewed, patient examined, no change in status, stable for surgery.  I have reviewed the patient's chart and labs.  Questions were answered to the patient's satisfaction.     [name]

## 2017-04-14 NOTE — Op Note (Signed)
  Procedure Date:  04/14/2017  Pre-operative Diagnosis:  Left breast cancer with positive axillary lymph node, s/p neoadjuvant chemotherapy  Post-operative Diagnosis: Left breast cancer with positive axillary lymph node, s/p neoadjuvant chemotherapy  Procedure:  Left complete mastectomy and left axillary sentinel lymph node biopsy  Surgeon:  Melvyn Neth, MD  Anesthesia:  General endotracheal  Estimated Blood Loss:  30 ml  Specimens:  Left breast, left axillary sentinel node, left axillary node  Complications:  None  Indications for Procedure:  This is a 76 y.o. female who presents with left breast cancer, ER/PR negative but HER2 positive, with positive lymph node biopsy.  She has completed neoadjuvant chemotherapy and repeat PET scan showed no further activity in the left axilla, and mammogram showed improvement in her left breast masses.  She presents now for surgical management.  The risks of bleeding, infection, injury to surrounding structures, hematoma, seroma, open wound, cosmetic deformity, and the need for further surgery were all discussed with the patient and was willing to proceed.  Prior to this procedure, the patient had undergone sentinel lymphoscintigraphy.  Description of Procedure: The patient was correctly identified in the preoperative area and brought into the operating room.  The patient was placed supine with VTE prophylaxis in place.  Appropriate time-outs were performed.  Anesthesia was induced and the patient was intubated.  Appropriate antibiotics were infused.  A visual dye was injected in the left periareolar region under aseptic conditions. The left chest and axilla were prepped and draped in usual sterile fashion.  Then using the hand-held probe an area of high counts was identified in the axilla, an incision was made and direction by the probe aided in dissection of a lymph node with count of 62.  Another area of high count was identified and removed, but  after excision, the count was only 5.  The background count in the axillary cavity after excision was 4.  The cavity was irrigated and hemostasis was assured with electrocautery.  Local anesthetic was infiltrated into the skin and subcutaneous tissue of the cavity.  The wound was then closed in two layers with 2-0 Vicryl and 4-0 Monocryl and sealed with DermaBond.  An elliptical incision was then made over the breast encompassing the nipple-areolar complex.  Using electrocautery, subcutaneous flaps were created superiorly to the clavicle, inferiorly to the inframammary fold, medially to the sternum, and laterally to the latissimus dorsi with careful attention to create flaps of adequate thickness.  The breast tissue was then dissected off the pectoralis fascia as the deep margin.  2-0 silk suture was used to mark the specimen as single suture medial and double suture superior.  The cavity was then irrigated and hemostasis was assured with electrocautery.  Exparel local anesthetic was infiltrated into the skin and subcutaneous tissue of the cavity.  Two 19 Fr Blake drains were placed inside the cavity, one going lateral to medial inferiorly, and the other going lateral to medial superiorly.  The drains were secured to the skin using 3-0 Nylon.  The wound was then closed in two layers with 2-0 Vicryl and 4-0 Monocryl and sealed with DermaBond.  The wound was dressed with fluff gauze and a surgical bra was placed.  The patient was emerged from anesthesia and extubated and brought to the recovery room for further management.  The patient tolerated the procedure well and all counts were correct at the end of the case.   Melvyn Neth, MD

## 2017-04-14 NOTE — Anesthesia Preprocedure Evaluation (Signed)
Anesthesia Evaluation  Patient identified by MRN, date of birth, ID band Patient awake    Reviewed: Allergy & Precautions, H&P , NPO status , Patient's Chart, lab work & pertinent test results  History of Anesthesia Complications Negative for: history of anesthetic complications  Airway Mallampati: III  TM Distance: >3 FB Neck ROM: limited    Dental  (+) Poor Dentition, Chipped, Caps   Pulmonary neg pulmonary ROS, neg shortness of breath,           Cardiovascular Exercise Tolerance: Good hypertension, (-) angina(-) Past MI and (-) DOE      Neuro/Psych negative neurological ROS  negative psych ROS   GI/Hepatic Neg liver ROS, GERD  Controlled and Medicated,  Endo/Other  negative endocrine ROS  Renal/GU      Musculoskeletal   Abdominal   Peds  Hematology negative hematology ROS (+)   Anesthesia Other Findings Past Medical History: No date: Anemia No date: Breast cancer ([place]) 09/22/2016: Breast mass, left     Comment:  RECOMMENDATION: Ultrasound-guided core biopsies of               masses at left breast 2 o'clock 2 cm from nipple, left               breast 2 o'clock 5 cm from nipple, abnormal left axillary              lymph nodes. No date: GERD (gastroesophageal reflux disease) No date: Hyperglycemia No date: Hyperlipidemia No date: Obesity No date: Postmenopausal  Past Surgical History: No date: ABDOMINAL HYSTERECTOMY 09/30/2016: BREAST BIOPSY; Left     Comment:  US biopsy of 3 areas + chemo 12/24/2014: ESOPHAGOGASTRODUODENOSCOPY (EGD) WITH PROPOFOL; N/A     Comment:  Procedure: ESOPHAGOGASTRODUODENOSCOPY (EGD) WITH               PROPOFOL;  Surgeon: Manya Silvas, MD;  Location: [facility] [address]              ENDOSCOPY;  Service: Endoscopy;  Laterality: N/A; 10/27/2016: PORTACATH PLACEMENT; Right     Comment:  Procedure: INSERTION PORT-A-CATH;  Surgeon: Nestor Lewandowsky, MD;  Location: [facility] ORS;  Service:  General;                Laterality: Right; No date: TONSILECTOMY, ADENOIDECTOMY, BILATERAL MYRINGOTOMY AND TUBES No date: TONSILLECTOMY     Reproductive/Obstetrics negative OB ROS                             Anesthesia Physical Anesthesia Plan  ASA: III  Anesthesia Plan: General LMA   Post-op Pain Management:    Induction: Intravenous  PONV Risk Score and Plan: 3 and Ondansetron, Dexamethasone and Treatment may vary due to age or medical condition  Airway Management Planned: LMA  Additional Equipment:   Intra-op Plan:   Post-operative Plan: Extubation in OR  Informed Consent: I have reviewed the patients History and Physical, chart, labs and discussed the procedure including the risks, benefits and alternatives for the proposed anesthesia with the patient or authorized representative who has indicated his/her understanding and acceptance.   Dental Advisory Given  Plan Discussed with: Anesthesiologist, CRNA and Surgeon  Anesthesia Plan Comments: (Patient consented for risks of anesthesia including but not limited to:  - adverse reactions to medications - damage to teeth, lips or other oral mucosa - sore throat or hoarseness -  Damage to heart, brain, lungs or loss of life  Patient voiced understanding.)        Anesthesia Quick Evaluation

## 2017-04-14 NOTE — Anesthesia Procedure Notes (Signed)
Procedure Name: LMA Insertion Date/Time: 04/14/2017 9:57 AM Performed by: Allean Found Pre-anesthesia Checklist: Patient identified, Emergency Drugs available, Suction available, Patient being monitored and Timeout performed Patient Re-evaluated:Patient Re-evaluated prior to induction Oxygen Delivery Method: Circle system utilized Preoxygenation: Pre-oxygenation with 100% oxygen Induction Type: IV induction Ventilation: Mask ventilation without difficulty LMA: LMA inserted LMA Size: 3.0 Number of attempts: 1 Placement Confirmation: positive ETCO2 and breath sounds checked- equal and bilateral Tube secured with: Tape Dental Injury: Teeth and Oropharynx as per pre-operative assessment

## 2017-04-14 NOTE — Anesthesia Post-op Follow-up Note (Signed)
Anesthesia QCDR form completed.        

## 2017-04-14 NOTE — Transfer of Care (Signed)
Immediate Anesthesia Transfer of Care Note  Patient: Maria Patterson  Procedure(s) Performed: Procedure(s): TOTAL MASTECTOMY (Left) AXILLARY SENTINEL NODE BIOPSY (Left)  Patient Location: PACU  Anesthesia Type:General  Level of Consciousness: sedated  Airway & Oxygen Therapy: Patient Spontanous Breathing and Patient connected to face mask oxygen  Post-op Assessment: Report given to RN and Post -op Vital signs reviewed and stable  Post vital signs: Reviewed and stable  Last Vitals:  Vitals:   04/14/17 0819  BP: (!) 136/53  Pulse: 80  Resp: 20  Temp: 36.9 C  SpO2: 100%    Last Pain:  Vitals:   04/14/17 0819  TempSrc: Oral  PainSc: 6          Complications: No apparent anesthesia complications

## 2017-04-15 ENCOUNTER — Encounter: Payer: Self-pay | Admitting: Surgery

## 2017-04-15 DIAGNOSIS — Z880 Allergy status to penicillin: Secondary | ICD-10-CM | POA: Diagnosis not present

## 2017-04-15 DIAGNOSIS — K219 Gastro-esophageal reflux disease without esophagitis: Secondary | ICD-10-CM | POA: Diagnosis not present

## 2017-04-15 DIAGNOSIS — Z23 Encounter for immunization: Secondary | ICD-10-CM | POA: Diagnosis not present

## 2017-04-15 DIAGNOSIS — E785 Hyperlipidemia, unspecified: Secondary | ICD-10-CM | POA: Diagnosis not present

## 2017-04-15 DIAGNOSIS — Z171 Estrogen receptor negative status [ER-]: Secondary | ICD-10-CM | POA: Diagnosis not present

## 2017-04-15 DIAGNOSIS — Z79899 Other long term (current) drug therapy: Secondary | ICD-10-CM | POA: Diagnosis not present

## 2017-04-15 DIAGNOSIS — I10 Essential (primary) hypertension: Secondary | ICD-10-CM | POA: Diagnosis not present

## 2017-04-15 DIAGNOSIS — Z95828 Presence of other vascular implants and grafts: Secondary | ICD-10-CM | POA: Diagnosis not present

## 2017-04-15 DIAGNOSIS — D242 Benign neoplasm of left breast: Secondary | ICD-10-CM | POA: Diagnosis not present

## 2017-04-15 DIAGNOSIS — Z91040 Latex allergy status: Secondary | ICD-10-CM | POA: Diagnosis not present

## 2017-04-15 DIAGNOSIS — Z7982 Long term (current) use of aspirin: Secondary | ICD-10-CM | POA: Diagnosis not present

## 2017-04-15 DIAGNOSIS — C50912 Malignant neoplasm of unspecified site of left female breast: Secondary | ICD-10-CM | POA: Diagnosis not present

## 2017-04-15 LAB — CBC
HCT: 24.8 % — ABNORMAL LOW (ref 35.0–47.0)
Hemoglobin: 8.3 g/dL — ABNORMAL LOW (ref 12.0–16.0)
MCH: 32.9 pg (ref 26.0–34.0)
MCHC: 33.6 g/dL (ref 32.0–36.0)
MCV: 98 fL (ref 80.0–100.0)
PLATELETS: 168 10*3/uL (ref 150–440)
RBC: 2.53 MIL/uL — ABNORMAL LOW (ref 3.80–5.20)
RDW: 15.8 % — AB (ref 11.5–14.5)
WBC: 8.1 10*3/uL (ref 3.6–11.0)

## 2017-04-15 MED ORDER — OXYCODONE HCL 5 MG PO TABS: 5.0000 mg | ORAL_TABLET | ORAL | 0 refills | Status: DC | PRN

## 2017-04-15 NOTE — Care Management Obs Status (Signed)
[place] NOTIFICATION   Patient Details  Name: Maria Patterson MRN: [id number] Date of Birth: Feb 08, 1941   Medicare Observation Status Notification Given:  No (admitted obs less than 24 hours)    Beverly Sessions, RN 04/15/2017, 10:29 AM

## 2017-04-15 NOTE — Progress Notes (Signed)
Patient discharged to home as ordered. Discharge instructions given as ordered and follow up appointments given as ordered. Instructions given on emptying JP and recording output drain to patient and daughter. Dressing changed to left breast incision clean dry and intact, no drainage noted. Patient is alert and oriented, ambulates well without assistance no acute distress noted. Prescription given as ordered

## 2017-04-15 NOTE — Discharge Summary (Signed)
Patient ID: Maria Patterson MRN: [id number] DOB/AGE: 78-09-1940 78 y.o.  Admit date: 04/14/2017 Discharge date: 04/15/2017  Discharge Diagnoses:  Breast cancer  Procedures Performed: Left mastectomy with sentinel lymph node biopsy  Discharged Condition: good  Hospital Course: Patient brought into hospital for planned left mastectomy with sentinel lymph node biopsy. She tolerated the procedure well. On the day of discharge she was pain free and tolerating a regular diet.  Discharge Orders: Discharge Instructions    Call MD for:  difficulty breathing, headache or visual disturbances    Complete by:  As directed    Call MD for:  persistant nausea and vomiting    Complete by:  As directed    Call MD for:  redness, tenderness, or signs of infection (pain, swelling, redness, odor or green/yellow discharge around incision site)    Complete by:  As directed    Call MD for:  severe uncontrolled pain    Complete by:  As directed    Call MD for:  temperature >100.4    Complete by:  As directed    Diet - low sodium heart healthy    Complete by:  As directed    Increase activity slowly    Complete by:  As directed       Disposition: 01-Home or Self Care  Discharge Medications: Allergies as of 04/15/2017      Reactions   Latex Itching   Penicillins Itching, Rash   Has patient had a PCN reaction causing immediate rash, facial/tongue/throat swelling, SOB or lightheadedness with hypotension: Yes Has patient had a PCN reaction causing severe rash involving mucus membranes or skin necrosis: No Has patient had a PCN reaction that required hospitalization No Has patient had a PCN reaction occurring within the last 10 years: No If all of the above answers are "NO", then may proceed with Cephalosporin use.      Medication List    TAKE these medications   acetaminophen 500 MG tablet Commonly known as:  TYLENOL Take 1,000 mg by mouth every 6 (six) hours as needed for mild pain.   aspirin  81 MG tablet Take 81 mg by mouth daily.   atorvastatin 40 MG tablet Commonly known as:  LIPITOR Take 40 mg by mouth daily.   ferrous sulfate 325 (65 FE) MG EC tablet Take 325 mg by mouth 3 (three) times a week.   fluticasone 50 MCG/ACT nasal spray Commonly known as:  FLONASE Place 1 spray into both nostrils daily as needed for allergies or rhinitis.   multivitamin with minerals Tabs tablet Take 1 tablet by mouth 3 (three) times a week.   oxyCODONE 5 MG immediate release tablet Commonly known as:  Oxy IR/ROXICODONE Take 1-2 tablets (5-10 mg total) by mouth every 4 (four) hours as needed for moderate pain.   prochlorperazine 10 MG tablet Commonly known as:  COMPAZINE Take 1 tablet (10 mg total) by mouth every 6 (six) hours as needed (Nausea or vomiting). What changed:  reasons to take this   SYSTANE OP Place 1 drop into both eyes daily as needed (dry eyes).   Vitamin D 2000 units tablet Take 2,000 Units by mouth 3 (three) times a week. One by mouth twice a week            Discharge Care Instructions        Start     Ordered   04/15/17 0000  oxyCODONE (OXY IR/ROXICODONE) 5 MG immediate release tablet  Every 4 hours PRN  04/15/17 0903   04/15/17 0000  Increase activity slowly     04/15/17 0903   04/15/17 0000  Diet - low sodium heart healthy     04/15/17 0903   04/15/17 0000  Call MD for:  temperature >100.4     04/15/17 0903   04/15/17 0000  Call MD for:  persistant nausea and vomiting     04/15/17 0903   04/15/17 0000  Call MD for:  severe uncontrolled pain     04/15/17 0903   04/15/17 0000  Call MD for:  redness, tenderness, or signs of infection (pain, swelling, redness, odor or green/yellow discharge around incision site)     04/15/17 0903   04/15/17 0000  Call MD for:  difficulty breathing, headache or visual disturbances     04/15/17 0903       Follwup: Follow-up Information    Clayburn Pert, MD. Go in 6 day(s).   Specialty:  General  Surgery Why:  report to clinic Wed 10/3 at 2:00 for a 2:15 appt Contact information: [address] [place] [address] [phone]           Signed: Clayburn Pert 04/15/2017, 9:09 AM

## 2017-04-15 NOTE — Anesthesia Postprocedure Evaluation (Signed)
Anesthesia Post Note  Patient: Maria Patterson  Procedure(s) Performed: Procedure(s) (LRB): TOTAL MASTECTOMY (Left) AXILLARY SENTINEL NODE BIOPSY (Left)  Patient location during evaluation: PACU Anesthesia Type: General Level of consciousness: awake and alert Pain management: pain level controlled Vital Signs Assessment: post-procedure vital signs reviewed and stable Respiratory status: spontaneous breathing, nonlabored ventilation, respiratory function stable and patient connected to nasal cannula oxygen Cardiovascular status: blood pressure returned to baseline and stable Postop Assessment: no apparent nausea or vomiting Anesthetic complications: no     Last Vitals:  Vitals:   04/15/17 0053 04/15/17 0546  BP: (!) 124/52 122/61  Pulse: (!) 106 86  Resp: 16 18  Temp: 36.7 C 36.7 C  SpO2: 95% 100%    Last Pain:  Vitals:   04/15/17 0546  TempSrc: Oral  PainSc:                  Precious Haws [place]

## 2017-04-16 LAB — SURGICAL PATHOLOGY

## 2017-04-19 NOTE — Progress Notes (Signed)
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  INTERVAL HISTORY: Patient returns to clinic today for further evaluation and consideration of cycle 9 of 18 of maintenance Herceptin. She recently underwent mastectomy and was noted to have a complete pathologic response. She currently feels well and is asymptomatic. She continues to be anxious. She has no neurologic complaints. She denies any recent fevers or illnesses. She has a good appetite and denies weight loss. She has no chest pain or shortness of breath. She denies any nausea, vomiting, constipation, or diarrhea. She has no urinary complaints. Patient offers no further specific complaints today.  REVIEW OF SYSTEMS:   Review of Systems  Constitutional: Negative.  Negative for fever, malaise/fatigue and weight loss.  Respiratory: Negative.  Negative for cough and shortness of breath.   Cardiovascular: Negative.  Negative for chest pain and leg swelling.  Gastrointestinal: Negative.  Negative for abdominal pain.  Genitourinary: Negative.   Musculoskeletal: Negative.   Skin: Negative.  Negative for rash.  Neurological: Negative.  Negative for sensory change and weakness.  Psychiatric/Behavioral: Negative for depression. The patient is nervous/anxious.     As per HPI. Otherwise, a complete review of systems is negative.  PAST MEDICAL HISTORY: Past Medical History:  Diagnosis Date  . Anemia   . Breast cancer ([place])   . Breast mass, left 09/22/2016   RECOMMENDATION: Ultrasound-guided core biopsies of masses at left breast 2 o'clock 2 cm from nipple, left breast 2 o'clock 5 cm from nipple, abnormal left axillary lymph nodes.  Marland Kitchen GERD  (gastroesophageal reflux disease)   . Hyperglycemia   . Hyperlipidemia   . Obesity   . Postmenopausal     PAST SURGICAL HISTORY: Past Surgical History:  Procedure Laterality Date  . ABDOMINAL HYSTERECTOMY    . AXILLARY SENTINEL NODE BIOPSY Left 04/14/2017   Procedure: AXILLARY SENTINEL NODE BIOPSY;  Surgeon: Olean Ree, MD;  Location: [facility] ORS;  Service: General;  Laterality: Left;  . BREAST BIOPSY Left 09/30/2016   US biopsy of 3 areas + chemo  . ESOPHAGOGASTRODUODENOSCOPY (EGD) WITH PROPOFOL N/A 12/24/2014   Procedure: ESOPHAGOGASTRODUODENOSCOPY (EGD) WITH PROPOFOL;  Surgeon: Manya Silvas, MD;  Location: [facility] ENDOSCOPY;  Service: Endoscopy;  Laterality: N/A;  . PORTACATH PLACEMENT Right 10/27/2016   Procedure: INSERTION PORT-A-CATH;  Surgeon: Nestor Lewandowsky, MD;  Location: [facility] ORS;  Service: General;  Laterality: Right;  . TONSILECTOMY, ADENOIDECTOMY, BILATERAL MYRINGOTOMY AND TUBES    . TONSILLECTOMY    . TOTAL MASTECTOMY Left 04/14/2017   Procedure: TOTAL MASTECTOMY;  Surgeon: Olean Ree, MD;  Location: [facility] ORS;  Service: General;  Laterality: Left;    FAMILY HISTORY: Family History  Problem Relation Age of Onset  . Stroke Mother   . Stroke Father   . Cancer Neg Hx   . Depression Neg Hx     ADVANCED DIRECTIVES (Y/N):  N  HEALTH MAINTENANCE: Social History  Substance Use Topics  . Smoking status: Never Smoker  . Smokeless tobacco: Never Used  . Alcohol use No     Colonoscopy:  PAP:  Bone density:  Lipid panel:  Allergies  Allergen Reactions  . Latex Itching  . Penicillins Itching and Rash    Has patient had a PCN reaction causing  immediate rash, facial/tongue/throat swelling, SOB or lightheadedness with hypotension: Yes Has patient had a PCN reaction causing severe rash involving mucus membranes or skin necrosis: No Has patient had a PCN reaction that required hospitalization No Has patient had a PCN reaction occurring within the last 10 years: No If  all of the above answers are "NO", then may proceed with Cephalosporin use.     Current Outpatient Prescriptions  Medication Sig Dispense Refill  . acetaminophen (TYLENOL) 500 MG tablet Take 1,000 mg by mouth every 6 (six) hours as needed for mild pain.    Marland Kitchen aspirin 81 MG tablet Take 81 mg by mouth daily.    Marland Kitchen atorvastatin (LIPITOR) 40 MG tablet Take 40 mg by mouth daily.     . Cholecalciferol (VITAMIN D) 2000 units tablet Take 2,000 Units by mouth 3 (three) times a week. One by mouth twice a week    . ferrous sulfate 325 (65 FE) MG EC tablet Take 325 mg by mouth 3 (three) times a week.     . fluticasone (FLONASE) 50 MCG/ACT nasal spray Place 1 spray into both nostrils daily as needed for allergies or rhinitis.    . Multiple Vitamin (MULTIVITAMIN WITH MINERALS) TABS tablet Take 1 tablet by mouth 3 (three) times a week.     Vladimir Faster Glycol-Propyl Glycol (SYSTANE OP) Place 1 drop into both eyes daily as needed (dry eyes).     . prochlorperazine (COMPAZINE) 10 MG tablet Take 1 tablet (10 mg total) by mouth every 6 (six) hours as needed (Nausea or vomiting). (Patient taking differently: Take 10 mg by mouth every 6 (six) hours as needed for nausea or vomiting (Nausea or vomiting). ) 60 tablet 2  . oxyCODONE (OXY IR/ROXICODONE) 5 MG immediate release tablet Take 1-2 tablets (5-10 mg total) by mouth every 4 (four) hours as needed for moderate pain. 21 tablet 0   No current facility-administered medications for this visit.     OBJECTIVE: Vitals:   04/21/17 1137  BP: 128/70  Pulse: 81  Resp: 18  Temp: (!) 97.1 F (36.2 C)     Body mass index is 30.04 kg/m.    ECOG FS:0 - Asymptomatic  General: Well-developed, well-nourished, no acute distress. Eyes: Pink conjunctiva, anicteric sclera. Breasts: Left mastectomy with surgical dressing still in place. Chest wall: Port site without erythema or induration. Lungs: Clear to auscultation bilaterally. Heart: Regular rate and rhythm. No rubs,  murmurs, or gallops. Abdomen: Soft, nontender, nondistended. No organomegaly noted, normoactive bowel sounds. Musculoskeletal: No edema, cyanosis, or clubbing. Neuro: Alert, answering all questions appropriately. Cranial nerves grossly intact. Skin: No rashes or petechiae noted. Psych: Normal affect.  LAB RESULTS:  Lab Results  Component Value Date   NA 140 04/21/2017   K 3.9 04/21/2017   CL 110 04/21/2017   CO2 23 04/21/2017   GLUCOSE 83 04/21/2017   BUN 23 (H) 04/21/2017   CREATININE 1.24 (H) 04/21/2017   CALCIUM 9.2 04/21/2017   PROT 6.9 04/21/2017   ALBUMIN 3.5 04/21/2017   AST 19 04/21/2017   ALT 16 04/21/2017   ALKPHOS 126 04/21/2017   BILITOT 0.4 04/21/2017   GFRNONAA 41 (L) 04/21/2017   GFRAA 47 (L) 04/21/2017    Lab Results  Component Value Date   WBC 6.4 04/21/2017   NEUTROABS 3.5 04/21/2017   HGB 9.1 (L) 04/21/2017   HCT 26.8 (L) 04/21/2017   MCV 98.2 04/21/2017   PLT 189 04/21/2017     STUDIES: Ct Abdomen Pelvis W Contrast  Result Date: 04/02/2017 CLINICAL DATA:  Right lower quadrant pain. EXAM: CT ABDOMEN AND PELVIS WITH CONTRAST TECHNIQUE: Multidetector CT imaging of the abdomen and pelvis was performed using the standard protocol following bolus administration of intravenous contrast. CONTRAST:  20m ISOVUE-300 IOPAMIDOL (ISOVUE-300) INJECTION 61% COMPARISON:  03/17/2017. FINDINGS: Lower chest: No acute abnormality. Hepatobiliary: Small low-density structure in the left lobe of liver measures 4 mm. The gallbladder appears normal. No biliary dilatation. Pancreas: Unremarkable. No pancreatic ductal dilatation or surrounding inflammatory changes. Spleen: Normal in size without focal abnormality. Adrenals/Urinary Tract: Normal adrenal glands. Bilateral kidney cysts. No mass or hydronephrosis. The urinary bladder appears normal. Stomach/Bowel: Stomach is within normal limits. Appendix appears normal. No evidence of bowel wall thickening, distention, or  inflammatory changes. Vascular/Lymphatic: Aortic atherosclerosis. No enlarged abdominal or pelvic lymph nodes. No inguinal adenopathy. Reproductive: Status post hysterectomy. No adnexal masses. Other: There is a small fat containing umbilical hernia. No free fluid or fluid collections within the abdomen or pelvis. 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 There is a compression deformity involving scratch set chronic compression deformity and the the L1 vertebra. IMPRESSION: 1. No acute findings within the abdomen or pelvis. The appendix is visualized and appears normal. 2.  Aortic Atherosclerosis (ICD10-I70.0). Electronically Signed   By: TKerby MoorsM.D.   On: 04/02/2017 13:49   Nm Sentinel Node Injection  Result Date: 04/14/2017 CLINICAL DATA:  Left breast cancer. EXAM: NUCLEAR MEDICINE BREAST LYMPHOSCINTIGRAPHY TECHNIQUE: Intradermal injection of radiopharmaceutical was performed at the 12 o'clock, 3 o'clock, 6 o'clock, and 9 o'clock positions around the left nipple. The patient was then sent to the operating room where the sentinel node(s) were identified and removed by the surgeon. RADIOPHARMACEUTICALS:  Total of 1 mCi Millipore-filtered Technetium-[id number] colloid, injected in four aliquots of 0.25 mCi each. IMPRESSION: Uncomplicated intradermal injection of a total of 1 mCi Technetium-[id number] colloid for purposes of sentinel node identification. Electronically Signed   By: MarInez CatalinaD.   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  PLAN:    1. 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 She completed neoadjuvant Taxotere, carboplatinum, Herceptin, and Perjeta on February 17, 2017. She had a total  mastectomy on April 14, 2017, therefore does not require adjuvant XRT. Continue with maintenance Herceptin every 3 weeks for a total of 18 infusions.  An aromatase inhibitor would not offer benefit given the ER/PR negativity of her disease. Repeat MUGA scan on January 22, 2017 revealed an EF of 55% which was 10% less than previous. Continue to monitor closely. Repeat prior  to next infusion. Proceed with cycle 9 of Herceptin today. Return to clinic in 3 weeks for further evaluation and consideration of cycle 10. 2. Anemia: Secondary chemotherapy, monitor. 3. Leukocytosis: Resolved. Secondary to Neulasta. Monitor.  4. Renal insufficiency: Creatinine is only mildly elevated today, monitor. 5. Hypertension: Blood pressure is within normal limits. 6. Anxiety: Improved. Continue close follow-up with navigator.  Patient expressed understanding and was in agreement with this plan. She also understands that She can call clinic at any time with any questions, concerns, or complaints.   Cancer Staging Malignant neoplasm of upper-outer quadrant of left female breast ([employer] Medicaltaging form: Breast, AJCC 8th Edition - Clinical stage  from 10/11/2016: Stage IIB (cT2, cN1, cM0, G3, ER: Negative, PR: Negative, HER2: Positive) - Signed by Lloyd Huger, MD on 10/11/2016 - Pathologic stage from 04/23/2017: No Stage Recommended (ypT0, pN0, cM0, G3, ER: Negative, PR: Negative, HER2: Positive) - Signed by Lloyd Huger, MD on 04/23/2017   Lloyd Huger, MD   04/23/2017 9:12 AM

## 2017-04-19 NOTE — Telephone Encounter (Signed)
Spoke with Joelene Millin from [facility] and she did inform me that due to insurance, they are unable to take patient.  She will be assessed by Case Management in the hospital and will receive home health from a different agency that can take patient's insurance.

## 2017-04-21 ENCOUNTER — Encounter: Payer: Self-pay | Admitting: General Surgery

## 2017-04-21 ENCOUNTER — Inpatient Hospital Stay: Payer: Medicare Other

## 2017-04-21 ENCOUNTER — Inpatient Hospital Stay: Payer: Medicare Other | Attending: Oncology | Admitting: Oncology

## 2017-04-21 ENCOUNTER — Ambulatory Visit (INDEPENDENT_AMBULATORY_CARE_PROVIDER_SITE_OTHER): Payer: Medicare Other | Admitting: General Surgery

## 2017-04-21 VITALS — BP 133/72 | HR 92 | Temp 98.4°F | Ht 61.0 in | Wt 159.4 lb

## 2017-04-21 VITALS — BP 128/70 | HR 81 | Temp 97.1°F | Resp 18 | Wt 159.0 lb

## 2017-04-21 DIAGNOSIS — E785 Hyperlipidemia, unspecified: Secondary | ICD-10-CM | POA: Diagnosis not present

## 2017-04-21 DIAGNOSIS — K219 Gastro-esophageal reflux disease without esophagitis: Secondary | ICD-10-CM | POA: Diagnosis not present

## 2017-04-21 DIAGNOSIS — Z171 Estrogen receptor negative status [ER-]: Principal | ICD-10-CM

## 2017-04-21 DIAGNOSIS — I700 Atherosclerosis of aorta: Secondary | ICD-10-CM | POA: Insufficient documentation

## 2017-04-21 DIAGNOSIS — Z9013 Acquired absence of bilateral breasts and nipples: Secondary | ICD-10-CM | POA: Diagnosis not present

## 2017-04-21 DIAGNOSIS — D6481 Anemia due to antineoplastic chemotherapy: Secondary | ICD-10-CM | POA: Insufficient documentation

## 2017-04-21 DIAGNOSIS — K429 Umbilical hernia without obstruction or gangrene: Secondary | ICD-10-CM | POA: Diagnosis not present

## 2017-04-21 DIAGNOSIS — N189 Chronic kidney disease, unspecified: Secondary | ICD-10-CM | POA: Insufficient documentation

## 2017-04-21 DIAGNOSIS — Z7982 Long term (current) use of aspirin: Secondary | ICD-10-CM | POA: Diagnosis not present

## 2017-04-21 DIAGNOSIS — Z79899 Other long term (current) drug therapy: Secondary | ICD-10-CM | POA: Diagnosis not present

## 2017-04-21 DIAGNOSIS — C50412 Malignant neoplasm of upper-outer quadrant of left female breast: Secondary | ICD-10-CM | POA: Diagnosis not present

## 2017-04-21 DIAGNOSIS — Z4889 Encounter for other specified surgical aftercare: Secondary | ICD-10-CM

## 2017-04-21 DIAGNOSIS — E669 Obesity, unspecified: Secondary | ICD-10-CM | POA: Diagnosis not present

## 2017-04-21 DIAGNOSIS — F419 Anxiety disorder, unspecified: Secondary | ICD-10-CM | POA: Insufficient documentation

## 2017-04-21 DIAGNOSIS — Z5112 Encounter for antineoplastic immunotherapy: Secondary | ICD-10-CM | POA: Diagnosis not present

## 2017-04-21 DIAGNOSIS — N281 Cyst of kidney, acquired: Secondary | ICD-10-CM | POA: Insufficient documentation

## 2017-04-21 LAB — CBC WITH DIFFERENTIAL/PLATELET
Basophils Absolute: 0 10*3/uL (ref 0–0.1)
Basophils Relative: 0 %
Eosinophils Absolute: 0.2 10*3/uL (ref 0–0.7)
Eosinophils Relative: 2 %
HCT: 26.8 % — ABNORMAL LOW (ref 35.0–47.0)
HEMOGLOBIN: 9.1 g/dL — AB (ref 12.0–16.0)
LYMPHS ABS: 2.4 10*3/uL (ref 1.0–3.6)
LYMPHS PCT: 38 %
MCH: 33.3 pg (ref 26.0–34.0)
MCHC: 33.9 g/dL (ref 32.0–36.0)
MCV: 98.2 fL (ref 80.0–100.0)
Monocytes Absolute: 0.3 10*3/uL (ref 0.2–0.9)
Monocytes Relative: 5 %
NEUTROS PCT: 55 %
Neutro Abs: 3.5 10*3/uL (ref 1.4–6.5)
Platelets: 189 10*3/uL (ref 150–440)
RBC: 2.73 MIL/uL — AB (ref 3.80–5.20)
RDW: 15.6 % — ABNORMAL HIGH (ref 11.5–14.5)
WBC: 6.4 10*3/uL (ref 3.6–11.0)

## 2017-04-21 LAB — COMPREHENSIVE METABOLIC PANEL
ALK PHOS: 126 U/L (ref 38–126)
ALT: 16 U/L (ref 14–54)
AST: 19 U/L (ref 15–41)
Albumin: 3.5 g/dL (ref 3.5–5.0)
Anion gap: 7 (ref 5–15)
BUN: 23 mg/dL — AB (ref 6–20)
CALCIUM: 9.2 mg/dL (ref 8.9–10.3)
CO2: 23 mmol/L (ref 22–32)
CREATININE: 1.24 mg/dL — AB (ref 0.44–1.00)
Chloride: 110 mmol/L (ref 101–111)
GFR calc non Af Amer: 41 mL/min — ABNORMAL LOW (ref >60)
GFR, EST AFRICAN AMERICAN: 47 mL/min — AB (ref >60)
Glucose, Bld: 83 mg/dL (ref 65–99)
Potassium: 3.9 mmol/L (ref 3.5–5.1)
SODIUM: 140 mmol/L (ref 135–145)
Total Bilirubin: 0.4 mg/dL (ref 0.3–1.2)
Total Protein: 6.9 g/dL (ref 6.5–8.1)

## 2017-04-21 MED ORDER — DIPHENHYDRAMINE HCL 25 MG PO CAPS
25.0000 mg | ORAL_CAPSULE | Freq: Once | ORAL | Status: AC
  Administered 2017-04-21: 25 mg via ORAL
  Filled 2017-04-21: qty 1

## 2017-04-21 MED ORDER — TRASTUZUMAB CHEMO 150 MG IV SOLR
450.0000 mg | Freq: Once | INTRAVENOUS | Status: AC
  Administered 2017-04-21: 450 mg via INTRAVENOUS
  Filled 2017-04-21: qty 21.43

## 2017-04-21 MED ORDER — OXYCODONE HCL 5 MG PO TABS: 5.0000 mg | ORAL_TABLET | ORAL | 0 refills | Status: DC | PRN

## 2017-04-21 MED ORDER — SODIUM CHLORIDE 0.9 % IV SOLN
Freq: Once | INTRAVENOUS | Status: AC
Start: 2017-04-21 — End: 2017-04-21
  Administered 2017-04-21: 12:00:00 via INTRAVENOUS
  Filled 2017-04-21: qty 1000

## 2017-04-21 MED ORDER — SODIUM CHLORIDE 0.9% FLUSH
10.0000 mL | INTRAVENOUS | Status: DC | PRN
  Filled 2017-04-21: qty 10

## 2017-04-21 MED ORDER — HEPARIN SOD (PORK) LOCK FLUSH 100 UNIT/ML IV SOLN
500.0000 [IU] | Freq: Once | INTRAVENOUS | Status: AC | PRN
  Administered 2017-04-21: 500 [IU]
  Filled 2017-04-21: qty 5

## 2017-04-21 MED ORDER — ACETAMINOPHEN 325 MG PO TABS
650.0000 mg | ORAL_TABLET | Freq: Once | ORAL | Status: AC
  Administered 2017-04-21: 650 mg via ORAL
  Filled 2017-04-21: qty 2

## 2017-04-21 NOTE — Patient Instructions (Signed)
We have pulled one of your drains today. This area will continue to drain please change the dressing as needed.  We have sent you home with a pain medication prescription as well. Only fill it if you need to.  On Friday morning if you notice the drainage is very little please come in to be seen to have the other drain removed. If not we will see as scheduled on Monday to have that completed.   If you have any questions or concerns please give our office a call.

## 2017-04-21 NOTE — Progress Notes (Signed)
Outpatient Surgical Follow Up  04/21/2017  Maria Patterson is an 76 y.o. female.   Chief Complaint  Patient presents with  . Routine Post Op    Post-op: Left Mastectomy with SN Biopsy (04/14/17)- Dr. Hampton Abbot (Has 2 drains in place)    HPI: 76 year old female returns to clinic for follow-up 1 week status post left mastectomy for breast cancer. Patient reports doing well. She is eating well and having normal bowel function. She denies any fevers or chills. She has been keeping a log of her drain with decreasing amounts. She is very much looking forward to having one of her drains removed today.  Past Medical History:  Diagnosis Date  . Anemia   . Breast cancer ([place])   . Breast mass, left 09/22/2016   RECOMMENDATION: Ultrasound-guided core biopsies of masses at left breast 2 o'clock 2 cm from nipple, left breast 2 o'clock 5 cm from nipple, abnormal left axillary lymph nodes.  Marland Kitchen GERD (gastroesophageal reflux disease)   . Hyperglycemia   . Hyperlipidemia   . Obesity   . Postmenopausal     Past Surgical History:  Procedure Laterality Date  . ABDOMINAL HYSTERECTOMY    . AXILLARY SENTINEL NODE BIOPSY Left 04/14/2017   Procedure: AXILLARY SENTINEL NODE BIOPSY;  Surgeon: Olean Ree, MD;  Location: [facility] ORS;  Service: General;  Laterality: Left;  . BREAST BIOPSY Left 09/30/2016   US biopsy of 3 areas + chemo  . ESOPHAGOGASTRODUODENOSCOPY (EGD) WITH PROPOFOL N/A 12/24/2014   Procedure: ESOPHAGOGASTRODUODENOSCOPY (EGD) WITH PROPOFOL;  Surgeon: Manya Silvas, MD;  Location: [facility] ENDOSCOPY;  Service: Endoscopy;  Laterality: N/A;  . PORTACATH PLACEMENT Right 10/27/2016   Procedure: INSERTION PORT-A-CATH;  Surgeon: Nestor Lewandowsky, MD;  Location: [facility] ORS;  Service: General;  Laterality: Right;  . TONSILECTOMY, ADENOIDECTOMY, BILATERAL MYRINGOTOMY AND TUBES    . TONSILLECTOMY    . TOTAL MASTECTOMY Left 04/14/2017   Procedure: TOTAL MASTECTOMY;  Surgeon: Olean Ree, MD;  Location: [facility] ORS;   Service: General;  Laterality: Left;    Family History  Problem Relation Age of Onset  . Stroke Mother   . Stroke Father   . Cancer Neg Hx   . Depression Neg Hx     Social History:  reports that she has never smoked. She has never used smokeless tobacco. She reports that she does not drink alcohol or use drugs.  Allergies:  Allergies  Allergen Reactions  . Latex Itching  . Penicillins Itching and Rash    Has patient had a PCN reaction causing immediate rash, facial/tongue/throat swelling, SOB or lightheadedness with hypotension: Yes Has patient had a PCN reaction causing severe rash involving mucus membranes or skin necrosis: No Has patient had a PCN reaction that required hospitalization No Has patient had a PCN reaction occurring within the last 10 years: No If all of the above answers are "NO", then may proceed with Cephalosporin use.     Medications reviewed.    ROS A multipoint review of systems was completed, all pertinent positives and negatives are documented within the history of present illness and the remainder are negative   BP 133/72   Pulse 92   Temp 98.4 F (36.9 C) (Oral)   Ht '5\' 1"'  (1.549 m)   Wt 72.3 kg (159 lb 6.4 oz)   BMI 30.12 kg/m   Physical Exam Gen.: No acute distress Chest: Clear to auscultation  Breast: Left breast is surgically absent. Dermabond in place to the suture line without any evidence of erythema  or purulence. JP drains 2 in place with serous and was output. heart: Regular rate and rhythm Abdomen: Soft and nontender    Results for orders placed or performed in visit on 04/21/17 (from the past 48 hour(s))  CBC with Differential     Status: Abnormal   Collection Time: 04/21/17 10:17 AM  Result Value Ref Range   WBC 6.4 3.6 - 11.0 K/uL   RBC 2.73 (L) 3.80 - 5.20 MIL/uL   Hemoglobin 9.1 (L) 12.0 - 16.0 g/dL   HCT 26.8 (L) 35.0 - 47.0 %   MCV 98.2 80.0 - 100.0 fL   MCH 33.3 26.0 - 34.0 pg   MCHC 33.9 32.0 - 36.0 g/dL    RDW 15.6 (H) 11.5 - 14.5 %   Platelets 189 150 - 440 K/uL   Neutrophils Relative % 55 %   Neutro Abs 3.5 1.4 - 6.5 K/uL   Lymphocytes Relative 38 %   Lymphs Abs 2.4 1.0 - 3.6 K/uL   Monocytes Relative 5 %   Monocytes Absolute 0.3 0.2 - 0.9 K/uL   Eosinophils Relative 2 %   Eosinophils Absolute 0.2 0 - 0.7 K/uL   Basophils Relative 0 %   Basophils Absolute 0.0 0 - 0.1 K/uL  Comprehensive metabolic panel     Status: Abnormal   Collection Time: 04/21/17 10:17 AM  Result Value Ref Range   Sodium 140 135 - 145 mmol/L   Potassium 3.9 3.5 - 5.1 mmol/L   Chloride 110 101 - 111 mmol/L   CO2 23 22 - 32 mmol/L   Glucose, Bld 83 65 - 99 mg/dL   BUN 23 (H) 6 - 20 mg/dL   Creatinine, Ser 1.24 (H) 0.44 - 1.00 mg/dL   Calcium 9.2 8.9 - 10.3 mg/dL   Total Protein 6.9 6.5 - 8.1 g/dL   Albumin 3.5 3.5 - 5.0 g/dL   AST 19 15 - 41 U/L   ALT 16 14 - 54 U/L   Alkaline Phosphatase 126 38 - 126 U/L   Total Bilirubin 0.4 0.3 - 1.2 mg/dL   GFR calc non Af Amer 41 (L) >60 mL/min   GFR calc Af Amer 47 (L) >60 mL/min    Comment: (NOTE) The eGFR has been calculated using the CKD EPI equation. This calculation has not been validated in all clinical situations. eGFR's persistently <60 mL/min signify possible Chronic Kidney Disease.    Anion gap 7 5 - 15   No results found.  Assessment/Plan:  1. Aftercare following surgery 76 year old female status post left-sided mastectomy. Drain removed today in clinic without any difficulty. Discussed anticipated changes in drain output with the patient. Patient voiced understanding and she will call on Friday morning if the drain output of the remaining drain has decreased the amount her lower removal. Otherwise, she'll be scheduled for follow-up on Monday for repeat exam and hopeful removal of the drain. Refill for pain medications provided today.     Clayburn Pert, MD FACS General Surgeon  04/21/2017,3:42 PM

## 2017-04-21 NOTE — Progress Notes (Signed)
Patient denies any concerns today.  

## 2017-04-23 ENCOUNTER — Telehealth: Payer: Self-pay

## 2017-04-23 NOTE — Telephone Encounter (Signed)
Patient notified of appointment change and verbalized understanding . 04/26/17 @ 3pm with Dr.Davis.

## 2017-04-26 ENCOUNTER — Encounter: Payer: Self-pay | Admitting: Surgery

## 2017-04-26 ENCOUNTER — Ambulatory Visit: Payer: Self-pay

## 2017-04-26 ENCOUNTER — Ambulatory Visit (INDEPENDENT_AMBULATORY_CARE_PROVIDER_SITE_OTHER): Payer: Medicare Other | Admitting: Surgery

## 2017-04-26 VITALS — BP 144/72 | HR 96 | Temp 97.8°F | Wt 156.0 lb

## 2017-04-26 DIAGNOSIS — Z483 Aftercare following surgery for neoplasm: Secondary | ICD-10-CM

## 2017-04-26 DIAGNOSIS — C50412 Malignant neoplasm of upper-outer quadrant of left female breast: Secondary | ICD-10-CM

## 2017-04-26 DIAGNOSIS — Z171 Estrogen receptor negative status [ER-]: Secondary | ICD-10-CM

## 2017-04-26 NOTE — Patient Instructions (Signed)
Please give [place] a call in case you have any questions or concerns. We will see you back in a week to make sure that you are doing well.  Please remember to leave the dressing on for 48 hours before you remove it.

## 2017-04-28 NOTE — Progress Notes (Signed)
Surgical Clinic Progress/Follow-up Note   HPI:  76 y.o. pleasant Female presents to clinic for follow-up post-op evaluation 2 weeks s/p Left mastectomy with SLN biopsy (04/14/2017 by Dr. Hampton Abbot). Patient reports she was seen 5 days ago by Dr. Adonis Huguenin, who removed one of her 2 surgically placed drains, and was advised to follow up today for reassessment and likely removal of her remaining drain. Patient reports emptying 10 mL of pink non-purulent fluid from her drain once daily, says she's been avoiding any heavy lifting or strenuous activities as previously instructed, and denies any fever/chills, peri-incisional redness, N/V, CP, or SOB.  Review of Systems:  Constitutional: denies any other weight loss, fever, chills, or sweats  Eyes: denies any other vision changes, history of eye injury  ENT: denies sore throat, hearing problems  Respiratory: denies shortness of breath, wheezing  Cardiovascular: denies chest pain, palpitations  Gastrointestinal: denies abdominal pain, N/V, or diarrhea Musculoskeletal: denies any other joint pains or cramps  Skin: Denies any other rashes or skin discolorations  Neurological: denies any other headache, dizziness, weakness  Psychiatric: denies any other depression, anxiety  All other review of systems: otherwise negative   Vital Signs:  BP (!) 144/72   Pulse 96   Temp 97.8 F (36.6 C) (Oral)   Wt 156 lb (70.8 kg)   BMI 29.48 kg/m    Physical Exam:  Constitutional:  -- Normal body habitus  -- Awake, alert, and oriented x3  Eyes:  -- Pupils equally round and reactive to light  -- No scleral icterus  Ear, nose, throat:  -- No jugular venous distension  -- No nasal drainage, bleeding Pulmonary:  -- No crackles -- Equal breath sounds bilaterally -- Breathing non-labored at rest Cardiovascular:  -- S1, S2 present  -- No pericardial rubs  Breast: -- Left breast mastectomy and SLN incision healing well without erythema or drainage, minimal  peri-incisional tenderness to palpation, and drain well-secured with small amount of clear-serosanguinous fluid in the drain's bulb Gastrointestinal:  -- Soft, nontender, non-distended, no guarding/rebound  -- No abdominal masses appreciated, pulsatile or otherwise  Musculoskeletal / Integumentary:  -- Wounds or skin discoloration: None appreciated except post-surgical incision as described above (breast)  -- Extremities: B/L UE and LE FROM, hands and feet warm, no edema  Neurologic:  -- Motor function: intact and symmetric  -- Sensation: intact and symmetric   Assessment:  76 y.o. yo Female with a problem list including...  Patient Active Problem List   Diagnosis Date Noted  . Breast cancer, left ([place]) 04/14/2017  . Tachycardia 02/01/2017  . Anemia 02/01/2017  . Atherosclerosis of native arteries of extremity with intermittent claudication ([place]) 11/09/2016  . Essential hypertension, benign 11/07/2016  . Allergic rhinitis 11/07/2016  . Malignant neoplasm of upper-outer quadrant of left female breast ([place]) 10/08/2016  . Breast mass, left 09/22/2016  . Encounter for screening for HIV 09/18/2016  . Osteoporosis screening 09/18/2016  . Postmenopausal 09/18/2016  . Ache in joint 03/09/2016  . Overweight 03/09/2016  . Medication monitoring encounter 10/30/2015  . Hyperglycemia 09/10/2015  . GERD without esophagitis 07/01/2012  . Hyperlipidemia LDL goal <100 07/01/2012    presents to clinic for post-op follow-up evaluation, doing well s/p Left mastectomy with SLN biopsy.  Plan:   - gradually resume activities over the next 2 weeks   - okay to shower in 2 days and may submerge incisions under water prn (baths)  - second/final drain uneventfully removed without difficulty, dry gauze dressing applied  - return  to clinic once more in 1 week to assure wound and drain sites healing  - instructed to call office if any questions or concerns  All of the above recommendations were discussed  with the patient, and all of patient's questions were answered to her expressed satisfaction.  -- Marilynne Drivers Rosana Hoes, MD, [place]: [facility] - Partnering for exceptional care. Office: [phone]

## 2017-05-04 ENCOUNTER — Encounter: Payer: Self-pay | Admitting: Family Medicine

## 2017-05-04 ENCOUNTER — Ambulatory Visit (INDEPENDENT_AMBULATORY_CARE_PROVIDER_SITE_OTHER): Payer: Medicare Other | Admitting: Family Medicine

## 2017-05-04 VITALS — BP 132/84 | HR 89 | Temp 98.0°F | Resp 16 | Wt 155.7 lb

## 2017-05-04 DIAGNOSIS — E785 Hyperlipidemia, unspecified: Secondary | ICD-10-CM | POA: Diagnosis not present

## 2017-05-04 DIAGNOSIS — S32010S Wedge compression fracture of first lumbar vertebra, sequela: Secondary | ICD-10-CM

## 2017-05-04 DIAGNOSIS — M51369 Other intervertebral disc degeneration, lumbar region without mention of lumbar back pain or lower extremity pain: Secondary | ICD-10-CM

## 2017-05-04 DIAGNOSIS — C50412 Malignant neoplasm of upper-outer quadrant of left female breast: Secondary | ICD-10-CM

## 2017-05-04 DIAGNOSIS — S32010A Wedge compression fracture of first lumbar vertebra, initial encounter for closed fracture: Secondary | ICD-10-CM

## 2017-05-04 DIAGNOSIS — I700 Atherosclerosis of aorta: Secondary | ICD-10-CM | POA: Diagnosis not present

## 2017-05-04 DIAGNOSIS — M5136 Other intervertebral disc degeneration, lumbar region: Secondary | ICD-10-CM

## 2017-05-04 DIAGNOSIS — Z171 Estrogen receptor negative status [ER-]: Secondary | ICD-10-CM

## 2017-05-04 DIAGNOSIS — Z23 Encounter for immunization: Secondary | ICD-10-CM | POA: Diagnosis not present

## 2017-05-04 DIAGNOSIS — M4856XA Collapsed vertebra, not elsewhere classified, lumbar region, initial encounter for fracture: Secondary | ICD-10-CM | POA: Insufficient documentation

## 2017-05-04 DIAGNOSIS — R739 Hyperglycemia, unspecified: Secondary | ICD-10-CM | POA: Diagnosis not present

## 2017-05-04 DIAGNOSIS — I10 Essential (primary) hypertension: Secondary | ICD-10-CM

## 2017-05-04 DIAGNOSIS — D649 Anemia, unspecified: Secondary | ICD-10-CM | POA: Diagnosis not present

## 2017-05-04 HISTORY — DX: Wedge compression fracture of first lumbar vertebra, initial encounter for closed fracture: S32.010A

## 2017-05-04 HISTORY — DX: Collapsed vertebra, not elsewhere classified, lumbar region, initial encounter for fracture: M48.56XA

## 2017-05-04 HISTORY — DX: Hypomagnesemia: E83.42

## 2017-05-04 NOTE — Assessment & Plan Note (Signed)
Discussed finding on xray; will get DEXA scan and treat as indicated; 3 servings of calcium a day, 1000 iu of vit D3 daily; fall precautions

## 2017-05-04 NOTE — Assessment & Plan Note (Signed)
Check level and supplement if needed 

## 2017-05-04 NOTE — Assessment & Plan Note (Signed)
controlled 

## 2017-05-04 NOTE — Patient Instructions (Addendum)
Try to limit saturated fats in your diet (bologna, hot dogs, barbeque, cheeseburgers, hamburgers, steak, bacon, sausage, cheese, etc.) and get more fresh fruits, vegetables, and whole grains Bring back the set of 3 stool cards after you are through with them We'll have you see the gastroenterologist We'll get labs today If you have not heard anything from my staff in a week about any orders/referrals/studies from today, please contact us here to follow-up [phone] Try to get 3 servings of calcium a day Take 1000 iu of vitamin D3 daily Please do call to schedule your bone density study; the number to schedule one at either [facility] or [facility] is [phone] or [phone]  Fall Prevention in the Home Falls can cause injuries. They can happen to people of all ages. There are many things you can do to make your home safe and to help prevent falls. What can I do on the outside of my home?  Regularly fix the edges of walkways and driveways and fix any cracks.  Remove anything that might make you trip as you walk through a door, such as a raised step or threshold.  Trim any bushes or trees on the path to your home.  Use bright outdoor lighting.  Clear any walking paths of anything that might make someone trip, such as rocks or tools.  Regularly check to see if handrails are loose or broken. Make sure that both sides of any steps have handrails.  Any raised decks and porches should have guardrails on the edges.  Have any leaves, snow, or ice cleared regularly.  Use sand or salt on walking paths during winter.  Clean up any spills in your garage right away. This includes oil or grease spills. What can I do in the bathroom?  Use night lights.  Install grab bars by the toilet and in the tub and shower. Do not use towel bars as grab bars.  Use non-skid mats or decals in the tub or shower.  If you need to sit down in the shower, use a plastic,  non-slip stool.  Keep the floor dry. Clean up any water that spills on the floor as soon as it happens.  Remove soap buildup in the tub or shower regularly.  Attach bath mats securely with double-sided non-slip rug tape.  Do not have throw rugs and other things on the floor that can make you trip. What can I do in the bedroom?  Use night lights.  Make sure that you have a light by your bed that is easy to reach.  Do not use any sheets or blankets that are too big for your bed. They should not hang down onto the floor.  Have a firm chair that has side arms. You can use this for support while you get dressed.  Do not have throw rugs and other things on the floor that can make you trip. What can I do in the kitchen?  Clean up any spills right away.  Avoid walking on wet floors.  Keep items that you use a lot in easy-to-reach places.  If you need to reach something above you, use a strong step stool that has a grab bar.  Keep electrical cords out of the way.  Do not use floor polish or wax that makes floors slippery. If you must use wax, use non-skid floor wax.  Do not have throw rugs and other things on the floor that can make you trip.  What can I do with my stairs?  Do not leave any items on the stairs.  Make sure that there are handrails on both sides of the stairs and use them. Fix handrails that are broken or loose. Make sure that handrails are as long as the stairways.  Check any carpeting to make sure that it is firmly attached to the stairs. Fix any carpet that is loose or worn.  Avoid having throw rugs at the top or bottom of the stairs. If you do have throw rugs, attach them to the floor with carpet tape.  Make sure that you have a light switch at the top of the stairs and the bottom of the stairs. If you do not have them, ask someone to add them for you. What else can I do to help prevent falls?  Wear shoes that: ? Do not have high heels. ? Have rubber  bottoms. ? Are comfortable and fit you well. ? Are closed at the toe. Do not wear sandals.  If you use a stepladder: ? Make sure that it is fully opened. Do not climb a closed stepladder. ? Make sure that both sides of the stepladder are locked into place. ? Ask someone to hold it for you, if possible.  Clearly mark and make sure that you can see: ? Any grab bars or handrails. ? First and last steps. ? Where the edge of each step is.  Use tools that help you move around (mobility aids) if they are needed. These include: ? Canes. ? Walkers. ? Scooters. ? Crutches.  Turn on the lights when you go into a dark area. Replace any light bulbs as soon as they burn out.  Set up your furniture so you have a clear path. Avoid moving your furniture around.  If any of your floors are uneven, fix them.  If there are any pets around you, be aware of where they are.  Review your medicines with your doctor. Some medicines can make you feel dizzy. This can increase your chance of falling. Ask your doctor what other things that you can do to help prevent falls. This information is not intended to replace advice given to you by your health care provider. Make sure you discuss any questions you have with your health care provider. Document Released: 05/02/2009 Document Revised: 12/12/2015 Document Reviewed: 08/10/2014 Elsevier Interactive Patient Education  [employer].

## 2017-05-04 NOTE — Progress Notes (Signed)
BP 132/84   Pulse 89   Temp 98 F (36.7 C) (Oral)   Resp 16   Wt 155 lb 11.2 oz (70.6 kg)   SpO2 97%   BMI 29.42 kg/m    Subjective:    Patient ID: Maria Patterson, female    DOB: 1940-12-24, 76 y.o.   MRN: [id number]  HPI: Maria Patterson is a 76 y.o. female  Chief Complaint  Patient presents with  . Follow-up    HPI Patient is here for f/u She has had the PCV-13 pneumonia shot; no record of the PPSV-23 in our records Was taking magnesium but gave dark spots Oxycodone was after her surgery, breast surgery, healing okay, feeling some pain at times Anemia; last H/H 13 days 9.1/26.8; tired; she is interested in a colonoscopy; has not done stool cards; had some pain in the RLQ, almost to the hip joint; no ibuprofen or motrin; just had CT scan done Sept, reviewed which showed compression deformity of L1, aortic athero, and DDD High cholesterol; sometimes eats a little fatty meat but not much Hx of low magnesium Hyperglycemia; no diabetes in the family HTN; not using much salt  Depression screen [facility] 2/9 05/04/2017 02/01/2017 11/06/2016 09/18/2016 09/01/2016  Decreased Interest 0 0 0 0 0  Down, Depressed, Hopeless 0 1 0 0 0  PHQ - 2 Score 0 1 0 0 0    Relevant past medical, surgical, family and social history reviewed Past Medical History:  Diagnosis Date  . Anemia   . Breast cancer ([place])   . Breast mass, left 09/22/2016   RECOMMENDATION: Ultrasound-guided core biopsies of masses at left breast 2 o'clock 2 cm from nipple, left breast 2 o'clock 5 cm from nipple, abnormal left axillary lymph nodes.  Marland Kitchen GERD (gastroesophageal reflux disease)   . Hyperglycemia   . Hyperlipidemia   . Obesity   . Postmenopausal    Past Surgical History:  Procedure Laterality Date  . ABDOMINAL HYSTERECTOMY    . AXILLARY SENTINEL NODE BIOPSY Left 04/14/2017   Procedure: AXILLARY SENTINEL NODE BIOPSY;  Surgeon: Olean Ree, MD;  Location: [facility] ORS;  Service: General;  Laterality: Left;  . BREAST  BIOPSY Left 09/30/2016   US biopsy of 3 areas + chemo  . ESOPHAGOGASTRODUODENOSCOPY (EGD) WITH PROPOFOL N/A 12/24/2014   Procedure: ESOPHAGOGASTRODUODENOSCOPY (EGD) WITH PROPOFOL;  Surgeon: Manya Silvas, MD;  [facility] END[facility]COPY;  Service: Endoscopy;  Laterality: N/A;  . PORTACATH PLACEMENT Right 10/27/2016   Procedure: INSERTION PORT-A-CATH;  Surgeon: Nestor Lewandowsky, MD;  Location: [facility] ORS;  Service: General;  Laterality: Right;  . TONSILECTOMY, ADENOIDECTOMY, BILATERAL MYRINGOTOMY AND TUBES    . TONSILLECTOMY    . TOTAL MASTECTOMY Left 04/14/2017   Procedure: TOTAL MASTECTOMY;  Surgeon: Olean Ree, MD;  Location: [facility] ORS;  Service: General;  Laterality: Left;   Family History  Problem Relation Age of Onset  . Stroke Mother   . Stroke Father   . Cancer Neg Hx   . Depression Neg Hx    Social History   Social History  . Marital status: Widowed    Spouse name: N/A  . Number of children: N/A  . Years of education: N/A   Occupational History  . Not on file.   Social History Main Topics  . Smoking status: Never Smoker  . Smokeless tobacco: Never Used  . Alcohol use No  . Drug use: No  . Sexual activity: No   Other Topics Concern  . Not on file  Social History Narrative  . No narrative on file    Interim medical history since last visit reviewed. Allergies and medications reviewed  Review of Systems  Constitutional: Negative for unexpected weight change.  Gastrointestinal: Negative for blood in stool and constipation.  Genitourinary: Negative for hematuria.   Per HPI unless specifically indicated above     Objective:    BP 132/84   Pulse 89   Temp 98 F (36.7 C) (Oral)   Resp 16   Wt 155 lb 11.2 oz (70.6 kg)   SpO2 97%   BMI 29.42 kg/m   Wt Readings from Last 3 Encounters:  05/04/17 155 lb 11.2 oz (70.6 kg)  04/26/17 156 lb (70.8 kg)  04/21/17 159 lb 6.4 oz (72.3 kg)    Physical Exam  Constitutional: She appears well-developed and  well-nourished.  HENT:  Head: Normocephalic and atraumatic.  Mouth/Throat: Oropharynx is clear and moist and mucous membranes are normal.  Eyes: EOM are normal. No scleral icterus.  Cardiovascular: Normal rate and regular rhythm.   Pulmonary/Chest: Effort normal and breath sounds normal.  Abdominal: Soft. Bowel sounds are normal. She exhibits no distension. There is no tenderness. There is no guarding.  Musculoskeletal: She exhibits no edema.  Neurological: She is alert.  Skin: There is pallor (nailbed pallor noted).  Psychiatric: She has a normal mood and affect. Her behavior is normal.    Results for orders placed or performed in visit on 04/21/17  CBC with Differential  Result Value Ref Range   WBC 6.4 3.6 - 11.0 K/uL   RBC 2.73 (L) 3.80 - 5.20 MIL/uL   Hemoglobin 9.1 (L) 12.0 - 16.0 g/dL   HCT 26.8 (L) 35.0 - 47.0 %   MCV 98.2 80.0 - 100.0 fL   MCH 33.3 26.0 - 34.0 pg   MCHC 33.9 32.0 - 36.0 g/dL   RDW 15.6 (H) 11.5 - 14.5 %   Platelets 189 150 - 440 K/uL   Neutrophils Relative % 55 %   Neutro Abs 3.5 1.4 - 6.5 K/uL   Lymphocytes Relative 38 %   Lymphs Abs 2.4 1.0 - 3.6 K/uL   Monocytes Relative 5 %   Monocytes Absolute 0.3 0.2 - 0.9 K/uL   Eosinophils Relative 2 %   Eosinophils Absolute 0.2 0 - 0.7 K/uL   Basophils Relative 0 %   Basophils Absolute 0.0 0 - 0.1 K/uL  Comprehensive metabolic panel  Result Value Ref Range   Sodium 140 135 - 145 mmol/L   Potassium 3.9 3.5 - 5.1 mmol/L   Chloride 110 101 - 111 mmol/L   CO2 23 22 - 32 mmol/L   Glucose, Bld 83 65 - 99 mg/dL   BUN 23 (H) 6 - 20 mg/dL   Creatinine, Ser 1.24 (H) 0.44 - 1.00 mg/dL   Calcium 9.2 8.9 - 10.3 mg/dL   Total Protein 6.9 6.5 - 8.1 g/dL   Albumin 3.5 3.5 - 5.0 g/dL   AST 19 15 - 41 U/L   ALT 16 14 - 54 U/L   Alkaline Phosphatase 126 38 - 126 U/L   Total Bilirubin 0.4 0.3 - 1.2 mg/dL   GFR calc non Af Amer 41 (L) >60 mL/min   GFR calc Af Amer 47 (L) >60 mL/min   Anion gap 7 5 - 15        Assessment & Plan:   Problem List Items Addressed This Visit      Cardiovascular and Mediastinum   Essential hypertension, benign  controlled      Aortic atherosclerosis ([place])    Discussed with patient; goal LDL is less than 70        Musculoskeletal and Integument   Degenerative disc disease, lumbar    Noted on CT scan; discussed w/pt      Compression fracture of first lumbar vertebra (HCC)    Discussed finding on xray; will get DEXA scan and treat as indicated; 3 servings of calcium a day, 1000 iu of vit D3 daily; fall precautions      Relevant Orders   DG Bone Density     Other   Hypomagnesemia    Check level and supplement if needed      Relevant Orders   Magnesium   Hyperlipidemia LDL goal <100 - Primary    Check lipids and avoid saturated fats      Relevant Orders   Lipid panel   Hyperglycemia    Check A1c      Relevant Orders   Basic metabolic panel   Hemoglobin A1c   Breast cancer, left (HCC)    Undergoing treatment with Dr. Grayland Ormond      Anemia    Continue iron supplement; check CBC and ferritin and iron panel; check stool cards; refer to GI for colonoscopy      Relevant Orders   Ambulatory referral to Gastroenterology   CBC with Differential/Platelet   Fe+TIBC+Fer    Other Visit Diagnoses    Need for 23-polyvalent pneumococcal polysaccharide vaccine       Relevant Orders   Pneumococcal polysaccharide vaccine 23-valent greater than or equal to 2yo subcutaneous/IM (Completed)       Follow up plan: Return in about 6 months (around 11/02/2017) for twenty minute follow-up with fasting labs, sooner if any issues.  An after-visit summary was printed and given to the patient at [place].  Please see the patient instructions which may contain other information and recommendations beyond what is mentioned above in the assessment and plan.  No orders of the defined types were placed in this encounter.   Orders Placed This Encounter   Procedures  . DG Bone Density  . Pneumococcal polysaccharide vaccine 23-valent greater than or equal to 2yo subcutaneous/IM  . CBC with Differential/Platelet  . Basic metabolic panel  . Lipid panel  . Hemoglobin A1c  . Magnesium  . Fe+TIBC+Fer  . Ambulatory referral to Gastroenterology

## 2017-05-04 NOTE — Assessment & Plan Note (Signed)
Undergoing treatment with Dr. Grayland Ormond

## 2017-05-04 NOTE — Assessment & Plan Note (Addendum)
Continue iron supplement; check CBC and ferritin and iron panel; check stool cards; refer to GI for colonoscopy

## 2017-05-04 NOTE — Assessment & Plan Note (Signed)
Noted on CT scan; discussed w/pt

## 2017-05-04 NOTE — Assessment & Plan Note (Signed)
Check A1c. 

## 2017-05-04 NOTE — Assessment & Plan Note (Signed)
Discussed with patient; goal LDL is less than 70

## 2017-05-04 NOTE — Assessment & Plan Note (Signed)
Check lipids and avoid saturated fats

## 2017-05-05 ENCOUNTER — Ambulatory Visit
Admission: RE | Admit: 2017-05-05 | Discharge: 2017-05-05 | Disposition: A | Discharge status: Home / Self Care | Payer: Medicare Other | Source: Ambulatory Visit | Attending: Oncology | Admitting: Oncology

## 2017-05-05 ENCOUNTER — Other Ambulatory Visit: Payer: Self-pay

## 2017-05-05 ENCOUNTER — Ambulatory Visit (INDEPENDENT_AMBULATORY_CARE_PROVIDER_SITE_OTHER): Payer: Medicare Other | Admitting: General Surgery

## 2017-05-05 ENCOUNTER — Encounter: Payer: Self-pay | Admitting: General Surgery

## 2017-05-05 VITALS — BP 163/80 | HR 92 | Temp 98.9°F | Ht 61.0 in | Wt 156.4 lb

## 2017-05-05 DIAGNOSIS — E785 Hyperlipidemia, unspecified: Secondary | ICD-10-CM

## 2017-05-05 DIAGNOSIS — C50412 Malignant neoplasm of upper-outer quadrant of left female breast: Secondary | ICD-10-CM | POA: Insufficient documentation

## 2017-05-05 DIAGNOSIS — Z171 Estrogen receptor negative status [ER-]: Secondary | ICD-10-CM | POA: Diagnosis not present

## 2017-05-05 DIAGNOSIS — D649 Anemia, unspecified: Secondary | ICD-10-CM

## 2017-05-05 DIAGNOSIS — Z4889 Encounter for other specified surgical aftercare: Secondary | ICD-10-CM

## 2017-05-05 DIAGNOSIS — C50912 Malignant neoplasm of unspecified site of left female breast: Secondary | ICD-10-CM | POA: Diagnosis not present

## 2017-05-05 DIAGNOSIS — Z5181 Encounter for therapeutic drug level monitoring: Secondary | ICD-10-CM

## 2017-05-05 LAB — BASIC METABOLIC PANEL
BUN/Creatinine Ratio: 16 (calc) (ref 6–22)
BUN: 22 mg/dL (ref 7–25)
CHLORIDE: 109 mmol/L (ref 98–110)
CO2: 26 mmol/L (ref 20–32)
CREATININE: 1.4 mg/dL — AB (ref 0.60–0.93)
Calcium: 9.4 mg/dL (ref 8.6–10.4)
Glucose, Bld: 83 mg/dL (ref 65–99)
POTASSIUM: 4.2 mmol/L (ref 3.5–5.3)
SODIUM: 142 mmol/L (ref 135–146)

## 2017-05-05 LAB — CBC WITH DIFFERENTIAL/PLATELET
BASOS ABS: 9 {cells}/uL (ref 0–200)
Basophils Relative: 0.2 %
EOS PCT: 1.8 %
Eosinophils Absolute: 83 cells/uL (ref 15–500)
HEMATOCRIT: 30 % — AB (ref 35.0–45.0)
HEMOGLOBIN: 9.8 g/dL — AB (ref 11.7–15.5)
LYMPHS ABS: 1987 {cells}/uL (ref 850–3900)
MCH: 31.6 pg (ref 27.0–33.0)
MCHC: 32.7 g/dL (ref 32.0–36.0)
MCV: 96.8 fL (ref 80.0–100.0)
MPV: 9.7 fL (ref 7.5–12.5)
Monocytes Relative: 5.5 %
NEUTROS ABS: 2268 {cells}/uL (ref 1500–7800)
Neutrophils Relative %: 49.3 %
Platelets: 208 10*3/uL (ref 140–400)
RBC: 3.1 10*6/uL — AB (ref 3.80–5.10)
RDW: 13.3 % (ref 11.0–15.0)
Total Lymphocyte: 43.2 %
WBC: 4.6 10*3/uL (ref 3.8–10.8)
WBCMIX: 253 {cells}/uL (ref 200–950)

## 2017-05-05 LAB — LIPID PANEL
CHOL/HDL RATIO: 3.6 (calc) (ref <5.0)
Cholesterol: 248 mg/dL — ABNORMAL HIGH (ref <200)
HDL: 69 mg/dL (ref >50)
LDL CHOLESTEROL (CALC): 156 mg/dL — AB
Non-HDL Cholesterol (Calc): 179 mg/dL (calc) — ABNORMAL HIGH (ref <130)
Triglycerides: 114 mg/dL (ref <150)

## 2017-05-05 LAB — HEMOGLOBIN A1C
HEMOGLOBIN A1C: 5.1 %{Hb} (ref <5.7)
Mean Plasma Glucose: 100 (calc)
eAG (mmol/L): 5.5 (calc)

## 2017-05-05 LAB — MAGNESIUM: MAGNESIUM: 1.9 mg/dL (ref 1.5–2.5)

## 2017-05-05 LAB — IRON,TIBC AND FERRITIN PANEL
%SAT: 23 % (ref 11–50)
FERRITIN: 282 ng/mL (ref 20–288)
IRON: 52 ug/dL (ref 45–160)
TIBC: 230 ug/dL — AB (ref 250–450)

## 2017-05-05 MED ORDER — TECHNETIUM TC 99M-LABELED RED BLOOD CELLS IV KIT
22.6900 | PACK | Freq: Once | INTRAVENOUS | Status: AC | PRN
  Administered 2017-05-05: 22.69 via INTRAVENOUS

## 2017-05-05 NOTE — Progress Notes (Signed)
Outpatient Surgical Follow Up  05/05/2017  Maria Patterson is an 76 y.o. female.   Chief Complaint  Patient presents with  . Routine Post Op    Left Mastectomy W/ sentinel node biopsy-Dr.Piscoya-04/14/17    HPI: 76 year old female returns to clinic for follow-up now 3 weeks status post left mastectomy with sentinel lymph node biopsy. Her last drain was removed during her last visit. She continues to have some discomfort for which she is taking pain medications but states it is improving. She is concerned about the appearance and the sunken and feeling under her arm. She also has a burning sensation going down her left arm. She denies any fevers, chills, nausea, vomiting, chest pain, shortness of breath, diarrhea, constipation. She's not had any drainage from the incision sites for over a week and she has been bathing.  Past Medical History:  Diagnosis Date  . Anemia   . Breast cancer ([place])   . Breast mass, left 09/22/2016   RECOMMENDATION: Ultrasound-guided core biopsies of masses at left breast 2 o'clock 2 cm from nipple, left breast 2 o'clock 5 cm from nipple, abnormal left axillary lymph nodes.  Marland Kitchen GERD (gastroesophageal reflux disease)   . Hyperglycemia   . Hyperlipidemia   . Obesity   . Postmenopausal     Past Surgical History:  Procedure Laterality Date  . ABDOMINAL HYSTERECTOMY    . AXILLARY SENTINEL NODE BIOPSY Left 04/14/2017   Procedure: AXILLARY SENTINEL NODE BIOPSY;  Surgeon: Olean Ree, MD;  Location: [facility] ORS;  Service: General;  Laterality: Left;  . BREAST BIOPSY Left 09/30/2016   US biopsy of 3 areas + chemo  . ESOPHAGOGASTRODUODENOSCOPY (EGD) WITH PROPOFOL N/A 12/24/2014   Procedure: ESOPHAGOGASTRODUODENOSCOPY (EGD) WITH PROPOFOL;  Surgeon: Manya Silvas, MD;  Location: [facility] ENDOSCOPY;  Service: Endoscopy;  Laterality: N/A;  . PORTACATH PLACEMENT Right 10/27/2016   Procedure: INSERTION PORT-A-CATH;  Surgeon: Nestor Lewandowsky, MD;  Location: [facility] ORS;  Service: General;   Laterality: Right;  . TONSILECTOMY, ADENOIDECTOMY, BILATERAL MYRINGOTOMY AND TUBES    . TONSILLECTOMY    . TOTAL MASTECTOMY Left 04/14/2017   Procedure: TOTAL MASTECTOMY;  Surgeon: Olean Ree, MD;  Location: [facility] ORS;  Service: General;  Laterality: Left;    Family History  Problem Relation Age of Onset  . Stroke Mother   . Stroke Father   . Cancer Neg Hx   . Depression Neg Hx     Social History:  reports that she has never smoked. She has never used smokeless tobacco. She reports that she does not drink alcohol or use drugs.  Allergies:  Allergies  Allergen Reactions  . Latex Itching  . Penicillins Itching and Rash    Has patient had a PCN reaction causing immediate rash, facial/tongue/throat swelling, SOB or lightheadedness with hypotension: Yes Has patient had a PCN reaction causing severe rash involving mucus membranes or skin necrosis: No Has patient had a PCN reaction that required hospitalization No Has patient had a PCN reaction occurring within the last 10 years: No If all of the above answers are "NO", then may proceed with Cephalosporin use.     Medications reviewed.    ROS A multipoint review of systems was completed, all pertinent positives and negatives are documented within the history of present illness and remainder are negative   BP (!) 163/80   Pulse 92   Temp 98.9 F (37.2 C) (Oral)   Ht 5\' 1"  (1.549 m)   Wt 70.9 kg (156 lb 6.4 oz)  BMI 29.55 kg/m   Physical Exam Gen.: No acute distress Chest: Clear to auscultation Heart: Regular rate and rhythm Breast: Left breast surgically absent with Dermabond still in place to mastectomy incision site and left axillary incision site. No evidence of erythema or drainage.multiple areas of visible distortion of the deeper tissues but without palpable fluctuance    Results for orders placed or performed in visit on 05/04/17 (from the past 48 hour(s))  CBC with Differential/Platelet     Status: Abnormal    Collection Time: 05/04/17 12:00 AM  Result Value Ref Range   WBC 4.6 3.8 - 10.8 Thousand/uL   RBC 3.10 (L) 3.80 - 5.10 Million/uL   Hemoglobin 9.8 (L) 11.7 - 15.5 g/dL   HCT 30.0 (L) 35.0 - 45.0 %   MCV 96.8 80.0 - 100.0 fL   MCH 31.6 27.0 - 33.0 pg   MCHC 32.7 32.0 - 36.0 g/dL   RDW 13.3 11.0 - 15.0 %   Platelets 208 140 - 400 Thousand/uL   MPV 9.7 7.5 - 12.5 fL   Neutro Abs 2,268 1,500 - 7,800 cells/uL   Lymphs Abs 1,987 850 - 3,900 cells/uL   WBC mixed population 253 200 - 950 cells/uL   Eosinophils Absolute 83 15 - 500 cells/uL   Basophils Absolute 9 0 - 200 cells/uL   Neutrophils Relative % 49.3 %   Total Lymphocyte 43.2 %   Monocytes Relative 5.5 %   Eosinophils Relative 1.8 %   Basophils Relative 0.2 %  Basic metabolic panel     Status: Abnormal   Collection Time: 05/04/17 12:00 AM  Result Value Ref Range   Glucose, Bld 83 65 - 99 mg/dL    Comment: .            Fasting reference interval .    BUN 22 7 - 25 mg/dL   Creat 1.40 (H) 0.60 - 0.93 mg/dL    Comment: For patients >5 years of age, the reference limit for Creatinine is approximately 13% higher for people identified as African-American. .    BUN/Creatinine Ratio 16 6 - 22 (calc)   Sodium 142 135 - 146 mmol/L   Potassium 4.2 3.5 - 5.3 mmol/L   Chloride 109 98 - 110 mmol/L   CO2 26 20 - 32 mmol/L   Calcium 9.4 8.6 - 10.4 mg/dL  Lipid panel     Status: Abnormal   Collection Time: 05/04/17 12:00 AM  Result Value Ref Range   Cholesterol 248 (H) <200 mg/dL   HDL 69 >50 mg/dL   Triglycerides 114 <150 mg/dL   LDL Cholesterol (Calc) 156 (H) mg/dL (calc)    Comment: Reference range: <100 . Desirable range <100 mg/dL for primary prevention;   <70 mg/dL for patients with CHD or diabetic patients  with > or = 2 CHD risk factors. Marland Kitchen LDL-C is now calculated using the Martin-Hopkins  calculation, which is a validated novel method providing  better accuracy than the Friedewald equation in the  estimation of  LDL-C.  Cresenciano Genre et al. Annamaria Helling. 3536;144(31): 2061-2068  (http://education.[employer].com/faq/FAQ164)    Total CHOL/HDL Ratio 3.6 <5.0 (calc)   Non-HDL Cholesterol (Calc) 179 (H) <130 mg/dL (calc)    Comment: For patients with diabetes plus 1 major ASCVD risk  factor, treating to a non-HDL-C goal of <100 mg/dL  (LDL-C of <70 mg/dL) is considered a therapeutic  option.   Hemoglobin A1c     Status: None   Collection Time: 05/04/17 12:00 AM  Result Value  Ref Range   Hgb A1c MFr Bld 5.1 <5.7 % of total Hgb    Comment: For the purpose of screening for the presence of diabetes: . <5.7%       Consistent with the absence of diabetes 5.7-6.4%    Consistent with increased risk for diabetes             (prediabetes) > or =6.5%  Consistent with diabetes . This assay result is consistent with a decreased risk of diabetes. . Currently, no consensus exists regarding use of hemoglobin A1c for diagnosis of diabetes in children. . According to American Diabetes Association (ADA) guidelines, hemoglobin A1c <7.0% represents optimal control in non-pregnant diabetic patients. Different metrics may apply to specific patient populations.  Standards of Medical Care in Diabetes(ADA). .    Mean Plasma Glucose 100 (calc)   eAG (mmol/L) 5.5 (calc)  Magnesium     Status: None   Collection Time: 05/04/17 12:00 AM  Result Value Ref Range   Magnesium 1.9 1.5 - 2.5 mg/dL  Fe+TIBC+Fer     Status: Abnormal   Collection Time: 05/04/17 12:00 AM  Result Value Ref Range   Iron 52 45 - 160 mcg/dL   TIBC 230 (L) 250 - 450 mcg/dL (calc)   %SAT 23 11 - 50 % (calc)   Ferritin 282 20 - 288 ng/mL   No results found.  Assessment/Plan:  1. Aftercare following surgery 76 year old female status post a left mastectomy with sentinel lymph node biopsy. Doing well. Discussed signs and symptoms of infection and to return to clinic immediately. Otherwise she'll follow-up in clinic in 2 weeks with her operative  surgeon for an additional wound check and to discuss continued therapies. Also discussed that when oncology is done with her Chemo-Port that we could remove her port at her convenience.     Clayburn Pert, MD FACS General Surgeon  05/05/2017,9:50 AM

## 2017-05-05 NOTE — Patient Instructions (Signed)
If you have any questions or concerns please call our office. Please see your follow up appointment listed below.

## 2017-05-11 NOTE — Progress Notes (Signed)
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  INTERVAL HISTORY: Patient returns to clinic today for further evaluation and consideration of cycle 10 of 18 of maintenance Herceptin. She recently underwent mastectomy and was noted to have a complete pathologic response. She currently feels well and is asymptomatic. She continues to be anxious. She has no neurologic complaints. She denies any recent fevers or illnesses. She has a good appetite and denies weight loss. She has no chest pain or shortness of breath. She denies any nausea, vomiting, constipation, or diarrhea. She has no urinary complaints. Patient offers no further specific complaints today.  REVIEW OF SYSTEMS:   Review of Systems  Constitutional: Negative.  Negative for fever, malaise/fatigue and weight loss.  Respiratory: Negative.  Negative for cough and shortness of breath.   Cardiovascular: Negative.  Negative for chest pain and leg swelling.  Gastrointestinal: Negative.  Negative for abdominal pain.  Genitourinary: Negative.   Musculoskeletal: Negative.   Skin: Negative.  Negative for rash.  Neurological: Negative.  Negative for sensory change and weakness.  Psychiatric/Behavioral: Negative for depression. The patient is nervous/anxious.     As per HPI. Otherwise, a complete review of systems is negative.  PAST MEDICAL HISTORY: Past Medical History:  Diagnosis Date  . Anemia   . Breast cancer ([place])   . Breast mass, left 09/22/2016   RECOMMENDATION: Ultrasound-guided core biopsies of masses at  left breast 2 o'clock 2 cm from nipple, left breast 2 o'clock 5 cm from nipple, abnormal left axillary lymph nodes.  Marland Kitchen GERD (gastroesophageal reflux disease)   . Hyperglycemia   . Hyperlipidemia   . Obesity   . Postmenopausal     PAST SURGICAL HISTORY: Past Surgical History:  Procedure Laterality Date  . ABDOMINAL HYSTERECTOMY    . AXILLARY SENTINEL NODE BIOPSY Left 04/14/2017   Procedure: AXILLARY SENTINEL NODE BIOPSY;  Surgeon: Olean Ree, MD;  Location: [facility] ORS;  Service: General;  Laterality: Left;  . BREAST BIOPSY Left 09/30/2016   US biopsy of 3 areas + chemo  . ESOPHAGOGASTRODUODENOSCOPY (EGD) WITH PROPOFOL N/A 12/24/2014   Procedure: ESOPHAGOGASTRODUODENOSCOPY (EGD) WITH PROPOFOL;  Surgeon: Manya Silvas, MD;  Location: [facility];  Service: Endoscopy;  Laterality: N/A;  . PORTACATH PLACEMENT Right 10/27/2016   Procedure: INSERTION PORT-A-CATH;  Surgeon: Nestor Lewandowsky, MD;  Location: [facility] ORS;  Service: General;  Laterality: Right;  . TONSILECTOMY, ADENOIDECTOMY, BILATERAL MYRINGOTOMY AND TUBES    . TONSILLECTOMY    . TOTAL MASTECTOMY Left 04/14/2017   Procedure: TOTAL MASTECTOMY;  Surgeon: Olean Ree, MD;  Location: [facility] ORS;  Service: General;  Laterality: Left;    FAMILY HISTORY: Family History  Problem Relation Age of Onset  . Stroke Mother   . Stroke Father   . Cancer Neg Hx   . Depression Neg Hx     ADVANCED DIRECTIVES (Y/N):  N  HEALTH MAINTENANCE: Social History  Substance Use Topics  . Smoking status: Never Smoker  . Smokeless tobacco: Never Used  . Alcohol use No     Colonoscopy:  PAP:  Bone density:  Lipid panel:  Allergies  Allergen Reactions  . Latex Itching  .  Penicillins Itching and Rash    Has patient had a PCN reaction causing immediate rash, facial/tongue/throat swelling, SOB or lightheadedness with hypotension: Yes Has patient had a PCN reaction causing severe rash involving mucus membranes or skin necrosis: No Has patient had  a PCN reaction that required hospitalization No Has patient had a PCN reaction occurring within the last 10 years: No If all of the above answers are "NO", then may proceed with Cephalosporin use.     Current Outpatient Prescriptions  Medication Sig Dispense Refill  . acetaminophen (TYLENOL) 500 MG tablet Take 1,000 mg by mouth every 6 (six) hours as needed for mild pain.    Marland Kitchen aspirin 81 MG tablet Take 81 mg by mouth daily.    Marland Kitchen atorvastatin (LIPITOR) 40 MG tablet Take 40 mg by mouth daily.     . Cholecalciferol (VITAMIN D) 2000 units tablet Take 2,000 Units by mouth 3 (three) times a week. One by mouth twice a week    . ferrous sulfate 325 (65 FE) MG EC tablet Take 325 mg by mouth 3 (three) times a week.     . fluticasone (FLONASE) 50 MCG/ACT nasal spray Place 1 spray into both nostrils daily as needed for allergies or rhinitis.    . Multiple Vitamin (MULTIVITAMIN WITH MINERALS) TABS tablet Take 1 tablet by mouth 3 (three) times a week.     Marland Kitchen oxyCODONE (OXY IR/ROXICODONE) 5 MG immediate release tablet Take 1-2 tablets (5-10 mg total) by mouth every 4 (four) hours as needed for moderate pain. 21 tablet 0  . Polyethyl Glycol-Propyl Glycol (SYSTANE OP) Place 1 drop into both eyes daily as needed (dry eyes).      No current facility-administered medications for this visit.     OBJECTIVE: Vitals:   05/12/17 1020  BP: (!) 149/74  Pulse: 83  Resp: 20  Temp: (!) 97 F (36.1 C)     Body mass index is 29.7 kg/m.    ECOG FS:0 - Asymptomatic  General: Well-developed, well-nourished, no acute distress. Eyes: Pink conjunctiva, anicteric sclera. Breasts: Left mastectomy. Chest wall: Port site without erythema or induration. Lungs: Clear to auscultation bilaterally. Heart: Regular rate and rhythm. No rubs, murmurs, or gallops. Abdomen: Soft, nontender, nondistended. No organomegaly noted, normoactive bowel sounds. Musculoskeletal: No edema, cyanosis, or clubbing. Neuro: Alert, answering all  questions appropriately. Cranial nerves grossly intact. Skin: No rashes or petechiae noted. Psych: Normal affect.  LAB RESULTS:  Lab Results  Component Value Date   NA 143 05/12/2017   K 3.7 05/12/2017   CL 113 (H) 05/12/2017   CO2 22 05/12/2017   GLUCOSE 126 (H) 05/12/2017   BUN 24 (H) 05/12/2017   CREATININE 1.42 (H) 05/12/2017   CALCIUM 9.3 05/12/2017   PROT 6.7 05/12/2017   ALBUMIN 3.4 (L) 05/12/2017   AST 31 05/12/2017   ALT 27 05/12/2017   ALKPHOS 132 (H) 05/12/2017   BILITOT 0.3 05/12/2017   GFRNONAA 35 (L) 05/12/2017   GFRAA 40 (L) 05/12/2017    Lab Results  Component Value Date   WBC 4.6 05/04/2017   NEUTROABS 2,268 05/04/2017   HGB 9.8 (L) 05/04/2017   HCT 30.0 (L) 05/04/2017   MCV 96.8 05/04/2017   PLT 208 05/04/2017     STUDIES: Nm Cardiac Muga Rest  Result Date: 05/05/2017 CLINICAL DATA:  Left breast cancer. EXAM: NUCLEAR MEDICINE CARDIAC BLOOD POOL IMAGING (MUGA) TECHNIQUE: Cardiac multi-gated acquisition was performed at rest following intravenous injection of Tc-30mlabeled red blood cells. RADIOPHARMACEUTICALS:  22.69 mCi Tc-47mMDP in-vitro labeled red blood cells IV COMPARISON:  01/22/2017 FINDINGS: The left ventricular ejection fraction is equal to 54%. This is not significantly changed from the previous exam when this measured 55%. Study was obtained at a heart rate of 76 beats per minute. Cine analysis of the left ventricle demonstrates no focal wall motion abnormalities IMPRESSION: Left ventricular ejection fraction of 54%. Not significantly changed from 55% previously. Normal left ventricular wall motion. Electronically Signed   By: TKerby MoorsM.D.   On: 05/05/2017 14:00   Nm Sentinel Node Injection  Result Date: 04/14/2017 CLINICAL DATA:  Left breast cancer. EXAM: NUCLEAR MEDICINE BREAST LYMPHOSCINTIGRAPHY TECHNIQUE: Intradermal injection of radiopharmaceutical was performed at the 12 o'clock, 3 o'clock, 6 o'clock, and 9 o'clock positions  around the left nipple. The patient was then sent to the operating room where the sentinel node(s) were identified and removed by the surgeon. RADIOPHARMACEUTICALS:  Total of 1 mCi Millipore-filtered Technetium-958mulfur colloid, injected in four aliquots of 0.25 mCi each. IMPRESSION: Uncomplicated intradermal injection of a total of 1 mCi Technetium-9954mlfur colloid for purposes of sentinel node identification. Electronically Signed   By: MarInez CatalinaD.   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  PLAN:    1. 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 She completed neoadjuvant Taxotere, carboplatinum, Herceptin, and Perjeta on February 17, 2017. She had a total mastectomy on April 14, 2017, therefore does not require adjuvant XRT. Continue with maintenance Herceptin every 3 weeks for a total of 18 infusions.  An aromatase inhibitor would not offer benefit given the ER/PR negativity of her disease. Repeat MUGA scan on April 19, 2017 revealed an EF of 54% which is unchanged from previous. Repeat in January 2019. Proceed with cycle 10 of Herceptin today. Return to clinic in 3 weeks for further evaluation and consideration of cycle 11. 2. Anemia: Secondary chemotherapy, monitor. 3. Leukocytosis: Resolved. Secondary to Neulasta. Monitor.  4. Renal insufficiency: Creatinine is only mildly elevated today, monitor. 5. Hypertension: Blood pressure is mildly elevated today. 6. Anxiety: Improved. Continue close follow-up with navigator.  Patient expressed understanding and was in agreement with this plan. She also understands that She can call clinic at any time with any questions, concerns, or complaints.   Cancer Staging Malignant neoplasm  of upper-outer quadrant of left female breast ([facility] form: Breast, AJCC 8th Edition - Clinical stage from 10/11/2016: Stage IIB (cT2, cN1, cM0, G3, ER: Negative, PR: Negative, HER2: Positive) - Signed by FinLloyd HugerD on 10/11/2016 - Pathologic stage from 04/23/2017: No Stage Recommended (ypT0, pN0, cM0, G3, ER: Negative, PR: Negative, HER2: Positive) - Signed by FinLloyd HugerD on 04/23/2017   TimLloyd HugerD   05/12/2017 11:01 AM

## 2017-05-12 ENCOUNTER — Inpatient Hospital Stay: Payer: Medicare Other

## 2017-05-12 ENCOUNTER — Inpatient Hospital Stay (HOSPITAL_BASED_OUTPATIENT_CLINIC_OR_DEPARTMENT_OTHER): Payer: Medicare Other | Admitting: Oncology

## 2017-05-12 VITALS — BP 149/74 | HR 83 | Temp 97.0°F | Resp 20 | Wt 157.2 lb

## 2017-05-12 DIAGNOSIS — K429 Umbilical hernia without obstruction or gangrene: Secondary | ICD-10-CM

## 2017-05-12 DIAGNOSIS — Z171 Estrogen receptor negative status [ER-]: Secondary | ICD-10-CM

## 2017-05-12 DIAGNOSIS — E785 Hyperlipidemia, unspecified: Secondary | ICD-10-CM | POA: Diagnosis not present

## 2017-05-12 DIAGNOSIS — K219 Gastro-esophageal reflux disease without esophagitis: Secondary | ICD-10-CM | POA: Diagnosis not present

## 2017-05-12 DIAGNOSIS — D6481 Anemia due to antineoplastic chemotherapy: Secondary | ICD-10-CM

## 2017-05-12 DIAGNOSIS — C50412 Malignant neoplasm of upper-outer quadrant of left female breast: Secondary | ICD-10-CM | POA: Diagnosis not present

## 2017-05-12 DIAGNOSIS — I700 Atherosclerosis of aorta: Secondary | ICD-10-CM | POA: Diagnosis not present

## 2017-05-12 DIAGNOSIS — Z79899 Other long term (current) drug therapy: Secondary | ICD-10-CM

## 2017-05-12 DIAGNOSIS — N281 Cyst of kidney, acquired: Secondary | ICD-10-CM | POA: Diagnosis not present

## 2017-05-12 DIAGNOSIS — Z7982 Long term (current) use of aspirin: Secondary | ICD-10-CM | POA: Diagnosis not present

## 2017-05-12 DIAGNOSIS — E669 Obesity, unspecified: Secondary | ICD-10-CM

## 2017-05-12 DIAGNOSIS — Z9013 Acquired absence of bilateral breasts and nipples: Secondary | ICD-10-CM | POA: Diagnosis not present

## 2017-05-12 DIAGNOSIS — N189 Chronic kidney disease, unspecified: Secondary | ICD-10-CM | POA: Diagnosis not present

## 2017-05-12 DIAGNOSIS — F419 Anxiety disorder, unspecified: Secondary | ICD-10-CM | POA: Diagnosis not present

## 2017-05-12 DIAGNOSIS — Z5112 Encounter for antineoplastic immunotherapy: Secondary | ICD-10-CM | POA: Diagnosis not present

## 2017-05-12 LAB — COMPREHENSIVE METABOLIC PANEL
ALK PHOS: 132 U/L — AB (ref 38–126)
ALT: 27 U/L (ref 14–54)
ANION GAP: 8 (ref 5–15)
AST: 31 U/L (ref 15–41)
Albumin: 3.4 g/dL — ABNORMAL LOW (ref 3.5–5.0)
BILIRUBIN TOTAL: 0.3 mg/dL (ref 0.3–1.2)
BUN: 24 mg/dL — AB (ref 6–20)
CALCIUM: 9.3 mg/dL (ref 8.9–10.3)
CO2: 22 mmol/L (ref 22–32)
Chloride: 113 mmol/L — ABNORMAL HIGH (ref 101–111)
Creatinine, Ser: 1.42 mg/dL — ABNORMAL HIGH (ref 0.44–1.00)
GFR calc Af Amer: 40 mL/min — ABNORMAL LOW (ref >60)
GFR calc non Af Amer: 35 mL/min — ABNORMAL LOW (ref >60)
GLUCOSE: 126 mg/dL — AB (ref 65–99)
Potassium: 3.7 mmol/L (ref 3.5–5.1)
Sodium: 143 mmol/L (ref 135–145)
TOTAL PROTEIN: 6.7 g/dL (ref 6.5–8.1)

## 2017-05-12 MED ORDER — HEPARIN SOD (PORK) LOCK FLUSH 100 UNIT/ML IV SOLN
500.0000 [IU] | Freq: Once | INTRAVENOUS | Status: AC | PRN
  Administered 2017-05-12: 500 [IU]
  Filled 2017-05-12: qty 5

## 2017-05-12 MED ORDER — SODIUM CHLORIDE 0.9 % IV SOLN
Freq: Once | INTRAVENOUS | Status: AC
  Administered 2017-05-12: 11:00:00 via INTRAVENOUS
  Filled 2017-05-12: qty 1000

## 2017-05-12 MED ORDER — SODIUM CHLORIDE 0.9 % IV SOLN
450.0000 mg | Freq: Once | INTRAVENOUS | Status: AC
  Administered 2017-05-12: 450 mg via INTRAVENOUS
  Filled 2017-05-12: qty 21.43

## 2017-05-12 MED ORDER — ACETAMINOPHEN 325 MG PO TABS
650.0000 mg | ORAL_TABLET | Freq: Once | ORAL | Status: AC
  Administered 2017-05-12: 650 mg via ORAL
  Filled 2017-05-12: qty 2

## 2017-05-12 MED ORDER — DIPHENHYDRAMINE HCL 25 MG PO CAPS
25.0000 mg | ORAL_CAPSULE | Freq: Once | ORAL | Status: AC
  Administered 2017-05-12: 25 mg via ORAL
  Filled 2017-05-12: qty 1

## 2017-05-12 NOTE — Progress Notes (Signed)
Patient denies any concerns today.  

## 2017-05-19 ENCOUNTER — Encounter: Payer: Medicare Other | Admitting: Surgery

## 2017-05-20 ENCOUNTER — Telehealth: Payer: Self-pay

## 2017-05-20 ENCOUNTER — Other Ambulatory Visit: Payer: Self-pay

## 2017-05-20 ENCOUNTER — Encounter: Payer: Self-pay | Admitting: Surgery

## 2017-05-20 ENCOUNTER — Ambulatory Visit (INDEPENDENT_AMBULATORY_CARE_PROVIDER_SITE_OTHER): Payer: Medicare Other | Admitting: Surgery

## 2017-05-20 VITALS — BP 143/82 | HR 81 | Temp 98.2°F | Ht 61.0 in | Wt 160.0 lb

## 2017-05-20 DIAGNOSIS — Z9012 Acquired absence of left breast and nipple: Secondary | ICD-10-CM

## 2017-05-20 DIAGNOSIS — Z09 Encounter for follow-up examination after completed treatment for conditions other than malignant neoplasm: Secondary | ICD-10-CM

## 2017-05-20 NOTE — Patient Instructions (Addendum)
We will send over a referral to Dr. Donella Stade for a radiation consult. If you do not hear from them within the next 48-72 hours please give our office a call.  For the numbness/stiffiness that you are feeling in your shoulder we advise that you keep your shoulder rotating in a circular motion slowly gradually increasing motion daily.  You can apply some vitamin E oil and lotion to your incision to help with the appearance of the skin.  We will follow up with you in 6 months following your Mammogram and ultrasound. We will contact you with these appointment.   We advised that you see your PCP for possible anxiety medication if needed.  It is also okay for you to have a Colonoscopy if you need.   If you have any questions or concerns prior to this visit please give our office a call.  If we don't hear from you then we want to wish you a Happy Holidays!

## 2017-05-20 NOTE — Telephone Encounter (Addendum)
Spoke with [place] and was able to get a mammogram scheduled for 09/24/17 at 11AM.  Call made to patient at this time. Advised her of the follow up mammogram appointment on 09/24/17 at 11AM and her follow up appointment with [place] on 10/04/17 with Dr. Hampton Abbot. Patient verbalized understanding.

## 2017-05-20 NOTE — Progress Notes (Signed)
05/20/2017  HPI: Patient is s/p left mastectomy with left axillary sentinel node biopsy following neoadjuvant therapy for stage IIB left breast cancer, with complete pathologic response following neoadjuvant chemotherapy to pathologic state 0.  She presents for further follow up after surgery.  Since her surgery on 04/14/17, both JP drains have been removed.  She reports improved numbness over the incisions and range of motion of her left shoulder.  Denies any significant pain, though does endorse having some overall anxiety, though cannot pinpoint to a specific trigger.  She continues with her Herceptin therapy and is followed by Dr. Grayland Ormond.  Vital signs: BP (!) 143/82   Pulse 81   Temp 98.2 F (36.8 C) (Oral)   Ht 5\' 1"  (1.549 m)   Wt 72.6 kg (160 lb)   BMI 30.23 kg/m    Physical Exam: Constitutional: No acute distress.  Does not appear anxious. Breast:  S/p left mastectomy.  Mastectomy scar is healing well without evidence of breakdown.  There is one small section over the medial and inferior portion of the incision, measuring about 2 cm diameter of skin that has sloughed, but there is no erythema or evidence of infection and the skin underneath is healing well.  Her axillary incision is healing well and prior JP sites are healing well.  Her range of motion is somewhat limited to below shoulder height.  Assessment/Plan: 76 yo female s/p left mastectomy and sentinel node biopsy for stage IIB left breast cancer.  Discussed with patient that she is healing well and as expected.  The numbness over the incisions should continue to improve as well as the appearance of the scars as the tissue continues to remodel.  She may apply lotions or vitamin E ointment over the scars as needed.  Instructed her on range of motion exercises that she can do to help with her left shoulder, and that she should not keep it immobile.  Also, would like to send a referral to Dr. Baruch Gouty with radiation oncology for  evaluation to check if she would need radiation, possibly to the axilla, given her prior node positivity, though may not be needed given her complete response to neoadjuvant chemo.  Patient will follow up in 6 months from today or 6 months after radiation if needed.   Melvyn Neth, [place]

## 2017-05-30 NOTE — Progress Notes (Signed)
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  INTERVAL HISTORY: Patient returns to clinic today for further evaluation and consideration of cycle 11 of 18 of maintenance Herceptin. She recently underwent mastectomy and was noted to have a complete pathologic response. She currently feels well and is asymptomatic. She continues to be anxious. She has no neurologic complaints. She denies any recent fevers or illnesses. She has a good appetite and denies weight loss. She has no chest pain or shortness of breath. She denies any nausea, vomiting, constipation, or diarrhea. She has no urinary complaints. Patient offers no specific complaints today.  REVIEW OF SYSTEMS:   Review of Systems  Constitutional: Negative.  Negative for fever, malaise/fatigue and weight loss.  Respiratory: Negative.  Negative for cough and shortness of breath.   Cardiovascular: Negative.  Negative for chest pain and leg swelling.  Gastrointestinal: Negative.  Negative for abdominal pain.  Genitourinary: Negative.   Musculoskeletal: Negative.   Skin: Negative.  Negative for rash.  Neurological: Negative.  Negative for sensory change and weakness.  Psychiatric/Behavioral: Negative for depression. The patient is nervous/anxious.     As per HPI. Otherwise, a complete review of systems is negative.  PAST MEDICAL HISTORY: Past Medical History:  Diagnosis Date  . Anemia   . Breast cancer ([place])   . Breast mass, left 09/22/2016   RECOMMENDATION:  Ultrasound-guided core biopsies of masses at left breast 2 o'clock 2 cm from nipple, left breast 2 o'clock 5 cm from nipple, abnormal left axillary lymph nodes.  Marland Kitchen GERD (gastroesophageal reflux disease)   . Hyperglycemia   . Hyperlipidemia   . Obesity   . Postmenopausal     PAST SURGICAL HISTORY: Past Surgical History:  Procedure Laterality Date  . ABDOMINAL HYSTERECTOMY    . BREAST BIOPSY Left 09/30/2016   US biopsy of 3 areas + chemo  . TONSILECTOMY, ADENOIDECTOMY, BILATERAL MYRINGOTOMY AND TUBES    . TONSILLECTOMY      FAMILY HISTORY: Family History  Problem Relation Age of Onset  . Stroke Mother   . Stroke Father   . Cancer Neg Hx   . Depression Neg Hx     ADVANCED DIRECTIVES (Y/N):  N  HEALTH MAINTENANCE: Social History   Tobacco Use  . Smoking status: Never Smoker  . Smokeless tobacco: Never Used  Substance Use Topics  . Alcohol use: No    Alcohol/week: 0.0 oz  . Drug use: No     Colonoscopy:  PAP:  Bone density:  Lipid panel:  Allergies  Allergen Reactions  . Latex Itching  . Penicillins Itching and Rash    Has patient had a PCN reaction causing immediate rash, facial/tongue/throat swelling, SOB or lightheadedness with hypotension: Yes Has patient had a PCN reaction causing severe rash involving mucus membranes or skin necrosis: No Has patient had a PCN reaction that required hospitalization No Has patient had a PCN reaction occurring within the last 10 years: No If all of the above answers are "NO", then may proceed with Cephalosporin use.     Current Outpatient Medications  Medication Sig Dispense Refill  .  acetaminophen (TYLENOL) 500 MG tablet Take 1,000 mg by mouth every 6 (six) hours as needed for mild pain.    Marland Kitchen aspirin 81 MG tablet Take 81 mg by mouth daily.    Marland Kitchen atorvastatin (LIPITOR) 40 MG tablet Take 40 mg by mouth daily.     . Cholecalciferol (VITAMIN D) 2000 units tablet Take 2,000 Units by mouth 3 (three) times a week. One by mouth  twice a week    . ferrous sulfate 325 (65 FE) MG EC tablet Take 325 mg by mouth 3 (three) times a week.     . fluticasone (FLONASE) 50 MCG/ACT nasal spray Place 1 spray into both nostrils daily as needed for allergies or rhinitis.    . Multiple Vitamin (MULTIVITAMIN WITH MINERALS) TABS tablet Take 1 tablet by mouth 3 (three) times a week.     Marland Kitchen oxyCODONE (OXY IR/ROXICODONE) 5 MG immediate release tablet Take 1-2 tablets (5-10 mg total) by mouth every 4 (four) hours as needed for moderate pain. 21 tablet 0  . Polyethyl Glycol-Propyl Glycol (SYSTANE OP) Place 1 drop into both eyes daily as needed (dry eyes).      No current facility-administered medications for this visit.     OBJECTIVE: Vitals:   06/02/17 0920  BP: (!) 150/85  Pulse: 76  Resp: 20  Temp: (!) 96 F (35.6 C)     Body mass index is 30.31 kg/m.    ECOG FS:0 - Asymptomatic  General: Well-developed, well-nourished, no acute distress. Eyes: Pink conjunctiva, anicteric sclera. Breasts: Left mastectomy. Chest wall: Port site without erythema or induration. Lungs: Clear to auscultation bilaterally. Heart: Regular rate and rhythm. No rubs, murmurs, or gallops. Abdomen: Soft, nontender, nondistended. No organomegaly noted, normoactive bowel sounds. Musculoskeletal: No edema, cyanosis, or clubbing. Neuro: Alert, answering all questions appropriately. Cranial nerves grossly intact. Skin: No rashes or petechiae noted. Psych: Normal affect.  LAB RESULTS:  Lab Results  Component Value Date   NA 141 06/02/2017   K 4.2 06/02/2017   CL 110 06/02/2017   CO2 24 06/02/2017   GLUCOSE 90 06/02/2017   BUN 22 (H) 06/02/2017   CREATININE 1.42 (H) 06/02/2017   CALCIUM 9.4 06/02/2017   PROT 7.0 06/02/2017   ALBUMIN 3.7 06/02/2017   AST 528 (H) 06/02/2017   ALT 782 (H) 06/02/2017   ALKPHOS 479 (H) 06/02/2017   BILITOT 0.9 06/02/2017   GFRNONAA 35 (L) 06/02/2017   GFRAA 40 (L) 06/02/2017    Lab Results  Component Value Date    WBC 4.6 05/04/2017   NEUTROABS 2,268 05/04/2017   HGB 9.8 (L) 05/04/2017   HCT 30.0 (L) 05/04/2017   MCV 96.8 05/04/2017   PLT 208 05/04/2017     STUDIES: Nm Cardiac Muga Rest  Result Date: 05/05/2017 CLINICAL DATA:  Left breast cancer. EXAM: NUCLEAR MEDICINE CARDIAC BLOOD POOL IMAGING (MUGA) TECHNIQUE: Cardiac multi-gated acquisition was performed at rest following intravenous injection of Tc-37mlabeled red blood cells. RADIOPHARMACEUTICALS:  22.69 mCi Tc-918mDP in-vitro labeled red blood cells IV COMPARISON:  01/22/2017 FINDINGS: The left ventricular ejection fraction is equal to 54%. This is not significantly changed from the previous exam when this measured 55%. Study was obtained at a heart rate of 76 beats per minute. Cine analysis of the left ventricle demonstrates no focal wall motion abnormalities IMPRESSION: Left ventricular ejection fraction of 54%. Not significantly changed from 55% previously. Normal left ventricular wall motion. Electronically Signed   By: TaKerby Moors.D.   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  PLAN:    1. 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 She completed neoadjuvant Taxotere, carboplatinum, Herceptin, and Perjeta on February 17, 2017. She had a total mastectomy on April 14, 2017, therefore does not require adjuvant XRT. Continue with maintenance Herceptin every 3 weeks for a total of 18 infusions.  An aromatase inhibitor would not offer benefit given the ER/PR negativity of her disease. Repeat MUGA scan on April 19, 2017 revealed an EF of 54% which is unchanged from previous. Repeat in January 2019. Proceed with cycle 11 of Herceptin today. Return to clinic in 3 weeks for consideration  of cycle 12 and then in 6 weeks for further evaluation and consideration of cycle 13. 2. Anemia: Mild, monitor. 3. Leukocytosis: Resolved. Secondary to Neulasta. Monitor.  4. Renal insufficiency: Creatinine is only mildly elevated today, monitor. 5. Hypertension: Blood pressure is mildly elevated today. 6. Anxiety: Improved. Continue close follow-up with navigator. 7.  Elevated LFTs: Unclear etiology.  Patient had normal LFTs 3 weeks ago.  Will get abdominal ultrasound to further evaluate.  Patient expressed understanding and was in agreement with this plan. She also understands that She can call clinic at any time with any questions, concerns, or complaints.   Cancer Staging Malignant neoplasm of upper-outer quadrant of left female breast [facility]) Staging form: Breast, AJCC 8th Edition - Clinical stage from 10/11/2016: Stage IIB (cT2, cN1, cM0, G3, ER: Negative, PR: Negative, HER2: Positive) - Signed by Lloyd Huger, MD on 10/11/2016 - Pathologic stage from 04/23/2017: No Stage Recommended (ypT0, pN0, cM0, G3, ER: Negative, PR: Negative, HER2: Positive) - Signed by Lloyd Huger, MD on 04/23/2017   Lloyd Huger, MD   06/02/2017 1:12 PM

## 2017-06-02 ENCOUNTER — Inpatient Hospital Stay: Payer: Medicare Other | Attending: Oncology | Admitting: Oncology

## 2017-06-02 ENCOUNTER — Inpatient Hospital Stay: Payer: Medicare Other

## 2017-06-02 ENCOUNTER — Other Ambulatory Visit: Payer: Self-pay

## 2017-06-02 VITALS — BP 150/85 | HR 76 | Temp 96.0°F | Resp 20 | Wt 160.4 lb

## 2017-06-02 DIAGNOSIS — F419 Anxiety disorder, unspecified: Secondary | ICD-10-CM

## 2017-06-02 DIAGNOSIS — N289 Disorder of kidney and ureter, unspecified: Secondary | ICD-10-CM

## 2017-06-02 DIAGNOSIS — E785 Hyperlipidemia, unspecified: Secondary | ICD-10-CM | POA: Diagnosis not present

## 2017-06-02 DIAGNOSIS — Z171 Estrogen receptor negative status [ER-]: Secondary | ICD-10-CM

## 2017-06-02 DIAGNOSIS — Z7982 Long term (current) use of aspirin: Secondary | ICD-10-CM | POA: Diagnosis not present

## 2017-06-02 DIAGNOSIS — Z9012 Acquired absence of left breast and nipple: Secondary | ICD-10-CM | POA: Diagnosis not present

## 2017-06-02 DIAGNOSIS — D649 Anemia, unspecified: Secondary | ICD-10-CM

## 2017-06-02 DIAGNOSIS — R7989 Other specified abnormal findings of blood chemistry: Secondary | ICD-10-CM | POA: Diagnosis not present

## 2017-06-02 DIAGNOSIS — I10 Essential (primary) hypertension: Secondary | ICD-10-CM | POA: Diagnosis not present

## 2017-06-02 DIAGNOSIS — Z79899 Other long term (current) drug therapy: Secondary | ICD-10-CM | POA: Diagnosis not present

## 2017-06-02 DIAGNOSIS — C50412 Malignant neoplasm of upper-outer quadrant of left female breast: Secondary | ICD-10-CM

## 2017-06-02 DIAGNOSIS — E669 Obesity, unspecified: Secondary | ICD-10-CM

## 2017-06-02 DIAGNOSIS — K219 Gastro-esophageal reflux disease without esophagitis: Secondary | ICD-10-CM

## 2017-06-02 LAB — COMPREHENSIVE METABOLIC PANEL
ALT: 782 U/L — ABNORMAL HIGH (ref 14–54)
ANION GAP: 7 (ref 5–15)
AST: 528 U/L — ABNORMAL HIGH (ref 15–41)
Albumin: 3.7 g/dL (ref 3.5–5.0)
Alkaline Phosphatase: 479 U/L — ABNORMAL HIGH (ref 38–126)
BUN: 22 mg/dL — ABNORMAL HIGH (ref 6–20)
CHLORIDE: 110 mmol/L (ref 101–111)
CO2: 24 mmol/L (ref 22–32)
Calcium: 9.4 mg/dL (ref 8.9–10.3)
Creatinine, Ser: 1.42 mg/dL — ABNORMAL HIGH (ref 0.44–1.00)
GFR calc non Af Amer: 35 mL/min — ABNORMAL LOW (ref >60)
GFR, EST AFRICAN AMERICAN: 40 mL/min — AB (ref >60)
Glucose, Bld: 90 mg/dL (ref 65–99)
POTASSIUM: 4.2 mmol/L (ref 3.5–5.1)
SODIUM: 141 mmol/L (ref 135–145)
Total Bilirubin: 0.9 mg/dL (ref 0.3–1.2)
Total Protein: 7 g/dL (ref 6.5–8.1)

## 2017-06-02 MED ORDER — DIPHENHYDRAMINE HCL 25 MG PO CAPS
25.0000 mg | ORAL_CAPSULE | Freq: Once | ORAL | Status: AC
  Administered 2017-06-02: 25 mg via ORAL
  Filled 2017-06-02: qty 1

## 2017-06-02 MED ORDER — ACETAMINOPHEN 325 MG PO TABS
650.0000 mg | ORAL_TABLET | Freq: Once | ORAL | Status: AC
  Administered 2017-06-02: 650 mg via ORAL
  Filled 2017-06-02: qty 2

## 2017-06-02 MED ORDER — HEPARIN SOD (PORK) LOCK FLUSH 100 UNIT/ML IV SOLN
500.0000 [IU] | Freq: Once | INTRAVENOUS | Status: AC
  Administered 2017-06-02: 500 [IU] via INTRAVENOUS

## 2017-06-02 MED ORDER — SODIUM CHLORIDE 0.9 % IV SOLN
Freq: Once | INTRAVENOUS | Status: AC
  Administered 2017-06-02: 10:00:00 via INTRAVENOUS
  Filled 2017-06-02: qty 1000

## 2017-06-02 MED ORDER — SODIUM CHLORIDE 0.9 % IV SOLN
450.0000 mg | Freq: Once | INTRAVENOUS | Status: AC
  Administered 2017-06-02: 450 mg via INTRAVENOUS
  Filled 2017-06-02: qty 21.43

## 2017-06-02 MED ORDER — HEPARIN SOD (PORK) LOCK FLUSH 100 UNIT/ML IV SOLN
INTRAVENOUS | Status: AC
  Filled 2017-06-02: qty 5

## 2017-06-02 NOTE — Progress Notes (Signed)
Patient denies any concerns today.  

## 2017-06-03 ENCOUNTER — Encounter: Payer: Self-pay | Admitting: Radiation Oncology

## 2017-06-03 ENCOUNTER — Other Ambulatory Visit: Payer: Self-pay

## 2017-06-03 ENCOUNTER — Ambulatory Visit
Admission: RE | Admit: 2017-06-03 | Discharge: 2017-06-03 | Disposition: A | Discharge status: Home / Self Care | Payer: Medicare Other | Source: Ambulatory Visit | Attending: Radiation Oncology | Admitting: Radiation Oncology

## 2017-06-03 VITALS — BP 142/72 | HR 90 | Temp 97.3°F | Resp 18 | Wt 159.1 lb

## 2017-06-03 DIAGNOSIS — Z9012 Acquired absence of left breast and nipple: Secondary | ICD-10-CM | POA: Insufficient documentation

## 2017-06-03 DIAGNOSIS — Z79899 Other long term (current) drug therapy: Secondary | ICD-10-CM | POA: Diagnosis not present

## 2017-06-03 DIAGNOSIS — E785 Hyperlipidemia, unspecified: Secondary | ICD-10-CM | POA: Diagnosis not present

## 2017-06-03 DIAGNOSIS — Z7982 Long term (current) use of aspirin: Secondary | ICD-10-CM | POA: Diagnosis not present

## 2017-06-03 DIAGNOSIS — C50412 Malignant neoplasm of upper-outer quadrant of left female breast: Secondary | ICD-10-CM | POA: Diagnosis not present

## 2017-06-03 DIAGNOSIS — K219 Gastro-esophageal reflux disease without esophagitis: Secondary | ICD-10-CM | POA: Insufficient documentation

## 2017-06-03 DIAGNOSIS — R739 Hyperglycemia, unspecified: Secondary | ICD-10-CM | POA: Diagnosis not present

## 2017-06-03 DIAGNOSIS — E669 Obesity, unspecified: Secondary | ICD-10-CM | POA: Diagnosis not present

## 2017-06-03 DIAGNOSIS — Z510 Encounter for antineoplastic radiation therapy: Secondary | ICD-10-CM | POA: Insufficient documentation

## 2017-06-03 DIAGNOSIS — Z90710 Acquired absence of both cervix and uterus: Secondary | ICD-10-CM | POA: Diagnosis not present

## 2017-06-03 DIAGNOSIS — Z171 Estrogen receptor negative status [ER-]: Secondary | ICD-10-CM | POA: Insufficient documentation

## 2017-06-03 NOTE — Consult Note (Signed)
NEW PATIENT EVALUATION  Name: Maria Patterson  MRN: [id number]  Date:   06/03/2017     DOB: Jul 22, 1940   This 76 y.o. 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  REFERRING PHYSICIAN: Arnetha Courser, MD  CHIEF COMPLAINT:  Chief Complaint  Patient presents with  . Breast Cancer    Pt is here for initial consultation of breast cancer.     DIAGNOSIS: The encounter diagnosis was Malignant neoplasm of upper-outer quadrant of left breast in female, estrogen receptor negative ([place]).   PREVIOUS INVESTIGATIONS:  Pathology reports reviewed Mammogram and ultrasound and PET/CT scan reviewed Clinical notes reviewed  HPI: Patient is a 76 year old female who presented with self discovered left breast mass. This was confirmed to be a spiculated mass in the upper outer quadrant of the left breast with some abnormal skin thickening in the periareolar region. There is also abnormal enlarged lymph nodes consistent with lymph node involvement. This was confirmed on ultrasound. There actually was to masses in the left breast one at the 2:00 position 5 cm from the nipple measuring 0.5 x 0.6 cm as well as 2:00 position 2 cm from nipple measuring 2.3 x 2.9 cm. Lesions were biopsied in both breasts both positive for invasive mammary carcinoma overall grade 3 ER/PR negative HER-2/neu overexpressed. Core biopsy of her left axillary node was also positive for metastatic disease. PET scan demonstrated hypermetabolic 3 cm mass in the upper outer quadrant of the left breast with hypermetabolic left axillary and subpectoral lymph nodes consistent with metastatic disease. Patient underwent neoadjuvant chemotherapy withTaxotere, carboplatinum,  Herceptin, and Perjeta. She is currently on Herceptin has completed all other chemotherapy. She then underwent a left modified radical mastectomy with 2 sentinel lymph nodes biopsied. Patient had a complete response based on pathologic examination. 2 sentinel lymph nodes were negative. Patient is doing well postoperatively. She specifically denies any chest wall tenderness cough or bone pain. She is now referred to radiation oncology for consideration of adjuvant treatment.    PLANNED TREATMENT REGIMEN: Left chest wall and peripheral lymphatic radiation  PAST MEDICAL HISTORY:  has a past medical history of Anemia, Breast cancer ([place]), Breast mass, left (09/22/2016), GERD (gastroesophageal reflux disease), Hyperglycemia, Hyperlipidemia, Obesity, and Postmenopausal.    PAST SURGICAL HISTORY:  Past Surgical History:  Procedure Laterality Date  . ABDOMINAL HYSTERECTOMY    . AXILLARY SENTINEL NODE BIOPSY Left 04/14/2017   Procedure: AXILLARY SENTINEL NODE BIOPSY;  Surgeon: Olean Ree, MD;  Location: [facility] ORS;  Service: General;  Laterality: Left;  . BREAST BIOPSY Left 09/30/2016   US biopsy of 3 areas + chemo  . ESOPHAGOGASTRODUODENOSCOPY (EGD) WITH PROPOFOL N/A 12/24/2014   Procedure: ESOPHAGOGASTRODUODENOSCOPY (EGD) WITH PROPOFOL;  Surgeon: Manya Silvas, MD;  Location: [facility] ENDOSCOPY;  Service: Endoscopy;  Laterality: N/A;  . PORTACATH PLACEMENT Right 10/27/2016   Procedure: INSERTION PORT-A-CATH;  Surgeon: Nestor Lewandowsky, MD;  Location: [facility] ORS;  Service: General;  Laterality: Right;  . TONSILECTOMY, ADENOIDECTOMY, BILATERAL MYRINGOTOMY AND TUBES    . TONSILLECTOMY    . TOTAL MASTECTOMY Left 04/14/2017   Procedure: TOTAL MASTECTOMY;  Surgeon: Olean Ree, MD;  Location: [facility] ORS;  Service: General;  Laterality: Left;    FAMILY HISTORY: family history includes Stroke in her father and mother.  SOCIAL  HISTORY:  reports that  has never smoked. she has never used smokeless tobacco. She reports  that she does not drink alcohol or use drugs.  ALLERGIES: Latex and Penicillins  MEDICATIONS:  Current Outpatient Medications  Medication Sig Dispense Refill  . acetaminophen (TYLENOL) 500 MG tablet Take 1,000 mg by mouth every 6 (six) hours as needed for mild pain.    Marland Kitchen aspirin 81 MG tablet Take 81 mg by mouth daily.    Marland Kitchen atorvastatin (LIPITOR) 40 MG tablet Take 40 mg by mouth daily.     . Cholecalciferol (VITAMIN D) 2000 units tablet Take 2,000 Units by mouth 3 (three) times a week. One by mouth twice a week    . ferrous sulfate 325 (65 FE) MG EC tablet Take 325 mg by mouth 3 (three) times a week.     . fluticasone (FLONASE) 50 MCG/ACT nasal spray Place 1 spray into both nostrils daily as needed for allergies or rhinitis.    . Multiple Vitamin (MULTIVITAMIN WITH MINERALS) TABS tablet Take 1 tablet by mouth 3 (three) times a week.     Vladimir Faster Glycol-Propyl Glycol (SYSTANE OP) Place 1 drop into both eyes daily as needed (dry eyes).     Marland Kitchen oxyCODONE (OXY IR/ROXICODONE) 5 MG immediate release tablet Take 1-2 tablets (5-10 mg total) by mouth every 4 (four) hours as needed for moderate pain. (Patient not taking: Reported on 06/03/2017) 21 tablet 0   No current facility-administered medications for this encounter.     ECOG PERFORMANCE STATUS:  0 - Asymptomatic  REVIEW OF SYSTEMS:  Patient denies any weight loss, fatigue, weakness, fever, chills or night sweats. Patient denies any loss of vision, blurred vision. Patient denies any ringing  of the ears or hearing loss. No irregular heartbeat. Patient denies heart murmur or history of fainting. Patient denies any chest pain or pain radiating to her upper extremities. Patient denies any shortness of breath, difficulty breathing at night, cough or hemoptysis. Patient denies any swelling in the lower legs. Patient denies any nausea vomiting, vomiting of blood, or coffee ground material in the vomitus. Patient denies any stomach pain. Patient states  has had normal bowel movements no significant constipation or diarrhea. Patient denies any dysuria, hematuria or significant nocturia. Patient denies any problems walking, swelling in the joints or loss of balance. Patient denies any skin changes, loss of hair or loss of weight. Patient denies any excessive worrying or anxiety or significant depression. Patient denies any problems with insomnia. Patient denies excessive thirst, polyuria, polydipsia. Patient denies any swollen glands, patient denies easy bruising or easy bleeding. Patient denies any recent infections, allergies or URI. Patient "s visual fields have not changed significantly in recent time.    PHYSICAL EXAM: BP (!) 142/72   Pulse 90   Temp (!) 97.3 F (36.3 C)   Resp 18   Wt 159 lb 1 oz (72.1 kg)   BMI 30.05 kg/m  Patient is status post left modified radical mastectomy. Skin is well-healed. Right breast is free of dominant mass or nodularity in 2 positions examined no axillary or supraclavicular adenopathy is appreciated. No evidence of lymphedema of her left upper extremity is noted. Well-developed well-nourished patient in NAD. HEENT reveals PERLA, EOMI, discs not visualized.  Oral cavity is clear. No oral mucosal lesions are identified. Neck is clear without evidence of cervical or supraclavicular adenopathy. Lungs are clear to A&P. Cardiac examination is essentially unremarkable with regular rate and rhythm without murmur rub or thrill. Abdomen  is benign with no organomegaly or masses noted. Motor sensory and DTR levels are equal and symmetric in the upper and lower extremities. Cranial nerves II through XII are grossly intact. Proprioception is intact. No peripheral adenopathy or edema is identified. No motor or sensory levels are noted. Crude visual fields are within normal range.  LABORATORY DATA: All pathology reports reviewed    RADIOLOGY RESULTS: Mammogram ultrasound and serial PET/CT scans are reviewed   IMPRESSION:  Locally advanced stage IIB ER/PR negative HER-2/neu overexpressed invasive mammary carcinoma the left breast status post neoadjuvant chemotherapy than left modified radical mastectomy in 76 year old female  PLAN: Based on her poor prognostic features including multifocal disease in her breast, multiple lymph node involvement by PET CT criteria initially as well as subpectoral nodes as well as the high-grade nature of her disease being ER/PR negative HER-2/neu overexpressed I would recommend adjuvant left chest wall and peripheral lymphatic radiation. Would treat up to 5040 cGy in 28 fractions to both areas. Would also boost her scar another 1400 cGy using electron beam targeting the areas of subpectoral lymph node metastasis on her initial PET CT scan. Risks and benefits of treatment including skin reaction fatigue alteration of blood counts possible inclusion of superficial lung and slight possibility of lymphedema of her left upper extremity all were explained in detail to the patient. She seems to comprehend my treatment plan well. Nurse navigator was present during exam and discussion. I've personally set up and ordered CT simulation for early next week.  I would like to take this opportunity to thank you for allowing me to participate in the care of your patient.Armstead Peaks., MD

## 2017-06-07 ENCOUNTER — Ambulatory Visit
Admission: RE | Admit: 2017-06-07 | Discharge: 2017-06-07 | Disposition: A | Discharge status: Home / Self Care | Payer: Medicare Other | Source: Ambulatory Visit | Attending: Radiation Oncology | Admitting: Radiation Oncology

## 2017-06-07 DIAGNOSIS — R739 Hyperglycemia, unspecified: Secondary | ICD-10-CM | POA: Diagnosis not present

## 2017-06-07 DIAGNOSIS — K219 Gastro-esophageal reflux disease without esophagitis: Secondary | ICD-10-CM | POA: Diagnosis not present

## 2017-06-07 DIAGNOSIS — Z79899 Other long term (current) drug therapy: Secondary | ICD-10-CM | POA: Diagnosis not present

## 2017-06-07 DIAGNOSIS — C50412 Malignant neoplasm of upper-outer quadrant of left female breast: Secondary | ICD-10-CM | POA: Diagnosis not present

## 2017-06-07 DIAGNOSIS — Z510 Encounter for antineoplastic radiation therapy: Secondary | ICD-10-CM | POA: Diagnosis not present

## 2017-06-07 DIAGNOSIS — E785 Hyperlipidemia, unspecified: Secondary | ICD-10-CM | POA: Diagnosis not present

## 2017-06-07 DIAGNOSIS — Z7982 Long term (current) use of aspirin: Secondary | ICD-10-CM | POA: Diagnosis not present

## 2017-06-07 DIAGNOSIS — Z171 Estrogen receptor negative status [ER-]: Secondary | ICD-10-CM | POA: Diagnosis not present

## 2017-06-07 DIAGNOSIS — Z9012 Acquired absence of left breast and nipple: Secondary | ICD-10-CM | POA: Diagnosis not present

## 2017-06-09 ENCOUNTER — Ambulatory Visit
Admission: RE | Admit: 2017-06-09 | Discharge: 2017-06-09 | Disposition: A | Discharge status: Home / Self Care | Payer: Medicare Other | Source: Ambulatory Visit | Attending: Oncology | Admitting: Oncology

## 2017-06-09 ENCOUNTER — Other Ambulatory Visit: Payer: Self-pay | Admitting: *Deleted

## 2017-06-09 DIAGNOSIS — Q6102 Congenital multiple renal cysts: Secondary | ICD-10-CM | POA: Diagnosis not present

## 2017-06-09 DIAGNOSIS — Z171 Estrogen receptor negative status [ER-]: Secondary | ICD-10-CM | POA: Insufficient documentation

## 2017-06-09 DIAGNOSIS — C50412 Malignant neoplasm of upper-outer quadrant of left female breast: Secondary | ICD-10-CM

## 2017-06-09 DIAGNOSIS — N281 Cyst of kidney, acquired: Secondary | ICD-10-CM | POA: Diagnosis not present

## 2017-06-14 DIAGNOSIS — Z79899 Other long term (current) drug therapy: Secondary | ICD-10-CM | POA: Diagnosis not present

## 2017-06-14 DIAGNOSIS — K219 Gastro-esophageal reflux disease without esophagitis: Secondary | ICD-10-CM | POA: Diagnosis not present

## 2017-06-14 DIAGNOSIS — Z9012 Acquired absence of left breast and nipple: Secondary | ICD-10-CM | POA: Diagnosis not present

## 2017-06-14 DIAGNOSIS — Z171 Estrogen receptor negative status [ER-]: Secondary | ICD-10-CM | POA: Diagnosis not present

## 2017-06-14 DIAGNOSIS — R739 Hyperglycemia, unspecified: Secondary | ICD-10-CM | POA: Diagnosis not present

## 2017-06-14 DIAGNOSIS — C50412 Malignant neoplasm of upper-outer quadrant of left female breast: Secondary | ICD-10-CM | POA: Diagnosis not present

## 2017-06-14 DIAGNOSIS — Z510 Encounter for antineoplastic radiation therapy: Secondary | ICD-10-CM | POA: Diagnosis not present

## 2017-06-14 DIAGNOSIS — E785 Hyperlipidemia, unspecified: Secondary | ICD-10-CM | POA: Diagnosis not present

## 2017-06-14 DIAGNOSIS — Z7982 Long term (current) use of aspirin: Secondary | ICD-10-CM | POA: Diagnosis not present

## 2017-06-16 ENCOUNTER — Ambulatory Visit
Admission: RE | Admit: 2017-06-16 | Discharge: 2017-06-16 | Disposition: A | Discharge status: Home / Self Care | Payer: Medicare Other | Source: Ambulatory Visit | Attending: Radiation Oncology | Admitting: Radiation Oncology

## 2017-06-16 DIAGNOSIS — Z510 Encounter for antineoplastic radiation therapy: Secondary | ICD-10-CM | POA: Diagnosis not present

## 2017-06-16 DIAGNOSIS — Z7982 Long term (current) use of aspirin: Secondary | ICD-10-CM | POA: Diagnosis not present

## 2017-06-16 DIAGNOSIS — C50412 Malignant neoplasm of upper-outer quadrant of left female breast: Secondary | ICD-10-CM | POA: Diagnosis not present

## 2017-06-16 DIAGNOSIS — K219 Gastro-esophageal reflux disease without esophagitis: Secondary | ICD-10-CM | POA: Diagnosis not present

## 2017-06-16 DIAGNOSIS — Z79899 Other long term (current) drug therapy: Secondary | ICD-10-CM | POA: Diagnosis not present

## 2017-06-16 DIAGNOSIS — E785 Hyperlipidemia, unspecified: Secondary | ICD-10-CM | POA: Diagnosis not present

## 2017-06-16 DIAGNOSIS — Z171 Estrogen receptor negative status [ER-]: Secondary | ICD-10-CM | POA: Diagnosis not present

## 2017-06-16 DIAGNOSIS — R739 Hyperglycemia, unspecified: Secondary | ICD-10-CM | POA: Diagnosis not present

## 2017-06-16 DIAGNOSIS — Z9012 Acquired absence of left breast and nipple: Secondary | ICD-10-CM | POA: Diagnosis not present

## 2017-06-17 ENCOUNTER — Ambulatory Visit
Admission: RE | Admit: 2017-06-17 | Discharge: 2017-06-17 | Disposition: A | Discharge status: Home / Self Care | Payer: Medicare Other | Source: Ambulatory Visit | Attending: Radiation Oncology | Admitting: Radiation Oncology

## 2017-06-17 DIAGNOSIS — Z510 Encounter for antineoplastic radiation therapy: Secondary | ICD-10-CM | POA: Diagnosis not present

## 2017-06-17 DIAGNOSIS — Z79899 Other long term (current) drug therapy: Secondary | ICD-10-CM | POA: Diagnosis not present

## 2017-06-17 DIAGNOSIS — Z171 Estrogen receptor negative status [ER-]: Secondary | ICD-10-CM | POA: Diagnosis not present

## 2017-06-17 DIAGNOSIS — E785 Hyperlipidemia, unspecified: Secondary | ICD-10-CM | POA: Diagnosis not present

## 2017-06-17 DIAGNOSIS — K219 Gastro-esophageal reflux disease without esophagitis: Secondary | ICD-10-CM | POA: Diagnosis not present

## 2017-06-17 DIAGNOSIS — Z7982 Long term (current) use of aspirin: Secondary | ICD-10-CM | POA: Diagnosis not present

## 2017-06-17 DIAGNOSIS — C50412 Malignant neoplasm of upper-outer quadrant of left female breast: Secondary | ICD-10-CM | POA: Diagnosis not present

## 2017-06-17 DIAGNOSIS — Z9012 Acquired absence of left breast and nipple: Secondary | ICD-10-CM | POA: Diagnosis not present

## 2017-06-17 DIAGNOSIS — R739 Hyperglycemia, unspecified: Secondary | ICD-10-CM | POA: Diagnosis not present

## 2017-06-18 ENCOUNTER — Ambulatory Visit
Admission: RE | Admit: 2017-06-18 | Discharge: 2017-06-18 | Disposition: A | Discharge status: Home / Self Care | Payer: Medicare Other | Source: Ambulatory Visit | Attending: Radiation Oncology | Admitting: Radiation Oncology

## 2017-06-18 DIAGNOSIS — Z9012 Acquired absence of left breast and nipple: Secondary | ICD-10-CM | POA: Diagnosis not present

## 2017-06-18 DIAGNOSIS — C50412 Malignant neoplasm of upper-outer quadrant of left female breast: Secondary | ICD-10-CM | POA: Diagnosis not present

## 2017-06-18 DIAGNOSIS — Z171 Estrogen receptor negative status [ER-]: Secondary | ICD-10-CM | POA: Diagnosis not present

## 2017-06-18 DIAGNOSIS — Z79899 Other long term (current) drug therapy: Secondary | ICD-10-CM | POA: Diagnosis not present

## 2017-06-18 DIAGNOSIS — K219 Gastro-esophageal reflux disease without esophagitis: Secondary | ICD-10-CM | POA: Diagnosis not present

## 2017-06-18 DIAGNOSIS — Z510 Encounter for antineoplastic radiation therapy: Secondary | ICD-10-CM | POA: Diagnosis not present

## 2017-06-18 DIAGNOSIS — R739 Hyperglycemia, unspecified: Secondary | ICD-10-CM | POA: Diagnosis not present

## 2017-06-18 DIAGNOSIS — Z7982 Long term (current) use of aspirin: Secondary | ICD-10-CM | POA: Diagnosis not present

## 2017-06-18 DIAGNOSIS — E785 Hyperlipidemia, unspecified: Secondary | ICD-10-CM | POA: Diagnosis not present

## 2017-06-21 ENCOUNTER — Ambulatory Visit
Admission: RE | Admit: 2017-06-21 | Discharge: 2017-06-21 | Disposition: A | Discharge status: Home / Self Care | Payer: Medicare Other | Source: Ambulatory Visit | Attending: Radiation Oncology | Admitting: Radiation Oncology

## 2017-06-21 DIAGNOSIS — K219 Gastro-esophageal reflux disease without esophagitis: Secondary | ICD-10-CM | POA: Diagnosis not present

## 2017-06-21 DIAGNOSIS — C50412 Malignant neoplasm of upper-outer quadrant of left female breast: Secondary | ICD-10-CM | POA: Diagnosis not present

## 2017-06-21 DIAGNOSIS — Z9012 Acquired absence of left breast and nipple: Secondary | ICD-10-CM | POA: Diagnosis not present

## 2017-06-21 DIAGNOSIS — E785 Hyperlipidemia, unspecified: Secondary | ICD-10-CM | POA: Diagnosis not present

## 2017-06-21 DIAGNOSIS — R739 Hyperglycemia, unspecified: Secondary | ICD-10-CM | POA: Diagnosis not present

## 2017-06-21 DIAGNOSIS — Z7982 Long term (current) use of aspirin: Secondary | ICD-10-CM | POA: Diagnosis not present

## 2017-06-21 DIAGNOSIS — Z510 Encounter for antineoplastic radiation therapy: Secondary | ICD-10-CM | POA: Diagnosis not present

## 2017-06-21 DIAGNOSIS — Z171 Estrogen receptor negative status [ER-]: Secondary | ICD-10-CM | POA: Diagnosis not present

## 2017-06-21 DIAGNOSIS — Z79899 Other long term (current) drug therapy: Secondary | ICD-10-CM | POA: Diagnosis not present

## 2017-06-22 ENCOUNTER — Ambulatory Visit
Admission: RE | Admit: 2017-06-22 | Discharge: 2017-06-22 | Disposition: A | Discharge status: Home / Self Care | Payer: Medicare Other | Source: Ambulatory Visit | Attending: Radiation Oncology | Admitting: Radiation Oncology

## 2017-06-22 DIAGNOSIS — R739 Hyperglycemia, unspecified: Secondary | ICD-10-CM | POA: Diagnosis not present

## 2017-06-22 DIAGNOSIS — Z510 Encounter for antineoplastic radiation therapy: Secondary | ICD-10-CM | POA: Diagnosis not present

## 2017-06-22 DIAGNOSIS — Z79899 Other long term (current) drug therapy: Secondary | ICD-10-CM | POA: Diagnosis not present

## 2017-06-22 DIAGNOSIS — Z9012 Acquired absence of left breast and nipple: Secondary | ICD-10-CM | POA: Diagnosis not present

## 2017-06-22 DIAGNOSIS — Z171 Estrogen receptor negative status [ER-]: Secondary | ICD-10-CM | POA: Diagnosis not present

## 2017-06-22 DIAGNOSIS — K219 Gastro-esophageal reflux disease without esophagitis: Secondary | ICD-10-CM | POA: Diagnosis not present

## 2017-06-22 DIAGNOSIS — Z7982 Long term (current) use of aspirin: Secondary | ICD-10-CM | POA: Diagnosis not present

## 2017-06-22 DIAGNOSIS — C50412 Malignant neoplasm of upper-outer quadrant of left female breast: Secondary | ICD-10-CM | POA: Diagnosis not present

## 2017-06-22 DIAGNOSIS — E785 Hyperlipidemia, unspecified: Secondary | ICD-10-CM | POA: Diagnosis not present

## 2017-06-23 ENCOUNTER — Inpatient Hospital Stay: Payer: Medicare Other

## 2017-06-23 ENCOUNTER — Inpatient Hospital Stay: Payer: Medicare Other | Attending: Oncology

## 2017-06-23 ENCOUNTER — Ambulatory Visit
Admission: RE | Admit: 2017-06-23 | Discharge: 2017-06-23 | Disposition: A | Discharge status: Home / Self Care | Payer: Medicare Other | Source: Ambulatory Visit | Attending: Radiation Oncology | Admitting: Radiation Oncology

## 2017-06-23 VITALS — BP 131/76 | HR 65 | Temp 97.3°F | Resp 18

## 2017-06-23 DIAGNOSIS — D649 Anemia, unspecified: Secondary | ICD-10-CM | POA: Diagnosis not present

## 2017-06-23 DIAGNOSIS — K219 Gastro-esophageal reflux disease without esophagitis: Secondary | ICD-10-CM | POA: Diagnosis not present

## 2017-06-23 DIAGNOSIS — E785 Hyperlipidemia, unspecified: Secondary | ICD-10-CM | POA: Diagnosis not present

## 2017-06-23 DIAGNOSIS — Z9012 Acquired absence of left breast and nipple: Secondary | ICD-10-CM | POA: Insufficient documentation

## 2017-06-23 DIAGNOSIS — C50412 Malignant neoplasm of upper-outer quadrant of left female breast: Secondary | ICD-10-CM

## 2017-06-23 DIAGNOSIS — E669 Obesity, unspecified: Secondary | ICD-10-CM | POA: Insufficient documentation

## 2017-06-23 DIAGNOSIS — Z171 Estrogen receptor negative status [ER-]: Principal | ICD-10-CM

## 2017-06-23 DIAGNOSIS — Z5112 Encounter for antineoplastic immunotherapy: Secondary | ICD-10-CM | POA: Diagnosis not present

## 2017-06-23 DIAGNOSIS — R739 Hyperglycemia, unspecified: Secondary | ICD-10-CM | POA: Diagnosis not present

## 2017-06-23 DIAGNOSIS — F419 Anxiety disorder, unspecified: Secondary | ICD-10-CM | POA: Diagnosis not present

## 2017-06-23 DIAGNOSIS — Z510 Encounter for antineoplastic radiation therapy: Secondary | ICD-10-CM | POA: Diagnosis not present

## 2017-06-23 DIAGNOSIS — Z7982 Long term (current) use of aspirin: Secondary | ICD-10-CM | POA: Diagnosis not present

## 2017-06-23 DIAGNOSIS — N2889 Other specified disorders of kidney and ureter: Secondary | ICD-10-CM | POA: Insufficient documentation

## 2017-06-23 DIAGNOSIS — Z79899 Other long term (current) drug therapy: Secondary | ICD-10-CM | POA: Diagnosis not present

## 2017-06-23 LAB — COMPREHENSIVE METABOLIC PANEL
ALBUMIN: 3.6 g/dL (ref 3.5–5.0)
ALT: 39 U/L (ref 14–54)
ANION GAP: 7 (ref 5–15)
AST: 31 U/L (ref 15–41)
Alkaline Phosphatase: 360 U/L — ABNORMAL HIGH (ref 38–126)
BILIRUBIN TOTAL: 0.4 mg/dL (ref 0.3–1.2)
BUN: 26 mg/dL — ABNORMAL HIGH (ref 6–20)
CALCIUM: 9.2 mg/dL (ref 8.9–10.3)
CO2: 22 mmol/L (ref 22–32)
Chloride: 111 mmol/L (ref 101–111)
Creatinine, Ser: 1.29 mg/dL — ABNORMAL HIGH (ref 0.44–1.00)
GFR calc non Af Amer: 39 mL/min — ABNORMAL LOW (ref >60)
GFR, EST AFRICAN AMERICAN: 45 mL/min — AB (ref >60)
GLUCOSE: 87 mg/dL (ref 65–99)
POTASSIUM: 4.2 mmol/L (ref 3.5–5.1)
Sodium: 140 mmol/L (ref 135–145)
TOTAL PROTEIN: 7.1 g/dL (ref 6.5–8.1)

## 2017-06-23 MED ORDER — SODIUM CHLORIDE 0.9 % IV SOLN
450.0000 mg | Freq: Once | INTRAVENOUS | Status: AC
  Administered 2017-06-23: 450 mg via INTRAVENOUS
  Filled 2017-06-23: qty 21.43

## 2017-06-23 MED ORDER — ACETAMINOPHEN 325 MG PO TABS
650.0000 mg | ORAL_TABLET | Freq: Once | ORAL | Status: AC
  Administered 2017-06-23: 650 mg via ORAL
  Filled 2017-06-23: qty 2

## 2017-06-23 MED ORDER — SODIUM CHLORIDE 0.9% FLUSH
10.0000 mL | Freq: Once | INTRAVENOUS | Status: AC
  Administered 2017-06-23: 10 mL via INTRAVENOUS
  Filled 2017-06-23: qty 10

## 2017-06-23 MED ORDER — DIPHENHYDRAMINE HCL 25 MG PO CAPS
25.0000 mg | ORAL_CAPSULE | Freq: Once | ORAL | Status: AC
  Administered 2017-06-23: 25 mg via ORAL
  Filled 2017-06-23: qty 1

## 2017-06-23 MED ORDER — HEPARIN SOD (PORK) LOCK FLUSH 100 UNIT/ML IV SOLN
500.0000 [IU] | Freq: Once | INTRAVENOUS | Status: AC
  Administered 2017-06-23: 500 [IU] via INTRAVENOUS
  Filled 2017-06-23: qty 5

## 2017-06-23 MED ORDER — SODIUM CHLORIDE 0.9 % IV SOLN
Freq: Once | INTRAVENOUS | Status: AC
  Administered 2017-06-23: 10:00:00 via INTRAVENOUS
  Filled 2017-06-23: qty 1000

## 2017-06-24 ENCOUNTER — Ambulatory Visit
Admission: RE | Admit: 2017-06-24 | Discharge: 2017-06-24 | Disposition: A | Discharge status: Home / Self Care | Payer: Medicare Other | Source: Ambulatory Visit | Attending: Radiation Oncology | Admitting: Radiation Oncology

## 2017-06-24 DIAGNOSIS — C50412 Malignant neoplasm of upper-outer quadrant of left female breast: Secondary | ICD-10-CM | POA: Diagnosis not present

## 2017-06-24 DIAGNOSIS — Z171 Estrogen receptor negative status [ER-]: Secondary | ICD-10-CM | POA: Diagnosis not present

## 2017-06-24 DIAGNOSIS — K219 Gastro-esophageal reflux disease without esophagitis: Secondary | ICD-10-CM | POA: Diagnosis not present

## 2017-06-24 DIAGNOSIS — Z510 Encounter for antineoplastic radiation therapy: Secondary | ICD-10-CM | POA: Diagnosis not present

## 2017-06-24 DIAGNOSIS — Z9012 Acquired absence of left breast and nipple: Secondary | ICD-10-CM | POA: Diagnosis not present

## 2017-06-24 DIAGNOSIS — Z7982 Long term (current) use of aspirin: Secondary | ICD-10-CM | POA: Diagnosis not present

## 2017-06-24 DIAGNOSIS — R739 Hyperglycemia, unspecified: Secondary | ICD-10-CM | POA: Diagnosis not present

## 2017-06-24 DIAGNOSIS — E785 Hyperlipidemia, unspecified: Secondary | ICD-10-CM | POA: Diagnosis not present

## 2017-06-24 DIAGNOSIS — Z79899 Other long term (current) drug therapy: Secondary | ICD-10-CM | POA: Diagnosis not present

## 2017-06-25 ENCOUNTER — Ambulatory Visit
Admission: RE | Admit: 2017-06-25 | Discharge: 2017-06-25 | Disposition: A | Discharge status: Home / Self Care | Payer: Medicare Other | Source: Ambulatory Visit | Attending: Radiation Oncology | Admitting: Radiation Oncology

## 2017-06-25 DIAGNOSIS — Z171 Estrogen receptor negative status [ER-]: Secondary | ICD-10-CM | POA: Diagnosis not present

## 2017-06-25 DIAGNOSIS — C50412 Malignant neoplasm of upper-outer quadrant of left female breast: Secondary | ICD-10-CM | POA: Diagnosis not present

## 2017-06-25 DIAGNOSIS — R739 Hyperglycemia, unspecified: Secondary | ICD-10-CM | POA: Diagnosis not present

## 2017-06-25 DIAGNOSIS — Z510 Encounter for antineoplastic radiation therapy: Secondary | ICD-10-CM | POA: Diagnosis not present

## 2017-06-25 DIAGNOSIS — E785 Hyperlipidemia, unspecified: Secondary | ICD-10-CM | POA: Diagnosis not present

## 2017-06-25 DIAGNOSIS — K219 Gastro-esophageal reflux disease without esophagitis: Secondary | ICD-10-CM | POA: Diagnosis not present

## 2017-06-25 DIAGNOSIS — Z79899 Other long term (current) drug therapy: Secondary | ICD-10-CM | POA: Diagnosis not present

## 2017-06-25 DIAGNOSIS — Z9012 Acquired absence of left breast and nipple: Secondary | ICD-10-CM | POA: Diagnosis not present

## 2017-06-25 DIAGNOSIS — Z7982 Long term (current) use of aspirin: Secondary | ICD-10-CM | POA: Diagnosis not present

## 2017-06-28 ENCOUNTER — Ambulatory Visit: Payer: Medicare Other

## 2017-06-29 ENCOUNTER — Ambulatory Visit: Payer: Medicare Other

## 2017-06-30 ENCOUNTER — Ambulatory Visit: Payer: Medicare Other

## 2017-06-30 ENCOUNTER — Inpatient Hospital Stay: Payer: Medicare Other

## 2017-07-01 ENCOUNTER — Ambulatory Visit
Admission: RE | Admit: 2017-07-01 | Discharge: 2017-07-01 | Disposition: A | Discharge status: Home / Self Care | Payer: Medicare Other | Source: Ambulatory Visit | Attending: Radiation Oncology | Admitting: Radiation Oncology

## 2017-07-01 DIAGNOSIS — K219 Gastro-esophageal reflux disease without esophagitis: Secondary | ICD-10-CM | POA: Diagnosis not present

## 2017-07-01 DIAGNOSIS — Z79899 Other long term (current) drug therapy: Secondary | ICD-10-CM | POA: Diagnosis not present

## 2017-07-01 DIAGNOSIS — C50412 Malignant neoplasm of upper-outer quadrant of left female breast: Secondary | ICD-10-CM | POA: Diagnosis not present

## 2017-07-01 DIAGNOSIS — Z7982 Long term (current) use of aspirin: Secondary | ICD-10-CM | POA: Diagnosis not present

## 2017-07-01 DIAGNOSIS — E785 Hyperlipidemia, unspecified: Secondary | ICD-10-CM | POA: Diagnosis not present

## 2017-07-01 DIAGNOSIS — Z171 Estrogen receptor negative status [ER-]: Secondary | ICD-10-CM | POA: Diagnosis not present

## 2017-07-01 DIAGNOSIS — Z9012 Acquired absence of left breast and nipple: Secondary | ICD-10-CM | POA: Diagnosis not present

## 2017-07-01 DIAGNOSIS — R739 Hyperglycemia, unspecified: Secondary | ICD-10-CM | POA: Diagnosis not present

## 2017-07-01 DIAGNOSIS — Z510 Encounter for antineoplastic radiation therapy: Secondary | ICD-10-CM | POA: Diagnosis not present

## 2017-07-02 ENCOUNTER — Ambulatory Visit
Admission: RE | Admit: 2017-07-02 | Discharge: 2017-07-02 | Disposition: A | Discharge status: Home / Self Care | Payer: Medicare Other | Source: Ambulatory Visit | Attending: Radiation Oncology | Admitting: Radiation Oncology

## 2017-07-02 DIAGNOSIS — K219 Gastro-esophageal reflux disease without esophagitis: Secondary | ICD-10-CM | POA: Diagnosis not present

## 2017-07-02 DIAGNOSIS — Z9012 Acquired absence of left breast and nipple: Secondary | ICD-10-CM | POA: Diagnosis not present

## 2017-07-02 DIAGNOSIS — Z79899 Other long term (current) drug therapy: Secondary | ICD-10-CM | POA: Diagnosis not present

## 2017-07-02 DIAGNOSIS — Z510 Encounter for antineoplastic radiation therapy: Secondary | ICD-10-CM | POA: Diagnosis not present

## 2017-07-02 DIAGNOSIS — Z7982 Long term (current) use of aspirin: Secondary | ICD-10-CM | POA: Diagnosis not present

## 2017-07-02 DIAGNOSIS — R739 Hyperglycemia, unspecified: Secondary | ICD-10-CM | POA: Diagnosis not present

## 2017-07-02 DIAGNOSIS — C50412 Malignant neoplasm of upper-outer quadrant of left female breast: Secondary | ICD-10-CM | POA: Diagnosis not present

## 2017-07-02 DIAGNOSIS — Z171 Estrogen receptor negative status [ER-]: Secondary | ICD-10-CM | POA: Diagnosis not present

## 2017-07-02 DIAGNOSIS — E785 Hyperlipidemia, unspecified: Secondary | ICD-10-CM | POA: Diagnosis not present

## 2017-07-05 ENCOUNTER — Other Ambulatory Visit: Payer: Medicare Other

## 2017-07-05 ENCOUNTER — Ambulatory Visit
Admission: RE | Admit: 2017-07-05 | Discharge: 2017-07-05 | Disposition: A | Discharge status: Home / Self Care | Payer: Medicare Other | Source: Ambulatory Visit | Attending: Radiation Oncology | Admitting: Radiation Oncology

## 2017-07-05 DIAGNOSIS — Z7982 Long term (current) use of aspirin: Secondary | ICD-10-CM | POA: Diagnosis not present

## 2017-07-05 DIAGNOSIS — C50412 Malignant neoplasm of upper-outer quadrant of left female breast: Secondary | ICD-10-CM | POA: Diagnosis not present

## 2017-07-05 DIAGNOSIS — R739 Hyperglycemia, unspecified: Secondary | ICD-10-CM | POA: Diagnosis not present

## 2017-07-05 DIAGNOSIS — Z171 Estrogen receptor negative status [ER-]: Secondary | ICD-10-CM | POA: Diagnosis not present

## 2017-07-05 DIAGNOSIS — Z79899 Other long term (current) drug therapy: Secondary | ICD-10-CM | POA: Diagnosis not present

## 2017-07-05 DIAGNOSIS — Z510 Encounter for antineoplastic radiation therapy: Secondary | ICD-10-CM | POA: Diagnosis not present

## 2017-07-05 DIAGNOSIS — E785 Hyperlipidemia, unspecified: Secondary | ICD-10-CM | POA: Diagnosis not present

## 2017-07-05 DIAGNOSIS — Z9012 Acquired absence of left breast and nipple: Secondary | ICD-10-CM | POA: Diagnosis not present

## 2017-07-05 DIAGNOSIS — K219 Gastro-esophageal reflux disease without esophagitis: Secondary | ICD-10-CM | POA: Diagnosis not present

## 2017-07-06 ENCOUNTER — Ambulatory Visit
Admission: RE | Admit: 2017-07-06 | Discharge: 2017-07-06 | Disposition: A | Discharge status: Home / Self Care | Payer: Medicare Other | Source: Ambulatory Visit | Attending: Radiation Oncology | Admitting: Radiation Oncology

## 2017-07-06 DIAGNOSIS — Z79899 Other long term (current) drug therapy: Secondary | ICD-10-CM | POA: Diagnosis not present

## 2017-07-06 DIAGNOSIS — R739 Hyperglycemia, unspecified: Secondary | ICD-10-CM | POA: Diagnosis not present

## 2017-07-06 DIAGNOSIS — Z9012 Acquired absence of left breast and nipple: Secondary | ICD-10-CM | POA: Diagnosis not present

## 2017-07-06 DIAGNOSIS — K219 Gastro-esophageal reflux disease without esophagitis: Secondary | ICD-10-CM | POA: Diagnosis not present

## 2017-07-06 DIAGNOSIS — Z7982 Long term (current) use of aspirin: Secondary | ICD-10-CM | POA: Diagnosis not present

## 2017-07-06 DIAGNOSIS — Z171 Estrogen receptor negative status [ER-]: Secondary | ICD-10-CM | POA: Diagnosis not present

## 2017-07-06 DIAGNOSIS — Z510 Encounter for antineoplastic radiation therapy: Secondary | ICD-10-CM | POA: Diagnosis not present

## 2017-07-06 DIAGNOSIS — E785 Hyperlipidemia, unspecified: Secondary | ICD-10-CM | POA: Diagnosis not present

## 2017-07-06 DIAGNOSIS — C50412 Malignant neoplasm of upper-outer quadrant of left female breast: Secondary | ICD-10-CM | POA: Diagnosis not present

## 2017-07-07 ENCOUNTER — Ambulatory Visit
Admission: RE | Admit: 2017-07-07 | Discharge: 2017-07-07 | Disposition: A | Discharge status: Home / Self Care | Payer: Medicare Other | Source: Ambulatory Visit | Attending: Radiation Oncology | Admitting: Radiation Oncology

## 2017-07-07 DIAGNOSIS — C50412 Malignant neoplasm of upper-outer quadrant of left female breast: Secondary | ICD-10-CM | POA: Diagnosis not present

## 2017-07-07 DIAGNOSIS — E785 Hyperlipidemia, unspecified: Secondary | ICD-10-CM | POA: Diagnosis not present

## 2017-07-07 DIAGNOSIS — R739 Hyperglycemia, unspecified: Secondary | ICD-10-CM | POA: Diagnosis not present

## 2017-07-07 DIAGNOSIS — Z7982 Long term (current) use of aspirin: Secondary | ICD-10-CM | POA: Diagnosis not present

## 2017-07-07 DIAGNOSIS — K219 Gastro-esophageal reflux disease without esophagitis: Secondary | ICD-10-CM | POA: Diagnosis not present

## 2017-07-07 DIAGNOSIS — Z171 Estrogen receptor negative status [ER-]: Secondary | ICD-10-CM | POA: Diagnosis not present

## 2017-07-07 DIAGNOSIS — Z79899 Other long term (current) drug therapy: Secondary | ICD-10-CM | POA: Diagnosis not present

## 2017-07-07 DIAGNOSIS — Z510 Encounter for antineoplastic radiation therapy: Secondary | ICD-10-CM | POA: Diagnosis not present

## 2017-07-07 DIAGNOSIS — Z9012 Acquired absence of left breast and nipple: Secondary | ICD-10-CM | POA: Diagnosis not present

## 2017-07-08 ENCOUNTER — Ambulatory Visit: Payer: Medicare Other

## 2017-07-09 ENCOUNTER — Ambulatory Visit
Admission: RE | Admit: 2017-07-09 | Discharge: 2017-07-09 | Disposition: A | Discharge status: Home / Self Care | Payer: Medicare Other | Source: Ambulatory Visit | Attending: Radiation Oncology | Admitting: Radiation Oncology

## 2017-07-09 DIAGNOSIS — Z9012 Acquired absence of left breast and nipple: Secondary | ICD-10-CM | POA: Diagnosis not present

## 2017-07-09 DIAGNOSIS — Z171 Estrogen receptor negative status [ER-]: Secondary | ICD-10-CM | POA: Diagnosis not present

## 2017-07-09 DIAGNOSIS — R739 Hyperglycemia, unspecified: Secondary | ICD-10-CM | POA: Diagnosis not present

## 2017-07-09 DIAGNOSIS — C50412 Malignant neoplasm of upper-outer quadrant of left female breast: Secondary | ICD-10-CM | POA: Diagnosis not present

## 2017-07-09 DIAGNOSIS — Z510 Encounter for antineoplastic radiation therapy: Secondary | ICD-10-CM | POA: Diagnosis not present

## 2017-07-09 DIAGNOSIS — Z7982 Long term (current) use of aspirin: Secondary | ICD-10-CM | POA: Diagnosis not present

## 2017-07-09 DIAGNOSIS — K219 Gastro-esophageal reflux disease without esophagitis: Secondary | ICD-10-CM | POA: Diagnosis not present

## 2017-07-09 DIAGNOSIS — E785 Hyperlipidemia, unspecified: Secondary | ICD-10-CM | POA: Diagnosis not present

## 2017-07-09 DIAGNOSIS — Z79899 Other long term (current) drug therapy: Secondary | ICD-10-CM | POA: Diagnosis not present

## 2017-07-12 ENCOUNTER — Ambulatory Visit
Admission: RE | Admit: 2017-07-12 | Discharge: 2017-07-12 | Disposition: A | Discharge status: Home / Self Care | Payer: Medicare Other | Source: Ambulatory Visit | Attending: Radiation Oncology | Admitting: Radiation Oncology

## 2017-07-12 DIAGNOSIS — K219 Gastro-esophageal reflux disease without esophagitis: Secondary | ICD-10-CM | POA: Diagnosis not present

## 2017-07-12 DIAGNOSIS — Z79899 Other long term (current) drug therapy: Secondary | ICD-10-CM | POA: Diagnosis not present

## 2017-07-12 DIAGNOSIS — E785 Hyperlipidemia, unspecified: Secondary | ICD-10-CM | POA: Diagnosis not present

## 2017-07-12 DIAGNOSIS — Z171 Estrogen receptor negative status [ER-]: Secondary | ICD-10-CM | POA: Diagnosis not present

## 2017-07-12 DIAGNOSIS — Z9012 Acquired absence of left breast and nipple: Secondary | ICD-10-CM | POA: Diagnosis not present

## 2017-07-12 DIAGNOSIS — R739 Hyperglycemia, unspecified: Secondary | ICD-10-CM | POA: Diagnosis not present

## 2017-07-12 DIAGNOSIS — Z510 Encounter for antineoplastic radiation therapy: Secondary | ICD-10-CM | POA: Diagnosis not present

## 2017-07-12 DIAGNOSIS — Z7982 Long term (current) use of aspirin: Secondary | ICD-10-CM | POA: Diagnosis not present

## 2017-07-12 DIAGNOSIS — C50412 Malignant neoplasm of upper-outer quadrant of left female breast: Secondary | ICD-10-CM | POA: Diagnosis not present

## 2017-07-13 NOTE — Progress Notes (Signed)
[facility]  [phone] Fax:([phone]) [phone]  ID: Maria Patterson OB: [birth year]  MR#: [id number]  EXN#:[id number]  Patient Care Team: Arnetha Courser, MD as PCP - General (Family Medicine) Clayburn Pert, MD as Consulting Physician (General Surgery) Lloyd Huger, MD as Consulting Physician (Oncology) Olean Ree, MD as Consulting Physician (Surgery)  CHIEF COMPLAINT: Pathologic stage 0 ER/PR negative, HER-2 positive invasive carcinoma of the upper outer quadrant of the left breast.  INTERVAL HISTORY: Patient returns to clinic today for further evaluation and consideration of cycle 13 of 18 of maintenance Herceptin. She is tolerating her treatments well without significant side effects. She currently feels well and is asymptomatic. She continues to be anxious. She has no neurologic complaints. She denies any recent fevers or illnesses. She has a good appetite and denies weight loss. She has no chest pain or shortness of breath. She denies any nausea, vomiting, constipation, or diarrhea. She has no urinary complaints. Patient offers no specific complaints today.  REVIEW OF SYSTEMS:   Review of Systems  Constitutional: Negative.  Negative for fever, malaise/fatigue and weight loss.  Respiratory: Negative.  Negative for cough and shortness of breath.   Cardiovascular: Negative.  Negative for chest pain and leg swelling.  Gastrointestinal: Negative.  Negative for abdominal pain.  Genitourinary: Negative.   Musculoskeletal: Negative.   Skin: Negative.  Negative for rash.  Neurological: Negative.  Negative for sensory change and weakness.  Psychiatric/Behavioral: Negative for depression. The patient is nervous/anxious.     As per HPI. Otherwise, a complete review of systems is negative.  PAST MEDICAL HISTORY: Past Medical History:  Diagnosis Date  . Anemia   . Breast cancer ([place])   . Breast mass, left 09/22/2016   RECOMMENDATION: Ultrasound-guided  core biopsies of masses at left breast 2 o'clock 2 cm from nipple, left breast 2 o'clock 5 cm from nipple, abnormal left axillary lymph nodes.  Marland Kitchen GERD (gastroesophageal reflux disease)   . Hyperglycemia   . Hyperlipidemia   . Obesity   . Postmenopausal     PAST SURGICAL HISTORY: Past Surgical History:  Procedure Laterality Date  . ABDOMINAL HYSTERECTOMY    . AXILLARY SENTINEL NODE BIOPSY Left 04/14/2017   Procedure: AXILLARY SENTINEL NODE BIOPSY;  Surgeon: Olean Ree, MD;  Location: [facility] ORS;  Service: General;  Laterality: Left;  . BREAST BIOPSY Left 09/30/2016   US biopsy of 3 areas + chemo  . ESOPHAGOGASTRODUODENOSCOPY (EGD) WITH PROPOFOL N/A 12/24/2014   Procedure: ESOPHAGOGASTRODUODENOSCOPY (EGD) WITH PROPOFOL;  Surgeon: Manya Silvas, MD;  Location: [facility] ENDOSCOPY;  Service: Endoscopy;  Laterality: N/A;  . PORTACATH PLACEMENT Right 10/27/2016   Procedure: INSERTION PORT-A-CATH;  Surgeon: Nestor Lewandowsky, MD;  Location: [facility] ORS;  Service: General;  Laterality: Right;  . TONSILECTOMY, ADENOIDECTOMY, BILATERAL MYRINGOTOMY AND TUBES    . TONSILLECTOMY    . TOTAL MASTECTOMY Left 04/14/2017   Procedure: TOTAL MASTECTOMY;  Surgeon: Olean Ree, MD;  Location: [facility] ORS;  Service: General;  Laterality: Left;    FAMILY HISTORY: Family History  Problem Relation Age of Onset  . Stroke Mother   . Stroke Father   . Cancer Neg Hx   . Depression Neg Hx     ADVANCED DIRECTIVES (Y/N):  N  HEALTH MAINTENANCE: Social History   Tobacco Use  . Smoking status: Never Smoker  . Smokeless tobacco: Never Used  Substance Use Topics  . Alcohol use: No    Alcohol/week: 0.0 oz  . Drug use: No  Colonoscopy:  PAP:  Bone density:  Lipid panel:  Allergies  Allergen Reactions  . Latex Itching  . Penicillins Itching and Rash    Has patient had a PCN reaction causing immediate rash, facial/tongue/throat swelling, SOB or lightheadedness with hypotension: Yes Has patient had a PCN  reaction causing severe rash involving mucus membranes or skin necrosis: No Has patient had a PCN reaction that required hospitalization No Has patient had a PCN reaction occurring within the last 10 years: No If all of the above answers are "NO", then may proceed with Cephalosporin use.     Current Outpatient Medications  Medication Sig Dispense Refill  . acetaminophen (TYLENOL) 500 MG tablet Take 1,000 mg by mouth every 6 (six) hours as needed for mild pain.    Marland Kitchen aspirin 81 MG tablet Take 81 mg by mouth daily.    Marland Kitchen atorvastatin (LIPITOR) 40 MG tablet Take 40 mg by mouth daily.     . Cholecalciferol (VITAMIN D) 2000 units tablet Take 2,000 Units by mouth 3 (three) times a week. One by mouth twice a week    . ferrous sulfate 325 (65 FE) MG EC tablet Take 325 mg by mouth 3 (three) times a week.     . fluticasone (FLONASE) 50 MCG/ACT nasal spray Place 1 spray into both nostrils daily as needed for allergies or rhinitis.    . Multiple Vitamin (MULTIVITAMIN WITH MINERALS) TABS tablet Take 1 tablet by mouth 3 (three) times a week.     Vladimir Faster Glycol-Propyl Glycol (SYSTANE OP) Place 1 drop into both eyes daily as needed (dry eyes).     Marland Kitchen oxyCODONE (OXY IR/ROXICODONE) 5 MG immediate release tablet Take 1-2 tablets (5-10 mg total) by mouth every 4 (four) hours as needed for moderate pain. (Patient not taking: Reported on 06/03/2017) 21 tablet 0   No current facility-administered medications for this visit.    Facility-Administered Medications Ordered in Other Visits  Medication Dose Route Frequency Provider Last Rate Last Dose  . heparin lock flush 100 unit/mL  500 Units Intracatheter Once PRN Lloyd Huger, MD      . sodium chloride flush (NS) 0.9 % injection 10 mL  10 mL Intracatheter PRN Lloyd Huger, MD   10 mL at 07/14/17 0907    OBJECTIVE: Vitals:   07/14/17 0942  BP: 125/76  Pulse: 78  Resp: 20  Temp: 97.6 F (36.4 C)     Body mass index is 30.76 kg/m.    ECOG FS:0  - Asymptomatic  General: Well-developed, well-nourished, no acute distress. Eyes: Pink conjunctiva, anicteric sclera. Breasts: Left mastectomy. Chest wall: Port site without erythema or induration. Lungs: Clear to auscultation bilaterally. Heart: Regular rate and rhythm. No rubs, murmurs, or gallops. Abdomen: Soft, nontender, nondistended. No organomegaly noted, normoactive bowel sounds. Musculoskeletal: No edema, cyanosis, or clubbing. Neuro: Alert, answering all questions appropriately. Cranial nerves grossly intact. Skin: No rashes or petechiae noted. Psych: Normal affect.  LAB RESULTS:  Lab Results  Component Value Date   NA 140 07/14/2017   K 3.9 07/14/2017   CL 112 (H) 07/14/2017   CO2 22 07/14/2017   GLUCOSE 101 (H) 07/14/2017   BUN 27 (H) 07/14/2017   CREATININE 1.29 (H) 07/14/2017   CALCIUM 9.2 07/14/2017   PROT 6.9 07/14/2017   ALBUMIN 3.4 (L) 07/14/2017   AST 26 07/14/2017   ALT 19 07/14/2017   ALKPHOS 188 (H) 07/14/2017   BILITOT 0.5 07/14/2017   GFRNONAA 39 (L) 07/14/2017   GFRAA  45 (L) 07/14/2017    Lab Results  Component Value Date   WBC 4.6 05/04/2017   NEUTROABS 2,268 05/04/2017   HGB 9.8 (L) 05/04/2017   HCT 30.0 (L) 05/04/2017   MCV 96.8 05/04/2017   PLT 208 05/04/2017     STUDIES: No results found.  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  PLAN:    1. 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 She completed neoadjuvant Taxotere, carboplatinum, Herceptin, and Perjeta on February 17, 2017. She had a total mastectomy on April 14, 2017, therefore did not require adjuvant XRT. Continue with maintenance Herceptin every 3 weeks for a total of 18 infusions.  An aromatase inhibitor would not offer benefit given the  ER/PR negativity of her disease. Repeat MUGA scan on April 19, 2017 revealed an EF of 54% which is unchanged from previous. Repeat MUGA prior to next infusion. Proceed with cycle 13 of Herceptin today. Return to clinic in 3 weeks for further evaluation and consideration of cycle 14. 2. Anemia: Mild, monitor. 3. Leukocytosis: Resolved. Secondary to Neulasta. Monitor.  4. Renal insufficiency: Creatinine is only mildly elevated today, monitor. 5. Hypertension: Blood pressure is within normal limits today. 6. Anxiety: Improved. Continue close follow-up with navigator. 7.  Elevated LFTs: Resolved.  Abdominal ultrasound was unrevealing.    Patient expressed understanding and was in agreement with this plan. She also understands that She can call clinic at any time with any questions, concerns, or complaints.   Cancer Staging Malignant neoplasm of upper-outer quadrant of left female breast [facility]) Staging form: Breast, AJCC 8th Edition - Clinical stage from 10/11/2016: Stage IIB (cT2, cN1, cM0, G3, ER: Negative, PR: Negative, HER2: Positive) - Signed by Lloyd Huger, MD on 10/11/2016 - Pathologic stage from 04/23/2017: No Stage Recommended (ypT0, pN0, cM0, G3, ER: Negative, PR: Negative, HER2: Positive) - Signed by Lloyd Huger, MD on 04/23/2017   Lloyd Huger, MD   07/14/2017 11:45 AM

## 2017-07-14 ENCOUNTER — Inpatient Hospital Stay: Payer: Medicare Other

## 2017-07-14 ENCOUNTER — Inpatient Hospital Stay (HOSPITAL_BASED_OUTPATIENT_CLINIC_OR_DEPARTMENT_OTHER): Payer: Medicare Other | Admitting: Oncology

## 2017-07-14 ENCOUNTER — Other Ambulatory Visit: Payer: Self-pay

## 2017-07-14 ENCOUNTER — Ambulatory Visit
Admission: RE | Admit: 2017-07-14 | Discharge: 2017-07-14 | Disposition: A | Discharge status: Home / Self Care | Payer: Medicare Other | Source: Ambulatory Visit | Attending: Radiation Oncology | Admitting: Radiation Oncology

## 2017-07-14 ENCOUNTER — Encounter: Payer: Self-pay | Admitting: Oncology

## 2017-07-14 VITALS — BP 125/76 | HR 78 | Temp 97.6°F | Resp 20 | Wt 162.8 lb

## 2017-07-14 DIAGNOSIS — D649 Anemia, unspecified: Secondary | ICD-10-CM

## 2017-07-14 DIAGNOSIS — F419 Anxiety disorder, unspecified: Secondary | ICD-10-CM

## 2017-07-14 DIAGNOSIS — E785 Hyperlipidemia, unspecified: Secondary | ICD-10-CM

## 2017-07-14 DIAGNOSIS — Z9012 Acquired absence of left breast and nipple: Secondary | ICD-10-CM

## 2017-07-14 DIAGNOSIS — R739 Hyperglycemia, unspecified: Secondary | ICD-10-CM

## 2017-07-14 DIAGNOSIS — Z510 Encounter for antineoplastic radiation therapy: Secondary | ICD-10-CM | POA: Diagnosis not present

## 2017-07-14 DIAGNOSIS — Z5112 Encounter for antineoplastic immunotherapy: Secondary | ICD-10-CM | POA: Diagnosis not present

## 2017-07-14 DIAGNOSIS — Z171 Estrogen receptor negative status [ER-]: Principal | ICD-10-CM

## 2017-07-14 DIAGNOSIS — E669 Obesity, unspecified: Secondary | ICD-10-CM | POA: Diagnosis not present

## 2017-07-14 DIAGNOSIS — K219 Gastro-esophageal reflux disease without esophagitis: Secondary | ICD-10-CM

## 2017-07-14 DIAGNOSIS — Z79899 Other long term (current) drug therapy: Secondary | ICD-10-CM | POA: Diagnosis not present

## 2017-07-14 DIAGNOSIS — N2889 Other specified disorders of kidney and ureter: Secondary | ICD-10-CM | POA: Diagnosis not present

## 2017-07-14 DIAGNOSIS — C50412 Malignant neoplasm of upper-outer quadrant of left female breast: Secondary | ICD-10-CM | POA: Diagnosis not present

## 2017-07-14 DIAGNOSIS — Z7982 Long term (current) use of aspirin: Secondary | ICD-10-CM | POA: Diagnosis not present

## 2017-07-14 DIAGNOSIS — Z5181 Encounter for therapeutic drug level monitoring: Secondary | ICD-10-CM

## 2017-07-14 LAB — COMPREHENSIVE METABOLIC PANEL
ALBUMIN: 3.4 g/dL — AB (ref 3.5–5.0)
ALT: 19 U/L (ref 14–54)
ANION GAP: 6 (ref 5–15)
AST: 26 U/L (ref 15–41)
Alkaline Phosphatase: 188 U/L — ABNORMAL HIGH (ref 38–126)
BUN: 27 mg/dL — ABNORMAL HIGH (ref 6–20)
CHLORIDE: 112 mmol/L — AB (ref 101–111)
CO2: 22 mmol/L (ref 22–32)
Calcium: 9.2 mg/dL (ref 8.9–10.3)
Creatinine, Ser: 1.29 mg/dL — ABNORMAL HIGH (ref 0.44–1.00)
GFR calc non Af Amer: 39 mL/min — ABNORMAL LOW (ref >60)
GFR, EST AFRICAN AMERICAN: 45 mL/min — AB (ref >60)
Glucose, Bld: 101 mg/dL — ABNORMAL HIGH (ref 65–99)
Potassium: 3.9 mmol/L (ref 3.5–5.1)
SODIUM: 140 mmol/L (ref 135–145)
Total Bilirubin: 0.5 mg/dL (ref 0.3–1.2)
Total Protein: 6.9 g/dL (ref 6.5–8.1)

## 2017-07-14 MED ORDER — ACETAMINOPHEN 325 MG PO TABS
650.0000 mg | ORAL_TABLET | Freq: Once | ORAL | Status: AC
  Administered 2017-07-14: 650 mg via ORAL
  Filled 2017-07-14: qty 2

## 2017-07-14 MED ORDER — DIPHENHYDRAMINE HCL 25 MG PO CAPS
25.0000 mg | ORAL_CAPSULE | Freq: Once | ORAL | Status: AC
  Administered 2017-07-14: 25 mg via ORAL
  Filled 2017-07-14: qty 1

## 2017-07-14 MED ORDER — TRASTUZUMAB CHEMO 150 MG IV SOLR
450.0000 mg | Freq: Once | INTRAVENOUS | Status: AC
  Administered 2017-07-14: 450 mg via INTRAVENOUS
  Filled 2017-07-14: qty 21.43

## 2017-07-14 MED ORDER — HEPARIN SOD (PORK) LOCK FLUSH 100 UNIT/ML IV SOLN
500.0000 [IU] | Freq: Once | INTRAVENOUS | Status: AC | PRN
  Administered 2017-07-14: 500 [IU]
  Filled 2017-07-14: qty 5

## 2017-07-14 MED ORDER — SODIUM CHLORIDE 0.9% FLUSH
10.0000 mL | INTRAVENOUS | Status: DC | PRN
  Administered 2017-07-14: 10 mL
  Filled 2017-07-14: qty 10

## 2017-07-14 MED ORDER — SODIUM CHLORIDE 0.9 % IV SOLN
Freq: Once | INTRAVENOUS | Status: AC
  Administered 2017-07-14: 10:00:00 via INTRAVENOUS
  Filled 2017-07-14: qty 1000

## 2017-07-14 NOTE — Progress Notes (Signed)
Patient denies any concerns today.  

## 2017-07-15 ENCOUNTER — Ambulatory Visit: Payer: Medicare Other

## 2017-07-15 ENCOUNTER — Ambulatory Visit
Admission: RE | Admit: 2017-07-15 | Discharge: 2017-07-15 | Disposition: A | Discharge status: Home / Self Care | Payer: Medicare Other | Source: Ambulatory Visit | Attending: Radiation Oncology | Admitting: Radiation Oncology

## 2017-07-15 DIAGNOSIS — Z510 Encounter for antineoplastic radiation therapy: Secondary | ICD-10-CM | POA: Diagnosis not present

## 2017-07-15 DIAGNOSIS — E785 Hyperlipidemia, unspecified: Secondary | ICD-10-CM | POA: Diagnosis not present

## 2017-07-15 DIAGNOSIS — K219 Gastro-esophageal reflux disease without esophagitis: Secondary | ICD-10-CM | POA: Diagnosis not present

## 2017-07-15 DIAGNOSIS — Z171 Estrogen receptor negative status [ER-]: Secondary | ICD-10-CM | POA: Diagnosis not present

## 2017-07-15 DIAGNOSIS — Z9012 Acquired absence of left breast and nipple: Secondary | ICD-10-CM | POA: Diagnosis not present

## 2017-07-15 DIAGNOSIS — R739 Hyperglycemia, unspecified: Secondary | ICD-10-CM | POA: Diagnosis not present

## 2017-07-15 DIAGNOSIS — C50412 Malignant neoplasm of upper-outer quadrant of left female breast: Secondary | ICD-10-CM | POA: Diagnosis not present

## 2017-07-15 DIAGNOSIS — Z79899 Other long term (current) drug therapy: Secondary | ICD-10-CM | POA: Diagnosis not present

## 2017-07-15 DIAGNOSIS — Z7982 Long term (current) use of aspirin: Secondary | ICD-10-CM | POA: Diagnosis not present

## 2017-07-16 ENCOUNTER — Ambulatory Visit
Admission: RE | Admit: 2017-07-16 | Discharge: 2017-07-16 | Disposition: A | Discharge status: Home / Self Care | Payer: Medicare Other | Source: Ambulatory Visit | Attending: Radiation Oncology | Admitting: Radiation Oncology

## 2017-07-16 ENCOUNTER — Ambulatory Visit: Payer: Medicare Other

## 2017-07-16 DIAGNOSIS — Z79899 Other long term (current) drug therapy: Secondary | ICD-10-CM | POA: Diagnosis not present

## 2017-07-16 DIAGNOSIS — E785 Hyperlipidemia, unspecified: Secondary | ICD-10-CM | POA: Diagnosis not present

## 2017-07-16 DIAGNOSIS — Z171 Estrogen receptor negative status [ER-]: Secondary | ICD-10-CM | POA: Diagnosis not present

## 2017-07-16 DIAGNOSIS — K219 Gastro-esophageal reflux disease without esophagitis: Secondary | ICD-10-CM | POA: Diagnosis not present

## 2017-07-16 DIAGNOSIS — Z510 Encounter for antineoplastic radiation therapy: Secondary | ICD-10-CM | POA: Diagnosis not present

## 2017-07-16 DIAGNOSIS — C50412 Malignant neoplasm of upper-outer quadrant of left female breast: Secondary | ICD-10-CM | POA: Diagnosis not present

## 2017-07-16 DIAGNOSIS — R739 Hyperglycemia, unspecified: Secondary | ICD-10-CM | POA: Diagnosis not present

## 2017-07-16 DIAGNOSIS — Z7982 Long term (current) use of aspirin: Secondary | ICD-10-CM | POA: Diagnosis not present

## 2017-07-16 DIAGNOSIS — Z9012 Acquired absence of left breast and nipple: Secondary | ICD-10-CM | POA: Diagnosis not present

## 2017-07-18 ENCOUNTER — Ambulatory Visit: Payer: Medicare Other

## 2017-07-19 ENCOUNTER — Ambulatory Visit
Admission: RE | Admit: 2017-07-19 | Discharge: 2017-07-19 | Disposition: A | Discharge status: Home / Self Care | Payer: Medicare Other | Source: Ambulatory Visit | Attending: Radiation Oncology | Admitting: Radiation Oncology

## 2017-07-19 DIAGNOSIS — C50412 Malignant neoplasm of upper-outer quadrant of left female breast: Secondary | ICD-10-CM | POA: Diagnosis not present

## 2017-07-19 DIAGNOSIS — Z7982 Long term (current) use of aspirin: Secondary | ICD-10-CM | POA: Diagnosis not present

## 2017-07-19 DIAGNOSIS — Z79899 Other long term (current) drug therapy: Secondary | ICD-10-CM | POA: Diagnosis not present

## 2017-07-19 DIAGNOSIS — Z171 Estrogen receptor negative status [ER-]: Secondary | ICD-10-CM | POA: Diagnosis not present

## 2017-07-19 DIAGNOSIS — Z510 Encounter for antineoplastic radiation therapy: Secondary | ICD-10-CM | POA: Diagnosis not present

## 2017-07-19 DIAGNOSIS — E785 Hyperlipidemia, unspecified: Secondary | ICD-10-CM | POA: Diagnosis not present

## 2017-07-19 DIAGNOSIS — Z9012 Acquired absence of left breast and nipple: Secondary | ICD-10-CM | POA: Diagnosis not present

## 2017-07-19 DIAGNOSIS — R739 Hyperglycemia, unspecified: Secondary | ICD-10-CM | POA: Diagnosis not present

## 2017-07-19 DIAGNOSIS — K219 Gastro-esophageal reflux disease without esophagitis: Secondary | ICD-10-CM | POA: Diagnosis not present

## 2017-07-21 ENCOUNTER — Ambulatory Visit
Admission: RE | Admit: 2017-07-21 | Discharge: 2017-07-21 | Disposition: A | Discharge status: Home / Self Care | Payer: Medicare Other | Source: Ambulatory Visit | Attending: Radiation Oncology | Admitting: Radiation Oncology

## 2017-07-21 DIAGNOSIS — Z9012 Acquired absence of left breast and nipple: Secondary | ICD-10-CM | POA: Diagnosis not present

## 2017-07-21 DIAGNOSIS — R739 Hyperglycemia, unspecified: Secondary | ICD-10-CM | POA: Diagnosis not present

## 2017-07-21 DIAGNOSIS — E785 Hyperlipidemia, unspecified: Secondary | ICD-10-CM | POA: Diagnosis not present

## 2017-07-21 DIAGNOSIS — K219 Gastro-esophageal reflux disease without esophagitis: Secondary | ICD-10-CM | POA: Diagnosis not present

## 2017-07-21 DIAGNOSIS — Z79899 Other long term (current) drug therapy: Secondary | ICD-10-CM | POA: Diagnosis not present

## 2017-07-21 DIAGNOSIS — Z510 Encounter for antineoplastic radiation therapy: Secondary | ICD-10-CM | POA: Diagnosis not present

## 2017-07-21 DIAGNOSIS — Z171 Estrogen receptor negative status [ER-]: Secondary | ICD-10-CM | POA: Diagnosis not present

## 2017-07-21 DIAGNOSIS — C50412 Malignant neoplasm of upper-outer quadrant of left female breast: Secondary | ICD-10-CM | POA: Diagnosis not present

## 2017-07-21 DIAGNOSIS — Z7982 Long term (current) use of aspirin: Secondary | ICD-10-CM | POA: Diagnosis not present

## 2017-07-22 ENCOUNTER — Ambulatory Visit
Admission: RE | Admit: 2017-07-22 | Discharge: 2017-07-22 | Disposition: A | Discharge status: Home / Self Care | Payer: Medicare Other | Source: Ambulatory Visit | Attending: Radiation Oncology | Admitting: Radiation Oncology

## 2017-07-22 DIAGNOSIS — R739 Hyperglycemia, unspecified: Secondary | ICD-10-CM | POA: Diagnosis not present

## 2017-07-22 DIAGNOSIS — Z171 Estrogen receptor negative status [ER-]: Secondary | ICD-10-CM | POA: Diagnosis not present

## 2017-07-22 DIAGNOSIS — Z9012 Acquired absence of left breast and nipple: Secondary | ICD-10-CM | POA: Diagnosis not present

## 2017-07-22 DIAGNOSIS — K219 Gastro-esophageal reflux disease without esophagitis: Secondary | ICD-10-CM | POA: Diagnosis not present

## 2017-07-22 DIAGNOSIS — Z7982 Long term (current) use of aspirin: Secondary | ICD-10-CM | POA: Diagnosis not present

## 2017-07-22 DIAGNOSIS — Z510 Encounter for antineoplastic radiation therapy: Secondary | ICD-10-CM | POA: Diagnosis not present

## 2017-07-22 DIAGNOSIS — Z79899 Other long term (current) drug therapy: Secondary | ICD-10-CM | POA: Diagnosis not present

## 2017-07-22 DIAGNOSIS — C50412 Malignant neoplasm of upper-outer quadrant of left female breast: Secondary | ICD-10-CM | POA: Diagnosis not present

## 2017-07-22 DIAGNOSIS — E785 Hyperlipidemia, unspecified: Secondary | ICD-10-CM | POA: Diagnosis not present

## 2017-07-23 ENCOUNTER — Encounter
Admission: RE | Admit: 2017-07-23 | Discharge: 2017-07-23 | Disposition: A | Discharge status: Home / Self Care | Payer: Medicare Other | Source: Ambulatory Visit | Attending: Oncology | Admitting: Oncology

## 2017-07-23 ENCOUNTER — Ambulatory Visit
Admission: RE | Admit: 2017-07-23 | Discharge: 2017-07-23 | Disposition: A | Discharge status: Home / Self Care | Payer: Medicare Other | Source: Ambulatory Visit | Attending: Radiation Oncology | Admitting: Radiation Oncology

## 2017-07-23 DIAGNOSIS — Z9012 Acquired absence of left breast and nipple: Secondary | ICD-10-CM | POA: Diagnosis not present

## 2017-07-23 DIAGNOSIS — K219 Gastro-esophageal reflux disease without esophagitis: Secondary | ICD-10-CM | POA: Diagnosis not present

## 2017-07-23 DIAGNOSIS — Z7982 Long term (current) use of aspirin: Secondary | ICD-10-CM | POA: Diagnosis not present

## 2017-07-23 DIAGNOSIS — C50412 Malignant neoplasm of upper-outer quadrant of left female breast: Secondary | ICD-10-CM | POA: Diagnosis not present

## 2017-07-23 DIAGNOSIS — C50912 Malignant neoplasm of unspecified site of left female breast: Secondary | ICD-10-CM | POA: Diagnosis not present

## 2017-07-23 DIAGNOSIS — Z5111 Encounter for antineoplastic chemotherapy: Secondary | ICD-10-CM | POA: Diagnosis not present

## 2017-07-23 DIAGNOSIS — Z171 Estrogen receptor negative status [ER-]: Secondary | ICD-10-CM | POA: Insufficient documentation

## 2017-07-23 DIAGNOSIS — R739 Hyperglycemia, unspecified: Secondary | ICD-10-CM | POA: Diagnosis not present

## 2017-07-23 DIAGNOSIS — Z510 Encounter for antineoplastic radiation therapy: Secondary | ICD-10-CM | POA: Diagnosis not present

## 2017-07-23 DIAGNOSIS — E785 Hyperlipidemia, unspecified: Secondary | ICD-10-CM | POA: Diagnosis not present

## 2017-07-23 DIAGNOSIS — Z79899 Other long term (current) drug therapy: Secondary | ICD-10-CM | POA: Diagnosis not present

## 2017-07-23 MED ORDER — TECHNETIUM TC 99M-LABELED RED BLOOD CELLS IV KIT
20.0000 | PACK | Freq: Once | INTRAVENOUS | Status: AC | PRN
  Administered 2017-07-23: 22.94 via INTRAVENOUS

## 2017-07-25 ENCOUNTER — Ambulatory Visit: Payer: Medicare Other

## 2017-07-26 ENCOUNTER — Ambulatory Visit
Admission: RE | Admit: 2017-07-26 | Discharge: 2017-07-26 | Disposition: A | Discharge status: Home / Self Care | Payer: Medicare Other | Source: Ambulatory Visit | Attending: Radiation Oncology | Admitting: Radiation Oncology

## 2017-07-26 DIAGNOSIS — R739 Hyperglycemia, unspecified: Secondary | ICD-10-CM | POA: Diagnosis not present

## 2017-07-26 DIAGNOSIS — C50412 Malignant neoplasm of upper-outer quadrant of left female breast: Secondary | ICD-10-CM | POA: Diagnosis not present

## 2017-07-26 DIAGNOSIS — Z171 Estrogen receptor negative status [ER-]: Secondary | ICD-10-CM | POA: Diagnosis not present

## 2017-07-26 DIAGNOSIS — Z9012 Acquired absence of left breast and nipple: Secondary | ICD-10-CM | POA: Diagnosis not present

## 2017-07-26 DIAGNOSIS — Z79899 Other long term (current) drug therapy: Secondary | ICD-10-CM | POA: Diagnosis not present

## 2017-07-26 DIAGNOSIS — Z7982 Long term (current) use of aspirin: Secondary | ICD-10-CM | POA: Diagnosis not present

## 2017-07-26 DIAGNOSIS — K219 Gastro-esophageal reflux disease without esophagitis: Secondary | ICD-10-CM | POA: Diagnosis not present

## 2017-07-26 DIAGNOSIS — E785 Hyperlipidemia, unspecified: Secondary | ICD-10-CM | POA: Diagnosis not present

## 2017-07-26 DIAGNOSIS — Z510 Encounter for antineoplastic radiation therapy: Secondary | ICD-10-CM | POA: Diagnosis not present

## 2017-07-27 ENCOUNTER — Ambulatory Visit
Admission: RE | Admit: 2017-07-27 | Discharge: 2017-07-27 | Disposition: A | Discharge status: Home / Self Care | Payer: Medicare Other | Source: Ambulatory Visit | Attending: Radiation Oncology | Admitting: Radiation Oncology

## 2017-07-27 DIAGNOSIS — Z171 Estrogen receptor negative status [ER-]: Secondary | ICD-10-CM | POA: Diagnosis not present

## 2017-07-27 DIAGNOSIS — Z79899 Other long term (current) drug therapy: Secondary | ICD-10-CM | POA: Diagnosis not present

## 2017-07-27 DIAGNOSIS — Z9012 Acquired absence of left breast and nipple: Secondary | ICD-10-CM | POA: Diagnosis not present

## 2017-07-27 DIAGNOSIS — Z7982 Long term (current) use of aspirin: Secondary | ICD-10-CM | POA: Diagnosis not present

## 2017-07-27 DIAGNOSIS — E785 Hyperlipidemia, unspecified: Secondary | ICD-10-CM | POA: Diagnosis not present

## 2017-07-27 DIAGNOSIS — C50412 Malignant neoplasm of upper-outer quadrant of left female breast: Secondary | ICD-10-CM | POA: Diagnosis not present

## 2017-07-27 DIAGNOSIS — R739 Hyperglycemia, unspecified: Secondary | ICD-10-CM | POA: Diagnosis not present

## 2017-07-27 DIAGNOSIS — K219 Gastro-esophageal reflux disease without esophagitis: Secondary | ICD-10-CM | POA: Diagnosis not present

## 2017-07-27 DIAGNOSIS — Z510 Encounter for antineoplastic radiation therapy: Secondary | ICD-10-CM | POA: Diagnosis not present

## 2017-07-28 ENCOUNTER — Inpatient Hospital Stay: Payer: Medicare Other | Attending: Oncology

## 2017-07-28 ENCOUNTER — Ambulatory Visit
Admission: RE | Admit: 2017-07-28 | Discharge: 2017-07-28 | Disposition: A | Discharge status: Home / Self Care | Payer: Medicare Other | Source: Ambulatory Visit | Attending: Radiation Oncology | Admitting: Radiation Oncology

## 2017-07-28 DIAGNOSIS — R739 Hyperglycemia, unspecified: Secondary | ICD-10-CM | POA: Diagnosis not present

## 2017-07-28 DIAGNOSIS — N289 Disorder of kidney and ureter, unspecified: Secondary | ICD-10-CM | POA: Insufficient documentation

## 2017-07-28 DIAGNOSIS — C50412 Malignant neoplasm of upper-outer quadrant of left female breast: Secondary | ICD-10-CM | POA: Diagnosis not present

## 2017-07-28 DIAGNOSIS — D649 Anemia, unspecified: Secondary | ICD-10-CM | POA: Insufficient documentation

## 2017-07-28 DIAGNOSIS — Z79899 Other long term (current) drug therapy: Secondary | ICD-10-CM | POA: Diagnosis not present

## 2017-07-28 DIAGNOSIS — Z510 Encounter for antineoplastic radiation therapy: Secondary | ICD-10-CM | POA: Diagnosis not present

## 2017-07-28 DIAGNOSIS — Z7982 Long term (current) use of aspirin: Secondary | ICD-10-CM | POA: Diagnosis not present

## 2017-07-28 DIAGNOSIS — E785 Hyperlipidemia, unspecified: Secondary | ICD-10-CM | POA: Diagnosis not present

## 2017-07-28 DIAGNOSIS — Z171 Estrogen receptor negative status [ER-]: Secondary | ICD-10-CM | POA: Insufficient documentation

## 2017-07-28 DIAGNOSIS — K219 Gastro-esophageal reflux disease without esophagitis: Secondary | ICD-10-CM | POA: Diagnosis not present

## 2017-07-28 DIAGNOSIS — Z5112 Encounter for antineoplastic immunotherapy: Secondary | ICD-10-CM | POA: Insufficient documentation

## 2017-07-28 DIAGNOSIS — Z9012 Acquired absence of left breast and nipple: Secondary | ICD-10-CM | POA: Diagnosis not present

## 2017-07-29 ENCOUNTER — Ambulatory Visit
Admission: RE | Admit: 2017-07-29 | Discharge: 2017-07-29 | Disposition: A | Discharge status: Home / Self Care | Payer: Medicare Other | Source: Ambulatory Visit | Attending: Radiation Oncology | Admitting: Radiation Oncology

## 2017-07-29 DIAGNOSIS — Z171 Estrogen receptor negative status [ER-]: Secondary | ICD-10-CM | POA: Diagnosis not present

## 2017-07-29 DIAGNOSIS — Z9012 Acquired absence of left breast and nipple: Secondary | ICD-10-CM | POA: Diagnosis not present

## 2017-07-29 DIAGNOSIS — K219 Gastro-esophageal reflux disease without esophagitis: Secondary | ICD-10-CM | POA: Diagnosis not present

## 2017-07-29 DIAGNOSIS — C50412 Malignant neoplasm of upper-outer quadrant of left female breast: Secondary | ICD-10-CM | POA: Diagnosis not present

## 2017-07-29 DIAGNOSIS — R739 Hyperglycemia, unspecified: Secondary | ICD-10-CM | POA: Diagnosis not present

## 2017-07-29 DIAGNOSIS — Z510 Encounter for antineoplastic radiation therapy: Secondary | ICD-10-CM | POA: Diagnosis not present

## 2017-07-29 DIAGNOSIS — E785 Hyperlipidemia, unspecified: Secondary | ICD-10-CM | POA: Diagnosis not present

## 2017-07-29 DIAGNOSIS — Z79899 Other long term (current) drug therapy: Secondary | ICD-10-CM | POA: Diagnosis not present

## 2017-07-29 DIAGNOSIS — Z7982 Long term (current) use of aspirin: Secondary | ICD-10-CM | POA: Diagnosis not present

## 2017-07-30 ENCOUNTER — Ambulatory Visit
Admission: RE | Admit: 2017-07-30 | Discharge: 2017-07-30 | Disposition: A | Discharge status: Home / Self Care | Payer: Medicare Other | Source: Ambulatory Visit | Attending: Radiation Oncology | Admitting: Radiation Oncology

## 2017-07-30 DIAGNOSIS — Z510 Encounter for antineoplastic radiation therapy: Secondary | ICD-10-CM | POA: Diagnosis not present

## 2017-07-30 DIAGNOSIS — R739 Hyperglycemia, unspecified: Secondary | ICD-10-CM | POA: Diagnosis not present

## 2017-07-30 DIAGNOSIS — Z9012 Acquired absence of left breast and nipple: Secondary | ICD-10-CM | POA: Diagnosis not present

## 2017-07-30 DIAGNOSIS — Z7982 Long term (current) use of aspirin: Secondary | ICD-10-CM | POA: Diagnosis not present

## 2017-07-30 DIAGNOSIS — C50412 Malignant neoplasm of upper-outer quadrant of left female breast: Secondary | ICD-10-CM | POA: Diagnosis not present

## 2017-07-30 DIAGNOSIS — E785 Hyperlipidemia, unspecified: Secondary | ICD-10-CM | POA: Diagnosis not present

## 2017-07-30 DIAGNOSIS — Z171 Estrogen receptor negative status [ER-]: Secondary | ICD-10-CM | POA: Diagnosis not present

## 2017-07-30 DIAGNOSIS — K219 Gastro-esophageal reflux disease without esophagitis: Secondary | ICD-10-CM | POA: Diagnosis not present

## 2017-07-30 DIAGNOSIS — Z79899 Other long term (current) drug therapy: Secondary | ICD-10-CM | POA: Diagnosis not present

## 2017-08-01 NOTE — Progress Notes (Deleted)
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  INTERVAL HISTORY: Patient returns to clinic today for further evaluation and consideration of cycle 13 of 18 of maintenance Herceptin. She is tolerating her treatments well without significant side effects. She currently feels well and is asymptomatic. She continues to be anxious. She has no neurologic complaints. She denies any recent fevers or illnesses. She has a good appetite and denies weight loss. She has no chest pain or shortness of breath. She denies any nausea, vomiting, constipation, or diarrhea. She has no urinary complaints. Patient offers no specific complaints today.  REVIEW OF SYSTEMS:   Review of Systems  Constitutional: Negative.  Negative for fever, malaise/fatigue and weight loss.  Respiratory: Negative.  Negative for cough and shortness of breath.   Cardiovascular: Negative.  Negative for chest pain and leg swelling.  Gastrointestinal: Negative.  Negative for abdominal pain.  Genitourinary: Negative.   Musculoskeletal: Negative.   Skin: Negative.  Negative for rash.  Neurological: Negative.  Negative for sensory change and weakness.  Psychiatric/Behavioral: Negative for depression. The patient is nervous/anxious.     As per HPI. Otherwise, a complete review of systems is negative.  PAST MEDICAL HISTORY: Past Medical History:  Diagnosis Date  . Anemia   . Breast cancer ([place])   . Breast mass, left 09/22/2016   RECOMMENDATION: Ultrasound-guided  core biopsies of masses at left breast 2 o'clock 2 cm from nipple, left breast 2 o'clock 5 cm from nipple, abnormal left axillary lymph nodes.  Marland Kitchen GERD (gastroesophageal reflux disease)   . Hyperglycemia   . Hyperlipidemia   . Obesity   . Postmenopausal     PAST SURGICAL HISTORY: Past Surgical History:  Procedure Laterality Date  . ABDOMINAL HYSTERECTOMY    . AXILLARY SENTINEL NODE BIOPSY Left 04/14/2017   Procedure: AXILLARY SENTINEL NODE BIOPSY;  Surgeon: Olean Ree, MD;  Location: [facility] ORS;  Service: General;  Laterality: Left;  . BREAST BIOPSY Left 09/30/2016   US biopsy of 3 areas + chemo  . ESOPHAGOGASTRODUODENOSCOPY (EGD) WITH PROPOFOL N/A 12/24/2014   Procedure: ESOPHAGOGASTRODUODENOSCOPY (EGD) WITH PROPOFOL;  Surgeon: Manya Silvas, MD;  Location: [facility] ENDOSCOPY;  Service: Endoscopy;  Laterality: N/A;  . PORTACATH PLACEMENT Right 10/27/2016   Procedure: INSERTION PORT-A-CATH;  Surgeon: Nestor Lewandowsky, MD;  Location: [facility] ORS;  Service: General;  Laterality: Right;  . TONSILECTOMY, ADENOIDECTOMY, BILATERAL MYRINGOTOMY AND TUBES    . TONSILLECTOMY    . TOTAL MASTECTOMY Left 04/14/2017   Procedure: TOTAL MASTECTOMY;  Surgeon: Olean Ree, MD;  Location: [facility] ORS;  Service: General;  Laterality: Left;    FAMILY HISTORY: Family History  Problem Relation Age of Onset  . Stroke Mother   . Stroke Father   . Cancer Neg Hx   . Depression Neg Hx     ADVANCED DIRECTIVES (Y/N):  N  HEALTH MAINTENANCE: Social History   Tobacco Use  . Smoking status: Never Smoker  . Smokeless tobacco: Never Used  Substance Use Topics  . Alcohol use: No    Alcohol/week: 0.0 oz  . Drug use: No  Colonoscopy:  PAP:  Bone density:  Lipid panel:  Allergies  Allergen Reactions  . Latex Itching  . Penicillins Itching and Rash    Has patient had a PCN reaction causing immediate rash, facial/tongue/throat swelling, SOB or lightheadedness with hypotension: Yes Has patient had a PCN  reaction causing severe rash involving mucus membranes or skin necrosis: No Has patient had a PCN reaction that required hospitalization No Has patient had a PCN reaction occurring within the last 10 years: No If all of the above answers are "NO", then may proceed with Cephalosporin use.     Current Outpatient Medications  Medication Sig Dispense Refill  . acetaminophen (TYLENOL) 500 MG tablet Take 1,000 mg by mouth every 6 (six) hours as needed for mild pain.    Marland Kitchen aspirin 81 MG tablet Take 81 mg by mouth daily.    Marland Kitchen atorvastatin (LIPITOR) 40 MG tablet Take 40 mg by mouth daily.     . Cholecalciferol (VITAMIN D) 2000 units tablet Take 2,000 Units by mouth 3 (three) times a week. One by mouth twice a week    . ferrous sulfate 325 (65 FE) MG EC tablet Take 325 mg by mouth 3 (three) times a week.     . fluticasone (FLONASE) 50 MCG/ACT nasal spray Place 1 spray into both nostrils daily as needed for allergies or rhinitis.    . Multiple Vitamin (MULTIVITAMIN WITH MINERALS) TABS tablet Take 1 tablet by mouth 3 (three) times a week.     Marland Kitchen oxyCODONE (OXY IR/ROXICODONE) 5 MG immediate release tablet Take 1-2 tablets (5-10 mg total) by mouth every 4 (four) hours as needed for moderate pain. (Patient not taking: Reported on 06/03/2017) 21 tablet 0  . Polyethyl Glycol-Propyl Glycol (SYSTANE OP) Place 1 drop into both eyes daily as needed (dry eyes).      No current facility-administered medications for this visit.     OBJECTIVE: There were no vitals filed for this visit.   There is no height or weight on file to calculate BMI.    ECOG FS:0 - Asymptomatic  General: Well-developed, well-nourished, no acute distress. Eyes: Pink conjunctiva, anicteric sclera. Breasts: Left mastectomy. Chest wall: Port site without erythema or induration. Lungs: Clear to auscultation bilaterally. Heart: Regular rate and rhythm. No rubs, murmurs, or gallops. Abdomen: Soft, nontender, nondistended. No organomegaly noted,  normoactive bowel sounds. Musculoskeletal: No edema, cyanosis, or clubbing. Neuro: Alert, answering all questions appropriately. Cranial nerves grossly intact. Skin: No rashes or petechiae noted. Psych: Normal affect.  LAB RESULTS:  Lab Results  Component Value Date   NA 140 07/14/2017   K 3.9 07/14/2017   CL 112 (H) 07/14/2017   CO2 22 07/14/2017   GLUCOSE 101 (H) 07/14/2017   BUN 27 (H) 07/14/2017   CREATININE 1.29 (H) 07/14/2017   CALCIUM 9.2 07/14/2017   PROT 6.9 07/14/2017   ALBUMIN 3.4 (L) 07/14/2017   AST 26 07/14/2017   ALT 19 07/14/2017   ALKPHOS 188 (H) 07/14/2017   BILITOT 0.5 07/14/2017   GFRNONAA 39 (L) 07/14/2017   GFRAA 45 (L) 07/14/2017    Lab Results  Component Value Date   WBC 4.6 05/04/2017   NEUTROABS 2,268 05/04/2017   HGB 9.8 (L) 05/04/2017   HCT 30.0 (L) 05/04/2017   MCV 96.8 05/04/2017   PLT 208 05/04/2017     STUDIES: Nm Cardiac Muga Rest  Result Date: 07/23/2017 CLINICAL DATA:  LEFT breast cancer, high risk chemotherapy EXAM: NUCLEAR MEDICINE CARDIAC BLOOD POOL IMAGING (MUGA) TECHNIQUE:  Cardiac multi-gated acquisition was performed at rest following intravenous injection of Tc-15mlabeled red blood cells. RADIOPHARMACEUTICALS:  22.94 mCi Tc-922mertechnetate in-vitro labeled autologous red blood cells IV COMPARISON:  05/05/2017 FINDINGS: Calculated LEFT ventricular ejection fraction is 61%, increased from the 54% on the previous exam. Patient was rhythmic during imaging. Study was performed at a cardiac rate of 81 beats per minute. No focal LEFT ventricular wall motion abnormalities. IMPRESSION: Normal LEFT ventricular ejection fraction of 61% increase from the 54% on 05/05/2017. No wall motion abnormalities. Electronically Signed   By: MaLavonia Dana.D.   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  PLAN:    1. 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 She completed neoadjuvant Taxotere, carboplatinum, Herceptin, and Perjeta on February 17, 2017. She had a total mastectomy on April 14, 2017, therefore did not require adjuvant XRT. Continue with maintenance Herceptin every 3 weeks for a total of 18 infusions.  An aromatase inhibitor would not offer benefit given the ER/PR negativity of her disease. Repeat MUGA scan on April 19, 2017 revealed an EF of 54% which is unchanged from previous. Repeat MUGA prior to next infusion. Proceed with cycle 13 of Herceptin today. Return to clinic in 3 weeks for further evaluation and consideration of cycle 14. 2. Anemia: Mild, monitor. 3. Leukocytosis: Resolved. Secondary to Neulasta. Monitor.  4. Renal insufficiency: Creatinine is only mildly elevated today, monitor. 5. Hypertension: Blood pressure is within normal limits today. 6. Anxiety: Improved. Continue close follow-up with navigator. 7.  Elevated LFTs: Resolved.  Abdominal ultrasound was unrevealing.    Patient expressed understanding and was in agreement with this plan. She also understands that She can call clinic at any time with any questions, concerns, or complaints.   Cancer Staging Malignant neoplasm of upper-outer quadrant of left female breast ([facility] IncStaging form: Breast, AJCC 8th Edition - Clinical stage from 10/11/2016: Stage IIB (cT2, cN1, cM0, G3, ER: Negative, PR: Negative, HER2: Positive) - Signed by FiLloyd HugerMD on 10/11/2016 - Pathologic stage from 04/23/2017: No Stage Recommended (ypT0, pN0, cM0, G3, ER: Negative, PR: Negative, HER2: Positive) - Signed by FiLloyd HugerMD on 04/23/2017   TiLloyd HugerMD   08/01/2017 2:26 PM

## 2017-08-02 ENCOUNTER — Ambulatory Visit: Payer: Medicare Other

## 2017-08-03 ENCOUNTER — Other Ambulatory Visit: Payer: Self-pay | Admitting: *Deleted

## 2017-08-03 ENCOUNTER — Ambulatory Visit
Admission: RE | Admit: 2017-08-03 | Discharge: 2017-08-03 | Disposition: A | Discharge status: Home / Self Care | Payer: Medicare Other | Source: Ambulatory Visit | Attending: Radiation Oncology | Admitting: Radiation Oncology

## 2017-08-03 DIAGNOSIS — C50412 Malignant neoplasm of upper-outer quadrant of left female breast: Secondary | ICD-10-CM | POA: Diagnosis not present

## 2017-08-03 DIAGNOSIS — R739 Hyperglycemia, unspecified: Secondary | ICD-10-CM | POA: Diagnosis not present

## 2017-08-03 DIAGNOSIS — E785 Hyperlipidemia, unspecified: Secondary | ICD-10-CM | POA: Diagnosis not present

## 2017-08-03 DIAGNOSIS — Z7982 Long term (current) use of aspirin: Secondary | ICD-10-CM | POA: Diagnosis not present

## 2017-08-03 DIAGNOSIS — Z171 Estrogen receptor negative status [ER-]: Secondary | ICD-10-CM | POA: Diagnosis not present

## 2017-08-03 DIAGNOSIS — K219 Gastro-esophageal reflux disease without esophagitis: Secondary | ICD-10-CM | POA: Diagnosis not present

## 2017-08-03 DIAGNOSIS — Z79899 Other long term (current) drug therapy: Secondary | ICD-10-CM | POA: Diagnosis not present

## 2017-08-03 DIAGNOSIS — Z510 Encounter for antineoplastic radiation therapy: Secondary | ICD-10-CM | POA: Diagnosis not present

## 2017-08-03 DIAGNOSIS — Z9012 Acquired absence of left breast and nipple: Secondary | ICD-10-CM | POA: Diagnosis not present

## 2017-08-03 MED ORDER — SILVER SULFADIAZINE 1 % EX CREA: 1.0000 "application " | TOPICAL_CREAM | Freq: Two times a day (BID) | CUTANEOUS | 3 refills | Status: DC

## 2017-08-04 ENCOUNTER — Inpatient Hospital Stay: Payer: Medicare Other

## 2017-08-04 ENCOUNTER — Inpatient Hospital Stay: Payer: Medicare Other | Admitting: Oncology

## 2017-08-04 ENCOUNTER — Ambulatory Visit
Admission: RE | Admit: 2017-08-04 | Discharge: 2017-08-04 | Disposition: A | Discharge status: Home / Self Care | Payer: Medicare Other | Source: Ambulatory Visit | Attending: Radiation Oncology | Admitting: Radiation Oncology

## 2017-08-04 DIAGNOSIS — Z7982 Long term (current) use of aspirin: Secondary | ICD-10-CM | POA: Diagnosis not present

## 2017-08-04 DIAGNOSIS — Z9012 Acquired absence of left breast and nipple: Secondary | ICD-10-CM | POA: Diagnosis not present

## 2017-08-04 DIAGNOSIS — Z79899 Other long term (current) drug therapy: Secondary | ICD-10-CM | POA: Diagnosis not present

## 2017-08-04 DIAGNOSIS — K219 Gastro-esophageal reflux disease without esophagitis: Secondary | ICD-10-CM | POA: Diagnosis not present

## 2017-08-04 DIAGNOSIS — E785 Hyperlipidemia, unspecified: Secondary | ICD-10-CM | POA: Diagnosis not present

## 2017-08-04 DIAGNOSIS — Z171 Estrogen receptor negative status [ER-]: Secondary | ICD-10-CM | POA: Diagnosis not present

## 2017-08-04 DIAGNOSIS — R739 Hyperglycemia, unspecified: Secondary | ICD-10-CM | POA: Diagnosis not present

## 2017-08-04 DIAGNOSIS — C50412 Malignant neoplasm of upper-outer quadrant of left female breast: Secondary | ICD-10-CM | POA: Diagnosis not present

## 2017-08-04 DIAGNOSIS — Z510 Encounter for antineoplastic radiation therapy: Secondary | ICD-10-CM | POA: Diagnosis not present

## 2017-08-05 ENCOUNTER — Ambulatory Visit
Admission: RE | Admit: 2017-08-05 | Discharge: 2017-08-05 | Disposition: A | Discharge status: Home / Self Care | Payer: Medicare Other | Source: Ambulatory Visit | Attending: Radiation Oncology | Admitting: Radiation Oncology

## 2017-08-05 DIAGNOSIS — Z7982 Long term (current) use of aspirin: Secondary | ICD-10-CM | POA: Diagnosis not present

## 2017-08-05 DIAGNOSIS — K219 Gastro-esophageal reflux disease without esophagitis: Secondary | ICD-10-CM | POA: Diagnosis not present

## 2017-08-05 DIAGNOSIS — Z9012 Acquired absence of left breast and nipple: Secondary | ICD-10-CM | POA: Diagnosis not present

## 2017-08-05 DIAGNOSIS — Z79899 Other long term (current) drug therapy: Secondary | ICD-10-CM | POA: Diagnosis not present

## 2017-08-05 DIAGNOSIS — R739 Hyperglycemia, unspecified: Secondary | ICD-10-CM | POA: Diagnosis not present

## 2017-08-05 DIAGNOSIS — Z510 Encounter for antineoplastic radiation therapy: Secondary | ICD-10-CM | POA: Diagnosis not present

## 2017-08-05 DIAGNOSIS — E785 Hyperlipidemia, unspecified: Secondary | ICD-10-CM | POA: Diagnosis not present

## 2017-08-05 DIAGNOSIS — Z171 Estrogen receptor negative status [ER-]: Secondary | ICD-10-CM | POA: Diagnosis not present

## 2017-08-05 DIAGNOSIS — C50412 Malignant neoplasm of upper-outer quadrant of left female breast: Secondary | ICD-10-CM | POA: Diagnosis not present

## 2017-08-06 ENCOUNTER — Ambulatory Visit: Payer: Medicare Other

## 2017-08-06 ENCOUNTER — Ambulatory Visit
Admission: RE | Admit: 2017-08-06 | Discharge: 2017-08-06 | Disposition: A | Discharge status: Home / Self Care | Payer: Medicare Other | Source: Ambulatory Visit | Attending: Radiation Oncology | Admitting: Radiation Oncology

## 2017-08-06 DIAGNOSIS — Z510 Encounter for antineoplastic radiation therapy: Secondary | ICD-10-CM | POA: Diagnosis not present

## 2017-08-06 DIAGNOSIS — K219 Gastro-esophageal reflux disease without esophagitis: Secondary | ICD-10-CM | POA: Diagnosis not present

## 2017-08-06 DIAGNOSIS — E785 Hyperlipidemia, unspecified: Secondary | ICD-10-CM | POA: Diagnosis not present

## 2017-08-06 DIAGNOSIS — Z171 Estrogen receptor negative status [ER-]: Secondary | ICD-10-CM | POA: Diagnosis not present

## 2017-08-06 DIAGNOSIS — Z7982 Long term (current) use of aspirin: Secondary | ICD-10-CM | POA: Diagnosis not present

## 2017-08-06 DIAGNOSIS — C50412 Malignant neoplasm of upper-outer quadrant of left female breast: Secondary | ICD-10-CM | POA: Diagnosis not present

## 2017-08-06 DIAGNOSIS — Z9012 Acquired absence of left breast and nipple: Secondary | ICD-10-CM | POA: Diagnosis not present

## 2017-08-06 DIAGNOSIS — R739 Hyperglycemia, unspecified: Secondary | ICD-10-CM | POA: Diagnosis not present

## 2017-08-06 DIAGNOSIS — Z79899 Other long term (current) drug therapy: Secondary | ICD-10-CM | POA: Diagnosis not present

## 2017-08-09 ENCOUNTER — Ambulatory Visit
Admission: RE | Admit: 2017-08-09 | Discharge: 2017-08-09 | Disposition: A | Discharge status: Home / Self Care | Payer: Medicare Other | Source: Ambulatory Visit | Attending: Radiation Oncology | Admitting: Radiation Oncology

## 2017-08-09 ENCOUNTER — Ambulatory Visit: Payer: Medicare Other

## 2017-08-09 ENCOUNTER — Ambulatory Visit (INDEPENDENT_AMBULATORY_CARE_PROVIDER_SITE_OTHER): Payer: Medicare Other | Admitting: Family Medicine

## 2017-08-09 ENCOUNTER — Encounter: Payer: Self-pay | Admitting: Family Medicine

## 2017-08-09 VITALS — BP 152/80 | HR 96 | Temp 97.8°F | Wt 162.9 lb

## 2017-08-09 DIAGNOSIS — N1832 Chronic kidney disease, stage 3b: Secondary | ICD-10-CM | POA: Insufficient documentation

## 2017-08-09 DIAGNOSIS — Z9012 Acquired absence of left breast and nipple: Secondary | ICD-10-CM | POA: Diagnosis not present

## 2017-08-09 DIAGNOSIS — E785 Hyperlipidemia, unspecified: Secondary | ICD-10-CM

## 2017-08-09 DIAGNOSIS — Z79899 Other long term (current) drug therapy: Secondary | ICD-10-CM | POA: Diagnosis not present

## 2017-08-09 DIAGNOSIS — Z5181 Encounter for therapeutic drug level monitoring: Secondary | ICD-10-CM | POA: Diagnosis not present

## 2017-08-09 DIAGNOSIS — R8281 Pyuria: Secondary | ICD-10-CM

## 2017-08-09 DIAGNOSIS — N183 Chronic kidney disease, stage 3 unspecified: Secondary | ICD-10-CM

## 2017-08-09 DIAGNOSIS — R1030 Lower abdominal pain, unspecified: Secondary | ICD-10-CM

## 2017-08-09 DIAGNOSIS — R102 Pelvic and perineal pain unspecified side: Secondary | ICD-10-CM

## 2017-08-09 DIAGNOSIS — C50412 Malignant neoplasm of upper-outer quadrant of left female breast: Secondary | ICD-10-CM

## 2017-08-09 DIAGNOSIS — Z7982 Long term (current) use of aspirin: Secondary | ICD-10-CM | POA: Diagnosis not present

## 2017-08-09 DIAGNOSIS — N390 Urinary tract infection, site not specified: Secondary | ICD-10-CM

## 2017-08-09 DIAGNOSIS — Z510 Encounter for antineoplastic radiation therapy: Secondary | ICD-10-CM | POA: Diagnosis not present

## 2017-08-09 DIAGNOSIS — D649 Anemia, unspecified: Secondary | ICD-10-CM

## 2017-08-09 DIAGNOSIS — Z171 Estrogen receptor negative status [ER-]: Secondary | ICD-10-CM | POA: Diagnosis not present

## 2017-08-09 DIAGNOSIS — R739 Hyperglycemia, unspecified: Secondary | ICD-10-CM | POA: Diagnosis not present

## 2017-08-09 DIAGNOSIS — K219 Gastro-esophageal reflux disease without esophagitis: Secondary | ICD-10-CM | POA: Diagnosis not present

## 2017-08-09 LAB — POCT URINALYSIS DIPSTICK
Bilirubin, UA: NEGATIVE
Blood, UA: NEGATIVE
GLUCOSE UA: NEGATIVE
Ketones, UA: NEGATIVE
NITRITE UA: NEGATIVE
Spec Grav, UA: 1.02 (ref 1.010–1.025)
Urobilinogen, UA: 0.2 E.U./dL
pH, UA: 5 (ref 5.0–8.0)

## 2017-08-09 NOTE — Progress Notes (Signed)
BP (!) 152/80 (BP Location: Right Arm, Patient Position: Sitting, Cuff Size: Normal)   Pulse 96   Temp 97.8 F (36.6 C) (Oral)   Wt 162 lb 14.4 oz (73.9 kg)   SpO2 96%   BMI 30.78 kg/m    Subjective:    Patient ID: Maria Patterson, female    DOB: 1941/02/10, 77 y.o.   MRN: [id number]  HPI: Maria Patterson is a 77 y.o. female  Chief Complaint  Patient presents with  . Lower Abdominal Pain    Pt states that pain comes and goes, when she has the pain it is a 3/10    HPI She says that she has sharp pain going up the right side in the groin and up into the lower abdomen Was going on in the summer Pain is sharp; she was at church and when she stood up and felt it; lasted very briefly, all at once and then went away Some times like little boils on her "lips", but none now; she declined, "no point in lookin'" because it's been almost a year ago Kidney function had improved from October to December She does not see a kidney doctor She had liver trouble in November  She had anemia a while back; she is seeing heme-onc Last hemoglobin was 9.8, up from 8.3 She is getting radiation treatment, five days a week; she has about a month or two left Weight stable; no fevers Some burning with urination Gets some bloating when eating a lot Something is just not right  BP was up a little; wonders if the treatments are doing it  Depression screen [facility] 2/9 08/09/2017 06/03/2017 05/04/2017 02/01/2017 11/06/2016  Decreased Interest 0 0 0 0 0  Down, Depressed, Hopeless 0 0 0 1 0  PHQ - 2 Score 0 0 0 1 0   Relevant past medical, surgical, family and social history reviewed Past Medical History:  Diagnosis Date  . Anemia   . Breast cancer ([place])   . Breast mass, left 09/22/2016   RECOMMENDATION: Ultrasound-guided core biopsies of masses at left breast 2 o'clock 2 cm from nipple, left breast 2 o'clock 5 cm from nipple, abnormal left axillary lymph nodes.  Marland Kitchen GERD (gastroesophageal reflux disease)   .  Hyperglycemia   . Hyperlipidemia   . Obesity   . Postmenopausal    Past Surgical History:  Procedure Laterality Date  . ABDOMINAL HYSTERECTOMY    . AXILLARY SENTINEL NODE BIOPSY Left 04/14/2017   Procedure: AXILLARY SENTINEL NODE BIOPSY;  Surgeon: Olean Ree, MD;  Location: [facility] ORS;  Service: General;  Laterality: Left;  . BREAST BIOPSY Left 09/30/2016   US biopsy of 3 areas + chemo  . ESOPHAGOGASTRODUODENOSCOPY (EGD) WITH PROPOFOL N/A 12/24/2014   Procedure: ESOPHAGOGASTRODUODENOSCOPY (EGD) WITH PROPOFOL;  Surgeon: Manya Silvas, MD;  Location[facility] ENDOSCOPY;  Service: Endoscopy;  Laterality: N/A;  . PORTACATH PLACEMENT Right 10/27/2016   Procedure: INSERTION PORT-A-CATH;  Surgeon: Nestor Lewandowsky, MD;  Location: [facility] ORS;  Service: General;  Laterality: Right;  . TONSILECTOMY, ADENOIDECTOMY, BILATERAL MYRINGOTOMY AND TUBES    . TONSILLECTOMY    . TOTAL MASTECTOMY Left 04/14/2017   Procedure: TOTAL MASTECTOMY;  Surgeon: Olean Ree, MD;  Location: [facility] ORS;  Service: General;  Laterality: Left;   Family History  Problem Relation Age of Onset  . Stroke Mother   . Stroke Father   . Cancer Neg Hx   . Depression Neg Hx    Social History   Tobacco Use  .  Smoking status: Never Smoker  . Smokeless tobacco: Never Used  Substance Use Topics  . Alcohol use: No    Alcohol/week: 0.0 oz  . Drug use: No    Interim medical history since last visit reviewed. Allergies and medications reviewed  Review of Systems Per HPI unless specifically indicated above     Objective:    BP (!) 152/80 (BP Location: Right Arm, Patient Position: Sitting, Cuff Size: Normal)   Pulse 96   Temp 97.8 F (36.6 C) (Oral)   Wt 162 lb 14.4 oz (73.9 kg)   SpO2 96%   BMI 30.78 kg/m   Wt Readings from Last 3 Encounters:  08/09/17 162 lb 14.4 oz (73.9 kg)  07/14/17 162 lb 12.8 oz (73.8 kg)  06/03/17 159 lb 1 oz (72.1 kg)    Physical Exam  Constitutional: She appears well-developed and  well-nourished. No distress.  HENT:  Head: Normocephalic and atraumatic.  Eyes: EOM are normal. No scleral icterus.  Neck: No thyromegaly present.  Cardiovascular: Normal rate, regular rhythm and normal heart sounds.  No murmur heard. Pulmonary/Chest: Effort normal and breath sounds normal. No respiratory distress. She has no wheezes.  Abdominal: Soft. Bowel sounds are normal. She exhibits no distension and no mass. There is tenderness (RLQ > LLQ) in the right lower quadrant and left lower quadrant. There is no guarding.  Musculoskeletal: Normal range of motion. She exhibits no edema.  Neurological: She is alert. She exhibits normal muscle tone.  Skin: Skin is warm and dry. She is not diaphoretic. No pallor.  Psychiatric: She has a normal mood and affect. Her behavior is normal. Judgment and thought content normal. Her mood appears not anxious. She does not exhibit a depressed mood.    Results for orders placed or performed in visit on 08/09/17  POCT urinalysis dipstick  Result Value Ref Range   Color, UA yellow    Clarity, UA clear    Glucose, UA neg    Bilirubin, UA neg    Ketones, UA neg    Spec Grav, UA 1.020 1.010 - 1.025   Blood, UA neg    pH, UA 5.0 5.0 - 8.0   Protein, UA trace    Urobilinogen, UA 0.2 0.2 or 1.0 E.U./dL   Nitrite, UA neg    Leukocytes, UA Trace (A) Negative   Appearance clear    Odor none       Assessment & Plan:   Problem List Items Addressed This Visit      Genitourinary   Chronic renal disease, stage III (HCC)    Avoid NSAIDs; stay well-hydrated        Other   Anemia   Relevant Orders   CBC with Differential/Platelet   Malignant neoplasm of upper-outer quadrant of left female breast ([place])    Undergoing active treatment for her breast cancer with radiation treatment      Relevant Orders   US Pelvis Complete   US Transvaginal Non-OB    Other Visit Diagnoses    Lower abdominal pain    -  Primary   Relevant Orders   POCT urinalysis  dipstick (Completed)   US Pelvis Complete   US Transvaginal Non-OB   Urine Culture   Pelvic pain       Relevant Orders   US Pelvis Complete   US Transvaginal Non-OB   Urine Culture   Pyuria       Relevant Orders   Urine Culture   Encounter for therapeutic drug monitoring  Relevant Orders   COMPLETE METABOLIC PANEL WITH GFR   Hyperlipidemia, unspecified hyperlipidemia type       Relevant Orders   Lipid panel       Follow up plan: No Follow-up on file.  An after-visit summary was printed and given to the patient at [place].  Please see the patient instructions which may contain other information and recommendations beyond what is mentioned above in the assessment and plan.  No orders of the defined types were placed in this encounter.   Orders Placed This Encounter  Procedures  . Urine Culture  . US Pelvis Complete  . US Transvaginal Non-OB  . COMPLETE METABOLIC PANEL WITH GFR  . CBC with Differential/Platelet  . Lipid panel  . POCT urinalysis dipstick

## 2017-08-09 NOTE — Assessment & Plan Note (Signed)
Undergoing active treatment for her breast cancer with radiation treatment

## 2017-08-09 NOTE — Progress Notes (Signed)
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  INTERVAL HISTORY: Patient returns to clinic today for further evaluation and consideration of cycle 14 of 18 of maintenance Herceptin. She is tolerating her treatments well without significant side effects. She currently feels well and is asymptomatic. She continues to be anxious. She has no neurologic complaints. She denies any recent fevers or illnesses. She has a good appetite and denies weight loss. She has no chest pain or shortness of breath. She denies any nausea, vomiting, constipation, or diarrhea. She has no urinary complaints. Patient offers no specific complaints today.  REVIEW OF SYSTEMS:   Review of Systems  Constitutional: Negative.  Negative for fever, malaise/fatigue and weight loss.  Respiratory: Negative.  Negative for cough and shortness of breath.   Cardiovascular: Negative.  Negative for chest pain and leg swelling.  Gastrointestinal: Negative.  Negative for abdominal pain.  Genitourinary: Negative.   Musculoskeletal: Negative.   Skin: Negative.  Negative for rash.  Neurological: Negative.  Negative for sensory change and weakness.  Psychiatric/Behavioral: Negative for depression. The patient is nervous/anxious.     As per HPI. Otherwise, a complete review of systems is negative.  PAST MEDICAL HISTORY: Past Medical History:  Diagnosis Date  . Anemia   . Breast cancer ([place])   . Breast mass, left 09/22/2016   RECOMMENDATION: Ultrasound-guided  core biopsies of masses at left breast 2 o'clock 2 cm from nipple, left breast 2 o'clock 5 cm from nipple, abnormal left axillary lymph nodes.  Marland Kitchen GERD (gastroesophageal reflux disease)   . Hyperglycemia   . Hyperlipidemia   . Obesity   . Postmenopausal     PAST SURGICAL HISTORY: Past Surgical History:  Procedure Laterality Date  . ABDOMINAL HYSTERECTOMY    . AXILLARY SENTINEL NODE BIOPSY Left 04/14/2017   Procedure: AXILLARY SENTINEL NODE BIOPSY;  Surgeon: Olean Ree, MD;  Location: [facility] ORS;  Service: General;  Laterality: Left;  . BREAST BIOPSY Left 09/30/2016   US biopsy of 3 areas + chemo  . ESOPHAGOGASTRODUODENOSCOPY (EGD) WITH PROPOFOL N/A 12/24/2014   Procedure: ESOPHAGOGASTRODUODENOSCOPY (EGD) WITH PROPOFOL;  Surgeon: Manya Silvas, MD;  Location: [facility] ENDOSCOPY;  Service: Endoscopy;  Laterality: N/A;  . PORTACATH PLACEMENT Right 10/27/2016   Procedure: INSERTION PORT-A-CATH;  Surgeon: Nestor Lewandowsky, MD;  Location: [facility] ORS;  Service: General;  Laterality: Right;  . TONSILECTOMY, ADENOIDECTOMY, BILATERAL MYRINGOTOMY AND TUBES    . TONSILLECTOMY    . TOTAL MASTECTOMY Left 04/14/2017   Procedure: TOTAL MASTECTOMY;  Surgeon: Olean Ree, MD;  Location: [facility] ORS;  Service: General;  Laterality: Left;    FAMILY HISTORY: Family History  Problem Relation Age of Onset  . Stroke Mother   . Stroke Father   . Cancer Neg Hx   . Depression Neg Hx     ADVANCED DIRECTIVES (Y/N):  N  HEALTH MAINTENANCE: Social History   Tobacco Use  . Smoking status: Never Smoker  . Smokeless tobacco: Never Used  Substance Use Topics  . Alcohol use: No    Alcohol/week: 0.0 oz  . Drug use: No  Colonoscopy:  PAP:  Bone density:  Lipid panel:  Allergies  Allergen Reactions  . Latex Itching  . Penicillins Itching and Rash    Has patient had a PCN reaction causing immediate rash, facial/tongue/throat swelling, SOB or lightheadedness with hypotension: Yes Has patient had a PCN  reaction causing severe rash involving mucus membranes or skin necrosis: No Has patient had a PCN reaction that required hospitalization No Has patient had a PCN reaction occurring within the last 10 years: No If all of the above answers are "NO", then may proceed with Cephalosporin use.     Current Outpatient Medications  Medication Sig Dispense Refill  . acetaminophen (TYLENOL) 500 MG tablet Take 1,000 mg by mouth every 6 (six) hours as needed for mild pain.    Marland Kitchen aspirin 81 MG tablet Take 81 mg by mouth daily.    Marland Kitchen atorvastatin (LIPITOR) 40 MG tablet Take 40 mg by mouth daily.     . Cholecalciferol (VITAMIN D) 2000 units tablet Take 2,000 Units by mouth 3 (three) times a week. One by mouth twice a week    . ferrous sulfate 325 (65 FE) MG EC tablet Take 325 mg by mouth 3 (three) times a week.     . fluticasone (FLONASE) 50 MCG/ACT nasal spray Place 1 spray into both nostrils daily as needed for allergies or rhinitis.    . Multiple Vitamin (MULTIVITAMIN WITH MINERALS) TABS tablet Take 1 tablet by mouth 3 (three) times a week.     Vladimir Faster Glycol-Propyl Glycol (SYSTANE OP) Place 1 drop into both eyes daily as needed (dry eyes).     . silver sulfADIAZINE (SILVADENE) 1 % cream Apply 1 application topically 2 (two) times daily. 50 g 3   No current facility-administered medications for this visit.    Facility-Administered Medications Ordered in Other Visits  Medication Dose Route Frequency Provider Last Rate Last Dose  . heparin lock flush 100 unit/mL  500 Units Intravenous Once Lloyd Huger, MD      . sodium chloride flush (NS) 0.9 % injection 10 mL  10 mL Intravenous PRN Lloyd Huger, MD   10 mL at 08/11/17 0808    OBJECTIVE: Vitals:   08/11/17 0829  BP: 137/69  Pulse: 72  Resp: 18  Temp: 98.1 F (36.7 C)     Body mass index is 31.12 kg/m.    ECOG FS:0 - Asymptomatic  General: Well-developed, well-nourished, no acute distress. Eyes: Pink conjunctiva, anicteric  sclera. Breasts: Left mastectomy. Chest wall: Port site without erythema or induration. Lungs: Clear to auscultation bilaterally. Heart: Regular rate and rhythm. No rubs, murmurs, or gallops. Abdomen: Soft, nontender, nondistended. No organomegaly noted, normoactive bowel sounds. Musculoskeletal: No edema, cyanosis, or clubbing. Neuro: Alert, answering all questions appropriately. Cranial nerves grossly intact. Skin: No rashes or petechiae noted. Psych: Normal affect.  LAB RESULTS:  Lab Results  Component Value Date   NA 144 08/09/2017   K 4.3 08/09/2017   CL 112 (H) 08/09/2017   CO2 24 08/09/2017   GLUCOSE 80 08/09/2017   BUN 21 08/09/2017   CREATININE 1.28 (H) 08/09/2017   CALCIUM 9.6 08/09/2017   PROT 7.0 08/09/2017   ALBUMIN 3.4 (L) 07/14/2017   AST 19 08/09/2017   ALT 16 08/09/2017   ALKPHOS 188 (H) 07/14/2017   BILITOT 0.4 08/09/2017   GFRNONAA 40 (L) 08/09/2017   GFRAA 46 (L) 08/09/2017    Lab Results  Component Value Date   WBC 3.8 08/11/2017   NEUTROABS  8,252 (H) 08/09/2017   HGB 10.3 (L) 08/11/2017   HCT 31.5 (L) 08/11/2017   MCV 93.3 08/11/2017   PLT 180 08/11/2017     STUDIES: Nm Cardiac Muga Rest  Result Date: 07/23/2017 CLINICAL DATA:  LEFT breast cancer, high risk chemotherapy EXAM: NUCLEAR MEDICINE CARDIAC BLOOD POOL IMAGING (MUGA) TECHNIQUE: Cardiac multi-gated acquisition was performed at rest following intravenous injection of Tc-63mlabeled red blood cells. RADIOPHARMACEUTICALS:  22.94 mCi Tc-957mertechnetate in-vitro labeled autologous red blood cells IV COMPARISON:  05/05/2017 FINDINGS: Calculated LEFT ventricular ejection fraction is 61%, increased from the 54% on the previous exam. Patient was rhythmic during imaging. Study was performed at a cardiac rate of 81 beats per minute. No focal LEFT ventricular wall motion abnormalities. IMPRESSION: Normal LEFT ventricular ejection fraction of 61% increase from the 54% on 05/05/2017. No wall motion  abnormalities. Electronically Signed   By: MaLavonia Dana.D.   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  PLAN:    1. 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 She completed neoadjuvant Taxotere, carboplatinum, Herceptin, and Perjeta on February 17, 2017. She had a total mastectomy on April 14, 2017, therefore did not require adjuvant XRT. Continue with maintenance Herceptin every 3 weeks for a total of 18 infusions.  An aromatase inhibitor would not offer benefit given the ER/PR negativity of her disease. Repeat MUGA scan on July 23, 2017 revealed an EF of 61% which is mildly improved from previous. Proceed with cycle 14 of Herceptin today. Return to clinic in 3 weeks for further evaluation and consideration of cycle 15. 2. Anemia: Mild, monitor. 3. Leukocytosis: Resolved. Secondary to Neulasta. Monitor.  4. Renal insufficiency: Creatinine is only mildly elevated today, monitor. 5. Hypertension: Blood pressure is within normal limits today.  Patient expressed understanding and was in agreement with this plan. She also understands that She can call clinic at any time with any questions, concerns, or complaints.   Cancer Staging Malignant neoplasm of upper-outer quadrant of left female breast [facility] Breast, AJCC 8th Edition - Clinical stage from 10/11/2016: Stage IIB (cT2, cN1, cM0, G3, ER: Negative, PR: Negative, HER2: Positive) - Signed by FiLloyd HugerMD on 10/11/2016 - Pathologic stage from 04/23/2017: No Stage Recommended (ypT0, pN0, cM0, G3, ER: Negative, PR: Negative, HER2: Positive) - Signed by FiLloyd HugerMD on 04/23/2017   TiLloyd HugerMD   08/11/2017 9:13 AM

## 2017-08-09 NOTE — Patient Instructions (Addendum)
We'll get the urine culture We'll schedule you for ultrasound If you have not heard anything from my staff in a week about any orders/referrals/studies from today, please contact us here to follow-up [phone] Please let me know if you continue to have symptoms and we need to continue to do tests

## 2017-08-09 NOTE — Assessment & Plan Note (Signed)
Avoid NSAIDs; stay well-hydrated

## 2017-08-10 ENCOUNTER — Other Ambulatory Visit: Payer: Self-pay | Admitting: Oncology

## 2017-08-10 ENCOUNTER — Ambulatory Visit: Payer: Medicare Other

## 2017-08-10 ENCOUNTER — Ambulatory Visit
Admission: RE | Admit: 2017-08-10 | Discharge: 2017-08-10 | Disposition: A | Discharge status: Home / Self Care | Payer: Medicare Other | Source: Ambulatory Visit | Attending: Radiation Oncology | Admitting: Radiation Oncology

## 2017-08-10 DIAGNOSIS — Z9012 Acquired absence of left breast and nipple: Secondary | ICD-10-CM | POA: Diagnosis not present

## 2017-08-10 DIAGNOSIS — Z171 Estrogen receptor negative status [ER-]: Secondary | ICD-10-CM | POA: Diagnosis not present

## 2017-08-10 DIAGNOSIS — E785 Hyperlipidemia, unspecified: Secondary | ICD-10-CM | POA: Diagnosis not present

## 2017-08-10 DIAGNOSIS — K219 Gastro-esophageal reflux disease without esophagitis: Secondary | ICD-10-CM | POA: Diagnosis not present

## 2017-08-10 DIAGNOSIS — Z79899 Other long term (current) drug therapy: Secondary | ICD-10-CM | POA: Diagnosis not present

## 2017-08-10 DIAGNOSIS — C50412 Malignant neoplasm of upper-outer quadrant of left female breast: Secondary | ICD-10-CM | POA: Diagnosis not present

## 2017-08-10 DIAGNOSIS — Z7982 Long term (current) use of aspirin: Secondary | ICD-10-CM | POA: Diagnosis not present

## 2017-08-10 DIAGNOSIS — Z510 Encounter for antineoplastic radiation therapy: Secondary | ICD-10-CM | POA: Diagnosis not present

## 2017-08-10 DIAGNOSIS — R739 Hyperglycemia, unspecified: Secondary | ICD-10-CM | POA: Diagnosis not present

## 2017-08-10 LAB — COMPLETE METABOLIC PANEL WITH GFR
AG RATIO: 1.4 (calc) (ref 1.0–2.5)
ALBUMIN MSPROF: 4.1 g/dL (ref 3.6–5.1)
ALKALINE PHOSPHATASE (APISO): 147 U/L — AB (ref 33–130)
ALT: 16 U/L (ref 6–29)
AST: 19 U/L (ref 10–35)
BUN / CREAT RATIO: 16 (calc) (ref 6–22)
BUN: 21 mg/dL (ref 7–25)
CO2: 24 mmol/L (ref 20–32)
Calcium: 9.6 mg/dL (ref 8.6–10.4)
Chloride: 112 mmol/L — ABNORMAL HIGH (ref 98–110)
Creat: 1.28 mg/dL — ABNORMAL HIGH (ref 0.60–0.93)
GFR, Est African American: 46 mL/min/{1.73_m2} — ABNORMAL LOW (ref >=60)
GFR, Est Non African American: 40 mL/min/{1.73_m2} — ABNORMAL LOW (ref >=60)
GLOBULIN: 2.9 g/dL (ref 1.9–3.7)
Glucose, Bld: 80 mg/dL (ref 65–99)
POTASSIUM: 4.3 mmol/L (ref 3.5–5.3)
SODIUM: 144 mmol/L (ref 135–146)
Total Bilirubin: 0.4 mg/dL (ref 0.2–1.2)
Total Protein: 7 g/dL (ref 6.1–8.1)

## 2017-08-10 LAB — LIPID PANEL
CHOL/HDL RATIO: 3.7 (calc) (ref <5.0)
CHOLESTEROL: 303 mg/dL — AB (ref <200)
HDL: 82 mg/dL (ref >50)
LDL Cholesterol (Calc): 192 mg/dL (calc) — ABNORMAL HIGH
Non-HDL Cholesterol (Calc): 221 mg/dL (calc) — ABNORMAL HIGH (ref <130)
TRIGLYCERIDES: 135 mg/dL (ref <150)

## 2017-08-10 LAB — CBC WITH DIFFERENTIAL/PLATELET
BASOS PCT: 0.7 %
Basophils Absolute: 85 cells/uL (ref 0–200)
Eosinophils Absolute: 133 cells/uL (ref 15–500)
Eosinophils Relative: 1.1 %
HCT: 32.5 % — ABNORMAL LOW (ref 35.0–45.0)
Hemoglobin: 11.1 g/dL — ABNORMAL LOW (ref 11.7–15.5)
Lymphs Abs: 2698 cells/uL (ref 850–3900)
MCH: 27.2 pg (ref 27.0–33.0)
MCHC: 34.2 g/dL (ref 32.0–36.0)
MCV: 79.7 fL — AB (ref 80.0–100.0)
MONOS PCT: 7.7 %
MPV: 10.5 fL (ref 7.5–12.5)
NEUTROS PCT: 68.2 %
Neutro Abs: 8252 cells/uL — ABNORMAL HIGH (ref 1500–7800)
PLATELETS: 291 10*3/uL (ref 140–400)
RBC: 4.08 10*6/uL (ref 3.80–5.10)
RDW: 15.6 % — AB (ref 11.0–15.0)
TOTAL LYMPHOCYTE: 22.3 %
WBC: 12.1 10*3/uL — AB (ref 3.8–10.8)
WBCMIX: 932 {cells}/uL (ref 200–950)

## 2017-08-10 LAB — URINE CULTURE
MICRO NUMBER:: 90085374
RESULT: NO GROWTH
SPECIMEN QUALITY:: ADEQUATE

## 2017-08-11 ENCOUNTER — Telehealth: Payer: Self-pay

## 2017-08-11 ENCOUNTER — Inpatient Hospital Stay (HOSPITAL_BASED_OUTPATIENT_CLINIC_OR_DEPARTMENT_OTHER): Payer: Medicare Other | Admitting: Oncology

## 2017-08-11 ENCOUNTER — Inpatient Hospital Stay: Payer: Medicare Other

## 2017-08-11 ENCOUNTER — Ambulatory Visit
Admission: RE | Admit: 2017-08-11 | Discharge: 2017-08-11 | Disposition: A | Discharge status: Home / Self Care | Payer: Medicare Other | Source: Ambulatory Visit | Attending: Radiation Oncology | Admitting: Radiation Oncology

## 2017-08-11 ENCOUNTER — Other Ambulatory Visit: Payer: Self-pay | Admitting: Family Medicine

## 2017-08-11 VITALS — BP 137/69 | HR 72 | Temp 98.1°F | Resp 18 | Wt 164.7 lb

## 2017-08-11 DIAGNOSIS — K219 Gastro-esophageal reflux disease without esophagitis: Secondary | ICD-10-CM | POA: Diagnosis not present

## 2017-08-11 DIAGNOSIS — N289 Disorder of kidney and ureter, unspecified: Secondary | ICD-10-CM | POA: Diagnosis not present

## 2017-08-11 DIAGNOSIS — R739 Hyperglycemia, unspecified: Secondary | ICD-10-CM | POA: Diagnosis not present

## 2017-08-11 DIAGNOSIS — Z5112 Encounter for antineoplastic immunotherapy: Secondary | ICD-10-CM | POA: Diagnosis not present

## 2017-08-11 DIAGNOSIS — C50412 Malignant neoplasm of upper-outer quadrant of left female breast: Secondary | ICD-10-CM | POA: Diagnosis not present

## 2017-08-11 DIAGNOSIS — E785 Hyperlipidemia, unspecified: Secondary | ICD-10-CM | POA: Diagnosis not present

## 2017-08-11 DIAGNOSIS — Z7982 Long term (current) use of aspirin: Secondary | ICD-10-CM | POA: Diagnosis not present

## 2017-08-11 DIAGNOSIS — Z171 Estrogen receptor negative status [ER-]: Secondary | ICD-10-CM

## 2017-08-11 DIAGNOSIS — D649 Anemia, unspecified: Secondary | ICD-10-CM | POA: Diagnosis not present

## 2017-08-11 DIAGNOSIS — Z9012 Acquired absence of left breast and nipple: Secondary | ICD-10-CM | POA: Diagnosis not present

## 2017-08-11 DIAGNOSIS — Z79899 Other long term (current) drug therapy: Secondary | ICD-10-CM | POA: Diagnosis not present

## 2017-08-11 DIAGNOSIS — Z510 Encounter for antineoplastic radiation therapy: Secondary | ICD-10-CM | POA: Diagnosis not present

## 2017-08-11 LAB — CBC
HEMATOCRIT: 31.5 % — AB (ref 35.0–47.0)
Hemoglobin: 10.3 g/dL — ABNORMAL LOW (ref 12.0–16.0)
MCH: 30.7 pg (ref 26.0–34.0)
MCHC: 32.9 g/dL (ref 32.0–36.0)
MCV: 93.3 fL (ref 80.0–100.0)
PLATELETS: 180 10*3/uL (ref 150–440)
RBC: 3.37 MIL/uL — ABNORMAL LOW (ref 3.80–5.20)
RDW: 15.9 % — ABNORMAL HIGH (ref 11.5–14.5)
WBC: 3.8 10*3/uL (ref 3.6–11.0)

## 2017-08-11 MED ORDER — HEPARIN SOD (PORK) LOCK FLUSH 100 UNIT/ML IV SOLN: 500.0000 [IU] | Freq: Once | INTRAVENOUS | Status: DC | PRN

## 2017-08-11 MED ORDER — TRASTUZUMAB CHEMO 150 MG IV SOLR
450.0000 mg | Freq: Once | INTRAVENOUS | Status: AC
  Administered 2017-08-11: 450 mg via INTRAVENOUS
  Filled 2017-08-11: qty 21.43

## 2017-08-11 MED ORDER — SODIUM CHLORIDE 0.9% FLUSH
10.0000 mL | INTRAVENOUS | Status: DC | PRN
  Administered 2017-08-11 (×2): 10 mL via INTRAVENOUS
  Filled 2017-08-11: qty 10

## 2017-08-11 MED ORDER — DIPHENHYDRAMINE HCL 25 MG PO CAPS
25.0000 mg | ORAL_CAPSULE | Freq: Once | ORAL | Status: AC
  Administered 2017-08-11: 25 mg via ORAL
  Filled 2017-08-11: qty 1

## 2017-08-11 MED ORDER — CHOLESTYRAMINE 4 GM/DOSE PO POWD: ORAL | 0 refills | Status: DC

## 2017-08-11 MED ORDER — ACETAMINOPHEN 325 MG PO TABS
650.0000 mg | ORAL_TABLET | Freq: Once | ORAL | Status: AC
  Administered 2017-08-11: 650 mg via ORAL
  Filled 2017-08-11: qty 2

## 2017-08-11 MED ORDER — HEPARIN SOD (PORK) LOCK FLUSH 100 UNIT/ML IV SOLN
500.0000 [IU] | Freq: Once | INTRAVENOUS | Status: AC
  Administered 2017-08-11: 500 [IU] via INTRAVENOUS
  Filled 2017-08-11: qty 5

## 2017-08-11 MED ORDER — SODIUM CHLORIDE 0.9 % IV SOLN
Freq: Once | INTRAVENOUS | Status: AC
  Administered 2017-08-11: 10:00:00 via INTRAVENOUS
  Filled 2017-08-11: qty 1000

## 2017-08-11 MED ORDER — SODIUM CHLORIDE 0.9% FLUSH
10.0000 mL | INTRAVENOUS | Status: DC | PRN
  Filled 2017-08-11: qty 10

## 2017-08-11 NOTE — Progress Notes (Signed)
Patient is here today for follow up and treatment. She is doing well no major complaints.

## 2017-08-11 NOTE — Telephone Encounter (Signed)
Called pt informed her of lab results as well as the need for medication and no statin. Pt gave verbal understanding, but states that when she was here you discussed that she may have an infection, pt is worried about this. Advised pt that urine showed no infection, however she wanted your input. Please advise.

## 2017-08-11 NOTE — Telephone Encounter (Signed)
-----   Message from Arnetha Courser, MD sent at 08/11/2017  2:08 PM EST ----- Guerry Minors, please let the patient know that her urine did not grow out any infection; her cholesterol has really gone up (more than 100 total points in the last 6 months); it looks like her liver enzymes were high for a while, but are coming down; in the meantime, we'll urge her to really watch her diet (avoid foods that come from pigs and cows); consider eating Cheerios or oatmeal for breakfast; make sure that she's NOT taking her statin; I'll start [place] for her cholesterol (just follow directions on the prescription); other labs are relatively stable; thank you

## 2017-08-11 NOTE — Telephone Encounter (Signed)
Please ask if any skin infections, sore throat, cough, etc, anything she has noticed. Encourage her to watch carefully for anything and let us or on call doc know if she feels sick

## 2017-08-11 NOTE — Progress Notes (Signed)
Questran for lipids

## 2017-08-12 ENCOUNTER — Ambulatory Visit
Admission: RE | Admit: 2017-08-12 | Discharge: 2017-08-12 | Disposition: A | Discharge status: Home / Self Care | Payer: Medicare Other | Source: Ambulatory Visit | Attending: Radiation Oncology | Admitting: Radiation Oncology

## 2017-08-12 ENCOUNTER — Ambulatory Visit: Payer: Medicare Other

## 2017-08-12 DIAGNOSIS — K219 Gastro-esophageal reflux disease without esophagitis: Secondary | ICD-10-CM | POA: Diagnosis not present

## 2017-08-12 DIAGNOSIS — Z9012 Acquired absence of left breast and nipple: Secondary | ICD-10-CM | POA: Diagnosis not present

## 2017-08-12 DIAGNOSIS — Z7982 Long term (current) use of aspirin: Secondary | ICD-10-CM | POA: Diagnosis not present

## 2017-08-12 DIAGNOSIS — Z171 Estrogen receptor negative status [ER-]: Secondary | ICD-10-CM | POA: Diagnosis not present

## 2017-08-12 DIAGNOSIS — R739 Hyperglycemia, unspecified: Secondary | ICD-10-CM | POA: Diagnosis not present

## 2017-08-12 DIAGNOSIS — E785 Hyperlipidemia, unspecified: Secondary | ICD-10-CM | POA: Diagnosis not present

## 2017-08-12 DIAGNOSIS — C50412 Malignant neoplasm of upper-outer quadrant of left female breast: Secondary | ICD-10-CM | POA: Diagnosis not present

## 2017-08-12 DIAGNOSIS — Z510 Encounter for antineoplastic radiation therapy: Secondary | ICD-10-CM | POA: Diagnosis not present

## 2017-08-12 DIAGNOSIS — Z79899 Other long term (current) drug therapy: Secondary | ICD-10-CM | POA: Diagnosis not present

## 2017-08-12 NOTE — Telephone Encounter (Signed)
Called pt she states that she cannot really recall if she has had any recent illness as she has a lot going on. Pt states that she really wants to have u/s done. Advised pt that u/s has been ordered and she will get a call to schedule.

## 2017-08-13 ENCOUNTER — Ambulatory Visit
Admission: RE | Admit: 2017-08-13 | Discharge: 2017-08-13 | Disposition: A | Discharge status: Home / Self Care | Payer: Medicare Other | Source: Ambulatory Visit | Attending: Radiation Oncology | Admitting: Radiation Oncology

## 2017-08-13 ENCOUNTER — Ambulatory Visit: Payer: Medicare Other

## 2017-08-13 DIAGNOSIS — Z171 Estrogen receptor negative status [ER-]: Secondary | ICD-10-CM | POA: Diagnosis not present

## 2017-08-13 DIAGNOSIS — R739 Hyperglycemia, unspecified: Secondary | ICD-10-CM | POA: Diagnosis not present

## 2017-08-13 DIAGNOSIS — K219 Gastro-esophageal reflux disease without esophagitis: Secondary | ICD-10-CM | POA: Diagnosis not present

## 2017-08-13 DIAGNOSIS — E785 Hyperlipidemia, unspecified: Secondary | ICD-10-CM | POA: Diagnosis not present

## 2017-08-13 DIAGNOSIS — Z7982 Long term (current) use of aspirin: Secondary | ICD-10-CM | POA: Diagnosis not present

## 2017-08-13 DIAGNOSIS — Z510 Encounter for antineoplastic radiation therapy: Secondary | ICD-10-CM | POA: Diagnosis not present

## 2017-08-13 DIAGNOSIS — Z79899 Other long term (current) drug therapy: Secondary | ICD-10-CM | POA: Diagnosis not present

## 2017-08-13 DIAGNOSIS — Z9012 Acquired absence of left breast and nipple: Secondary | ICD-10-CM | POA: Diagnosis not present

## 2017-08-13 DIAGNOSIS — C50412 Malignant neoplasm of upper-outer quadrant of left female breast: Secondary | ICD-10-CM | POA: Diagnosis not present

## 2017-08-16 ENCOUNTER — Ambulatory Visit
Admission: RE | Admit: 2017-08-16 | Discharge: 2017-08-16 | Disposition: A | Discharge status: Home / Self Care | Payer: Medicare Other | Source: Ambulatory Visit | Attending: Radiation Oncology | Admitting: Radiation Oncology

## 2017-08-16 ENCOUNTER — Ambulatory Visit: Payer: Medicare Other

## 2017-08-16 DIAGNOSIS — R739 Hyperglycemia, unspecified: Secondary | ICD-10-CM | POA: Diagnosis not present

## 2017-08-16 DIAGNOSIS — Z9012 Acquired absence of left breast and nipple: Secondary | ICD-10-CM | POA: Diagnosis not present

## 2017-08-16 DIAGNOSIS — Z510 Encounter for antineoplastic radiation therapy: Secondary | ICD-10-CM | POA: Diagnosis not present

## 2017-08-16 DIAGNOSIS — E785 Hyperlipidemia, unspecified: Secondary | ICD-10-CM | POA: Diagnosis not present

## 2017-08-16 DIAGNOSIS — Z171 Estrogen receptor negative status [ER-]: Secondary | ICD-10-CM | POA: Diagnosis not present

## 2017-08-16 DIAGNOSIS — Z79899 Other long term (current) drug therapy: Secondary | ICD-10-CM | POA: Diagnosis not present

## 2017-08-16 DIAGNOSIS — K219 Gastro-esophageal reflux disease without esophagitis: Secondary | ICD-10-CM | POA: Diagnosis not present

## 2017-08-16 DIAGNOSIS — Z7982 Long term (current) use of aspirin: Secondary | ICD-10-CM | POA: Diagnosis not present

## 2017-08-16 DIAGNOSIS — C50412 Malignant neoplasm of upper-outer quadrant of left female breast: Secondary | ICD-10-CM | POA: Diagnosis not present

## 2017-08-17 ENCOUNTER — Ambulatory Visit: Payer: Medicare Other

## 2017-08-27 NOTE — Progress Notes (Signed)
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  INTERVAL HISTORY: Patient returns to clinic today for further evaluation and consideration of cycle 15 of 18 of maintenance Herceptin. She currently feels well and is asymptomatic. She continues to be anxious. She has no neurologic complaints. She denies any recent fevers or illnesses. She has a good appetite and denies weight loss. She has no chest pain or shortness of breath. She denies any nausea, vomiting, constipation, or diarrhea. She has no urinary complaints. Patient offers no specific complaints today.  REVIEW OF SYSTEMS:   Review of Systems  Constitutional: Negative.  Negative for fever, malaise/fatigue and weight loss.  Respiratory: Negative.  Negative for cough and shortness of breath.   Cardiovascular: Negative.  Negative for chest pain and leg swelling.  Gastrointestinal: Negative.  Negative for abdominal pain.  Genitourinary: Negative.   Musculoskeletal: Negative.   Skin: Negative.  Negative for rash.  Neurological: Negative.  Negative for sensory change and weakness.  Psychiatric/Behavioral: Negative for depression. The patient is nervous/anxious.     As per HPI. Otherwise, a complete review of systems is negative.  PAST MEDICAL HISTORY: Past Medical History:  Diagnosis Date  . Anemia   . Breast cancer ([place])   . Breast mass, left 09/22/2016   RECOMMENDATION: Ultrasound-guided core biopsies of masses at left breast 2 o'clock 2 cm from nipple, left  breast 2 o'clock 5 cm from nipple, abnormal left axillary lymph nodes.  Marland Kitchen GERD (gastroesophageal reflux disease)   . Hyperglycemia   . Hyperlipidemia   . Obesity   . Postmenopausal     PAST SURGICAL HISTORY: Past Surgical History:  Procedure Laterality Date  . ABDOMINAL HYSTERECTOMY    . AXILLARY SENTINEL NODE BIOPSY Left 04/14/2017   Procedure: AXILLARY SENTINEL NODE BIOPSY;  Surgeon: Olean Ree, MD;  Location: [facility] ORS;  Service: General;  Laterality: Left;  . BREAST BIOPSY Left 09/30/2016   US biopsy of 3 areas + chemo  . ESOPHAGOGASTRODUODENOSCOPY (EGD) WITH PROPOFOL N/A 12/24/2014   Procedure: ESOPHAGOGASTRODUODENOSCOPY (EGD) WITH PROPOFOL;  Surgeon: Manya Silvas, MD;  Location: [facility] ENDOSCOPY;  Service: Endoscopy;  Laterality: N/A;  . PORTACATH PLACEMENT Right 10/27/2016   Procedure: INSERTION PORT-A-CATH;  Surgeon: Nestor Lewandowsky, MD;  Location: [facility] ORS;  Service: General;  Laterality: Right;  . TONSILECTOMY, ADENOIDECTOMY, BILATERAL MYRINGOTOMY AND TUBES    . TONSILLECTOMY    . TOTAL MASTECTOMY Left 04/14/2017   Procedure: TOTAL MASTECTOMY;  Surgeon: Olean Ree, MD;  Location: [facility] ORS;  Service: General;  Laterality: Left;    FAMILY HISTORY: Family History  Problem Relation Age of Onset  . Stroke Mother   . Stroke Father   . Cancer Neg Hx   . Depression Neg Hx     ADVANCED DIRECTIVES (Y/N):  N  HEALTH MAINTENANCE: Social History   Tobacco Use  . Smoking status: Never Smoker  . Smokeless tobacco: Never Used  Substance Use Topics  . Alcohol use: No    Alcohol/week: 0.0 oz  . Drug use: No     Colonoscopy:  PAP:  Bone density:  Lipid panel:  Allergies  Allergen Reactions  . Latex Itching  . Penicillins Itching and Rash    Has patient had a PCN reaction causing immediate rash, facial/tongue/throat swelling, SOB or lightheadedness with hypotension: Yes Has patient had a PCN reaction causing severe rash involving mucus membranes or skin necrosis:  No Has patient had a PCN reaction that required hospitalization No Has patient had a PCN reaction occurring within the last 10 years: No If all of the above answers are "NO", then may proceed with Cephalosporin use.     Current Outpatient Medications  Medication Sig Dispense Refill  . acetaminophen (TYLENOL) 500 MG tablet Take 1,000 mg by mouth every 6 (six) hours as needed for mild pain.    Marland Kitchen aspirin 81 MG tablet Take 81 mg by mouth daily.    . Cholecalciferol (VITAMIN D) 2000 units tablet Take 2,000 Units by mouth 3 (three) times a week. One by mouth twice a week    . ferrous sulfate 325 (65 FE) MG EC tablet Take 325 mg by mouth 3 (three) times a week.     . fluticasone (FLONASE) 50 MCG/ACT nasal spray Place 1 spray into both nostrils daily as needed for allergies or rhinitis.    . Multiple Vitamin (MULTIVITAMIN WITH MINERALS) TABS tablet Take 1 tablet by mouth 3 (three) times a week.     Vladimir Faster Glycol-Propyl Glycol (SYSTANE OP) Place 1 drop into both eyes daily as needed (dry eyes).     . silver sulfADIAZINE (SILVADENE) 1 % cream Apply 1 application topically 2 (two) times daily. 50 g 3  . cholestyramine (QUESTRAN) 4 GM/DOSE powder Four grams once a day before a meal x 1 week, then twice a day before meals x 1 week, then three times a day before meals (Patient not taking: Reported on 09/01/2017) 378 g 0   No current facility-administered medications for this visit.     OBJECTIVE: Vitals:   09/01/17 0922  BP: 134/74  Pulse: 83  Resp: 16  Temp: 98.8 F (37.1 C)     Body mass index is 31.18 kg/m.    ECOG FS:0 - Asymptomatic  General: Well-developed, well-nourished, no acute distress. Eyes: Pink conjunctiva, anicteric sclera. Breasts: Left mastectomy. Chest wall: Port site without erythema or induration. Lungs: Clear to auscultation bilaterally. Heart: Regular rate and rhythm. No rubs, murmurs, or gallops. Abdomen: Soft, nontender, nondistended. No organomegaly noted,  normoactive bowel sounds. Musculoskeletal: No edema, cyanosis, or clubbing. Neuro: Alert, answering all questions appropriately. Cranial nerves grossly intact. Skin: No rashes or petechiae noted. Psych: Normal affect.  LAB RESULTS:  Lab Results  Component Value Date   NA 144 08/09/2017   K 4.3 08/09/2017   CL 112 (H) 08/09/2017   CO2 24 08/09/2017   GLUCOSE 80 08/09/2017   BUN 21 08/09/2017   CREATININE 1.28 (H) 08/09/2017   CALCIUM 9.6 08/09/2017   PROT 7.0 08/09/2017   ALBUMIN 3.4 (L) 07/14/2017   AST 19 08/09/2017   ALT 16 08/09/2017   ALKPHOS 188 (H) 07/14/2017   BILITOT 0.4 08/09/2017   GFRNONAA 40 (L) 08/09/2017   GFRAA 46 (L) 08/09/2017    Lab Results  Component Value Date   WBC 3.8 08/11/2017   NEUTROABS 8,252 (H) 08/09/2017   HGB 10.3 (L) 08/11/2017   HCT 31.5 (L) 08/11/2017   MCV 93.3 08/11/2017   PLT 180 08/11/2017     STUDIES: No results found.  ASSESSMENT: Pathologic stage 0 ER/PR negative, HER-2 positive invasive carcinoma of the upper outer  quadrant of the left breast.  PLAN:    1. 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 She completed neoadjuvant Taxotere, carboplatinum, Herceptin, and Perjeta on February 17, 2017. She had a total mastectomy on April 14, 2017, therefore did not require adjuvant XRT. Continue with maintenance Herceptin every 3 weeks for a total of 18 infusions.  An aromatase inhibitor would not offer benefit given the ER/PR negativity of her disease. Repeat MUGA scan on July 23, 2017 revealed an EF of 61% which is mildly improved from previous. Proceed with cycle 15 of Herceptin today. Return to clinic in 3 weeks for further evaluation and consideration of cycle 16. 2. Anemia: Mild, monitor. 3. Renal insufficiency: Creatinine is only mildly elevated today, monitor. 4. Hypertension:  Blood pressure is within normal limits today.  Patient expressed understanding and was in agreement with this plan. She also understands that She can call clinic at any time with any questions, concerns, or complaints.   Cancer Staging Malignant neoplasm of upper-outer quadrant of left female breast [facility]) Staging form: Breast, AJCC 8th Edition - Clinical stage from 10/11/2016: Stage IIB (cT2, cN1, cM0, G3, ER: Negative, PR: Negative, HER2: Positive) - Signed by Lloyd Huger, MD on 10/11/2016 - Pathologic stage from 04/23/2017: No Stage Recommended (ypT0, pN0, cM0, G3, ER: Negative, PR: Negative, HER2: Positive) - Signed by Lloyd Huger, MD on 04/23/2017   Lloyd Huger, MD   09/04/2017 7:13 AM

## 2017-08-31 ENCOUNTER — Other Ambulatory Visit: Payer: Self-pay | Admitting: *Deleted

## 2017-08-31 DIAGNOSIS — C50919 Malignant neoplasm of unspecified site of unspecified female breast: Secondary | ICD-10-CM

## 2017-08-31 NOTE — Progress Notes (Signed)
cbc

## 2017-09-01 ENCOUNTER — Inpatient Hospital Stay: Payer: Medicare Other | Attending: Oncology | Admitting: Oncology

## 2017-09-01 ENCOUNTER — Inpatient Hospital Stay: Payer: Medicare Other

## 2017-09-01 ENCOUNTER — Encounter: Payer: Self-pay | Admitting: Oncology

## 2017-09-01 VITALS — BP 134/74 | HR 83 | Temp 98.8°F | Resp 16 | Wt 165.0 lb

## 2017-09-01 DIAGNOSIS — Z5112 Encounter for antineoplastic immunotherapy: Secondary | ICD-10-CM | POA: Insufficient documentation

## 2017-09-01 DIAGNOSIS — D649 Anemia, unspecified: Secondary | ICD-10-CM | POA: Insufficient documentation

## 2017-09-01 DIAGNOSIS — C50412 Malignant neoplasm of upper-outer quadrant of left female breast: Secondary | ICD-10-CM

## 2017-09-01 DIAGNOSIS — Z171 Estrogen receptor negative status [ER-]: Secondary | ICD-10-CM | POA: Diagnosis not present

## 2017-09-01 DIAGNOSIS — Z7982 Long term (current) use of aspirin: Secondary | ICD-10-CM | POA: Diagnosis not present

## 2017-09-01 DIAGNOSIS — N289 Disorder of kidney and ureter, unspecified: Secondary | ICD-10-CM | POA: Diagnosis not present

## 2017-09-01 DIAGNOSIS — Z79899 Other long term (current) drug therapy: Secondary | ICD-10-CM | POA: Insufficient documentation

## 2017-09-01 MED ORDER — DIPHENHYDRAMINE HCL 25 MG PO CAPS
25.0000 mg | ORAL_CAPSULE | Freq: Once | ORAL | Status: AC
  Administered 2017-09-01: 25 mg via ORAL
  Filled 2017-09-01: qty 1

## 2017-09-01 MED ORDER — ACETAMINOPHEN 325 MG PO TABS
650.0000 mg | ORAL_TABLET | Freq: Once | ORAL | Status: AC
  Administered 2017-09-01: 650 mg via ORAL
  Filled 2017-09-01: qty 2

## 2017-09-01 MED ORDER — SODIUM CHLORIDE 0.9 % IV SOLN
Freq: Once | INTRAVENOUS | Status: AC
  Administered 2017-09-01: 10:00:00 via INTRAVENOUS
  Filled 2017-09-01: qty 1000

## 2017-09-01 MED ORDER — TRASTUZUMAB CHEMO 150 MG IV SOLR
450.0000 mg | Freq: Once | INTRAVENOUS | Status: AC
  Administered 2017-09-01: 450 mg via INTRAVENOUS
  Filled 2017-09-01: qty 21.43

## 2017-09-01 MED ORDER — HEPARIN SOD (PORK) LOCK FLUSH 100 UNIT/ML IV SOLN
500.0000 [IU] | Freq: Once | INTRAVENOUS | Status: AC | PRN
  Administered 2017-09-01: 500 [IU]
  Filled 2017-09-01: qty 5

## 2017-09-04 NOTE — Progress Notes (Signed)
Closing out lab/order note open since:  2017

## 2017-09-04 NOTE — Progress Notes (Signed)
Closing out lab/order note open since:  09/2016

## 2017-09-04 NOTE — Progress Notes (Signed)
Closing out lab/order note open since:  March 2018 

## 2017-09-04 NOTE — Progress Notes (Signed)
Closing out lab/order note open since:  09/18/16

## 2017-09-18 NOTE — Progress Notes (Signed)
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  INTERVAL HISTORY: Patient returns to clinic today for further evaluation and consideration of cycle 16 of 18 of maintenance Herceptin. She currently feels well and is asymptomatic. She continues to be mildly anxious. She has no neurologic complaints. She denies any recent fevers or illnesses. She has a good appetite and denies weight loss. She has no chest pain or shortness of breath. She denies any nausea, vomiting, constipation, or diarrhea. She has no urinary complaints. Patient offers no specific complaints today.  REVIEW OF SYSTEMS:   Review of Systems  Constitutional: Negative.  Negative for fever, malaise/fatigue and weight loss.  Respiratory: Negative.  Negative for cough and shortness of breath.   Cardiovascular: Negative.  Negative for chest pain and leg swelling.  Gastrointestinal: Negative.  Negative for abdominal pain.  Genitourinary: Negative.   Musculoskeletal: Negative.   Skin: Negative.  Negative for rash.  Neurological: Negative.  Negative for sensory change and weakness.  Psychiatric/Behavioral: Negative for depression. The patient is nervous/anxious.     As per HPI. Otherwise, a complete review of systems is negative.  PAST MEDICAL HISTORY: Past Medical History:  Diagnosis Date  . Anemia   . Breast cancer ([place])   . Breast mass, left 09/22/2016   RECOMMENDATION: Ultrasound-guided core biopsies of masses at left breast 2 o'clock 2 cm from  nipple, left breast 2 o'clock 5 cm from nipple, abnormal left axillary lymph nodes.  Marland Kitchen GERD (gastroesophageal reflux disease)   . Hyperglycemia   . Hyperlipidemia   . Obesity   . Postmenopausal     PAST SURGICAL HISTORY: Past Surgical History:  Procedure Laterality Date  . ABDOMINAL HYSTERECTOMY    . AXILLARY SENTINEL NODE BIOPSY Left 04/14/2017   Procedure: AXILLARY SENTINEL NODE BIOPSY;  Surgeon: Olean Ree, MD;  Location: [facility] ORS;  Service: General;  Laterality: Left;  . BREAST BIOPSY Left 09/30/2016   US biopsy of 3 areas + chemo  . ESOPHAGOGASTRODUODENOSCOPY (EGD) WITH PROPOFOL N/A 12/24/2014   Procedure: ESOPHAGOGASTRODUODENOSCOPY (EGD) WITH PROPOFOL;  Surgeon: Manya Silvas, MD;  Location: [facility] ENDOSCOPY;  Service: Endoscopy;  Laterality: N/A;  . PORTACATH PLACEMENT Right 10/27/2016   Procedure: INSERTION PORT-A-CATH;  Surgeon: Nestor Lewandowsky, MD;  Location: [facility] ORS;  Service: General;  Laterality: Right;  . TONSILECTOMY, ADENOIDECTOMY, BILATERAL MYRINGOTOMY AND TUBES    . TONSILLECTOMY    . TOTAL MASTECTOMY Left 04/14/2017   Procedure: TOTAL MASTECTOMY;  Surgeon: Olean Ree, MD;  Location: [facility] ORS;  Service: General;  Laterality: Left;    FAMILY HISTORY: Family History  Problem Relation Age of Onset  . Stroke Mother   . Stroke Father   . Cancer Neg Hx   . Depression Neg Hx     ADVANCED DIRECTIVES (Y/N):  N  HEALTH MAINTENANCE: Social History   Tobacco Use  . Smoking status: Never Smoker  . Smokeless tobacco: Never Used  Substance Use Topics  . Alcohol use: No    Alcohol/week: 0.0 oz  . Drug use: No     Colonoscopy:  PAP:  Bone density:  Lipid panel:  Allergies  Allergen Reactions  . Latex Itching  . Penicillins Itching and Rash    Has patient had a PCN reaction causing immediate rash, facial/tongue/throat swelling, SOB or lightheadedness with hypotension: Yes Has patient had a PCN reaction causing severe rash involving mucus membranes or skin  necrosis: No Has patient had a PCN reaction that required hospitalization No Has patient had a PCN reaction occurring within the last 10 years: No If all of the above answers are "NO", then may proceed with Cephalosporin use.     Current Outpatient Medications  Medication Sig Dispense Refill  . silver sulfADIAZINE (SILVADENE) 1 % cream Apply 1 application topically 2 (two) times daily. 50 g 3  . acetaminophen (TYLENOL) 500 MG tablet Take 1,000 mg by mouth every 6 (six) hours as needed for mild pain.    Marland Kitchen aspirin 81 MG tablet Take 81 mg by mouth daily.    . Cholecalciferol (VITAMIN D) 2000 units tablet Take 2,000 Units by mouth 3 (three) times a week. One by mouth twice a week    . ferrous sulfate 325 (65 FE) MG EC tablet Take 325 mg by mouth 3 (three) times a week.     . fluticasone (FLONASE) 50 MCG/ACT nasal spray Place 1 spray into both nostrils daily as needed for allergies or rhinitis. (Patient not taking: Reported on 09/22/2017) 16 g 11  . Multiple Vitamin (MULTIVITAMIN WITH MINERALS) TABS tablet Take 1 tablet by mouth 3 (three) times a week.     Vladimir Faster Glycol-Propyl Glycol (SYSTANE OP) Place 1 drop into both eyes daily as needed (dry eyes).      No current facility-administered medications for this visit.    Facility-Administered Medications Ordered in Other Visits  Medication Dose Route Frequency Provider Last Rate Last Dose  . acetaminophen (TYLENOL) tablet 650 mg  650 mg Oral Once Lloyd Huger, MD      . diphenhydrAMINE (BENADRYL) capsule 25 mg  25 mg Oral Once Lloyd Huger, MD      . heparin lock flush 100 unit/mL  500 Units Intravenous Once Lloyd Huger, MD      . sodium chloride flush (NS) 0.9 % injection 10 mL  10 mL Intravenous PRN Lloyd Huger, MD   10 mL at 09/22/17 0848  . trastuzumab (HERCEPTIN) 450 mg in sodium chloride 0.9 % 250 mL chemo infusion  450 mg Intravenous Once Lloyd Huger, MD        OBJECTIVE: Vitals:   09/22/17 0921   BP: (!) 144/71  Pulse: 71  Resp: 18  Temp: (!) 96.8 F (36 C)     Body mass index is 31.99 kg/m.    ECOG FS:0 - Asymptomatic  General: Well-developed, well-nourished, no acute distress. Eyes: Pink conjunctiva, anicteric sclera. Breasts: Left mastectomy.  Exam deferred today. Chest wall: Port site without erythema or induration. Lungs: Clear to auscultation bilaterally. Heart: Regular rate and rhythm. No rubs, murmurs, or gallops. Abdomen: Soft, nontender, nondistended. No organomegaly noted, normoactive bowel sounds. Musculoskeletal: No edema, cyanosis, or clubbing. Neuro: Alert, answering all questions appropriately. Cranial nerves grossly intact. Skin: No rashes or petechiae noted. Psych: Normal affect.  LAB RESULTS:  Lab Results  Component Value Date   NA 141 09/22/2017   K 4.0 09/22/2017   CL 113 (H) 09/22/2017   CO2 21 (L) 09/22/2017   GLUCOSE 89 09/22/2017   BUN 25 (H) 09/22/2017   CREATININE 1.24 (H) 09/22/2017   CALCIUM 9.4 09/22/2017   PROT 6.8  09/22/2017   ALBUMIN 3.6 09/22/2017   AST 25 09/22/2017   ALT 25 09/22/2017   ALKPHOS 107 09/22/2017   BILITOT 0.5 09/22/2017   GFRNONAA 41 (L) 09/22/2017   GFRAA 47 (L) 09/22/2017    Lab Results  Component Value Date   WBC 4.0 09/22/2017   NEUTROABS 2.3 09/22/2017   HGB 10.6 (L) 09/22/2017   HCT 31.2 (L) 09/22/2017   MCV 93.2 09/22/2017   PLT 181 09/22/2017     STUDIES: No results found.  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  PLAN:    1. 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 She completed neoadjuvant Taxotere, carboplatinum, Herceptin, and Perjeta on February 17, 2017. She had a total mastectomy on April 14, 2017, therefore did not require adjuvant XRT.  Continue with maintenance Herceptin every 3 weeks for a total of 18 infusions.  An aromatase inhibitor would not offer benefit given the ER/PR negativity of her disease. Repeat MUGA scan on July 23, 2017 revealed an EF of 61% which is mildly improved from previous, repeat in April 2019. Proceed with cycle 16 of Herceptin today. Return to clinic in 3 weeks for consideration of cycle 17 and then in 6 weeks for further evaluation and consideration of cycle 18.   2. Anemia: Mild, monitor. 3. Renal insufficiency: Creatinine is only mildly elevated today, monitor. 4. Hypertension: Blood pressure is mildly today, monitor.  Patient expressed understanding and was in agreement with this plan. She also understands that She can call clinic at any time with any questions, concerns, or complaints.   Cancer Staging Malignant neoplasm of upper-outer quadrant of left female breast [facility]) Staging form: Breast, AJCC 8th Edition - Clinical stage from 10/11/2016: Stage IIB (cT2, cN1, cM0, G3, ER: Negative, PR: Negative, HER2: Positive) - Signed by Lloyd Huger, MD on 10/11/2016 - Pathologic stage from 04/23/2017: No Stage Recommended (ypT0, pN0, cM0, G3, ER: Negative, PR: Negative, HER2: Positive) - Signed by Lloyd Huger, MD on 04/23/2017   Lloyd Huger, MD   09/22/2017 10:00 AM

## 2017-09-20 ENCOUNTER — Ambulatory Visit (INDEPENDENT_AMBULATORY_CARE_PROVIDER_SITE_OTHER): Payer: Medicare Other | Admitting: Family Medicine

## 2017-09-20 ENCOUNTER — Encounter: Payer: Self-pay | Admitting: Family Medicine

## 2017-09-20 VITALS — BP 128/70 | HR 86 | Temp 98.2°F | Wt 169.1 lb

## 2017-09-20 DIAGNOSIS — G4700 Insomnia, unspecified: Secondary | ICD-10-CM

## 2017-09-20 DIAGNOSIS — J3089 Other allergic rhinitis: Secondary | ICD-10-CM

## 2017-09-20 DIAGNOSIS — R739 Hyperglycemia, unspecified: Secondary | ICD-10-CM | POA: Diagnosis not present

## 2017-09-20 DIAGNOSIS — C50412 Malignant neoplasm of upper-outer quadrant of left female breast: Secondary | ICD-10-CM

## 2017-09-20 DIAGNOSIS — R300 Dysuria: Secondary | ICD-10-CM

## 2017-09-20 DIAGNOSIS — Z171 Estrogen receptor negative status [ER-]: Secondary | ICD-10-CM

## 2017-09-20 LAB — POCT URINALYSIS DIPSTICK
APPEARANCE: NORMAL
BILIRUBIN UA: NEGATIVE
Glucose, UA: NEGATIVE
KETONES UA: NEGATIVE
Leukocytes, UA: NEGATIVE
Nitrite, UA: NEGATIVE
PH UA: 5 (ref 5.0–8.0)
Protein, UA: NEGATIVE
RBC UA: NEGATIVE
Spec Grav, UA: 1.02 (ref 1.010–1.025)
UROBILINOGEN UA: 0.2 U/dL

## 2017-09-20 LAB — POCT GLYCOSYLATED HEMOGLOBIN (HGB A1C): Hemoglobin A1C: 5.6

## 2017-09-20 MED ORDER — FLUTICASONE PROPIONATE 50 MCG/ACT NA SUSP: 1.0000 | Freq: Every day | NASAL | 11 refills | Status: DC | PRN

## 2017-09-20 NOTE — Progress Notes (Signed)
BP 128/70 (BP Location: Right Arm, Patient Position: Sitting, Cuff Size: Large)   Pulse 86   Temp 98.2 F (36.8 C) (Oral)   Wt 169 lb 1.6 oz (76.7 kg)   SpO2 98%   BMI 31.95 kg/m    Subjective:    Patient ID: Maria Patterson, female    DOB: 09/19/1940, 77 y.o.   MRN: [id number]  HPI: Maria Patterson is a 77 y.o. female  Chief Complaint  Patient presents with  . Follow-up    Pt states that she was told last time she had an infection cannot remember what exactly was said     HPI Patient is concerned about her glucose; she had her glucose checked in January from Dr. Grayland Ormond It was 55 in January, but it was 101 prior to that  She wants to get blood work today; asked about the CBC results; WBC went from 12.1k to 3.8k She feels like something is wrong down below; "maybe it's just her imagination"; can be painful at times; feels like something may be something wrong; no pain with urination; drinks sodas sometimes; when she drinks coffee, it hurts when drinks sodas too; hurts when finished urinating; sometimes she hurts down below  Asked about nasal spray; she has it on her med list; she is having congestion; med list says that it was a historical provider  Patient asked about something for her nerves; trouble sleeping; going on for a long time; not getting worse or changing; drinks caffeine, one cup in the morning; might drink one soda every two days (12 ounces); sometimes eats chocolate, not sure if that affects her sleep; drinks iced tea  Breast cancer; radiation treatment; Dr. Baruch Gouty gave her Rx for the radiation burn  Depression screen [facility] 2/9 09/23/2017 09/20/2017 08/09/2017 06/03/2017 05/04/2017  Decreased Interest 0 0 0 0 0  Down, Depressed, Hopeless 0 0 0 0 0  PHQ - 2 Score 0 0 0 0 0    Relevant past medical, surgical, family and social history reviewed Past Medical History:  Diagnosis Date  . Anemia   . Breast cancer ([place])   . Breast mass, left 09/22/2016   RECOMMENDATION:  Ultrasound-guided core biopsies of masses at left breast 2 o'clock 2 cm from nipple, left breast 2 o'clock 5 cm from nipple, abnormal left axillary lymph nodes.  Marland Kitchen GERD (gastroesophageal reflux disease)   . Hyperglycemia   . Hyperlipidemia   . Obesity   . Postmenopausal    Past Surgical History:  Procedure Laterality Date  . ABDOMINAL HYSTERECTOMY    . AXILLARY SENTINEL NODE BIOPSY Left 04/14/2017   Procedure: AXILLARY SENTINEL NODE BIOPSY;  Surgeon: Olean Ree, MD;  Location: [facility] ORS;  Service: General;  Laterality: Left;  . BREAST BIOPSY Left 09/30/2016   US biopsy of 3 areas + chemo  . BREAST EXCISIONAL BIOPSY Left 04/14/2017   Left breast invasive cancer left mastectomy  . ESOPHAGOGASTRODUODENOSCOPY (EGD) WITH PROPOFOL N/A 12/24/2014   Procedure: ESOPHAGOGASTRODUODENOSCOPY (EGD) WITH PROPOFOL;  Surgeon: Manya Silvas, MD;  Location: [facility] ENDOSCOPY;  Service: Endoscopy;  Laterality: N/A;  . MASTECTOMY Left 04/14/2017   Left breast invasive cancer  . PORTACATH PLACEMENT Right 10/27/2016   Procedure: INSERTION PORT-A-CATH;  Surgeon: Nestor Lewandowsky, MD;  Location: [facility] ORS;  Service: General;  Laterality: Right;  . TONSILECTOMY, ADENOIDECTOMY, BILATERAL MYRINGOTOMY AND TUBES    . TONSILLECTOMY    . TOTAL MASTECTOMY Left 04/14/2017   Procedure: TOTAL MASTECTOMY;  Surgeon: Olean Ree, MD;  Location[facility]  ORS;  Service: General;  Laterality: Left;   Family History  Problem Relation Age of Onset  . Stroke Mother   . Stroke Father   . Cancer Neg Hx   . Depression Neg Hx    Social History   Tobacco Use  . Smoking status: Never Smoker  . Smokeless tobacco: Never Used  Substance Use Topics  . Alcohol use: No    Alcohol/week: 0.0 oz  . Drug use: No    Interim medical history since last visit reviewed. Allergies and medications reviewed  Review of Systems Per HPI unless specifically indicated above     Objective:    BP 128/70 (BP Location: Right Arm, Patient Position:  Sitting, Cuff Size: Large)   Pulse 86   Temp 98.2 F (36.8 C) (Oral)   Wt 169 lb 1.6 oz (76.7 kg)   SpO2 98%   BMI 31.95 kg/m   Wt Readings from Last 3 Encounters:  09/23/17 171 lb 1.2 oz (77.6 kg)  09/22/17 169 lb 4.8 oz (76.8 kg)  09/20/17 169 lb 1.6 oz (76.7 kg)    Physical Exam  Constitutional: She appears well-developed and well-nourished. No distress.  HENT:  Head: Normocephalic and atraumatic.  Eyes: No scleral icterus.  Cardiovascular: Normal rate and regular rhythm.  Pulmonary/Chest: Effort normal and breath sounds normal. She has no wheezes.  Abdominal: Soft. Bowel sounds are normal. She exhibits no distension.  Genitourinary:  Genitourinary Comments: Patient declined GYN / pelvic exam  Musculoskeletal: She exhibits no edema.  Skin: No pallor.  Psychiatric: She has a normal mood and affect.       Assessment & Plan:   Problem List Items Addressed This Visit      Respiratory   Allergic rhinitis    Refills provided for nasal steroid        Other   Insomnia    Patient asked for something for "nerves" and "trouble sleeping"; it is not appropriate to prescribe a sedative hypnotic or benzodiazepine to this patient at her age; lifestyle changes discussed, with good sleep hygiene, avoidance of methylxanthines for 10 hours prior to onset of sleep      Hyperglycemia    Check A1c at her request; normal range      Relevant Orders   POCT HgB A1C (Completed)   Breast cancer, left [facility])    Undergoing radiation treatment       Other Visit Diagnoses    Dysuria    -  Primary   Relevant Orders   POCT Urinalysis Dipstick (Completed)       Follow up plan: Return in about 4 weeks (around 10/18/2017) for Medicare Wellness check.  An after-visit summary was printed and given to the patient at [place].  Please see the patient instructions which may contain other information and recommendations beyond what is mentioned above in the assessment and plan.  Meds ordered  this encounter  Medications  . fluticasone (FLONASE) 50 MCG/ACT nasal spray    Sig: Place 1 spray into both nostrils daily as needed for allergies or rhinitis.    Dispense:  16 g    Refill:  11    Orders Placed This Encounter  Procedures  . POCT Urinalysis Dipstick  . POCT HgB A1C

## 2017-09-20 NOTE — Progress Notes (Signed)
Patient: Maria Patterson, Female    DOB: [birth year], 77 y.o.   MRN: [id number]   Subjective:     Review of Systems    Objective:   Physical Exam   Assessment & Plan:

## 2017-09-20 NOTE — Patient Instructions (Addendum)
Return for a Medicare Wellness visit in the next month  [address] Ways to [place]  ?Anxiety is normal human sensation. It is what helped our ancestors survive the pitfalls of the wilderness. Anxiety is defined as experiencing worry or nervousness about an imminent event or something with an uncertain outcome. It is a feeling experienced by most people at some point in their lives. Anxiety can be triggered by a very personal issue, such as the illness of a loved one, or an event of global proportions, such as a refugee crisis. Some of the symptoms of anxiety are:  Feeling restless.  Having a feeling of impending danger.  Increased heart rate.  Rapid breathing. Sweating.  Shaking.  Weakness or feeling tired.  Difficulty concentrating on anything except the current worry.  Insomnia.  Stomach or bowel problems. What can we do about anxiety we may be feeling? There are many techniques to help manage stress and relax. Here are 12 ways you can reduce your anxiety almost immediately: 1. Turn off the constant feed of information. Take a social media sabbatical. Studies have shown that social media directly contributes to social anxiety.  2. Monitor your television viewing habits. Are you watching shows that are also contributing to your anxiety, such as 24-hour news stations? Try watching something else, or better yet, nothing at all. Instead, listen to music, read an inspirational book or practice a hobby. 3. Eat nutritious meals. Also, don't skip meals and keep healthful snacks on hand. Hunger and poor diet contributes to feeling anxious. 4. Sleep. Sleeping on a regular schedule for at least seven to eight hours a night will do wonders for your outlook when you are awake. 5. Exercise. Regular exercise will help rid your body of that anxious energy and help you get more restful sleep. 6. Try deep (diaphragmatic) breathing. Inhale slowly through your nose for five seconds and exhale through your  mouth. 7. Practice acceptance and gratitude. When anxiety hits, accept that there are things out of your control that shouldn't be of immediate concern.  8. Seek out humor. When anxiety strikes, watch a funny video, read jokes or call a friend who makes you laugh. Laughter is healing for our bodies and releases endorphins that are calming. 9. Stay positive. Take the effort to replace negative thoughts with positive ones. Try to see a stressful situation in a positive light. Try to come up with solutions rather than dwelling on the problem. 10. Figure out what triggers your anxiety. Keep a journal and make note of anxious moments and the events surrounding them. This will help you identify triggers you can avoid or even eliminate. 11. Talk to someone. Let a trusted friend, family member or even trained professional know that you are feeling overwhelmed and anxious. Verbalize what you are feeling and why.  12. Volunteer. If your anxiety is triggered by a crisis on a large scale, become an advocate and work to resolve the problem that is causing you unease. Anxiety is often unwelcome and can become overwhelming. If not kept in check, it can become a disorder that could require medical treatment. However, if you take the time to care for yourself and avoid the triggers that make you anxious, you will be able to find moments of relaxation and clarity that make your life much more enjoyable.  Insomnia Insomnia is a sleep disorder that makes it difficult to fall asleep or to stay asleep. Insomnia can cause tiredness (fatigue), low energy, difficulty concentrating, mood swings,  and poor performance at work or school. There are three different ways to classify insomnia:  Difficulty falling asleep.  Difficulty staying asleep.  Waking up too early in the morning.  Any type of insomnia can be long-term (chronic) or short-term (acute). Both are common. Short-term insomnia usually lasts for three months or  less. Chronic insomnia occurs at least three times a week for longer than three months. What are the causes? Insomnia may be caused by another condition, situation, or substance, such as:  Anxiety.  Certain medicines.  Gastroesophageal reflux disease (GERD) or other gastrointestinal conditions.  Asthma or other breathing conditions.  Restless legs syndrome, sleep apnea, or other sleep disorders.  Chronic pain.  Menopause. This may include hot flashes.  Stroke.  Abuse of alcohol, tobacco, or illegal drugs.  Depression.  Caffeine.  Neurological disorders, such as Alzheimer disease.  An overactive thyroid (hyperthyroidism).  The cause of insomnia may not be known. What increases the risk? Risk factors for insomnia include:  Gender. Women are more commonly affected than men.  Age. Insomnia is more common as you get older.  Stress. This may involve your professional or personal life.  Income. Insomnia is more common in people with lower income.  Lack of exercise.  Irregular work schedule or night shifts.  Traveling between different time zones.  What are the signs or symptoms? If you have insomnia, trouble falling asleep or trouble staying asleep is the main symptom. This may lead to other symptoms, such as:  Feeling fatigued.  Feeling nervous about going to sleep.  Not feeling rested in the morning.  Having trouble concentrating.  Feeling irritable, anxious, or depressed.  How is this treated? Treatment for insomnia depends on the cause. If your insomnia is caused by an underlying condition, treatment will focus on addressing the condition. Treatment may also include:  Medicines to help you sleep.  Counseling or therapy.  Lifestyle adjustments.  Follow these instructions at home:  Take medicines only as directed by your health care provider.  Keep regular sleeping and waking hours. Avoid naps.  Keep a sleep diary to help you and your health care  provider figure out what could be causing your insomnia. Include: ? When you sleep. ? When you wake up during the night. ? How well you sleep. ? How rested you feel the next day. ? Any side effects of medicines you are taking. ? What you eat and drink.  Make your bedroom a comfortable place where it is easy to fall asleep: ? Put up shades or special blackout curtains to block light from outside. ? Use a white noise machine to block noise. ? Keep the temperature cool.  Exercise regularly as directed by your health care provider. Avoid exercising right before bedtime.  Use relaxation techniques to manage stress. Ask your health care provider to suggest some techniques that may work well for you. These may include: ? Breathing exercises. ? Routines to release muscle tension. ? Visualizing peaceful scenes.  Cut back on alcohol, caffeinated beverages, and cigarettes, especially close to bedtime. These can disrupt your sleep.  Do not overeat or eat spicy foods right before bedtime. This can lead to digestive discomfort that can make it hard for you to sleep.  Limit screen use before bedtime. This includes: ? Watching TV. ? Using your smartphone, tablet, and computer.  Stick to a routine. This can help you fall asleep faster. Try to do a quiet activity, brush your teeth, and go to bed at  the same time each night.  Get out of bed if you are still awake after 15 minutes of trying to sleep. Keep the lights down, but try reading or doing a quiet activity. When you feel sleepy, go back to bed.  Make sure that you drive carefully. Avoid driving if you feel very sleepy.  Keep all follow-up appointments as directed by your health care provider. This is important. Contact a health care provider if:  You are tired throughout the day or have trouble in your daily routine due to sleepiness.  You continue to have sleep problems or your sleep problems get worse. Get help right away if:  You have  serious thoughts about hurting yourself or someone else. This information is not intended to replace advice given to you by your health care provider. Make sure you discuss any questions you have with your health care provider. Document Released: 07/03/2000 Document Revised: 12/06/2015 Document Reviewed: 04/06/2014 Elsevier Interactive Patient Education  [employer].

## 2017-09-22 ENCOUNTER — Inpatient Hospital Stay (HOSPITAL_BASED_OUTPATIENT_CLINIC_OR_DEPARTMENT_OTHER): Payer: Medicare Other | Admitting: Oncology

## 2017-09-22 ENCOUNTER — Inpatient Hospital Stay: Payer: Medicare Other

## 2017-09-22 ENCOUNTER — Other Ambulatory Visit: Payer: Self-pay

## 2017-09-22 ENCOUNTER — Inpatient Hospital Stay: Payer: Medicare Other | Attending: Oncology

## 2017-09-22 VITALS — BP 144/71 | HR 71 | Temp 96.8°F | Resp 18 | Wt 169.3 lb

## 2017-09-22 DIAGNOSIS — F419 Anxiety disorder, unspecified: Secondary | ICD-10-CM | POA: Diagnosis not present

## 2017-09-22 DIAGNOSIS — Z5112 Encounter for antineoplastic immunotherapy: Secondary | ICD-10-CM | POA: Insufficient documentation

## 2017-09-22 DIAGNOSIS — I10 Essential (primary) hypertension: Secondary | ICD-10-CM

## 2017-09-22 DIAGNOSIS — N289 Disorder of kidney and ureter, unspecified: Secondary | ICD-10-CM | POA: Diagnosis not present

## 2017-09-22 DIAGNOSIS — Z171 Estrogen receptor negative status [ER-]: Secondary | ICD-10-CM

## 2017-09-22 DIAGNOSIS — C50412 Malignant neoplasm of upper-outer quadrant of left female breast: Secondary | ICD-10-CM | POA: Diagnosis not present

## 2017-09-22 DIAGNOSIS — D649 Anemia, unspecified: Secondary | ICD-10-CM

## 2017-09-22 DIAGNOSIS — Z79899 Other long term (current) drug therapy: Secondary | ICD-10-CM | POA: Insufficient documentation

## 2017-09-22 DIAGNOSIS — Z7982 Long term (current) use of aspirin: Secondary | ICD-10-CM | POA: Diagnosis not present

## 2017-09-22 DIAGNOSIS — C50919 Malignant neoplasm of unspecified site of unspecified female breast: Secondary | ICD-10-CM

## 2017-09-22 LAB — CBC WITH DIFFERENTIAL/PLATELET
BASOS PCT: 1 %
Basophils Absolute: 0 10*3/uL (ref 0–0.1)
EOS ABS: 0.1 10*3/uL (ref 0–0.7)
EOS PCT: 3 %
HCT: 31.2 % — ABNORMAL LOW (ref 35.0–47.0)
HEMOGLOBIN: 10.6 g/dL — AB (ref 12.0–16.0)
LYMPHS ABS: 1.3 10*3/uL (ref 1.0–3.6)
Lymphocytes Relative: 32 %
MCH: 31.7 pg (ref 26.0–34.0)
MCHC: 34 g/dL (ref 32.0–36.0)
MCV: 93.2 fL (ref 80.0–100.0)
MONO ABS: 0.3 10*3/uL (ref 0.2–0.9)
MONOS PCT: 8 %
Neutro Abs: 2.3 10*3/uL (ref 1.4–6.5)
Neutrophils Relative %: 56 %
Platelets: 181 10*3/uL (ref 150–440)
RBC: 3.34 MIL/uL — ABNORMAL LOW (ref 3.80–5.20)
RDW: 15.8 % — ABNORMAL HIGH (ref 11.5–14.5)
WBC: 4 10*3/uL (ref 3.6–11.0)

## 2017-09-22 LAB — COMPREHENSIVE METABOLIC PANEL
ALT: 25 U/L (ref 14–54)
ANION GAP: 7 (ref 5–15)
AST: 25 U/L (ref 15–41)
Albumin: 3.6 g/dL (ref 3.5–5.0)
Alkaline Phosphatase: 107 U/L (ref 38–126)
BUN: 25 mg/dL — ABNORMAL HIGH (ref 6–20)
CHLORIDE: 113 mmol/L — AB (ref 101–111)
CO2: 21 mmol/L — AB (ref 22–32)
Calcium: 9.4 mg/dL (ref 8.9–10.3)
Creatinine, Ser: 1.24 mg/dL — ABNORMAL HIGH (ref 0.44–1.00)
GFR calc non Af Amer: 41 mL/min — ABNORMAL LOW (ref >60)
GFR, EST AFRICAN AMERICAN: 47 mL/min — AB (ref >60)
Glucose, Bld: 89 mg/dL (ref 65–99)
POTASSIUM: 4 mmol/L (ref 3.5–5.1)
SODIUM: 141 mmol/L (ref 135–145)
Total Bilirubin: 0.5 mg/dL (ref 0.3–1.2)
Total Protein: 6.8 g/dL (ref 6.5–8.1)

## 2017-09-22 MED ORDER — SODIUM CHLORIDE 0.9 % IV SOLN
450.0000 mg | Freq: Once | INTRAVENOUS | Status: AC
  Administered 2017-09-22: 450 mg via INTRAVENOUS
  Filled 2017-09-22: qty 21.43

## 2017-09-22 MED ORDER — HEPARIN SOD (PORK) LOCK FLUSH 100 UNIT/ML IV SOLN
500.0000 [IU] | Freq: Once | INTRAVENOUS | Status: AC
  Administered 2017-09-22: 500 [IU] via INTRAVENOUS
  Filled 2017-09-22: qty 5

## 2017-09-22 MED ORDER — SODIUM CHLORIDE 0.9 % IV SOLN
Freq: Once | INTRAVENOUS | Status: AC
  Administered 2017-09-22: 10:00:00 via INTRAVENOUS
  Filled 2017-09-22: qty 1000

## 2017-09-22 MED ORDER — SODIUM CHLORIDE 0.9% FLUSH
10.0000 mL | INTRAVENOUS | Status: DC | PRN
  Administered 2017-09-22: 10 mL via INTRAVENOUS
  Filled 2017-09-22: qty 10

## 2017-09-22 MED ORDER — DIPHENHYDRAMINE HCL 25 MG PO CAPS
25.0000 mg | ORAL_CAPSULE | Freq: Once | ORAL | Status: AC
  Administered 2017-09-22: 25 mg via ORAL
  Filled 2017-09-22: qty 1

## 2017-09-22 MED ORDER — ACETAMINOPHEN 325 MG PO TABS
650.0000 mg | ORAL_TABLET | Freq: Once | ORAL | Status: AC
  Administered 2017-09-22: 650 mg via ORAL
  Filled 2017-09-22: qty 2

## 2017-09-22 NOTE — Progress Notes (Signed)
Here for follow up stated " im doing fine"  Some diff staying asleep through the night per pt.

## 2017-09-23 ENCOUNTER — Encounter: Payer: Self-pay | Admitting: Radiation Oncology

## 2017-09-23 ENCOUNTER — Ambulatory Visit
Admission: RE | Admit: 2017-09-23 | Discharge: 2017-09-23 | Disposition: A | Discharge status: Home / Self Care | Payer: Medicare Other | Source: Ambulatory Visit | Attending: Radiation Oncology | Admitting: Radiation Oncology

## 2017-09-23 ENCOUNTER — Other Ambulatory Visit: Payer: Self-pay

## 2017-09-23 VITALS — BP 155/75 | HR 92 | Temp 98.0°F | Resp 20 | Wt 171.1 lb

## 2017-09-23 DIAGNOSIS — Z171 Estrogen receptor negative status [ER-]: Secondary | ICD-10-CM

## 2017-09-23 DIAGNOSIS — Z923 Personal history of irradiation: Secondary | ICD-10-CM | POA: Diagnosis not present

## 2017-09-23 DIAGNOSIS — Z79899 Other long term (current) drug therapy: Secondary | ICD-10-CM | POA: Insufficient documentation

## 2017-09-23 DIAGNOSIS — Z9012 Acquired absence of left breast and nipple: Secondary | ICD-10-CM | POA: Insufficient documentation

## 2017-09-23 DIAGNOSIS — Z853 Personal history of malignant neoplasm of breast: Secondary | ICD-10-CM | POA: Insufficient documentation

## 2017-09-23 DIAGNOSIS — C50412 Malignant neoplasm of upper-outer quadrant of left female breast: Secondary | ICD-10-CM

## 2017-09-23 NOTE — Progress Notes (Signed)
Radiation Oncology Follow up Note  Name: Maria Patterson   Date:   09/23/2017 MRN:  [id number] DOB: 1940/09/07    This 77 y.o. 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  REFERRING PROVIDER: Arnetha Courser, MD  HPI: Patient is a 78 year old female now seen out 1 month having completed left chest wall peripheral lymphatic radiation. She underwent neoadjuvant chemotherapy for stage IIB (T2 N1 M0) ER/PR negative HER-2/neu overexpressed invasive mammary carcinoma. She that had a left modified radical mastectomy. She is now seen 1 month out having completed radiation therapy to her left chest wall peripheral lymphatics. She is doing well. She states she still not been the chest which we would expect. She's having no swelling of her left upper extremity. She specifically denies any new nodularity the chest wall cough or bone pain.. Patient is on maintenance Herceptin  COMPLICATIONS OF TREATMENT: none  FOLLOW UP COMPLIANCE: keeps appointments   PHYSICAL EXAM:  BP (!) 155/75   Pulse 92   Temp 98 F (36.7 C)   Resp 20   Wt 171 lb 1.2 oz (77.6 kg)   BMI 32.32 kg/m  Patient is status post left modified radical mastectomy there still some hyperpigmentation of the chest wall from radiation. No evidence of mass or nodularity is noted on the chest wall right breast is free of dominant mass or nodularity in 2 positions examined. No axillary or supraclavicular adenopathy is identified. No lymphedema of the left upper extremity is noted. Well-developed well-nourished patient in NAD. HEENT reveals PERLA, EOMI, discs not visualized.  Oral cavity is clear. No oral mucosal lesions are identified. Neck is clear without evidence of cervical or supraclavicular adenopathy. Lungs are clear to A&P. Cardiac examination is essentially  unremarkable with regular rate and rhythm without murmur rub or thrill. Abdomen is benign with no organomegaly or masses noted. Motor sensory and DTR levels are equal and symmetric in the upper and lower extremities. Cranial nerves II through XII are grossly intact. Proprioception is intact. No peripheral adenopathy or edema is identified. No motor or sensory levels are noted. Crude visual fields are within normal range.  RADIOLOGY RESULTS: No current films for review   PLAN: Present time patient is doing well 1 month out from radiation therapy. She continues on maintenance Herceptin. I am please were overall progress. I've asked to see her back in 4-5 months for follow-up. Patient knows to call with any concerns. She continues close follow-up care with medical oncology.  I would like to take this opportunity to thank you for allowing me to participate in the care of your patient.Noreene Filbert, MD

## 2017-09-24 ENCOUNTER — Ambulatory Visit
Admission: RE | Admit: 2017-09-24 | Discharge: 2017-09-24 | Disposition: A | Discharge status: Home / Self Care | Payer: Medicare Other | Source: Ambulatory Visit | Attending: Surgery | Admitting: Surgery

## 2017-09-24 DIAGNOSIS — Z9012 Acquired absence of left breast and nipple: Secondary | ICD-10-CM

## 2017-09-24 DIAGNOSIS — Z1231 Encounter for screening mammogram for malignant neoplasm of breast: Secondary | ICD-10-CM | POA: Insufficient documentation

## 2017-09-26 DIAGNOSIS — G4700 Insomnia, unspecified: Secondary | ICD-10-CM

## 2017-09-26 HISTORY — DX: Insomnia, unspecified: G47.00

## 2017-09-26 NOTE — Assessment & Plan Note (Signed)
Patient asked for something for "nerves" and "trouble sleeping"; it is not appropriate to prescribe a sedative hypnotic or benzodiazepine to this patient at her age; lifestyle changes discussed, with good sleep hygiene, avoidance of methylxanthines for 10 hours prior to onset of sleep

## 2017-09-26 NOTE — Assessment & Plan Note (Signed)
Check A1c at her request; normal range

## 2017-09-26 NOTE — Assessment & Plan Note (Signed)
Refills provided for nasal steroid

## 2017-09-26 NOTE — Assessment & Plan Note (Signed)
Undergoing radiation treatment

## 2017-10-04 ENCOUNTER — Encounter: Payer: Self-pay | Admitting: Surgery

## 2017-10-04 ENCOUNTER — Ambulatory Visit (INDEPENDENT_AMBULATORY_CARE_PROVIDER_SITE_OTHER): Payer: Medicare Other | Admitting: Surgery

## 2017-10-04 VITALS — BP 158/66 | HR 74 | Temp 98.0°F | Ht 61.0 in | Wt 169.6 lb

## 2017-10-04 DIAGNOSIS — C50412 Malignant neoplasm of upper-outer quadrant of left female breast: Secondary | ICD-10-CM

## 2017-10-04 DIAGNOSIS — Z171 Estrogen receptor negative status [ER-]: Secondary | ICD-10-CM

## 2017-10-04 NOTE — Patient Instructions (Signed)
We do not currently have the schedule template for September as of yet. I will call you and schedule a follow up appointment for September once we have the template.

## 2017-10-04 NOTE — Progress Notes (Signed)
10/04/2017  History of Present Illness: Maria Patterson is a 77 y.o. female s/p left mastectomy with sentinel node biopsy on 03/2017 for left breast cancer.  She had undergone neoadjuvant chemo with complete pathologic response.  She is currently on Herceptin and also had radiation to the axilla and scar with Dr. Baruch Gouty.  She presents for follow up.  Had a mammogram on 3/8 which was negative.  She reports some discomfort over the mastectomy site when pressing on it, but she does not know how to exactly describe it.  Denies any worsening pain, swelling, drainage, fevers, chills.  Past Medical History: Past Medical History:  Diagnosis Date  . Anemia   . Breast cancer ([place])   . Breast mass, left 09/22/2016   RECOMMENDATION: Ultrasound-guided core biopsies of masses at left breast 2 o'clock 2 cm from nipple, left breast 2 o'clock 5 cm from nipple, abnormal left axillary lymph nodes.  Marland Kitchen GERD (gastroesophageal reflux disease)   . Hyperglycemia   . Hyperlipidemia   . Obesity   . Postmenopausal      Past Surgical History: Past Surgical History:  Procedure Laterality Date  . ABDOMINAL HYSTERECTOMY    . AXILLARY SENTINEL NODE BIOPSY Left 04/14/2017   Procedure: AXILLARY SENTINEL NODE BIOPSY;  Surgeon: Olean Ree, MD;  Location: [facility] ORS;  Service: General;  Laterality: Left;  . BREAST BIOPSY Left 09/30/2016   US biopsy of 3 areas + chemo  . BREAST EXCISIONAL BIOPSY Left 04/14/2017   Left breast invasive cancer left mastectomy  . ESOPHAGOGASTRODUODENOSCOPY (EGD) WITH PROPOFOL N/A 12/24/2014   Procedure: ESOPHAGOGASTRODUODENOSCOPY (EGD) WITH PROPOFOL;  Surgeon: Manya Silvas, MD;  Location: [facility];  Service: Endoscopy;  Laterality: N/A;  . MASTECTOMY Left 04/14/2017   Left breast invasive cancer  . PORTACATH PLACEMENT Right 10/27/2016   Procedure: INSERTION PORT-A-CATH;  Surgeon: Nestor Lewandowsky, MD;  Location: [facility] ORS;  Service: General;  Laterality: Right;  . TONSILECTOMY,  ADENOIDECTOMY, BILATERAL MYRINGOTOMY AND TUBES    . TONSILLECTOMY    . TOTAL MASTECTOMY Left 04/14/2017   Procedure: TOTAL MASTECTOMY;  Surgeon: Olean Ree, MD;  Location: [facility] ORS;  Service: General;  Laterality: Left;    Home Medications: Prior to Admission medications   Medication Sig Start Date End Date Taking? Authorizing Provider  acetaminophen (TYLENOL) 500 MG tablet Take 1,000 mg by mouth every 6 (six) hours as needed for mild pain.    [provider]  aspirin 81 MG tablet Take 81 mg by mouth daily.    [provider]  Cholecalciferol (VITAMIN D) 2000 units tablet Take 2,000 Units by mouth 3 (three) times a week. One by mouth twice a week 09/01/16   Arnetha Courser, MD  ferrous sulfate 325 (65 FE) MG EC tablet Take 325 mg by mouth 3 (three) times a week.  09/01/16   Arnetha Courser, MD  fluticasone (FLONASE) 50 MCG/ACT nasal spray Place 1 spray into both nostrils daily as needed for allergies or rhinitis. Patient not taking: Reported on 09/22/2017 09/20/17   Arnetha Courser, MD  Multiple Vitamin (MULTIVITAMIN WITH MINERALS) TABS tablet Take 1 tablet by mouth 3 (three) times a week.  09/01/16   Arnetha Courser, MD  Polyethyl Glycol-Propyl Glycol (SYSTANE OP) Place 1 drop into both eyes daily as needed (dry eyes).     [provider]  silver sulfADIAZINE (SILVADENE) 1 % cream Apply 1 application topically 2 (two) times daily. Patient not taking: Reported on 09/23/2017 08/03/17   Noreene Filbert, MD  Allergies: Allergies  Allergen Reactions  . Latex Itching  . Penicillins Itching and Rash    Has patient had a PCN reaction causing immediate rash, facial/tongue/throat swelling, SOB or lightheadedness with hypotension: Yes Has patient had a PCN reaction causing severe rash involving mucus membranes or skin necrosis: No Has patient had a PCN reaction that required hospitalization No Has patient had a PCN reaction occurring within the last 10 years: No If all of the  above answers are "NO", then may proceed with Cephalosporin use.    Review of Systems: Review of Systems  Constitutional: Negative for chills and fever.  Respiratory: Negative for shortness of breath.   Cardiovascular: Negative for chest pain.  Gastrointestinal: Negative for nausea and vomiting.    Physical Exam BP (!) 158/66   Pulse 74   Temp 98 F (36.7 C) (Oral)   Ht 5\' 1"  (1.549 m)   Wt 76.9 kg (169 lb 9.6 oz)   BMI 32.05 kg/m  CONSTITUTIONAL: No acute distress RESPIRATORY:  Lungs are clear, and breath sounds are equal bilaterally. Normal respiratory effort without pathologic use of accessory muscles. CARDIOVASCULAR: Heart is regular without murmurs, gallops, or rubs. BREAST:  Left breast s/p mastectomy.  Incision has healed well with some evidence of radiation changes to the medial portion.  Otherwise no palpable masses, and no axillary lymphadenopathy.  On right side, no palpable masses or lymph nodes GI: The abdomen is soft, nondistended, nontender.  MUSCULOSKELETAL:  Normal muscle strength and tone in all four extremities.  No peripheral edema or cyanosis. PSYCH:  Alert and oriented to person, place and time. Affect is normal.  Labs/Imaging: Mammogram 3/8: IMPRESSION: No mammographic evidence of malignancy. A result letter of this screening mammogram will be mailed directly to the patient.   Assessment and Plan: This is a 77 y.o. female s/p left mastectomy and sentinel node biopsy on 03/2017.  Discussed with the patient that the soreness may be related to radiation changes and these will improve.  There is no evidence of infection or surgical needs at this point.  Her mammogram on the right side was negative.  She will follow up with [place] in 6 months for another physical exam and in a year for mammogram for regular screening.  She is still on Herceptin and discussed with her that we'd be happy to remove her port when Dr. Grayland Ormond clears her for it.  She will give [place] a call  when that's the case.    Melvyn Neth, [place]

## 2017-10-13 ENCOUNTER — Inpatient Hospital Stay: Payer: Medicare Other

## 2017-10-15 ENCOUNTER — Other Ambulatory Visit: Payer: Self-pay | Admitting: Oncology

## 2017-10-18 ENCOUNTER — Inpatient Hospital Stay: Payer: Medicare Other | Attending: Oncology

## 2017-10-18 VITALS — BP 125/71 | HR 63 | Temp 97.2°F | Resp 18

## 2017-10-18 DIAGNOSIS — N289 Disorder of kidney and ureter, unspecified: Secondary | ICD-10-CM | POA: Insufficient documentation

## 2017-10-18 DIAGNOSIS — Z5112 Encounter for antineoplastic immunotherapy: Secondary | ICD-10-CM | POA: Insufficient documentation

## 2017-10-18 DIAGNOSIS — D649 Anemia, unspecified: Secondary | ICD-10-CM | POA: Diagnosis not present

## 2017-10-18 DIAGNOSIS — C50412 Malignant neoplasm of upper-outer quadrant of left female breast: Secondary | ICD-10-CM | POA: Insufficient documentation

## 2017-10-18 DIAGNOSIS — Z171 Estrogen receptor negative status [ER-]: Secondary | ICD-10-CM | POA: Insufficient documentation

## 2017-10-18 DIAGNOSIS — Z7982 Long term (current) use of aspirin: Secondary | ICD-10-CM | POA: Insufficient documentation

## 2017-10-18 DIAGNOSIS — Z79899 Other long term (current) drug therapy: Secondary | ICD-10-CM | POA: Diagnosis not present

## 2017-10-18 DIAGNOSIS — Z9221 Personal history of antineoplastic chemotherapy: Secondary | ICD-10-CM | POA: Insufficient documentation

## 2017-10-18 MED ORDER — TRASTUZUMAB CHEMO 150 MG IV SOLR
450.0000 mg | Freq: Once | INTRAVENOUS | Status: AC
  Administered 2017-10-18: 450 mg via INTRAVENOUS
  Filled 2017-10-18: qty 21.43

## 2017-10-18 MED ORDER — ACETAMINOPHEN 325 MG PO TABS
650.0000 mg | ORAL_TABLET | Freq: Once | ORAL | Status: AC
  Administered 2017-10-18: 650 mg via ORAL
  Filled 2017-10-18: qty 2

## 2017-10-18 MED ORDER — HEPARIN SOD (PORK) LOCK FLUSH 100 UNIT/ML IV SOLN
500.0000 [IU] | Freq: Once | INTRAVENOUS | Status: AC
  Administered 2017-10-18: 500 [IU] via INTRAVENOUS
  Filled 2017-10-18: qty 5

## 2017-10-18 MED ORDER — SODIUM CHLORIDE 0.9 % IV SOLN
Freq: Once | INTRAVENOUS | Status: AC
  Administered 2017-10-18: 09:00:00 via INTRAVENOUS
  Filled 2017-10-18: qty 1000

## 2017-10-18 MED ORDER — SODIUM CHLORIDE 0.9% FLUSH
10.0000 mL | INTRAVENOUS | Status: DC | PRN
  Administered 2017-10-18: 10 mL via INTRAVENOUS
  Filled 2017-10-18: qty 10

## 2017-10-18 MED ORDER — DIPHENHYDRAMINE HCL 25 MG PO CAPS
25.0000 mg | ORAL_CAPSULE | Freq: Once | ORAL | Status: AC
  Administered 2017-10-18: 25 mg via ORAL
  Filled 2017-10-18: qty 1

## 2017-10-21 DIAGNOSIS — H2513 Age-related nuclear cataract, bilateral: Secondary | ICD-10-CM | POA: Diagnosis not present

## 2017-10-27 ENCOUNTER — Telehealth: Payer: Self-pay | Admitting: Family Medicine

## 2017-10-27 MED ORDER — ATORVASTATIN CALCIUM 40 MG PO TABS: 40.0000 mg | ORAL_TABLET | Freq: Every day | ORAL | 1 refills | Status: DC

## 2017-10-27 NOTE — Telephone Encounter (Signed)
Please see below and advise.

## 2017-10-27 NOTE — Telephone Encounter (Signed)
Copied from [place]. Topic: General - Other >> Oct 27, 2017  9:48 AM Oneta Rack wrote: Relation to pt: self  Call back number: [phone] Pharmacy: [facility], [place] [phone] (Phone) [phone] (Fax)   Reason for call:  Patient states she doesn't like cholestyramine 4mg  and would like to resume back to taking atorvastatin 40mg , please advise >> Oct 27, 2017  9:51 AM Oneta Rack wrote: Relation to pt: self  Call back number: [phone] Pharmacy: [facility], [place] [phone] (Phone) [phone] (Fax)   Reason for call:  Patient states she doesn't like cholestyramine 4mg  and would like to resume back to taking atorvastatin 40mg , please advise

## 2017-10-27 NOTE — Telephone Encounter (Signed)
Liver enzymes back to normal Okay to stop cholestyramine and start back on atorvastatin

## 2017-10-28 ENCOUNTER — Encounter
Admission: RE | Admit: 2017-10-28 | Discharge: 2017-10-28 | Disposition: A | Discharge status: Home / Self Care | Payer: Medicare Other | Source: Ambulatory Visit | Attending: Oncology | Admitting: Oncology

## 2017-10-28 DIAGNOSIS — C50412 Malignant neoplasm of upper-outer quadrant of left female breast: Secondary | ICD-10-CM | POA: Diagnosis not present

## 2017-10-28 DIAGNOSIS — Z5111 Encounter for antineoplastic chemotherapy: Secondary | ICD-10-CM | POA: Diagnosis not present

## 2017-10-28 DIAGNOSIS — Z171 Estrogen receptor negative status [ER-]: Secondary | ICD-10-CM | POA: Diagnosis not present

## 2017-10-28 DIAGNOSIS — R9430 Abnormal result of cardiovascular function study, unspecified: Secondary | ICD-10-CM | POA: Diagnosis not present

## 2017-10-28 DIAGNOSIS — C50912 Malignant neoplasm of unspecified site of left female breast: Secondary | ICD-10-CM | POA: Diagnosis not present

## 2017-10-28 MED ORDER — TECHNETIUM TC 99M-LABELED RED BLOOD CELLS IV KIT
21.9100 | PACK | Freq: Once | INTRAVENOUS | Status: AC | PRN
  Administered 2017-10-28: 21.91 via INTRAVENOUS

## 2017-10-31 NOTE — Progress Notes (Signed)
[facility]  [phone] [phone]  ID: [name] OB: [birth year]  MR#: [id number]  BPZ#:[id number]  Patient Care Team: Arnetha Courser, MD as PCP - General (Family Medicine) Clayburn Pert, MD as Consulting Physician (General Surgery) Lloyd Huger, MD as Consulting Physician (Oncology) Olean Ree, MD as Consulting Physician (Surgery)  CHIEF COMPLAINT: Pathologic stage 0 ER/PR negative, HER-2 positive invasive carcinoma of the upper outer quadrant of the left breast.  INTERVAL HISTORY: Patient returns to clinic today for further evaluation and consideration of her 18th and final infusion of maintenance Herceptin.  She has occasional sharp pains in her left breast/chest wall at the site of her surgery, but otherwise feels well. She continues to be mildly anxious. She has no neurologic complaints. She denies any recent fevers or illnesses. She has a good appetite and denies weight loss. She has no chest pain or shortness of breath. She denies any nausea, vomiting, constipation, or diarrhea. She has no urinary complaints.  Patient offers no further specific complaints today.  REVIEW OF SYSTEMS:   Review of Systems  Constitutional: Negative.  Negative for fever, malaise/fatigue and weight loss.  Respiratory: Negative.  Negative for cough and shortness of breath.   Cardiovascular: Negative.  Negative for chest pain and leg swelling.  Gastrointestinal: Negative.  Negative for abdominal pain.  Genitourinary: Negative.   Musculoskeletal: Negative.   Skin: Negative.  Negative for rash.  Neurological: Negative.  Negative for sensory change and weakness.  Psychiatric/Behavioral: Negative for depression. The patient is nervous/anxious.     As per HPI. Otherwise, a complete review of systems is negative.  PAST MEDICAL HISTORY: Past Medical History:  Diagnosis Date  . Anemia   . Breast cancer ([place])   . Breast mass, left 09/22/2016   RECOMMENDATION: Ultrasound-guided core biopsies of masses at left breast 2 o'clock 2 cm from nipple, left breast 2 o'clock 5 cm from nipple, abnormal left axillary lymph nodes.  Marland Kitchen GERD (gastroesophageal reflux disease)   . Hyperglycemia   . Hyperlipidemia   . Obesity   . Postmenopausal     PAST SURGICAL HISTORY: Past Surgical History:  Procedure Laterality Date  . ABDOMINAL HYSTERECTOMY    . AXILLARY SENTINEL NODE BIOPSY Left 04/14/2017   Procedure: AXILLARY SENTINEL NODE BIOPSY;  Surgeon: Olean Ree, MD;  Location: [facility] ORS;  Service: General;  Laterality: Left;  . BREAST BIOPSY Left 09/30/2016   US biopsy of 3 areas + chemo  . BREAST EXCISIONAL BIOPSY Left 04/14/2017   Left breast invasive cancer left mastectomy  . ESOPHAGOGASTRODUODENOSCOPY (EGD) WITH PROPOFOL N/A 12/24/2014   Procedure: ESOPHAGOGASTRODUODENOSCOPY (EGD) WITH PROPOFOL;  Surgeon: Manya Silvas, MD;  Location: [facility];  Service: Endoscopy;  Laterality: N/A;  . MASTECTOMY Left 04/14/2017   Left breast invasive cancer  . PORTACATH PLACEMENT Right 10/27/2016   Procedure: INSERTION PORT-A-CATH;  Surgeon: Nestor Lewandowsky, MD;  Location: [facility] ORS;  Service: General;  Laterality: Right;  . TONSILECTOMY, ADENOIDECTOMY, BILATERAL MYRINGOTOMY AND TUBES    . TONSILLECTOMY    . TOTAL MASTECTOMY Left 04/14/2017   Procedure: TOTAL MASTECTOMY;  Surgeon: Olean Ree, MD;  Location: [facility] ORS;  Service: General;  Laterality: Left;    FAMILY HISTORY: Family History  Problem Relation Age of Onset  . Stroke Mother   . Stroke Father   . Cancer Neg Hx   . Depression Neg Hx     ADVANCED DIRECTIVES (Y/N):  N  HEALTH MAINTENANCE: Social History   Tobacco Use  . Smoking  status: Never Smoker  . Smokeless tobacco: Never Used  Substance Use Topics  . Alcohol use: No    Alcohol/week: 0.0 oz  . Drug use: No     Colonoscopy:  PAP:  Bone density:  Lipid panel:  Allergies  Allergen Reactions  . Latex Itching  .  Penicillins Itching and Rash    Has patient had a PCN reaction causing immediate rash, facial/tongue/throat swelling, SOB or lightheadedness with hypotension: Yes Has patient had a PCN reaction causing severe rash involving mucus membranes or skin necrosis: No Has patient had a PCN reaction that required hospitalization No Has patient had a PCN reaction occurring within the last 10 years: No If all of the above answers are "NO", then may proceed with Cephalosporin use.     Current Outpatient Medications  Medication Sig Dispense Refill  . acetaminophen (TYLENOL) 500 MG tablet Take 1,000 mg by mouth every 6 (six) hours as needed for mild pain.    Marland Kitchen aspirin 81 MG tablet Take 81 mg by mouth daily.    Marland Kitchen atorvastatin (LIPITOR) 40 MG tablet Take 1 tablet (40 mg total) by mouth at bedtime. 90 tablet 1  . Cholecalciferol (VITAMIN D) 2000 units tablet Take 2,000 Units by mouth 3 (three) times a week. One by mouth twice a week    . doxycycline (VIBRA-TABS) 100 MG tablet Take 1 tablet (100 mg total) by mouth 2 (two) times daily. 14 tablet 0  . ferrous sulfate 325 (65 FE) MG EC tablet Take 325 mg by mouth 3 (three) times a week.     . fluticasone (FLONASE) 50 MCG/ACT nasal spray Place 1 spray into both nostrils daily as needed for allergies or rhinitis. 16 g 11  . Multiple Vitamin (MULTIVITAMIN WITH MINERALS) TABS tablet Take 1 tablet by mouth 3 (three) times a week.     Vladimir Faster Glycol-Propyl Glycol (SYSTANE OP) Place 1 drop into both eyes daily as needed (dry eyes).     . silver sulfADIAZINE (SILVADENE) 1 % cream Apply 1 application topically 2 (two) times daily. 50 g 3  . traZODone (DESYREL) 50 MG tablet Take 0.5-1 tablets (25-50 mg total) by mouth at bedtime as needed for sleep. 30 tablet 3   No current facility-administered medications for this visit.     OBJECTIVE: Vitals:   11/03/17 0857  BP: 134/73  Pulse: 76  Resp: 20  Temp: (!) 97.1 F (36.2 C)     Body mass index is 32.23 kg/m.     ECOG FS:0 - Asymptomatic  General: Well-developed, well-nourished, no acute distress. Eyes: Pink conjunctiva, anicteric sclera. Breast: Left mastectomy with no obvious evidence of recurrence. Lungs: Clear to auscultation bilaterally. Heart: Regular rate and rhythm. No rubs, murmurs, or gallops. Abdomen: Soft, nontender, nondistended. No organomegaly noted, normoactive bowel sounds. Musculoskeletal: No edema, cyanosis, or clubbing. Neuro: Alert, answering all questions appropriately. Cranial nerves grossly intact. Skin: No rashes or petechiae noted. Psych: Normal affect.  LAB RESULTS:  Lab Results  Component Value Date   NA 139 11/03/2017   K 4.2 11/03/2017   CL 112 (H) 11/03/2017   CO2 21 (L) 11/03/2017   GLUCOSE 91 11/03/2017   BUN 24 (H) 11/03/2017   CREATININE 1.19 (H) 11/03/2017   CALCIUM 9.0 11/03/2017   PROT 6.8 11/03/2017   ALBUMIN 3.6 11/03/2017   AST 23 11/03/2017   ALT 18 11/03/2017   ALKPHOS 112 11/03/2017   BILITOT 0.6 11/03/2017   GFRNONAA 43 (L) 11/03/2017   GFRAA 49 (L)  11/03/2017    Lab Results  Component Value Date   WBC 4.1 11/03/2017   NEUTROABS 2.6 11/03/2017   HGB 10.4 (L) 11/03/2017   HCT 30.5 (L) 11/03/2017   MCV 92.9 11/03/2017   PLT 172 11/03/2017     STUDIES: Nm Cardiac Muga Rest  Result Date: 10/28/2017 CLINICAL DATA:  LEFT breast cancer, cardiotoxic chemotherapy EXAM: NUCLEAR MEDICINE CARDIAC BLOOD POOL IMAGING (MUGA) TECHNIQUE: Cardiac multi-gated acquisition was performed at rest following intravenous injection of Tc-20mlabeled red blood cells. RADIOPHARMACEUTICALS:  21.91 mCi Tc-98mertechnetate in-vitro labeled red blood cells IV COMPARISON:  07/23/2017 FINDINGS: Calculated LEFT ventricular ejection fraction is 53%, decreased from the 61% on the previous exam. Study was obtained at a cardiac rate of 69 bpm. Patient was rhythmic during imaging. Cine analysis of the LEFT ventricle in 3 projections demonstrates normal LEFT ventricular  wall motion. IMPRESSION: LEFT ventricular ejection fraction of 53% decreased from the 61% on the previous exam. Normal LV wall motion. Electronically Signed   By: MaLavonia Dana.D.   On: 10/28/2017 10:41    ASSESSMENT: Pathologic stage 0 ER/PR negative, HER-2 positive invasive carcinoma of the upper outer quadrant of the left breast.  PLAN:    1. 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 She completed neoadjuvant Taxotere, carboplatinum, Herceptin, and Perjeta on February 17, 2017. She had a total mastectomy on April 14, 2017, therefore did not require adjuvant XRT. An aromatase inhibitor would not offer benefit given the ER/PR negativity of her disease.  MUGA scan on October 28, 2017 decreased to 53%, but still adequate to proceed with treatment.  Proceed with cycle 18 and final infusion of maintenance Herceptin.  Patient has not completed all of her treatments.  No further interventions are needed.  Return to clinic in 3 months for routine evaluation. 2. Anemia: Hemoglobin remains decreased and relatively unchanged. 3. Renal insufficiency: Creatinine is elevated, but appears approximately her baseline. 4. Hypertension: Blood pressure is within normal limits today.  Approximately 30 minutes was spent in discussion of which greater than 50% was consultation.  Patient expressed understanding and was in agreement with this plan. She also understands that She can call clinic at any time with any questions, concerns, or complaints.   Cancer Staging Malignant neoplasm of upper-outer quadrant of left female breast ([facility] PavilionStaging form: Breast, AJCC 8th Edition - Clinical stage from 10/11/2016: Stage IIB (cT2, cN1, cM0, G3, ER: Negative, PR: Negative, HER2: Positive) - Signed by FiLloyd HugerMD on 10/11/2016 - Pathologic stage from 04/23/2017: No Stage  Recommended (ypT0, pN0, cM0, G3, ER: Negative, PR: Negative, HER2: Positive) - Signed by FiLloyd HugerMD on 04/23/2017   TiLloyd HugerMD   11/07/2017 7:15 AM

## 2017-11-02 ENCOUNTER — Ambulatory Visit (INDEPENDENT_AMBULATORY_CARE_PROVIDER_SITE_OTHER): Payer: Medicare Other | Admitting: Family Medicine

## 2017-11-02 ENCOUNTER — Encounter: Payer: Self-pay | Admitting: Family Medicine

## 2017-11-02 ENCOUNTER — Other Ambulatory Visit: Payer: Self-pay | Admitting: Oncology

## 2017-11-02 VITALS — BP 116/68 | HR 88 | Temp 98.4°F | Resp 14 | Ht 61.0 in | Wt 168.6 lb

## 2017-11-02 DIAGNOSIS — R102 Pelvic and perineal pain: Secondary | ICD-10-CM | POA: Diagnosis not present

## 2017-11-02 DIAGNOSIS — I700 Atherosclerosis of aorta: Secondary | ICD-10-CM

## 2017-11-02 DIAGNOSIS — G4700 Insomnia, unspecified: Secondary | ICD-10-CM | POA: Diagnosis not present

## 2017-11-02 DIAGNOSIS — N183 Chronic kidney disease, stage 3 unspecified: Secondary | ICD-10-CM

## 2017-11-02 DIAGNOSIS — N3090 Cystitis, unspecified without hematuria: Secondary | ICD-10-CM

## 2017-11-02 DIAGNOSIS — Z171 Estrogen receptor negative status [ER-]: Secondary | ICD-10-CM

## 2017-11-02 DIAGNOSIS — C50412 Malignant neoplasm of upper-outer quadrant of left female breast: Secondary | ICD-10-CM | POA: Diagnosis not present

## 2017-11-02 DIAGNOSIS — J3089 Other allergic rhinitis: Secondary | ICD-10-CM

## 2017-11-02 DIAGNOSIS — D649 Anemia, unspecified: Secondary | ICD-10-CM

## 2017-11-02 DIAGNOSIS — E785 Hyperlipidemia, unspecified: Secondary | ICD-10-CM

## 2017-11-02 LAB — POCT URINALYSIS DIPSTICK
APPEARANCE: NORMAL
Bilirubin, UA: NEGATIVE
Blood, UA: NEGATIVE
Glucose, UA: NEGATIVE
Ketones, UA: NEGATIVE
NITRITE UA: NEGATIVE
Odor: NORMAL
PROTEIN UA: NEGATIVE
SPEC GRAV UA: 1.01 (ref 1.010–1.025)
Urobilinogen, UA: 0.2 E.U./dL
pH, UA: 5 (ref 5.0–8.0)

## 2017-11-02 MED ORDER — DOXYCYCLINE HYCLATE 100 MG PO TABS: 100.0000 mg | ORAL_TABLET | Freq: Two times a day (BID) | ORAL | 0 refills | Status: DC

## 2017-11-02 MED ORDER — TRAZODONE HCL 50 MG PO TABS: 25.0000 mg | ORAL_TABLET | Freq: Every evening | ORAL | 3 refills | Status: DC | PRN

## 2017-11-02 NOTE — Assessment & Plan Note (Signed)
Goal LDL less than 70; back on statin; try to limit saturated fats

## 2017-11-02 NOTE — Assessment & Plan Note (Signed)
Back on statin; check lipids in 6 weeks

## 2017-11-02 NOTE — Progress Notes (Signed)
BP 116/68   Pulse 88   Temp 98.4 F (36.9 C) (Oral)   Resp 14   Ht 5\' 1"  (1.549 m)   Wt 168 lb 9.6 oz (76.5 kg)   SpO2 97%   BMI 31.86 kg/m    Subjective:    Patient ID: Maria Patterson, female    DOB: Sep 06, 1940, 77 y.o.   MRN: [id number]  HPI: Maria Patterson is a 77 y.o. female  Chief Complaint  Patient presents with  . Follow-up  . Urinary Tract Infection    after she urinates states she has a pain in pelvic region    HPI She is here for follow-up  She thinks she has a bladder infection; every time she urinates, she has pain over her bladder; when she drinks sodas, she gets that feeling; keeps going; quit drinking coffee to see if it would help; going on for three weeks; no fever here or at home; no odor to the urine; no blood in the urine; no tendneress, "nothing to be excited about" she mentioned when I pressed on her suprapubic region during the exam  She is having soreness on the outside of the left breast, a little swelling; she is going to see Dr. Grayland Ormond, her cancer doctor, tomorrow; she will let him know and have him check; no night sweats or weight loss; they are checking CBC and CMP with infusions; last reviewed  Her nasal congestion is doing "alright"; using nasal spray, "only thing that helped me"  She was having trouble sleeping; it's not getting any better; it's all the time that she's got something on her mind; she has bills; every week, two or three bills coming in, all the hospital bills; paying for that; no pets  She was taking questran for her cholesterol; she told me then that she stopped it and back on statin; no muscle aches  Depression screen [facility] 2/9 11/02/2017 09/23/2017 09/20/2017 08/09/2017 06/03/2017  Decreased Interest 0 0 0 0 0  Down, Depressed, Hopeless 1 0 0 0 0  PHQ - 2 Score 1 0 0 0 0    Relevant past medical, surgical, family and social history reviewed Past Medical History:  Diagnosis Date  . Anemia   . Breast cancer ([place])   . Breast  mass, left 09/22/2016   RECOMMENDATION: Ultrasound-guided core biopsies of masses at left breast 2 o'clock 2 cm from nipple, left breast 2 o'clock 5 cm from nipple, abnormal left axillary lymph nodes.  Marland Kitchen GERD (gastroesophageal reflux disease)   . Hyperglycemia   . Hyperlipidemia   . Obesity   . Postmenopausal    Past Surgical History:  Procedure Laterality Date  . ABDOMINAL HYSTERECTOMY    . AXILLARY SENTINEL NODE BIOPSY Left 04/14/2017   Procedure: AXILLARY SENTINEL NODE BIOPSY;  Surgeon: Olean Ree, MD;  Location: [facility] ORS;  Service: General;  Laterality: Left;  . BREAST BIOPSY Left 09/30/2016   US biopsy of 3 areas + chemo  . BREAST EXCISIONAL BIOPSY Left 04/14/2017   Left breast invasive cancer left mastectomy  . ESOPHAGOGASTRODUODENOSCOPY (EGD) WITH PROPOFOL N/A 12/24/2014   Procedure: ESOPHAGOGASTRODUODENOSCOPY (EGD) WITH PROPOFOL;  Surgeon: Manya Silvas, MD;  Location: [facility] ENDOSCOPY;  Service: Endoscopy;  Laterality: N/A;  . MASTECTOMY Left 04/14/2017   Left breast invasive cancer  . PORTACATH PLACEMENT Right 10/27/2016   Procedure: INSERTION PORT-A-CATH;  Surgeon: Nestor Lewandowsky, MD;  Location: [facility] ORS;  Service: General;  Laterality: Right;  . TONSILECTOMY, ADENOIDECTOMY, BILATERAL MYRINGOTOMY AND TUBES    .  TONSILLECTOMY    . TOTAL MASTECTOMY Left 04/14/2017   Procedure: TOTAL MASTECTOMY;  Surgeon: Olean Ree, MD;  Location: [facility] ORS;  Service: General;  Laterality: Left;   Family History  Problem Relation Age of Onset  . Stroke Mother   . Stroke Father   . Cancer Neg Hx   . Depression Neg Hx    Social History   Tobacco Use  . Smoking status: Never Smoker  . Smokeless tobacco: Never Used  Substance Use Topics  . Alcohol use: No    Alcohol/week: 0.0 oz  . Drug use: No    Interim medical history since last visit reviewed. Allergies and medications reviewed  Review of Systems Per HPI unless specifically indicated above     Objective:    BP 116/68    Pulse 88   Temp 98.4 F (36.9 C) (Oral)   Resp 14   Ht 5\' 1"  (1.549 m)   Wt 168 lb 9.6 oz (76.5 kg)   SpO2 97%   BMI 31.86 kg/m   Wt Readings from Last 3 Encounters:  11/02/17 168 lb 9.6 oz (76.5 kg)  10/04/17 169 lb 9.6 oz (76.9 kg)  09/23/17 171 lb 1.2 oz (77.6 kg)    Physical Exam  Constitutional: She appears well-developed and well-nourished. No distress.  HENT:  Head: Normocephalic and atraumatic.  Eyes: EOM are normal. No scleral icterus.  Neck: No thyromegaly present.  Cardiovascular: Normal rate, regular rhythm and normal heart sounds.  No murmur heard. Pulmonary/Chest: Effort normal and breath sounds normal. No respiratory distress. She has no wheezes.  Abdominal: Soft. Bowel sounds are normal. She exhibits no distension.  Musculoskeletal: Normal range of motion. She exhibits no edema.  Neurological: She is alert. She exhibits normal muscle tone.  Skin: Skin is warm and dry. She is not diaphoretic. No pallor.  Psychiatric: She has a normal mood and affect. Her behavior is normal. Judgment and thought content normal.    Results for orders placed or performed in visit on 11/02/17  POCT urinalysis dipstick  Result Value Ref Range   Color, UA yellow    Clarity, UA clear    Glucose, UA neg    Bilirubin, UA neg    Ketones, UA neg    Spec Grav, UA 1.010 1.010 - 1.025   Blood, UA neg    pH, UA 5.0 5.0 - 8.0   Protein, UA neg    Urobilinogen, UA 0.2 0.2 or 1.0 E.U./dL   Nitrite, UA neg    Leukocytes, UA Trace (A) Negative   Appearance normal    Odor normal       Assessment & Plan:   Problem List Items Addressed This Visit      Cardiovascular and Mediastinum   Aortic atherosclerosis (HCC)    Goal LDL less than 70; back on statin; try to limit saturated fats        Respiratory   Allergic rhinitis    Well controlled with the nasal spray        Genitourinary   Chronic renal disease, stage III (HCC)    Use doxy for cystitis; avoid NSAIDs        Other     Malignant neoplasm of upper-outer quadrant of left female breast [facility])    Current treatment with Dr. Grayland Ormond; finished with radiation oncologist      Relevant Medications   doxycycline (VIBRA-TABS) 100 MG tablet   Insomnia    Start trazodone, see if that will; don't take worries  to bed      Hyperlipidemia LDL goal <100    Back on statin; check lipids in 6 weeks      Relevant Orders   Lipid panel   Anemia    Monitored by cancer doctor       Other Visit Diagnoses    Suprapubic abdominal pain    -  Primary   Relevant Orders   POCT urinalysis dipstick (Completed)   Cystitis       Relevant Orders   Urine Culture       Follow up plan: Return in about 4 months (around 03/04/2018) for follow-up visit with Dr. Sanda Klein.  An after-visit summary was printed and given to the patient at [facility].  Please see the patient instructions which may contain other information and recommendations beyond what is mentioned above in the assessment and plan.  Meds ordered this encounter  Medications  . traZODone (DESYREL) 50 MG tablet    Sig: Take 0.5-1 tablets (25-50 mg total) by mouth at bedtime as needed for sleep.    Dispense:  30 tablet    Refill:  3  . doxycycline (VIBRA-TABS) 100 MG tablet    Sig: Take 1 tablet (100 mg total) by mouth 2 (two) times daily.    Dispense:  14 tablet    Refill:  0    Orders Placed This Encounter  Procedures  . Urine Culture  . Lipid panel  . POCT urinalysis dipstick

## 2017-11-02 NOTE — Assessment & Plan Note (Signed)
Monitored by cancer doctor 

## 2017-11-02 NOTE — Assessment & Plan Note (Signed)
Well controlled with the nasal spray

## 2017-11-02 NOTE — Patient Instructions (Addendum)
Try the new medicine if needed for sleep Try to not carry your worries to bed with you Please return JUST for fasting labs on May 28th or sometime that week Start the antibiotics Please do eat yogurt or kimchi or take a probiotic daily for the next month We want to replace the healthy germs in the gut If you notice foul, watery diarrhea in the next two months, schedule an appointment RIGHT AWAY or go to an urgent care or the emergency room if a holiday or over a weekend   Insomnia Insomnia is a sleep disorder that makes it difficult to fall asleep or to stay asleep. Insomnia can cause tiredness (fatigue), low energy, difficulty concentrating, mood swings, and poor performance at work or school. There are three different ways to classify insomnia:  Difficulty falling asleep.  Difficulty staying asleep.  Waking up too early in the morning.  Any type of insomnia can be long-term (chronic) or short-term (acute). Both are common. Short-term insomnia usually lasts for three months or less. Chronic insomnia occurs at least three times a week for longer than three months. What are the causes? Insomnia may be caused by another condition, situation, or substance, such as:  Anxiety.  Certain medicines.  Gastroesophageal reflux disease (GERD) or other gastrointestinal conditions.  Asthma or other breathing conditions.  Restless legs syndrome, sleep apnea, or other sleep disorders.  Chronic pain.  Menopause. This may include hot flashes.  Stroke.  Abuse of alcohol, tobacco, or illegal drugs.  Depression.  Caffeine.  Neurological disorders, such as Alzheimer disease.  An overactive thyroid (hyperthyroidism).  The cause of insomnia may not be known. What increases the risk? Risk factors for insomnia include:  Gender. Women are more commonly affected than men.  Age. Insomnia is more common as you get older.  Stress. This may involve your professional or personal  life.  Income. Insomnia is more common in people with lower income.  Lack of exercise.  Irregular work schedule or night shifts.  Traveling between different time zones.  What are the signs or symptoms? If you have insomnia, trouble falling asleep or trouble staying asleep is the main symptom. This may lead to other symptoms, such as:  Feeling fatigued.  Feeling nervous about going to sleep.  Not feeling rested in the morning.  Having trouble concentrating.  Feeling irritable, anxious, or depressed.  How is this treated? Treatment for insomnia depends on the cause. If your insomnia is caused by an underlying condition, treatment will focus on addressing the condition. Treatment may also include:  Medicines to help you sleep.  Counseling or therapy.  Lifestyle adjustments.  Follow these instructions at home:  Take medicines only as directed by your health care provider.  Keep regular sleeping and waking hours. Avoid naps.  Keep a sleep diary to help you and your health care provider figure out what could be causing your insomnia. Include: ? When you sleep. ? When you wake up during the night. ? How well you sleep. ? How rested you feel the next day. ? Any side effects of medicines you are taking. ? What you eat and drink.  Make your bedroom a comfortable place where it is easy to fall asleep: ? Put up shades or special blackout curtains to block light from outside. ? Use a white noise machine to block noise. ? Keep the temperature cool.  Exercise regularly as directed by your health care provider. Avoid exercising right before bedtime.  Use relaxation techniques to  manage stress. Ask your health care provider to suggest some techniques that may work well for you. These may include: ? Breathing exercises. ? Routines to release muscle tension. ? Visualizing peaceful scenes.  Cut back on alcohol, caffeinated beverages, and cigarettes, especially close to bedtime.  These can disrupt your sleep.  Do not overeat or eat spicy foods right before bedtime. This can lead to digestive discomfort that can make it hard for you to sleep.  Limit screen use before bedtime. This includes: ? Watching TV. ? Using your smartphone, tablet, and computer.  Stick to a routine. This can help you fall asleep faster. Try to do a quiet activity, brush your teeth, and go to bed at the same time each night.  Get out of bed if you are still awake after 15 minutes of trying to sleep. Keep the lights down, but try reading or doing a quiet activity. When you feel sleepy, go back to bed.  Make sure that you drive carefully. Avoid driving if you feel very sleepy.  Keep all follow-up appointments as directed by your health care provider. This is important. Contact a health care provider if:  You are tired throughout the day or have trouble in your daily routine due to sleepiness.  You continue to have sleep problems or your sleep problems get worse. Get help right away if:  You have serious thoughts about hurting yourself or someone else. This information is not intended to replace advice given to you by your health care provider. Make sure you discuss any questions you have with your health care provider. Document Released: 07/03/2000 Document Revised: 12/06/2015 Document Reviewed: 04/06/2014 Elsevier Interactive Patient Education  [employer].

## 2017-11-02 NOTE — Assessment & Plan Note (Signed)
Start trazodone, see if that will; don't take worries to bed

## 2017-11-02 NOTE — Assessment & Plan Note (Signed)
Use doxy for cystitis; avoid NSAIDs

## 2017-11-02 NOTE — Assessment & Plan Note (Signed)
Current treatment with Dr. Grayland Ormond; finished with radiation oncologist

## 2017-11-03 ENCOUNTER — Inpatient Hospital Stay: Payer: Medicare Other

## 2017-11-03 ENCOUNTER — Telehealth: Payer: Self-pay

## 2017-11-03 ENCOUNTER — Encounter: Payer: Self-pay | Admitting: Oncology

## 2017-11-03 ENCOUNTER — Inpatient Hospital Stay (HOSPITAL_BASED_OUTPATIENT_CLINIC_OR_DEPARTMENT_OTHER): Payer: Medicare Other | Admitting: Oncology

## 2017-11-03 VITALS — BP 134/73 | HR 76 | Temp 97.1°F | Resp 20 | Wt 170.6 lb

## 2017-11-03 DIAGNOSIS — Z7982 Long term (current) use of aspirin: Secondary | ICD-10-CM | POA: Diagnosis not present

## 2017-11-03 DIAGNOSIS — Z171 Estrogen receptor negative status [ER-]: Principal | ICD-10-CM

## 2017-11-03 DIAGNOSIS — C50412 Malignant neoplasm of upper-outer quadrant of left female breast: Secondary | ICD-10-CM

## 2017-11-03 DIAGNOSIS — Z9221 Personal history of antineoplastic chemotherapy: Secondary | ICD-10-CM | POA: Diagnosis not present

## 2017-11-03 DIAGNOSIS — N289 Disorder of kidney and ureter, unspecified: Secondary | ICD-10-CM

## 2017-11-03 DIAGNOSIS — Z79899 Other long term (current) drug therapy: Secondary | ICD-10-CM | POA: Diagnosis not present

## 2017-11-03 DIAGNOSIS — D649 Anemia, unspecified: Secondary | ICD-10-CM | POA: Diagnosis not present

## 2017-11-03 DIAGNOSIS — Z5112 Encounter for antineoplastic immunotherapy: Secondary | ICD-10-CM | POA: Diagnosis not present

## 2017-11-03 DIAGNOSIS — C50919 Malignant neoplasm of unspecified site of unspecified female breast: Secondary | ICD-10-CM

## 2017-11-03 LAB — COMPREHENSIVE METABOLIC PANEL
ALBUMIN: 3.6 g/dL (ref 3.5–5.0)
ALT: 18 U/L (ref 14–54)
AST: 23 U/L (ref 15–41)
Alkaline Phosphatase: 112 U/L (ref 38–126)
Anion gap: 6 (ref 5–15)
BUN: 24 mg/dL — AB (ref 6–20)
CALCIUM: 9 mg/dL (ref 8.9–10.3)
CO2: 21 mmol/L — AB (ref 22–32)
CREATININE: 1.19 mg/dL — AB (ref 0.44–1.00)
Chloride: 112 mmol/L — ABNORMAL HIGH (ref 101–111)
GFR calc Af Amer: 49 mL/min — ABNORMAL LOW (ref >60)
GFR calc non Af Amer: 43 mL/min — ABNORMAL LOW (ref >60)
GLUCOSE: 91 mg/dL (ref 65–99)
Potassium: 4.2 mmol/L (ref 3.5–5.1)
SODIUM: 139 mmol/L (ref 135–145)
Total Bilirubin: 0.6 mg/dL (ref 0.3–1.2)
Total Protein: 6.8 g/dL (ref 6.5–8.1)

## 2017-11-03 LAB — CBC WITH DIFFERENTIAL/PLATELET
BASOS PCT: 1 %
Basophils Absolute: 0 10*3/uL (ref 0–0.1)
EOS ABS: 0.1 10*3/uL (ref 0–0.7)
EOS PCT: 3 %
HCT: 30.5 % — ABNORMAL LOW (ref 35.0–47.0)
Hemoglobin: 10.4 g/dL — ABNORMAL LOW (ref 12.0–16.0)
Lymphocytes Relative: 26 %
Lymphs Abs: 1.1 10*3/uL (ref 1.0–3.6)
MCH: 31.7 pg (ref 26.0–34.0)
MCHC: 34.2 g/dL (ref 32.0–36.0)
MCV: 92.9 fL (ref 80.0–100.0)
MONO ABS: 0.3 10*3/uL (ref 0.2–0.9)
Monocytes Relative: 7 %
Neutro Abs: 2.6 10*3/uL (ref 1.4–6.5)
Neutrophils Relative %: 63 %
Platelets: 172 10*3/uL (ref 150–440)
RBC: 3.28 MIL/uL — ABNORMAL LOW (ref 3.80–5.20)
RDW: 15.4 % — AB (ref 11.5–14.5)
WBC: 4.1 10*3/uL (ref 3.6–11.0)

## 2017-11-03 LAB — URINE CULTURE
MICRO NUMBER:: 90466776
Result:: NO GROWTH
SPECIMEN QUALITY: ADEQUATE

## 2017-11-03 MED ORDER — ACETAMINOPHEN 325 MG PO TABS
650.0000 mg | ORAL_TABLET | Freq: Once | ORAL | Status: AC
  Administered 2017-11-03: 650 mg via ORAL
  Filled 2017-11-03: qty 2

## 2017-11-03 MED ORDER — DIPHENHYDRAMINE HCL 25 MG PO CAPS
25.0000 mg | ORAL_CAPSULE | Freq: Once | ORAL | Status: AC
  Administered 2017-11-03: 25 mg via ORAL
  Filled 2017-11-03: qty 1

## 2017-11-03 MED ORDER — TRASTUZUMAB CHEMO 150 MG IV SOLR
450.0000 mg | Freq: Once | INTRAVENOUS | Status: AC
  Administered 2017-11-03: 450 mg via INTRAVENOUS
  Filled 2017-11-03: qty 21.43

## 2017-11-03 MED ORDER — SODIUM CHLORIDE 0.9 % IV SOLN
Freq: Once | INTRAVENOUS | Status: AC
  Administered 2017-11-03: 10:00:00 via INTRAVENOUS
  Filled 2017-11-03: qty 1000

## 2017-11-03 MED ORDER — HEPARIN SOD (PORK) LOCK FLUSH 100 UNIT/ML IV SOLN
500.0000 [IU] | Freq: Once | INTRAVENOUS | Status: AC | PRN
  Administered 2017-11-03: 500 [IU]
  Filled 2017-11-03: qty 5

## 2017-11-03 NOTE — Telephone Encounter (Signed)
Called pt no answer. LM for pt informing her of lab results per Dr.Lada. CRM created. Labs routed to [facility].

## 2017-11-03 NOTE — Telephone Encounter (Signed)
-----   Message from Arnetha Courser, MD sent at 11/03/2017  2:18 PM EDT ----- Guerry Minors, please let pt know that her urine culture did not grow out any significant infection; we'll have her stay hydrated, avoid caffeine and chocolate and see if that helps; if still having symptoms in 1-2 weeks, please let us know

## 2017-11-03 NOTE — Progress Notes (Signed)
Patient here today for follow up regarding breast cancer. Patient reports intermittent pain to left breast.

## 2017-12-16 ENCOUNTER — Inpatient Hospital Stay: Payer: Medicare Other

## 2017-12-16 ENCOUNTER — Inpatient Hospital Stay (HOSPITAL_BASED_OUTPATIENT_CLINIC_OR_DEPARTMENT_OTHER): Payer: Medicare Other | Admitting: Oncology

## 2017-12-16 ENCOUNTER — Inpatient Hospital Stay: Payer: Medicare Other | Attending: Oncology

## 2017-12-16 DIAGNOSIS — Z452 Encounter for adjustment and management of vascular access device: Secondary | ICD-10-CM | POA: Diagnosis not present

## 2017-12-16 DIAGNOSIS — C50412 Malignant neoplasm of upper-outer quadrant of left female breast: Secondary | ICD-10-CM

## 2017-12-16 DIAGNOSIS — Z95828 Presence of other vascular implants and grafts: Secondary | ICD-10-CM

## 2017-12-16 DIAGNOSIS — Z171 Estrogen receptor negative status [ER-]: Secondary | ICD-10-CM

## 2017-12-16 MED ORDER — HEPARIN SOD (PORK) LOCK FLUSH 100 UNIT/ML IV SOLN
500.0000 [IU] | Freq: Once | INTRAVENOUS | Status: AC
  Administered 2017-12-16: 500 [IU] via INTRAVENOUS

## 2017-12-16 MED ORDER — SODIUM CHLORIDE 0.9% FLUSH
10.0000 mL | Freq: Once | INTRAVENOUS | Status: AC
  Administered 2017-12-16: 10 mL via INTRAVENOUS
  Filled 2017-12-16: qty 10

## 2017-12-16 NOTE — Progress Notes (Signed)
Patient not seen by NP. Note entered in error.

## 2017-12-16 NOTE — Progress Notes (Signed)
Survivorship Care Plan visit completed.  Treatment summary reviewed and given to patient.  ASCO answers booklet reviewed and given to patient.  CARE program and Cancer Transitions discussed with patient along with other resources cancer center offers to patients and caregivers.  Patient verbalized understanding.  Patient refused to see NP.  States she does not need to see them and she needs to pick up granddaughter.

## 2018-01-15 ENCOUNTER — Emergency Department: Payer: Medicare Other

## 2018-01-15 ENCOUNTER — Emergency Department
Admission: EM | Admit: 2018-01-15 | Discharge: 2018-01-15 | Disposition: A | Discharge status: Home / Self Care | Payer: Medicare Other | Attending: Emergency Medicine | Admitting: Emergency Medicine

## 2018-01-15 ENCOUNTER — Other Ambulatory Visit: Payer: Self-pay

## 2018-01-15 DIAGNOSIS — Y69 Unspecified misadventure during surgical and medical care: Secondary | ICD-10-CM | POA: Insufficient documentation

## 2018-01-15 DIAGNOSIS — R0789 Other chest pain: Secondary | ICD-10-CM | POA: Diagnosis not present

## 2018-01-15 DIAGNOSIS — Z9221 Personal history of antineoplastic chemotherapy: Secondary | ICD-10-CM | POA: Insufficient documentation

## 2018-01-15 DIAGNOSIS — Z853 Personal history of malignant neoplasm of breast: Secondary | ICD-10-CM | POA: Diagnosis not present

## 2018-01-15 DIAGNOSIS — N183 Chronic kidney disease, stage 3 (moderate): Secondary | ICD-10-CM | POA: Insufficient documentation

## 2018-01-15 DIAGNOSIS — Z79899 Other long term (current) drug therapy: Secondary | ICD-10-CM | POA: Diagnosis not present

## 2018-01-15 DIAGNOSIS — R079 Chest pain, unspecified: Secondary | ICD-10-CM | POA: Diagnosis not present

## 2018-01-15 DIAGNOSIS — T82848A Pain from vascular prosthetic devices, implants and grafts, initial encounter: Secondary | ICD-10-CM | POA: Diagnosis present

## 2018-01-15 DIAGNOSIS — Z91040 Latex allergy status: Secondary | ICD-10-CM | POA: Diagnosis not present

## 2018-01-15 DIAGNOSIS — J9811 Atelectasis: Secondary | ICD-10-CM | POA: Diagnosis not present

## 2018-01-15 DIAGNOSIS — Z7982 Long term (current) use of aspirin: Secondary | ICD-10-CM | POA: Diagnosis not present

## 2018-01-15 LAB — CBC
HEMATOCRIT: 32.9 % — AB (ref 35.0–47.0)
Hemoglobin: 11.3 g/dL — ABNORMAL LOW (ref 12.0–16.0)
MCH: 31.9 pg (ref 26.0–34.0)
MCHC: 34.2 g/dL (ref 32.0–36.0)
MCV: 93.2 fL (ref 80.0–100.0)
PLATELETS: 174 10*3/uL (ref 150–440)
RBC: 3.53 MIL/uL — AB (ref 3.80–5.20)
RDW: 14.8 % — ABNORMAL HIGH (ref 11.5–14.5)
WBC: 4.2 10*3/uL (ref 3.6–11.0)

## 2018-01-15 LAB — COMPREHENSIVE METABOLIC PANEL
ALBUMIN: 3.7 g/dL (ref 3.5–5.0)
ALT: 18 U/L (ref 0–44)
AST: 25 U/L (ref 15–41)
Alkaline Phosphatase: 108 U/L (ref 38–126)
Anion gap: 7 (ref 5–15)
BILIRUBIN TOTAL: 0.3 mg/dL (ref 0.3–1.2)
BUN: 25 mg/dL — AB (ref 8–23)
CHLORIDE: 113 mmol/L — AB (ref 98–111)
CO2: 21 mmol/L — ABNORMAL LOW (ref 22–32)
CREATININE: 1.16 mg/dL — AB (ref 0.44–1.00)
Calcium: 8.9 mg/dL (ref 8.9–10.3)
GFR calc Af Amer: 51 mL/min — ABNORMAL LOW (ref >60)
GFR, EST NON AFRICAN AMERICAN: 44 mL/min — AB (ref >60)
GLUCOSE: 94 mg/dL (ref 70–99)
Potassium: 4.2 mmol/L (ref 3.5–5.1)
Sodium: 141 mmol/L (ref 135–145)
Total Protein: 6.9 g/dL (ref 6.5–8.1)

## 2018-01-15 LAB — TROPONIN I: Troponin I: <0.03 ng/mL (ref <0.03)

## 2018-01-15 MED ORDER — NAPROXEN 250 MG PO TABS: 250.0000 mg | ORAL_TABLET | Freq: Two times a day (BID) | ORAL | 0 refills | Status: DC

## 2018-01-15 MED ORDER — ONDANSETRON HCL 4 MG/2ML IJ SOLN
4.0000 mg | Freq: Once | INTRAMUSCULAR | Status: AC
  Administered 2018-01-15: 4 mg via INTRAVENOUS

## 2018-01-15 MED ORDER — ACETAMINOPHEN 325 MG PO TABS: 650.0000 mg | ORAL_TABLET | Freq: Four times a day (QID) | ORAL | 0 refills | Status: AC | PRN

## 2018-01-15 MED ORDER — ONDANSETRON 4 MG PO TBDP: 4.0000 mg | ORAL_TABLET | Freq: Three times a day (TID) | ORAL | 0 refills | Status: DC | PRN

## 2018-01-15 MED ORDER — IOHEXOL 350 MG/ML SOLN
60.0000 mL | Freq: Once | INTRAVENOUS | Status: AC | PRN
  Administered 2018-01-15: 07:00:00 via INTRAVENOUS

## 2018-01-15 MED ORDER — ONDANSETRON HCL 4 MG/2ML IJ SOLN
INTRAMUSCULAR | Status: AC
  Administered 2018-01-15: 4 mg via INTRAVENOUS
  Filled 2018-01-15: qty 2

## 2018-01-15 NOTE — ED Provider Notes (Signed)
 -----------------------------------------   10:44 AM on 01/15/2018 -----------------------------------------  Followed up on CT scan of the chest.  No acute issues.  It does show slight malposition of the Port-A-Cath tip in the innominate vein without evidence of thrombosis or obstruction.  Counseled patient on findings.  Attempted to contact her surgeon Dr. Faith Rogue but was unable to reach him.  Recommended she follow-up with them this week for further evaluation of the Port-A-Cath and her chest discomfort.  I doubt ACS PE or dissection.   Carrie Mew, MD 01/15/18 1045

## 2018-01-15 NOTE — Discharge Instructions (Signed)
Your CT scan of the chest shows that your port-a-cath is slightly malpositioned. It is still okay to use as usual, but you should follow up with Dr. Genevive Bi to see if it needs to be repositioned.  The scan was otherwise okay and did not show any acute issues.

## 2018-01-15 NOTE — ED Provider Notes (Signed)
[facility] Emergency Department Provider Note __________________________   First MD Initiated Contact with Patient 01/15/18 0600     (approximate)  I have reviewed the triage vital signs and the nursing notes.   HISTORY  Chief Complaint Pain at portacath site    HPI Maria Patterson is a 77 y.o. female with below list of chronic medical conditions including rest cancer status post left mastectomy and chemotherapy which patient received her last chemotherapy treatment in April presents emergency department with pain and "swelling around Port-A-Cath site.  Patient states that the area is tender to palpation.   Past Medical History:  Diagnosis Date  . Anemia   . Breast cancer ([place])   . Breast mass, left 09/22/2016   RECOMMENDATION: Ultrasound-guided core biopsies of masses at left breast 2 o'clock 2 cm from nipple, left breast 2 o'clock 5 cm from nipple, abnormal left axillary lymph nodes.  Marland Kitchen GERD (gastroesophageal reflux disease)   . Hyperglycemia   . Hyperlipidemia   . Obesity   . Postmenopausal     Patient Active Problem List   Diagnosis Date Noted  . Insomnia 09/26/2017  . Chronic renal disease, stage III ([place]) 08/09/2017  . Hypomagnesemia 05/04/2017  . Aortic atherosclerosis ([place]) 05/04/2017  . Compression fracture of first lumbar vertebra ([place]) 05/04/2017  . Degenerative disc disease, lumbar 05/04/2017  . Breast cancer, left ([place]) 04/14/2017  . Tachycardia 02/01/2017  . Anemia 02/01/2017  . Atherosclerosis of native arteries of extremity with intermittent claudication ([place]) 11/09/2016  . Allergic rhinitis 11/07/2016  . Malignant neoplasm of upper-outer quadrant of left female breast ([place]) 10/08/2016  . Breast mass, left 09/22/2016  . Encounter for screening for HIV 09/18/2016  . Osteoporosis screening 09/18/2016  . Postmenopausal 09/18/2016  . Ache in joint 03/09/2016  . Overweight 03/09/2016  . Medication monitoring encounter 10/30/2015    . GERD without esophagitis 07/01/2012  . Hyperlipidemia LDL goal <100 07/01/2012    Past Surgical History:  Procedure Laterality Date  . ABDOMINAL HYSTERECTOMY    . AXILLARY SENTINEL NODE BIOPSY Left 04/14/2017   Procedure: AXILLARY SENTINEL NODE BIOPSY;  Surgeon: Olean Ree, MD;  Location: [facility] ORS;  Service: General;  Laterality: Left;  . BREAST BIOPSY Left 09/30/2016   US biopsy of 3 areas + chemo  . BREAST EXCISIONAL BIOPSY Left 04/14/2017   Left breast invasive cancer left mastectomy  . ESOPHAGOGASTRODUODENOSCOPY (EGD) WITH PROPOFOL N/A 12/24/2014   Procedure: ESOPHAGOGASTRODUODENOSCOPY (EGD) WITH PROPOFOL;  Surgeon: Manya Silvas, MD;  Location: [facility] ENDOSCOPY;  Service: Endoscopy;  Laterality: N/A;  . MASTECTOMY Left 04/14/2017   Left breast invasive cancer  . PORTACATH PLACEMENT Right 10/27/2016   Procedure: INSERTION PORT-A-CATH;  Surgeon: Nestor Lewandowsky, MD;  Location: [facility] ORS;  Service: General;  Laterality: Right;  . TONSILECTOMY, ADENOIDECTOMY, BILATERAL MYRINGOTOMY AND TUBES    . TONSILLECTOMY    . TOTAL MASTECTOMY Left 04/14/2017   Procedure: TOTAL MASTECTOMY;  Surgeon: Olean Ree, MD;  Location: [facility] ORS;  Service: General;  Laterality: Left;    Prior to Admission medications   Medication Sig Start Date End Date Taking? Authorizing Provider  acetaminophen (TYLENOL) 500 MG tablet Take 1,000 mg by mouth every 6 (six) hours as needed for mild pain.    [provider]  aspirin 81 MG tablet Take 81 mg by mouth daily.    [provider]  atorvastatin (LIPITOR) 40 MG tablet Take 1 tablet (40 mg total) by mouth at bedtime. 10/27/17   Lada, Satira Anis,  MD  Cholecalciferol (VITAMIN D) 2000 units tablet Take 2,000 Units by mouth 3 (three) times a week. One by mouth twice a week 09/01/16   Arnetha Courser, MD  doxycycline (VIBRA-TABS) 100 MG tablet Take 1 tablet (100 mg total) by mouth 2 (two) times daily. 11/02/17   Arnetha Courser, MD  ferrous sulfate 325 (65  FE) MG EC tablet Take 325 mg by mouth 3 (three) times a week.  09/01/16   Arnetha Courser, MD  fluticasone (FLONASE) 50 MCG/ACT nasal spray Place 1 spray into both nostrils daily as needed for allergies or rhinitis. 09/20/17   Arnetha Courser, MD  Multiple Vitamin (MULTIVITAMIN WITH MINERALS) TABS tablet Take 1 tablet by mouth 3 (three) times a week.  09/01/16   Arnetha Courser, MD  Polyethyl Glycol-Propyl Glycol (SYSTANE OP) Place 1 drop into both eyes daily as needed (dry eyes).     [provider]  silver sulfADIAZINE (SILVADENE) 1 % cream Apply 1 application topically 2 (two) times daily. 08/03/17   Noreene Filbert, MD  traZODone (DESYREL) 50 MG tablet Take 0.5-1 tablets (25-50 mg total) by mouth at bedtime as needed for sleep. 11/02/17   Arnetha Courser, MD    Allergies Latex and Penicillins  Family History  Problem Relation Age of Onset  . Stroke Mother   . Stroke Father   . Cancer Neg Hx   . Depression Neg Hx     Social History Social History   Tobacco Use  . Smoking status: Never Smoker  . Smokeless tobacco: Never Used  Substance Use Topics  . Alcohol use: No    Alcohol/week: 0.0 oz  . Drug use: No    Review of Systems Constitutional: No fever/chills Eyes: No visual changes. ENT: No sore throat. Cardiovascular: Positive for chest wall pain Respiratory: Denies shortness of breath. Gastrointestinal: No abdominal pain.  No nausea, no vomiting.  No diarrhea.  No constipation. Genitourinary: Negative for dysuria. Musculoskeletal: Negative for neck pain.  Negative for back pain. Integumentary: Negative for rash. Neurological: Negative for headaches, focal weakness or numbness.   ____________________________________________   PHYSICAL EXAM:  VITAL SIGNS: ED Triage Vitals  Enc Vitals Group     BP 01/15/18 0557 135/63     Pulse Rate 01/15/18 0557 71     Resp 01/15/18 0557 16     Temp 01/15/18 0557 98.2 F (36.8 C)     Temp Source 01/15/18 0557 Oral     SpO2  01/15/18 0557 98 %     Weight 01/15/18 0548 77.1 kg (170 lb)     Height 01/15/18 0548 1.575 m (5\' 2" )     Head Circumference --      Peak Flow --      Pain Score 01/15/18 0548 4     Pain Loc --      Pain Edu? --      Excl. in [place]? --     Constitutional: Alert and oriented. Well appearing and in no acute distress. Eyes: Conjunctivae are normal.  Head: Atraumatic. Mouth/Throat: Mucous membranes are moist.  Oropharynx non-erythematous. Neck: No stridor.   Cardiovascular: Normal rate, regular rhythm. Good peripheral circulation. Grossly normal heart sounds. Respiratory: Normal respiratory effort.  No retractions. Lungs CTAB. Gastrointestinal: Soft and nontender. No distention.  Musculoskeletal: No lower extremity tenderness nor edema. No gross deformities of extremities. Neurologic:  Normal speech and language. No gross focal neurologic deficits are appreciated.  Skin:  Skin is warm, dry and intact. No  rash noted. Psychiatric: Mood and affect are normal. Speech and behavior are normal.  ____________________________________________   LABS (all labs ordered are listed, but only abnormal results are displayed)  Labs Reviewed  CBC - Abnormal; Notable for the following components:      Result Value   RBC 3.53 (*)    Hemoglobin 11.3 (*)    HCT 32.9 (*)    RDW 14.8 (*)    All other components within normal limits  COMPREHENSIVE METABOLIC PANEL - Abnormal; Notable for the following components:   Chloride 113 (*)    CO2 21 (*)    BUN 25 (*)    Creatinine, Ser 1.16 (*)    GFR calc non Af Amer 44 (*)    GFR calc Af Amer 51 (*)    All other components within normal limits  TROPONIN I   ____________________________________________  EKG  ED ECG REPORT I, [place] N BROWN, the attending physician, personally viewed and interpreted this ECG.   Date: 01/15/2018  EKG Time: 6:18AM  Rate: 69  Rhythm: Normal Sinus Rhythm  Axis: Normal  Intervals:Normal  ST&T Change:  None     Procedures   ____________________________________________   INITIAL IMPRESSION / ASSESSMENT AND PLAN / ED COURSE  As part of my medical decision making, I reviewed the following data within the electronic MEDICAL RECORD NUMBER {  77 year old female presenting with above-stated history and physical exam secondary to pain and swelling at her Port-A-Cath site.  No overlying skin changes consistent with infection no palpable fluid collection noted on exam.  CT scan of chest ordered to evaluate for possible signs of infection including fluid collection etc..  If CT scan is unremarkable suspect patient's pain to be secondary to chronic chest wall discomfort which is documented in previous notes by oncology    ____________________________________________  FINAL CLINICAL IMPRESSION(S) / ED DIAGNOSES  Final diagnoses:  Nonspecific chest pain     MEDICATIONS GIVEN DURING THIS VISIT:  Medications  ondansetron (ZOFRAN) injection 4 mg (4 mg Intravenous Given 01/15/18 7494)     ED Discharge Orders    None       Note:  This document was prepared using Dragon voice recognition software and may include unintentional dictation errors.    Gregor Hams, MD 01/15/18 2237

## 2018-01-15 NOTE — ED Triage Notes (Addendum)
Pt arrives to ED via POV from home with c/o pain at her Portacath site x2 days. Pt states it got better over the course of the day yesterday, but the pain returned this morning. Pt reports some nausea, but denies vomiting. Pt reports last chemo t/x was in April for breast CA.

## 2018-01-27 ENCOUNTER — Telehealth: Payer: Self-pay | Admitting: Cardiothoracic Surgery

## 2018-01-27 ENCOUNTER — Ambulatory Visit (INDEPENDENT_AMBULATORY_CARE_PROVIDER_SITE_OTHER): Payer: Medicare Other

## 2018-01-27 VITALS — BP 128/72 | HR 73 | Temp 98.8°F | Resp 14 | Ht 61.0 in | Wt 168.9 lb

## 2018-01-27 DIAGNOSIS — Z171 Estrogen receptor negative status [ER-]: Secondary | ICD-10-CM | POA: Diagnosis not present

## 2018-01-27 DIAGNOSIS — Z0000 Encounter for general adult medical examination without abnormal findings: Secondary | ICD-10-CM

## 2018-01-27 DIAGNOSIS — Z9181 History of falling: Secondary | ICD-10-CM

## 2018-01-27 DIAGNOSIS — Z594 Lack of adequate food and safe drinking water: Secondary | ICD-10-CM | POA: Diagnosis not present

## 2018-01-27 DIAGNOSIS — C50412 Malignant neoplasm of upper-outer quadrant of left female breast: Secondary | ICD-10-CM | POA: Diagnosis not present

## 2018-01-27 DIAGNOSIS — Z599 Problem related to housing and economic circumstances, unspecified: Secondary | ICD-10-CM

## 2018-01-27 DIAGNOSIS — Z598 Other problems related to housing and economic circumstances: Secondary | ICD-10-CM

## 2018-01-27 DIAGNOSIS — Z5941 Food insecurity: Secondary | ICD-10-CM

## 2018-01-27 NOTE — Telephone Encounter (Signed)
Patients coming in on 7/19.

## 2018-01-27 NOTE — Progress Notes (Signed)
Subjective:   Maria Patterson is a 77 y.o. female who presents for Medicare Annual (Subsequent) preventive examination.  Review of Systems:  N/A Cardiac Risk Factors include: advanced age (>60men, >74 women);dyslipidemia;sedentary lifestyle     Objective:     Vitals: BP 128/72 (BP Location: Right Arm, Patient Position: Sitting, Cuff Size: Normal)   Pulse 73   Temp 98.8 F (37.1 C) (Oral)   Resp 14   Ht 5\' 1"  (1.549 m)   Wt 168 lb 14.4 oz (76.6 kg)   SpO2 96%   BMI 31.91 kg/m   Body mass index is 31.91 kg/m.   Pt c/o sinus congestion, sore throat and dry cough. Advised pt to schedule an appt with Dr. Sanda Klein for further evaluation.  Advanced Directives 01/27/2018 09/23/2017 09/22/2017 08/11/2017 07/14/2017 06/03/2017 05/04/2017  Does Patient Have a Medical Advance Directive? No No No No No No No  Would patient like information on creating a medical advance directive? Yes (MAU/Ambulatory/Procedural Areas - Information given) - No - Patient declined - No - Patient declined No - Patient declined -    Tobacco Social History   Tobacco Use  Smoking Status Never Smoker  Smokeless Tobacco Never Used  Tobacco Comment   smoking cessation materials not required     Counseling given: No Comment: smoking cessation materials not required  Clinical Intake:  Pre-visit preparation completed: Yes  Pain : No/denies pain   BMI - recorded: 31.91 Nutritional Status: BMI > 30  Obese Nutritional Risks: None Diabetes: No  How often do you need to have someone help you when you read instructions, pamphlets, or other written materials from your doctor or pharmacy?: 1 - Never  Interpreter Needed?: No  Information entered by :: AEversole, LPN  Past Medical History:  Diagnosis Date  . Anemia   . Breast cancer ([place])   . Breast mass, left 09/22/2016   RECOMMENDATION: Ultrasound-guided core biopsies of masses at left breast 2 o'clock 2 cm from nipple, left breast 2 o'clock 5 cm from nipple,  abnormal left axillary lymph nodes.  Marland Kitchen GERD (gastroesophageal reflux disease)   . Hyperglycemia   . Hyperlipidemia   . Obesity   . Postmenopausal    Past Surgical History:  Procedure Laterality Date  . ABDOMINAL HYSTERECTOMY    . AXILLARY SENTINEL NODE BIOPSY Left 04/14/2017   Procedure: AXILLARY SENTINEL NODE BIOPSY;  Surgeon: Olean Ree, MD;  Location: [facility] ORS;  Service: General;  Laterality: Left;  . BREAST BIOPSY Left 09/30/2016   US biopsy of 3 areas + chemo  . BREAST EXCISIONAL BIOPSY Left 04/14/2017   Left breast invasive cancer left mastectomy  . ESOPHAGOGASTRODUODENOSCOPY (EGD) WITH PROPOFOL N/A 12/24/2014   Procedure: ESOPHAGOGASTRODUODENOSCOPY (EGD) WITH PROPOFOL;  Surgeon: Manya Silvas, MD;  Location: [facility] ENDOSCOPY;  Service: Endoscopy;  Laterality: N/A;  . MASTECTOMY Left 04/14/2017   Left breast invasive cancer  . PORTACATH PLACEMENT Right 10/27/2016   Procedure: INSERTION PORT-A-CATH;  Surgeon: Nestor Lewandowsky, MD;  Location: [facility] ORS;  Service: General;  Laterality: Right;  . TONSILECTOMY, ADENOIDECTOMY, BILATERAL MYRINGOTOMY AND TUBES    . TONSILLECTOMY    . TOTAL MASTECTOMY Left 04/14/2017   Procedure: TOTAL MASTECTOMY;  Surgeon: Olean Ree, MD;  Location: [facility] ORS;  Service: General;  Laterality: Left;   Family History  Problem Relation Age of Onset  . Stroke Mother   . Stroke Father   . Cancer Neg Hx   . Depression Neg Hx    Social History   Socioeconomic History  .  Marital status: Widowed    Spouse name: Not on file  . Number of children: 1  . Years of education: GED  . Highest education level: 12th grade  Occupational History  . Occupation: Retired  Scientific laboratory technician  . Financial resource strain: Somewhat hard  . Food insecurity:    Worry: Sometimes true    Inability: Sometimes true  . Transportation needs:    Medical: No    Non-medical: No  Tobacco Use  . Smoking status: Never Smoker  . Smokeless tobacco: Never Used  . Tobacco comment:  smoking cessation materials not required  Substance and Sexual Activity  . Alcohol use: No    Alcohol/week: 0.0 oz  . Drug use: No  . Sexual activity: Not Currently  Lifestyle  . Physical activity:    Days per week: 0 days    Minutes per session: 0 min  . Stress: Not at all  Relationships  . Social connections:    Talks on phone: Patient refused    Gets together: Patient refused    Attends religious service: Patient refused    Active member of club or organization: Patient refused    Attends meetings of clubs or organizations: Patient refused    Relationship status: Widowed  Other Topics Concern  . Not on file  Social History Narrative  . Not on file   Pt states she has financial strains with water and electric bill. Also states it is difficult to afford food. After further discussion with pt, also learned she is no longer able to care for her lawn or maintain exterior of home. States her front porch stairs and back porch are in need of repair. Boards are rotting and has caused her to fall a few times. At risk for injury related fall, therefore, C3 referral placed to assist with these strains.   Outpatient Encounter Medications as of 01/27/2018  Medication Sig  . acetaminophen (TYLENOL) 325 MG tablet Take 2 tablets (650 mg total) by mouth every 6 (six) hours as needed.  Marland Kitchen aspirin 81 MG tablet Take 81 mg by mouth daily.  . Cholecalciferol (VITAMIN D) 2000 units tablet Take 2,000 Units by mouth 3 (three) times a week. One by mouth twice a week  . ferrous sulfate 325 (65 FE) MG EC tablet Take 325 mg by mouth 3 (three) times a week.   . fluticasone (FLONASE) 50 MCG/ACT nasal spray Place 1 spray into both nostrils daily as needed for allergies or rhinitis.  . Multiple Vitamin (MULTIVITAMIN WITH MINERALS) TABS tablet Take 1 tablet by mouth 3 (three) times a week.   . naproxen (NAPROSYN) 250 MG tablet Take 1 tablet (250 mg total) by mouth 2 (two) times daily with a meal.  . ondansetron  (ZOFRAN ODT) 4 MG disintegrating tablet Take 1 tablet (4 mg total) by mouth every 8 (eight) hours as needed for nausea or vomiting.  Vladimir Faster Glycol-Propyl Glycol (SYSTANE OP) Place 1 drop into both eyes daily as needed (dry eyes).   . silver sulfADIAZINE (SILVADENE) 1 % cream Apply 1 application topically 2 (two) times daily.  Marland Kitchen atorvastatin (LIPITOR) 40 MG tablet Take 1 tablet (40 mg total) by mouth at bedtime. (Patient not taking: Reported on 01/27/2018)  . doxycycline (VIBRA-TABS) 100 MG tablet Take 1 tablet (100 mg total) by mouth 2 (two) times daily.  . traZODone (DESYREL) 50 MG tablet Take 0.5-1 tablets (25-50 mg total) by mouth at bedtime as needed for sleep.   No facility-administered encounter medications on  file as of 01/27/2018.    Pt has discontinued use of Lipitor stating this is causing muscle aches and pains. Advised I would inform Dr. Sanda Klein about her choice to discontinue this medication.  Activities of Daily Living In your present state of health, do you have any difficulty performing the following activities: 01/27/2018 11/02/2017  Hearing? N N  Comment denies hearing aids -  Vision? N N  Comment wears eyeglasses -  Difficulty concentrating or making decisions? N N  Walking or climbing stairs? Y N  Comment fatigue -  Dressing or bathing? N N  Doing errands, shopping? N N  Preparing Food and eating ? N -  Comment denies dentures -  Using the Toilet? N -  In the past six months, have you accidently leaked urine? N -  Do you have problems with loss of bowel control? N -  Managing your Medications? N -  Managing your Finances? N -  Housekeeping or managing your Housekeeping? N -  Some recent data might be hidden    Patient Care Team: Lada, Satira Anis, MD as PCP - General (Family Medicine) Theodore Demark, RN as Oncology Nurse Navigator Clayburn Pert, MD as Consulting Physician (General Surgery) Lloyd Huger, MD as Consulting Physician (Oncology) Olean Ree, MD as Consulting Physician (Surgery) Noreene Filbert, MD as Referring Physician (Radiation Oncology)    Assessment:   This is a routine wellness examination for Tasnia.  Exercise Activities and Dietary recommendations Current Exercise Habits: The patient does not participate in regular exercise at present, Exercise limited by: None identified  Goals    . DIET - INCREASE WATER INTAKE     Recommend to drink at least 6-8 8oz glasses of water per day.       Fall Risk Fall Risk  01/27/2018 11/02/2017 09/23/2017 09/20/2017 08/09/2017  Falls in the past year? Yes No No No No  Comment back porch needs repaired. [place] [address] gaining access to [employer] - - - -  Number falls in past yr: 1 - - - -  Injury with Fall? No - - - -  Risk for fall due to : Impaired vision;Impaired balance/gait;Other (Comment);History of fall(s);Medication side effect - - - -  Risk for fall due to: Comment wears eyeglasses; fatigue - - - -  Follow up Falls evaluation completed;Education provided;Falls prevention discussed - - - -   FALL RISK PREVENTION PERTAINING TO HOME: Is your home free of loose throw rugs in walkways, pet beds, electrical cords, etc? Yes Is there adequate lighting in your home to reduce risk of falls?  Yes Are there stairs in or around your home WITH handrails? Yes  ASSISTIVE DEVICES UTILIZED TO PREVENT FALLS: Use of a cane, walker or w/c? No Grab bars in the bathroom? No  Shower chair or a place to sit while bathing? No An elevated toilet seat or a handicapped toilet? No  Timed Get Up and Go Performed: Yes. Pt ambulated 10 feet within 11 sec. Gait slow, steady, shuffling-like and without the use of an assistive device. No intervention required at this time. Fall risk prevention has been discussed.  Community Resource Referral:  Pt declined my offer to send [employer] Referral to Care Guide for a shower chair. However, pt requested Community Resource Referral be sent to Care Guide for  installation of grab bars in the shower and a handicapped toilet.  Depression Screen PHQ 2/9 Scores 01/27/2018 11/02/2017 09/23/2017 09/20/2017  PHQ - 2 Score 2 1 0 0  PHQ- 9 Score 3 - - -     Cognitive Function     6CIT Screen 01/27/2018 09/18/2016  What Year? 0 points 0 points  What month? 0 points 0 points  What time? 0 points 0 points  Count back from 20 0 points 0 points  Months in reverse 0 points 0 points  Repeat phrase 6 points 2 points  Total Score 6 2    Immunization History  Administered Date(s) Administered  . Influenza, High Dose Seasonal PF 09/10/2015, 09/01/2016, 04/15/2017  . Pneumococcal Conjugate-13 09/10/2015  . Pneumococcal Polysaccharide-23 05/04/2017    Qualifies for Shingles Vaccine? Yes. Due for Shingrix. Education has been provided regarding the importance of this vaccine. Pt has been advised to call her insurance company to determine her out of pocket expense. Advised she may also receive this vaccine at her local pharmacy or Health Dept. Verbalized acceptance and understanding.  Due for Tdap vaccine. Education has been provided regarding the importance of this vaccine. Pt has been advised she may receive this vaccine at her local pharmacy or Health Dept. Also advised to provide a copy of her vaccination record if she chooses to receive this vaccine at her local pharmacy. Verbalized acceptance and understanding.  Screening Tests Health Maintenance  Topic Date Due  . TETANUS/TDAP  12/29/2018 (Originally 07/06/1960)  . INFLUENZA VACCINE  02/17/2018  . MAMMOGRAM  09/25/2018  . DEXA SCAN  Completed  . PNA vac Low Risk Adult  Completed    Cancer Screenings: Lung: Low Dose CT Chest recommended if Age 70-80 years, 30 pack-year currently smoking OR have quit w/in 15years. Patient does not qualify. Breast:  Up to date on Mammogram? Yes. Completed 09/24/17. Repeat every year   Up to date of Bone Density/Dexa? No. Completed 12/12/14. Repeat every 2 years. Ordered  05/04/17 but not yet completed. Pt states she has not wanted to proceed with this screening at this time d/t financial reasons. Further states that Dr. Grayland Ormond has been advising pt to complete this exam. Will continue to allow Dr. Grayland Ormond to manage at this time. Colorectal: No longer required  Additional Screenings: Hepatitis C Screening: Does not qualify    Plan:  I have personally reviewed and addressed the Medicare Annual Wellness questionnaire and have noted the following in the patient's chart:  A. Medical and social history B. Use of alcohol, tobacco or illicit drugs  C. Current medications and supplements D. Functional ability and status E.  Nutritional status F.  Physical activity G. Advance directives H. List of other physicians I.  Hospitalizations, surgeries, and ER visits in previous 12 months J.  [place] such as hearing and vision if needed, cognitive and depression L. Referrals and appointments  In addition, I have reviewed and discussed with patient certain preventive protocols, quality metrics, and best practice recommendations. A written personalized care plan for preventive services as well as general preventive health recommendations were provided to patient.  See attached scanned questionnaire for additional information.   Signed,  Aleatha Borer, LPN Nurse Health Advisor

## 2018-01-27 NOTE — Telephone Encounter (Signed)
-----   Message from Mickie Kay sent at 01/27/2018  1:54 PM EDT ----- Regarding: ed referral Established pt with [facility]. Should see [facility] to follow up on visit from ED for pain at port site at his next available appt.

## 2018-01-27 NOTE — Patient Instructions (Addendum)
Maria Patterson , Thank you for taking time to come for your Medicare Wellness Visit. I appreciate your ongoing commitment to your health goals. Please review the following plan we discussed and let me know if I can assist you in the future.   Screening recommendations/referrals: Colorectal Screening: No longer required Mammogram: Up to date Bone Density: Dr. Grayland Ormond to manage this screening Lung Cancer Screening: You do not qualify for this screening Hepatitis C Screening: You do not qualify for this screening  Vision and Dental Exams: Recommended annual ophthalmology exams for early detection of glaucoma and other disorders of the eye Recommended annual dental exams for proper oral hygiene  Vaccinations: Influenza vaccine: Up to date Pneumococcal vaccine: Up to date Tdap vaccine: Declined. Please call your insurance company to determine your out of pocket expense. You may also receive this vaccine at your local pharmacy or Health Dept. Shingles vaccine: Please call your insurance company to determine your out of pocket expense for the Shingrix vaccine. You may also receive this vaccine at your local pharmacy or Health Dept.  Advanced directives: Advance directive discussed with you today. I have provided a copy for you to complete at home and have notarized. Once this is complete please bring a copy in to our office so we can scan it into your chart.  Goals: Recommend to drink at least 6-8 8oz glasses of water per day.  Next appointment: Please schedule your Annual Wellness Visit with your Nurse Health Advisor in one year.  Preventive Care 31 Years and Older, Female Preventive care refers to lifestyle choices and visits with your health care provider that can promote health and wellness. What does preventive care include?  A yearly physical exam. This is also called an annual well check.  Dental exams once or twice a year.  Routine eye exams. Ask your health care provider how often you  should have your eyes checked.  Personal lifestyle choices, including:  Daily care of your teeth and gums.  Regular physical activity.  Eating a healthy diet.  Avoiding tobacco and drug use.  Limiting alcohol use.  Practicing safe sex.  Taking low-dose aspirin every day.  Taking vitamin and mineral supplements as recommended by your health care provider. What happens during an annual well check? The services and screenings done by your health care provider during your annual well check will depend on your age, overall health, lifestyle risk factors, and family history of disease. Counseling  Your health care provider may ask you questions about your:  Alcohol use.  Tobacco use.  Drug use.  Emotional well-being.  Home and relationship well-being.  Sexual activity.  Eating habits.  History of falls.  Memory and ability to understand (cognition).  Work and work Statistician.  Reproductive health. Screening  You may have the following tests or measurements:  Height, weight, and BMI.  Blood pressure.  Lipid and cholesterol levels. These may be checked every 5 years, or more frequently if you are over 38 years old.  Skin check.  Lung cancer screening. You may have this screening every year starting at age 80 if you have a 30-pack-year history of smoking and currently smoke or have quit within the past 15 years.  Fecal occult blood test (FOBT) of the stool. You may have this test every year starting at age 2.  Flexible sigmoidoscopy or colonoscopy. You may have a sigmoidoscopy every 5 years or a colonoscopy every 10 years starting at age 55.  Hepatitis C blood test.  Hepatitis B blood test.  Sexually transmitted disease (STD) testing.  Diabetes screening. This is done by checking your blood sugar (glucose) after you have not eaten for a while (fasting). You may have this done every 1-3 years.  Bone density scan. This is done to screen for osteoporosis.  You may have this done starting at age 16.  Mammogram. This may be done every 1-2 years. Talk to your health care provider about how often you should have regular mammograms. Talk with your health care provider about your test results, treatment options, and if necessary, the need for more tests. Vaccines  Your health care provider may recommend certain vaccines, such as:  Influenza vaccine. This is recommended every year.  Tetanus, diphtheria, and acellular pertussis (Tdap, Td) vaccine. You may need a Td booster every 10 years.  Zoster vaccine. You may need this after age 106.  Pneumococcal 13-valent conjugate (PCV13) vaccine. One dose is recommended after age 41.  Pneumococcal polysaccharide (PPSV23) vaccine. One dose is recommended after age 60. Talk to your health care provider about which screenings and vaccines you need and how often you need them. This information is not intended to replace advice given to you by your health care provider. Make sure you discuss any questions you have with your health care provider. Document Released: 08/02/2015 Document Revised: 03/25/2016 Document Reviewed: 05/07/2015 Elsevier Interactive Patient Education  2017 [place] Prevention in the Home Falls can cause injuries. They can happen to people of all ages. There are many things you can do to make your home safe and to help prevent falls. What can I do on the outside of my home?  Regularly fix the edges of walkways and driveways and fix any cracks.  Remove anything that might make you trip as you walk through a door, such as a raised step or threshold.  Trim any bushes or trees on the path to your home.  Use bright outdoor lighting.  Clear any walking paths of anything that might make someone trip, such as rocks or tools.  Regularly check to see if handrails are loose or broken. Make sure that both sides of any steps have handrails.  Any raised decks and porches should have  guardrails on the edges.  Have any leaves, snow, or ice cleared regularly.  Use sand or salt on walking paths during winter.  Clean up any spills in your garage right away. This includes oil or grease spills. What can I do in the bathroom?  Use night lights.  Install grab bars by the toilet and in the tub and shower. Do not use towel bars as grab bars.  Use non-skid mats or decals in the tub or shower.  If you need to sit down in the shower, use a plastic, non-slip stool.  Keep the floor dry. Clean up any water that spills on the floor as soon as it happens.  Remove soap buildup in the tub or shower regularly.  Attach bath mats securely with double-sided non-slip rug tape.  Do not have throw rugs and other things on the floor that can make you trip. What can I do in the bedroom?  Use night lights.  Make sure that you have a light by your bed that is easy to reach.  Do not use any sheets or blankets that are too big for your bed. They should not hang down onto the floor.  Have a firm chair that has side arms. You can use this  for support while you get dressed.  Do not have throw rugs and other things on the floor that can make you trip. What can I do in the kitchen?  Clean up any spills right away.  Avoid walking on wet floors.  Keep items that you use a lot in easy-to-reach places.  If you need to reach something above you, use a strong step stool that has a grab bar.  Keep electrical cords out of the way.  Do not use floor polish or wax that makes floors slippery. If you must use wax, use non-skid floor wax.  Do not have throw rugs and other things on the floor that can make you trip. What can I do with my stairs?  Do not leave any items on the stairs.  Make sure that there are handrails on both sides of the stairs and use them. Fix handrails that are broken or loose. Make sure that handrails are as long as the stairways.  Check any carpeting to make sure that  it is firmly attached to the stairs. Fix any carpet that is loose or worn.  Avoid having throw rugs at the top or bottom of the stairs. If you do have throw rugs, attach them to the floor with carpet tape.  Make sure that you have a light switch at the top of the stairs and the bottom of the stairs. If you do not have them, ask someone to add them for you. What else can I do to help prevent falls?  Wear shoes that:  Do not have high heels.  Have rubber bottoms.  Are comfortable and fit you well.  Are closed at the toe. Do not wear sandals.  If you use a stepladder:  Make sure that it is fully opened. Do not climb a closed stepladder.  Make sure that both sides of the stepladder are locked into place.  Ask someone to hold it for you, if possible.  Clearly mark and make sure that you can see:  Any grab bars or handrails.  First and last steps.  Where the edge of each step is.  Use tools that help you move around (mobility aids) if they are needed. These include:  Canes.  Walkers.  Scooters.  Crutches.  Turn on the lights when you go into a dark area. Replace any light bulbs as soon as they burn out.  Set up your furniture so you have a clear path. Avoid moving your furniture around.  If any of your floors are uneven, fix them.  If there are any pets around you, be aware of where they are.  Review your medicines with your doctor. Some medicines can make you feel dizzy. This can increase your chance of falling. Ask your doctor what other things that you can do to help prevent falls. This information is not intended to replace advice given to you by your health care provider. Make sure you discuss any questions you have with your health care provider. Document Released: 05/02/2009 Document Revised: 12/12/2015 Document Reviewed: 08/10/2014 Elsevier Interactive Patient Education  2017 [employer].

## 2018-01-28 ENCOUNTER — Encounter: Payer: Self-pay | Admitting: Nurse Practitioner

## 2018-01-28 ENCOUNTER — Ambulatory Visit (INDEPENDENT_AMBULATORY_CARE_PROVIDER_SITE_OTHER): Payer: Medicare Other | Admitting: Nurse Practitioner

## 2018-01-28 VITALS — BP 140/80 | HR 65 | Temp 98.4°F | Resp 16 | Ht 61.0 in | Wt 167.0 lb

## 2018-01-28 DIAGNOSIS — J3089 Other allergic rhinitis: Secondary | ICD-10-CM

## 2018-01-28 DIAGNOSIS — J029 Acute pharyngitis, unspecified: Secondary | ICD-10-CM | POA: Diagnosis not present

## 2018-01-28 MED ORDER — SALINE SPRAY 0.65 % NA SOLN
1.0000 | NASAL | 0 refills | Status: DC | PRN
Start: 2018-01-28 — End: 2018-03-08

## 2018-01-28 MED ORDER — MAGIC MOUTHWASH W/LIDOCAINE: 5.0000 mL | Freq: Two times a day (BID) | ORAL | 0 refills | Status: DC

## 2018-01-28 MED ORDER — LEVOCETIRIZINE DIHYDROCHLORIDE 5 MG PO TABS: 2.5000 mg | ORAL_TABLET | Freq: Every evening | ORAL | 0 refills | Status: DC

## 2018-01-28 NOTE — Patient Instructions (Signed)
-   if symptoms are not improving with medicine within a week or are worsening at any point call and let us know Allergies An allergy is when your body reacts to a substance in a way that is not normal. An allergic reaction can happen after you:  Eat something.  Breathe in something.  Touch something.  You can be allergic to:  Things that are only around during certain seasons, like molds and pollens.  Foods.  Drugs.  Insects.  Animal dander.  What are the signs or symptoms?  Puffiness (swelling). This may happen on the lips, face, tongue, mouth, or throat.  Sneezing.  Coughing.  Breathing loudly (wheezing).  Stuffy nose.  Tingling in the mouth.  A rash.  Itching.  Itchy, red, puffy areas of skin (hives).  Watery eyes.  Throwing up (vomiting).  Watery poop (diarrhea).  Dizziness.  Feeling faint or fainting.  Trouble breathing or swallowing.  A tight feeling in the chest.  A fast heartbeat. How is this diagnosed? Allergies can be diagnosed with:  A medical and family history.  Skin tests.  Blood tests.  A food diary. A food diary is a record of all the foods, drinks, and symptoms you have each day.  The results of an elimination diet. This diet involves making sure not to eat certain foods and then seeing what happens when you start eating them again.  How is this treated? There is no cure for allergies, but allergic reactions can be treated with medicine. Severe reactions usually need to be treated at a hospital. How is this prevented? The best way to prevent an allergic reaction is to avoid the thing you are allergic to. Allergy shots and medicines can also help prevent reactions in some cases. This information is not intended to replace advice given to you by your health care provider. Make sure you discuss any questions you have with your health care provider. Document Released: 10/31/2012 Document Revised: 03/02/2016 Document Reviewed:  04/17/2014 Elsevier Interactive Patient Education  [employer].

## 2018-01-28 NOTE — Progress Notes (Addendum)
Name: Maria Patterson   MRN: [id number]    DOB: 02/04/1941   Date:01/28/2018       Progress Note  Subjective  Chief Complaint  Chief Complaint  Patient presents with  . Sore Throat    She complains of burning and soreness in her throat x 1 week. She has been using Chloraspectic spray to treat symptoms.  . Headache    She has had a headache x 1 week. Headache is frontal.    HPI  Patient endorses sore throat, runny nose, sneezing, dry cough, itchy eyes and headache ongoing for one week. States usually gets this around this time of year and again in the winter. States normally passes on its own but throat is worse then. Denies sinus pain but states feels dry up in her nose and face.   No fevers, chills, shortness of breath, or chest pain.   Patient Active Problem List   Diagnosis Date Noted  . Insomnia 09/26/2017  . Chronic renal disease, stage III ([place]) 08/09/2017  . Hypomagnesemia 05/04/2017  . Aortic atherosclerosis ([place]) 05/04/2017  . Compression fracture of first lumbar vertebra ([place]) 05/04/2017  . Degenerative disc disease, lumbar 05/04/2017  . Breast cancer, left ([place]) 04/14/2017  . Tachycardia 02/01/2017  . Anemia 02/01/2017  . Atherosclerosis of native arteries of extremity with intermittent claudication ([place]) 11/09/2016  . Allergic rhinitis 11/07/2016  . Malignant neoplasm of upper-outer quadrant of left female breast ([place]) 10/08/2016  . Breast mass, left 09/22/2016  . Encounter for screening for HIV 09/18/2016  . Osteoporosis screening 09/18/2016  . Postmenopausal 09/18/2016  . Ache in joint 03/09/2016  . Overweight 03/09/2016  . Medication monitoring encounter 10/30/2015  . GERD without esophagitis 07/01/2012  . Hyperlipidemia LDL goal <100 07/01/2012    Past Medical History:  Diagnosis Date  . Anemia   . Breast cancer ([place])   . Breast mass, left 09/22/2016   RECOMMENDATION: Ultrasound-guided core biopsies of masses at left breast 2 o'clock 2 cm from nipple,  left breast 2 o'clock 5 cm from nipple, abnormal left axillary lymph nodes.  Marland Kitchen GERD (gastroesophageal reflux disease)   . Hyperglycemia   . Hyperlipidemia   . Obesity   . Postmenopausal     Past Surgical History:  Procedure Laterality Date  . ABDOMINAL HYSTERECTOMY    . AXILLARY SENTINEL NODE BIOPSY Left 04/14/2017   Procedure: AXILLARY SENTINEL NODE BIOPSY;  Surgeon: Olean Ree, MD;  Location: [facility] ORS;  Service: General;  Laterality: Left;  . BREAST BIOPSY Left 09/30/2016   US biopsy of 3 areas + chemo  . BREAST EXCISIONAL BIOPSY Left 04/14/2017   Left breast invasive cancer left mastectomy  . ESOPHAGOGASTRODUODENOSCOPY (EGD) WITH PROPOFOL N/A 12/24/2014   Procedure: ESOPHAGOGASTRODUODENOSCOPY (EGD) WITH PROPOFOL;  Surgeon: Manya Silvas, MD;  Location: [facility] ENDOSCOPY;  Service: Endoscopy;  Laterality: N/A;  . MASTECTOMY Left 04/14/2017   Left breast invasive cancer  . PORTACATH PLACEMENT Right 10/27/2016   Procedure: INSERTION PORT-A-CATH;  Surgeon: Nestor Lewandowsky, MD;  Location: [facility] ORS;  Service: General;  Laterality: Right;  . TONSILECTOMY, ADENOIDECTOMY, BILATERAL MYRINGOTOMY AND TUBES    . TONSILLECTOMY    . TOTAL MASTECTOMY Left 04/14/2017   Procedure: TOTAL MASTECTOMY;  Surgeon: Olean Ree, MD;  Location: [facility] ORS;  Service: General;  Laterality: Left;    Social History   Tobacco Use  . Smoking status: Never Smoker  . Smokeless tobacco: Never Used  . Tobacco comment: smoking cessation materials not required  Substance Use Topics  .  Alcohol use: No    Alcohol/week: 0.0 oz     Current Outpatient Medications:  .  acetaminophen (TYLENOL) 325 MG tablet, Take 2 tablets (650 mg total) by mouth every 6 (six) hours as needed., Disp: 60 tablet, Rfl: 0 .  aspirin 81 MG tablet, Take 81 mg by mouth daily., Disp: , Rfl:  .  Cholecalciferol (VITAMIN D) 2000 units tablet, Take 2,000 Units by mouth 3 (three) times a week. One by mouth twice a week, Disp: , Rfl:  .  ferrous  sulfate 325 (65 FE) MG EC tablet, Take 325 mg by mouth 3 (three) times a week. , Disp: , Rfl:  .  fluticasone (FLONASE) 50 MCG/ACT nasal spray, Place 1 spray into both nostrils daily as needed for allergies or rhinitis., Disp: 16 g, Rfl: 11 .  Multiple Vitamin (MULTIVITAMIN WITH MINERALS) TABS tablet, Take 1 tablet by mouth 3 (three) times a week. , Disp: , Rfl:  .  naproxen (NAPROSYN) 250 MG tablet, Take 1 tablet (250 mg total) by mouth 2 (two) times daily with a meal., Disp: 40 tablet, Rfl: 0 .  ondansetron (ZOFRAN ODT) 4 MG disintegrating tablet, Take 1 tablet (4 mg total) by mouth every 8 (eight) hours as needed for nausea or vomiting., Disp: 20 tablet, Rfl: 0 .  Polyethyl Glycol-Propyl Glycol (SYSTANE OP), Place 1 drop into both eyes daily as needed (dry eyes). , Disp: , Rfl:  .  silver sulfADIAZINE (SILVADENE) 1 % cream, Apply 1 application topically 2 (two) times daily., Disp: 50 g, Rfl: 3 .  traZODone (DESYREL) 50 MG tablet, Take 0.5-1 tablets (25-50 mg total) by mouth at bedtime as needed for sleep. (Patient not taking: Reported on 01/28/2018), Disp: 30 tablet, Rfl: 3  Allergies  Allergen Reactions  . Latex Itching  . Penicillins Itching and Rash    Has patient had a PCN reaction causing immediate rash, facial/tongue/throat swelling, SOB or lightheadedness with hypotension: Yes Has patient had a PCN reaction causing severe rash involving mucus membranes or skin necrosis: No Has patient had a PCN reaction that required hospitalization No Has patient had a PCN reaction occurring within the last 10 years: No If all of the above answers are "NO", then may proceed with Cephalosporin use.     ROS  No other specific complaints in a complete review of systems (except as listed in HPI above).  Objective  Vitals:   01/28/18 0835  BP: 140/80  Pulse: 65  Resp: 16  Temp: 98.4 F (36.9 C)  SpO2: 99%  Weight: 167 lb (75.8 kg)  Height: 5\' 1"  (1.549 m)    Body mass index is 31.55  kg/m.  Nursing Note and Vital Signs reviewed.  Physical Exam  Constitutional: Patient appears well-developed and well-nourished.  No distress.  HEENT: head atraumatic, normocephalic, pupils equal and reactive to light, TM's without erythema or bulging,  no maxillary or frontal sinus tenderness , neck supple without lymphadenopathy, oropharynx mildly irritated and moist without exudate, no nasal discharge- nares dry  Cardiovascular: Normal rate, regular rhythm, S1/S2 present.   Pulmonary/Chest: Effort normal and breath sounds clear. No respiratory distress or retractions. Psychiatric: Patient has a normal mood and affect. behavior is normal. Judgment and thought content normal.  No results found for this or any previous visit (from the past 72 hour(s)).  Assessment & Plan  1. Non-seasonal allergic rhinitis, unspecified trigger - if unimproved may consider antibiotics but appears to be allergic in nature.  - sodium chloride (OCEAN) 0.65 %  SOLN nasal spray; Place 1 spray into both nostrils as needed for congestion.  Dispense: 60 mL; Refill: 0 - levocetirizine (XYZAL) 5 MG tablet; Take 0.5 tablets (2.5 mg total) by mouth every evening.  Dispense: 30 tablet; Refill: 0  2. Sore throat - magic mouthwash w/lidocaine SOLN; Take 5 mLs by mouth 2 (two) times daily.  Dispense: 30 mL; Refill: 0  -Red flags and when to present for emergency care or RTC including fever >101.87F, chest pain, shortness of breath, new/worsening/un-resolving symptoms, reviewed with patient at time of visit. Follow up and care instructions discussed and provided in AVS.  ------------------------------- I have reviewed this encounter including the documentation in this note and/or discussed this patient with the provider, Suezanne Cheshire DNP AGNP-C. I am certifying that I agree with the content of this note as supervising physician. Enid Derry, [facility] 01/31/2018, 5:34  PM

## 2018-01-30 NOTE — Progress Notes (Signed)
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  INTERVAL HISTORY: Patient returns to clinic today for routine 35-monthevaluation.  She continues to have mild tenderness in her chest wall at the site of her mastectomy, but otherwise feels well.  She continues to be anxious. She has no neurologic complaints. She denies any recent fevers or illnesses. She has a good appetite and denies weight loss. She has no chest pain or shortness of breath. She denies any nausea, vomiting, constipation, or diarrhea. She has no urinary complaints.  Patient otherwise feels well and offers no further specific complaints.  REVIEW OF SYSTEMS:   Review of Systems  Constitutional: Negative.  Negative for fever, malaise/fatigue and weight loss.  Respiratory: Negative.  Negative for cough and shortness of breath.   Cardiovascular: Negative.  Negative for chest pain and leg swelling.  Gastrointestinal: Negative.  Negative for abdominal pain and constipation.  Genitourinary: Negative.  Negative for dysuria.  Musculoskeletal: Negative.  Negative for back pain and myalgias.  Skin: Negative.  Negative for rash.  Neurological: Negative.  Negative for sensory change, focal weakness, weakness and headaches.  Psychiatric/Behavioral: Negative for depression. The patient is nervous/anxious.     As per HPI. Otherwise, a complete review of  systems is negative.  PAST MEDICAL HISTORY: Past Medical History:  Diagnosis Date  . Anemia   . Breast cancer ([place]   . Breast mass, left 09/22/2016   RECOMMENDATION: Ultrasound-guided core biopsies of masses at left breast 2 o'clock 2 cm from nipple, left breast 2 o'clock 5 cm from nipple, abnormal left axillary lymph nodes.  .Marland KitchenGERD (gastroesophageal reflux disease)   . Hyperglycemia   . Hyperlipidemia   . Obesity   . Postmenopausal     PAST SURGICAL HISTORY: Past Surgical History:  Procedure Laterality Date  . ABDOMINAL HYSTERECTOMY    . AXILLARY SENTINEL NODE BIOPSY Left 04/14/2017   Procedure: AXILLARY SENTINEL NODE BIOPSY;  Surgeon: POlean Ree MD;  Location: [facility] ORS;  Service: General;  Laterality: Left;  . BREAST BIOPSY Left 09/30/2016   UKoreabiopsy of 3 areas + chemo  . BREAST EXCISIONAL BIOPSY Left 04/14/2017   Left breast invasive cancer left mastectomy  . ESOPHAGOGASTRODUODENOSCOPY (EGD) WITH PROPOFOL N/A 12/24/2014   Procedure: ESOPHAGOGASTRODUODENOSCOPY (EGD) WITH PROPOFOL;  Surgeon: RManya Silvas MD;  [facility];  Service: Endoscopy;  Laterality: N/A;  . MASTECTOMY Left 04/14/2017   Left breast invasive cancer  . PORTACATH PLACEMENT Right 10/27/2016   Procedure: INSERTION PORT-A-CATH;  Surgeon: TNestor Lewandowsky MD;  Location: [facility] ORS;  Service: General;  Laterality: Right;  . TONSILECTOMY, ADENOIDECTOMY, BILATERAL MYRINGOTOMY AND TUBES    . TONSILLECTOMY    . TOTAL MASTECTOMY Left 04/14/2017   Procedure: TOTAL MASTECTOMY;  Surgeon: POlean Ree MD;  Location: [facility] ORS;  Service: General;  Laterality: Left;    FAMILY HISTORY: Family History  Problem Relation Age of Onset  . Stroke Mother   . Stroke Father   . Cancer Neg Hx   .  Depression Neg Hx     ADVANCED DIRECTIVES (Y/N):  N  HEALTH MAINTENANCE: Social History   Tobacco Use  . Smoking status: Never Smoker  . Smokeless tobacco: Never Used  . Tobacco comment: smoking cessation  materials not required  Substance Use Topics  . Alcohol use: No    Alcohol/week: 0.0 oz  . Drug use: No     Colonoscopy:  PAP:  Bone density:  Lipid panel:  Allergies  Allergen Reactions  . Latex Itching  . Penicillins Itching and Rash    Has patient had a PCN reaction causing immediate rash, facial/tongue/throat swelling, SOB or lightheadedness with hypotension: Yes Has patient had a PCN reaction causing severe rash involving mucus membranes or skin necrosis: No Has patient had a PCN reaction that required hospitalization No Has patient had a PCN reaction occurring within the last 10 years: No If all of the above answers are "NO", then may proceed with Cephalosporin use.     Current Outpatient Medications  Medication Sig Dispense Refill  . acetaminophen (TYLENOL) 325 MG tablet Take 2 tablets (650 mg total) by mouth every 6 (six) hours as needed. 60 tablet 0  . aspirin 81 MG tablet Take 81 mg by mouth daily.    . Cholecalciferol (VITAMIN D) 2000 units tablet Take 2,000 Units by mouth 3 (three) times a week. One by mouth twice a week    . ferrous sulfate 325 (65 FE) MG EC tablet Take 325 mg by mouth 3 (three) times a week.     . fluticasone (FLONASE) 50 MCG/ACT nasal spray Place 1 spray into both nostrils daily as needed for allergies or rhinitis. 16 g 11  . levocetirizine (XYZAL) 5 MG tablet Take 0.5 tablets (2.5 mg total) by mouth every evening. 30 tablet 0  . Multiple Vitamin (MULTIVITAMIN WITH MINERALS) TABS tablet Take 1 tablet by mouth 3 (three) times a week.     . naproxen (NAPROSYN) 250 MG tablet Take 1 tablet (250 mg total) by mouth 2 (two) times daily with a meal. 40 tablet 0  . ondansetron (ZOFRAN ODT) 4 MG disintegrating tablet Take 1 tablet (4 mg total) by mouth every 8 (eight) hours as needed for nausea or vomiting. 20 tablet 0  . Polyethyl Glycol-Propyl Glycol (SYSTANE OP) Place 1 drop into both eyes daily as needed (dry eyes).     . silver sulfADIAZINE (SILVADENE) 1  % cream Apply 1 application topically 2 (two) times daily. 50 g 3  . sodium chloride (OCEAN) 0.65 % SOLN nasal spray Place 1 spray into both nostrils as needed for congestion. 60 mL 0  . magic mouthwash w/lidocaine SOLN Take 5 mLs by mouth 2 (two) times daily. 30 mL 0   No current facility-administered medications for this visit.    Facility-Administered Medications Ordered in Other Visits  Medication Dose Route Frequency Provider Last Rate Last Dose  . heparin lock flush 100 unit/mL  500 Units Intravenous Once Lloyd Huger, MD      . sodium chloride flush (NS) 0.9 % injection 10 mL  10 mL Intravenous Once Lloyd Huger, MD        OBJECTIVE: Vitals:   02/01/18 1144  BP: 122/73  Pulse: 84  Resp: 18  Temp: (!) 96.8 F (36 C)     Body mass index is 31.97 kg/m.    ECOG FS:0 - Asymptomatic  General: Well-developed, well-nourished, no acute distress. Eyes: Pink conjunctiva, anicteric sclera. HEENT: Normocephalic, moist mucous membranes, clear oropharnyx. Breast:  Left mastectomy without evidence of recurrence.  Right breast and axilla without lumps or masses. Lungs: Clear to auscultation bilaterally. Heart: Regular rate and rhythm. No rubs, murmurs, or gallops. Abdomen: Soft, nontender, nondistended. No organomegaly noted, normoactive bowel sounds. Musculoskeletal: No edema, cyanosis, or clubbing. Neuro: Alert, answering all questions appropriately. Cranial nerves grossly intact. Skin: No rashes or petechiae noted. Psych: Normal affect.  LAB RESULTS:  Lab Results  Component Value Date   NA 141 01/15/2018   K 4.2 01/15/2018   CL 113 (H) 01/15/2018   CO2 21 (L) 01/15/2018   GLUCOSE 94 01/15/2018   BUN 25 (H) 01/15/2018   CREATININE 1.16 (H) 01/15/2018   CALCIUM 8.9 01/15/2018   PROT 6.9 01/15/2018   ALBUMIN 3.7 01/15/2018   AST 25 01/15/2018   ALT 18 01/15/2018   ALKPHOS 108 01/15/2018   BILITOT 0.3 01/15/2018   GFRNONAA 44 (L) 01/15/2018   GFRAA 51 (L)  01/15/2018    Lab Results  Component Value Date   WBC 4.2 01/15/2018   NEUTROABS 2.6 11/03/2017   HGB 11.3 (L) 01/15/2018   HCT 32.9 (L) 01/15/2018   MCV 93.2 01/15/2018   PLT 174 01/15/2018     STUDIES: Ct Chest W Contrast  Result Date: 01/15/2018 CLINICAL DATA:  Recurrent pain at the site of the patient's port catheter on the right for the past 2 days. Left mastectomy for breast cancer in 2018. EXAM: CT CHEST WITH CONTRAST TECHNIQUE: Multidetector CT imaging of the chest was performed during intravenous contrast administration. CONTRAST:  60 cc Omnipaque 350 COMPARISON:  PET-CT dated 03/17/2017. FINDINGS: Cardiovascular: The right jugular porta catheter has a normal appearance except for the fact that distally the catheter extends into the left innominate vein with its tip in the left innominate vein. No chest wall abnormality is demonstrated slow seed with the port catheter. Specifically, no fluid or gas collections are seen. Mild atheromatous arterial calcifications, including the thoracic aorta and coronary arteries. Mediastinum/Nodes: No enlarged lymph nodes. 5 mm right lobe thyroid nodule. Lungs/Pleura: Minimal bilateral dependent atelectasis. No lung nodules or pleural fluid. Upper Abdomen: Upper pole right renal cyst. Musculoskeletal: Postmastectomy changes on the left. Minimal scoliosis. No evidence of bony metastatic disease. IMPRESSION: 1. The right jugular porta catheter tip is in the left innominate vein. 2.  Calcific coronary artery and aortic atherosclerosis. 3. 5 mm right lobe thyroid nodule, too small to characterize, but most likely benign in the absence of known clinical risk factors for thyroid carcinoma. 4. No acute abnormality. Aortic Atherosclerosis (ICD10-I70.0). Electronically Signed   By: Claudie Revering M.D.   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  PLAN:      1. 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 She completed neoadjuvant Taxotere, carboplatinum, Herceptin, and Perjeta on February 17, 2017. She had a total mastectomy on April 14, 2017, therefore did not require adjuvant XRT.  She completed her year long maintenance Herceptin on November 03, 2017.  An aromatase inhibitor would not offer benefit given the ER/PR negativity of her disease.  No further intervention is needed.  Patient's most recent mammogram on September 24, 2017 was reported as BI-RADS 1.  Return to clinic in 6 months for routine evaluation.   2. Anemia: Patient's  hemoglobin has trended up slightly and is now 11.3.  Monitor. 3. Renal insufficiency: Creatinine is elevated, but appears approximately her baseline. 4. Hypertension: Blood pressure is within normal limits today.  Patient expressed understanding and was in agreement with this plan. She also understands that She can call clinic at any time with any questions, concerns, or complaints.   Cancer Staging Malignant neoplasm of upper-outer quadrant of left female breast [facility]) Staging form: Breast, AJCC 8th Edition - Clinical stage from 10/11/2016: Stage IIB (cT2, cN1, cM0, G3, ER: Negative, PR: Negative, HER2: Positive) - Signed by Lloyd Huger, MD on 10/11/2016 - Pathologic stage from 04/23/2017: No Stage Recommended (ypT0, pN0, cM0, G3, ER: Negative, PR: Negative, HER2: Positive) - Signed by Lloyd Huger, MD on 04/23/2017   Lloyd Huger, MD   02/04/2018 4:37 PM

## 2018-02-01 ENCOUNTER — Inpatient Hospital Stay (HOSPITAL_BASED_OUTPATIENT_CLINIC_OR_DEPARTMENT_OTHER): Payer: Medicare Other | Admitting: Oncology

## 2018-02-01 ENCOUNTER — Encounter: Payer: Self-pay | Admitting: Oncology

## 2018-02-01 ENCOUNTER — Inpatient Hospital Stay: Payer: Medicare Other | Attending: Oncology

## 2018-02-01 VITALS — BP 122/73 | HR 84 | Temp 96.8°F | Resp 18 | Wt 169.2 lb

## 2018-02-01 DIAGNOSIS — C50412 Malignant neoplasm of upper-outer quadrant of left female breast: Secondary | ICD-10-CM | POA: Diagnosis not present

## 2018-02-01 DIAGNOSIS — Z171 Estrogen receptor negative status [ER-]: Secondary | ICD-10-CM | POA: Diagnosis not present

## 2018-02-01 DIAGNOSIS — Z7982 Long term (current) use of aspirin: Secondary | ICD-10-CM | POA: Insufficient documentation

## 2018-02-01 DIAGNOSIS — D649 Anemia, unspecified: Secondary | ICD-10-CM | POA: Insufficient documentation

## 2018-02-01 DIAGNOSIS — Z9221 Personal history of antineoplastic chemotherapy: Secondary | ICD-10-CM | POA: Diagnosis not present

## 2018-02-01 DIAGNOSIS — Z95828 Presence of other vascular implants and grafts: Secondary | ICD-10-CM

## 2018-02-01 DIAGNOSIS — Z79899 Other long term (current) drug therapy: Secondary | ICD-10-CM | POA: Diagnosis not present

## 2018-02-01 DIAGNOSIS — N289 Disorder of kidney and ureter, unspecified: Secondary | ICD-10-CM | POA: Diagnosis not present

## 2018-02-01 MED ORDER — HEPARIN SOD (PORK) LOCK FLUSH 100 UNIT/ML IV SOLN: 500.0000 [IU] | Freq: Once | INTRAVENOUS | Status: DC

## 2018-02-01 MED ORDER — SODIUM CHLORIDE 0.9% FLUSH
10.0000 mL | Freq: Once | INTRAVENOUS | Status: DC
  Filled 2018-02-01: qty 10

## 2018-02-01 MED ORDER — HEPARIN SOD (PORK) LOCK FLUSH 100 UNIT/ML IV SOLN
INTRAVENOUS | Status: AC
  Filled 2018-02-01: qty 5

## 2018-02-01 NOTE — Progress Notes (Signed)
Pt in for follow up, reports having some "tenderness in left breast where surgery was".  Pt also states that she had been told in the ED that her "port had slipped", pt was suppose to have it evaluated but missed appt.

## 2018-02-04 ENCOUNTER — Ambulatory Visit (INDEPENDENT_AMBULATORY_CARE_PROVIDER_SITE_OTHER): Payer: Medicare Other | Admitting: Surgery

## 2018-02-04 ENCOUNTER — Ambulatory Visit: Payer: Medicare Other | Admitting: Cardiothoracic Surgery

## 2018-02-04 ENCOUNTER — Encounter: Payer: Self-pay | Admitting: Surgery

## 2018-02-04 ENCOUNTER — Encounter: Payer: Self-pay | Admitting: Cardiothoracic Surgery

## 2018-02-04 VITALS — BP 160/71 | HR 78 | Temp 98.0°F | Resp 14 | Ht 61.0 in | Wt 167.0 lb

## 2018-02-04 VITALS — BP 168/71 | HR 78 | Temp 98.0°F | Resp 14 | Ht 61.0 in | Wt 167.0 lb

## 2018-02-04 DIAGNOSIS — Z171 Estrogen receptor negative status [ER-]: Secondary | ICD-10-CM | POA: Diagnosis not present

## 2018-02-04 DIAGNOSIS — C50412 Malignant neoplasm of upper-outer quadrant of left female breast: Secondary | ICD-10-CM

## 2018-02-04 NOTE — Progress Notes (Signed)
  Patient ID: Maria Patterson, female   DOB: 09/28/1940, 77 y.o.   MRN: [id number]  HISTORY: This patient returns today in follow-up.  She states that she was having some tenderness along her right sided Port-A-Cath few weeks ago and she presented to the emergency department where a CT scan was performed.  This revealed that the catheter which have been in the superior vena cava has now migrated to the innominate vein.  There were no other complicating features seen.  She also complains of pain in her left mastectomy site.  She has had no fevers or chills.  She did see Dr. Grayland Ormond and her chemotherapy is now complete without any plans for any additional chemotherapy.  Dr. Grayland Ormond was fine with having the Port-A-Cath removed if needed.   Vitals:   02/04/18 0842  BP: (!) 168/71  Pulse: 78  Resp: 14  Temp: 98 F (36.7 C)     EXAM:    Resp: Lungs are clear bilaterally.  No respiratory distress, normal effort. Heart:  Regular without murmurs Abd:  Abdomen is soft, non distended and non tender. No masses are palpable.  There is no rebound and no guarding.  Neurological: Alert and oriented to person, place, and time. Coordination normal.  Skin: Skin is warm and dry. No rash noted. No diaphoretic. No erythema. No pallor.  Psychiatric: Normal mood and affect. Normal behavior. Judgment and thought content normal.  The Port-A-Cath site itself is clean dry and intact.  There is no erythema or nodules.  Under the left mastectomy incision there is some nodularity that is palpable.  This appears to be firm.  There is no erythema or drainage.   ASSESSMEnt  After discussion with Dr. Grayland Ormond his nurse we have elected to remove her Port-A-Cath.  She will come back next week and will do that in the office.  We will stop her aspirin today.   PLAN:   We will ask Dr. Hampton Abbot to see the patient today for her left mastectomy incision.  We will bring her back next Wednesday to have her Port-A-Cath removed  here in our office.    Nestor Lewandowsky, MD

## 2018-02-04 NOTE — Progress Notes (Signed)
02/04/2018  History of Present Illness: Maria Patterson is a 77 y.o. female with a history of left breast cancer, s/p neoadjuvant chemotherapy and left mastectomy with sentinel node biopsy on 03/2017.  She was seeing Dr. Genevive Bi today as she was having some discomfort at her port site.  She had a CT scan about 3 weeks ago which did not show any complication at the port itself, but as she's done with her chemotherapy, it was agreed to be removed.  During her visit with Dr. Genevive Bi, she mentioned that she's having occasional pain at the mastectomy site.  Overall, the patient reports her pain is only occasional and not constant.  It is not a significant amount of pain either.  It does not impede her daily activities.  Her CT scan did not show any new masses on the left and only showed post-operative changes and scarring.  Past Medical History: Past Medical History:  Diagnosis Date  . Anemia   . Breast cancer ([place])   . Breast mass, left 09/22/2016   RECOMMENDATION: Ultrasound-guided core biopsies of masses at left breast 2 o'clock 2 cm from nipple, left breast 2 o'clock 5 cm from nipple, abnormal left axillary lymph nodes.  Marland Kitchen GERD (gastroesophageal reflux disease)   . Hyperglycemia   . Hyperlipidemia   . Obesity   . Postmenopausal      Past Surgical History: Past Surgical History:  Procedure Laterality Date  . ABDOMINAL HYSTERECTOMY    . AXILLARY SENTINEL NODE BIOPSY Left 04/14/2017   Procedure: AXILLARY SENTINEL NODE BIOPSY;  Surgeon: Olean Ree, MD;  Location: [facility] ORS;  Service: General;  Laterality: Left;  . BREAST BIOPSY Left 09/30/2016   US biopsy of 3 areas + chemo  . BREAST EXCISIONAL BIOPSY Left 04/14/2017   Left breast invasive cancer left mastectomy  . ESOPHAGOGASTRODUODENOSCOPY (EGD) WITH PROPOFOL N/A 12/24/2014   Procedure: ESOPHAGOGASTRODUODENOSCOPY (EGD) WITH PROPOFOL;  Surgeon: Manya Silvas, MD;  [facility];  Service: Endoscopy;  Laterality: N/A;  . MASTECTOMY  Left 04/14/2017   Left breast invasive cancer  . PORTACATH PLACEMENT Right 10/27/2016   Procedure: INSERTION PORT-A-CATH;  Surgeon: Nestor Lewandowsky, MD;  Location: [facility] ORS;  Service: General;  Laterality: Right;  . TONSILECTOMY, ADENOIDECTOMY, BILATERAL MYRINGOTOMY AND TUBES    . TONSILLECTOMY    . TOTAL MASTECTOMY Left 04/14/2017   Procedure: TOTAL MASTECTOMY;  Surgeon: Olean Ree, MD;  Location: [facility] ORS;  Service: General;  Laterality: Left;    Home Medications: Prior to Admission medications   Medication Sig Start Date End Date Taking? Authorizing Provider  acetaminophen (TYLENOL) 325 MG tablet Take 2 tablets (650 mg total) by mouth every 6 (six) hours as needed. 01/15/18  Yes Carrie Mew, MD  aspirin 81 MG tablet Take 81 mg by mouth daily.   Yes [provider]  Cholecalciferol (VITAMIN D) 2000 units tablet Take 2,000 Units by mouth 3 (three) times a week. One by mouth twice a week 09/01/16  Yes Lada, Satira Anis, MD  ferrous sulfate 325 (65 FE) MG EC tablet Take 325 mg by mouth 3 (three) times a week.  09/01/16  Yes Lada, Satira Anis, MD  fluticasone (FLONASE) 50 MCG/ACT nasal spray Place 1 spray into both nostrils daily as needed for allergies or rhinitis. 09/20/17  Yes Lada, Satira Anis, MD  levocetirizine (XYZAL) 5 MG tablet Take 0.5 tablets (2.5 mg total) by mouth every evening. 01/28/18  Yes Poulose, Bethel Born, NP  magic mouthwash w/lidocaine SOLN Take 5 mLs by mouth  2 (two) times daily. 01/28/18  Yes Poulose, Bethel Born, NP  Multiple Vitamin (MULTIVITAMIN WITH MINERALS) TABS tablet Take 1 tablet by mouth 3 (three) times a week.  09/01/16  Yes Lada, Satira Anis, MD  naproxen (NAPROSYN) 250 MG tablet Take 1 tablet (250 mg total) by mouth 2 (two) times daily with a meal. 01/15/18  Yes Carrie Mew, MD  ondansetron (ZOFRAN ODT) 4 MG disintegrating tablet Take 1 tablet (4 mg total) by mouth every 8 (eight) hours as needed for nausea or vomiting. 01/15/18  Yes Carrie Mew, MD   Polyethyl Glycol-Propyl Glycol (SYSTANE OP) Place 1 drop into both eyes daily as needed (dry eyes).    Yes [provider]  silver sulfADIAZINE (SILVADENE) 1 % cream Apply 1 application topically 2 (two) times daily. 08/03/17  Yes Chrystal, Eulas Post, MD  sodium chloride (OCEAN) 0.65 % SOLN nasal spray Place 1 spray into both nostrils as needed for congestion. 01/28/18  Yes Poulose, Bethel Born, NP    Allergies: Allergies  Allergen Reactions  . Latex Itching  . Penicillins Itching and Rash    Has patient had a PCN reaction causing immediate rash, facial/tongue/throat swelling, SOB or lightheadedness with hypotension: Yes Has patient had a PCN reaction causing severe rash involving mucus membranes or skin necrosis: No Has patient had a PCN reaction that required hospitalization No Has patient had a PCN reaction occurring within the last 10 years: No If all of the above answers are "NO", then may proceed with Cephalosporin use.     Review of Systems: Review of Systems  Constitutional: Negative for chills and fever.  Respiratory: Negative for shortness of breath.   Cardiovascular: Negative for chest pain.  Gastrointestinal: Negative for abdominal pain, nausea and vomiting.  Skin:       Pain at left mastectomy site    Physical Exam BP (!) 160/71   Pulse 78   Temp 98 F (36.7 C)   Resp 14   Ht 5\' 1"  (1.549 m)   Wt 167 lb (75.8 kg)   BMI 31.55 kg/m  CONSTITUTIONAL: No acute distress HEENT:  Normocephalic, atraumatic, extraocular motion intact. BREAST:  Left mastectomy site has healed well.  There is one area over the medial portion of the scar that has some tenderness, and one at the mid portion of the scar where a small nodule is palpable.  This corresponds with the scar tissue noted on the CT scan.  Otherwise no other palpable masses and no palpable lymph nodes on the left axilla.  On the right, there are no palpable masses or lymph nodes. MUSCULOSKELETAL:  Normal muscle  strength and tone in all four extremities.  No peripheral edema or cyanosis. SKIN:  Right chest port-a-cath in place without any evidence of infection. NEUROLOGIC:  Motor and sensation is grossly normal.  Cranial nerves are grossly intact. PSYCH:  Alert and oriented to person, place and time. Affect is normal.  Labs/Imaging: CT 01/15/18: IMPRESSION: 1. The right jugular porta catheter tip is in the left innominate vein. 2.  Calcific coronary artery and aortic atherosclerosis. 3. 5 mm right lobe thyroid nodule, too small to characterize, but most likely benign in the absence of known clinical risk factors for thyroid carcinoma. 4. No acute abnormality.  Assessment and Plan: This is a 77 y.o. female who presents with occasional pain at the left mastectomy site.  I have independently viewed the patient's imaging study and discussed it with her.  Overall, I believe what she has is scar  tissue at the incision site, which corresponds to the CT scan findings.  She is due for her one year follow up in September.  She had a mammogram of the right breast in March.  Discussed with the patient that we will see her in September for her follow up and examine her again and if there is any change in the mastectomy scar area, then we would order further imaging.  She does not need a mammogram in September.  Face-to-face time spent with the patient and care providers was 15 minutes, with more than 50% of the time spent counseling, educating, and coordinating care of the patient.     Melvyn Neth, [place]

## 2018-02-04 NOTE — Patient Instructions (Addendum)
Port removal scheduled on 02/09/2018 @ 9:45am   We have spoken today about removing your Maria Patterson. This has been scheduled for 02/09/2018 @ 9:45 am  Please Refer to the [facility]) Pre-Care Sheet for further information.  Call our office with any questions or concerns that you may have.     Implanted Port Removal Implanted port removal is a procedure to remove the port and catheter (port-a-cath) that is implanted under your skin. The port is a small disc under your skin that can be punctured with a needle. It is connected to a vein in your chest or neck by a small flexible tube (catheter). The port-a-cath is used for treatment through an IV tube and for taking blood samples. Your health care provider will remove the port-a-cath if:  You no longer need it for treatment.  It is not working properly.  The area around it gets infected.  Tell a health care provider about:  Any allergies you have.  All medicines you are taking, including vitamins, herbs, eye drops, creams, and over-the-counter medicines.  Any problems you or family members have had with anesthetic medicines.  Any blood disorders you have.  Any surgeries you have had.  Any medical conditions you have.  Whether you are pregnant or may be pregnant. What are the risks? Generally, this is a safe procedure. However, problems may occur, including:  Infection.  Bleeding.  Allergic reactions to anesthetic medicines.  Damage to nerves or blood vessels.  What happens before the procedure?  You will have: ? A physical exam. ? Blood tests. ? Imaging tests, including a chest X-ray.  Follow instructions from your health care provider about eating or drinking restrictions.  Ask your health care provider about: ? Changing or stopping your regular medicines. This is especially important if you are taking diabetes medicines or blood thinners. ? Taking medicines such as aspirin and ibuprofen. These medicines can thin your blood.  Do not take these medicines before your procedure if your surgeon instructs you not to.  Ask your health care provider how your surgical site will be marked or identified.  You may be given antibiotic medicine to help prevent infection.  Plan to have someone take you home after the procedure.  If you will be going home right after the procedure, plan to have someone stay with you for 24 hours. What happens during the procedure?  To reduce your risk of infection: ? Your health care team will wash or sanitize their hands. ? Your skin will be washed with soap.  You may be given one or more of the following: ? A medicine to help you relax (sedative). ? A medicine to numb the area (local anesthetic).  A small cut (incision) will be made at the site of your port-a-cath.  The port-a-cath and the catheter that has been inside your vein will gently be removed.  The incision will be closed with stitches (sutures), adhesive strips, or skin glue.  A bandage (dressing) will be placed over the incision. The procedure may vary among health care providers and hospitals. What happens after the procedure?  Your blood pressure, heart rate, breathing rate, and blood oxygen level will be monitored often until the medicines you were given have worn off.  Do not drive for 24 hours if you received a sedative. This information is not intended to replace advice given to you by your health care provider. Make sure you discuss any questions you have with your health care provider. Document Released:  06/17/2015 Document Revised: 12/12/2015 Document Reviewed: 04/10/2015 Elsevier Interactive Patient Education  [employer].  The patient will stop her aspirin for now until her port removal is complete.

## 2018-02-09 ENCOUNTER — Ambulatory Visit: Payer: Medicare Other | Admitting: Cardiothoracic Surgery

## 2018-02-09 ENCOUNTER — Encounter: Payer: Self-pay | Admitting: Cardiothoracic Surgery

## 2018-02-09 VITALS — BP 132/74 | HR 80 | Resp 16 | Ht 61.0 in | Wt 170.0 lb

## 2018-02-09 DIAGNOSIS — Z171 Estrogen receptor negative status [ER-]: Principal | ICD-10-CM

## 2018-02-09 DIAGNOSIS — C50412 Malignant neoplasm of upper-outer quadrant of left female breast: Secondary | ICD-10-CM

## 2018-02-09 NOTE — Progress Notes (Signed)
Ms. Santee is a 77 year old woman who has a Port-A-Cath in place on the right side.  She had been extensively evaluated by Dr. Grayland Ormond and had completed her chemotherapy.  She now desired to have her port removed.  I elected to do this in the office today.  The following is the description of the procedure.  Patient had no specific complaints today.  She did understand the rationale for removal of the catheter.  The risks were reviewed as well.  A timeout was called and the appropriate patient was identified.  Using 10 cc of 1% lidocaine with epinephrine a skin wheal was raised over the prior incision on the anterior chest wall using sterile technique.  After approximately 10 minutes the patient was then prepped and draped in usual sterile fashion.  Ms. Veva Holes PA-S assisted me during the removal.  Once a skin incision was made the electrocautery was used to deepen the incision until the catheter was identified.  The catheter was then withdrawn from the internal jugular vein.  The catheter was then dissected from the tissues and the 3 Prolene sutures securing the catheter were removed.  Additional lidocaine was used along the pseudocapsule and the pseudocapsule was also removed using electrocautery.  The tissues were hemostatic and they were then closed in layers using 3-0 Vicryl on the deeper layers and 3-0 nylon on the skin.  Sterile dressings were applied.  Patient tolerated procedure well.  She was given a prescription for Percocet 11/20/2023 dispense number 10 tablets.  She will come back and see [place] in 10 days for suture removal.

## 2018-02-09 NOTE — Patient Instructions (Signed)
Please see your follow up appointment listed below.     Implanted Port Removal Implanted port removal is a procedure to remove the port and catheter (port-a-cath) that is implanted under your skin. The port is a small disc under your skin that can be punctured with a needle. It is connected to a vein in your chest or neck by a small flexible tube (catheter). The port-a-cath is used for treatment through an IV tube and for taking blood samples. Your health care provider will remove the port-a-cath if:  You no longer need it for treatment.  It is not working properly.  The area around it gets infected.  Tell a health care provider about:  Any allergies you have.  All medicines you are taking, including vitamins, herbs, eye drops, creams, and over-the-counter medicines.  Any problems you or family members have had with anesthetic medicines.  Any blood disorders you have.  Any surgeries you have had.  Any medical conditions you have.  Whether you are pregnant or may be pregnant. What are the risks? Generally, this is a safe procedure. However, problems may occur, including:  Infection.  Bleeding.  Allergic reactions to anesthetic medicines.  Damage to nerves or blood vessels.  What happens before the procedure?  You will have: ? A physical exam. ? Blood tests. ? Imaging tests, including a chest X-ray.  Follow instructions from your health care provider about eating or drinking restrictions.  Ask your health care provider about: ? Changing or stopping your regular medicines. This is especially important if you are taking diabetes medicines or blood thinners. ? Taking medicines such as aspirin and ibuprofen. These medicines can thin your blood. Do not take these medicines before your procedure if your surgeon instructs you not to.  Ask your health care provider how your surgical site will be marked or identified.  You may be given antibiotic medicine to help prevent  infection.  Plan to have someone take you home after the procedure.  If you will be going home right after the procedure, plan to have someone stay with you for 24 hours. What happens during the procedure?  To reduce your risk of infection: ? Your health care team will wash or sanitize their hands. ? Your skin will be washed with soap.  You may be given one or more of the following: ? A medicine to help you relax (sedative). ? A medicine to numb the area (local anesthetic).  A small cut (incision) will be made at the site of your port-a-cath.  The port-a-cath and the catheter that has been inside your vein will gently be removed.  The incision will be closed with stitches (sutures), adhesive strips, or skin glue.  A bandage (dressing) will be placed over the incision. The procedure may vary among health care providers and hospitals. What happens after the procedure?  Your blood pressure, heart rate, breathing rate, and blood oxygen level will be monitored often until the medicines you were given have worn off.  Do not drive for 24 hours if you received a sedative. This information is not intended to replace advice given to you by your health care provider. Make sure you discuss any questions you have with your health care provider. Document Released: 06/17/2015 Document Revised: 12/12/2015 Document Reviewed: 04/10/2015 Elsevier Interactive Patient Education  [employer].

## 2018-02-21 ENCOUNTER — Ambulatory Visit (INDEPENDENT_AMBULATORY_CARE_PROVIDER_SITE_OTHER): Payer: Medicare Other | Admitting: Cardiothoracic Surgery

## 2018-02-21 DIAGNOSIS — Z171 Estrogen receptor negative status [ER-]: Secondary | ICD-10-CM

## 2018-02-21 DIAGNOSIS — C50412 Malignant neoplasm of upper-outer quadrant of left female breast: Secondary | ICD-10-CM

## 2018-02-21 NOTE — Patient Instructions (Signed)
As scheduled

## 2018-02-21 NOTE — Progress Notes (Signed)
She returns today in follow-up.  She has had no problems with her wounds.  Her right sided Port-A-Cath incision is well approximated without evidence of erythema or drainage.  The sutures were removed today.  A Steri-Strip was placed.  She will return as needed.

## 2018-02-21 NOTE — Progress Notes (Signed)
Patient came in today for a wound check.  The wound is clean, with no signs of infection noted. The sutures were removed and steri strips applied.  

## 2018-02-25 ENCOUNTER — Encounter: Payer: Self-pay | Admitting: Nurse Practitioner

## 2018-02-25 ENCOUNTER — Ambulatory Visit (INDEPENDENT_AMBULATORY_CARE_PROVIDER_SITE_OTHER): Payer: Medicare Other | Admitting: Nurse Practitioner

## 2018-02-25 VITALS — BP 130/70 | HR 75 | Temp 98.2°F | Resp 16 | Ht 61.0 in | Wt 170.2 lb

## 2018-02-25 DIAGNOSIS — B0239 Other herpes zoster eye disease: Secondary | ICD-10-CM | POA: Diagnosis not present

## 2018-02-25 DIAGNOSIS — R21 Rash and other nonspecific skin eruption: Secondary | ICD-10-CM

## 2018-02-25 MED ORDER — VALACYCLOVIR HCL 1 G PO TABS: 1000.0000 mg | ORAL_TABLET | Freq: Three times a day (TID) | ORAL | 0 refills | Status: DC

## 2018-02-25 NOTE — Patient Instructions (Addendum)
-   Please go to eye doctor today - Please take antiviral medication as prescribed all the way through - We will work on getting you into the walk-in derm clinic - if you are having eye redness, vision changes, fevers or chills please seek immediate medical attention  - Follow up on Monday to ensure improving or if needed change in treatment

## 2018-02-25 NOTE — Progress Notes (Addendum)
Name: Maria Patterson   MRN: [id number]    DOB: 28-Aug-1940   Date:02/25/2018       Progress Note  Subjective  Chief Complaint  Chief Complaint  Patient presents with  . Rash    on her neck x 1 day    HPI  Noted small bump on neck a week ago and then yesterday red rash spread across right side of neck and started down right arm. Endorses burning itchy pain. Bumps noted now on left side, nose.   Dermatome-C2, C3, C4, C6 and T1. Not on chemotherapy currently; in remission.   Patient Active Problem List   Diagnosis Date Noted  . Insomnia 09/26/2017  . Chronic renal disease, stage III ([place]) 08/09/2017  . Hypomagnesemia 05/04/2017  . Aortic atherosclerosis ([place]) 05/04/2017  . Compression fracture of first lumbar vertebra ([place]) 05/04/2017  . Degenerative disc disease, lumbar 05/04/2017  . Breast cancer, left ([place]) 04/14/2017  . Tachycardia 02/01/2017  . Anemia 02/01/2017  . Atherosclerosis of native arteries of extremity with intermittent claudication ([place]) 11/09/2016  . Allergic rhinitis 11/07/2016  . Malignant neoplasm of upper-outer quadrant of left female breast ([place]) 10/08/2016  . Breast mass, left 09/22/2016  . Encounter for screening for HIV 09/18/2016  . Osteoporosis screening 09/18/2016  . Postmenopausal 09/18/2016  . Ache in joint 03/09/2016  . Overweight 03/09/2016  . Medication monitoring encounter 10/30/2015  . GERD without esophagitis 07/01/2012  . Hyperlipidemia LDL goal <100 07/01/2012    Past Medical History:  Diagnosis Date  . Anemia   . Breast cancer ([place])   . Breast mass, left 09/22/2016   RECOMMENDATION: Ultrasound-guided core biopsies of masses at left breast 2 o'clock 2 cm from nipple, left breast 2 o'clock 5 cm from nipple, abnormal left axillary lymph nodes.  Marland Kitchen GERD (gastroesophageal reflux disease)   . Hyperglycemia   . Hyperlipidemia   . Obesity   . Postmenopausal     Past Surgical History:  Procedure Laterality Date  . ABDOMINAL HYSTERECTOMY     . AXILLARY SENTINEL NODE BIOPSY Left 04/14/2017   Procedure: AXILLARY SENTINEL NODE BIOPSY;  Surgeon: Olean Ree, MD;  Location: [facility] ORS;  Service: General;  Laterality: Left;  . BREAST BIOPSY Left 09/30/2016   US biopsy of 3 areas + chemo  . BREAST EXCISIONAL BIOPSY Left 04/14/2017   Left breast invasive cancer left mastectomy  . ESOPHAGOGASTRODUODENOSCOPY (EGD) WITH PROPOFOL N/A 12/24/2014   Procedure: ESOPHAGOGASTRODUODENOSCOPY (EGD) WITH PROPOFOL;  Surgeon: Manya Silvas, MD;  Location: [facility] ENDOSCOPY;  Service: Endoscopy;  Laterality: N/A;  . MASTECTOMY Left 04/14/2017   Left breast invasive cancer  . PORTACATH PLACEMENT Right 10/27/2016   Procedure: INSERTION PORT-A-CATH;  Surgeon: Nestor Lewandowsky, MD;  Location: [facility] ORS;  Service: General;  Laterality: Right;  . TONSILECTOMY, ADENOIDECTOMY, BILATERAL MYRINGOTOMY AND TUBES    . TONSILLECTOMY    . TOTAL MASTECTOMY Left 04/14/2017   Procedure: TOTAL MASTECTOMY;  Surgeon: Olean Ree, MD;  Location: [facility] ORS;  Service: General;  Laterality: Left;    Social History   Tobacco Use  . Smoking status: Never Smoker  . Smokeless tobacco: Never Used  . Tobacco comment: smoking cessation materials not required  Substance Use Topics  . Alcohol use: No    Alcohol/week: 0.0 standard drinks     Current Outpatient Medications:  .  acetaminophen (TYLENOL) 325 MG tablet, Take 2 tablets (650 mg total) by mouth every 6 (six) hours as needed., Disp: 60 tablet, Rfl: 0 .  aspirin 81 MG tablet,  Take 81 mg by mouth daily., Disp: , Rfl:  .  Cholecalciferol (VITAMIN D) 2000 units tablet, Take 2,000 Units by mouth 3 (three) times a week. One by mouth twice a week, Disp: , Rfl:  .  ferrous sulfate 325 (65 FE) MG EC tablet, Take 325 mg by mouth 3 (three) times a week. , Disp: , Rfl:  .  fluticasone (FLONASE) 50 MCG/ACT nasal spray, Place 1 spray into both nostrils daily as needed for allergies or rhinitis., Disp: 16 g, Rfl: 11 .  levocetirizine  (XYZAL) 5 MG tablet, Take 0.5 tablets (2.5 mg total) by mouth every evening., Disp: 30 tablet, Rfl: 0 .  magic mouthwash w/lidocaine SOLN, Take 5 mLs by mouth 2 (two) times daily., Disp: 30 mL, Rfl: 0 .  Multiple Vitamin (MULTIVITAMIN WITH MINERALS) TABS tablet, Take 1 tablet by mouth 3 (three) times a week. , Disp: , Rfl:  .  naproxen (NAPROSYN) 250 MG tablet, Take 1 tablet (250 mg total) by mouth 2 (two) times daily with a meal., Disp: 40 tablet, Rfl: 0 .  ondansetron (ZOFRAN ODT) 4 MG disintegrating tablet, Take 1 tablet (4 mg total) by mouth every 8 (eight) hours as needed for nausea or vomiting., Disp: 20 tablet, Rfl: 0 .  oxyCODONE-acetaminophen (PERCOCET/ROXICET) 5-325 MG tablet, Take 1 tablet by mouth as needed for severe pain., Disp: , Rfl:  .  Polyethyl Glycol-Propyl Glycol (SYSTANE OP), Place 1 drop into both eyes daily as needed (dry eyes). , Disp: , Rfl:  .  silver sulfADIAZINE (SILVADENE) 1 % cream, Apply 1 application topically 2 (two) times daily., Disp: 50 g, Rfl: 3 .  sodium chloride (OCEAN) 0.65 % SOLN nasal spray, Place 1 spray into both nostrils as needed for congestion., Disp: 60 mL, Rfl: 0 No current facility-administered medications for this visit.   Facility-Administered Medications Ordered in Other Visits:  .  heparin lock flush 100 unit/mL, 500 Units, Intravenous, Once, Finnegan, Kathlene November, MD .  sodium chloride flush (NS) 0.9 % injection 10 mL, 10 mL, Intravenous, Once, Finnegan, Kathlene November, MD  Allergies  Allergen Reactions  . Latex Itching  . Penicillins Itching and Rash    Has patient had a PCN reaction causing immediate rash, facial/tongue/throat swelling, SOB or lightheadedness with hypotension: Yes Has patient had a PCN reaction causing severe rash involving mucus membranes or skin necrosis: No Has patient had a PCN reaction that required hospitalization No Has patient had a PCN reaction occurring within the last 10 years: No If all of the above answers are  "NO", then may proceed with Cephalosporin use.     Review of Systems  Constitutional: Negative for chills and fever.  Eyes: Positive for discharge (clear right eye watering). Negative for photophobia, pain and redness.  Respiratory: Negative for shortness of breath.   Cardiovascular: Negative for chest pain.  Musculoskeletal: Negative for myalgias.  Skin: Positive for itching and rash.  Neurological: Negative for headaches.    No other specific complaints in a complete review of systems (except as listed in HPI above).  Objective  Vitals:   02/25/18 1042  BP: 130/70  Pulse: 75  Resp: 16  Temp: 98.2 F (36.8 C)  TempSrc: Oral  SpO2: 99%  Weight: 170 lb 3.2 oz (77.2 kg)  Height: 5\' 1"  (1.549 m)    Body mass index is 32.16 kg/m.  Nursing Note and Vital Signs reviewed.  Physical Exam  Constitutional: She is oriented to person, place, and time. She appears well-developed and well-nourished.  HENT:  Head:    Eyes: Pupils are equal, round, and reactive to light. Conjunctivae and EOM are normal.  Cardiovascular: Normal rate.  Pulmonary/Chest: Effort normal.  Neurological: She is alert and oriented to person, place, and time.  Skin: Skin is warm and dry.     Psychiatric: She has a normal mood and affect. Her behavior is normal. Judgment and thought content normal.    No results found for this or any previous visit (from the past 48 hour(s)).  Assessment & Plan  1. Blistering rash Concern for disseminated shingles cs contact dermatitis states has not been on chemo for years; due to lesions near eye send to ophthalmology today; start valcyclovir- follow up on Monday; discussed ER precautions  - Ambulatory referral to Ophthalmology - valACYclovir (VALTREX) 1000 MG tablet; Take 1 tablet (1,000 mg total) by mouth 3 (three) times daily.  Dispense: 21 tablet; Refill: 0 - Ambulatory referral to Dermatology    -Red flags and when to present for emergency care or RTC - if  you are having eye redness, vision changes, fevers or chills, new/worsening/un-resolving symptoms,  reviewed with patient at time of visit. Follow up and care instructions discussed and provided in AVS.  ----------------------------------------------- I have reviewed this encounter including the documentation in this note and/or discussed this patient with the provider, Suezanne Cheshire DNP AGNP-C. I am certifying that I agree with the content of this note as supervising physician. Enid Derry, [facility] 03/06/2018, 6:02 PM

## 2018-02-27 ENCOUNTER — Other Ambulatory Visit: Payer: Self-pay

## 2018-02-27 ENCOUNTER — Emergency Department
Admission: EM | Admit: 2018-02-27 | Discharge: 2018-02-27 | Disposition: A | Discharge status: Home / Self Care | Payer: Medicare Other | Attending: Emergency Medicine | Admitting: Emergency Medicine

## 2018-02-27 ENCOUNTER — Encounter: Payer: Self-pay | Admitting: Emergency Medicine

## 2018-02-27 DIAGNOSIS — Z79899 Other long term (current) drug therapy: Secondary | ICD-10-CM | POA: Insufficient documentation

## 2018-02-27 DIAGNOSIS — N183 Chronic kidney disease, stage 3 (moderate): Secondary | ICD-10-CM | POA: Insufficient documentation

## 2018-02-27 DIAGNOSIS — Z91040 Latex allergy status: Secondary | ICD-10-CM | POA: Diagnosis not present

## 2018-02-27 DIAGNOSIS — E785 Hyperlipidemia, unspecified: Secondary | ICD-10-CM | POA: Diagnosis not present

## 2018-02-27 DIAGNOSIS — Z7982 Long term (current) use of aspirin: Secondary | ICD-10-CM | POA: Insufficient documentation

## 2018-02-27 DIAGNOSIS — R21 Rash and other nonspecific skin eruption: Secondary | ICD-10-CM | POA: Diagnosis not present

## 2018-02-27 DIAGNOSIS — L259 Unspecified contact dermatitis, unspecified cause: Secondary | ICD-10-CM | POA: Insufficient documentation

## 2018-02-27 DIAGNOSIS — Z853 Personal history of malignant neoplasm of breast: Secondary | ICD-10-CM | POA: Diagnosis not present

## 2018-02-27 MED ORDER — PREDNISONE 10 MG PO TABS: 10.0000 mg | ORAL_TABLET | Freq: Every day | ORAL | 0 refills | Status: DC

## 2018-02-27 NOTE — Discharge Instructions (Signed)
As we discussed please get over-the-counter "technu" soap from a pharmacy to help remove the oil from poison ivy/poison oak.  Please begin taking her steroids today as prescribed.  Return to the emergency department for any fever, if you think the rash is worsening, or any other symptom personally concerning to yourself.  Otherwise please follow-up with your doctor on Monday.

## 2018-02-27 NOTE — ED Provider Notes (Signed)
[facility] Emergency Department Provider Note  Time seen: 4:12 PM  I have reviewed the triage vital signs and the nursing notes.   HISTORY  Chief Complaint Rash    HPI Maria Patterson is a 77 y.o. female with a past medical history of anemia, gastric reflux, hyperlipidemia, presents to the emergency department for a rash.  According to the patient she has had a rash for the past 5 days to her right face and chest which is now spread to her left face and both of her arms.  States it is very itchy and now it is weeping fluid.  Saw her doctor 2 days ago on Friday states her doctor is not sure what the rash was prescribed hydrocortisone as well as valacyclovir in case it was shingles.  Has a rash to involve the area around her right eye she was seen by her eye doctor on Friday as well who states there is no involvement of the eye.  Patient states despite taking the medication she feels like the rash is not improving and it is now on the left side of her face as well as somewhat on her upper extremities as well.  States is very itchy but as long as she does not scratch it does not bother her too much.  Denies any fever.  Largely negative review of systems otherwise.   Past Medical History:  Diagnosis Date  . Anemia   . Breast cancer ([place])   . Breast mass, left 09/22/2016   RECOMMENDATION: Ultrasound-guided core biopsies of masses at left breast 2 o'clock 2 cm from nipple, left breast 2 o'clock 5 cm from nipple, abnormal left axillary lymph nodes.  Marland Kitchen GERD (gastroesophageal reflux disease)   . Hyperglycemia   . Hyperlipidemia   . Obesity   . Postmenopausal     Patient Active Problem List   Diagnosis Date Noted  . Insomnia 09/26/2017  . Chronic renal disease, stage III ([place]) 08/09/2017  . Hypomagnesemia 05/04/2017  . Aortic atherosclerosis ([place]) 05/04/2017  . Compression fracture of first lumbar vertebra ([place]) 05/04/2017  . Degenerative disc disease, lumbar 05/04/2017   . Breast cancer, left ([place]) 04/14/2017  . Tachycardia 02/01/2017  . Anemia 02/01/2017  . Atherosclerosis of native arteries of extremity with intermittent claudication ([place]) 11/09/2016  . Allergic rhinitis 11/07/2016  . Malignant neoplasm of upper-outer quadrant of left female breast ([place]) 10/08/2016  . Breast mass, left 09/22/2016  . Encounter for screening for HIV 09/18/2016  . Osteoporosis screening 09/18/2016  . Postmenopausal 09/18/2016  . Ache in joint 03/09/2016  . Overweight 03/09/2016  . Medication monitoring encounter 10/30/2015  . GERD without esophagitis 07/01/2012  . Hyperlipidemia LDL goal <100 07/01/2012    Past Surgical History:  Procedure Laterality Date  . ABDOMINAL HYSTERECTOMY    . AXILLARY SENTINEL NODE BIOPSY Left 04/14/2017   Procedure: AXILLARY SENTINEL NODE BIOPSY;  Surgeon: Olean Ree, MD;  Location: [facility] ORS;  Service: General;  Laterality: Left;  . BREAST BIOPSY Left 09/30/2016   US biopsy of 3 areas + chemo  . BREAST EXCISIONAL BIOPSY Left 04/14/2017   Left breast invasive cancer left mastectomy  . ESOPHAGOGASTRODUODENOSCOPY (EGD) WITH PROPOFOL N/A 12/24/2014   Procedure: ESOPHAGOGASTRODUODENOSCOPY (EGD) WITH PROPOFOL;  Surgeon: Manya Silvas, MD;  Location: [facility] ENDOSCOPY;  Service: Endoscopy;  Laterality: N/A;  . MASTECTOMY Left 04/14/2017   Left breast invasive cancer  . PORTACATH PLACEMENT Right 10/27/2016   Procedure: INSERTION PORT-A-CATH;  Surgeon: Nestor Lewandowsky, MD;  Location:  [facility] ORS;  Service: General;  Laterality: Right;  . TONSILECTOMY, ADENOIDECTOMY, BILATERAL MYRINGOTOMY AND TUBES    . TONSILLECTOMY    . TOTAL MASTECTOMY Left 04/14/2017   Procedure: TOTAL MASTECTOMY;  Surgeon: Olean Ree, MD;  Location: [facility] ORS;  Service: General;  Laterality: Left;    Prior to Admission medications   Medication Sig Start Date End Date Taking? Authorizing Provider  acetaminophen (TYLENOL) 325 MG tablet Take 2 tablets (650 mg total) by mouth  every 6 (six) hours as needed. 01/15/18   Carrie Mew, MD  aspirin 81 MG tablet Take 81 mg by mouth daily.    [provider]  Cholecalciferol (VITAMIN D) 2000 units tablet Take 2,000 Units by mouth 3 (three) times a week. One by mouth twice a week 09/01/16   Arnetha Courser, MD  ferrous sulfate 325 (65 FE) MG EC tablet Take 325 mg by mouth 3 (three) times a week.  09/01/16   Arnetha Courser, MD  fluticasone (FLONASE) 50 MCG/ACT nasal spray Place 1 spray into both nostrils daily as needed for allergies or rhinitis. 09/20/17   Lada, Satira Anis, MD  levocetirizine (XYZAL) 5 MG tablet Take 0.5 tablets (2.5 mg total) by mouth every evening. 01/28/18   Poulose, Bethel Born, NP  magic mouthwash w/lidocaine SOLN Take 5 mLs by mouth 2 (two) times daily. 01/28/18   Poulose, Bethel Born, NP  Multiple Vitamin (MULTIVITAMIN WITH MINERALS) TABS tablet Take 1 tablet by mouth 3 (three) times a week.  09/01/16   Arnetha Courser, MD  naproxen (NAPROSYN) 250 MG tablet Take 1 tablet (250 mg total) by mouth 2 (two) times daily with a meal. 01/15/18   Carrie Mew, MD  ondansetron (ZOFRAN ODT) 4 MG disintegrating tablet Take 1 tablet (4 mg total) by mouth every 8 (eight) hours as needed for nausea or vomiting. 01/15/18   Carrie Mew, MD  oxyCODONE-acetaminophen (PERCOCET/ROXICET) 5-325 MG tablet Take 1 tablet by mouth as needed for severe pain.    [provider]  Polyethyl Glycol-Propyl Glycol (SYSTANE OP) Place 1 drop into both eyes daily as needed (dry eyes).     [provider]  silver sulfADIAZINE (SILVADENE) 1 % cream Apply 1 application topically 2 (two) times daily. 08/03/17   Noreene Filbert, MD  sodium chloride (OCEAN) 0.65 % SOLN nasal spray Place 1 spray into both nostrils as needed for congestion. 01/28/18   Poulose, Bethel Born, NP  valACYclovir (VALTREX) 1000 MG tablet Take 1 tablet (1,000 mg total) by mouth 3 (three) times daily. 02/25/18   Poulose, Bethel Born, NP     Allergies  Allergen Reactions  . Latex Itching  . Penicillins Itching and Rash    Has patient had a PCN reaction causing immediate rash, facial/tongue/throat swelling, SOB or lightheadedness with hypotension: Yes Has patient had a PCN reaction causing severe rash involving mucus membranes or skin necrosis: No Has patient had a PCN reaction that required hospitalization No Has patient had a PCN reaction occurring within the last 10 years: No If all of the above answers are "NO", then may proceed with Cephalosporin use.     Family History  Problem Relation Age of Onset  . Stroke Mother   . Stroke Father   . Cancer Neg Hx   . Depression Neg Hx     Social History Social History   Tobacco Use  . Smoking status: Never Smoker  . Smokeless tobacco: Never Used  . Tobacco comment: smoking cessation materials not required  Substance Use Topics  . Alcohol use: No    Alcohol/week: 0.0 standard drinks  . Drug use: No    Review of Systems Constitutional: Negative for fever. Eyes: Negative for visual complaints ENT: Rash on face Cardiovascular: Negative for chest pain. Respiratory: Negative for shortness of breath. Gastrointestinal: Negative for abdominal pain, vomiting  Genitourinary: Negative for urinary compaints Musculoskeletal: Negative for musculoskeletal complaints Skin: Rash to face, right chest, bilateral upper extremities Neurological: Negative for headache All other ROS negative  ____________________________________________   PHYSICAL EXAM:  VITAL SIGNS: ED Triage Vitals [02/27/18 1528]  Enc Vitals Group     BP 138/69     Pulse Rate 62     Resp 16     Temp 98.7 F (37.1 C)     Temp Source Oral     SpO2 100 %     Weight      Height      Head Circumference      Peak Flow      Pain Score 0     Pain Loc      Pain Edu?      Excl. in [place]?    Constitutional: Alert and oriented. Well appearing and in no distress. Eyes: Normal exam ENT   Head:  Patient has a vesicular rash sleep to her right face but now also involving the left face.  Also involves the neck and right chest   Mouth/Throat: Mucous membranes are moist. Cardiovascular: Normal rate, regular rhythm. Respiratory: Normal respiratory effort without tachypnea nor retractions. Breath sounds are clear Gastrointestinal: Soft and nontender. No distention.   Musculoskeletal: Nontender with normal range of motion in all extremities. Neurologic:  Normal speech and language. No gross focal neurologic deficits  Skin: Patient has a vesicular rash over both sides of her face, right neck right chest both arms most consistent with contact dermatitis.  Appears to be weeping and areas especially across the right chest. Psychiatric: Mood and affect are normal.   ____________________________________________   INITIAL IMPRESSION / ASSESSMENT AND PLAN / ED COURSE  Pertinent labs & imaging results that were available during my care of the patient were reviewed by me and considered in my medical decision making (see chart for details).  Patient presents to the emergency department for a rash to her face chest and arms.  I discussed with the patient in more detail the timing of the rash.  She states a little over 1 week ago she had a tree fall down on her fence.  States she was out there clearing weeds and vines that were growing on the fence.  She states approximately 3 days later she developed a rash on her face and chest.  Patient does not believe that when she pulled off the fence was poison ivy but states it was growing like a Vine across the fence.  On examination the rash is very consistent with poison ivy.  It is a vesicular rash that is now weeping and parts it is not following a dermatomal pattern appears to be bilaterally on upper extremities bilaterally on face more so on the right neck and chest.  I discussed with the patient discontinuing the valacyclovir and instead starting a  prednisone taper continuing to use hydrocortisone as needed for itch and following up with her doctor tomorrow.  Patient agreeable to this plan of care.  Highly suspect contact dermatitis.  ____________________________________________   FINAL CLINICAL IMPRESSION(S) / ED DIAGNOSES  Contact dermatitis    Harvest Dark, MD  02/27/18 1618  

## 2018-02-27 NOTE — ED Triage Notes (Signed)
Pt to ED via POV c/o rash on her chest, face, and arms. Pt states that she saw her PCP last week and was given cream to use but the rash is getting worse. Pt states that the rash is not itching anymore. PT is in NAD at this time.

## 2018-02-28 ENCOUNTER — Encounter: Payer: Self-pay | Admitting: Nurse Practitioner

## 2018-02-28 ENCOUNTER — Ambulatory Visit (INDEPENDENT_AMBULATORY_CARE_PROVIDER_SITE_OTHER): Payer: Medicare Other | Admitting: Nurse Practitioner

## 2018-02-28 VITALS — BP 120/70 | HR 96 | Temp 98.4°F | Resp 16 | Ht 61.0 in | Wt 170.1 lb

## 2018-02-28 DIAGNOSIS — R21 Rash and other nonspecific skin eruption: Secondary | ICD-10-CM

## 2018-02-28 NOTE — Patient Instructions (Addendum)
-   Continue prednisone taper. This can take a few weeks for the rash to resolve even after medication is complete. However if rash is worsening at any point or you have any new or concerning symptoms please come in to be seen.  - Apply cool, wet compresses. Moisten soft washcloths and hold them against the rash to soothe your skin for 15 to 30 minutes. Repeat several times a day. - For itching if bothersome can try OTC Claritin or hydrocortisone cream you have.  - When vesicles disappear can use A moisturizer to help restore the normal texture of the skin.  Such as:  Petroleum jelly  Vanicream  Cetaphil  Eucerin

## 2018-02-28 NOTE — Progress Notes (Addendum)
Name: Maria Patterson   MRN: [id number]    DOB: March 26, 1941   Date:02/28/2018       Progress Note  Subjective  Chief Complaint  Chief Complaint  Patient presents with  . Rash    3 day follow up, seen in ER on yesterday.    HPI  Patient presents for follow-up of rash started 6 day ago, blistering, draining, burning and itchy. Rash has gone from small chains of vesicular areas and redness to increased diffuse redness, but burning and itching has significantly resolved states just had a weird feeling there now. Went to the eye doctor on Friday- states not eye involvement and rx hydrocortisone cream that seemed to aggravate her rash. Went to ER yesterday due to increased spreading- stopped acyclovir and started on prednisone taper. Took first dose today.   Patient Active Problem List   Diagnosis Date Noted  . Insomnia 09/26/2017  . Chronic renal disease, stage III ([place]) 08/09/2017  . Hypomagnesemia 05/04/2017  . Aortic atherosclerosis ([place]) 05/04/2017  . Compression fracture of first lumbar vertebra ([place]) 05/04/2017  . Degenerative disc disease, lumbar 05/04/2017  . Breast cancer, left ([place]) 04/14/2017  . Tachycardia 02/01/2017  . Anemia 02/01/2017  . Atherosclerosis of native arteries of extremity with intermittent claudication ([place]) 11/09/2016  . Allergic rhinitis 11/07/2016  . Malignant neoplasm of upper-outer quadrant of left female breast ([place]) 10/08/2016  . Breast mass, left 09/22/2016  . Encounter for screening for HIV 09/18/2016  . Osteoporosis screening 09/18/2016  . Postmenopausal 09/18/2016  . Ache in joint 03/09/2016  . Overweight 03/09/2016  . Medication monitoring encounter 10/30/2015  . GERD without esophagitis 07/01/2012  . Hyperlipidemia LDL goal <100 07/01/2012    Past Medical History:  Diagnosis Date  . Anemia   . Breast cancer ([place])   . Breast mass, left 09/22/2016   RECOMMENDATION: Ultrasound-guided core biopsies of masses at left breast 2 o'clock 2 cm from  nipple, left breast 2 o'clock 5 cm from nipple, abnormal left axillary lymph nodes.  Marland Kitchen GERD (gastroesophageal reflux disease)   . Hyperglycemia   . Hyperlipidemia   . Obesity   . Postmenopausal     Past Surgical History:  Procedure Laterality Date  . ABDOMINAL HYSTERECTOMY    . AXILLARY SENTINEL NODE BIOPSY Left 04/14/2017   Procedure: AXILLARY SENTINEL NODE BIOPSY;  Surgeon: Olean Ree, MD;  Location: [facility] ORS;  Service: General;  Laterality: Left;  . BREAST BIOPSY Left 09/30/2016   US biopsy of 3 areas + chemo  . BREAST EXCISIONAL BIOPSY Left 04/14/2017   Left breast invasive cancer left mastectomy  . ESOPHAGOGASTRODUODENOSCOPY (EGD) WITH PROPOFOL N/A 12/24/2014   Procedure: ESOPHAGOGASTRODUODENOSCOPY (EGD) WITH PROPOFOL;  Surgeon: Manya Silvas, MD;  Location: [facility] ENDOSCOPY;  Service: Endoscopy;  Laterality: N/A;  . MASTECTOMY Left 04/14/2017   Left breast invasive cancer  . PORTACATH PLACEMENT Right 10/27/2016   Procedure: INSERTION PORT-A-CATH;  Surgeon: Nestor Lewandowsky, MD;  Location: [facility] ORS;  Service: General;  Laterality: Right;  . TONSILECTOMY, ADENOIDECTOMY, BILATERAL MYRINGOTOMY AND TUBES    . TONSILLECTOMY    . TOTAL MASTECTOMY Left 04/14/2017   Procedure: TOTAL MASTECTOMY;  Surgeon: Olean Ree, MD;  Location: [facility] ORS;  Service: General;  Laterality: Left;    Social History   Tobacco Use  . Smoking status: Never Smoker  . Smokeless tobacco: Never Used  . Tobacco comment: smoking cessation materials not required  Substance Use Topics  . Alcohol use: No    Alcohol/week: 0.0 standard drinks  Current Outpatient Medications:  .  acetaminophen (TYLENOL) 325 MG tablet, Take 2 tablets (650 mg total) by mouth every 6 (six) hours as needed., Disp: 60 tablet, Rfl: 0 .  aspirin 81 MG tablet, Take 81 mg by mouth daily., Disp: , Rfl:  .  Cholecalciferol (VITAMIN D) 2000 units tablet, Take 2,000 Units by mouth 3 (three) times a week. One by mouth twice a week, Disp: ,  Rfl:  .  ferrous sulfate 325 (65 FE) MG EC tablet, Take 325 mg by mouth 3 (three) times a week. , Disp: , Rfl:  .  fluticasone (FLONASE) 50 MCG/ACT nasal spray, Place 1 spray into both nostrils daily as needed for allergies or rhinitis., Disp: 16 g, Rfl: 11 .  levocetirizine (XYZAL) 5 MG tablet, Take 0.5 tablets (2.5 mg total) by mouth every evening., Disp: 30 tablet, Rfl: 0 .  magic mouthwash w/lidocaine SOLN, Take 5 mLs by mouth 2 (two) times daily., Disp: 30 mL, Rfl: 0 .  Multiple Vitamin (MULTIVITAMIN WITH MINERALS) TABS tablet, Take 1 tablet by mouth 3 (three) times a week. , Disp: , Rfl:  .  naproxen (NAPROSYN) 250 MG tablet, Take 1 tablet (250 mg total) by mouth 2 (two) times daily with a meal., Disp: 40 tablet, Rfl: 0 .  ondansetron (ZOFRAN ODT) 4 MG disintegrating tablet, Take 1 tablet (4 mg total) by mouth every 8 (eight) hours as needed for nausea or vomiting., Disp: 20 tablet, Rfl: 0 .  Polyethyl Glycol-Propyl Glycol (SYSTANE OP), Place 1 drop into both eyes daily as needed (dry eyes). , Disp: , Rfl:  .  predniSONE (DELTASONE) 10 MG tablet, Take 1 tablet (10 mg total) by mouth daily. Day 1-3: take 4 tablets PO daily Day 4-6: take 3 tablets PO daily Day 7-9: take 2 tablets PO daily Day 10-12: take 1 tablet PO daily, Disp: 30 tablet, Rfl: 0 .  silver sulfADIAZINE (SILVADENE) 1 % cream, Apply 1 application topically 2 (two) times daily., Disp: 50 g, Rfl: 3 .  valACYclovir (VALTREX) 1000 MG tablet, Take 1 tablet (1,000 mg total) by mouth 3 (three) times daily., Disp: 21 tablet, Rfl: 0 .  oxyCODONE-acetaminophen (PERCOCET/ROXICET) 5-325 MG tablet, Take 1 tablet by mouth as needed for severe pain., Disp: , Rfl:  .  sodium chloride (OCEAN) 0.65 % SOLN nasal spray, Place 1 spray into both nostrils as needed for congestion. (Patient not taking: Reported on 02/28/2018), Disp: 60 mL, Rfl: 0 No current facility-administered medications for this visit.   Facility-Administered Medications Ordered in  Other Visits:  .  heparin lock flush 100 unit/mL, 500 Units, Intravenous, Once, Finnegan, Kathlene November, MD .  sodium chloride flush (NS) 0.9 % injection 10 mL, 10 mL, Intravenous, Once, Finnegan, Kathlene November, MD  Allergies  Allergen Reactions  . Latex Itching  . Penicillins Itching and Rash    Has patient had a PCN reaction causing immediate rash, facial/tongue/throat swelling, SOB or lightheadedness with hypotension: Yes Has patient had a PCN reaction causing severe rash involving mucus membranes or skin necrosis: No Has patient had a PCN reaction that required hospitalization No Has patient had a PCN reaction occurring within the last 10 years: No If all of the above answers are "NO", then may proceed with Cephalosporin use.     Review of Systems  Constitutional: Negative for chills and fever.  HENT: Negative for ear pain.   Eyes: Negative for blurred vision and double vision.  Respiratory: Negative for shortness of breath.   Cardiovascular: Negative  for chest pain.  Gastrointestinal: Negative for abdominal pain.  Musculoskeletal: Positive for joint pain (chronic). Negative for myalgias.  Skin: Positive for rash. Negative for itching.  Neurological: Negative for dizziness and headaches.      No other specific complaints in a complete review of systems (except as listed in HPI above).  Objective  Vitals:   02/28/18 0959  BP: 120/70  Pulse: 96  Resp: 16  Temp: 98.4 F (36.9 C)  TempSrc: Oral  SpO2: 95%  Weight: 170 lb 1.6 oz (77.2 kg)  Height: 5\' 1"  (1.549 m)    Body mass index is 32.14 kg/m.  Nursing Note and Vital Signs reviewed.  Physical Exam  Constitutional: She is oriented to person, place, and time. She appears well-developed and well-nourished.  HENT:  Head:    Eyes: Pupils are equal, round, and reactive to light. Conjunctivae and EOM are normal.  Cardiovascular: Normal rate.  Pulmonary/Chest: Effort normal.  Abdominal: Soft. Bowel sounds are normal.    Neurological: She is alert and oriented to person, place, and time.  Skin: Skin is warm and dry. Rash noted. There is erythema.     Psychiatric: She has a normal mood and affect. Her behavior is normal. Judgment and thought content normal.     No results found for this or any previous visit (from the past 48 hour(s)).  Assessment & Plan  1. Rash and other nonspecific skin eruption  - Continue prednisone taper. This can take a few weeks for the rash to resolve even after medication is complete. However if rash is worsening at any point or you have any new or concerning symptoms please come in to be seen.  - Apply cool, wet compresses. Moisten soft washcloths and hold them against the rash to soothe your skin for 15 to 30 minutes. Repeat several times a day. - For itching if bothersome can try OTC Claritin or hydrocortisone cream you have.  - When vesicles disappear can use A moisturizer to help restore the normal texture of the skin.  Such as:  Petroleum jelly  Vanicream  Cetaphil  Eucerin    -Red flags and when to present for emergency care or RTC including fever >101.6F, chest pain, shortness of breath, new/worsening/un-resolving symptoms, reviewed with patient at time of visit. Follow up and care instructions discussed and provided in AVS.  ----------------------------------- I have reviewed this encounter including the documentation in this note and/or discussed this patient with the provider, Suezanne Cheshire DNP AGNP-C. I am certifying that I agree with the content of this note as supervising physician. Enid Derry, [facility] 03/06/2018, 6:07 PM

## 2018-03-02 ENCOUNTER — Ambulatory Visit
Admission: RE | Admit: 2018-03-02 | Discharge: 2018-03-02 | Disposition: A | Discharge status: Home / Self Care | Payer: Medicare Other | Source: Ambulatory Visit | Attending: Radiation Oncology | Admitting: Radiation Oncology

## 2018-03-02 ENCOUNTER — Other Ambulatory Visit: Payer: Self-pay

## 2018-03-02 ENCOUNTER — Encounter: Payer: Self-pay | Admitting: Radiation Oncology

## 2018-03-02 VITALS — BP 152/71 | HR 73 | Temp 98.0°F | Resp 16 | Wt 171.5 lb

## 2018-03-02 DIAGNOSIS — C50412 Malignant neoplasm of upper-outer quadrant of left female breast: Secondary | ICD-10-CM | POA: Diagnosis not present

## 2018-03-02 DIAGNOSIS — Z923 Personal history of irradiation: Secondary | ICD-10-CM | POA: Insufficient documentation

## 2018-03-02 DIAGNOSIS — E041 Nontoxic single thyroid nodule: Secondary | ICD-10-CM | POA: Diagnosis not present

## 2018-03-02 DIAGNOSIS — Z171 Estrogen receptor negative status [ER-]: Secondary | ICD-10-CM | POA: Diagnosis not present

## 2018-03-02 NOTE — Progress Notes (Signed)
Radiation Oncology Follow up Note  Name: Maria Patterson   Date:   03/02/2018 MRN:  [id number] DOB: 06/30/1941    This 77 y.o. 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 She seen today in routine follow-up and is doing well. She specifically denies any chest wall complaints any new nodularity or masses cough or bone pain..she recently had a CT scan of her chest which I have reviewed showing a 5 mm right thyroid nodule no other active disease. She is not on antiestrogen therapy based on the triple negative nature of her disease.  COMPLICATIONS OF TREATMENT: none  FOLLOW UP COMPLIANCE: keeps appointments   PHYSICAL EXAM:  BP (!) 152/71 (BP Location: Left Arm, Patient Position: Sitting)   Pulse 73   Temp 98 F (36.7 C) (Tympanic)   Resp 16   Wt 171 lb 8.3 oz (77.8 kg)   BMI 32.41 kg/m  atient is status post left modified radical mastectomy. Chest wall is clear with some scarring. No sniffing nodularity or masses are noted. Right breast is free of dominant mass or nodularity in 2 positions examined. No axillary or supraclavicular adenopathy is appreciated. No evidence of lymphedema of her left upper extremity is noted.Well-developed well-nourished patient in NAD. HEENT reveals PERLA, EOMI, discs not visualized.  Oral cavity is clear. No oral mucosal lesions are identified. Neck is clear without evidence of cervical or supraclavicular adenopathy. Lungs are clear to A&P. Cardiac  examination is essentially unremarkable with regular rate and rhythm without murmur rub or thrill. Abdomen is benign with no organomegaly or masses noted. Motor sensory and DTR levels are equal and symmetric in the upper and lower extremities. Cranial nerves II through XII are grossly intact. Proprioception is intact. No peripheral adenopathy or edema is identified. No motor or sensory levels are noted. Crude visual fields are within normal range.  RADIOLOGY RESULTS: CT scan is reviewed and compatible with the above-stated findings  PLAN: resent time she is doing well with no evidence of disease. I'm please were overall progress. I've asked to see her back in 6 months and will start once your follow-up visits. Patient knows to call at anytime with any concerns.  I would like to take this opportunity to thank you for allowing me to participate in the care of your patient.Noreene Filbert, MD

## 2018-03-04 ENCOUNTER — Ambulatory Visit: Payer: Medicare Other | Admitting: Family Medicine

## 2018-03-07 ENCOUNTER — Telehealth: Payer: Self-pay

## 2018-03-07 DIAGNOSIS — B0239 Other herpes zoster eye disease: Secondary | ICD-10-CM | POA: Diagnosis not present

## 2018-03-07 NOTE — Telephone Encounter (Signed)
Appointment reminder mailed today for 04/08/18.

## 2018-03-08 ENCOUNTER — Encounter: Payer: Self-pay | Admitting: Family Medicine

## 2018-03-08 ENCOUNTER — Ambulatory Visit (INDEPENDENT_AMBULATORY_CARE_PROVIDER_SITE_OTHER): Payer: Medicare Other | Admitting: Family Medicine

## 2018-03-08 DIAGNOSIS — N183 Chronic kidney disease, stage 3 unspecified: Secondary | ICD-10-CM

## 2018-03-08 DIAGNOSIS — Z171 Estrogen receptor negative status [ER-]: Secondary | ICD-10-CM

## 2018-03-08 DIAGNOSIS — C50412 Malignant neoplasm of upper-outer quadrant of left female breast: Secondary | ICD-10-CM | POA: Diagnosis not present

## 2018-03-08 DIAGNOSIS — J3089 Other allergic rhinitis: Secondary | ICD-10-CM | POA: Diagnosis not present

## 2018-03-08 DIAGNOSIS — E785 Hyperlipidemia, unspecified: Secondary | ICD-10-CM

## 2018-03-08 DIAGNOSIS — D649 Anemia, unspecified: Secondary | ICD-10-CM

## 2018-03-08 DIAGNOSIS — I700 Atherosclerosis of aorta: Secondary | ICD-10-CM

## 2018-03-08 DIAGNOSIS — E669 Obesity, unspecified: Secondary | ICD-10-CM

## 2018-03-08 MED ORDER — SALINE SPRAY 0.65 % NA SOLN: 1.0000 | NASAL | 11 refills | Status: DC | PRN

## 2018-03-08 NOTE — Assessment & Plan Note (Addendum)
Avoid NSAIDs; hydrate; reviewed last several Cr and GFR values, stable

## 2018-03-08 NOTE — Assessment & Plan Note (Signed)
Check lipids, avoid saturated fats as much as possible

## 2018-03-08 NOTE — Assessment & Plan Note (Signed)
Stable, reviewed last several CBC readings, monitored by heme-onc

## 2018-03-08 NOTE — Patient Instructions (Addendum)
Check out the information at familydoctor.org entitled "Nutrition for Weight Loss: What You Need to Know about Fad Diets" Try to lose between 1-2 pounds per week by taking in fewer calories and burning off more calories You can succeed by limiting portions, limiting foods dense in calories and fat, becoming more active, and drinking 8 glasses of water a day (64 ounces) Don't skip meals, especially breakfast, as skipping meals may alter your metabolism Do not use over-the-counter weight loss pills or gimmicks that claim rapid weight loss A healthy BMI (or body mass index) is between 18.5 and 24.9 You can calculate your ideal BMI at the NIH website http://www.nhlbi.nih.gov/health/educational/lose_wt/BMI/bmicalc.htm  If you need something for aches or pains, try to use Tylenol (acetaminophen) instead of non-steroidals (which include Aleve, ibuprofen, Advil, Motrin, and naproxen); non-steroidals can cause long-term kidney damage  Try to limit saturated fats in your diet (bologna, hot dogs, barbeque, cheeseburgers, hamburgers, steak, bacon, sausage, cheese, etc.) and get more fresh fruits, vegetables, and whole grains   

## 2018-03-08 NOTE — Assessment & Plan Note (Addendum)
Patient does not want to take a statin; will check lipids today; goal LDL less than 70

## 2018-03-08 NOTE — Assessment & Plan Note (Signed)
Managed by oncologist 

## 2018-03-08 NOTE — Assessment & Plan Note (Signed)
Praise given for her efforts to eat better and lose weight

## 2018-03-08 NOTE — Progress Notes (Signed)
BP (!) 110/52 (BP Location: Right Arm, Patient Position: Sitting, Cuff Size: Large)   Pulse 79   Temp 98.4 F (36.9 C) (Oral)   Resp 16   Ht 5\' 1"  (1.549 m)   Wt 163 lb 11.2 oz (74.3 kg)   SpO2 95%   BMI 30.93 kg/m    Subjective:    Patient ID: Maria Patterson, female    DOB: 03-01-1941, 77 y.o.   MRN: [id number]  HPI: Maria Patterson is a 77 y.o. female  Chief Complaint  Patient presents with  . Follow-up    4 mth f/u - patient stated that the abdominal pain & urinary issues has resolved   . Sleeping Problem    patient stated that it is "so so" she has not been using the sleeping pills  . Obesity    patient stated that she is trying to lose weight. she has started cutting back, eating more fruits and vegetables. no fried foods, sodas or coffee and limited amounts of meats.  . Referral    C3 - financial issues and she needs a rail to help steady here while getting in and out of the bath tub.    HPI Patient is here for f/u  CKD, stage 3; last Cr was 1.16, last GFR 51 in June  High cholesterol; not much fatty meats; really cleaned up the diet and working on weight loss; changed her diet 3 months ago; just cereal every morning; fresh or frozen veggies; does not take statins; has not taken them for a while; has some atorvastatin left over, might take one every now and then, none in the last month; just wants to leave all that alone Lab Results  Component Value Date   CHOL 303 (H) 08/09/2017   HDL 82 08/09/2017   LDLCALC 192 (H) 08/09/2017   TRIG 135 08/09/2017   CHOLHDL 3.7 08/09/2017   Aortic atherosclerosis; had an appt with vascular doctor, PVD; she canceled that appt; wearing support hose; feet don't get cold or purple  Obesity; trying to lose weight; is down about 7 pounds over the last month; had been eating some bad stuff, and now just vegetables; gave up coffee and sweets and eggs; just doesn't eat the bad stuff now; just fruit; no sodas  Insomnia; not using the  sleep medicine; sleeps for an hour, then back up again; reads when she gets awake; she is taking naps during the day, then naps during the day and can't sleep at night; she'll drop off and go to sleep watching TV during the day, then can't stay asleep at night; knows that is what it is going on  Referral for assistance, wants a rail to help steady herself getting in and out of the tub  Breast cancer; managed by oncologist; she just saw Dr. Massie Maroon, turned her loose for 6 months or so; removed the port and it is healing up well  Chronic anemia; last H/H 11.3/32.9  She was seen for rash; it is gone and healing up; got up around the eye; saw the eye specialist; did not see the dermatologist; continuing on prednisone taper  Depression screen [facility] 2/9 03/08/2018 02/28/2018 02/25/2018 01/27/2018 11/02/2017  Decreased Interest 0 0 0 1 0  Down, Depressed, Hopeless 0 0 0 1 1  PHQ - 2 Score 0 0 0 2 1  Altered sleeping 3 0 - 0 -  Tired, decreased energy 0 0 - 0 -  Change in appetite 0 0 - 1 -  Feeling bad or failure about yourself  0 0 - 0 -  Trouble concentrating 0 0 - 0 -  Moving slowly or fidgety/restless 0 0 - 0 -  Suicidal thoughts 0 0 - 0 -  PHQ-9 Score 3 0 - 3 -  Difficult doing work/chores Not difficult at all Not difficult at all - Not difficult at all -    Relevant past medical, surgical, family and social history reviewed Past Medical History:  Diagnosis Date  . Anemia   . Breast cancer ([place])   . Breast mass, left 09/22/2016   RECOMMENDATION: Ultrasound-guided core biopsies of masses at left breast 2 o'clock 2 cm from nipple, left breast 2 o'clock 5 cm from nipple, abnormal left axillary lymph nodes.  Marland Kitchen GERD (gastroesophageal reflux disease)   . Hyperglycemia   . Hyperlipidemia   . Obesity   . Postmenopausal    Past Surgical History:  Procedure Laterality Date  . ABDOMINAL HYSTERECTOMY    . AXILLARY SENTINEL NODE BIOPSY Left 04/14/2017   Procedure: AXILLARY SENTINEL NODE BIOPSY;   Surgeon: Olean Ree, MD;  Location: [facility] ORS;  Service: General;  Laterality: Left;  . BREAST BIOPSY Left 09/30/2016   US biopsy of 3 areas + chemo  . BREAST EXCISIONAL BIOPSY Left 04/14/2017   Left breast invasive cancer left mastectomy  . ESOPHAGOGASTRODUODENOSCOPY (EGD) WITH PROPOFOL N/A 12/24/2014   Procedure: ESOPHAGOGASTRODUODENOSCOPY (EGD) WITH PROPOFOL;  Surgeon: Manya Silvas, MD;  Location: [facility] ENDOSCOPY;  Service: Endoscopy;  Laterality: N/A;  . MASTECTOMY Left 04/14/2017   Left breast invasive cancer  . PORTACATH PLACEMENT Right 10/27/2016   Procedure: INSERTION PORT-A-CATH;  Surgeon: Nestor Lewandowsky, MD;  Location: [facility] ORS;  Service: General;  Laterality: Right;  . TONSILECTOMY, ADENOIDECTOMY, BILATERAL MYRINGOTOMY AND TUBES    . TONSILLECTOMY    . TOTAL MASTECTOMY Left 04/14/2017   Procedure: TOTAL MASTECTOMY;  Surgeon: Olean Ree, MD;  Location: [facility] ORS;  Service: General;  Laterality: Left;   Family History  Problem Relation Age of Onset  . Stroke Mother   . Stroke Father   . Cancer Neg Hx   . Depression Neg Hx    Social History   Tobacco Use  . Smoking status: Never Smoker  . Smokeless tobacco: Never Used  . Tobacco comment: smoking cessation materials not required  Substance Use Topics  . Alcohol use: No    Alcohol/week: 0.0 standard drinks  . Drug use: No    Interim medical history since last visit reviewed. Allergies and medications reviewed  Review of Systems Per HPI unless specifically indicated above     Objective:    BP (!) 110/52 (BP Location: Right Arm, Patient Position: Sitting, Cuff Size: Large)   Pulse 79   Temp 98.4 F (36.9 C) (Oral)   Resp 16   Ht 5\' 1"  (1.549 m)   Wt 163 lb 11.2 oz (74.3 kg)   SpO2 95%   BMI 30.93 kg/m   Wt Readings from Last 3 Encounters:  03/08/18 163 lb 11.2 oz (74.3 kg)  03/02/18 171 lb 8.3 oz (77.8 kg)  02/28/18 170 lb 1.6 oz (77.2 kg)    Physical Exam  Constitutional: She appears well-developed  and well-nourished. No distress.  HENT:  Head: Normocephalic and atraumatic.  Eyes: EOM are normal. No scleral icterus.  Neck: No thyromegaly present.  Cardiovascular: Normal rate, regular rhythm and normal heart sounds.  No murmur heard. Pulmonary/Chest: Effort normal and breath sounds normal. No respiratory distress. She has no wheezes.  Abdominal: Soft. Bowel sounds are normal. She exhibits no distension.  Musculoskeletal: She exhibits no edema.  Neurological: She is alert.  Skin: Skin is warm and dry. She is not diaphoretic. No pallor.  Psychiatric: She has a normal mood and affect. Her behavior is normal. Judgment and thought content normal.    Results for orders placed or performed during the hospital encounter of 01/15/18  CBC  Result Value Ref Range   WBC 4.2 3.6 - 11.0 K/uL   RBC 3.53 (L) 3.80 - 5.20 MIL/uL   Hemoglobin 11.3 (L) 12.0 - 16.0 g/dL   HCT 32.9 (L) 35.0 - 47.0 %   MCV 93.2 80.0 - 100.0 fL   MCH 31.9 26.0 - 34.0 pg   MCHC 34.2 32.0 - 36.0 g/dL   RDW 14.8 (H) 11.5 - 14.5 %   Platelets 174 150 - 440 K/uL  Comprehensive metabolic panel  Result Value Ref Range   Sodium 141 135 - 145 mmol/L   Potassium 4.2 3.5 - 5.1 mmol/L   Chloride 113 (H) 98 - 111 mmol/L   CO2 21 (L) 22 - 32 mmol/L   Glucose, Bld 94 70 - 99 mg/dL   BUN 25 (H) 8 - 23 mg/dL   Creatinine, Ser 1.16 (H) 0.44 - 1.00 mg/dL   Calcium 8.9 8.9 - 10.3 mg/dL   Total Protein 6.9 6.5 - 8.1 g/dL   Albumin 3.7 3.5 - 5.0 g/dL   AST 25 15 - 41 U/L   ALT 18 0 - 44 U/L   Alkaline Phosphatase 108 38 - 126 U/L   Total Bilirubin 0.3 0.3 - 1.2 mg/dL   GFR calc non Af Amer 44 (L) >60 mL/min   GFR calc Af Amer 51 (L) >60 mL/min   Anion gap 7 5 - 15  Troponin I  Result Value Ref Range   Troponin I <0.03 <0.03 ng/mL      Assessment & Plan:   Problem List Items Addressed This Visit      Cardiovascular and Mediastinum   Aortic atherosclerosis ([place])    Patient does not want to take a statin; will check  lipids today; goal LDL less than 70        Respiratory   Allergic rhinitis   Relevant Medications   sodium chloride (OCEAN) 0.65 % SOLN nasal spray     Genitourinary   Chronic renal disease, stage III (HCC)    Avoid NSAIDs; hydrate; reviewed last several Cr and GFR values, stable        Other   Obesity (BMI 30.0-34.9)    Praise given for her efforts to eat better and lose weight      Hyperlipidemia LDL goal <100    Check lipids, avoid saturated fats as much as possible      Relevant Orders   Lipid panel   Breast cancer, left ([place])    Managed by oncologist      Anemia    Stable, reviewed last several CBC readings, monitored by heme-onc          Follow up plan: Return in about 6 months (around 09/08/2018) for follow-up visit with Dr. Sanda Klein (or just after).  An after-visit summary was printed and given to the patient at [place].  Please see the patient instructions which may contain other information and recommendations beyond what is mentioned above in the assessment and plan.  Meds ordered this encounter  Medications  . sodium chloride (OCEAN) 0.65 % SOLN nasal spray  Sig: Place 1 spray into both nostrils as needed for congestion.    Dispense:  60 mL    Refill:  11    Orders Placed This Encounter  Procedures  . Lipid panel

## 2018-04-04 IMAGING — CR DG CHEST 2V
2 series · 2 of 2 positions shown · non-contrast
Comparison: 10/20/2016 and 12/11/2011

CLINICAL DATA: Left breast mass

EXAM:
CHEST  2 VIEW

[chest pa]
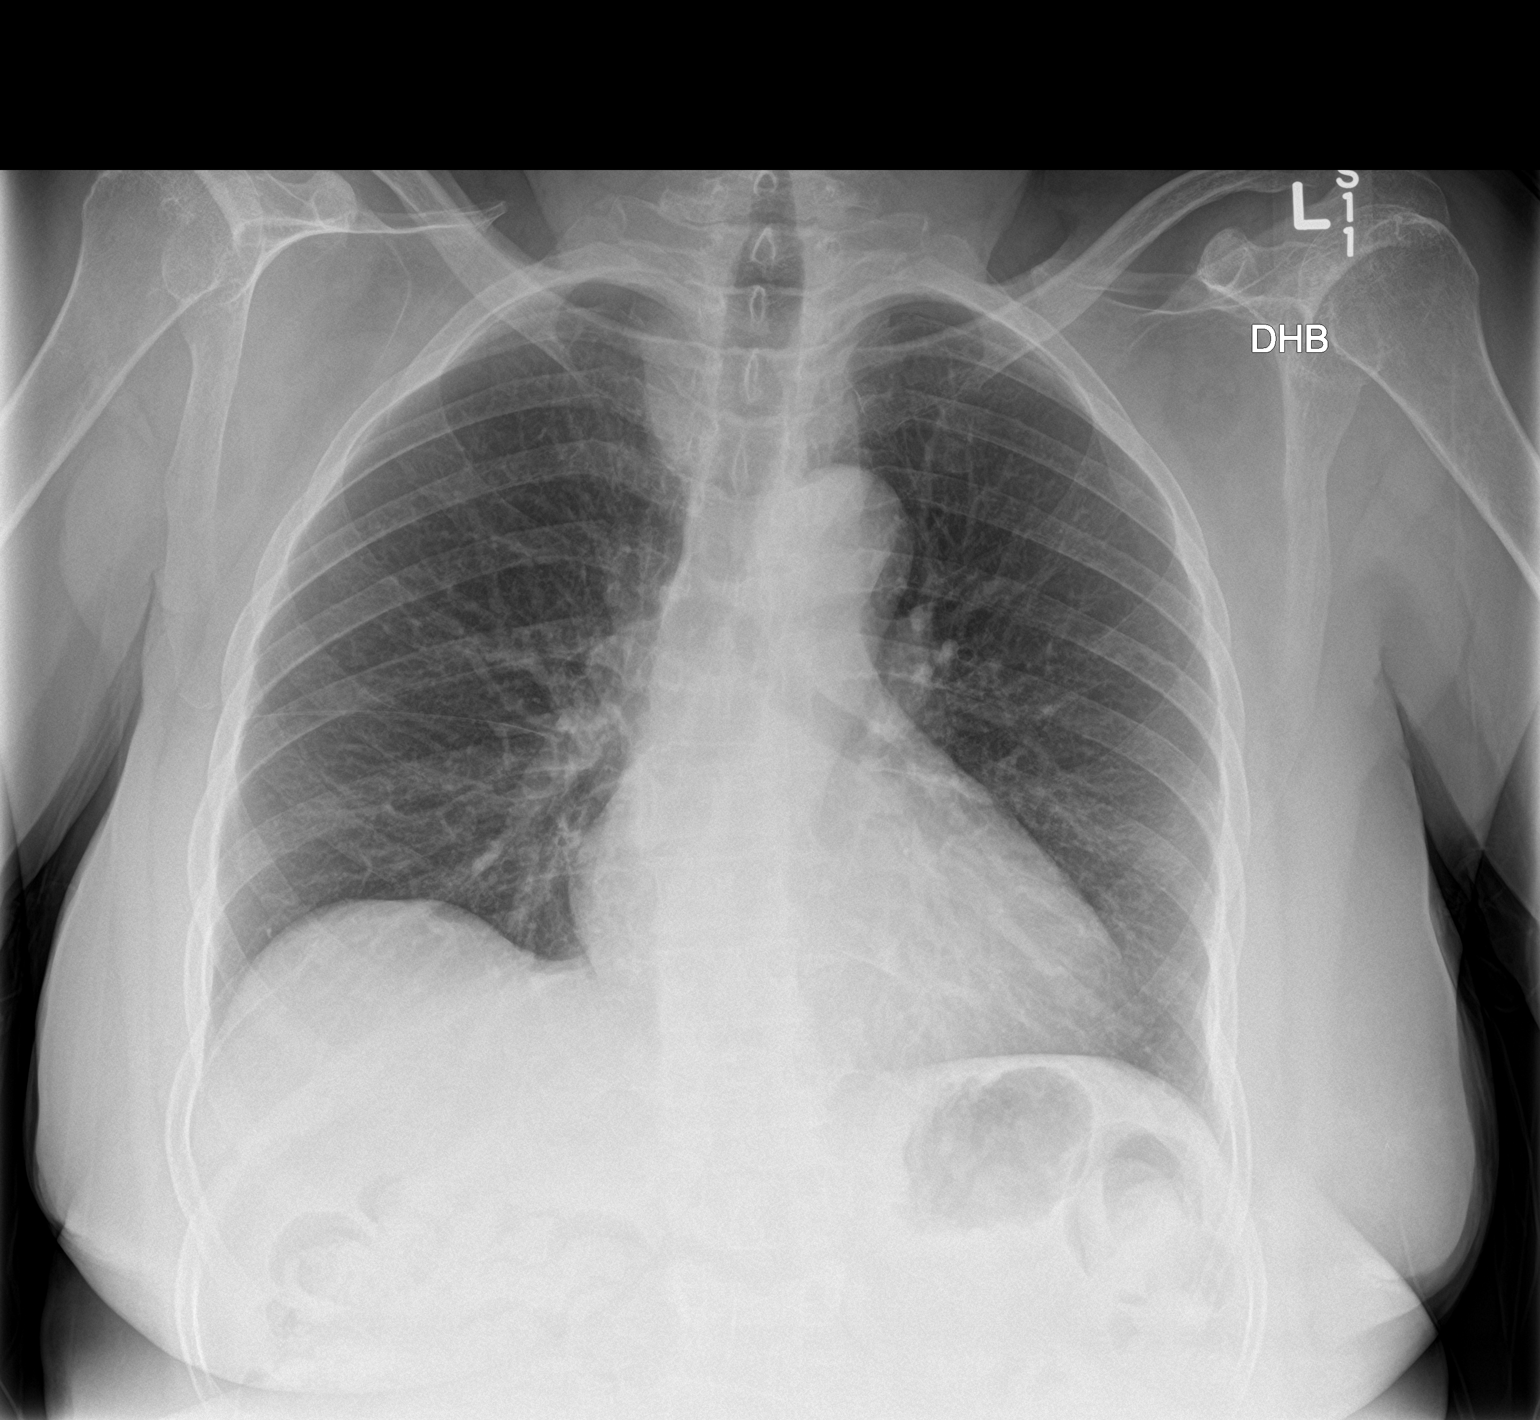

[chest lat]
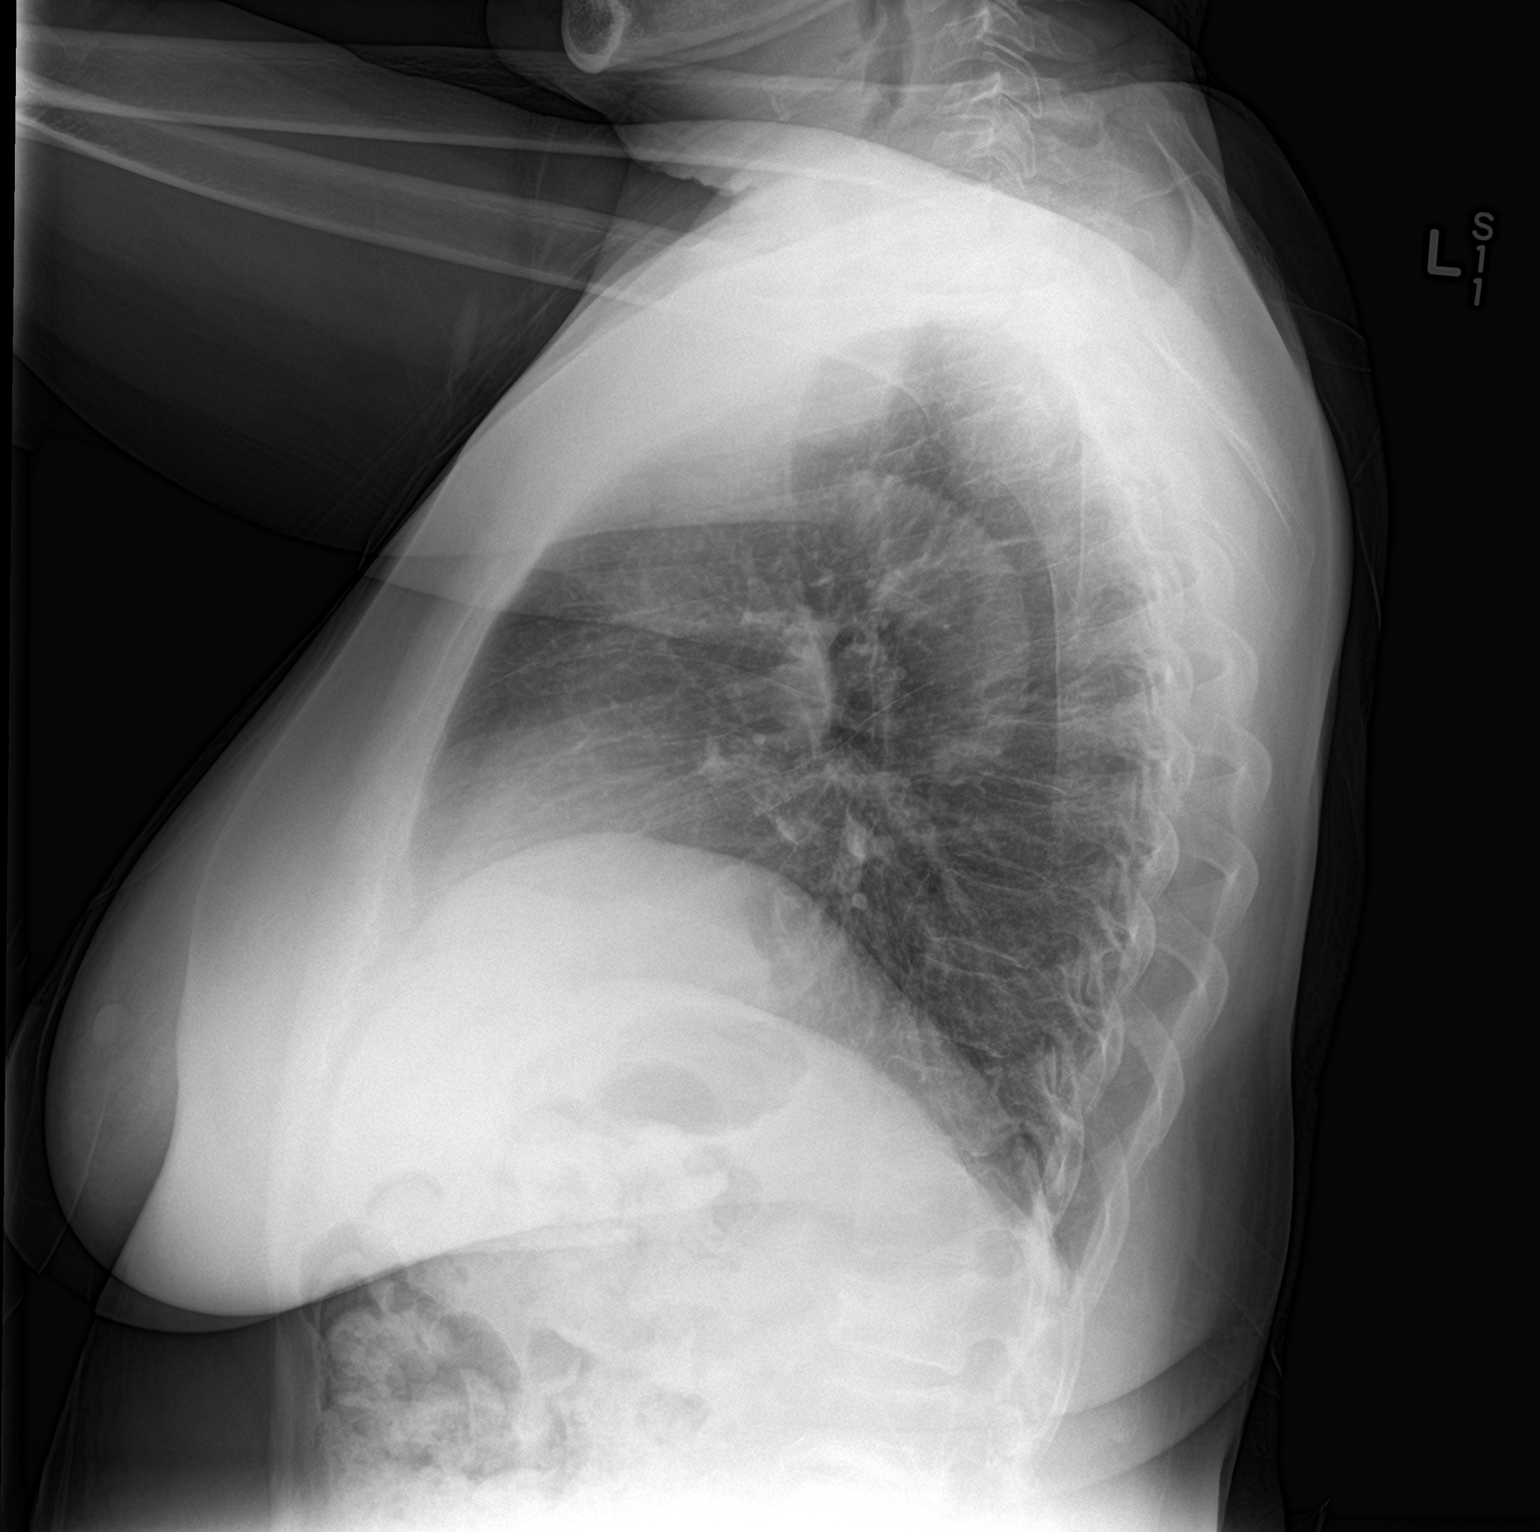

[2 of 2 positions shown; findings below may reference images not displayed]

FINDINGS: Cardiomediastinal silhouette is stable. No infiltrate or pleural
effusion. No pulmonary edema. Mild elevation of the right
hemidiaphragm again noted. Bony thorax is unremarkable.
IMPRESSION: No active cardiopulmonary disease.

## 2018-04-08 ENCOUNTER — Encounter: Payer: Self-pay | Admitting: Surgery

## 2018-04-08 ENCOUNTER — Other Ambulatory Visit: Payer: Self-pay

## 2018-04-08 ENCOUNTER — Ambulatory Visit: Payer: Medicare Other | Admitting: Surgery

## 2018-04-08 VITALS — BP 124/77 | HR 85 | Temp 97.9°F | Resp 12 | Ht 61.0 in | Wt 171.8 lb

## 2018-04-08 DIAGNOSIS — C50412 Malignant neoplasm of upper-outer quadrant of left female breast: Secondary | ICD-10-CM | POA: Diagnosis not present

## 2018-04-08 DIAGNOSIS — Z171 Estrogen receptor negative status [ER-]: Secondary | ICD-10-CM

## 2018-04-08 IMAGING — DX DG CHEST 1V PORT
1 series · 1 of 1 positions shown · non-contrast
Comparison: PA and lateral chest 10/23/2016.

CLINICAL DATA: Status post Port-A-Cath insertion.

EXAM:
PORTABLE CHEST 1 VIEW

[chest ap]
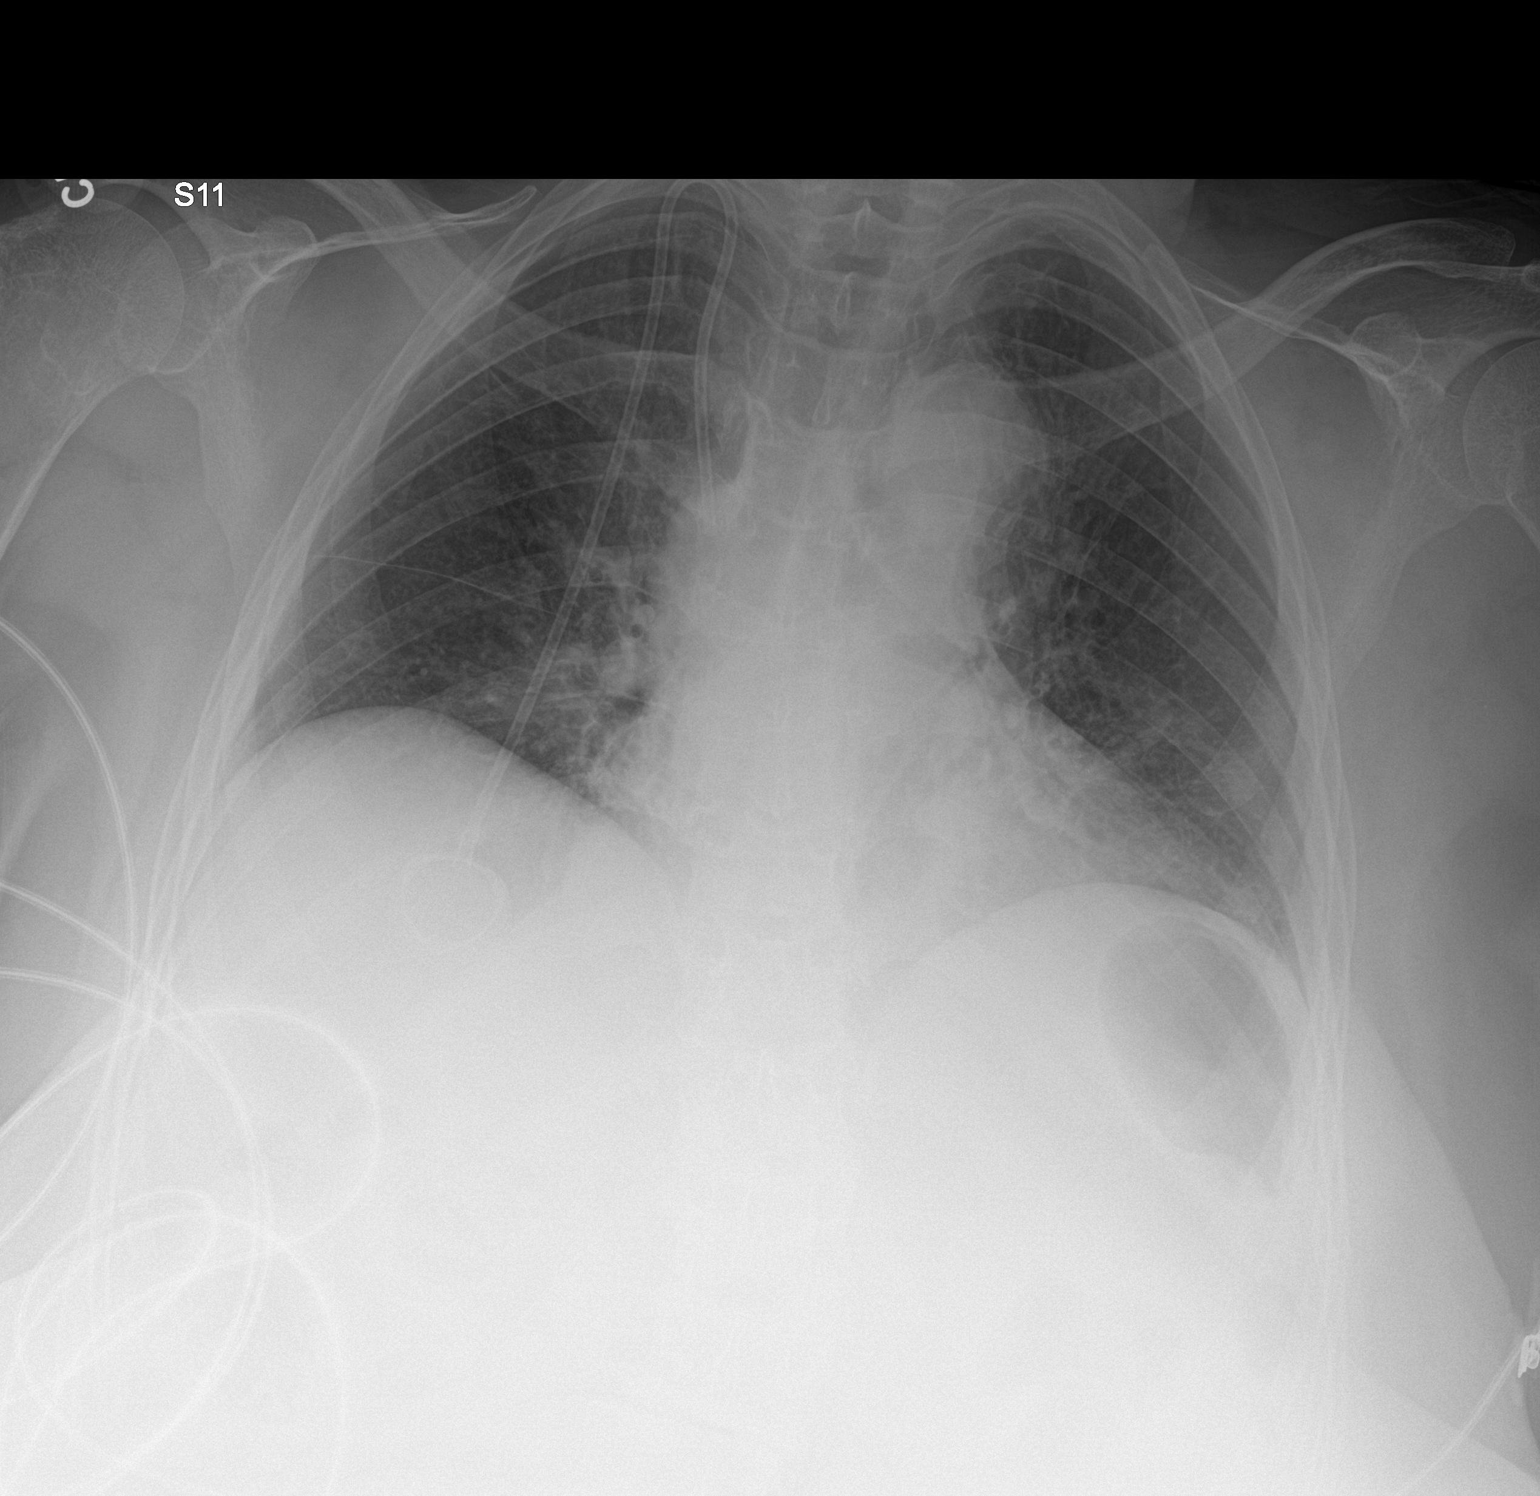

[1 of 1 positions shown; findings below may reference images not displayed]

FINDINGS: Right IJ approach Port-A-Cath is in place with the tip projecting in
the mid to lower superior vena cava. No pneumothorax. Lung volumes
are low with some basilar atelectasis. No pleural effusion. Heart
size is upper normal. Aortic atherosclerosis is noted.
IMPRESSION: Port-A-Cath tip projects in the mid to lower superior vena cava.
Negative for pneumothorax or other acute abnormality.

## 2018-04-08 NOTE — Patient Instructions (Addendum)
Patient needs to have right breast mammogram and return in 6 months.

## 2018-04-10 ENCOUNTER — Encounter: Payer: Self-pay | Admitting: Surgery

## 2018-04-10 NOTE — Progress Notes (Signed)
04/10/2018  History of Present Illness: Maria Patterson is a 77 y.o. female with a history of left breast cancer, s/p neoadjuvant chemotherapy and left mastectomy with sentinel node biopsy on 04/14/17.  She presents today for one year follow up.  She had a mammogram of the right breast in March 2019.  She saw Dr. Baruch Gouty with radiation oncology on 03/02/18 and Dr. Grayland Ormond on 02/04/18.  Her port-a-cath has been removed as well as she was having some pain associated with it.  Overall the patient reports doing well, without new symptoms or masses.  She reports intermittent discomfort around the left chest/breast area but she is able to tolerate it.    Past Medical History: Past Medical History:  Diagnosis Date  . Anemia   . Breast cancer ([place])   . Breast mass, left 09/22/2016   RECOMMENDATION: Ultrasound-guided core biopsies of masses at left breast 2 o'clock 2 cm from nipple, left breast 2 o'clock 5 cm from nipple, abnormal left axillary lymph nodes.  Marland Kitchen GERD (gastroesophageal reflux disease)   . Hyperglycemia   . Hyperlipidemia   . Obesity   . Postmenopausal      Past Surgical History: Past Surgical History:  Procedure Laterality Date  . ABDOMINAL HYSTERECTOMY    . AXILLARY SENTINEL NODE BIOPSY Left 04/14/2017   Procedure: AXILLARY SENTINEL NODE BIOPSY;  Surgeon: Olean Ree, MD;  Location: [facility] ORS;  Service: General;  Laterality: Left;  . BREAST BIOPSY Left 09/30/2016   US biopsy of 3 areas + chemo  . BREAST EXCISIONAL BIOPSY Left 04/14/2017   Left breast invasive cancer left mastectomy  . ESOPHAGOGASTRODUODENOSCOPY (EGD) WITH PROPOFOL N/A 12/24/2014   Procedure: ESOPHAGOGASTRODUODENOSCOPY (EGD) WITH PROPOFOL;  Surgeon: Manya Silvas, MD;  Location: [facility] ENDOSCOPY;  Service: Endoscopy;  Laterality: N/A;  . MASTECTOMY Left 04/14/2017   Left breast invasive cancer  . PORTACATH PLACEMENT Right 10/27/2016   Procedure: INSERTION PORT-A-CATH;  Surgeon: Nestor Lewandowsky, MD;  Location: [facility]  ORS;  Service: General;  Laterality: Right;  . TONSILECTOMY, ADENOIDECTOMY, BILATERAL MYRINGOTOMY AND TUBES    . TONSILLECTOMY    . TOTAL MASTECTOMY Left 04/14/2017   Procedure: TOTAL MASTECTOMY;  Surgeon: Olean Ree, MD;  Location: [facility] ORS;  Service: General;  Laterality: Left;    Home Medications: Prior to Admission medications   Medication Sig Start Date End Date Taking? Authorizing Provider  acetaminophen (TYLENOL) 325 MG tablet Take 2 tablets (650 mg total) by mouth every 6 (six) hours as needed. 01/15/18  Yes Carrie Mew, MD  aspirin 81 MG tablet Take 81 mg by mouth daily.   Yes [provider]  Cholecalciferol (VITAMIN D) 2000 units tablet Take 2,000 Units by mouth 3 (three) times a week. One by mouth twice a week 09/01/16  Yes Lada, Satira Anis, MD  ferrous sulfate 325 (65 FE) MG EC tablet Take 325 mg by mouth 3 (three) times a week.  09/01/16  Yes Lada, Satira Anis, MD  fluticasone (FLONASE) 50 MCG/ACT nasal spray Place 1 spray into both nostrils daily as needed for allergies or rhinitis. 09/20/17  Yes Lada, Satira Anis, MD  levocetirizine (XYZAL) 5 MG tablet Take 0.5 tablets (2.5 mg total) by mouth every evening. 01/28/18  Yes Poulose, Bethel Born, NP  Multiple Vitamin (MULTIVITAMIN WITH MINERALS) TABS tablet Take 1 tablet by mouth 3 (three) times a week.  09/01/16  Yes Lada, Satira Anis, MD  naproxen (NAPROSYN) 250 MG tablet Take 1 tablet (250 mg total) by mouth 2 (two) times daily with  a meal. 01/15/18  Yes Carrie Mew, MD  ondansetron (ZOFRAN ODT) 4 MG disintegrating tablet Take 1 tablet (4 mg total) by mouth every 8 (eight) hours as needed for nausea or vomiting. 01/15/18  Yes Carrie Mew, MD  oxyCODONE-acetaminophen (PERCOCET/ROXICET) 5-325 MG tablet Take 1 tablet by mouth as needed for severe pain.   Yes [provider]  Polyethyl Glycol-Propyl Glycol (SYSTANE OP) Place 1 drop into both eyes daily as needed (dry eyes).    Yes [provider]   predniSONE (DELTASONE) 10 MG tablet Take 1 tablet (10 mg total) by mouth daily. Day 1-3: take 4 tablets PO daily Day 4-6: take 3 tablets PO daily Day 7-9: take 2 tablets PO daily Day 10-12: take 1 tablet PO daily 02/27/18  Yes Paduchowski, Lennette Bihari, MD  sodium chloride (OCEAN) 0.65 % SOLN nasal spray Place 1 spray into both nostrils as needed for congestion. 03/08/18  Yes Lada, Satira Anis, MD  SSD 1 % cream APPLY CREAM TOPICALLY TWICE DAILY 03/24/18   [provider]    Allergies: Allergies  Allergen Reactions  . Latex Itching  . Penicillins Itching and Rash    Has patient had a PCN reaction causing immediate rash, facial/tongue/throat swelling, SOB or lightheadedness with hypotension: Yes Has patient had a PCN reaction causing severe rash involving mucus membranes or skin necrosis: No Has patient had a PCN reaction that required hospitalization No Has patient had a PCN reaction occurring within the last 10 years: No If all of the above answers are "NO", then may proceed with Cephalosporin use.     Review of Systems: Review of Systems  Constitutional: Negative for chills and fever.  HENT: Negative for hearing loss.   Eyes: Negative for blurred vision.  Respiratory: Negative for shortness of breath.   Cardiovascular: Negative for chest pain.  Gastrointestinal: Negative for abdominal pain and nausea.  Genitourinary: Negative for dysuria.  Musculoskeletal: Negative for myalgias.  Skin: Negative for rash.  Neurological: Negative for dizziness.  Psychiatric/Behavioral: Negative for depression.    Physical Exam BP 124/77   Pulse 85   Temp 97.9 F (36.6 C) (Temporal)   Resp 12   Ht 5\' 1"  (1.549 m)   Wt 171 lb 12.8 oz (77.9 kg)   SpO2 98%   BMI 32.46 kg/m  CONSTITUTIONAL: No acute distress HEENT:  Normocephalic, atraumatic, extraocular motion intact. RESPIRATORY:  Lungs are clear, and breath sounds are equal bilaterally. Normal respiratory effort without pathologic use of  accessory muscles. CARDIOVASCULAR: Heart is regular without murmurs, gallops, or rubs. BREAST:  Right breast without any palpable masses, skin changes, nipple discharge or retraction.  No right axillary lymphadenopathy.  Left breast with well healed scar from mastectomy as well as scar from sentinel node biopsy.  There is one small nodule towards the mid to lateral aspect of the scar that is stable compared to prior exam and CT scan of chest.  No left axillary lymphadenopathy. GI: The abdomen is soft, non-distended, non-tender.  NEUROLOGIC:  Motor and sensation is grossly normal.  Cranial nerves are grossly intact. PSYCH:  Alert and oriented to person, place and time. Affect is normal.  Labs/Imaging: Mammogram 09/24/17: FINDINGS: The patient has had a left mastectomy. There are no findings suspicious for malignancy. Images were processed with CAD.  IMPRESSION: No mammographic evidence of malignancy. A result letter of this screening mammogram will be mailed directly to the patient.  RECOMMENDATION: Screening mammogram in one year.  (Code:[id number]-R-33M)   CT scan Chest 01/15/18: IMPRESSION:  1. The right jugular porta catheter tip is in the left innominate vein. 2.  Calcific coronary artery and aortic atherosclerosis. 3. 5 mm right lobe thyroid nodule, too small to characterize, but most likely benign in the absence of known clinical risk factors for thyroid carcinoma. 4. No acute abnormality.   Assessment and Plan: This is a 77 y.o. female with history of left breast cancer s/p neoadjuvant chemotherapy, left mastectomy with sentinel node biopsy.  She's now 1 year out of surgery and is doing well.  --No evidence of any recurrence both on imaging studies and physical exam.   --Patient will follow up in 6 months and will be due for her yearly mammogram at that point.  Face-to-face time spent with the patient and care providers was 15 minutes, with more than 50% of the time spent counseling,  educating, and coordinating care of the patient.     Melvyn Neth, [facility]

## 2018-05-04 DIAGNOSIS — H5789 Other specified disorders of eye and adnexa: Secondary | ICD-10-CM | POA: Diagnosis not present

## 2018-05-09 DIAGNOSIS — E785 Hyperlipidemia, unspecified: Secondary | ICD-10-CM | POA: Diagnosis not present

## 2018-05-09 LAB — LIPID PANEL
Cholesterol: 371 mg/dL — ABNORMAL HIGH (ref <200)
HDL: 59 mg/dL (ref >50)
LDL Cholesterol (Calc): 288 mg/dL (calc) — ABNORMAL HIGH
NON-HDL CHOLESTEROL (CALC): 312 mg/dL — AB (ref <130)
TRIGLYCERIDES: 109 mg/dL (ref <150)
Total CHOL/HDL Ratio: 6.3 (calc) — ABNORMAL HIGH (ref <5.0)

## 2018-05-11 ENCOUNTER — Telehealth: Payer: Self-pay

## 2018-05-11 DIAGNOSIS — E785 Hyperlipidemia, unspecified: Secondary | ICD-10-CM

## 2018-05-11 NOTE — Telephone Encounter (Signed)
-----   Message from Arnetha Courser, MD sent at 05/10/2018  5:22 PM EDT ----- Please REFER to ENDO for very high total cholesterol and LDL cholesterol (her LDL is 288). Let patient know that we'll have her see a specialist for her cholesterol since we cannot get this down together. Thank you

## 2018-06-01 ENCOUNTER — Ambulatory Visit: Payer: Self-pay | Admitting: *Deleted

## 2018-06-01 NOTE — Telephone Encounter (Signed)
Patient going to urgent care.

## 2018-06-01 NOTE — Telephone Encounter (Signed)
Pt reports "mild" swelling left arm from elbow to wrist. Onset 2 weeks ago. Reports 2/10 intermittent pain, area "Slight pinkish" and "A little bit warmer than my other arm." Also reports area "Hardened at places." States has taken Tylenol at night, effective. States symptoms have not worsened over past 2 weeks. H/O left mastectomy 2018.  Denies any SOB, CP. Pt directed to UC given redness and warmth. Requesting appt and states will go to UC if worsens. "I think it's arthritis." Next available appt made with E. Uvaldo Rising for Friday. Upon further review of pt's history, reinforced need to go to UC. [place] called practice, spoke with Cassandra who reiterated need for disposition. Pt aware, states will follow disposition, going to [facility].  Instructed to alert practice if she needs to cancel appt with E. Uvaldo Rising on Friday.  Reason for Disposition . [1] Red area or streak [2] large (> 2 in. or 5 cm)  Answer Assessment - Initial Assessment Questions 1. ONSET: "When did the swelling start?" (e.g., minutes, hours, days)     2 weeks ago 2. LOCATION: "What part of the arm is swollen?"  "Are both arms swollen or just one arm?"     Left, from elbow to wrist 3. SEVERITY: "How bad is the swelling?" (e.g., localized; mild, moderate, severe)   - LOCALIZED: Small area of puffiness or swelling on just one arm   - JOINT SWELLING: Swelling of one joint   - MILD: Puffiness or swelling of hand   - MODERATE: Puffiness or swollen feeling of entire arm    - SEVERE: All of arm looks swollen; pitting edema     Mild, from elbow to wrist. 4. REDNESS: "Does the swelling look red or infected?"     "Light pink" 5. PAIN: "Is the swelling painful to touch?" If so, ask: "How painful is it?"   (Scale 1-10; mild, moderate or severe)     Yes to touch, 2/10 6. FEVER: "Do you have a fever?" If so, ask: "What is it, how was it measured, and when did it start?"      no 7. CAUSE: "What do you think is causing the arm swelling?"  "Maybe arthritis at elbow." 8. MEDICAL HISTORY: "Do you have a history of heart failure, kidney disease, liver failure, or cancer?"     Left mastectomy 2018 9. RECURRENT SYMPTOM: "Have you had arm swelling before?" If so, ask: "When was the last time?" "What happened that time?"     no 10. OTHER SYMPTOMS: "Do you have any other symptoms?" (e.g., chest pain, difficulty breathing)       no  Protocols used: ARM SWELLING AND EDEMA-A-AH

## 2018-06-02 ENCOUNTER — Other Ambulatory Visit: Payer: Self-pay

## 2018-06-02 NOTE — Patient Outreach (Signed)
[place] [facility]) Care Management  06/02/2018  Maria Patterson Jun 22, 1941 [id number]   Medication Adherence call to Maria Patterson spoke with patient she is no longer taking Atorvastatin 40 mg she said doctor switch to something else. Maria Patterson is showing past due under [place].   [place] Management Direct Dial [phone]  Fax [phone] [email or web address]

## 2018-06-03 ENCOUNTER — Ambulatory Visit (INDEPENDENT_AMBULATORY_CARE_PROVIDER_SITE_OTHER): Payer: Medicare Other | Admitting: Family Medicine

## 2018-06-03 ENCOUNTER — Encounter: Payer: Self-pay | Admitting: Family Medicine

## 2018-06-03 ENCOUNTER — Ambulatory Visit
Admission: RE | Admit: 2018-06-03 | Discharge: 2018-06-03 | Disposition: A | Discharge status: Home / Self Care | Payer: Medicare Other | Source: Ambulatory Visit | Attending: Family Medicine | Admitting: Family Medicine

## 2018-06-03 ENCOUNTER — Telehealth: Payer: Self-pay | Admitting: Family Medicine

## 2018-06-03 VITALS — BP 120/78 | HR 97 | Temp 98.7°F | Resp 16 | Ht 61.0 in | Wt 169.3 lb

## 2018-06-03 DIAGNOSIS — Z171 Estrogen receptor negative status [ER-]: Secondary | ICD-10-CM

## 2018-06-03 DIAGNOSIS — M7989 Other specified soft tissue disorders: Secondary | ICD-10-CM | POA: Diagnosis not present

## 2018-06-03 DIAGNOSIS — Z9012 Acquired absence of left breast and nipple: Secondary | ICD-10-CM | POA: Diagnosis not present

## 2018-06-03 DIAGNOSIS — R600 Localized edema: Secondary | ICD-10-CM | POA: Diagnosis not present

## 2018-06-03 DIAGNOSIS — M79602 Pain in left arm: Secondary | ICD-10-CM

## 2018-06-03 DIAGNOSIS — C50412 Malignant neoplasm of upper-outer quadrant of left female breast: Secondary | ICD-10-CM | POA: Diagnosis not present

## 2018-06-03 DIAGNOSIS — L03114 Cellulitis of left upper limb: Secondary | ICD-10-CM

## 2018-06-03 MED ORDER — DOXYCYCLINE HYCLATE 100 MG PO TABS: 100.0000 mg | ORAL_TABLET | Freq: Two times a day (BID) | ORAL | 0 refills | Status: DC

## 2018-06-03 NOTE — Progress Notes (Signed)
Name: Maria Patterson   MRN: [id number]    DOB: Mar 13, 1941   Date:06/03/2018       Progress Note  Subjective  Chief Complaint  Chief Complaint  Patient presents with  . Arm Swelling    redness and swelling x 2 weeks.    HPI  Pt presents with 2 week history of LUE swelling, erythema, and tenderness.  She has LEFT total mastectomy by Dr. Hampton Abbot approx 1 year ago, has been doing well since with no other episodes of LUE edema.  She has not had any similar issues since the procedure.  No recent immobilization, no chest pain or shortness of breath, fevers, chills, red streaking up the arm or onto the chest, malaise/fatigue, or extremity weakness.  Patient Active Problem List   Diagnosis Date Noted  . Obesity (BMI 30.0-34.9) 03/08/2018  . Insomnia 09/26/2017  . Chronic renal disease, stage III ([facility]) 08/09/2017  . Hypomagnesemia 05/04/2017  . Aortic atherosclerosis ([place]) 05/04/2017  . Compression fracture of first lumbar vertebra ([place]) 05/04/2017  . Degenerative disc disease, lumbar 05/04/2017  . Breast cancer, left ([place]) 04/14/2017  . Tachycardia 02/01/2017  . Anemia 02/01/2017  . Atherosclerosis of native arteries of extremity with intermittent claudication ([place]) 11/09/2016  . Allergic rhinitis 11/07/2016  . Malignant neoplasm of upper-outer quadrant of left female breast ([place]) 10/08/2016  . Osteoporosis screening 09/18/2016  . Postmenopausal 09/18/2016  . Ache in joint 03/09/2016  . Medication monitoring encounter 10/30/2015  . GERD without esophagitis 07/01/2012  . Hyperlipidemia LDL goal <100 07/01/2012    Social History   Tobacco Use  . Smoking status: Never Smoker  . Smokeless tobacco: Never Used  . Tobacco comment: smoking cessation materials not required  Substance Use Topics  . Alcohol use: No    Alcohol/week: 0.0 standard drinks     Current Outpatient Medications:  .  acetaminophen (TYLENOL) 325 MG tablet, Take 2 tablets (650 mg total) by mouth every 6 (six)  hours as needed., Disp: 60 tablet, Rfl: 0 .  aspirin 81 MG tablet, Take 81 mg by mouth daily., Disp: , Rfl:  .  Cholecalciferol (VITAMIN D) 2000 units tablet, Take 2,000 Units by mouth 3 (three) times a week. One by mouth twice a week, Disp: , Rfl:  .  ferrous sulfate 325 (65 FE) MG EC tablet, Take 325 mg by mouth 3 (three) times a week. , Disp: , Rfl:  .  fluticasone (FLONASE) 50 MCG/ACT nasal spray, Place 1 spray into both nostrils daily as needed for allergies or rhinitis., Disp: 16 g, Rfl: 11 .  levocetirizine (XYZAL) 5 MG tablet, Take 0.5 tablets (2.5 mg total) by mouth every evening., Disp: 30 tablet, Rfl: 0 .  Multiple Vitamin (MULTIVITAMIN WITH MINERALS) TABS tablet, Take 1 tablet by mouth 3 (three) times a week. , Disp: , Rfl:  .  ondansetron (ZOFRAN ODT) 4 MG disintegrating tablet, Take 1 tablet (4 mg total) by mouth every 8 (eight) hours as needed for nausea or vomiting., Disp: 20 tablet, Rfl: 0 .  Polyethyl Glycol-Propyl Glycol (SYSTANE OP), Place 1 drop into both eyes daily as needed (dry eyes). , Disp: , Rfl:  .  sodium chloride (OCEAN) 0.65 % SOLN nasal spray, Place 1 spray into both nostrils as needed for congestion., Disp: 60 mL, Rfl: 11 .  SSD 1 % cream, APPLY CREAM TOPICALLY TWICE DAILY, Disp: , Rfl: 3 .  naproxen (NAPROSYN) 250 MG tablet, Take 1 tablet (250 mg total) by mouth 2 (two) times daily  with a meal. (Patient not taking: Reported on 06/03/2018), Disp: 40 tablet, Rfl: 0 .  oxyCODONE-acetaminophen (PERCOCET/ROXICET) 5-325 MG tablet, Take 1 tablet by mouth as needed for severe pain., Disp: , Rfl:  .  predniSONE (DELTASONE) 10 MG tablet, Take 1 tablet (10 mg total) by mouth daily. Day 1-3: take 4 tablets PO daily Day 4-6: take 3 tablets PO daily Day 7-9: take 2 tablets PO daily Day 10-12: take 1 tablet PO daily (Patient not taking: Reported on 06/03/2018), Disp: 30 tablet, Rfl: 0 No current facility-administered medications for this visit.   Facility-Administered Medications  Ordered in Other Visits:  .  heparin lock flush 100 unit/mL, 500 Units, Intravenous, Once, Finnegan, Kathlene November, MD  Allergies  Allergen Reactions  . Latex Itching  . Penicillins Itching and Rash    Has patient had a PCN reaction causing immediate rash, facial/tongue/throat swelling, SOB or lightheadedness with hypotension: Yes Has patient had a PCN reaction causing severe rash involving mucus membranes or skin necrosis: No Has patient had a PCN reaction that required hospitalization No Has patient had a PCN reaction occurring within the last 10 years: No If all of the above answers are "NO", then may proceed with Cephalosporin use.     I personally reviewed active problem list, medication list, allergies, notes from last encounter with the patient/caregiver today.  ROS  Ten systems reviewed and is negative except as mentioned in HPI.  Objective  Vitals:   06/03/18 0829  BP: 120/78  Pulse: 97  Resp: 16  Temp: 98.7 F (37.1 C)  TempSrc: Oral  SpO2: 98%  Weight: 169 lb 4.8 oz (76.8 kg)  Height: 5\' 1"  (1.549 m)    Body mass index is 31.99 kg/m.  Nursing Note and Vital Signs reviewed.  Physical Exam  Constitutional: Patient appears well-developed and well-nourished. No distress.  HENT: Head: Normocephalic and atraumatic.  Cardiovascular: Normal rate, regular rhythm and normal heart sounds.  No murmur heard. No BLE edema. Pulmonary/Chest: Effort normal and breath sounds normal. No respiratory distress. Abdominal: Soft. Bowel sounds are normal, no distension. There is no tenderness. No masses. Musculoskeletal: Normal range of motion, no joint effusions. No gross deformities. Elbow joint non-tender, full AROM, shoulder with full AROM, hand/wrist full AROM. Neurological: Pt is alert and oriented to person, place, and time. No cranial nerve deficit. Coordination, balance, strength, speech and gait are normal.  Skin: LEFT forearm is erythematous, with non-pitting edema and mild  tenderness; her left upper arm is mildly erythematous and mildly edematous, minimal tenderness. Psychiatric: Patient has a normal mood and affect. behavior is normal. Judgment and thought content normal.   No results found for this or any previous visit (from the past 72 hour(s)).  Assessment & Plan  1. Left arm swelling - US Venous Img Upper Uni Left; Future  2. Left arm pain - US Venous Img Upper Uni Left; Future  3. Malignant neoplasm of upper-outer quadrant of left breast in female, estrogen receptor negative ([place]) 4. H/O total mastectomy of left breast Consulted with Dr. Hampton Abbot who performed her mastectomy.  He agrees with plan of care to order STAT US to rule out DVT - this is scheduled for this afternoon. If DVT negative, will treat for cellulitis, and if not improving, will refer to cancer center for lymphedema clinic consultation.  -Red flags and when to present for emergency care or RTC including fever >101.43F, chest pain, shortness of breath, new/worsening/un-resolving symptoms, fevers/chills, extremity weakness, reviewed with patient at time of  visit. Follow up and care instructions discussed and provided in AVS.  Face-to-face time with patient was more than 25 minutes, >50% time spent counseling and coordination of care

## 2018-06-03 NOTE — Telephone Encounter (Signed)
Please call to schedule follow up on either 11/19 or 11/20

## 2018-06-06 NOTE — Telephone Encounter (Signed)
Spoke with pt and scheduled appt for 11.20.19 with Raquel Sarna

## 2018-06-08 ENCOUNTER — Ambulatory Visit (INDEPENDENT_AMBULATORY_CARE_PROVIDER_SITE_OTHER): Payer: Medicare Other | Admitting: Family Medicine

## 2018-06-08 ENCOUNTER — Encounter: Payer: Self-pay | Admitting: Family Medicine

## 2018-06-08 ENCOUNTER — Ambulatory Visit: Payer: Self-pay | Admitting: *Deleted

## 2018-06-08 VITALS — BP 130/82 | HR 97 | Temp 98.3°F | Resp 14 | Ht 61.0 in | Wt 168.0 lb

## 2018-06-08 DIAGNOSIS — M7989 Other specified soft tissue disorders: Secondary | ICD-10-CM

## 2018-06-08 DIAGNOSIS — L03114 Cellulitis of left upper limb: Secondary | ICD-10-CM | POA: Diagnosis not present

## 2018-06-08 DIAGNOSIS — Z9012 Acquired absence of left breast and nipple: Secondary | ICD-10-CM

## 2018-06-08 MED ORDER — CIPROFLOXACIN HCL 500 MG PO TABS: 500.0000 mg | ORAL_TABLET | Freq: Two times a day (BID) | ORAL | 0 refills | Status: AC

## 2018-06-08 NOTE — Telephone Encounter (Signed)
Pt called regarding the medication that she was prescribed today (Cipro). She stated that she had read up on the medication, side effects and think that she would not be safe to take this. She has not taken any of the medication and would like to continue taking the doxycyline that she was prescribed on the 15 th. Would like a call back to see if another antibiotic can be called in or just keep taking the doxycycline. Her # is [phone]

## 2018-06-08 NOTE — Progress Notes (Signed)
Name: Maria Patterson   MRN: [id number]    DOB: 11/11/1940   Date:06/08/2018       Progress Note  Subjective  Chief Complaint  Chief Complaint  Patient presents with  . Follow-up    HPI  Pt presents to follow up on LEFT arm swelling and redness. She has been taking doxycline BID and has not noticed any improvement.  She had [place] for DVT rule out which was negative for DVT on 06/03/2018. She denies chest pain, shortness of breath, fevers, chills, malaise.  Does have very seldom numbness/tingling in the LEFT fingers that is very brief in duration. No LUE weakness; no red streaking up the arm/chest/back/shoulder.  She is s/p LEFT mastectomy, and Dr. Hampton Abbot was consulted via telephone at last visit, and he recommended DVT r/o, treat for cellulitis if DVT is negative, and send for lymphedema eval if still not improving.   Patient Active Problem List   Diagnosis Date Noted  . H/O total mastectomy of left breast 06/03/2018  . Obesity (BMI 30.0-34.9) 03/08/2018  . Insomnia 09/26/2017  . Chronic renal disease, stage III ([place]) 08/09/2017  . Hypomagnesemia 05/04/2017  . Aortic atherosclerosis ([place]) 05/04/2017  . Compression fracture of first lumbar vertebra ([place]) 05/04/2017  . Degenerative disc disease, lumbar 05/04/2017  . Tachycardia 02/01/2017  . Anemia 02/01/2017  . Atherosclerosis of native arteries of extremity with intermittent claudication ([place]) 11/09/2016  . Allergic rhinitis 11/07/2016  . Malignant neoplasm of upper-outer quadrant of left female breast ([place]) 10/08/2016  . Osteoporosis screening 09/18/2016  . Postmenopausal 09/18/2016  . Ache in joint 03/09/2016  . Medication monitoring encounter 10/30/2015  . GERD without esophagitis 07/01/2012  . Hyperlipidemia LDL goal <100 07/01/2012    Social History   Tobacco Use  . Smoking status: Never Smoker  . Smokeless tobacco: Never Used  . Tobacco comment: smoking cessation materials not required  Substance Use Topics  .  Alcohol use: No    Alcohol/week: 0.0 standard drinks     Current Outpatient Medications:  .  acetaminophen (TYLENOL) 325 MG tablet, Take 2 tablets (650 mg total) by mouth every 6 (six) hours as needed., Disp: 60 tablet, Rfl: 0 .  aspirin 81 MG tablet, Take 81 mg by mouth daily., Disp: , Rfl:  .  Cholecalciferol (VITAMIN D) 2000 units tablet, Take 2,000 Units by mouth 3 (three) times a week. One by mouth twice a week, Disp: , Rfl:  .  doxycycline (VIBRA-TABS) 100 MG tablet, Take 1 tablet (100 mg total) by mouth 2 (two) times daily for 10 days., Disp: 20 tablet, Rfl: 0 .  ferrous sulfate 325 (65 FE) MG EC tablet, Take 325 mg by mouth 3 (three) times a week. , Disp: , Rfl:  .  fluticasone (FLONASE) 50 MCG/ACT nasal spray, Place 1 spray into both nostrils daily as needed for allergies or rhinitis., Disp: 16 g, Rfl: 11 .  levocetirizine (XYZAL) 5 MG tablet, Take 0.5 tablets (2.5 mg total) by mouth every evening., Disp: 30 tablet, Rfl: 0 .  Multiple Vitamin (MULTIVITAMIN WITH MINERALS) TABS tablet, Take 1 tablet by mouth 3 (three) times a week. , Disp: , Rfl:  .  ondansetron (ZOFRAN ODT) 4 MG disintegrating tablet, Take 1 tablet (4 mg total) by mouth every 8 (eight) hours as needed for nausea or vomiting., Disp: 20 tablet, Rfl: 0 .  oxyCODONE-acetaminophen (PERCOCET/ROXICET) 5-325 MG tablet, Take 1 tablet by mouth as needed for severe pain., Disp: , Rfl:  .  Polyethyl Glycol-Propyl Glycol (  SYSTANE OP), Place 1 drop into both eyes daily as needed (dry eyes). , Disp: , Rfl:  .  sodium chloride (OCEAN) 0.65 % SOLN nasal spray, Place 1 spray into both nostrils as needed for congestion., Disp: 60 mL, Rfl: 11 .  SSD 1 % cream, APPLY CREAM TOPICALLY TWICE DAILY, Disp: , Rfl: 3 No current facility-administered medications for this visit.   Facility-Administered Medications Ordered in Other Visits:  .  heparin lock flush 100 unit/mL, 500 Units, Intravenous, Once, Finnegan, Kathlene November, MD  Allergies    Allergen Reactions  . Latex Itching  . Penicillins Itching and Rash    Has patient had a PCN reaction causing immediate rash, facial/tongue/throat swelling, SOB or lightheadedness with hypotension: Yes Has patient had a PCN reaction causing severe rash involving mucus membranes or skin necrosis: No Has patient had a PCN reaction that required hospitalization No Has patient had a PCN reaction occurring within the last 10 years: No If all of the above answers are "NO", then may proceed with Cephalosporin use.     I personally reviewed active problem list, medication list, allergies, notes from last encounter, lab results with the patient/caregiver today.  ROS  Constitutional: Negative for fever or weight change.  Respiratory: Negative for cough and shortness of breath.   Cardiovascular: Negative for chest pain or palpitations.  Gastrointestinal: Negative for abdominal pain, no bowel changes.  Musculoskeletal: Negative for gait problem or joint swelling.  Skin: Negative for rash. See HPI Neurological: Negative for dizziness or headache.  No other specific complaints in a complete review of systems (except as listed in HPI above).  Objective  Vitals:   06/08/18 0901  BP: 130/82  Pulse: 97  Resp: 14  Temp: 98.3 F (36.8 C)  TempSrc: Oral  SpO2: 97%  Weight: 168 lb (76.2 kg)  Height: 5\' 1"  (1.549 m)   Body mass index is 31.74 kg/m.  Nursing Note and Vital Signs reviewed.  Physical Exam  Constitutional: Patient appears well-developed and well-nourished. No distress.  HENT: Head: Normocephalic and atraumatic.  Neck: Normal range of motion. Neck supple. No JVD present. No thyromegaly present.  Cardiovascular: Normal rate, regular rhythm and normal heart sounds.  No murmur heard. No BLE edema. Pulmonary/Chest: Effort normal and breath sounds normal. No respiratory distress. Musculoskeletal: Normal range of motion, no joint effusions. No gross deformities Neurological: Pt is  alert and oriented to person, place, and time. No cranial nerve deficit. Coordination, balance, strength, speech and gait are normal. Bilateral hand grips are equal, full AROM of BUE. Skin: LEFT forearm is erythematous, with non-pitting edema and mild tenderness; her left upper arm is mildly erythematous and mildly edematous, minimal tenderness - unchanged and stable from last visit. Psychiatric: Patient has a normal mood and affect. behavior is normal. Judgment and thought content normal.  No results found for this or any previous visit (from the past 72 hour(s)).  Assessment & Plan  1. Cellulitis of left upper arm - Discussed case with Dr. Sanda Klein, we will STOP doxycyline and trial cipro BID x5 days and will refer to oncology for lymphedema evaluation. - ciprofloxacin (CIPRO) 500 MG tablet; Take 1 tablet (500 mg total) by mouth 2 (two) times daily for 5 days.  Dispense: 10 tablet; Refill: 0  2. Left arm swelling - Ambulatory referral to Oncology  3. H/O total mastectomy of left breast - Ambulatory referral to Oncology  -Red flags and when to present for emergency care or RTC including fever >101.4F, chest pain,  shortness of breath, new/worsening/un-resolving symptoms, red streaking up the LUE, malaise/fatigue, worsening edema reviewed with patient at time of visit. Follow up and care instructions discussed and provided in AVS.

## 2018-06-09 MED ORDER — SULFAMETHOXAZOLE-TRIMETHOPRIM 800-160 MG PO TABS: 1.0000 | ORAL_TABLET | Freq: Two times a day (BID) | ORAL | 0 refills | Status: AC

## 2018-06-09 NOTE — Addendum Note (Signed)
Addended by: Hubbard Hartshorn on: 06/09/2018 03:28 PM   Modules accepted: Orders

## 2018-06-09 NOTE — Telephone Encounter (Addendum)
Spoke with Dr. Baruch Gouty, Radiation Oncology.  He recommends trialing keflex as alternative abx and getting Ms. Leitzke in to see her Medical Oncologist ASAP for further evaluation.   Melissa - please elevate referral to oncology to urgent and ensure appointment next week.   Bonnita Nasuti - please contact patient and advise she STOP doxycyline, and start Keflex. Keflex has increased risk of C-diff, and she needs to take a probiotic with the medication to help prevent this.

## 2018-06-09 NOTE — Addendum Note (Signed)
Addended by: Raelyn Ensign E on: 06/09/2018 11:15 AM   Modules accepted: Orders

## 2018-06-09 NOTE — Telephone Encounter (Signed)
Left message for patient to stop Doxycyline and start Keflex. Informed her I will call back on 11/22 to check on her

## 2018-06-10 NOTE — Telephone Encounter (Signed)
Patient notified. Will start new prescription today and has appointment next week with Oncology

## 2018-06-15 ENCOUNTER — Inpatient Hospital Stay (HOSPITAL_BASED_OUTPATIENT_CLINIC_OR_DEPARTMENT_OTHER): Payer: Medicare Other | Admitting: Oncology

## 2018-06-15 ENCOUNTER — Inpatient Hospital Stay: Payer: Medicare Other | Attending: Oncology | Admitting: Occupational Therapy

## 2018-06-15 DIAGNOSIS — C50412 Malignant neoplasm of upper-outer quadrant of left female breast: Secondary | ICD-10-CM

## 2018-06-15 DIAGNOSIS — R21 Rash and other nonspecific skin eruption: Secondary | ICD-10-CM

## 2018-06-15 DIAGNOSIS — Z9221 Personal history of antineoplastic chemotherapy: Secondary | ICD-10-CM | POA: Insufficient documentation

## 2018-06-15 DIAGNOSIS — I890 Lymphedema, not elsewhere classified: Secondary | ICD-10-CM | POA: Insufficient documentation

## 2018-06-15 DIAGNOSIS — L03114 Cellulitis of left upper limb: Secondary | ICD-10-CM | POA: Diagnosis not present

## 2018-06-15 DIAGNOSIS — R609 Edema, unspecified: Secondary | ICD-10-CM | POA: Diagnosis not present

## 2018-06-15 DIAGNOSIS — I972 Postmastectomy lymphedema syndrome: Secondary | ICD-10-CM

## 2018-06-15 DIAGNOSIS — Z171 Estrogen receptor negative status [ER-]: Secondary | ICD-10-CM | POA: Insufficient documentation

## 2018-06-15 NOTE — Progress Notes (Signed)
Symptom Management Consult note [facility]  [phone] [phone]  Patient Care Team: Arnetha Courser, MD as PCP - General (Family Medicine) Theodore Demark, RN as Oncology Nurse Navigator Clayburn Pert, MD as Consulting Physician (General Surgery) Lloyd Huger, MD as Consulting Physician (Oncology) Olean Ree, MD as Consulting Physician (Surgery) Noreene Filbert, MD as Referring Physician (Radiation Oncology)   Name of the patient: Maria Patterson  [id number]  [birth year]   Date of visit: 06/15/2018  Diagnosis: Pathological stage 0 ER/PR negative  Chief Complaint: Cellulitis  Current Treatment: s/p total mastectomy 04/14/17.  Completed neoadjuvant Taxotere, carbo, Herceptin and Perjeta on 02/17/2017.  Completed 1 year of Herceptin on 11/03/2017.  Would not benefit from aromatase inhibitor.   Oncology History: She was last seen by primary oncologist Dr. Grayland Ormond on 02/01/2018 for three-month evaluation.  At that time she continued to complain of mild tenderness to her chest wall at her mastectomy site but otherwise was doing well.  Most recent mammogram was from March 2019 reported as BI-RADS 1.   She was seen on 02/27/2018 for complaints of a rash in the emergency room.  Previously was treated with valacyclovir and hydrocortisone for possible shingles.  Rash affected right face and chest and spread to left face and both of her arms.  Complained of pruritus with weeping fluid.  Improved with steroids and hydrocortisone cream.  Thought to be contact dermatitis d/t fall with possible exposure to poison ivy.  Seen at urgent care on 06/03/2018 for reports of mild swelling of left arm elbow to wrist.  Had ultrasound of left upper arm to rule out DVT.  Ultrasound negative.  She was treated for cellulitis with doxycycline twice daily without improvement.  Instructed to stop doxycycline and start Cipro twice daily x5 days.   Presented to clinic  today for evaluation of left arm lymphedema with Gwenette Greet OT.  Unfortunately, unable to fit for sleeve given edema and tenderness to left extremity.  She has not started taking Cipro as prescribed d/t side effect profile.   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 She completed neoadjuvant Taxotere, carboplatinum, Herceptin, and Perjeta on February 17, 2017. She had a total mastectomy on April 14, 2017, therefore did not require adjuvant XRT. An aromatase inhibitor would not offer benefit given the ER/PR negativity of her disease.  MUGA scan on October 28, 2017 decreased to 53%, but still adequate to proceed with treatment.       Subjective Data:  Subjective:     Maria Patterson is a 77 y.o. female who presents for evaluation of a rash involving the left upper extremity. Rash started 2 weeks ago. Lesions are red, and raised in texture. Rash has changed over time and worsened. Rash is tender to touch and hot.  Associated symptoms: none. Patient denies: abdominal pain, cough, fever, nausea, sore throat and vomiting. Patient has not had contacts with similar rash. Patient has not had new exposures (soaps, lotions, laundry detergents, foods, medications, plants, insects or animals). Previously treated with Doxycycline (06/01/18) without improvement and switched to Cipro (06/03/18).  Never began Cipro.  The following portions of the patient's history were reviewed and updated as appropriate: allergies, current medications, past family history, past medical history, past social history, past surgical history and problem list.  Review of Systems A  comprehensive review of systems was negative except for: Integument/breast: positive for rash, skin color change and heat    Objective:    There were no vitals taken for this visit. General:   alert and no distress  Skin:  erythema noted on left lower and upper arm consitent with cellulitis     Assessment:    Celliulitis to the left extremity.  Previously treated by urgent care on 06/01/2018 with doxycycline without improvement.  Reassessed and switched to Cipro.  Unfortunately, patient refused to take medication d/t side effect profile.  Presented today to clinic for evaluation of left arm lymphedema but unfortunately was unable to be fitted d/t active cellulitis.    Plan:   Stage 0 ER/PR negative left breast cancer: s/p left mastectomy (2018) and 6 cycles neoadjuvant carbo, Perjeta, Herceptin and Taxotere.  Completed in  August 2018.  Completed a year of maintenance Herceptin in April 2019.  Has yearly mammograms.  Last on 09/24/2017 BI-RADS 1 negative.  Repeat in March 2020.  Cellulitis of left arm: Initial diagnosis on 06/01/2018 where she began doxycycline without improvement.  Reevaluated on 06/03/2018 and instructed to switch to Cipro.  Unfortunately, she did not take given side effect profile. RX Bactrim 800-160 mg BID for 7 days.  Patient instructed to take for the full 7 days.   Lymphedema of left extremity: Seen by Luna Fuse, OT for evaluation of lymphedema, swelling and tenderness to left arm.  She was evaluated and found to have an increase in her arm circumference of more than 2 to 4 cm in left compared to right arm.  She was fitted for Isotoner glove and soft Tubigrip for hand to elbow compression.  She was instructed to start wearing 3 days after taking your antibiotic and she will be reevaluated in approximately 1 week.  OT referred to symptom management for further evaluation.  Follow-up: 06/20/18- Called to speak with patient regarding left arm to see if improvement was noted on new antibiotic.  Patient states she has not received her antibiotic from the drug store.  Called to verify that antibiotic was sent over.  Antibiotic is available for her to pick up ASAP.    Greater than 50% was spent in counseling and coordination of care with this patient including but not limited to discussion of the relevant topics above (See A&P) including, but not limited to diagnosis and management of acute and chronic medical conditions.   Faythe Casa, NP 06/20/2018 12:35 PM   CC: Dr. Sanda Klein

## 2018-06-15 NOTE — Therapy (Signed)
[facility] [address] [place], [place], [place], [address] Phone: [phone]   Fax:  [phone]  Occupational Therapy Evaluation  Patient Details  Name: Maria Patterson MRN: [id number] Date of Birth: March 10, 1941 No data recorded  Encounter Date: 06/15/2018    Past Medical History:  Diagnosis Date  . Anemia   . Breast cancer ([place])   . Breast mass, left 09/22/2016   RECOMMENDATION: Ultrasound-guided core biopsies of masses at left breast 2 o'clock 2 cm from nipple, left breast 2 o'clock 5 cm from nipple, abnormal left axillary lymph nodes.  Marland Kitchen GERD (gastroesophageal reflux disease)   . Hyperglycemia   . Hyperlipidemia   . Obesity   . Postmenopausal     Past Surgical History:  Procedure Laterality Date  . ABDOMINAL HYSTERECTOMY    . AXILLARY SENTINEL NODE BIOPSY Left 04/14/2017   Procedure: AXILLARY SENTINEL NODE BIOPSY;  Surgeon: Olean Ree, MD;  Location: [facility] ORS;  Service: General;  Laterality: Left;  . BREAST BIOPSY Left 09/30/2016   US biopsy of 3 areas + chemo  . BREAST EXCISIONAL BIOPSY Left 04/14/2017   Left breast invasive cancer left mastectomy  . ESOPHAGOGASTRODUODENOSCOPY (EGD) WITH PROPOFOL N/A 12/24/2014   Procedure: ESOPHAGOGASTRODUODENOSCOPY (EGD) WITH PROPOFOL;  Surgeon: Manya Silvas, MD;  Location: [facility] ENDOSCOPY;  Service: Endoscopy;  Laterality: N/A;  . MASTECTOMY Left 04/14/2017   Left breast invasive cancer  . PORTACATH PLACEMENT Right 10/27/2016   Procedure: INSERTION PORT-A-CATH;  Surgeon: Nestor Lewandowsky, MD;  Location: [facility] ORS;  Service: General;  Laterality: Right;  . TONSILECTOMY, ADENOIDECTOMY, BILATERAL MYRINGOTOMY AND TUBES    . TONSILLECTOMY    . TOTAL MASTECTOMY Left 04/14/2017   Procedure: TOTAL MASTECTOMY;  Surgeon: Olean Ree, MD;  Location: [facility] ORS;  Service: General;  Laterality: Left;    There were no vitals filed for this visit.  Subjective Assessment - 06/15/18 0932     Subjective   My forearm is still warm and red - after I seen the Dr last time - I went back and finished the previous antibiotic because the new one had a lot of side effect and one of them was in big letters death     Pain Score  2     Pain Location  Arm    Pain Orientation  Left    Pain Descriptors / Indicators  Aching;Tender    Pain Frequency  Intermittent          LYMPHEDEMA/ONCOLOGY QUESTIONNAIRE - 06/15/18 0941      Right Upper Extremity Lymphedema   15 cm Proximal to Olecranon Process  31.5 cm    10 cm Proximal to Olecranon Process  31 cm    Olecranon Process  25.5 cm    15 cm Proximal to Ulnar Styloid Process  25 cm    10 cm Proximal to Ulnar Styloid Process  21.4 cm    Just Proximal to Ulnar Styloid Process  16.5 cm    Across Hand at [employer]  29 cm      Left Upper Extremity Lymphedema   15 cm Proximal to Olecranon Process  32.5 cm    10 cm Proximal to Olecranon Process  34 cm    Olecranon Process  27.5 cm    15 cm Proximal to Ulnar Styloid Process  29 cm    10 cm Proximal to Ulnar Styloid Process  25 cm    Just Proximal to Ulnar Styloid Process  18.8 cm  Across Hand at [employer]  29.5 cm         Per pt her arm infection started about 2 wks prior to seeing her MD - and then pt was seen 2 x by her PCP where they had her on antibiotic for L arm cellulitis - when she followed up on 20th Nov they stopped    doxycyline and switch  trial cipro BID x5 days and  Referred her to lymphedema  Clinic evaluation. - ciprofloxacin (CIPRO) 500 MG tablet; Take 1 tablet (500 mg total) by mouth 2 (two) times daily for 5 days.  Dispense: 10 tablet; Refill: 0 But per pt this date when she came in - she did not switch to Cipro because of side effect - she finished the doxycycline.  But this date arrive with forearm still hot and red - and arm increase circumference of more than 2- 4 cm in L arm compare to R arm  Refer pt to see Faythe Casa NP for symptoms  management   Pt fitted with isotoner glove and soft tubigrip for hand to elbow compression  to start wearing 3 days after starting new antibiotics and this OT will contact her next week. Pt was provided info sheet on lymphedema                           Patient will benefit from skilled therapeutic intervention in order to improve the following deficits and impairments:     Visit Diagnosis: Postmastectomy lymphedema syndrome    Problem List Patient Active Problem List   Diagnosis Date Noted  . H/O total mastectomy of left breast 06/03/2018  . Obesity (BMI 30.0-34.9) 03/08/2018  . Insomnia 09/26/2017  . Chronic renal disease, stage III ([place]) 08/09/2017  . Hypomagnesemia 05/04/2017  . Aortic atherosclerosis ([place]) 05/04/2017  . Compression fracture of first lumbar vertebra ([place]) 05/04/2017  . Degenerative disc disease, lumbar 05/04/2017  . Tachycardia 02/01/2017  . Anemia 02/01/2017  . Atherosclerosis of native arteries of extremity with intermittent claudication ([place]) 11/09/2016  . Allergic rhinitis 11/07/2016  . Malignant neoplasm of upper-outer quadrant of left female breast ([place]) 10/08/2016  . Osteoporosis screening 09/18/2016  . Postmenopausal 09/18/2016  . Ache in joint 03/09/2016  . Medication monitoring encounter 10/30/2015  . GERD without esophagitis 07/01/2012  . Hyperlipidemia LDL goal <100 07/01/2012    Rosalyn Gess OTR/L,CLT 06/15/2018, 9:42 AM  [facility] [address] [place], [place], [place], [address] Phone: [phone]   Fax:  [phone]  Name: Kylinn Shropshire MRN: [id number] Date of Birth: 1941-06-04

## 2018-06-20 MED ORDER — SULFAMETHOXAZOLE-TRIMETHOPRIM 800-160 MG PO TABS: 1.0000 | ORAL_TABLET | Freq: Two times a day (BID) | ORAL | 0 refills | Status: DC

## 2018-06-21 ENCOUNTER — Encounter: Payer: Self-pay | Admitting: Family Medicine

## 2018-06-21 ENCOUNTER — Ambulatory Visit: Payer: Medicare Other | Admitting: Family Medicine

## 2018-06-21 VITALS — BP 112/60 | HR 94 | Temp 98.0°F | Ht 61.0 in | Wt 169.9 lb

## 2018-06-21 DIAGNOSIS — E785 Hyperlipidemia, unspecified: Secondary | ICD-10-CM | POA: Diagnosis not present

## 2018-06-21 DIAGNOSIS — C50412 Malignant neoplasm of upper-outer quadrant of left female breast: Secondary | ICD-10-CM | POA: Diagnosis not present

## 2018-06-21 DIAGNOSIS — Z5181 Encounter for therapeutic drug level monitoring: Secondary | ICD-10-CM

## 2018-06-21 DIAGNOSIS — D649 Anemia, unspecified: Secondary | ICD-10-CM | POA: Diagnosis not present

## 2018-06-21 DIAGNOSIS — N183 Chronic kidney disease, stage 3 unspecified: Secondary | ICD-10-CM

## 2018-06-21 DIAGNOSIS — Z171 Estrogen receptor negative status [ER-]: Secondary | ICD-10-CM

## 2018-06-21 DIAGNOSIS — L03114 Cellulitis of left upper limb: Secondary | ICD-10-CM

## 2018-06-21 DIAGNOSIS — E66811 Obesity, class 1: Secondary | ICD-10-CM

## 2018-06-21 DIAGNOSIS — E669 Obesity, unspecified: Secondary | ICD-10-CM

## 2018-06-21 MED ORDER — ATORVASTATIN CALCIUM 20 MG PO TABS: ORAL_TABLET | ORAL | 1 refills | Status: DC

## 2018-06-21 MED ORDER — EZETIMIBE 10 MG PO TABS: 10.0000 mg | ORAL_TABLET | Freq: Every day | ORAL | 11 refills | Status: DC

## 2018-06-21 MED ORDER — FLUTICASONE PROPIONATE 50 MCG/ACT NA SUSP: 1.0000 | Freq: Every day | NASAL | 11 refills | Status: DC | PRN

## 2018-06-21 NOTE — Assessment & Plan Note (Signed)
Check liver and kidneys 

## 2018-06-21 NOTE — Assessment & Plan Note (Signed)
Check Cr today; avoid NSAIDs, stay hydrated

## 2018-06-21 NOTE — Assessment & Plan Note (Signed)
Try to lose five pounds over the next 3 months; limit portions, sweets, bread, stay hydrated; see AVS

## 2018-06-21 NOTE — Progress Notes (Signed)
Greetings. It was a pleasure to see you today. Your complete blood count is stable. You have normal numbers of white blood cells and platelets. Your anemia is stable. The other labs are pending. Peace, Dr. Sanda Klein

## 2018-06-21 NOTE — Progress Notes (Signed)
BP 112/60   Pulse 94   Temp 98 F (36.7 C) (Oral)   Ht 5\' 1"  (1.549 m)   Wt 169 lb 14.4 oz (77.1 kg)   SpO2 94%   BMI 32.10 kg/m    Subjective:    Patient ID: Maria Patterson, female    DOB: [birth year], 77 y.o.   MRN: [id number]  HPI: Maria Patterson is a 77 y.o. female  Chief Complaint  Patient presents with  . Follow-up  . Hyperlipidemia    wants to see about getting on something (was on powder and did not like)    HPI Patient is here for f/u She has high cholesterol; she did not like the powder; she wants to go back on old medicine, atorvastatin but she was having pain I referred her to endocrinologist for her cholesterol, but she says it is too expensive Atherosclerosis of aorta; patient is aware  She was seen by heme-onc and was started on antibiotics; no problems with the antibiotics, just took one pill so far; going on for 5 weeks total; hx of left breast cancer and axillary lymph node dissection; she knows to not let anyone do BP checks in that left arm  She has allergies; likes the nasal spray; wants refill; does not have to use it every day  CKD; Cr was 1.16 in June; uses tylenol; she is a good water drinker, "Chiquita Loth, yes"  Mild anemia noted in the hospital labs back in June, chronic issue; no real fatigue  Depression screen [facility] 2/9 06/21/2018 06/08/2018 03/08/2018 02/28/2018 02/25/2018  Decreased Interest 0 0 0 0 0  Down, Depressed, Hopeless 0 0 0 0 0  PHQ - 2 Score 0 0 0 0 0  Altered sleeping 0 0 3 0 -  Tired, decreased energy 0 0 0 0 -  Change in appetite 0 0 0 0 -  Feeling bad or failure about yourself  0 0 0 0 -  Trouble concentrating 0 0 0 0 -  Moving slowly or fidgety/restless 0 0 0 0 -  Suicidal thoughts 0 0 0 0 -  PHQ-9 Score 0 0 3 0 -  Difficult doing work/chores Not difficult at all Not difficult at all Not difficult at all Not difficult at all -   Fall Risk  06/21/2018 06/08/2018 06/03/2018 03/08/2018 02/28/2018  Falls in the past year? 1 0 0 No Yes    Comment - - - - -  Number falls in past yr: 0 - - - 1  Injury with Fall? 0 - - - No  Risk for fall due to : - - - - -  Risk for fall due to: Comment - - - - -  Follow up - - - - Falls evaluation completed    Relevant past medical, surgical, family and social history reviewed Past Medical History:  Diagnosis Date  . Anemia   . Breast cancer ([place])   . Breast mass, left 09/22/2016   RECOMMENDATION: Ultrasound-guided core biopsies of masses at left breast 2 o'clock 2 cm from nipple, left breast 2 o'clock 5 cm from nipple, abnormal left axillary lymph nodes.  Marland Kitchen GERD (gastroesophageal reflux disease)   . Hyperglycemia   . Hyperlipidemia   . Obesity   . Postmenopausal    Past Surgical History:  Procedure Laterality Date  . ABDOMINAL HYSTERECTOMY    . AXILLARY SENTINEL NODE BIOPSY Left 04/14/2017   Procedure: AXILLARY SENTINEL NODE BIOPSY;  Surgeon: Olean Ree, MD;  Location: [facility] ORS;  Service: General;  Laterality: Left;  . BREAST BIOPSY Left 09/30/2016   US biopsy of 3 areas + chemo  . BREAST EXCISIONAL BIOPSY Left 04/14/2017   Left breast invasive cancer left mastectomy  . ESOPHAGOGASTRODUODENOSCOPY (EGD) WITH PROPOFOL N/A 12/24/2014   Procedure: ESOPHAGOGASTRODUODENOSCOPY (EGD) WITH PROPOFOL;  Surgeon: Manya Silvas, MD;  Location: [facility] END[facility]COPY;  Service: Endoscopy;  Laterality: N/A;  . MASTECTOMY Left 04/14/2017   Left breast invasive cancer  . PORTACATH PLACEMENT Right 10/27/2016   Procedure: INSERTION PORT-A-CATH;  Surgeon: Nestor Lewandowsky, MD;  Location: [facility] ORS;  Service: General;  Laterality: Right;  . TONSILECTOMY, ADENOIDECTOMY, BILATERAL MYRINGOTOMY AND TUBES    . TONSILLECTOMY    . TOTAL MASTECTOMY Left 04/14/2017   Procedure: TOTAL MASTECTOMY;  Surgeon: Olean Ree, MD;  Location: [facility] ORS;  Service: General;  Laterality: Left;   Family History  Problem Relation Age of Onset  . Stroke Mother   . Stroke Father   . Cancer Neg Hx   . Depression Neg Hx    Social  History   Tobacco Use  . Smoking status: Never Smoker  . Smokeless tobacco: Never Used  . Tobacco comment: smoking cessation materials not required  Substance Use Topics  . Alcohol use: No    Alcohol/week: 0.0 standard drinks  . Drug use: No     Office Visit from 06/21/2018 in [facility]  AUDIT-C Score  0      Interim medical history since last visit reviewed. Allergies and medications reviewed  Review of Systems  Constitutional: Negative for fever and unexpected weight change.  Musculoskeletal: Positive for arthralgias (knee pain, taking tylenol).   Per HPI unless specifically indicated above     Objective:    BP 112/60   Pulse 94   Temp 98 F (36.7 C) (Oral)   Ht 5\' 1"  (1.549 m)   Wt 169 lb 14.4 oz (77.1 kg)   SpO2 94%   BMI 32.10 kg/m   Wt Readings from Last 3 Encounters:  06/21/18 169 lb 14.4 oz (77.1 kg)  06/08/18 168 lb (76.2 kg)  06/03/18 169 lb 4.8 oz (76.8 kg)    Physical Exam  Constitutional: She appears well-developed and well-nourished. No distress.  HENT:  Head: Normocephalic and atraumatic.  Eyes: EOM are normal. No scleral icterus.  Neck: No thyromegaly present.  Cardiovascular: Normal rate, regular rhythm and normal heart sounds.  No murmur heard. Pulmonary/Chest: Effort normal and breath sounds normal. No respiratory distress. She has no wheezes.  Abdominal: Soft. Bowel sounds are normal. She exhibits no distension.  Musculoskeletal: She exhibits no edema.       Arms: Mild erythema and edema of the LEFT forearm  Neurological: She is alert.  Skin: Skin is warm and dry. She is not diaphoretic. No pallor.  Mild warm and erythema of the LEFT forearm  Psychiatric: She has a normal mood and affect. Her behavior is normal. Judgment and thought content normal.    Results for orders placed or performed in visit on 03/08/18  Lipid panel  Result Value Ref Range   Cholesterol 371 (H) <200 mg/dL   HDL 59 >50 mg/dL    Triglycerides 109 <150 mg/dL   LDL Cholesterol (Calc) 288 (H) mg/dL (calc)   Total CHOL/HDL Ratio 6.3 (H) <5.0 (calc)   Non-HDL Cholesterol (Calc) 312 (H) <130 mg/dL (calc)      Assessment & Plan:   Problem List Items Addressed This Visit      Genitourinary  Chronic renal disease, stage III ([facility])    Check Cr today; avoid NSAIDs, stay hydrated        Other   Obesity (BMI 30.0-34.9)    Try to lose five pounds over the next 3 months; limit portions, sweets, bread, stay hydrated; see AVS      Medication monitoring encounter    Check liver and kidneys      Relevant Orders   COMPLETE METABOLIC PANEL WITH GFR   Malignant neoplasm of upper-outer quadrant of left female breast ([place])    Under the care of oncologist      Hyperlipidemia LDL goal <100    Try to limit saturated fats; start back on lower dose statin just 3 days a week plus daily zetia; recheck cholesterol in 6-8 weeks      Relevant Medications   atorvastatin (LIPITOR) 20 MG tablet   ezetimibe (ZETIA) 10 MG tablet   Other Relevant Orders   Lipid panel   Anemia    Check CBC and other labs      Relevant Orders   CBC with Differential/Platelet   Vitamin B12   Iron, TIBC and Ferritin Panel    Other Visit Diagnoses    Cellulitis of left upper extremity    -  Primary   patient is taking TMP/SMX DS and will complete course; caution about C diff, see AVS       Follow up plan: Return in about 4 months (around 11/03/2018) for follow-up visit with Dr. Sanda Klein.  An after-visit summary was printed and given to the patient at [facility].  Please see the patient instructions which may contain other information and recommendations beyond what is mentioned above in the assessment and plan.  Meds ordered this encounter  Medications  . atorvastatin (LIPITOR) 20 MG tablet    Sig: One by mouth at bedtime three nights a week (Mon, Wed, Fri)    Dispense:  39 tablet    Refill:  1    39 pills is 13 week supply  . fluticasone  (FLONASE) 50 MCG/ACT nasal spray    Sig: Place 1 spray into both nostrils daily as needed for allergies or rhinitis.    Dispense:  16 g    Refill:  11  . ezetimibe (ZETIA) 10 MG tablet    Sig: Take 1 tablet (10 mg total) by mouth daily.    Dispense:  30 tablet    Refill:  11    Orders Placed This Encounter  Procedures  . CBC with Differential/Platelet  . COMPLETE METABOLIC PANEL WITH GFR  . Vitamin B12  . Iron, TIBC and Ferritin Panel  . Lipid panel

## 2018-06-21 NOTE — Assessment & Plan Note (Signed)
Check CBC and other labs

## 2018-06-21 NOTE — Assessment & Plan Note (Addendum)
Try to limit saturated fats; start back on lower dose statin just 3 days a week plus daily zetia; recheck cholesterol in 6-8 weeks

## 2018-06-21 NOTE — Assessment & Plan Note (Signed)
Under the care of oncologist 

## 2018-06-21 NOTE — Patient Instructions (Addendum)
Please do eat yogurt or kimchi or take a probiotic daily for the next month We want to replace the healthy germs in the gut If you notice foul, watery diarrhea in the next two months, schedule an appointment RIGHT AWAY or go to an urgent care or the emergency room if a holiday or over a weekend  Try to limit saturated fats in your diet (bologna, hot dogs, barbeque, cheeseburgers, hamburgers, steak, bacon, sausage, cheese, etc.) and get more fresh fruits, vegetables, and whole grains  Start the new cholesterol medicines zetia (ezemitibe) will be every single day Atorvastatin will be just three nights a week Return in 6-8 weeks for cholesterol check (around August 09, 2018)  Check out the information at familydoctor.org entitled "Nutrition for Weight Loss: What You Need to Know about Fad Diets" Try to lose between 1-2 pounds per week by taking in fewer calories and burning off more calories You can succeed by limiting portions, limiting foods dense in calories and fat, becoming more active, and drinking 8 glasses of water a day (64 ounces) Don't skip meals, especially breakfast, as skipping meals may alter your metabolism Do not use over-the-counter weight loss pills or gimmicks that claim rapid weight loss A healthy BMI (or body mass index) is between 18.5 and 24.9 You can calculate your ideal BMI at the [place] website ClubMonetize.fr   Obesity, Adult Obesity is the condition of having too much total body fat. Being overweight or obese means that your weight is greater than what is considered healthy for your body size. Obesity is determined by a measurement called BMI. BMI is an estimate of body fat and is calculated from height and weight. For adults, a BMI of 30 or higher is considered obese. Obesity can eventually lead to other health concerns and major illnesses, including:  Stroke.  Coronary artery disease (CAD).  Type 2  diabetes.  Some types of cancer, including cancers of the colon, breast, uterus, and gallbladder.  Osteoarthritis.  High blood pressure (hypertension).  High cholesterol.  Sleep apnea.  Gallbladder stones.  Infertility problems.  What are the causes? The main cause of obesity is taking in (consuming) more calories than your body uses for energy. Other factors that contribute to this condition may include:  Being born with genes that make you more likely to become obese.  Having a medical condition that causes obesity. These conditions include: ? Hypothyroidism. ? Polycystic ovarian syndrome (PCOS). ? Binge-eating disorder. ? Cushing syndrome.  Taking certain medicines, such as steroids, antidepressants, and seizure medicines.  Not being physically active (sedentary lifestyle).  Living where there are limited places to exercise safely or buy healthy foods.  Not getting enough sleep.  What increases the risk? The following factors may increase your risk of this condition:  Having a family history of obesity.  Being a woman of African-American descent.  Being a man of Hispanic descent.  What are the signs or symptoms? Having excessive body fat is the main symptom of this condition. How is this diagnosed? This condition may be diagnosed based on:  Your symptoms.  Your medical history.  A physical exam. Your health care provider may measure: ? Your BMI. If you are an adult with a BMI between 25 and less than 30, you are considered overweight. If you are an adult with a BMI of 30 or higher, you are considered obese. ? The distances around your hips and your waist (circumferences). These may be compared to each other to help diagnose  your condition. ? Your skinfold thickness. Your health care provider may gently pinch a fold of your skin and measure it.  How is this treated? Treatment for this condition often includes changing your lifestyle. Treatment may include  some or all of the following:  Dietary changes. Work with your health care provider and a dietitian to set a weight-loss goal that is healthy and reasonable for you. Dietary changes may include eating: ? Smaller portions. A portion size is the amount of a particular food that is healthy for you to eat at one time. This varies from person to person. ? Low-calorie or low-fat options. ? More whole grains, fruits, and vegetables.  Regular physical activity. This may include aerobic activity (cardio) and strength training.  Medicine to help you lose weight. Your health care provider may prescribe medicine if you are unable to lose 1 pound a week after 6 weeks of eating more healthily and doing more physical activity.  Surgery. Surgical options may include gastric banding and gastric bypass. Surgery may be done if: ? Other treatments have not helped to improve your condition. ? You have a BMI of 40 or higher. ? You have life-threatening health problems related to obesity.  Follow these instructions at home:  Eating and drinking   Follow recommendations from your health care provider about what you eat and drink. Your health care provider may advise you to: ? Limit fast foods, sweets, and processed snack foods. ? Choose low-fat options, such as low-fat milk instead of whole milk. ? Eat 5 or more servings of fruits or vegetables every day. ? Eat at home more often. This gives you more control over what you eat. ? Choose healthy foods when you eat out. ? Learn what a healthy portion size is. ? Keep low-fat snacks on hand. ? Avoid sugary drinks, such as soda, fruit juice, iced tea sweetened with sugar, and flavored milk. ? Eat a healthy breakfast.  Drink enough water to keep your urine clear or pale yellow.  Do not go without eating for long periods of time (do not fast) or follow a fad diet. Fasting and fad diets can be unhealthy and even dangerous. Physical Activity  Exercise regularly,  as told by your health care provider. Ask your health care provider what types of exercise are safe for you and how often you should exercise.  Warm up and stretch before being active.  Cool down and stretch after being active.  Rest between periods of activity. Lifestyle  Limit the time that you spend in front of your TV, computer, or video game system.  Find ways to reward yourself that do not involve food.  Limit alcohol intake to no more than 1 drink a day for nonpregnant women and 2 drinks a day for men. One drink equals 12 oz of beer, 5 oz of wine, or 1 oz of hard liquor. General instructions  Keep a weight loss journal to keep track of the food you eat and how much you exercise you get.  Take over-the-counter and prescription medicines only as told by your health care provider.  Take vitamins and supplements only as told by your health care provider.  Consider joining a support group. Your health care provider may be able to recommend a support group.  Keep all follow-up visits as told by your health care provider. This is important. Contact a health care provider if:  You are unable to meet your weight loss goal after 6 weeks of dietary  and lifestyle changes. This information is not intended to replace advice given to you by your health care provider. Make sure you discuss any questions you have with your health care provider. Document Released: 08/13/2004 Document Revised: 12/09/2015 Document Reviewed: 04/24/2015 Elsevier Interactive Patient Education  2018 [place].  Preventing Unhealthy [employer], Adult Staying at a healthy weight is important. When fat builds up in your body, you may become overweight or obese. These conditions put you at greater risk for developing certain health problems, such as heart disease, diabetes, sleeping problems, joint problems, and some cancers. Unhealthy weight gain is often the result of making unhealthy choices in what you eat. It  is also a result of not getting enough exercise. You can make changes to your lifestyle to prevent obesity and stay as healthy as possible. What nutrition changes can be made? To maintain a healthy weight and prevent obesity:  Eat only as much as your body needs. To do this: ? Pay attention to signs that you are hungry or full. Stop eating as soon as you feel full. ? If you feel hungry, try drinking water first. Drink enough water so your urine is clear or pale yellow. ? Eat smaller portions. ? Look at serving sizes on food labels. Most foods contain more than one serving per container. ? Eat the recommended amount of calories for your gender and activity level. While most active people should eat around 2,000 calories per day, if you are trying to lose weight or are not very active, you main need to eat less calories. Talk to your health care provider or dietitian about how many calories you should eat each day.  Choose healthy foods, such as: ? Fruits and vegetables. Try to fill at least half of your plate at each meal with fruits and vegetables. ? Whole grains, such as whole wheat bread, brown rice, and quinoa. ? Lean meats, such as chicken or fish. ? Other healthy proteins, such as beans, eggs, or tofu. ? Healthy fats, such as nuts, seeds, fatty fish, and olive oil. ? Low-fat or fat-free dairy.  Check food labels and avoid food and drinks that: ? Are high in calories. ? Have added sugar. ? Are high in sodium. ? Have saturated fats or trans fats.  Limit how much you eat of the following foods: ? Prepackaged meals. ? Fast food. ? Fried foods. ? Processed meat, such as bacon, sausage, and deli meats. ? Fatty cuts of red meat and poultry with skin.  Cook foods in healthier ways, such as by baking, broiling, or grilling.  When grocery shopping, try to shop around the outside of the store. This helps you buy mostly fresh foods and avoid canned and prepackaged foods.  What lifestyle  changes can be made?  Exercise at least 30 minutes 5 or more days each week. Exercising includes brisk walking, yard work, biking, running, swimming, and team sports like basketball and soccer. Ask your health care provider which exercises are safe for you.  Do not use any products that contain nicotine or tobacco, such as cigarettes and e-cigarettes. If you need help quitting, ask your health care provider.  Limit alcohol intake to no more than 1 drink a day for nonpregnant women and 2 drinks a day for men. One drink equals 12 oz of beer, 5 oz of wine, or 1 oz of hard liquor.  Try to get 7-9 hours of sleep each night. What other changes can be made?  Keep a  food and activity journal to keep track of: ? What you ate and how many calories you had. Remember to count sauces, dressings, and side dishes. ? Whether you were active, and what exercises you did. ? Your calorie, weight, and activity goals.  Check your weight regularly. Track any changes. If you notice you have gained weight, make changes to your diet or activity routine.  Avoid taking weight-loss medicines or supplements. Talk to your health care provider before starting any new medicine or supplement.  Talk to your health care provider before trying any new diet or exercise plan. Why are these changes important? Eating healthy, staying active, and having healthy habits not only help prevent obesity, they also:  Help you to manage stress and emotions.  Help you to connect with friends and family.  Improve your self-esteem.  Improve your sleep.  Prevent long-term health problems.  What can happen if changes are not made? Being obese or overweight can cause you to develop joint or bone problems, which can make it hard for you to stay active or do activities you enjoy. Being obese or overweight also puts stress on your heart and lungs and can lead to health problems like diabetes, heart disease, and some cancers. Where to  find more information: Talk with your health care provider or a dietitian about healthy eating and healthy lifestyle choices. You may also find other information through these resources:  U.S. Department of Agriculture MyPlate: FormerBoss.no  American Heart Association: www.heart.org  Centers for Disease Control and Prevention: http://www.wolf.info/  Summary  Staying at a healthy weight is important. It helps prevent certain diseases and health problems, such as heart disease, diabetes, joint problems, sleep disorders, and some cancers.  Being obese or overweight can cause you to develop joint or bone problems, which can make it hard for you to stay active or do activities you enjoy.  You can prevent unhealthy weight gain by eating a healthy diet, exercising regularly, not smoking, limiting alcohol, and getting enough sleep.  Talk with your health care provider or a dietitian for guidance about healthy eating and healthy lifestyle choices. This information is not intended to replace advice given to you by your health care provider. Make sure you discuss any questions you have with your health care provider. Document Released: 07/07/2016 Document Revised: 08/12/2016 Document Reviewed: 08/12/2016 Elsevier Interactive Patient Education  [employer].

## 2018-06-22 ENCOUNTER — Other Ambulatory Visit: Payer: Self-pay | Admitting: Family Medicine

## 2018-06-22 DIAGNOSIS — N183 Chronic kidney disease, stage 3 unspecified: Secondary | ICD-10-CM

## 2018-06-22 LAB — CBC WITH DIFFERENTIAL/PLATELET
BASOS ABS: 50 {cells}/uL (ref 0–200)
Basophils Relative: 1.2 %
EOS ABS: 118 {cells}/uL (ref 15–500)
EOS PCT: 2.8 %
HEMATOCRIT: 33.8 % — AB (ref 35.0–45.0)
Hemoglobin: 11.2 g/dL — ABNORMAL LOW (ref 11.7–15.5)
LYMPHS ABS: 1592 {cells}/uL (ref 850–3900)
MCH: 30.5 pg (ref 27.0–33.0)
MCHC: 33.1 g/dL (ref 32.0–36.0)
MCV: 92.1 fL (ref 80.0–100.0)
MPV: 10.6 fL (ref 7.5–12.5)
Monocytes Relative: 7.8 %
Neutro Abs: 2113 cells/uL (ref 1500–7800)
Neutrophils Relative %: 50.3 %
Platelets: 180 10*3/uL (ref 140–400)
RBC: 3.67 10*6/uL — ABNORMAL LOW (ref 3.80–5.10)
RDW: 13.6 % (ref 11.0–15.0)
Total Lymphocyte: 37.9 %
WBC mixed population: 328 cells/uL (ref 200–950)
WBC: 4.2 10*3/uL (ref 3.8–10.8)

## 2018-06-22 LAB — COMPLETE METABOLIC PANEL WITH GFR
AG RATIO: 1.6 (calc) (ref 1.0–2.5)
ALT: 13 U/L (ref 6–29)
AST: 19 U/L (ref 10–35)
Albumin: 3.8 g/dL (ref 3.6–5.1)
Alkaline phosphatase (APISO): 92 U/L (ref 33–130)
BUN/Creatinine Ratio: 17 (calc) (ref 6–22)
BUN: 23 mg/dL (ref 7–25)
CALCIUM: 9.4 mg/dL (ref 8.6–10.4)
CO2: 21 mmol/L (ref 20–32)
Chloride: 113 mmol/L — ABNORMAL HIGH (ref 98–110)
Creat: 1.38 mg/dL — ABNORMAL HIGH (ref 0.60–0.93)
GFR, EST AFRICAN AMERICAN: 42 mL/min/{1.73_m2} — AB (ref >=60)
GFR, EST NON AFRICAN AMERICAN: 37 mL/min/{1.73_m2} — AB (ref >=60)
GLOBULIN: 2.4 g/dL (ref 1.9–3.7)
Glucose, Bld: 74 mg/dL (ref 65–99)
POTASSIUM: 4 mmol/L (ref 3.5–5.3)
Sodium: 142 mmol/L (ref 135–146)
Total Bilirubin: 0.4 mg/dL (ref 0.2–1.2)
Total Protein: 6.2 g/dL (ref 6.1–8.1)

## 2018-06-22 LAB — IRON,TIBC AND FERRITIN PANEL
%SAT: 31 % (calc) (ref 16–45)
FERRITIN: 186 ng/mL (ref 16–288)
Iron: 76 ug/dL (ref 45–160)
TIBC: 249 ug/dL — AB (ref 250–450)

## 2018-06-22 LAB — VITAMIN B12: Vitamin B-12: 398 pg/mL (ref 200–1100)

## 2018-06-22 NOTE — Progress Notes (Signed)
Labs for 2 weeks

## 2018-06-28 ENCOUNTER — Ambulatory Visit: Payer: Self-pay

## 2018-06-28 NOTE — Telephone Encounter (Signed)
Patient called in with c/o "rectal bleeding." She says "I had a bowel movement last night, wiped and saw a little blood on the tissue. Not much blood, it was red just like when you start your period and wipe, that's how it was. I didn't look in the toilet at the stool to see if any was mixed in it. This only happened the one time." I asked about diarrhea, constipation, she denies. I asked about other symptoms, she denies. According to protocol, see PCP within 2 weeks, patient prefers morning appointment, no availability with PCP, appointment scheduled for tomorrow at [facility] with Raelyn Ensign, [place], care advice given, patient verbalized understanding.  Reason for Disposition . NO physician examination for rectal bleeding in past year  Answer Assessment - Initial Assessment Questions 1. APPEARANCE of BLOOD: "What color is it?" "Is it passed separately, on the surface of the stool, or mixed in with the stool?"      Red blood on the tissue 2. AMOUNT: "How much blood was passed?"      Not much 3. FREQUENCY: "How many times has blood been passed with the stools?"      Just last night; I didn't look in the toilet or at the stool 4. ONSET: "When was the blood first seen in the stools?" (Days or weeks)      Last night 5. DIARRHEA: "Is there also some diarrhea?" If so, ask: "How many diarrhea stools were passed in past 24 hours?"      No 6. CONSTIPATION: "Do you have constipation?" If so, "How bad is it?"     Not really 7. RECURRENT SYMPTOMS: "Have you had blood in your stools before?" If so, ask: "When was the last time?" and "What happened that time?"      No 8. BLOOD THINNERS: "Do you take any blood thinners?" (e.g., Coumadin/warfarin, Pradaxa/dabigatran, aspirin)     No 9. OTHER SYMPTOMS: "Do you have any other symptoms?"  (e.g., abdominal pain, vomiting, dizziness, fever)     No 10. PREGNANCY: "Is there any chance you are pregnant?" "When was your last menstrual period?"       No  Protocols used:  RECTAL BLEEDING-A-AH

## 2018-06-29 ENCOUNTER — Telehealth: Payer: Self-pay | Admitting: *Deleted

## 2018-06-29 ENCOUNTER — Encounter: Payer: Self-pay | Admitting: Family Medicine

## 2018-06-29 ENCOUNTER — Inpatient Hospital Stay: Payer: Medicare Other | Attending: Oncology | Admitting: Oncology

## 2018-06-29 ENCOUNTER — Ambulatory Visit (INDEPENDENT_AMBULATORY_CARE_PROVIDER_SITE_OTHER): Payer: Medicare Other | Admitting: Family Medicine

## 2018-06-29 ENCOUNTER — Encounter: Payer: Self-pay | Admitting: Oncology

## 2018-06-29 ENCOUNTER — Inpatient Hospital Stay: Payer: Medicare Other | Admitting: Occupational Therapy

## 2018-06-29 VITALS — BP 120/68 | HR 83 | Temp 98.0°F | Resp 14 | Ht 61.0 in | Wt 170.4 lb

## 2018-06-29 VITALS — BP 180/50 | HR 83 | Temp 96.9°F | Resp 16

## 2018-06-29 DIAGNOSIS — C50412 Malignant neoplasm of upper-outer quadrant of left female breast: Secondary | ICD-10-CM | POA: Diagnosis not present

## 2018-06-29 DIAGNOSIS — L03114 Cellulitis of left upper limb: Secondary | ICD-10-CM

## 2018-06-29 DIAGNOSIS — I890 Lymphedema, not elsewhere classified: Secondary | ICD-10-CM

## 2018-06-29 DIAGNOSIS — R21 Rash and other nonspecific skin eruption: Secondary | ICD-10-CM | POA: Diagnosis not present

## 2018-06-29 DIAGNOSIS — Z86010 Personal history of colonic polyps: Secondary | ICD-10-CM

## 2018-06-29 DIAGNOSIS — K219 Gastro-esophageal reflux disease without esophagitis: Secondary | ICD-10-CM | POA: Diagnosis not present

## 2018-06-29 DIAGNOSIS — K625 Hemorrhage of anus and rectum: Secondary | ICD-10-CM | POA: Diagnosis not present

## 2018-06-29 DIAGNOSIS — Z171 Estrogen receptor negative status [ER-]: Secondary | ICD-10-CM

## 2018-06-29 LAB — HEMOCCULT GUIAC POC 1CARD (OFFICE): Fecal Occult Blood, POC: NEGATIVE

## 2018-06-29 NOTE — Progress Notes (Signed)
Symptom Management Consult note [facility]  [phone] [phone]  Patient Care Team: Arnetha Courser, MD as PCP - General (Family Medicine) Theodore Demark, RN as Oncology Nurse Navigator Clayburn Pert, MD as Consulting Physician (General Surgery) Lloyd Huger, MD as Consulting Physician (Oncology) Olean Ree, MD as Consulting Physician (Surgery) Noreene Filbert, MD as Referring Physician (Radiation Oncology)   Name of the patient: Maria Patterson  [id number]  12-31-1940   Date of visit: 06/29/2018  Diagnosis: Breast Cancer   Chief Complaint: Cellulitis  Current Treatment: s/p total mastectomy 04/14/2017.  Completed neoadjuvant Taxotere, carbo, Herceptin and Perjeta on 02/17/2017.  Completed 1 year of Herceptin on 11/03/2017.  Would not benefit from aromatase inhibitor.  Oncology History: Patient was last seen by primary oncologist Dr. Grayland Ormond on 02/01/2018 for three-month evaluation.  At that time she continued to complain of mild tenderness over chest wall at her mastectomy site but otherwise is doing well.  Most recent mammogram was from March 2019 reported as BI-RADS 1.  She was seen and treated in the ER on 02/27/2018 for complaints of a rash.  Initially treated with valacyclovir and hydrocortisone for shingles.  Final diagnosis was contact dermatitis d/t poison ivy exposure.  Noted improvement with steroids and hydrocortisone.  Evaluated at urgent care on 06/03/2018 for reports of swelling of left arm elbow to wrist.  Ruled out left upper arm DVT.  Treated for cellulitis with doxycycline without improvement.  Switched to Cipro but unfortunately did not start due to side effects.  She was evaluated in [facility] on 06/15/2018 for cellulitis that was not improving.  Patient unfortunately never started Cipro as requested.  Was switched to Bactrim for 7 days.  She was seen by Luna Fuse, OT for evaluation of lymphedema.  Fitted for  Isotoner glove and soft Tubigrip for hand to elbow compression.  Instructed to start wearing after initiating antibiotics with improvement.   Evaluated by Raelyn Ensign, FNP yesterday for rectal bleeding and to follow-up on left arm swelling.  No improvement noted per note.  Asked if we could reevaluate.  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 She completed neoadjuvant Taxotere, carboplatinum, Herceptin, and Perjeta on February 17, 2017. She had a total mastectomy on April 14, 2017, therefore did not require adjuvant XRT. An aromatase inhibitor would not offer benefit given the ER/PR negativity of her disease.  MUGA scan on October 28, 2017 decreased to 53%, but still adequate to proceed with treatment.       Subjective Data:   ECOG: 1 - Symptomatic but completely ambulatory _0  Subjective:     Maria Patterson is a 77 y.o. female who presents for evaluation of a rash involving the left forearm. Rash started 4 weeks ago. Lesions are red, and flat in texture. Rash has changed over time. Rash hot to touch. Associated symptoms: none. Patient denies: abdominal pain, congestion, fever, myalgia, nausea and sore throat. Patient has not had contacts with similar rash. Patient has not had new exposures (soaps, lotions, laundry detergents, foods, medications, plants, insects or animals).  The following portions of the patient's history were reviewed and updated as appropriate: allergies, current medications, past family history, past medical history, past social history, past surgical history and problem list.  Review of Systems A comprehensive review of systems was negative  except for: Integument/breast: positive for rash and erythema noted to left forearm with induration. Streaking up anterior forearm. Non-painful. Warm to couh. Swelling  preset but improved from last assessment    Objective:    BP (!) 180/50 (BP Location: Right Arm, Patient Position: Sitting)   Pulse 83   Temp (!) 96.9 F (36.1 C) (Tympanic)   Resp 16  General:  alert and no distress  Skin:  erythema noted on left forearm             Assessment:    Question cellulitis verse lymphedema.  Has been on 3 separate antibiotics for treatment of the left forearm cellulitis.  Continues 2 more days of Bactrim.  Afebrile.  Well-appearing.   Plan:    Stage 0 ER/PR negative left breast cancer: s/p left mastectomy (2018) and 6 cycles neoadjuvant carbo, Perjeta, Herceptin and Taxotere.  Completed a year of maintenance Herceptin in April 2019.  Last mammogram on 09/24/2017 BI-RADS 1.  Has repeat in March 2020.  Cellulitis/lymphedema of left arm: To date, patient has been treated with 3 different antibiotics (doxycycline, Cipro and Bactrim).  Unfortunately, she has not been compliant.  Unclear the amount of each antibiotic she has actually taken. Patient is a poor historian. Cellulitis appears to be improving if not completely resolved.  Appears to be significantly less swollen per measurements performed by Occupational Therapy today in clinic.  Given she is afebrile and feels relatively well, will allow her to finish out antibiotics she is currently on and treat lymphedema with new soft Tubigrip and Isotoner glove on left hand and forearm to elbow.  She will have a reevaluation with occupational therapy on Tuesday, December 17 to see if lymphedema/cellulitis has improved.  Maureen Dupree, OT thinks that may be fibrotic changes noted on patient's distal forearm will resolve with appropriate lymphedema sleeve and glove.  She was provided with appropriate lymphedema sleeve and instructed to wear 24/7.  Instructed to continue course of antibiotics.  Red flag symptoms and when to report to the emergency room include fever unrelieved with Tylenol, worsening swelling of left  arm and or pain.   Greater than 50% was spent in counseling and coordination of care with this patient including but not limited to discussion of the relevant topics above (See A&P) including, but not limited to diagnosis and management of acute and chronic medical conditions.   Jennifer Burns, NP 06/30/2018 10:24 AM  

## 2018-06-29 NOTE — Progress Notes (Signed)
Name: Maria Patterson   MRN: [id number]    DOB: 08/31/1940   Date:06/29/2018       Progress Note  Subjective  Chief Complaint  Chief Complaint  Patient presents with  . Rectal Bleeding    seen when wiping  . Follow-up    left arm swelling    HPI LEFT arm swelling/erythema/cellulitis: She saw Heme/Onc for the same on 06/15/2018, then again on 06/21/2018 by PCP Dr. Sanda Klein.  She has a few days left of her antibiotics but has not noticed much improvement. She has been sleeping in her lymphadema sleeve, but has not noticed any improvement. Per notes from [facility] NP, she was supposed to have OT follow up in 1 week, but this did not happen due to her phone not working.  I have sent a message via Epic to both the NP and OT in the hopes that they will be able to set something up for re-evaluation.  Rectal Bleeding: Yesterday morning she noticed small amount of blood on her toilet paper after having a BM. She states she not been constipated recently, but does have constipation on occasion - she has been eating a lot of fruit and fiber to help prevent this. She denies history of hemorrhoids or rectal bleeding.  She does note a history of polyps on colonoscopy and would like to be referred back to GI for possible colonoscopy. No abdominal pain, diarrhea.  States she always has some nausea - states this goes along with her GERD at baseline.  No hematuria, gum bleeding, epistaxis, not on blood thinners.  Patient Active Problem List   Diagnosis Date Noted  . H/O total mastectomy of left breast 06/03/2018  . Obesity (BMI 30.0-34.9) 03/08/2018  . Insomnia 09/26/2017  . Chronic renal disease, stage III ([place]) 08/09/2017  . Hypomagnesemia 05/04/2017  . Aortic atherosclerosis ([place]) 05/04/2017  . Compression fracture of first lumbar vertebra ([employer]) 05/04/2017  . Degenerative disc disease, lumbar 05/04/2017  . Anemia 02/01/2017  . Atherosclerosis of native arteries of extremity with intermittent claudication  ([place]) 11/09/2016  . Allergic rhinitis 11/07/2016  . Malignant neoplasm of upper-outer quadrant of left female breast ([place]) 10/08/2016  . Osteoporosis screening 09/18/2016  . Postmenopausal 09/18/2016  . Ache in joint 03/09/2016  . Medication monitoring encounter 10/30/2015  . GERD without esophagitis 07/01/2012  . Hyperlipidemia LDL goal <100 07/01/2012    Social History   Tobacco Use  . Smoking status: Never Smoker  . Smokeless tobacco: Never Used  . Tobacco comment: smoking cessation materials not required  Substance Use Topics  . Alcohol use: No    Alcohol/week: 0.0 standard drinks    Current Outpatient Medications:  .  acetaminophen (TYLENOL) 325 MG tablet, Take 2 tablets (650 mg total) by mouth every 6 (six) hours as needed., Disp: 60 tablet, Rfl: 0 .  aspirin 81 MG tablet, Take 81 mg by mouth daily., Disp: , Rfl:  .  atorvastatin (LIPITOR) 20 MG tablet, One by mouth at bedtime three nights a week (Mon, Wed, Fri), Disp: 39 tablet, Rfl: 1 .  Cholecalciferol (VITAMIN D) 2000 units tablet, Take 2,000 Units by mouth 3 (three) times a week. One by mouth twice a week, Disp: , Rfl:  .  ezetimibe (ZETIA) 10 MG tablet, Take 1 tablet (10 mg total) by mouth daily., Disp: 30 tablet, Rfl: 11 .  ferrous sulfate 325 (65 FE) MG EC tablet, Take 325 mg by mouth 3 (three) times a week. , Disp: , Rfl:  .  fluticasone (FLONASE) 50 MCG/ACT nasal spray, Place 1 spray into both nostrils daily as needed for allergies or rhinitis., Disp: 16 g, Rfl: 11 .  levocetirizine (XYZAL) 5 MG tablet, Take 0.5 tablets (2.5 mg total) by mouth every evening., Disp: 30 tablet, Rfl: 0 .  Multiple Vitamin (MULTIVITAMIN WITH MINERALS) TABS tablet, Take 1 tablet by mouth 3 (three) times a week. , Disp: , Rfl:  .  oxyCODONE-acetaminophen (PERCOCET/ROXICET) 5-325 MG tablet, Take 1 tablet by mouth as needed for severe pain., Disp: , Rfl:  .  Polyethyl Glycol-Propyl Glycol (SYSTANE OP), Place 1 drop into both eyes daily as  needed (dry eyes). , Disp: , Rfl:  .  sodium chloride (OCEAN) 0.65 % SOLN nasal spray, Place 1 spray into both nostrils as needed for congestion., Disp: 60 mL, Rfl: 11 .  SSD 1 % cream, APPLY CREAM TOPICALLY TWICE DAILY, Disp: , Rfl: 3 .  sulfamethoxazole-trimethoprim (BACTRIM DS,SEPTRA DS) 800-160 MG tablet, Take 1 tablet by mouth 2 (two) times daily., Disp: 20 tablet, Rfl: 0 .  ondansetron (ZOFRAN ODT) 4 MG disintegrating tablet, Take 1 tablet (4 mg total) by mouth every 8 (eight) hours as needed for nausea or vomiting. (Patient not taking: Reported on 06/21/2018), Disp: 20 tablet, Rfl: 0 No current facility-administered medications for this visit.   Facility-Administered Medications Ordered in Other Visits:  .  heparin lock flush 100 unit/mL, 500 Units, Intravenous, Once, Finnegan, Kathlene November, MD  Allergies  Allergen Reactions  . Latex Itching  . Penicillins Itching and Rash    Has patient had a PCN reaction causing immediate rash, facial/tongue/throat swelling, SOB or lightheadedness with hypotension: Yes Has patient had a PCN reaction causing severe rash involving mucus membranes or skin necrosis: No Has patient had a PCN reaction that required hospitalization No Has patient had a PCN reaction occurring within the last 10 years: No If all of the above answers are "NO", then may proceed with Cephalosporin use.     I personally reviewed active problem list, medication list, allergies with the patient/caregiver today.  ROS  Ten systems reviewed and is negative except as mentioned in HPI.  Objective  Vitals:   06/29/18 0819  BP: 120/68  Pulse: 83  Resp: 14  Temp: 98 F (36.7 C)  TempSrc: Oral  SpO2: 93%  Weight: 170 lb 6.4 oz (77.3 kg)  Height: 5\' 1"  (1.549 m)    Body mass index is 32.2 kg/m.  Nursing Note and Vital Signs reviewed.  Physical Exam  Genitourinary:       Constitutional: Patient appears well-developed and well-nourished. No distress.  HENT: Head:  Normocephalic and atraumatic.Eyes: Conjunctivae and EOM are normal. No scleral icterus. Neck: Normal range of motion. Neck supple. No JVD present. Cardiovascular: Normal rate, regular rhythm and normal heart sounds.  No murmur heard. No BLE edema. Pulmonary/Chest: Effort normal and breath sounds normal. No respiratory distress. Abdominal: Soft. Bowel sounds are normal, no distension. There is no tenderness. No masses. Rectal: No internal masses, no stool impaction, small non-thrombosed, non-bleeding external hemorrhoid that is soft and non-tender.  Musculoskeletal: Normal range of motion, no joint effusions. No gross deformities Neurological: Pt is alert and oriented to person, place, and time. No cranial nerve deficit. Coordination, balance, strength, speech and gait are normal.  Skin: LEFT forearm is erythematous, with non-pitting edema and mild tenderness; her left upper arm is mildly erythematous and mildly edematous, minimal tenderness - unchanged and stable from last visit. Psychiatric: Patient has a normal mood and affect.  behavior is normal. Judgment and thought content normal.  Results for orders placed or performed in visit on 06/29/18 (from the past 72 hour(s))  POCT Occult Blood Stool     Status: None   Collection Time: 06/29/18  8:45 AM  Result Value Ref Range   Fecal Occult Blood, POC Negative Negative   Card #1 Date     Card #2 Fecal Occult Blod, POC     Card #2 Date     Card #3 Fecal Occult Blood, POC     Card #3 Date      Assessment & Plan  1. Bright red rectal bleeding - Advised likely due to straining/small hemorrhoid, discussed high fiber diet in detail.  She does request referral to GI to discuss possible repeat colonoscopy, and this is provided. - Ambulatory referral to Gastroenterology - POCT Occult Blood Stool  2. GERD without esophagitis - Ambulatory referral to Gastroenterology  3. History of colon polyps - Ambulatory referral to Gastroenterology - POCT  Occult Blood Stool  4. Left arm cellulitis - Message sent to heme/onc and OT to determine next steps for follow up.  Advised to complete antibiotics and maintain follow up.  Does have appt in January with Dr. Grayland Ormond  -Red flags and when to present for emergency care or RTC including fever >101.22F, chest pain, shortness of breath, new/worsening/un-resolving symptoms, BRB that is not controlled from rectum, abdominal pain reviewed with patient at time of visit. Follow up and care instructions discussed and provided in AVS.

## 2018-06-29 NOTE — Telephone Encounter (Signed)
Called [name] re: her arm cellulitis not getting any better. Asked patient if she has been taking the antibiotics and she said she is. She denied having any fevers. Asked patient to come into the clinic to be seen by NP for evaluation and she agreed to come today at 10:15.     dhs

## 2018-06-29 NOTE — Patient Instructions (Signed)
High-Fiber Diet  Fiber, also called dietary fiber, is a type of carbohydrate found in fruits, vegetables, whole grains, and beans. A high-fiber diet can have many health benefits. Your health care provider may recommend a high-fiber diet to help:  · Prevent constipation. Fiber can make your bowel movements more regular.  · Lower your cholesterol.  · Relieve hemorrhoids, uncomplicated diverticulosis, or irritable bowel syndrome.  · Prevent overeating as part of a weight-loss plan.  · Prevent heart disease, type 2 diabetes, and certain cancers.    What is my plan?  The recommended daily intake of fiber includes:  · 38 grams for men under age 50.  · 30 grams for men over age 50.  · 25 grams for women under age 50.  · 21 grams for women over age 50.    You can get the recommended daily intake of dietary fiber by eating a variety of fruits, vegetables, grains, and beans. Your health care provider may also recommend a fiber supplement if it is not possible to get enough fiber through your diet.  What do I need to know about a high-fiber diet?  · Fiber supplements have not been widely studied for their effectiveness, so it is better to get fiber through food sources.  · Always check the fiber content on the nutrition facts label of any prepackaged food. Look for foods that contain at least 5 grams of fiber per serving.  · Ask your dietitian if you have questions about specific foods that are related to your condition, especially if those foods are not listed in the following section.  · Increase your daily fiber consumption gradually. Increasing your intake of dietary fiber too quickly may cause bloating, cramping, or gas.  · Drink plenty of water. Water helps you to digest fiber.  What foods can I eat?  Grains  Whole-grain breads. Multigrain cereal. Oats and oatmeal. Brown rice. Barley. Bulgur wheat. Millet. Bran muffins. Popcorn. Rye wafer crackers.  Vegetables   Sweet potatoes. Spinach. Kale. Artichokes. Cabbage. Broccoli. Green peas. Carrots. Squash.  Fruits  Berries. Pears. Apples. Oranges. Avocados. Prunes and raisins. Dried figs.  Meats and Other Protein Sources  Navy, kidney, pinto, and soy beans. Split peas. Lentils. Nuts and seeds.  Dairy  Fiber-fortified yogurt.  Beverages  Fiber-fortified soy milk. Fiber-fortified orange juice.  Other  Fiber bars.  The items listed above may not be a complete list of recommended foods or beverages. Contact your dietitian for more options.  What foods are not recommended?  Grains  White bread. Pasta made with refined flour. White rice.  Vegetables  Fried potatoes. Canned vegetables. Well-cooked vegetables.  Fruits  Fruit juice. Cooked, strained fruit.  Meats and Other Protein Sources  Fatty cuts of meat. Fried poultry or fried fish.  Dairy  Milk. Yogurt. Cream cheese. Sour cream.  Beverages  Soft drinks.  Other  Cakes and pastries. Butter and oils.  The items listed above may not be a complete list of foods and beverages to avoid. Contact your dietitian for more information.  What are some tips for including high-fiber foods in my diet?  · Eat a wide variety of high-fiber foods.  · Make sure that half of all grains consumed each day are whole grains.  · Replace breads and cereals made from refined flour or white flour with whole-grain breads and cereals.  · Replace white rice with brown rice, bulgur wheat, or millet.  · Start the day with a breakfast that is high in fiber,   such as a cereal that contains at least 5 grams of fiber per serving.  · Use beans in place of meat in soups, salads, or pasta.  · Eat high-fiber snacks, such as berries, raw vegetables, nuts, or popcorn.  This information is not intended to replace advice given to you by your health care provider. Make sure you discuss any questions you have with your health care provider.  Document Released: 07/06/2005 Document Revised: 12/12/2015 Document Reviewed: 12/19/2013   Elsevier Interactive Patient Education © 2018 Elsevier Inc.

## 2018-06-29 NOTE — Therapy (Signed)
[facility] [address] [place], [place], [place], [address] Phone: [phone]   Fax:  [phone]  Occupational Therapy screen   Patient Details  Name: Maria Patterson MRN: [id number] Date of Birth: 02-May-1941 No data recorded  Encounter Date: 06/29/2018    Past Medical History:  Diagnosis Date  . Anemia   . Breast cancer ([place])   . Breast mass, left 09/22/2016   RECOMMENDATION: Ultrasound-guided core biopsies of masses at left breast 2 o'clock 2 cm from nipple, left breast 2 o'clock 5 cm from nipple, abnormal left axillary lymph nodes.  Marland Kitchen GERD (gastroesophageal reflux disease)   . Hyperglycemia   . Hyperlipidemia   . Obesity   . Postmenopausal     Past Surgical History:  Procedure Laterality Date  . ABDOMINAL HYSTERECTOMY    . AXILLARY SENTINEL NODE BIOPSY Left 04/14/2017   Procedure: AXILLARY SENTINEL NODE BIOPSY;  Surgeon: Olean Ree, MD;  Location: [facility] ORS;  Service: General;  Laterality: Left;  . BREAST BIOPSY Left 09/30/2016   US biopsy of 3 areas + chemo  . BREAST EXCISIONAL BIOPSY Left 04/14/2017   Left breast invasive cancer left mastectomy  . ESOPHAGOGASTRODUODENOSCOPY (EGD) WITH PROPOFOL N/A 12/24/2014   Procedure: ESOPHAGOGASTRODUODENOSCOPY (EGD) WITH PROPOFOL;  Surgeon: Manya Silvas, MD;  Location: [facility] ENDOSCOPY;  Service: Endoscopy;  Laterality: N/A;  . MASTECTOMY Left 04/14/2017   Left breast invasive cancer  . PORTACATH PLACEMENT Right 10/27/2016   Procedure: INSERTION PORT-A-CATH;  Surgeon: Nestor Lewandowsky, MD;  Location: [facility] ORS;  Service: General;  Laterality: Right;  . TONSILECTOMY, ADENOIDECTOMY, BILATERAL MYRINGOTOMY AND TUBES    . TONSILLECTOMY    . TOTAL MASTECTOMY Left 04/14/2017   Procedure: TOTAL MASTECTOMY;  Surgeon: Olean Ree, MD;  Location: [facility] ORS;  Service: General;  Laterality: Left;    There were no vitals filed for this visit.  Subjective Assessment - 06/29/18 1051     Subjective   My arm cont to be red and little warm - on my 3rd round of antibiotics- arm I think is better - but I see it every day - my phone do not work always     Currently in Pain?  Yes    Pain Score  4     Pain Location  Arm    Pain Orientation  Left    Pain Descriptors / Indicators  Aching;Burning    Pain Type  Acute pain    Pain Onset  1 to 4 weeks ago          LYMPHEDEMA/ONCOLOGY QUESTIONNAIRE - 06/29/18 1029      Right Upper Extremity Lymphedema   Across Hand at [employer] Space  19 cm      Left Upper Extremity Lymphedema   15 cm Proximal to Olecranon Process  32 cm    10 cm Proximal to Olecranon Process  33.5 cm    Olecranon Process  27.5 cm    15 cm Proximal to Ulnar Styloid Process  26 cm    10 cm Proximal to Ulnar Styloid Process  24.2 cm    Just Proximal to Ulnar Styloid Process  17.2 cm    Across Hand at [employer]  18.5 cm        Pt screen again for L UE lymphedema and cellulitis- circumference of L arm compare to taken 2 wks ago - circumference decrease in wrist to distal forearm - but distal forearm cont to be increase by 3 cm compare  to the R - other measurements still increase too see flow sheet.  Pt still red and warm - still on last round of antibiotic. 3rd round of antibiotics  Her volar forearm and wrist has some fibrotic changes from lymphedema and cellulitis - pt has soft tubigrip to wear day and night time with isotoner glove on L hand and forearm to elbow - new on provided until pt is coming in for OT eval on Tues the 17th Dec.                              Patient will benefit from skilled therapeutic intervention in order to improve the following deficits and impairments:     Visit Diagnosis: L forearm cellulites And L UE lymphedema    Problem List Patient Active Problem List   Diagnosis Date Noted  . H/O total mastectomy of left breast 06/03/2018  . Obesity (BMI 30.0-34.9) 03/08/2018  . Insomnia 09/26/2017   . Chronic renal disease, stage III ([place]) 08/09/2017  . Hypomagnesemia 05/04/2017  . Aortic atherosclerosis ([place]) 05/04/2017  . Compression fracture of first lumbar vertebra ([place]) 05/04/2017  . Degenerative disc disease, lumbar 05/04/2017  . Anemia 02/01/2017  . Atherosclerosis of native arteries of extremity with intermittent claudication ([place]) 11/09/2016  . Allergic rhinitis 11/07/2016  . Malignant neoplasm of upper-outer quadrant of left female breast ([place]) 10/08/2016  . Osteoporosis screening 09/18/2016  . Postmenopausal 09/18/2016  . Ache in joint 03/09/2016  . Medication monitoring encounter 10/30/2015  . GERD without esophagitis 07/01/2012  . Hyperlipidemia LDL goal <100 07/01/2012    Rosalyn Gess OTR/L,CLT 06/29/2018, 10:53 AM  [facility] [address] [place], [place], [place], [address] Phone: [phone]   Fax:  [phone]  Name: Jaylise Peek MRN: [id number] Date of Birth: 07/25/1940

## 2018-07-05 ENCOUNTER — Encounter: Payer: Self-pay | Admitting: Occupational Therapy

## 2018-07-05 ENCOUNTER — Ambulatory Visit: Payer: Medicare Other | Attending: Oncology | Admitting: Occupational Therapy

## 2018-07-05 ENCOUNTER — Other Ambulatory Visit: Payer: Self-pay

## 2018-07-05 DIAGNOSIS — I972 Postmastectomy lymphedema syndrome: Secondary | ICD-10-CM | POA: Insufficient documentation

## 2018-07-05 NOTE — Therapy (Signed)
[place] [facility] [address] [place], [address] Phone: [phone]   Fax:  [phone]  Occupational Therapy Evaluation  Patient Details  Name: Maria Patterson MRN: [id number] Date of Birth: 1941-05-19 Referring Provider (OT): Faythe Casa   Encounter Date: 07/05/2018  OT End of Session - 07/05/18 0916    Visit Number  1    Number of Visits  6    Date for OT Re-Evaluation  08/09/18    OT Start Time  0809    OT Stop Time  0853    OT Time Calculation (min)  44 min    Activity Tolerance  Patient tolerated treatment well    Behavior During Therapy  [facility] for tasks assessed/performed       Past Medical History:  Diagnosis Date  . Anemia   . Breast cancer ([place])   . Breast mass, left 09/22/2016   RECOMMENDATION: Ultrasound-guided core biopsies of masses at left breast 2 o'clock 2 cm from nipple, left breast 2 o'clock 5 cm from nipple, abnormal left axillary lymph nodes.  Marland Kitchen GERD (gastroesophageal reflux disease)   . Hyperglycemia   . Hyperlipidemia   . Obesity   . Postmenopausal     Past Surgical History:  Procedure Laterality Date  . ABDOMINAL HYSTERECTOMY    . AXILLARY SENTINEL NODE BIOPSY Left 04/14/2017   Procedure: AXILLARY SENTINEL NODE BIOPSY;  Surgeon: Olean Ree, MD;  Location: [facility] ORS;  Service: General;  Laterality: Left;  . BREAST BIOPSY Left 09/30/2016   US biopsy of 3 areas + chemo  . BREAST EXCISIONAL BIOPSY Left 04/14/2017   Left breast invasive cancer left mastectomy  . ESOPHAGOGASTRODUODENOSCOPY (EGD) WITH PROPOFOL N/A 12/24/2014   Procedure: ESOPHAGOGASTRODUODENOSCOPY (EGD) WITH PROPOFOL;  Surgeon: Manya Silvas, MD;  Location: [facility] ENDOSCOPY;  Service: Endoscopy;  Laterality: N/A;  . MASTECTOMY Left 04/14/2017   Left breast invasive cancer  . PORTACATH PLACEMENT Right 10/27/2016   Procedure: INSERTION PORT-A-CATH;  Surgeon: Nestor Lewandowsky, MD;  Location: [facility] ORS;  Service: General;   Laterality: Right;  . TONSILECTOMY, ADENOIDECTOMY, BILATERAL MYRINGOTOMY AND TUBES    . TONSILLECTOMY    . TOTAL MASTECTOMY Left 04/14/2017   Procedure: TOTAL MASTECTOMY;  Surgeon: Olean Ree, MD;  Location: [facility] ORS;  Service: General;  Laterality: Left;    There were no vitals filed for this visit.  Subjective Assessment - 07/05/18 0858    Subjective   My arm do not feel hot and redness is better- I did finish my antibiotics - but still feel hard my forearm - sometimes pain in shoulder - but I have arthritis     Pertinent History  s/p total mastectomy 04/14/2017.  Completed neoadjuvant Taxotere, carbo, Herceptin and Perjeta on 02/17/2017.  Completed 1 year of Herceptin on 11/03/2017.  Would not benefit from aromatase inhibitor.Pt cellulitis started 06/03/18 in R forearm - did 3 rounds of antibiotics - appear last one improved symptoms - DVT Doppler was negative     Patient Stated Goals  Want my arm swelling and infection better    Currently in Pain?  No/denies        [facility] OT Assessment - 07/05/18 0001      Assessment   Medical Diagnosis  R foreram cellulitis /lymphedema     Referring Provider (OT)  Faythe Casa    Onset Date/Surgical Date  06/03/18    Hand Dominance  Right    Prior Therapy  --   seen 2 x for  screen and temp sleeve     Precautions   Precaution Comments  --   lymphedema      Balance Screen   Has the patient fallen in the past 6 months  No      Home  Environment   Lives With  Alone      Prior Function   Vocation  Retired    Leisure  pick up granddaugther and help with her        LYMPHEDEMA/ONCOLOGY QUESTIONNAIRE - 07/05/18 0837      Left Upper Extremity Lymphedema   15 cm Proximal to Olecranon Process  33.5 cm    10 cm Proximal to Olecranon Process  34.5 cm    Olecranon Process  28 cm    15 cm Proximal to Ulnar Styloid Process  28 cm    10 cm Proximal to Ulnar Styloid Process  24 cm    Just Proximal to Ulnar Styloid Process  17.4 cm    Across Hand  at [employer]  19 cm       Fibrosis in volar forearm on the R  Done some fibrotic techniques Bandage hand to elbow - with isotoner glove, artiflex hand to wrist - 3 x  And Rosidal foam from wrist to elbow  Short stretch 6 cm 3 x thru wrist and hand -to mid forearm  And short stretch 8 cm from wrist , thru hand 1 x and up to elbow  Tubigrip E from proximal forearm to upper arm   pt to keep on for 48 hrs  And do some fisting , wrist and elbow flexion , extention 10 reps  several times during day  If to tight feeling               OT Education - 07/05/18 0916    Education Details  lymphedema , bandages and compression garments wearing     Person(s) Educated  Patient    Methods  Explanation;Demonstration;Handout    Comprehension  Verbalized understanding;Returned demonstration          OT Long Term Goals - 07/05/18 0923      OT LONG TERM GOAL #1   Title  Pt's R UE circumference decrease by more than  1.5 cm in forearm, elbow and upper arm to  and 1 cm at  wrist to get measured for compression sleeve and glove     Baseline  R UE increase to 3 cm at foreram wrist, 1.1cm, elbow 2.5 cm and upper arm 3.5 cm     Time  3    Period  Weeks    Status  New    Target Date  07/26/18      OT LONG TERM GOAL #2   Title  Assess if pt can maintain measurements in R UE  and decreaes risk for cellulitis with over the counter daytime sleeve only     Baseline  Pt meausurements did decrease while she was on the 3rd round of antibiotics but was increase this date and fibrosis in volar foreram     Time  6    Period  Weeks    Status  New    Target Date  08/16/18            Plan - 07/05/18 3500    Clinical Impression Statement  Pt present to OT eval with history R breast CA - mastectomy and history of radiation and chemo - pt developed cellulitis in R forearm 06/03/18 -  had 3 rounds of antibiotics and cont to have increase circumference /lympdedema in R UE - cont to be  increase by 1.1cm at wrist, 3 cm  in forearm, 2.5 cm at elbow and 3.5 cm in upper arm - pt did develop during the last 2 wks more fibrosis in volar foreram because of lymphedema and  not responding to antibioticts the fiirst 2 times - bandage this date her foreram for 48 hrs , with light compression on upper arm - pt can benefit from OT services for CDT to decrease lymphedeme and decrease risk for future episodes of cellulits     Occupational performance deficits (Please refer to evaluation for details):  ADL's    Rehab Potential  Good    OT Frequency  --   1-2 x wk   OT Duration  6 weeks    OT Treatment/Interventions  Self-care/ADL training;Manual lymph drainage;Patient/family education;Compression bandaging;Therapeutic exercise;Manual Therapy    Plan  assess circumference of R UE after compression bandages for 48 hrs     Clinical Decision Making  Limited treatment options, no task modification necessary    OT Home Exercise Plan  see pt instruction    Consulted and Agree with Plan of Care  Patient       Patient will benefit from skilled therapeutic intervention in order to improve the following deficits and impairments:  Increased edema, Pain, Decreased knowledge of precautions  Visit Diagnosis: Postmastectomy lymphedema syndrome - Plan: Ot plan of care cert/re-cert    Problem List Patient Active Problem List   Diagnosis Date Noted  . H/O total mastectomy of left breast 06/03/2018  . Obesity (BMI 30.0-34.9) 03/08/2018  . Insomnia 09/26/2017  . Chronic renal disease, stage III ([place]) 08/09/2017  . Hypomagnesemia 05/04/2017  . Aortic atherosclerosis ([place]) 05/04/2017  . Compression fracture of first lumbar vertebra ([place]) 05/04/2017  . Degenerative disc disease, lumbar 05/04/2017  . Anemia 02/01/2017  . Atherosclerosis of native arteries of extremity with intermittent claudication ([place]) 11/09/2016  . Allergic rhinitis 11/07/2016  . Malignant neoplasm of upper-outer quadrant of left  female breast ([place]) 10/08/2016  . Osteoporosis screening 09/18/2016  . Postmenopausal 09/18/2016  . Ache in joint 03/09/2016  . Medication monitoring encounter 10/30/2015  . GERD without esophagitis 07/01/2012  . Hyperlipidemia LDL goal <100 07/01/2012    Rosalyn Gess OTR/L,CLT 07/05/2018, 9:29 AM  [place] [facility] [address], [place], [address] Phone: [phone]   Fax:  [phone]  Name: Maria Patterson MRN: [id number] Date of Birth: [birth year]

## 2018-07-05 NOTE — Patient Instructions (Signed)
Pt to keep 3 layer bandage on R hand to elbow -and tubigrip E from elbow to upper arm  Do AROM as needed for fisting , wrist and elbow flexion /extention - keep on for 48 hrs

## 2018-07-07 ENCOUNTER — Ambulatory Visit: Payer: Medicare Other | Admitting: Occupational Therapy

## 2018-07-07 DIAGNOSIS — I972 Postmastectomy lymphedema syndrome: Secondary | ICD-10-CM | POA: Diagnosis not present

## 2018-07-07 NOTE — Therapy (Signed)
[place] [facility] [address], [place], [address] Phone: [phone]   Fax:  [phone]  Occupational Therapy Treatment  Patient Details  Name: Maria Patterson MRN: [id number] Date of Birth: 11/03/1940 Referring Provider (OT): Faythe Casa   Encounter Date: 07/07/2018  OT End of Session - 07/07/18 1247    Visit Number  2    Number of Visits  6    Date for OT Re-Evaluation  08/09/18    OT Start Time  1210    OT Stop Time  1231    OT Time Calculation (min)  21 min    Activity Tolerance  Patient tolerated treatment well    Behavior During Therapy  [facility] for tasks assessed/performed       Past Medical History:  Diagnosis Date  . Anemia   . Breast cancer ([place])   . Breast mass, left 09/22/2016   RECOMMENDATION: Ultrasound-guided core biopsies of masses at left breast 2 o'clock 2 cm from nipple, left breast 2 o'clock 5 cm from nipple, abnormal left axillary lymph nodes.  Marland Kitchen GERD (gastroesophageal reflux disease)   . Hyperglycemia   . Hyperlipidemia   . Obesity   . Postmenopausal     Past Surgical History:  Procedure Laterality Date  . ABDOMINAL HYSTERECTOMY    . AXILLARY SENTINEL NODE BIOPSY Left 04/14/2017   Procedure: AXILLARY SENTINEL NODE BIOPSY;  Surgeon: Olean Ree, MD;  Location: [facility] ORS;  Service: General;  Laterality: Left;  . BREAST BIOPSY Left 09/30/2016   US biopsy of 3 areas + chemo  . BREAST EXCISIONAL BIOPSY Left 04/14/2017   Left breast invasive cancer left mastectomy  . ESOPHAGOGASTRODUODENOSCOPY (EGD) WITH PROPOFOL N/A 12/24/2014   Procedure: ESOPHAGOGASTRODUODENOSCOPY (EGD) WITH PROPOFOL;  Surgeon: Manya Silvas, MD;  Location: [facility] ENDOSCOPY;  Service: Endoscopy;  Laterality: N/A;  . MASTECTOMY Left 04/14/2017   Left breast invasive cancer  . PORTACATH PLACEMENT Right 10/27/2016   Procedure: INSERTION PORT-A-CATH;  Surgeon: Nestor Lewandowsky, MD;  Location: [facility] ORS;  Service: General;   Laterality: Right;  . TONSILECTOMY, ADENOIDECTOMY, BILATERAL MYRINGOTOMY AND TUBES    . TONSILLECTOMY    . TOTAL MASTECTOMY Left 04/14/2017   Procedure: TOTAL MASTECTOMY;  Surgeon: Olean Ree, MD;  Location: [facility] ORS;  Service: General;  Laterality: Left;    There were no vitals filed for this visit.  Subjective Assessment - 07/07/18 1246    Subjective   I kept the bandages on and did not get it wet- but ready for it to come off - I know where [facility] is     Patient Stated Goals  Want my arm swelling and infection better    Currently in Pain?  No/denies          LYMPHEDEMA/ONCOLOGY QUESTIONNAIRE - 07/07/18 1218      Right Upper Extremity Lymphedema   15 cm Proximal to Olecranon Process  34 cm    10 cm Proximal to Olecranon Process  32 cm    Olecranon Process  26 cm    15 cm Proximal to Ulnar Styloid Process  26.2 cm    10 cm Proximal to Ulnar Styloid Process  22 cm    Just Proximal to Ulnar Styloid Process  16.3 cm      Left Upper Extremity Lymphedema   15 cm Proximal to Olecranon Process  33 cm    10 cm Proximal to Olecranon Process  35 cm    Olecranon Process  27 cm  15 cm Proximal to Ulnar Styloid Process  27.8 cm    10 cm Proximal to Ulnar Styloid Process  22.4 cm    Just Proximal to Ulnar Styloid Process  16.3 cm    Across Hand at [employer]  18.3 cm        Pt 's bandages removed on L hand and forearm -and measurement taken - did decrease at wrist , distal forearm and elbow  Still increase at proximal forearm with fibrosis - but did improve distally  Pt send to [facility] for measurement of compression sleeve over the counter and gauntlet Cont at night time isotoner glove , tubigrip soft hand to elbow and tubigrip E on elbow /upper arm               OT Education - 07/07/18 1247    Education Details  lymphedema , bandages and compression garments wearing     Person(s) Educated  Patient    Methods  Explanation;Demonstration;Handout     Comprehension  Verbalized understanding;Returned demonstration          OT Long Term Goals - 07/05/18 0923      OT LONG TERM GOAL #1   Title  Pt's R UE circumference decrease by more than  1.5 cm in forearm, elbow and upper arm to  and 1 cm at  wrist to get measured for compression sleeve and glove     Baseline  R UE increase to 3 cm at foreram wrist, 1.1cm, elbow 2.5 cm and upper arm 3.5 cm     Time  3    Period  Weeks    Status  New    Target Date  07/26/18      OT LONG TERM GOAL #2   Title  Assess if pt can maintain measurements in R UE  and decreaes risk for cellulitis with over the counter daytime sleeve only     Baseline  Pt meausurements did decrease while she was on the 3rd round of antibiotics but was increase this date and fibrosis in volar foreram     Time  6    Period  Weeks    Status  New    Target Date  08/16/18            Plan - 07/07/18 1248    Clinical Impression Statement  Pt wore compression bandages on L  hand and forerm for 48 hrs - decrease circumference in wrist and distal foreram - fibrosis decrease but still increase 1.6 at proximal foreram and 1 cm at elbow - upper arm over bicep increaese by 3 cm - pt to get measure for  over the counter compression sleeve / gauntle  this date and follow up with me again in week     Occupational performance deficits (Please refer to evaluation for details):  ADL's    Rehab Potential  Good    OT Frequency  1x / week    OT Duration  6 weeks    OT Treatment/Interventions  Self-care/ADL training;Manual lymph drainage;Patient/family education;Compression bandaging;Therapeutic exercise;Manual Therapy    Plan  assess circumference of LUE , and fit of compression sleeve and gauntle     Clinical Decision Making  Limited treatment options, no task modification necessary    OT Home Exercise Plan  see pt instruction    Consulted and Agree with Plan of Care  Patient       Patient will benefit from skilled therapeutic  intervention in order to  improve the following deficits and impairments:  Increased edema, Pain, Decreased knowledge of precautions  Visit Diagnosis: Postmastectomy lymphedema syndrome    Problem List Patient Active Problem List   Diagnosis Date Noted  . H/O total mastectomy of left breast 06/03/2018  . Obesity (BMI 30.0-34.9) 03/08/2018  . Insomnia 09/26/2017  . Chronic renal disease, stage III ([place]) 08/09/2017  . Hypomagnesemia 05/04/2017  . Aortic atherosclerosis ([place]) 05/04/2017  . Compression fracture of first lumbar vertebra ([place]) 05/04/2017  . Degenerative disc disease, lumbar 05/04/2017  . Anemia 02/01/2017  . Atherosclerosis of native arteries of extremity with intermittent claudication ([place]) 11/09/2016  . Allergic rhinitis 11/07/2016  . 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 Osteoporosis screening 09/18/2016  . Postmenopausal 09/18/2016  . Ache in joint 03/09/2016  . Medication monitoring encounter 10/30/2015  . GERD without esophagitis 07/01/2012  . Hyperlipidemia LDL goal <100 07/01/2012    Rosalyn Gess  OTR/L,CLT 07/07/2018, 12:51 PM  [place] [address] [place], [address] Phone: [phone]   Fax:  [phone]  Name: Maria Patterson MRN: [id number] Date of Birth: 29-Oct-1940

## 2018-07-07 NOTE — Patient Instructions (Signed)
Wear daytime garment and gauntlet during day And night time isotoner glove , soft tubigrip on forearm and hand , And Tubigrip E on upper arm

## 2018-07-15 ENCOUNTER — Ambulatory Visit: Payer: Medicare Other | Admitting: Occupational Therapy

## 2018-07-21 ENCOUNTER — Ambulatory Visit: Payer: Medicare Other | Attending: Oncology | Admitting: Occupational Therapy

## 2018-07-21 DIAGNOSIS — I972 Postmastectomy lymphedema syndrome: Secondary | ICD-10-CM | POA: Diagnosis not present

## 2018-07-21 NOTE — Patient Instructions (Signed)
Pt to wear daytime compression sleeve and gauntlet  And night time cont soft tubigrip on hand to elbow and D tubigrip from wrist to upper arm  Pt to do 1 x 8 cm short stretch bandage from wrist<> hand - and up to upper arm - over lap 50 %

## 2018-07-21 NOTE — Therapy (Signed)
[place] [facility] [address] [place], [address] Phone: [phone]   Fax:  [phone]  Occupational Therapy Treatment  Patient Details  Name: Maria Patterson MRN: [id number] Date of Birth: Jan 06, 1941 Referring Provider (OT): Faythe Casa   Encounter Date: 07/21/2018  OT End of Session - 07/21/18 1018    Visit Number  3    Number of Visits  6    Date for OT Re-Evaluation  08/09/18    OT Start Time  0917    OT Stop Time  0957    OT Time Calculation (min)  40 min    Activity Tolerance  Patient tolerated treatment well    Behavior During Therapy  [facility] for tasks assessed/performed       Past Medical History:  Diagnosis Date  . Anemia   . Breast cancer ([place])   . Breast mass, left 09/22/2016   RECOMMENDATION: Ultrasound-guided core biopsies of masses at left breast 2 o'clock 2 cm from nipple, left breast 2 o'clock 5 cm from nipple, abnormal left axillary lymph nodes.  Marland Kitchen GERD (gastroesophageal reflux disease)   . Hyperglycemia   . Hyperlipidemia   . Obesity   . Postmenopausal     Past Surgical History:  Procedure Laterality Date  . ABDOMINAL HYSTERECTOMY    . AXILLARY SENTINEL NODE BIOPSY Left 04/14/2017   Procedure: AXILLARY SENTINEL NODE BIOPSY;  Surgeon: Olean Ree, MD;  Location: [facility] ORS;  Service: General;  Laterality: Left;  . BREAST BIOPSY Left 09/30/2016   US biopsy of 3 areas + chemo  . BREAST EXCISIONAL BIOPSY Left 04/14/2017   Left breast invasive cancer left mastectomy  . ESOPHAGOGASTRODUODENOSCOPY (EGD) WITH PROPOFOL N/A 12/24/2014   Procedure: ESOPHAGOGASTRODUODENOSCOPY (EGD) WITH PROPOFOL;  Surgeon: Manya Silvas, MD;  Location[facility] ENDOSCOPY;  Service: Endoscopy;  Laterality: N/A;  . MASTECTOMY Left 04/14/2017   Left breast invasive cancer  . PORTACATH PLACEMENT Right 10/27/2016   Procedure: INSERTION PORT-A-CATH;  Surgeon: Nestor Lewandowsky, MD;  Location: [facility] ORS;  Service: General;   Laterality: Right;  . TONSILECTOMY, ADENOIDECTOMY, BILATERAL MYRINGOTOMY AND TUBES    . TONSILLECTOMY    . TOTAL MASTECTOMY Left 04/14/2017   Procedure: TOTAL MASTECTOMY;  Surgeon: Olean Ree, MD;  Location: [facility] ORS;  Service: General;  Laterality: Left;    There were no vitals filed for this visit.  Subjective Assessment - 07/21/18 0920    Subjective   I did get the sleeve - but it is tight - and my arm is still swollen -I hope I don't have to wear this for the rest of my life    Patient Stated Goals  Want my arm swelling and infection better    Currently in Pain?  No/denies          LYMPHEDEMA/ONCOLOGY QUESTIONNAIRE - 07/21/18 0928      Left Upper Extremity Lymphedema   15 cm Proximal to Olecranon Process  32.5 cm    10 cm Proximal to Olecranon Process  34.5 cm    Olecranon Process  27.5 cm    15 cm Proximal to Ulnar Styloid Process  29.3 cm    10 cm Proximal to Ulnar Styloid Process  24.6 cm    Just Proximal to Ulnar Styloid Process  17.4 cm    Across Hand at [employer]  18.5 cm         Pt arrive with her sleeve off - report she had been wearing it every day  and gauntlet - and how much longer she need to wear it  Pt ed on donning and doffing correctly and wearing of it - importance Pt measurements increase compare to last time  And fibrosis increase in forearm since last time again  Pt to cont with tubigrip on arm with isotoner glove at night time - but add 8 cm short stretch bandage from hand<> wrist and up to upper arm - overlapping 50% at night   Will assess next time if pt need bandage again longer period to decrease circumference, fibrosis - and night time compression garment or pump   Pt now stage 2 lymphedema since not decreasing without compression  And took while to get infection decrease  And fibrosis present now               OT Education - 07/21/18 1017    Education Details  lymphedema , bandage over tubigrip at nightime and garments  daytime    Person(s) Educated  Patient    Methods  Explanation;Demonstration;Handout    Comprehension  Verbalized understanding;Returned demonstration          OT Long Term Goals - 07/05/18 0923      OT LONG TERM GOAL #1   Title  Pt's R UE circumference decrease by more than  1.5 cm in forearm, elbow and upper arm to  and 1 cm at  wrist to get measured for compression sleeve and glove     Baseline  R UE increase to 3 cm at foreram wrist, 1.1cm, elbow 2.5 cm and upper arm 3.5 cm     Time  3    Period  Weeks    Status  New    Target Date  07/26/18      OT LONG TERM GOAL #2   Title  Assess if pt can maintain measurements in R UE  and decreaes risk for cellulitis with over the counter daytime sleeve only     Baseline  Pt meausurements did decrease while she was on the 3rd round of antibiotics but was increase this date and fibrosis in volar foreram     Time  6    Period  Weeks    Status  New    Target Date  08/16/18            Plan - 07/21/18 1019    Clinical Impression Statement  Pt wore since 1 1/2 wk ago over the counter compression sleeve and gauntlet- but her circumference increased again in forearm and wrist - and fibrosis in forearm increased - she is in need of more compression at night time than tubigrip - ed pt to do one layer bandage for 4 nights - and will assess if pt need night time compression or pump - appear pt stage 2 lymhedema because of the extended time it took her infection to decrease     Occupational performance deficits (Please refer to evaluation for details):  ADL's    Rehab Potential  Good    OT Frequency  1x / week   1-3 x wk   OT Duration  6 weeks    OT Treatment/Interventions  Self-care/ADL training;Manual lymph drainage;Patient/family education;Compression bandaging;Therapeutic exercise;Manual Therapy    Plan  assess circumference of LUE with compression bandage add night time and , and daytime  compression sleeve and gauntle     Clinical  Decision Making  Limited treatment options, no task modification necessary    OT Home Exercise Plan  see pt  instruction    Consulted and Agree with Plan of Care  Patient       Patient will benefit from skilled therapeutic intervention in order to improve the following deficits and impairments:  Increased edema, Pain, Decreased knowledge of precautions  Visit Diagnosis: Postmastectomy lymphedema syndrome    Problem List Patient Active Problem List   Diagnosis Date Noted  . H/O total mastectomy of left breast 06/03/2018  . Obesity (BMI 30.0-34.9) 03/08/2018  . Insomnia 09/26/2017  . Chronic renal disease, stage III ([place]) 08/09/2017  . Hypomagnesemia 05/04/2017  . Aortic atherosclerosis ([place]) 05/04/2017  . Compression fracture of first lumbar vertebra ([place]) 05/04/2017  . Degenerative disc disease, lumbar 05/04/2017  . Anemia 02/01/2017  . Atherosclerosis of native arteries of extremity with intermittent claudication ([place]) 11/09/2016  . Allergic rhinitis 11/07/2016  . 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 Osteoporosis screening 09/18/2016  . Postmenopausal 09/18/2016  . Ache in joint 03/09/2016  . Medication monitoring encounter 10/30/2015  . GERD without esophagitis 07/01/2012  . Hyperlipidemia LDL goal <100 07/01/2012    Rosalyn Gess OTR/L,CLT 07/21/2018, 4:25 PM  [place] [facility] [address] [place], [address] Phone: [phone]   Fax:  [phone]  Name: Maria Patterson MRN: [id number] Date of Birth: 05/12/1941

## 2018-07-25 ENCOUNTER — Ambulatory Visit: Payer: Medicare Other | Admitting: Occupational Therapy

## 2018-07-25 DIAGNOSIS — I972 Postmastectomy lymphedema syndrome: Secondary | ICD-10-CM

## 2018-07-25 NOTE — Patient Instructions (Signed)
Keep bandages on L UE until Wed   and cont with same ROM exercises she done when she was bandage around Christmas for 48 hrs

## 2018-07-25 NOTE — Therapy (Signed)
[facility] [address], [place], [address] Phone: [phone]   Fax:  [phone]  Occupational Therapy Treatment  Patient Details  Name: Maria Patterson MRN: [id number] Date of Birth: [birth year] Referring Provider (OT): Faythe Casa   Encounter Date: 07/25/2018  OT End of Session - 07/25/18 0935    Visit Number  5    Number of Visits  13    Date for OT Re-Evaluation  08/12/18       Past Medical History:  Diagnosis Date  . Anemia   . Breast cancer ([place])   . Breast mass, left 09/22/2016   RECOMMENDATION: Ultrasound-guided core biopsies of masses at left breast 2 o'clock 2 cm from nipple, left breast 2 o'clock 5 cm from nipple, abnormal left axillary lymph nodes.  Marland Kitchen GERD (gastroesophageal reflux disease)   . Hyperglycemia   . Hyperlipidemia   . Obesity   . Postmenopausal     Past Surgical History:  Procedure Laterality Date  . ABDOMINAL HYSTERECTOMY    . AXILLARY SENTINEL NODE BIOPSY Left 04/14/2017   Procedure: AXILLARY SENTINEL NODE BIOPSY;  Surgeon: Olean Ree, MD;  Location: [facility] ORS;  Service: General;  Laterality: Left;  . BREAST BIOPSY Left 09/30/2016   US biopsy of 3 areas + chemo  . BREAST EXCISIONAL BIOPSY Left 04/14/2017   Left breast invasive cancer left mastectomy  . ESOPHAGOGASTRODUODENOSCOPY (EGD) WITH PROPOFOL N/A 12/24/2014   Procedure: ESOPHAGOGASTRODUODENOSCOPY (EGD) WITH PROPOFOL;  Surgeon: Manya Silvas, MD;  Location: [facility] ENDOSCOPY;  Service: Endoscopy;  Laterality: N/A;  . MASTECTOMY Left 04/14/2017   Left breast invasive cancer  . PORTACATH PLACEMENT Right 10/27/2016   Procedure: INSERTION PORT-A-CATH;  Surgeon: Nestor Lewandowsky, MD;  Location: [facility] ORS;  Service: General;  Laterality: Right;  . TONSILECTOMY, ADENOIDECTOMY, BILATERAL MYRINGOTOMY AND TUBES    . TONSILLECTOMY    . TOTAL MASTECTOMY Left 04/14/2017   Procedure: TOTAL MASTECTOMY;  Surgeon: Olean Ree, MD;   Location: [facility] ORS;  Service: General;  Laterality: Left;    There were no vitals filed for this visit.  Subjective Assessment - 07/25/18 0854    Subjective   My arm is just not doing good- stays swollen - and my upper arm hurt with that sleeve on - so I folded the band down to my middle of my upper arm     Patient Stated Goals  Want my arm swelling and infection better    Currently in Pain?  No/denies          LYMPHEDEMA/ONCOLOGY QUESTIONNAIRE - 07/25/18 0857      Left Upper Extremity Lymphedema   15 cm Proximal to Olecranon Process  32.6 cm    10 cm Proximal to Olecranon Process  34.5 cm    Olecranon Process  28 cm    15 cm Proximal to Ulnar Styloid Process  31 cm    10 cm Proximal to Ulnar Styloid Process  26 cm    Just Proximal to Ulnar Styloid Process  17.7 cm    Across Hand at [employer]  19 cm         pt arrive with no compression on her L arm - pt report she did wear the over the counter sleeve and gauntlet the last 2 wks - 4 days ago her forerarm was increase and full feeling - add for  her daughter to wrap her forearm at night time the last 4 days But her circumference increased again -  and fibrotic again  Felt little warm and looked red  Done 3 layer bandage t    Bandage hand to elbow - with isotoner glove,  10 cm artiflex hand to wrist - 3 x  And Rosidal foam from wrist to elbow  And Artiflex 15 cm from wrist to upper arm  Short stretch 6 cm 3 x thru wrist and hand -to mid forearm  And short stretch 8 cm from wrist , thru hand 1 x and up to elbow  And 10 cm short stretch from forearm to upper arm  Tubigrip E  Over upper arm to keep bandages in place   pt to keep on for 48 hrs  And do some fisting , wrist and elbow flexion , extention 10 reps  several times during day  If to tight feeling    Pt moving into Stage 2 lymphedema - ? Following directions for at home for compression            OT Education - 07/25/18 0935    Education Details   bandages  and HEP     Person(s) Educated  Patient    Methods  Explanation;Demonstration;Handout    Comprehension  Verbalized understanding;Returned demonstration          OT Long Term Goals - 07/05/18 0923      OT LONG TERM GOAL #1   Title  Pt's R UE circumference decrease by more than  1.5 cm in forearm, elbow and upper arm to  and 1 cm at  wrist to get measured for compression sleeve and glove     Baseline  R UE increase to 3 cm at foreram wrist, 1.1cm, elbow 2.5 cm and upper arm 3.5 cm     Time  3    Period  Weeks    Status  New    Target Date  07/26/18      OT LONG TERM GOAL #2   Title  Assess if pt can maintain measurements in R UE  and decreaes risk for cellulitis with over the counter daytime sleeve only     Baseline  Pt meausurements did decrease while she was on the 3rd round of antibiotics but was increase this date and fibrosis in volar foreram     Time  6    Period  Weeks    Status  New    Target Date  08/16/18            Plan - 07/25/18 0853    Clinical Impression Statement  Pt wore over the counter compression sleeve and gauntlet during day for about 2 wks now - and bandage one layer at night time to forearm the last 4 days- but her forearm increase to more than 4 cm compare to the R arm -and upper arm increase by 2 cm - pt report she rolled down her sleeve over the weekend because if was to tight - she was measured 2 wks ago when her arm was smaller and her top band if rolled down will cause turniquet and cause her foreram to swell more - pt now stage 2 lymphedema and in need for 3 layer compression bandages - pt schedule with this OT [facility] Wed incase need her to see the PA for ? cellulitis of forearm     Occupational performance deficits (Please refer to evaluation for details):  ADL's    Rehab Potential  Good    OT Frequency  --   1-3 x  wks    OT Duration  --   3 wks   OT Treatment/Interventions  Self-care/ADL training;Manual lymph  drainage;Patient/family education;Compression bandaging;Therapeutic exercise;Manual Therapy    Plan  assess circumference of LUE with compression bandage add night time and , and daytime  compression sleeve and gauntle     Clinical Decision Making  Limited treatment options, no task modification necessary    OT Home Exercise Plan  see pt instruction    Consulted and Agree with Plan of Care  Patient       Patient will benefit from skilled therapeutic intervention in order to improve the following deficits and impairments:  Increased edema, Pain, Decreased knowledge of precautions  Visit Diagnosis: Postmastectomy lymphedema syndrome - Plan: Ot plan of care cert/re-cert    Problem List Patient Active Problem List   Diagnosis Date Noted  . H/O total mastectomy of left breast 06/03/2018  . Obesity (BMI 30.0-34.9) 03/08/2018  . Insomnia 09/26/2017  . Chronic renal disease, stage III ([place]) 08/09/2017  . Hypomagnesemia 05/04/2017  . Aortic atherosclerosis ([place]) 05/04/2017  . Compression fracture of first lumbar vertebra ([place]) 05/04/2017  . Degenerative disc disease, lumbar 05/04/2017  . Anemia 02/01/2017  . Atherosclerosis of native arteries of extremity with intermittent claudication ([place]) 11/09/2016  . Allergic rhinitis 11/07/2016  . Malignant neoplasm of upper-outer quadrant of left female breast ([place]) 10/08/2016  . Osteoporosis screening 09/18/2016  . Postmenopausal 09/18/2016  . Ache in joint 03/09/2016  . Medication monitoring encounter 10/30/2015  . GERD without esophagitis 07/01/2012  . Hyperlipidemia LDL goal <100 07/01/2012    Rosalyn Gess OTR/L,CLT 07/25/2018, 9:46 AM  [facility] [address], [place], [address] Phone: [phone]   Fax:  [phone]  Name: Maria Patterson MRN: [id number] Date of Birth: 12/10/1940

## 2018-07-27 ENCOUNTER — Inpatient Hospital Stay: Payer: Medicare Other | Attending: Oncology | Admitting: Occupational Therapy

## 2018-07-27 DIAGNOSIS — Z171 Estrogen receptor negative status [ER-]: Secondary | ICD-10-CM | POA: Insufficient documentation

## 2018-07-27 DIAGNOSIS — C50412 Malignant neoplasm of upper-outer quadrant of left female breast: Secondary | ICD-10-CM | POA: Insufficient documentation

## 2018-07-27 DIAGNOSIS — Z79899 Other long term (current) drug therapy: Secondary | ICD-10-CM | POA: Insufficient documentation

## 2018-07-27 DIAGNOSIS — Z7982 Long term (current) use of aspirin: Secondary | ICD-10-CM | POA: Insufficient documentation

## 2018-07-27 DIAGNOSIS — N289 Disorder of kidney and ureter, unspecified: Secondary | ICD-10-CM | POA: Insufficient documentation

## 2018-07-27 DIAGNOSIS — D649 Anemia, unspecified: Secondary | ICD-10-CM | POA: Insufficient documentation

## 2018-07-27 DIAGNOSIS — Z9012 Acquired absence of left breast and nipple: Secondary | ICD-10-CM

## 2018-07-27 DIAGNOSIS — I890 Lymphedema, not elsewhere classified: Secondary | ICD-10-CM | POA: Insufficient documentation

## 2018-07-27 NOTE — Therapy (Signed)
[facility] [address] [place], [place], [place], [address] Phone: [phone]   Fax:  [phone]  Occupational Therapy Treatment  Patient Details  Name: Maria Patterson MRN: [id number] Date of Birth: 01-13-1941 Referring Provider (OT): Faythe Casa   Encounter Date: 07/27/2018    Past Medical History:  Diagnosis Date  . Anemia   . Breast cancer ([place])   . Breast mass, left 09/22/2016   RECOMMENDATION: Ultrasound-guided core biopsies of masses at left breast 2 o'clock 2 cm from nipple, left breast 2 o'clock 5 cm from nipple, abnormal left axillary lymph nodes.  Marland Kitchen GERD (gastroesophageal reflux disease)   . Hyperglycemia   . Hyperlipidemia   . Obesity   . Postmenopausal     Past Surgical History:  Procedure Laterality Date  . ABDOMINAL HYSTERECTOMY    . AXILLARY SENTINEL NODE BIOPSY Left 04/14/2017   Procedure: AXILLARY SENTINEL NODE BIOPSY;  Surgeon: Olean Ree, MD;  Location: [facility] ORS;  Service: General;  Laterality: Left;  . BREAST BIOPSY Left 09/30/2016   US biopsy of 3 areas + chemo  . BREAST EXCISIONAL BIOPSY Left 04/14/2017   Left breast invasive cancer left mastectomy  . ESOPHAGOGASTRODUODENOSCOPY (EGD) WITH PROPOFOL N/A 12/24/2014   Procedure: ESOPHAGOGASTRODUODENOSCOPY (EGD) WITH PROPOFOL;  Surgeon: Manya Silvas, MD;  Location: [facility] ENDOSCOPY;  Service: Endoscopy;  Laterality: N/A;  . MASTECTOMY Left 04/14/2017   Left breast invasive cancer  . PORTACATH PLACEMENT Right 10/27/2016   Procedure: INSERTION PORT-A-CATH;  Surgeon: Nestor Lewandowsky, MD;  Location: [facility] ORS;  Service: General;  Laterality: Right;  . TONSILECTOMY, ADENOIDECTOMY, BILATERAL MYRINGOTOMY AND TUBES    . TONSILLECTOMY    . TOTAL MASTECTOMY Left 04/14/2017   Procedure: TOTAL MASTECTOMY;  Surgeon: Olean Ree, MD;  Location: [facility] ORS;  Service: General;  Laterality: Left;    There were no vitals filed for this visit.  Subjective  Assessment - 07/27/18 0948    Subjective   I hope my measurements is down so I don't need this anymore - had some itching at top of band - but not to much in elbow     Patient Stated Goals  Want my arm swelling and infection better    Currently in Pain?  Yes    Pain Score  1     Pain Location  Arm    Pain Orientation  Left    Pain Descriptors / Indicators  Sore    Pain Type  Acute pain    Pain Onset  1 to 4 weeks ago          LYMPHEDEMA/ONCOLOGY QUESTIONNAIRE - 07/27/18 0905      Left Upper Extremity Lymphedema   15 cm Proximal to Olecranon Process  33 cm    10 cm Proximal to Olecranon Process  34 cm    Olecranon Process  29 cm    15 cm Proximal to Ulnar Styloid Process  28.2 cm    10 cm Proximal to Ulnar Styloid Process  23.3 cm    Just Proximal to Ulnar Styloid Process  16.6 cm    Across Hand at [employer]  19 cm            pt arrive with bandages on L UE  Removed bandages and washed arm - Measurements taken - see flowsheet  Forearm decreased 2.8 cm , wrist 1.1cm but elbow increase 1 cm - but was red from bandaging   Eucerin lotion applied    applied  isotoner glove,  10 cm artiflex hand to wrist - 3 x  And Rosidal foam from wrist to elbow  And Artiflex 15 cm from wrist to upper arm  Short stretch 6 cm 3 x thru wrist and hand -to mid forearm  And short stretch 8 cm from wrist , thru hand 1 x and up to elbow  And 10 cm short stretch from forearm to upper arm  Tubigrip E  Over upper arm to keep bandages in place  pt to keep on for 48 hrs  And do some fisting , wrist and elbow flexion , extention 10 reps several times during day  If to tight feeling  Education again done with pt about lymphedema and that over the counter day sleeve and gauntlet is not going to maintain her LUE That she would need custom - because she increased in over the counter sleeve  - increase fibrosis and she got red and hot   Pt moving into Stage 2 lymphedema - ? Following directions  for at home for compression  Would look into getting her pump for am and pm in combination with daytime compression   Cont to bandages until decrease close to other arm and fibrosis decrease to get measure for custom compression              OT Education - 07/27/18 1003    Education Details  bandaging and lymphedema    Person(s) Educated  Patient    Methods  Explanation;Demonstration;Handout    Comprehension  Verbalized understanding;Returned demonstration                   Patient will benefit from skilled therapeutic intervention in order to improve the following deficits and impairments:     Visit Diagnosis: H/O total mastectomy of left breast    Problem List Patient Active Problem List   Diagnosis Date Noted  . H/O total mastectomy of left breast 06/03/2018  . Obesity (BMI 30.0-34.9) 03/08/2018  . Insomnia 09/26/2017  . Chronic renal disease, stage III ([place]) 08/09/2017  . Hypomagnesemia 05/04/2017  . Aortic atherosclerosis ([place]) 05/04/2017  . Compression fracture of first lumbar vertebra ([place]) 05/04/2017  . Degenerative disc disease, lumbar 05/04/2017  . Anemia 02/01/2017  . Atherosclerosis of native arteries of extremity with intermittent claudication ([place]) 11/09/2016  . Allergic rhinitis 11/07/2016  . Malignant neoplasm of upper-outer quadrant of left female breast ([place]) 10/08/2016  . Osteoporosis screening 09/18/2016  . Postmenopausal 09/18/2016  . Ache in joint 03/09/2016  . Medication monitoring encounter 10/30/2015  . GERD without esophagitis 07/01/2012  . Hyperlipidemia LDL goal <100 07/01/2012    Rosalyn Gess OTR/L,CLT 07/27/2018, 10:04 AM  [facility] [address] [place], [place], [place], [address] Phone: [phone]   Fax:  [phone]  Name: Maria Patterson MRN: [id number] Date of Birth: [birth year]

## 2018-07-29 ENCOUNTER — Ambulatory Visit: Payer: Medicare Other | Admitting: Occupational Therapy

## 2018-07-29 DIAGNOSIS — I972 Postmastectomy lymphedema syndrome: Secondary | ICD-10-CM | POA: Diagnosis not present

## 2018-07-29 NOTE — Progress Notes (Signed)
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  INTERVAL HISTORY: Patient returns to clinic today for routine 16-monthevaluation.  She has worsening lymphedema in her left arm, but otherwise feels well.  She also complains of worsening arthritis.  She has no neurologic complaints. She denies any recent fevers or illnesses. She has a good appetite and denies weight loss. She has no chest pain or shortness of breath. She denies any nausea, vomiting, constipation, or diarrhea. She has no urinary complaints.  Patient offers no further specific complaints today.  REVIEW OF SYSTEMS:   Review of Systems  Constitutional: Negative.  Negative for fever, malaise/fatigue and weight loss.  Respiratory: Negative.  Negative for cough and shortness of breath.   Cardiovascular: Negative.  Negative for chest pain and leg swelling.  Gastrointestinal: Negative.  Negative for abdominal pain and constipation.  Genitourinary: Negative.  Negative for dysuria.  Musculoskeletal: Positive for joint pain. Negative for back pain and myalgias.  Skin: Negative.  Negative for rash.  Neurological: Negative.  Negative for sensory change, focal weakness, weakness and headaches.  Psychiatric/Behavioral: Negative for depression. The patient is not nervous/anxious.     As per HPI. Otherwise, a complete review of systems is  negative.  PAST MEDICAL HISTORY: Past Medical History:  Diagnosis Date  . Anemia   . [place]   . Breast mass, left 09/22/2016   RECOMMENDATION: Ultrasound-guided core biopsies of masses at left breast 2 o'clock 2 cm from nipple, left breast 2 o'clock 5 cm from nipple, abnormal left axillary lymph nodes.  .Marland KitchenGERD (gastroesophageal reflux disease)   . Hyperglycemia   . Hyperlipidemia   . Obesity   . Postmenopausal     PAST SURGICAL HISTORY: Past Surgical History:  Procedure Laterality Date  . ABDOMINAL HYSTERECTOMY    . AXILLARY SENTINEL NODE BIOPSY Left 04/14/2017   Procedure: AXILLARY SENTINEL NODE BIOPSY;  Surgeon: POlean Ree MD;  Location: [facility] ORS;  Service: General;  Laterality: Left;  . BREAST BIOPSY Left 09/30/2016   [place] of 3 areas + chemo  . BREAST EXCISIONAL BIOPSY Left 04/14/2017   Left breast invasive cancer left mastectomy  . ESOPHAGOGASTRODUODENOSCOPY (EGD) WITH PROPOFOL N/A 12/24/2014   Procedure: ESOPHAGOGASTRODUODENOSCOPY (EGD) WITH PROPOFOL;  Surgeon: RManya Silvas MD;  [facility];  Service: Endoscopy;  Laterality: N/A;  . MASTECTOMY Left 04/14/2017   Left breast invasive cancer  . PORTACATH PLACEMENT Right 10/27/2016   Procedure: INSERTION PORT-A-CATH;  Surgeon: TNestor Lewandowsky MD;  Location: [facility] ORS;  Service: General;  Laterality: Right;  . TONSILECTOMY, ADENOIDECTOMY, BILATERAL MYRINGOTOMY AND TUBES    . TONSILLECTOMY    . TOTAL MASTECTOMY Left 04/14/2017   Procedure: TOTAL MASTECTOMY;  Surgeon: POlean Ree MD;  Location: [facility] ORS;  Service: General;  Laterality: Left;    FAMILY HISTORY: Family History  Problem Relation Age of Onset  . Stroke Mother   . Stroke Father   . Cancer Neg Hx   . Depression Neg Hx  ADVANCED DIRECTIVES (Y/N):  N  HEALTH MAINTENANCE: Social History   Tobacco Use  . Smoking status: Never Smoker  . Smokeless tobacco: Never Used  . Tobacco comment: smoking cessation materials not  required  Substance Use Topics  . Alcohol use: No    Alcohol/week: 0.0 standard drinks  . Drug use: No     Colonoscopy:  PAP:  Bone density:  Lipid panel:  Allergies  Allergen Reactions  . Latex Itching  . Penicillins Itching and Rash    Has patient had a PCN reaction causing immediate rash, facial/tongue/throat swelling, SOB or lightheadedness with hypotension: Yes Has patient had a PCN reaction causing severe rash involving mucus membranes or skin necrosis: No Has patient had a PCN reaction that required hospitalization No Has patient had a PCN reaction occurring within the last 10 years: No If all of the above answers are "NO", then may proceed with Cephalosporin use.     Current Outpatient Medications  Medication Sig Dispense Refill  . acetaminophen (TYLENOL) 325 MG tablet Take 2 tablets (650 mg total) by mouth every 6 (six) hours as needed. 60 tablet 0  . aspirin 81 MG tablet Take 81 mg by mouth daily.    Marland Kitchen atorvastatin (LIPITOR) 20 MG tablet One by mouth at bedtime three nights a week (Mon, Wed, Fri) 39 tablet 1  . Cholecalciferol (VITAMIN D) 2000 units tablet Take 2,000 Units by mouth 3 (three) times a week. One by mouth twice a week    . ezetimibe (ZETIA) 10 MG tablet Take 1 tablet (10 mg total) by mouth daily. 30 tablet 11  . ferrous sulfate 325 (65 FE) MG EC tablet Take 325 mg by mouth 3 (three) times a week.     . fluticasone (FLONASE) 50 MCG/ACT nasal spray Place 1 spray into both nostrils daily as needed for allergies or rhinitis. 16 g 11  . levocetirizine (XYZAL) 5 MG tablet Take 0.5 tablets (2.5 mg total) by mouth every evening. 30 tablet 0  . Multiple Vitamin (MULTIVITAMIN WITH MINERALS) TABS tablet Take 1 tablet by mouth 3 (three) times a week.     . ondansetron (ZOFRAN ODT) 4 MG disintegrating tablet Take 1 tablet (4 mg total) by mouth every 8 (eight) hours as needed for nausea or vomiting. 20 tablet 0  . Polyethyl Glycol-Propyl Glycol (SYSTANE OP) Place 1 drop  into both eyes daily as needed (dry eyes).     . sodium chloride (OCEAN) 0.65 % SOLN nasal spray Place 1 spray into both nostrils as needed for congestion. 60 mL 11  . SSD 1 % cream APPLY CREAM TOPICALLY TWICE DAILY  3  . sulfamethoxazole-trimethoprim (BACTRIM DS,SEPTRA DS) 800-160 MG tablet Take 1 tablet by mouth 2 (two) times daily. 20 tablet 0  . oxyCODONE-acetaminophen (PERCOCET/ROXICET) 5-325 MG tablet Take 1 tablet by mouth as needed for severe pain.     No current facility-administered medications for this visit.    Facility-Administered Medications Ordered in Other Visits  Medication Dose Route Frequency Provider Last Rate Last Dose  . heparin lock flush 100 unit/mL  500 Units Intravenous Once Lloyd Huger, MD        OBJECTIVE: Vitals:   08/02/18 1104  BP: (!) 150/78  Pulse: 92  Resp: 18  Temp: 97.6 F (36.4 C)     Body mass index is 34.6 kg/m.    ECOG FS:0 - Asymptomatic  General: Well-developed, well-nourished, no acute distress. Eyes: Pink conjunctiva, anicteric sclera. HEENT: Normocephalic, moist mucous membranes. Breast:  Left mastectomy without evidence of recurrence. Lungs: Clear to auscultation bilaterally. Heart: Regular rate and rhythm. No rubs, murmurs, or gallops. Abdomen: Soft, nontender, nondistended. No organomegaly noted, normoactive bowel sounds. Musculoskeletal: No edema, cyanosis, or clubbing. Neuro: Alert, answering all questions appropriately. Cranial nerves grossly intact. Skin: Left arm lymphedema currently in wrap. Psych: Normal affect.   LAB RESULTS:  Lab Results  Component Value Date   NA 142 06/21/2018   K 4.0 06/21/2018   CL 113 (H) 06/21/2018   CO2 21 06/21/2018   GLUCOSE 74 06/21/2018   BUN 23 06/21/2018   CREATININE 1.38 (H) 06/21/2018   CALCIUM 9.4 06/21/2018   PROT 6.2 06/21/2018   ALBUMIN 3.7 01/15/2018   AST 19 06/21/2018   ALT 13 06/21/2018   ALKPHOS 108 01/15/2018   BILITOT 0.4 06/21/2018   GFRNONAA 37 (L)  06/21/2018   GFRAA 42 (L) 06/21/2018    Lab Results  Component Value Date   WBC 4.2 06/21/2018   NEUTROABS 2,113 06/21/2018   HGB 11.2 (L) 06/21/2018   HCT 33.8 (L) 06/21/2018   MCV 92.1 06/21/2018   PLT 180 06/21/2018     STUDIES: No results found.  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  PLAN:    1. 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 She completed neoadjuvant Taxotere, carboplatinum, Herceptin, and Perjeta on February 17, 2017. She had a total mastectomy on April 14, 2017, therefore did not require adjuvant XRT.  She completed her year long maintenance Herceptin on November 03, 2017.  An aromatase inhibitor would not offer benefit given the ER/PR negativity of her disease.  No further intervention is needed.  Patient's most recent mammogram on September 24, 2017 was reported as BI-RADS 1.  Repeat right screening mammogram in March 2020.  Return to clinic in 6 months for routine evaluation.   2. Anemia: Patient's most recent hemoglobin is decreased, but essentially unchanged at 11.2 3. Renal insufficiency: Creatinine is elevated, but appears approximately her baseline. 4.  Lymphedema: Continue follow-up and treatment per lymphedema clinic. 5.  Arthritis: Continue follow-up and treatment per PCP.  Patient expressed understanding and was in agreement with this plan. She also understands that She can call clinic at any time with any questions, concerns, or complaints.   Cancer Staging Malignant neoplasm of upper-outer quadrant of left female breast [facility]) Staging form: Breast, AJCC 8th Edition - Clinical stage from 10/11/2016: Stage IIB (cT2, cN1, cM0, G3, ER: Negative, PR: Negative, HER2: Positive) - Signed by Lloyd Huger, MD on 10/11/2016 - Pathologic stage from 04/23/2017: No Stage Recommended (ypT0, pN0, cM0, G3, ER: Negative, PR: Negative, HER2: Positive) - Signed by Lloyd Huger, MD on 04/23/2017   Lloyd Huger, MD   08/02/2018 11:34 AM

## 2018-07-29 NOTE — Therapy (Signed)
[facility] [address], [place], [address] Phone: [phone]   Fax:  [phone]  Occupational Therapy Treatment  Patient Details  Name: Maria Patterson MRN: [id number] Date of Birth: September 22, 1940 Referring Provider (OT): Faythe Casa   Encounter Date: 07/29/2018  OT End of Session - 07/29/18 1541    Visit Number  6    Number of Visits  13    Date for OT Re-Evaluation  08/12/18    OT Start Time  0850    OT Stop Time  0922    OT Time Calculation (min)  32 min    Activity Tolerance  Patient tolerated treatment well    Behavior During Therapy  [facility] for tasks assessed/performed       Past Medical History:  Diagnosis Date  . Anemia   . Breast cancer ([place])   . Breast mass, left 09/22/2016   RECOMMENDATION: Ultrasound-guided core biopsies of masses at left breast 2 o'clock 2 cm from nipple, left breast 2 o'clock 5 cm from nipple, abnormal left axillary lymph nodes.  Marland Kitchen GERD (gastroesophageal reflux disease)   . Hyperglycemia   . Hyperlipidemia   . Obesity   . Postmenopausal     Past Surgical History:  Procedure Laterality Date  . ABDOMINAL HYSTERECTOMY    . AXILLARY SENTINEL NODE BIOPSY Left 04/14/2017   Procedure: AXILLARY SENTINEL NODE BIOPSY;  Surgeon: Olean Ree, MD;  Location: [facility] ORS;  Service: General;  Laterality: Left;  . BREAST BIOPSY Left 09/30/2016   US biopsy of 3 areas + chemo  . BREAST EXCISIONAL BIOPSY Left 04/14/2017   Left breast invasive cancer left mastectomy  . ESOPHAGOGASTRODUODENOSCOPY (EGD) WITH PROPOFOL N/A 12/24/2014   Procedure: ESOPHAGOGASTRODUODENOSCOPY (EGD) WITH PROPOFOL;  Surgeon: Manya Silvas, MD;  Location: [facility] ENDOSCOPY;  Service: Endoscopy;  Laterality: N/A;  . MASTECTOMY Left 04/14/2017   Left breast invasive cancer  . PORTACATH PLACEMENT Right 10/27/2016   Procedure: INSERTION PORT-A-CATH;  Surgeon: Nestor Lewandowsky, MD;  Location: [facility] ORS;  Service: General;   Laterality: Right;  . TONSILECTOMY, ADENOIDECTOMY, BILATERAL MYRINGOTOMY AND TUBES    . TONSILLECTOMY    . TOTAL MASTECTOMY Left 04/14/2017   Procedure: TOTAL MASTECTOMY;  Surgeon: Olean Ree, MD;  Location: [facility] ORS;  Service: General;  Laterality: Left;    There were no vitals filed for this visit.  Subjective Assessment - 07/29/18 0854    Subjective   I am impatient with this - I just want this over  - and people ask me what is wrong with my arm -and to think I have to wear sleeve for long time     Patient Stated Goals  Want my arm swelling and infection better    Currently in Pain?  No/denies          LYMPHEDEMA/ONCOLOGY QUESTIONNAIRE - 07/29/18 0858      Left Upper Extremity Lymphedema   15 cm Proximal to Olecranon Process  33 cm    10 cm Proximal to Olecranon Process  34 cm    Olecranon Process  27.5 cm    15 cm Proximal to Ulnar Styloid Process  7.5 cm    10 cm Proximal to Ulnar Styloid Process  23 cm    Just Proximal to Ulnar Styloid Process  16.4 cm    Across Hand at [employer]  20 cm         pt arrive with bandages on L UE   Removed bandages  and washed arm - Measurements taken - see flowsheet  Forearm  And elbow decrease again see flow sheet    Eucerin lotion applied   Applied new  isotoner glove,10 cmartiflex hand to wrist - 3 x  And Rosidal foam from wrist to elbow And Artiflex 15 cm from wrist to upper arm Short stretch 6 cm 3 x thru wrist and hand -to mid forearm  And short stretch 8 cm from wrist , thru hand 1 x and up to elbow And 10 cm short stretch from forearm to upper arm Tubigrip EOver upper arm to keep bandages in place pt to keep on for 48 hrs  And do some fisting , wrist and elbow flexion , extention 10 reps several times during day  If to tight feeling  Education again done with pt about lymphedema and that over the counter day sleeve and gauntlet did maintain  her LUE That she would need custom - because she  increased in over the counter sleeve  - increase fibrosis and she got red and hot  Pt moving into Stage 2 lymphedema - ? Following directions for at home for compression Would look into getting her pump for am and pm in combination with daytime compression - ? Compliance long term for night time compression   Cont to bandages- hope to get measure next session               OT Education - 07/29/18 1541    Education Details  lymphedema - bandaging and compression     Person(s) Educated  Patient    Methods  Explanation;Demonstration;Handout    Comprehension  Verbalized understanding;Returned demonstration          OT Long Term Goals - 07/05/18 0923      OT LONG TERM GOAL #1   Title  Pt's R UE circumference decrease by more than  1.5 cm in forearm, elbow and upper arm to  and 1 cm at  wrist to get measured for compression sleeve and glove     Baseline  R UE increase to 3 cm at foreram wrist, 1.1cm, elbow 2.5 cm and upper arm 3.5 cm     Time  3    Period  Weeks    Status  New    Target Date  07/26/18      OT LONG TERM GOAL #2   Title  Assess if pt can maintain measurements in R UE  and decreaes risk for cellulitis with over the counter daytime sleeve only     Baseline  Pt meausurements did decrease while she was on the 3rd round of antibiotics but was increase this date and fibrosis in volar foreram     Time  6    Period  Weeks    Status  New    Target Date  08/16/18            Plan - 07/29/18 1542    Clinical Impression Statement  Pt L UE circumference decrease from wrist to elbow - pt still increase little over 1 cm and upper arm - pt bandage again - should be ready Monday to get measure for custom [employer] soft sleeve and glove -pt need to be educated every session on lymphedema and that compression need to be wore - that her arm increase in size with over the counter sleeve - would recommend pump for her instead of night sleeve for compliance purposes      Occupational performance deficits (  Please refer to evaluation for details):  ADL's    Rehab Potential  Good    OT Frequency  3x / week    OT Duration  --   3 wks    OT Treatment/Interventions  Self-care/ADL training;Manual lymph drainage;Patient/family education;Compression bandaging;Therapeutic exercise;Manual Therapy    Plan  assess circumference of LUE with compression bandage - if can be send for custom garments measurements and pump    Clinical Decision Making  Limited treatment options, no task modification necessary    OT Home Exercise Plan  see pt instruction    Consulted and Agree with Plan of Care  Patient       Patient will benefit from skilled therapeutic intervention in order to improve the following deficits and impairments:  Increased edema, Pain, Decreased knowledge of precautions  Visit Diagnosis: Postmastectomy lymphedema syndrome    Problem List Patient Active Problem List   Diagnosis Date Noted  . H/O total mastectomy of left breast 06/03/2018  . Obesity (BMI 30.0-34.9) 03/08/2018  . Insomnia 09/26/2017  . Chronic renal disease, stage III ([place]) 08/09/2017  . Hypomagnesemia 05/04/2017  . Aortic atherosclerosis ([place]) 05/04/2017  . Compression fracture of first lumbar vertebra ([place]) 05/04/2017  . Degenerative disc disease, lumbar 05/04/2017  . Anemia 02/01/2017  . Atherosclerosis of native arteries of extremity with intermittent claudication ([place]) 11/09/2016  . Allergic rhinitis 11/07/2016  . Malignant neoplasm of upper-outer quadrant of left female breast ([place]) 10/08/2016  . Osteoporosis screening 09/18/2016  . Postmenopausal 09/18/2016  . Ache in joint 03/09/2016  . Medication monitoring encounter 10/30/2015  . GERD without esophagitis 07/01/2012  . Hyperlipidemia LDL goal <100 07/01/2012    Rosalyn Gess OTR/L,CLT 07/29/2018, 3:47 PM  [facility] [address] [place],  [address] Phone: [phone]   Fax:  [phone]  Name: Maria Patterson MRN: [id number] Date of Birth: 21-Sep-1940

## 2018-07-29 NOTE — Patient Instructions (Signed)
Same

## 2018-08-01 ENCOUNTER — Ambulatory Visit: Payer: Medicare Other | Admitting: Occupational Therapy

## 2018-08-01 DIAGNOSIS — I972 Postmastectomy lymphedema syndrome: Secondary | ICD-10-CM | POA: Diagnosis not present

## 2018-08-01 NOTE — Patient Instructions (Signed)
Pt ed on how family need to do her bandages - steps provided and written down  To be done or change today and Wed I'll redo bandages this weekend

## 2018-08-01 NOTE — Therapy (Signed)
[facility] [address] [place], [address] Phone: [phone]   Fax:  [phone]  Occupational Therapy Treatment  Patient Details  Name: Maria Patterson MRN: [id number] Date of Birth: [birth year] Referring Provider (OT): Faythe Casa   Encounter Date: 08/01/2018  OT End of Session - 08/01/18 0955    Visit Number  7    Number of Visits  13    Date for OT Re-Evaluation  08/12/18    OT Start Time  0908    OT Stop Time  0946    OT Time Calculation (min)  38 min    Activity Tolerance  Patient tolerated treatment well    Behavior During Therapy  [facility] for tasks assessed/performed       Past Medical History:  Diagnosis Date  . Anemia   . Breast cancer ([place])   . Breast mass, left 09/22/2016   RECOMMENDATION: Ultrasound-guided core biopsies of masses at left breast 2 o'clock 2 cm from nipple, left breast 2 o'clock 5 cm from nipple, abnormal left axillary lymph nodes.  Marland Kitchen GERD (gastroesophageal reflux disease)   . Hyperglycemia   . Hyperlipidemia   . Obesity   . Postmenopausal     Past Surgical History:  Procedure Laterality Date  . ABDOMINAL HYSTERECTOMY    . AXILLARY SENTINEL NODE BIOPSY Left 04/14/2017   Procedure: AXILLARY SENTINEL NODE BIOPSY;  Surgeon: Olean Ree, MD;  Location: [facility] ORS;  Service: General;  Laterality: Left;  . BREAST BIOPSY Left 09/30/2016   US biopsy of 3 areas + chemo  . BREAST EXCISIONAL BIOPSY Left 04/14/2017   Left breast invasive cancer left mastectomy  . ESOPHAGOGASTRODUODENOSCOPY (EGD) WITH PROPOFOL N/A 12/24/2014   Procedure: ESOPHAGOGASTRODUODENOSCOPY (EGD) WITH PROPOFOL;  Surgeon: Manya Silvas, MD;  Location: [facility] ENDOSCOPY;  Service: Endoscopy;  Laterality: N/A;  . MASTECTOMY Left 04/14/2017   Left breast invasive cancer  . PORTACATH PLACEMENT Right 10/27/2016   Procedure: INSERTION PORT-A-CATH;  Surgeon: Nestor Lewandowsky, MD;  Location: [facility] ORS;  Service: General;   Laterality: Right;  . TONSILECTOMY, ADENOIDECTOMY, BILATERAL MYRINGOTOMY AND TUBES    . TONSILLECTOMY    . TOTAL MASTECTOMY Left 04/14/2017   Procedure: TOTAL MASTECTOMY;  Surgeon: Olean Ree, MD;  Location: [facility] ORS;  Service: General;  Laterality: Left;    There were no vitals filed for this visit.  Subjective Assessment - 08/01/18 0953    Subjective   I am getting tired of this bandages - if my daughter and granddaugther have time - they can bandage my arm - will maybe later today - my bandage slid some over my elbow     Patient Stated Goals  Want my arm swelling and infection better    Currently in Pain?  No/denies          LYMPHEDEMA/ONCOLOGY QUESTIONNAIRE - 08/01/18 0914      Left Upper Extremity Lymphedema   15 cm Proximal to Olecranon Process  32.8 cm    10 cm Proximal to Olecranon Process  35 cm    Olecranon Process  27.5 cm    15 cm Proximal to Ulnar Styloid Process  27 cm    10 cm Proximal to Ulnar Styloid Process  23 cm    Just Proximal to Ulnar Styloid Process  16.5 cm    Across Hand at [employer]  19.5 cm         pt arrive withbandages on L UEbut it slipped at elbow and  distal upper arm did not had compression   Removed bandages and washed arm - Measurements taken - see flowsheet  Forearm  And elbow still decreased  Pt ed on bandaging to be done at home -family to do it later today and Wed - OT will check Friday - but can't stay out of compression more than 2 hrs   hand out done and review and showed pt sequence to be done at home :  [place] 9;  10 cmartiflex hand to wrist - 3 x  And Rosidal foam from wrist to elbow And Artiflex 15 cm from wrist to upper arm Short stretch 6 cm 3 x thru wrist and hand -to mid forearm  And short stretch 8 cm from wrist , thru hand 1 x and up to elbow And 10 cm short stretch from forearm to upper arm Tubigrip EOver upper arm to keep bandages in place pt to keep on  for 48 hrs   And do some fisting , wrist and elbow flexion , extention 10 reps several times during day  If to tight feeling  Education again done with pt about lymphedema and that over the counter day sleeve and gauntlet did maintain  her LUE That she would need custom - because she increased in over the counter sleeve - increase fibrosis and she got red and hot  Pt moving into Stage 2 lymphedema - ? Following directions for at home for compression Would look into getting her pump for am and pm in combination with daytime compression - ? Compliance long term for night time compression                OT Education - 08/01/18 0955    Education Details  bandages -and going to DME for measurements    Person(s) Educated  Patient    Methods  Explanation;Demonstration;Handout    Comprehension  Verbalized understanding;Returned demonstration          OT Long Term Goals - 07/05/18 0923      OT LONG TERM GOAL #1   Title  Pt's R UE circumference decrease by more than  1.5 cm in forearm, elbow and upper arm to  and 1 cm at  wrist to get measured for compression sleeve and glove     Baseline  R UE increase to 3 cm at foreram wrist, 1.1cm, elbow 2.5 cm and upper arm 3.5 cm     Time  3    Period  Weeks    Status  New    Target Date  07/26/18      OT LONG TERM GOAL #2   Title  Assess if pt can maintain measurements in R UE  and decreaes risk for cellulitis with over the counter daytime sleeve only     Baseline  Pt meausurements did decrease while she was on the 3rd round of antibiotics but was increase this date and fibrosis in volar foreram     Time  6    Period  Weeks    Status  New    Target Date  08/16/18            Plan - 08/01/18 0956    Clinical Impression Statement  Pt L Ue circumference decrease again some more at forearm but over the weekend bandages slid at elbow and distal upper arm -and pt 's measurements increaese - but contact Teresa at DME to  measure her 1 cm smaller at distal  upper arm - pt ed and written instructions provided for chaging bandages at home by her family - will check and reassess her again Friday - pt to keep bandages on     Occupational performance deficits (Please refer to evaluation for details):  ADL's    Rehab Potential  Good    OT Frequency  1x / week    OT Duration  --   3 wks   OT Treatment/Interventions  Self-care/ADL training;Manual lymph drainage;Patient/family education;Compression bandaging;Therapeutic exercise;Manual Therapy    Plan  assess circumference of LUE with compression bandage done by famiy while waiting for her custom sleeve and glove     Clinical Decision Making  Limited treatment options, no task modification necessary    OT Home Exercise Plan  see pt instruction    Consulted and Agree with Plan of Care  Patient       Patient will benefit from skilled therapeutic intervention in order to improve the following deficits and impairments:  Increased edema, Pain, Decreased knowledge of precautions  Visit Diagnosis: Postmastectomy lymphedema syndrome    Problem List Patient Active Problem List   Diagnosis Date Noted  . H/O total mastectomy of left breast 06/03/2018  . Obesity (BMI 30.0-34.9) 03/08/2018  . Insomnia 09/26/2017  . Chronic renal disease, stage III ([place]) 08/09/2017  . Hypomagnesemia 05/04/2017  . Aortic atherosclerosis ([place]) 05/04/2017  . Compression fracture of first lumbar vertebra ([place]) 05/04/2017  . Degenerative disc disease, lumbar 05/04/2017  . Anemia 02/01/2017  . Atherosclerosis of native arteries of extremity with intermittent claudication ([place]) 11/09/2016  . Allergic rhinitis 11/07/2016  . Malignant neoplasm of upper-outer quadrant of left female breast ([place]) 10/08/2016  . Osteoporosis screening 09/18/2016  . Postmenopausal 09/18/2016  . Ache in joint 03/09/2016  . Medication monitoring encounter 10/30/2015  . GERD without esophagitis 07/01/2012  .  Hyperlipidemia LDL goal <100 07/01/2012    Rosalyn Gess OTR/L,CLT 08/01/2018, 10:06 AM  [facility] [address] [place], [address] Phone: [phone]   Fax:  [phone]  Name: Maria Patterson MRN: [id number] Date of Birth: 1940-12-15

## 2018-08-02 ENCOUNTER — Other Ambulatory Visit: Payer: Self-pay

## 2018-08-02 ENCOUNTER — Encounter: Payer: Self-pay | Admitting: Oncology

## 2018-08-02 ENCOUNTER — Inpatient Hospital Stay: Payer: Medicare Other | Admitting: Oncology

## 2018-08-02 VITALS — BP 150/78 | HR 92 | Temp 97.6°F | Resp 18 | Wt 183.1 lb

## 2018-08-02 DIAGNOSIS — Z79899 Other long term (current) drug therapy: Secondary | ICD-10-CM

## 2018-08-02 DIAGNOSIS — Z171 Estrogen receptor negative status [ER-]: Secondary | ICD-10-CM | POA: Diagnosis not present

## 2018-08-02 DIAGNOSIS — Z7982 Long term (current) use of aspirin: Secondary | ICD-10-CM

## 2018-08-02 DIAGNOSIS — C50412 Malignant neoplasm of upper-outer quadrant of left female breast: Secondary | ICD-10-CM

## 2018-08-02 DIAGNOSIS — D649 Anemia, unspecified: Secondary | ICD-10-CM

## 2018-08-02 DIAGNOSIS — N289 Disorder of kidney and ureter, unspecified: Secondary | ICD-10-CM | POA: Diagnosis not present

## 2018-08-02 DIAGNOSIS — I890 Lymphedema, not elsewhere classified: Secondary | ICD-10-CM | POA: Diagnosis not present

## 2018-08-02 NOTE — Progress Notes (Signed)
Patient here today for follow up regarding breast cancer. Patient reports left breast pain and right shoulder pain. Patient also reports nausea.

## 2018-08-05 ENCOUNTER — Ambulatory Visit: Payer: Medicare Other | Admitting: Occupational Therapy

## 2018-08-05 DIAGNOSIS — I972 Postmastectomy lymphedema syndrome: Secondary | ICD-10-CM

## 2018-08-05 NOTE — Patient Instructions (Signed)
Cont to bandage her L arm 2 x wks and OT will check one time week

## 2018-08-05 NOTE — Therapy (Signed)
[facility] [address] [place], [address] Phone: [phone]   Fax:  [phone]  Occupational Therapy Treatment  Patient Details  Name: Maria Patterson MRN: [id number] Date of Birth: 06-10-1941 Referring Provider (OT): Faythe Casa   Encounter Date: 08/05/2018  OT End of Session - 08/05/18 1035    Visit Number  8    Number of Visits  13    Date for OT Re-Evaluation  08/12/18    OT Start Time  0858    OT Stop Time  0937    OT Time Calculation (min)  39 min    Activity Tolerance  Patient tolerated treatment well    Behavior During Therapy  [facility] for tasks assessed/performed       Past Medical History:  Diagnosis Date  . Anemia   . Breast cancer ([place])   . Breast mass, left 09/22/2016   RECOMMENDATION: Ultrasound-guided core biopsies of masses at left breast 2 o'clock 2 cm from nipple, left breast 2 o'clock 5 cm from nipple, abnormal left axillary lymph nodes.  Marland Kitchen GERD (gastroesophageal reflux disease)   . Hyperglycemia   . Hyperlipidemia   . Obesity   . Postmenopausal     Past Surgical History:  Procedure Laterality Date  . ABDOMINAL HYSTERECTOMY    . AXILLARY SENTINEL NODE BIOPSY Left 04/14/2017   Procedure: AXILLARY SENTINEL NODE BIOPSY;  Surgeon: Olean Ree, MD;  Location: [facility] ORS;  Service: General;  Laterality: Left;  . BREAST BIOPSY Left 09/30/2016   US biopsy of 3 areas + chemo  . BREAST EXCISIONAL BIOPSY Left 04/14/2017   Left breast invasive cancer left mastectomy  . ESOPHAGOGASTRODUODENOSCOPY (EGD) WITH PROPOFOL N/A 12/24/2014   Procedure: ESOPHAGOGASTRODUODENOSCOPY (EGD) WITH PROPOFOL;  Surgeon: Manya Silvas, MD;  Location: [facility] ENDOSCOPY;  Service: Endoscopy;  Laterality: N/A;  . MASTECTOMY Left 04/14/2017   Left breast invasive cancer  . PORTACATH PLACEMENT Right 10/27/2016   Procedure: INSERTION PORT-A-CATH;  Surgeon: Nestor Lewandowsky, MD;  Location: [facility] ORS;  Service: General;   Laterality: Right;  . TONSILECTOMY, ADENOIDECTOMY, BILATERAL MYRINGOTOMY AND TUBES    . TONSILLECTOMY    . TOTAL MASTECTOMY Left 04/14/2017   Procedure: TOTAL MASTECTOMY;  Surgeon: Olean Ree, MD;  Location: [facility] ORS;  Service: General;  Laterality: Left;    There were no vitals filed for this visit.  Subjective Assessment - 08/05/18 1033    Subjective   I did the bandages myself - my daughter and granddaughter could not help me - I did enjoy taking shower while the bandages were off     Patient Stated Goals  Want my arm swelling and infection better    Currently in Pain?  No/denies          LYMPHEDEMA/ONCOLOGY QUESTIONNAIRE - 08/05/18 0904      Left Upper Extremity Lymphedema   15 cm Proximal to Olecranon Process  32.5 cm    10 cm Proximal to Olecranon Process  34 cm    Olecranon Process  28.2 cm    15 cm Proximal to Ulnar Styloid Process  27.8 cm    10 cm Proximal to Ulnar Styloid Process  25 cm    Just Proximal to Ulnar Styloid Process  17.5 cm    Across Hand at [employer]  18.5 cm      Pt arrive with her bandages on - report she had to do it herself - had little bit of hard time - hope  she was able to keep swelling down   No pain  Removed bandages and washed arm -  Eucerin lotion applied  Review with pt steps of bandages - pt has handout  applied isotoner glove,10 cmartiflex hand to wrist - 3 x  And Rosidal foam from wrist to elbow And Artiflex 15 cm from wrist to upper arm Short stretch 6 cm 3 x thru wrist and hand -to mid forearm  And short stretch 8 cm from wrist , thru hand 1 x and up to elbow And 10 cm short stretch from forearm to upper arm Tubigrip EOver upper arm to keep bandages in place Pt can change as much as she want - but not more than 48hrs would recommend   Cont to  do some fisting , wrist and elbow flexion , extention 10 reps several times during day  If to tight feeling  Education again done with pt about lymphedema and  that over the counter day sleeve and gauntlet is not going to maintain her LUE That she need custom - because she increased in over the counter sleeve  - increase fibrosis and she got red and hot   Stage 2 lymphedema - ? Following directions for at home for compression Recommend  pump for am and pm in combination with daytime compression - for compliance                   OT Education - 08/05/18 1035    Education Details  review self bandaging at home again     [facility]) Educated  Patient    Methods  Explanation;Demonstration;Handout    Comprehension  Verbalized understanding;Returned demonstration          OT Long Term Goals - 07/05/18 0923      OT LONG TERM GOAL #1   Title  Pt's R UE circumference decrease by more than  1.5 cm in forearm, elbow and upper arm to  and 1 cm at  wrist to get measured for compression sleeve and glove     Baseline  R UE increase to 3 cm at foreram wrist, 1.1cm, elbow 2.5 cm and upper arm 3.5 cm     Time  3    Period  Weeks    Status  New    Target Date  07/26/18      OT LONG TERM GOAL #2   Title  Assess if pt can maintain measurements in R UE  and decreaes risk for cellulitis with over the counter daytime sleeve only     Baseline  Pt meausurements did decrease while she was on the 3rd round of antibiotics but was increase this date and fibrosis in volar foreram     Time  6    Period  Weeks    Status  New    Target Date  08/16/18            Plan - 08/05/18 1036    Clinical Impression Statement  Pt got measured last week for custom [employer] soft sleeve - and glove -pt changing her bandages 2 x wk at home and OT check this date if pt maintaining her circumference - pt did increase some in forearm to upper arm - review again steps of bandaging - and check with rep about pump     Occupational performance deficits (Please refer to evaluation for details):  ADL's    Rehab Potential  Good    OT Frequency  1x / week  OT  Duration  2 weeks    OT Treatment/Interventions  Self-care/ADL training;Manual lymph drainage;Patient/family education;Compression bandaging;Therapeutic exercise;Manual Therapy    Plan  assess circumference of LUE with compression bandage been done by pt /famiy while waiting for her custom sleeve and glove     Clinical Decision Making  Limited treatment options, no task modification necessary    OT Home Exercise Plan  see pt instruction    Consulted and Agree with Plan of Care  Patient       Patient will benefit from skilled therapeutic intervention in order to improve the following deficits and impairments:  Increased edema, Pain, Decreased knowledge of precautions  Visit Diagnosis: Postmastectomy lymphedema syndrome    Problem List Patient Active Problem List   Diagnosis Date Noted  . H/O total mastectomy of left breast 06/03/2018  . Obesity (BMI 30.0-34.9) 03/08/2018  . Insomnia 09/26/2017  . Chronic renal disease, stage III ([place]) 08/09/2017  . Hypomagnesemia 05/04/2017  . Aortic atherosclerosis ([place]) 05/04/2017  . Compression fracture of first lumbar vertebra ([place]) 05/04/2017  . Degenerative disc disease, lumbar 05/04/2017  . Anemia 02/01/2017  . Atherosclerosis of native arteries of extremity with intermittent claudication ([place]) 11/09/2016  . Allergic rhinitis 11/07/2016  . Malignant neoplasm of upper-outer quadrant of left female breast ([place]) 10/08/2016  . Osteoporosis screening 09/18/2016  . Postmenopausal 09/18/2016  . Ache in joint 03/09/2016  . Medication monitoring encounter 10/30/2015  . GERD without esophagitis 07/01/2012  . Hyperlipidemia LDL goal <100 07/01/2012    Rosalyn Gess OTR/L,CLT 08/05/2018, 10:39 AM  [facility] [address] [place], [address] Phone: [phone]   Fax:  [phone]  Name: Maria Patterson MRN: [id number] Date of Birth: 11-17-1940

## 2018-08-08 ENCOUNTER — Encounter: Payer: Medicare Other | Admitting: Occupational Therapy

## 2018-08-11 ENCOUNTER — Ambulatory Visit: Payer: Medicare Other | Admitting: Occupational Therapy

## 2018-08-11 DIAGNOSIS — I972 Postmastectomy lymphedema syndrome: Secondary | ICD-10-CM

## 2018-08-11 NOTE — Therapy (Signed)
[place] [facility] [address] [place], [address] Phone: [phone]   Fax:  [phone]  Occupational Therapy Treatment  Patient Details  Name: Maria Patterson MRN: [id number] Date of Birth: [birth year] Referring Provider (OT): Faythe Casa   Encounter Date: 08/11/2018  OT End of Session - 08/11/18 1039    Visit Number  9    Number of Visits  13    Date for OT Re-Evaluation  08/12/18    OT Start Time  1026    OT Stop Time  1121    OT Time Calculation (min)  55 min    Activity Tolerance  Patient tolerated treatment well    Behavior During Therapy  [facility] for tasks assessed/performed       Past Medical History:  Diagnosis Date  . Anemia   . Breast cancer ([place])   . Breast mass, left 09/22/2016   RECOMMENDATION: Ultrasound-guided core biopsies of masses at left breast 2 o'clock 2 cm from nipple, left breast 2 o'clock 5 cm from nipple, abnormal left axillary lymph nodes.  Marland Kitchen GERD (gastroesophageal reflux disease)   . Hyperglycemia   . Hyperlipidemia   . Obesity   . Postmenopausal     Past Surgical History:  Procedure Laterality Date  . ABDOMINAL HYSTERECTOMY    . AXILLARY SENTINEL NODE BIOPSY Left 04/14/2017   Procedure: AXILLARY SENTINEL NODE BIOPSY;  Surgeon: Olean Ree, MD;  Location: [facility] ORS;  Service: General;  Laterality: Left;  . BREAST BIOPSY Left 09/30/2016   US biopsy of 3 areas + chemo  . BREAST EXCISIONAL BIOPSY Left 04/14/2017   Left breast invasive cancer left mastectomy  . ESOPHAGOGASTRODUODENOSCOPY (EGD) WITH PROPOFOL N/A 12/24/2014   Procedure: ESOPHAGOGASTRODUODENOSCOPY (EGD) WITH PROPOFOL;  Surgeon: Manya Silvas, MD;  Location: [facility] ENDOSCOPY;  Service: Endoscopy;  Laterality: N/A;  . MASTECTOMY Left 04/14/2017   Left breast invasive cancer  . PORTACATH PLACEMENT Right 10/27/2016   Procedure: INSERTION PORT-A-CATH;  Surgeon: Nestor Lewandowsky, MD;  Location: [facility] ORS;  Service: General;   Laterality: Right;  . TONSILECTOMY, ADENOIDECTOMY, BILATERAL MYRINGOTOMY AND TUBES    . TONSILLECTOMY    . TOTAL MASTECTOMY Left 04/14/2017   Procedure: TOTAL MASTECTOMY;  Surgeon: Olean Ree, MD;  Location: [facility] ORS;  Service: General;  Laterality: Left;    There were no vitals filed for this visit.  Subjective Assessment - 08/11/18 1037    Subjective   My heat went out over the weekend and this am it was out again - so I was trying to get warm water and my bandages got wet - she did call me about my sleeve and glove being in - but I did not pick it up - wanted to do one trip with using gas     Patient Stated Goals  Want my arm swelling and infection better    Currently in Pain?  No/denies          LYMPHEDEMA/ONCOLOGY QUESTIONNAIRE - 08/11/18 1030      Left Upper Extremity Lymphedema   15 cm Proximal to Olecranon Process  32 cm    10 cm Proximal to Olecranon Process  34 cm    Olecranon Process  28 cm    15 cm Proximal to Ulnar Styloid Process  28 cm    10 cm Proximal to Ulnar Styloid Process  23.5 cm    Just Proximal to Ulnar Styloid Process  17 cm    Across Hand at [employer]  Space  19 cm        pt arrive with out her bandages - measurements about the same except distal forearm decrease  Pt left to pick up her compression sleeve and glove   pt fitted with [employer] El[employer] soft sleeve and glove - pt ed on donning , doffing and wearing  As well as washing glove to stretch fingers with markers   Did contact the Rep about pump -they will fit pump with pt today  Pt ed on using pump am and pm for about 45-60 min and daytime compression sleeve and glove during day  Pt to follow up in a week               OT Education - 08/11/18 1039    Education Details  use of pump in combination with her daytime compression sleeve     Person(s) Educated  Patient    Methods  Explanation;Demonstration;Handout    Comprehension  Verbalized understanding;Returned demonstration           OT Long Term Goals - 07/05/18 0923      OT LONG TERM GOAL #1   Title  Pt's R UE circumference decrease by more than  1.5 cm in forearm, elbow and upper arm to  and 1 cm at  wrist to get measured for compression sleeve and glove     Baseline  R UE increase to 3 cm at foreram wrist, 1.1cm, elbow 2.5 cm and upper arm 3.5 cm     Time  3    Period  Weeks    Status  New    Target Date  07/26/18      OT LONG TERM GOAL #2   Title  Assess if pt can maintain measurements in R UE  and decreaes risk for cellulitis with over the counter daytime sleeve only     Baseline  Pt meausurements did decrease while she was on the 3rd round of antibiotics but was increase this date and fibrosis in volar foreram     Time  6    Period  Weeks    Status  New    Target Date  08/16/18            Plan - 08/11/18 1039    Clinical Impression Statement  Pt fitted this date with her [employer] soft sleeve and glove - appear to be great fit - did contact Rep about pump and it will be fitted today - pt ed on using pump in the AM and PM for 45 min -60 min - and then wear sleeve and glove during day -and will reassess in week     Occupational performance deficits (Please refer to evaluation for details):  ADL's    Rehab Potential  Good    OT Frequency  1x / week    OT Duration  2 weeks    OT Treatment/Interventions  Self-care/ADL training;Manual lymph drainage;Patient/family education;Compression bandaging;Therapeutic exercise;Manual Therapy    Plan  assess circumference of LUE with her custom sleeve and glove     Clinical Decision Making  Limited treatment options, no task modification necessary    OT Home Exercise Plan  see pt instruction    Consulted and Agree with Plan of Care  Patient       Patient will benefit from skilled therapeutic intervention in order to improve the following deficits and impairments:  Increased edema, Pain, Decreased knowledge of precautions  Visit  Diagnosis: Postmastectomy lymphedema syndrome  Problem List Patient Active Problem List   Diagnosis Date Noted  . H/O total mastectomy of left breast 06/03/2018  . Obesity (BMI 30.0-34.9) 03/08/2018  . Insomnia 09/26/2017  . Chronic renal disease, stage III ([place]) 08/09/2017  . Hypomagnesemia 05/04/2017  . Aortic atherosclerosis ([place]) 05/04/2017  . Compression fracture of first lumbar vertebra ([place]) 05/04/2017  . Degenerative disc disease, lumbar 05/04/2017  . Anemia 02/01/2017  . Atherosclerosis of native arteries of extremity with intermittent claudication ([place]) 11/09/2016  . Allergic rhinitis 11/07/2016  . Malignant neoplasm of upper-outer quadrant of left female breast ([place]) 10/08/2016  . Osteoporosis screening 09/18/2016  . Postmenopausal 09/18/2016  . Ache in joint 03/09/2016  . Medication monitoring encounter 10/30/2015  . GERD without esophagitis 07/01/2012  . Hyperlipidemia LDL goal <100 07/01/2012    Rosalyn Gess OTR/L,CLT 08/11/2018, 12:20 PM  [place] [facility] [address] [place], [address] Phone: [phone]   Fax:  [phone]  Name: Maria Patterson MRN: [id number] Date of Birth: 12/06/1940

## 2018-08-11 NOTE — Patient Instructions (Signed)
Pt to use pump for 45 -60 min at night and am  Then wear compression sleeve and glove during day   If glove fingers to tight - wash and stretch with marker over night

## 2018-08-12 ENCOUNTER — Encounter: Payer: Medicare Other | Admitting: Occupational Therapy

## 2018-08-19 ENCOUNTER — Ambulatory Visit: Payer: Medicare Other | Admitting: Occupational Therapy

## 2018-08-19 DIAGNOSIS — I972 Postmastectomy lymphedema syndrome: Secondary | ICD-10-CM

## 2018-08-19 NOTE — Patient Instructions (Signed)
Rep should call her today - to come out and adjust her pump  And then at night time for her to wear isotoner glove , soft tubigrip on hand to elbow and tubigrip E wrist to upper arm  And do one 8 cm short stretch bandage from hand to upper arm  Until appt with me on Tues

## 2018-08-19 NOTE — Therapy (Signed)
[place] [facility] [address] [place], [address] Phone: [phone]   Fax:  [phone]  Occupational Therapy Treatment/ progress note  Patient Details  Name: Maria Patterson MRN: [id number] Date of Birth: 1940-11-04 Referring Provider (OT): Faythe Casa   Encounter Date: 08/19/2018  OT End of Session - 08/19/18 1013    Visit Number  10    Number of Visits  14    Date for OT Re-Evaluation  09/16/18    OT Start Time  0935    OT Stop Time  1000    OT Time Calculation (min)  25 min    Activity Tolerance  Patient tolerated treatment well    Behavior During Therapy  [facility] for tasks assessed/performed       Past Medical History:  Diagnosis Date  . Anemia   . Breast cancer ([place])   . Breast mass, left 09/22/2016   RECOMMENDATION: Ultrasound-guided core biopsies of masses at left breast 2 o'clock 2 cm from nipple, left breast 2 o'clock 5 cm from nipple, abnormal left axillary lymph nodes.  Marland Kitchen GERD (gastroesophageal reflux disease)   . Hyperglycemia   . Hyperlipidemia   . Obesity   . Postmenopausal     Past Surgical History:  Procedure Laterality Date  . ABDOMINAL HYSTERECTOMY    . AXILLARY SENTINEL NODE BIOPSY Left 04/14/2017   Procedure: AXILLARY SENTINEL NODE BIOPSY;  Surgeon: Olean Ree, MD;  Location: [facility] ORS;  Service: General;  Laterality: Left;  . BREAST BIOPSY Left 09/30/2016   US biopsy of 3 areas + chemo  . BREAST EXCISIONAL BIOPSY Left 04/14/2017   Left breast invasive cancer left mastectomy  . ESOPHAGOGASTRODUODENOSCOPY (EGD) WITH PROPOFOL N/A 12/24/2014   Procedure: ESOPHAGOGASTRODUODENOSCOPY (EGD) WITH PROPOFOL;  Surgeon: Manya Silvas, MD;  Location: [facility] ENDOSCOPY;  Service: Endoscopy;  Laterality: N/A;  . MASTECTOMY Left 04/14/2017   Left breast invasive cancer  . PORTACATH PLACEMENT Right 10/27/2016   Procedure: INSERTION PORT-A-CATH;  Surgeon: Nestor Lewandowsky, MD;  Location: [facility] ORS;   Service: General;  Laterality: Right;  . TONSILECTOMY, ADENOIDECTOMY, BILATERAL MYRINGOTOMY AND TUBES    . TONSILLECTOMY    . TOTAL MASTECTOMY Left 04/14/2017   Procedure: TOTAL MASTECTOMY;  Surgeon: Olean Ree, MD;  Location: [facility] ORS;  Service: General;  Laterality: Left;    There were no vitals filed for this visit.  Subjective Assessment - 08/19/18 1010    Subjective   I done the pump since last Friday in the am and pm for about hour - but something do not feel right and then I wear my sleeve and glove during day - but my arms feels like it is little larger     Patient Stated Goals  Want my arm swelling and infection better    Currently in Pain?  No/denies          LYMPHEDEMA/ONCOLOGY QUESTIONNAIRE - 08/19/18 0936      Left Upper Extremity Lymphedema   10 cm Proximal to Olecranon Process  35 cm    Olecranon Process  28.5 cm    15 cm Proximal to Ulnar Styloid Process  28.8 cm    10 cm Proximal to Ulnar Styloid Process  25 cm    Just Proximal to Ulnar Styloid Process  16.4 cm    Across Hand at [employer]  19 cm       Pt arrive with her custom [employer] El[employer] soft sleeve and glove - that was fitted  on her L UE week ago  The Rep did get out last Friday and fitted her with pump  Pt do report she has been using pump like subscribe and her daytime sleeve and glove  Upon measuring her arm - she did increase in circumference  This OT do not see that usually -did contact Rep -and they will try and get to her out to look at pump as soon as possible   Add this date for pt to use at night time temp compression - using her isotoner glove, soft tubigrip on hand to elbow and tubigrip E from wrist to upper arm - and the one 8 cm short stretch bandage from hand to upper arm - will reasses in 4 days   OT Long Term Goals - 07/05/18 0923      OT LONG TERM GOAL #1   Title  Pt's R UE circumference decrease by more than  1.5 cm in forearm, elbow and upper arm to  and 1 cm at  wrist to get  measured for compression sleeve and glove     Baseline  R UE increase to 3 cm at foreram wrist, 1.1cm, elbow 2.5 cm and upper arm 3.5 cm     Time  3    Period  Weeks    Status  New    Target Date  07/26/18      OT LONG TERM GOAL #2   Title  Assess if pt can maintain measurements in R UE  and decreaes risk for cellulitis with over the counter daytime sleeve only     Baseline  Pt meausurements did decrease while she was on the 3rd round of antibiotics but was increase this date and fibrosis in volar foreram     Time  6    Period  Weeks    Status  New    Target Date  08/16/18            Plan - 08/19/18 1015    Clinical Impression Statement  Pt was fitted with her custom [employer] El[employer] soft sleeve and glove week ago as well as pump for her L arm lymphedema - to be used 45-60 min am and pm - pt return this date for reassessment of circumference  - she showed increase in circumference and report that her pump do not feel rigth - this OT contact the Rep to get out to her as soon as possible to check on pump - and did add this date some light night time compression to wear to decrease her circumference - will reassess her in 4 days     Occupational performance deficits (Please refer to evaluation for details):  ADL's    Rehab Potential  Good    OT Frequency  1x / week    OT Duration  4 weeks    OT Treatment/Interventions  Self-care/ADL training;Manual lymph drainage;Patient/family education;Compression bandaging;Therapeutic exercise;Manual Therapy    Plan  assess circumference of LUE with her custom sleeve and glove /pump - if rep did adjustment    Clinical Decision Making  Limited treatment options, no task modification necessary    OT Home Exercise Plan  see pt instruction    Consulted and Agree with Plan of Care  Patient       Patient will benefit from skilled therapeutic intervention in order to improve the following deficits and impairments:  Increased edema, Pain, Decreased knowledge  of precautions  Visit Diagnosis: Postmastectomy lymphedema syndrome - Plan: Ot plan  of care cert/re-cert    Problem List Patient Active Problem List   Diagnosis Date Noted  . H/O total mastectomy of left breast 06/03/2018  . Obesity (BMI 30.0-34.9) 03/08/2018  . Insomnia 09/26/2017  . Chronic renal disease, stage III ([place]) 08/09/2017  . Hypomagnesemia 05/04/2017  . Aortic atherosclerosis ([place]) 05/04/2017  . Compression fracture of first lumbar vertebra ([place]) 05/04/2017  . Degenerative disc disease, lumbar 05/04/2017  . Anemia 02/01/2017  . Atherosclerosis of native arteries of extremity with intermittent claudication ([place]) 11/09/2016  . Allergic rhinitis 11/07/2016  . Malignant neoplasm of upper-outer quadrant of left female breast ([place]) 10/08/2016  . Osteoporosis screening 09/18/2016  . Postmenopausal 09/18/2016  . Ache in joint 03/09/2016  . Medication monitoring encounter 10/30/2015  . GERD without esophagitis 07/01/2012  . Hyperlipidemia LDL goal <100 07/01/2012    Rosalyn Gess OTR/l,CLT 08/19/2018, 10:48 AM  [place] [facility] [address], [place], [address] Phone: [phone]   Fax:  [phone]  Name: Maria Patterson MRN: [id number] Date of Birth: 30-Mar-1941

## 2018-08-23 ENCOUNTER — Ambulatory Visit: Payer: Medicare Other | Attending: Oncology | Admitting: Occupational Therapy

## 2018-08-23 ENCOUNTER — Encounter: Payer: Medicare Other | Admitting: Occupational Therapy

## 2018-08-23 DIAGNOSIS — I972 Postmastectomy lymphedema syndrome: Secondary | ICD-10-CM | POA: Insufficient documentation

## 2018-08-23 NOTE — Therapy (Signed)
[place] [facility] [address] [place], [address] Phone: [phone]   Fax:  [phone]  Occupational Therapy Treatment  Patient Details  Name: Maria Patterson MRN: [id number] Date of Birth: 28-Jul-1940 Referring Provider (OT): Faythe Casa   Encounter Date: 08/23/2018  OT End of Session - 08/23/18 1536    Visit Number  11    Number of Visits  14    Date for OT Re-Evaluation  09/16/18    OT Start Time  1215    OT Stop Time  1240    OT Time Calculation (min)  25 min    Activity Tolerance  Patient tolerated treatment well    Behavior During Therapy  [facility] for tasks assessed/performed       Past Medical History:  Diagnosis Date  . Anemia   . Breast cancer ([place])   . Breast mass, left 09/22/2016   RECOMMENDATION: Ultrasound-guided core biopsies of masses at left breast 2 o'clock 2 cm from nipple, left breast 2 o'clock 5 cm from nipple, abnormal left axillary lymph nodes.  Marland Kitchen GERD (gastroesophageal reflux disease)   . Hyperglycemia   . Hyperlipidemia   . Obesity   . Postmenopausal     Past Surgical History:  Procedure Laterality Date  . ABDOMINAL HYSTERECTOMY    . AXILLARY SENTINEL NODE BIOPSY Left 04/14/2017   Procedure: AXILLARY SENTINEL NODE BIOPSY;  Surgeon: Olean Ree, MD;  Location: [facility] ORS;  Service: General;  Laterality: Left;  . BREAST BIOPSY Left 09/30/2016   US biopsy of 3 areas + chemo  . BREAST EXCISIONAL BIOPSY Left 04/14/2017   Left breast invasive cancer left mastectomy  . ESOPHAGOGASTRODUODENOSCOPY (EGD) WITH PROPOFOL N/A 12/24/2014   Procedure: ESOPHAGOGASTRODUODENOSCOPY (EGD) WITH PROPOFOL;  Surgeon: Manya Silvas, MD;  Location: [facility] ENDOSCOPY;  Service: Endoscopy;  Laterality: N/A;  . MASTECTOMY Left 04/14/2017   Left breast invasive cancer  . PORTACATH PLACEMENT Right 10/27/2016   Procedure: INSERTION PORT-A-CATH;  Surgeon: Nestor Lewandowsky, MD;  Location: [facility] ORS;  Service: General;   Laterality: Right;  . TONSILECTOMY, ADENOIDECTOMY, BILATERAL MYRINGOTOMY AND TUBES    . TONSILLECTOMY    . TOTAL MASTECTOMY Left 04/14/2017   Procedure: TOTAL MASTECTOMY;  Surgeon: Olean Ree, MD;  Location: [facility] ORS;  Service: General;  Laterality: Left;    There were no vitals filed for this visit.  Subjective Assessment - 08/23/18 1534    Subjective   MY forearm feels about the same - they come to adjust my pump and lowered the pressure - but now I don't feel it pump on my forearm - and am so tired of all of this - my body ache - feels like my other shoudler and knees - as well as feels like swelling in my legs too     Patient Stated Goals  Want my arm swelling and infection better    Currently in Pain?  No/denies          LYMPHEDEMA/ONCOLOGY QUESTIONNAIRE - 08/23/18 1217      Left Upper Extremity Lymphedema   15 cm Proximal to Olecranon Process  33.7 cm    10 cm Proximal to Olecranon Process  34.5 cm    Olecranon Process  28 cm    15 cm Proximal to Ulnar Styloid Process  29.7 cm    10 cm Proximal to Ulnar Styloid Process  25.5 cm    Just Proximal to Ulnar Styloid Process  17.4 cm    Across Hand  at [employer]  19 cm        Pt arrive with her custom [employer] El[employer] soft sleeve and glove - that was fitted on her L UE 2 weeks ago  The Rep did get out 2  Fridays and fitted her with pump , last Friday went out to decrease pressure - per pt is was hurting her  And now pt report she do not feel pump on her forearm pump - contacted Rep  They will go out and do pre and post measurements with pump   Pt do report she has been using pump like subscribe and her daytime sleeve and glove  Upon measuring her arm - she did increase in circumference again in there forearm -   This OT do not see that usually -depending on pre and post measurements - will decide if she needs night time compression sleeve   Pt to cont night time temp compression - using her isotoner glove, soft tubigrip  on hand to elbow and tubigrip E from wrist to upper arm - and the one 8 cm short stretch bandage from hand to upper arm - will reasses in week again                OT Education - 08/23/18 1535    Education Details  use of pump in combination with her daytime compression sleeve - and night time compression - Rep will come check on pump again     Person(s) Educated  Patient    Methods  Explanation;Demonstration;Handout    Comprehension  Verbalized understanding;Returned demonstration          OT Long Term Goals - 07/05/18 0923      OT LONG TERM GOAL #1   Title  Pt's R UE circumference decrease by more than  1.5 cm in forearm, elbow and upper arm to  and 1 cm at  wrist to get measured for compression sleeve and glove     Baseline  R UE increase to 3 cm at foreram wrist, 1.1cm, elbow 2.5 cm and upper arm 3.5 cm     Time  3    Period  Weeks    Status  New    Target Date  07/26/18      OT LONG TERM GOAL #2   Title  Assess if pt can maintain measurements in R UE  and decreaes risk for cellulitis with over the counter daytime sleeve only     Baseline  Pt meausurements did decrease while she was on the 3rd round of antibiotics but was increase this date and fibrosis in volar foreram     Time  6    Period  Weeks    Status  New    Target Date  08/16/18            Plan - 08/23/18 1536    Clinical Impression Statement  Pt wearing custom [employer] soft sleeve and glove during day and do pump a hour in the am and pm - but with measurements this week again her forearm is increasing - usually with compression garment and pump - it will maintain her lymphedema - contact the REp again and they will go and take pre and post measurements on forearm - because of pt reporting she do not feel pump on the forearm -will reassess in week again -if still issues with circumference in forearm increasing - will look into night time compression sleeve  Occupational performance deficits  (Please refer to evaluation for details):  ADL's    Rehab Potential  Good    OT Frequency  1x / week    OT Duration  4 weeks    OT Treatment/Interventions  Self-care/ADL training;Manual lymph drainage;Patient/family education;Compression bandaging;Therapeutic exercise;Manual Therapy    Plan  assess circumference of LUE with her custom sleeve and glove /pump - rep will do pre and post measurements     Clinical Decision Making  Limited treatment options, no task modification necessary    OT Home Exercise Plan  see pt instruction    Consulted and Agree with Plan of Care  Patient       Patient will benefit from skilled therapeutic intervention in order to improve the following deficits and impairments:  Increased edema, Pain, Decreased knowledge of precautions  Visit Diagnosis: Postmastectomy lymphedema syndrome    Problem List Patient Active Problem List   Diagnosis Date Noted  . H/O total mastectomy of left breast 06/03/2018  . Obesity (BMI 30.0-34.9) 03/08/2018  . Insomnia 09/26/2017  . Chronic renal disease, stage III ([place]) 08/09/2017  . Hypomagnesemia 05/04/2017  . Aortic atherosclerosis ([place]) 05/04/2017  . Compression fracture of first lumbar vertebra ([place]) 05/04/2017  . Degenerative disc disease, lumbar 05/04/2017  . Anemia 02/01/2017  . Atherosclerosis of native arteries of extremity with intermittent claudication ([place]) 11/09/2016  . Allergic rhinitis 11/07/2016  . Malignant neoplasm of upper-outer quadrant of left female breast ([place]) 10/08/2016  . Osteoporosis screening 09/18/2016  . Postmenopausal 09/18/2016  . Ache in joint 03/09/2016  . Medication monitoring encounter 10/30/2015  . GERD without esophagitis 07/01/2012  . Hyperlipidemia LDL goal <100 07/01/2012    Rosalyn Gess OTR/L,CLT 08/23/2018, 3:41 PM  [place] [facility] [address], [place], [address] Phone: [phone]   Fax:   [phone]  Name: Maria Patterson MRN: [id number] Date of Birth: [birth year]

## 2018-08-30 ENCOUNTER — Ambulatory Visit: Payer: Medicare Other | Admitting: Occupational Therapy

## 2018-08-30 DIAGNOSIS — I972 Postmastectomy lymphedema syndrome: Secondary | ICD-10-CM

## 2018-08-30 NOTE — Patient Instructions (Signed)
Pt to wear her sleeve with glove during day  And pump in the am and pm for hour  Will reassess her in 3 wks

## 2018-08-30 NOTE — Therapy (Signed)
[facility] [address] [place], [address] Phone: [phone]   Fax:  [phone]  Occupational Therapy Treatment  Patient Details  Name: Maria Patterson MRN: [id number] Date of Birth: [birth year] Referring Provider (OT): Faythe Casa   Encounter Date: 08/30/2018  OT End of Session - 08/30/18 1234    Visit Number  12    Number of Visits  14    Date for OT Re-Evaluation  09/16/18    OT Start Time  1210    OT Stop Time  1234    OT Time Calculation (min)  24 min       Past Medical History:  Diagnosis Date  . Anemia   . Breast cancer ([place])   . Breast mass, left 09/22/2016   RECOMMENDATION: Ultrasound-guided core biopsies of masses at left breast 2 o'clock 2 cm from nipple, left breast 2 o'clock 5 cm from nipple, abnormal left axillary lymph nodes.  Marland Kitchen GERD (gastroesophageal reflux disease)   . Hyperglycemia   . Hyperlipidemia   . Obesity   . Postmenopausal     Past Surgical History:  Procedure Laterality Date  . ABDOMINAL HYSTERECTOMY    . AXILLARY SENTINEL NODE BIOPSY Left 04/14/2017   Procedure: AXILLARY SENTINEL NODE BIOPSY;  Surgeon: Olean Ree, MD;  Location: [facility] ORS;  Service: General;  Laterality: Left;  . BREAST BIOPSY Left 09/30/2016   US biopsy of 3 areas + chemo  . BREAST EXCISIONAL BIOPSY Left 04/14/2017   Left breast invasive cancer left mastectomy  . ESOPHAGOGASTRODUODENOSCOPY (EGD) WITH PROPOFOL N/A 12/24/2014   Procedure: ESOPHAGOGASTRODUODENOSCOPY (EGD) WITH PROPOFOL;  Surgeon: Manya Silvas, MD;  Location: [facility] END[facility]COPY;  Service: Endoscopy;  Laterality: N/A;  . MASTECTOMY Left 04/14/2017   Left breast invasive cancer  . PORTACATH PLACEMENT Right 10/27/2016   Procedure: INSERTION PORT-A-CATH;  Surgeon: Nestor Lewandowsky, MD;  Location: [facility] ORS;  Service: General;  Laterality: Right;  . TONSILECTOMY, ADENOIDECTOMY, BILATERAL MYRINGOTOMY AND TUBES    . TONSILLECTOMY    . TOTAL  MASTECTOMY Left 04/14/2017   Procedure: TOTAL MASTECTOMY;  Surgeon: Olean Ree, MD;  Location: [facility] ORS;  Service: General;  Laterality: Left;    There were no vitals filed for this visit.  Subjective Assessment - 08/30/18 1233    Subjective   I am so sick and tired of looking at my arm and doing this - they did come and check on my pump again and measure before and afterwards     Patient Stated Goals  Want my arm swelling and infection better    Currently in Pain?  No/denies      measurements taken see flowsheet     LYMPHEDEMA/ONCOLOGY QUESTIONNAIRE - 08/30/18 1212      Left Upper Extremity Lymphedema   15 cm Proximal to Olecranon Process  33 cm    10 cm Proximal to Olecranon Process  35 cm    Olecranon Process  28 cm    15 cm Proximal to Ulnar Styloid Process  29 cm    10 cm Proximal to Ulnar Styloid Process  25.5 cm    Just Proximal to Ulnar Styloid Process  16.6 cm    Across Hand at [employer]  19.4 cm      Pt measurement did not increase = the first time in the last to sessions- this OT did had the Reps out at her house again - doing pre and post measurements after using pump for 45  min  Pt was last time reporting she did not feel the pump on her forearm -and was reporting she is following the home program  Pt to cont doing pump 2 x day 60 min - am and pm  And using custom Jobst Elvarex soft sleeve and glove- and will reassess in 3 wks if she decreased some more    Discuss in length with pt about lymphedema - anatomy , treatments - and side affect of CA treatment -and pt to stay positive - stress management, ect ....             OT Education - 08/30/18 1234    Education Details  progress - and HEP - lymphedema     Person(s) Educated  Patient    Methods  Explanation;Demonstration;Handout    Comprehension  Verbalized understanding;Returned demonstration          OT Long Term Goals - 07/05/18 0923      OT LONG TERM GOAL #1   Title  Pt's R UE  circumference decrease by more than  1.5 cm in forearm, elbow and upper arm to  and 1 cm at  wrist to get measured for compression sleeve and glove     Baseline  R UE increase to 3 cm at foreram wrist, 1.1cm, elbow 2.5 cm and upper arm 3.5 cm     Time  3    Period  Weeks    Status  New    Target Date  07/26/18      OT LONG TERM GOAL #2   Title  Assess if pt can maintain measurements in R UE  and decreaes risk for cellulitis with over the counter daytime sleeve only     Baseline  Pt meausurements did decrease while she was on the 3rd round of antibiotics but was increase this date and fibrosis in volar foreram     Time  6    Period  Weeks    Status  New    Target Date  08/16/18            Plan - 08/30/18 1627    Clinical Impression Statement  This OT had the pump Reps go out to her house and did pre and post assessment for her circumference of her L arm done wiht using her pump - pt forearm measurements increase with use of pump 2 x day for 60 min and using her custom sleeve Job[employer] Elvarex soft and glove - today after using pump since the pre and post assessment - her measurements stayed the same - pt ed again on lymphedema,  and about being compliant to decrease risk for cellulitis , as well as she needs to stay positive and use pump for 3 more wks and will reassess if measurments decrease some or if need night time compression garment     Occupational performance deficits (Please refer to evaluation for details):  ADL's    Rehab Potential  Good    OT Frequency  Biweekly    OT Duration  4 weeks    OT Treatment/Interventions  Self-care/ADL training;Manual lymph drainage;Patient/family education;Compression bandaging;Therapeutic exercise;Manual Therapy    Plan  assess circumference of LUE with her custom sleeve and glove /pump - after using it 3 wks     Clinical Decision Making  Limited treatment options, no task modification necessary    OT Home Exercise Plan  see pt instruction     Consulted and Agree with Plan of Care  Patient  Patient will benefit from skilled therapeutic intervention in order to improve the following deficits and impairments:  Increased edema, Pain, Decreased knowledge of precautions  Visit Diagnosis: Postmastectomy lymphedema syndrome    Problem List Patient Active Problem List   Diagnosis Date Noted  . H/O total mastectomy of left breast 06/03/2018  . Obesity (BMI 30.0-34.9) 03/08/2018  . Insomnia 09/26/2017  . Chronic renal disease, stage III ([place]) 08/09/2017  . Hypomagnesemia 05/04/2017  . Aortic atherosclerosis ([place]) 05/04/2017  . Compression fracture of first lumbar vertebra ([place]) 05/04/2017  . Degenerative disc disease, lumbar 05/04/2017  . Anemia 02/01/2017  . Atherosclerosis of native arteries of extremity with intermittent claudication ([place]) 11/09/2016  . Allergic rhinitis 11/07/2016  . Malignant neoplasm of upper-outer quadrant of left female breast ([place]) 10/08/2016  . Osteoporosis screening 09/18/2016  . Postmenopausal 09/18/2016  . Ache in joint 03/09/2016  . Medication monitoring encounter 10/30/2015  . GERD without esophagitis 07/01/2012  . Hyperlipidemia LDL goal <100 07/01/2012    Rosalyn Gess OTR/L,CLT 08/30/2018, 4:32 PM  [facility] [address], [place], [address] Phone: [phone]   Fax:  [phone]  Name: Maria Patterson MRN: [id number] Date of Birth: [birth year]

## 2018-09-07 ENCOUNTER — Encounter: Payer: Self-pay | Admitting: Radiation Oncology

## 2018-09-07 ENCOUNTER — Ambulatory Visit
Admission: RE | Admit: 2018-09-07 | Discharge: 2018-09-07 | Disposition: A | Discharge status: Home / Self Care | Payer: Medicare Other | Source: Ambulatory Visit | Attending: Radiation Oncology | Admitting: Radiation Oncology

## 2018-09-07 ENCOUNTER — Other Ambulatory Visit: Payer: Self-pay | Admitting: *Deleted

## 2018-09-07 ENCOUNTER — Other Ambulatory Visit: Payer: Self-pay

## 2018-09-07 VITALS — BP 140/93 | HR 87 | Temp 97.7°F | Resp 18 | Wt 173.5 lb

## 2018-09-07 DIAGNOSIS — Z923 Personal history of irradiation: Secondary | ICD-10-CM | POA: Insufficient documentation

## 2018-09-07 DIAGNOSIS — Z171 Estrogen receptor negative status [ER-]: Principal | ICD-10-CM

## 2018-09-07 DIAGNOSIS — C50412 Malignant neoplasm of upper-outer quadrant of left female breast: Secondary | ICD-10-CM | POA: Insufficient documentation

## 2018-09-07 DIAGNOSIS — R5383 Other fatigue: Secondary | ICD-10-CM | POA: Insufficient documentation

## 2018-09-07 DIAGNOSIS — I890 Lymphedema, not elsewhere classified: Secondary | ICD-10-CM | POA: Diagnosis not present

## 2018-09-07 DIAGNOSIS — N6320 Unspecified lump in the left breast, unspecified quadrant: Secondary | ICD-10-CM | POA: Diagnosis not present

## 2018-09-07 NOTE — Progress Notes (Signed)
Radiation Oncology Follow up Note  Name: Maria Patterson   Date:   09/07/2018 MRN:  [id number] DOB: April 27, 1941    This 78 y.o. 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 He's had a mammogram of her right breast back in March 2019which was BI-RADS 1 negative.based on ER/PR negative status for tumor she is not on antiestrogen therapy.  COMPLICATIONS OF TREATMENT: none  FOLLOW UP COMPLIANCE: keeps appointments   PHYSICAL EXAM:  BP (!) 140/93 (BP Location: Right Arm, Patient Position: Sitting)   Pulse 87   Temp 97.7 F (36.5 C) (Tympanic)   Resp 18   Wt 173 lb 8 oz (78.7 kg)   BMI 32.78 kg/m  The left mastectomy scar has several dense nodular densities present which I do not appreciate a prior examination. She is asleep on her left upper extremity. No dominant mass or nodularity is noted in the right breast in 2 positions examined. No axillary or supraclavicular adenopathy is appreciated.Well-developed well-nourished patient in NAD. HEENT reveals PERLA, EOMI, discs not visualized.  Oral cavity is clear. No oral mucosal lesions are identified. Neck is clear without evidence of cervical or supraclavicular adenopathy. Lungs are clear to A&P. Cardiac examination is essentially unremarkable with regular rate and rhythm without murmur rub or thrill. Abdomen is benign with no organomegaly or masses noted. Motor sensory and DTR levels are equal  and symmetric in the upper and lower extremities. Cranial nerves II through XII are grossly intact. Proprioception is intact. No peripheral adenopathy or edema is identified. No motor or sensory levels are noted. Crude visual fields are within normal range.  RADIOLOGY RESULTS: prior CT scan mammograms are reviewed  PLAN: this time I'm concerned about these nodular densities in her mastectomy scar I've ordered a CT scan with contrast of her chest for clarification. I've asked to see her back shortly after that to review her findings. Should these be worse and will order a biopsy probably by her surgeon. Patient comprehends my assessment.  I would like to take this opportunity to thank you for allowing me to participate in the care of your patient.Noreene Filbert, MD

## 2018-09-08 ENCOUNTER — Ambulatory Visit: Payer: Medicare Other | Admitting: Family Medicine

## 2018-09-12 IMAGING — CT CT ABD-PELV W/ CM
2 of 5 series · 16 of 46 positions shown, 18 images · IV contrast (iopamidol)
Comparison: 03/17/2017.

CLINICAL DATA: Right lower quadrant pain.

EXAM:
CT ABDOMEN AND PELVIS WITH CONTRAST
TECHNIQUE: Multidetector CT imaging of the abdomen and pelvis was performed
using the standard protocol following bolus administration of
intravenous contrast.
CONTRAST:  75mL 2HAEHZ-SEE IOPAMIDOL (2HAEHZ-SEE) INJECTION 61%

[Series 2: routine abd/pel with · axial · 0.66mm/px · z∈[-936,-526]mm · 13 of 92 slices shown, 15 images]
[im 5/92  soft-tissue]
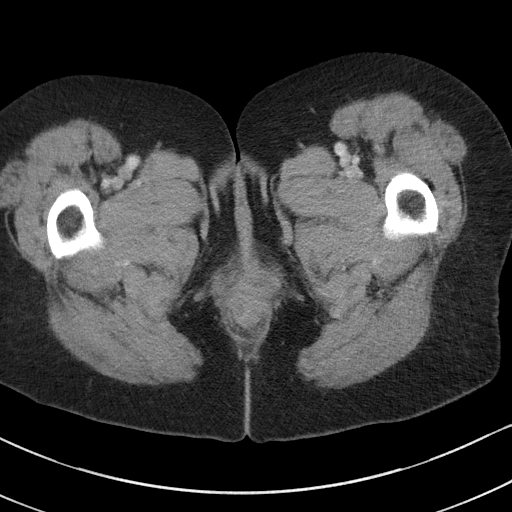
[im 5/92  bone]
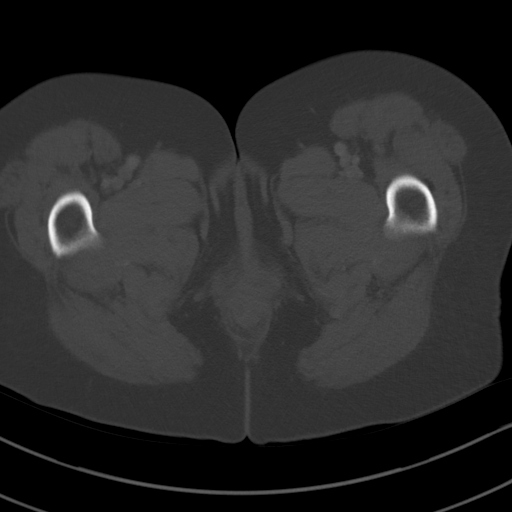
[im 15/92  soft-tissue]
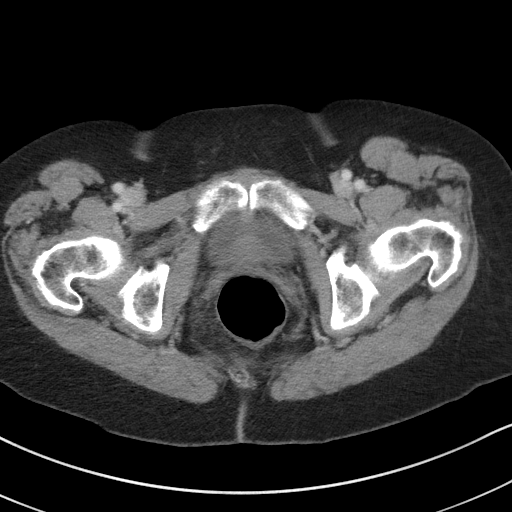
[im 20/92  soft-tissue]
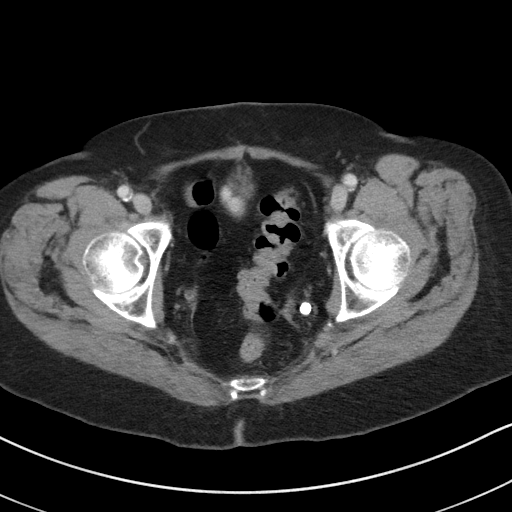
[im 24/92  soft-tissue]
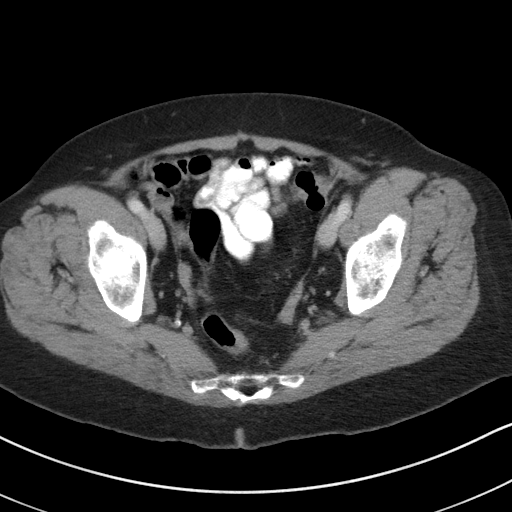
[im 34/92  soft-tissue]
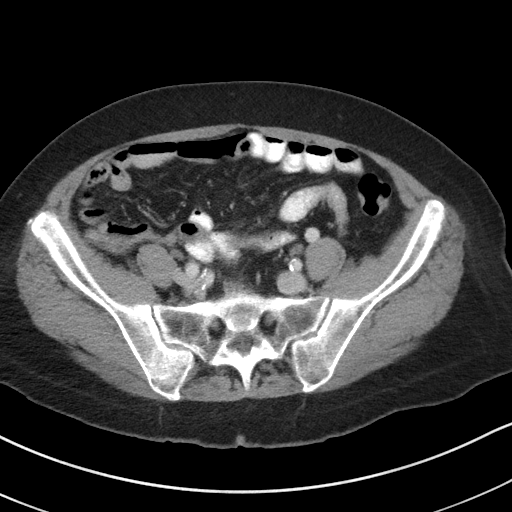
[im 39/92  soft-tissue]
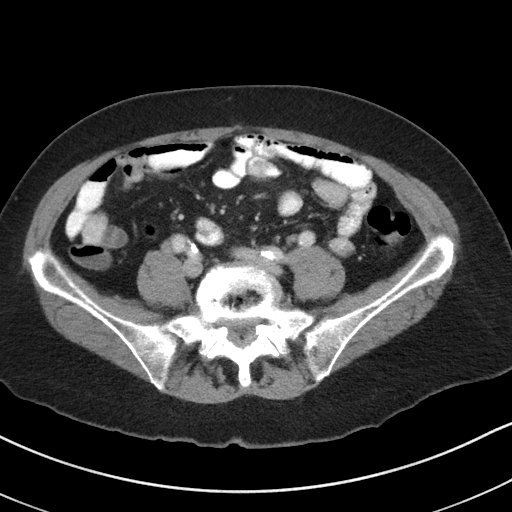
[im 48/92  soft-tissue]
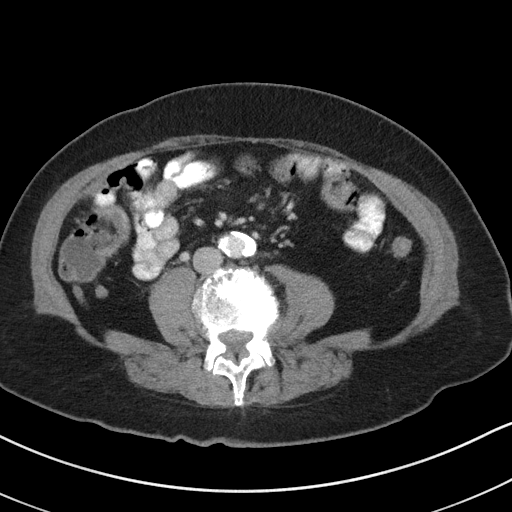
[im 53/92  soft-tissue]
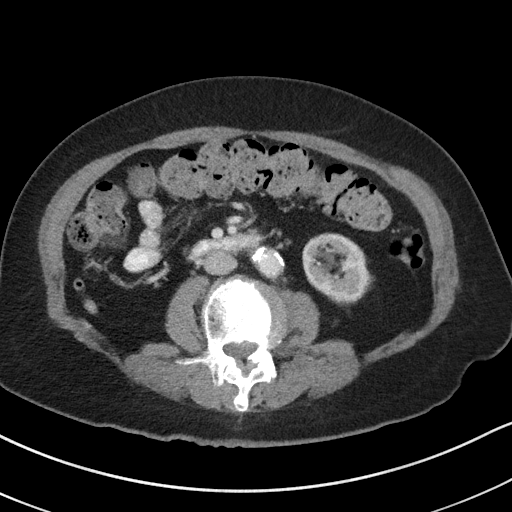
[im 58/92  soft-tissue]
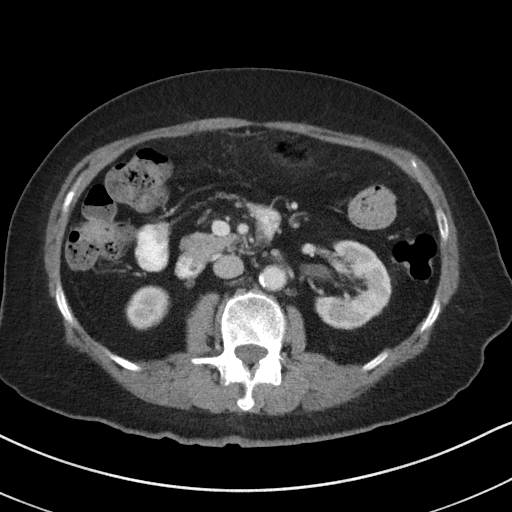
[im 58/92  bone]
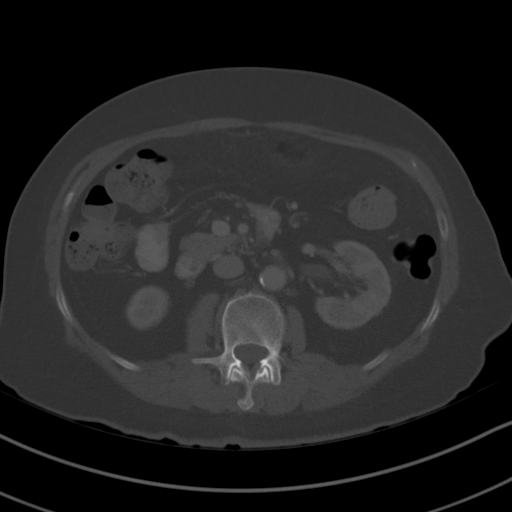
[im 68/92  soft-tissue]
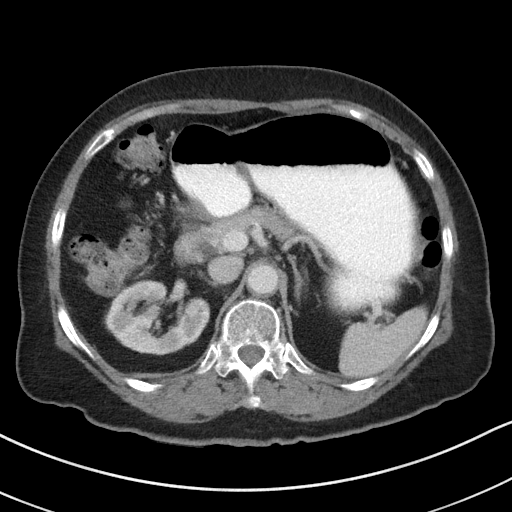
[im 72/92  soft-tissue]
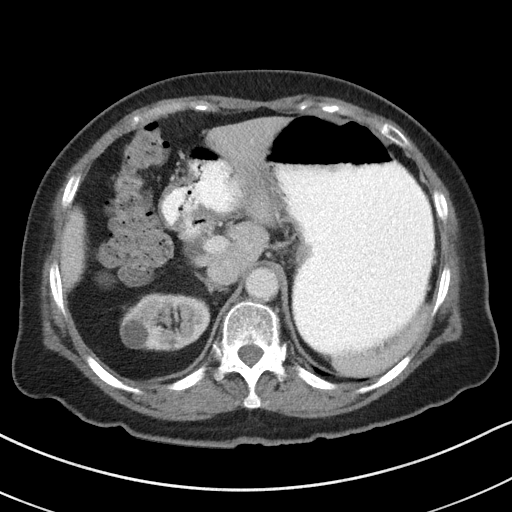
[im 77/92  soft-tissue]
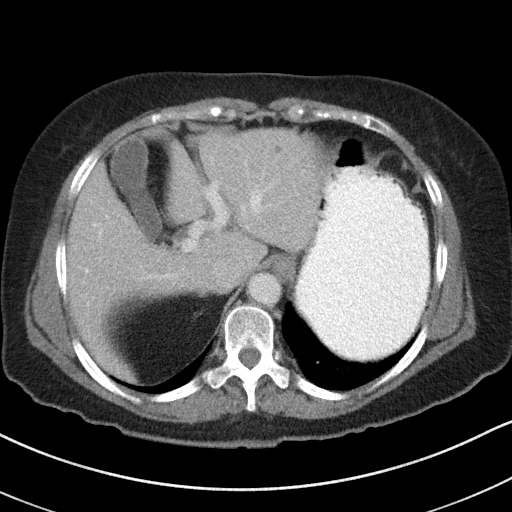
[im 87/92  soft-tissue]
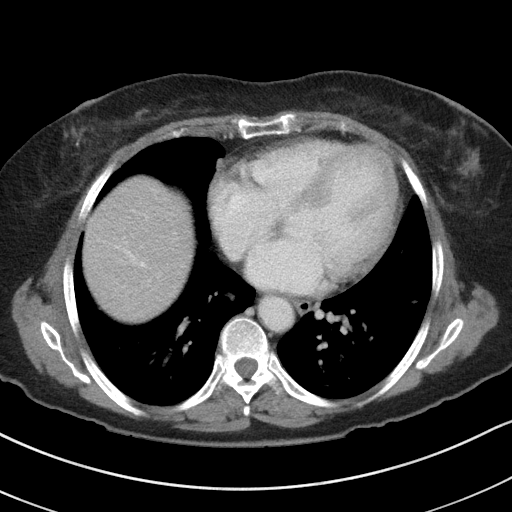

[Series 6: coronal st · coronal · 0.72mm/px · 3 of 82 slices shown]
[im 28/82  soft-tissue]
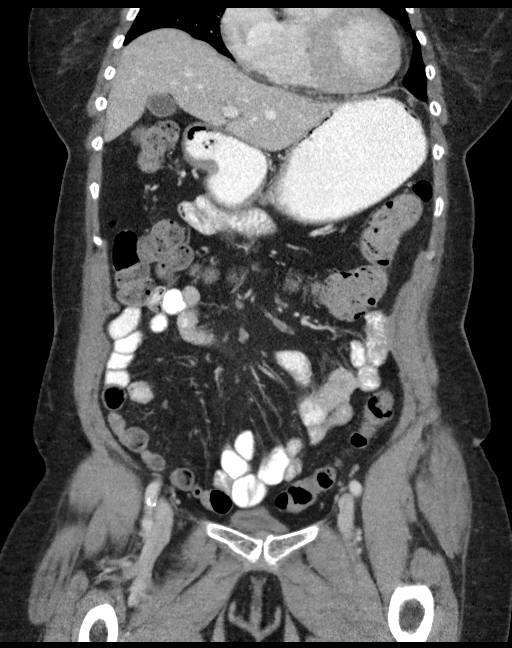
[im 37/82  soft-tissue]
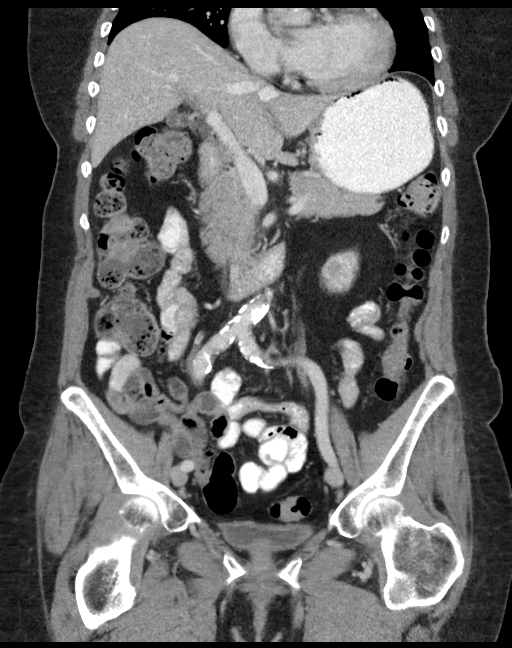
[im 46/82  soft-tissue]
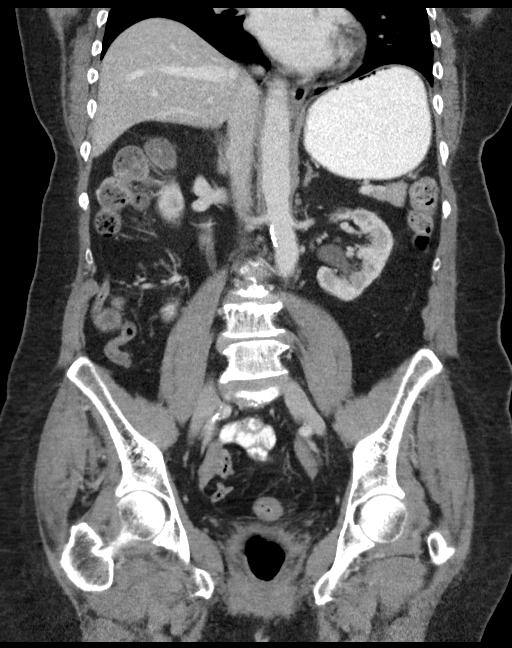

[16 of 46 positions shown; findings below may reference images not displayed]

FINDINGS: Lower chest: No acute abnormality.

Hepatobiliary: Small low-density structure in the left lobe of liver
measures 4 mm. The gallbladder appears normal. No biliary
dilatation.

Pancreas: Unremarkable. No pancreatic ductal dilatation or
surrounding inflammatory changes.

Spleen: Normal in size without focal abnormality.

Adrenals/Urinary Tract: Normal adrenal glands. Bilateral kidney
cysts. No mass or hydronephrosis. The urinary bladder appears
normal.

Stomach/Bowel: Stomach is within normal limits. Appendix appears
normal. No evidence of bowel wall thickening, distention, or
inflammatory changes.

Vascular/Lymphatic: Aortic atherosclerosis. No enlarged abdominal or
pelvic lymph nodes. No inguinal adenopathy.

Reproductive: Status post hysterectomy. No adnexal masses.

Other: There is a small fat containing umbilical hernia. No free
fluid or fluid collections within the abdomen or pelvis.

Musculoskeletal: Advanced degenerative disc disease within the
lumbar spine. There is a compression deformity involving scratch set
chronic compression deformity and the the L1 vertebra.
IMPRESSION: 1. No acute findings within the abdomen or pelvis. The appendix is
visualized and appears normal.
2.  Aortic Atherosclerosis ([id number]).

## 2018-09-19 ENCOUNTER — Ambulatory Visit
Admission: RE | Admit: 2018-09-19 | Discharge: 2018-09-19 | Disposition: A | Discharge status: Home / Self Care | Payer: Medicare Other | Source: Ambulatory Visit | Attending: Radiation Oncology | Admitting: Radiation Oncology

## 2018-09-19 ENCOUNTER — Other Ambulatory Visit: Payer: Self-pay

## 2018-09-19 DIAGNOSIS — C50412 Malignant neoplasm of upper-outer quadrant of left female breast: Secondary | ICD-10-CM | POA: Insufficient documentation

## 2018-09-19 DIAGNOSIS — Z171 Estrogen receptor negative status [ER-]: Secondary | ICD-10-CM | POA: Diagnosis not present

## 2018-09-19 DIAGNOSIS — J9811 Atelectasis: Secondary | ICD-10-CM | POA: Diagnosis not present

## 2018-09-19 LAB — POCT I-STAT CREATININE: Creatinine, Ser: 1.2 mg/dL — ABNORMAL HIGH (ref 0.44–1.00)

## 2018-09-19 MED ORDER — IOHEXOL 300 MG/ML  SOLN
75.0000 mL | Freq: Once | INTRAMUSCULAR | Status: AC | PRN
  Administered 2018-09-19: 75 mL via INTRAVENOUS

## 2018-09-20 ENCOUNTER — Ambulatory Visit: Payer: Medicare Other | Attending: Physician Assistant | Admitting: Occupational Therapy

## 2018-09-20 DIAGNOSIS — I972 Postmastectomy lymphedema syndrome: Secondary | ICD-10-CM | POA: Diagnosis not present

## 2018-09-20 NOTE — Therapy (Signed)
[facility] [address], [place], [address] Phone: [phone]   Fax:  [phone]  Occupational Therapy Treatment  Patient Details  Name: Maria Patterson MRN: [id number] Date of Birth: [birth year] Referring Provider (OT): Faythe Casa   Encounter Date: 09/20/2018  OT End of Session - 09/20/18 1153    Visit Number  13    Number of Visits  17    Date for OT Re-Evaluation  10/18/18    OT Start Time  1115    OT Stop Time  1145    OT Time Calculation (min)  30 min    Activity Tolerance  Patient tolerated treatment well    Behavior During Therapy  [facility] for tasks assessed/performed       Past Medical History:  Diagnosis Date  . Anemia   . Breast cancer ([place])   . Breast mass, left 09/22/2016   RECOMMENDATION: Ultrasound-guided core biopsies of masses at left breast 2 o'clock 2 cm from nipple, left breast 2 o'clock 5 cm from nipple, abnormal left axillary lymph nodes.  Marland Kitchen GERD (gastroesophageal reflux disease)   . Hyperglycemia   . Hyperlipidemia   . Obesity   . Postmenopausal     Past Surgical History:  Procedure Laterality Date  . ABDOMINAL HYSTERECTOMY    . AXILLARY SENTINEL NODE BIOPSY Left 04/14/2017   Procedure: AXILLARY SENTINEL NODE BIOPSY;  Surgeon: Olean Ree, MD;  Location: [facility] ORS;  Service: General;  Laterality: Left;  . BREAST BIOPSY Left 09/30/2016   US biopsy of 3 areas + chemo  . BREAST EXCISIONAL BIOPSY Left 04/14/2017   Left breast invasive cancer left mastectomy  . ESOPHAGOGASTRODUODENOSCOPY (EGD) WITH PROPOFOL N/A 12/24/2014   Procedure: ESOPHAGOGASTRODUODENOSCOPY (EGD) WITH PROPOFOL;  Surgeon: Manya Silvas, MD;  Location: [facility] ENDOSCOPY;  Service: Endoscopy;  Laterality: N/A;  . MASTECTOMY Left 04/14/2017   Left breast invasive cancer  . PORTACATH PLACEMENT Right 10/27/2016   Procedure: INSERTION PORT-A-CATH;  Surgeon: Nestor Lewandowsky, MD;  Location: [facility] ORS;  Service: General;   Laterality: Right;  . TONSILECTOMY, ADENOIDECTOMY, BILATERAL MYRINGOTOMY AND TUBES    . TONSILLECTOMY    . TOTAL MASTECTOMY Left 04/14/2017   Procedure: TOTAL MASTECTOMY;  Surgeon: Olean Ree, MD;  Location: [facility] ORS;  Service: General;  Laterality: Left;    There were no vitals filed for this visit.  Subjective Assessment - 09/20/18 1150    Subjective   I seen Dr Donella Stade last week and had chest CT - next week mammogram and seeing my family Dr - I doing okay but my forearm is so tight at night time and am - during day it feels soft after I pump it - hand and upper arm doing okay     Patient Stated Goals  Want my arm swelling and infection better    Currently in Pain?  No/denies          LYMPHEDEMA/ONCOLOGY QUESTIONNAIRE - 09/20/18 1116      Left Upper Extremity Lymphedema   15 cm Proximal to Olecranon Process  32 cm    10 cm Proximal to Olecranon Process  34.5 cm    Olecranon Process  28.5 cm    15 cm Proximal to Ulnar Styloid Process  30.4 cm    10 cm Proximal to Ulnar Styloid Process  26.8 cm    Just Proximal to Ulnar Styloid Process  16.6 cm    Across Hand at [employer]  19.5 cm  pt arrive with her custom daytime Jobst elvarex soft sleeve and glove -she is wearing it the whole day per pt and using her pump am and pm for 45 min to hour  But at night time she do increase in circumference - mostly forearm - where she had her first flareup - months ago with cellulitis episodes  Pt arrive this date and forearm increase by 1.2 cm compare to 3 wks ago   applied loaner Reidsleeve [employer] for 10 min - and forearm decrease by 0.4 cm - and fibrotic tissues got much softer   pt to use loaner this week  And OT will contact DME company about ordering night time garment - [employer] or Reidsleeve with powersleeve  Pt to follow up with me in week after using loaner night time sleeve in combination with day sleeve and pump                  OT Education - 09/20/18  1152    Education Details  results of reassessment - and POC to get night time compression     Person(s) Educated  Patient    Methods  Explanation;Demonstration;Handout    Comprehension  Verbalized understanding;Returned demonstration          OT Long Term Goals - 09/20/18 1157      OT LONG TERM GOAL #1   Title  Pt's R UE circumference decrease by more than  1.5 cm in forearm, elbow and upper arm to  and 1 cm at  wrist to get measured for compression sleeve and glove     Status  Achieved      OT LONG TERM GOAL #2   Title  Assess if pt can maintain measurements in R UE  and decreaes risk for cellulitis with over the counter daytime sleeve only     Baseline  Pt unable to maintain circumference with daysleeve custom;/glove and pump - pt in need for night time compression garment     Time  4    Period  Weeks    Status  On-going    Target Date  10/18/18            Plan - 09/20/18 1154    Clinical Impression Statement  Pt was seen 3 wks ago - using at home her pump for L UE lymphedema in am and pm for about 45 min -and then daytime her [employer] el[employer]x soft sleeve with glove-  but appear her stage 2 lymphedema do want to increase night time -and pt in need for night time compression sleeve to decrease her risk for infection - pt did increase in forearm the last 3 wks 1.4 cm in circumference - done in clinic demo for 10 min using night time sleeve and pt decrease 0.4 cm and fibrotic tissue was softer - OT will contact [employer] to get with pt to order night time garment and precription from MD     Occupational performance deficits (Please refer to evaluation for details):  ADL's    Rehab Potential  Good    Clinical Decision Making  Limited treatment options, no task modification necessary    OT Frequency  1x / week    OT Duration  4 weeks    OT Treatment/Interventions  Self-care/ADL training;Manual lymph drainage;Patient/family education;Compression bandaging;Therapeutic exercise;Manual  Therapy    Plan  assess circumference of LUE with use of loaner Reidsleeve with 8cm bandage at night time -and contact DME company     OT  Home Exercise Plan  see pt instruction    Consulted and Agree with Plan of Care  Patient       Patient will benefit from skilled therapeutic intervention in order to improve the following deficits and impairments:     Visit Diagnosis: Postmastectomy lymphedema syndrome - Plan: Ot plan of care cert/re-cert    Problem List Patient Active Problem List   Diagnosis Date Noted  . H/O total mastectomy of left breast 06/03/2018  . Obesity (BMI 30.0-34.9) 03/08/2018  . Insomnia 09/26/2017  . Chronic renal disease, stage III ([place]) 08/09/2017  . Hypomagnesemia 05/04/2017  . Aortic atherosclerosis ([place]) 05/04/2017  . Compression fracture of first lumbar vertebra ([place]) 05/04/2017  . Degenerative disc disease, lumbar 05/04/2017  . Anemia 02/01/2017  . Atherosclerosis of native arteries of extremity with intermittent claudication ([place]) 11/09/2016  . Allergic rhinitis 11/07/2016  . Malignant neoplasm of upper-outer quadrant of left female breast ([place]) 10/08/2016  . Osteoporosis screening 09/18/2016  . Postmenopausal 09/18/2016  . Ache in joint 03/09/2016  . Medication monitoring encounter 10/30/2015  . GERD without esophagitis 07/01/2012  . Hyperlipidemia LDL goal <100 07/01/2012    Rosalyn Gess OTR/L,CLT 09/20/2018, 12:01 PM  [facility] [address], [place], [address] Phone: [phone]   Fax:  [phone]  Name: Maria Patterson MRN: [id number] Date of Birth: 1941/05/22

## 2018-09-20 NOTE — Patient Instructions (Signed)
Pt to forrow loaner Reidsleeve [employer] EC - and wear at night time with one 8 cm or 10 cm bandage from hand to upper arm - to decreaes forearm - until we get her a night time garment to wear in combination with her daysleeve and pump

## 2018-09-21 ENCOUNTER — Ambulatory Visit: Payer: Medicare Other | Admitting: Family Medicine

## 2018-09-26 ENCOUNTER — Ambulatory Visit
Admission: RE | Admit: 2018-09-26 | Discharge: 2018-09-26 | Disposition: A | Discharge status: Home / Self Care | Payer: Medicare Other | Source: Ambulatory Visit | Attending: Oncology | Admitting: Oncology

## 2018-09-26 DIAGNOSIS — Z171 Estrogen receptor negative status [ER-]: Secondary | ICD-10-CM | POA: Diagnosis not present

## 2018-09-26 DIAGNOSIS — Z1231 Encounter for screening mammogram for malignant neoplasm of breast: Secondary | ICD-10-CM | POA: Diagnosis not present

## 2018-09-26 DIAGNOSIS — C50412 Malignant neoplasm of upper-outer quadrant of left female breast: Secondary | ICD-10-CM | POA: Insufficient documentation

## 2018-09-26 HISTORY — DX: Personal history of antineoplastic chemotherapy: Z92.21

## 2018-09-26 HISTORY — DX: Personal history of irradiation: Z92.3

## 2018-09-27 ENCOUNTER — Encounter: Payer: Self-pay | Admitting: Radiation Oncology

## 2018-09-27 ENCOUNTER — Other Ambulatory Visit: Payer: Self-pay

## 2018-09-27 ENCOUNTER — Ambulatory Visit
Admission: RE | Admit: 2018-09-27 | Discharge: 2018-09-27 | Disposition: A | Discharge status: Home / Self Care | Payer: Medicare Other | Source: Ambulatory Visit | Attending: Radiation Oncology | Admitting: Radiation Oncology

## 2018-09-27 VITALS — BP 155/80 | HR 74 | Temp 97.0°F | Resp 16 | Wt 179.1 lb

## 2018-09-27 DIAGNOSIS — Z923 Personal history of irradiation: Secondary | ICD-10-CM | POA: Diagnosis not present

## 2018-09-27 DIAGNOSIS — Z9012 Acquired absence of left breast and nipple: Secondary | ICD-10-CM | POA: Diagnosis not present

## 2018-09-27 DIAGNOSIS — Z171 Estrogen receptor negative status [ER-]: Principal | ICD-10-CM

## 2018-09-27 DIAGNOSIS — Z853 Personal history of malignant neoplasm of breast: Secondary | ICD-10-CM | POA: Insufficient documentation

## 2018-09-27 DIAGNOSIS — C50412 Malignant neoplasm of upper-outer quadrant of left female breast: Secondary | ICD-10-CM

## 2018-09-29 ENCOUNTER — Other Ambulatory Visit: Payer: Self-pay

## 2018-09-29 ENCOUNTER — Ambulatory Visit: Payer: Medicare Other | Admitting: Occupational Therapy

## 2018-09-29 DIAGNOSIS — I972 Postmastectomy lymphedema syndrome: Secondary | ICD-10-CM

## 2018-09-29 NOTE — Therapy (Signed)
[place] [facility] [address] [place], [address] Phone: [phone]   Fax:  [phone]  Occupational Therapy Treatment  Patient Details  Name: Maria Patterson MRN: [id number] Date of Birth: 1941/03/24 Referring Provider (OT): Faythe Casa   Encounter Date: 09/29/2018  OT End of Session - 09/29/18 1041    Visit Number  14    Number of Visits  17    Date for OT Re-Evaluation  10/18/18    OT Start Time  1015    OT Stop Time  1036    OT Time Calculation (min)  21 min    Activity Tolerance  Patient tolerated treatment well    Behavior During Therapy  [facility] for tasks assessed/performed       Past Medical History:  Diagnosis Date  . Anemia   . Breast cancer ([place]) 2018   Left  . Breast mass, left 09/22/2016   RECOMMENDATION: Ultrasound-guided core biopsies of masses at left breast 2 o'clock 2 cm from nipple, left breast 2 o'clock 5 cm from nipple, abnormal left axillary lymph nodes.  Marland Kitchen GERD (gastroesophageal reflux disease)   . Hyperglycemia   . Hyperlipidemia   . Obesity   . Personal history of chemotherapy 2018   Left breast  . Personal history of radiation therapy 2018  . Postmenopausal     Past Surgical History:  Procedure Laterality Date  . ABDOMINAL HYSTERECTOMY    . AXILLARY SENTINEL NODE BIOPSY Left 04/14/2017   Procedure: AXILLARY SENTINEL NODE BIOPSY;  Surgeon: Olean Ree, MD;  Location: [facility] ORS;  Service: General;  Laterality: Left;  . BREAST BIOPSY Left 09/30/2016   US biopsy of 3 areas + chemo  . BREAST EXCISIONAL BIOPSY Left 04/14/2017   Left breast invasive cancer left mastectomy  . ESOPHAGOGASTRODUODENOSCOPY (EGD) WITH PROPOFOL N/A 12/24/2014   Procedure: ESOPHAGOGASTRODUODENOSCOPY (EGD) WITH PROPOFOL;  Surgeon: Manya Silvas, MD;  Location: [facility] ENDOSCOPY;  Service: Endoscopy;  Laterality: N/A;  . MASTECTOMY Left 04/14/2017   Left breast invasive cancer  . PORTACATH PLACEMENT Right  10/27/2016   Procedure: INSERTION PORT-A-CATH;  Surgeon: Nestor Lewandowsky, MD;  Location: [facility] ORS;  Service: General;  Laterality: Right;  . TONSILECTOMY, ADENOIDECTOMY, BILATERAL MYRINGOTOMY AND TUBES    . TONSILLECTOMY    . TOTAL MASTECTOMY Left 04/14/2017   Procedure: TOTAL MASTECTOMY;  Surgeon: Olean Ree, MD;  Location: [facility] ORS;  Service: General;  Laterality: Left;    There were no vitals filed for this visit.  Subjective Assessment - 09/29/18 1038    Subjective   I have been doing the pump 2 x day and night sleeve with bandages at night time and daysleeve - but it looks about the same- I did not get any phone calls from [employer] - my phone do not work well     Patient Stated Goals  Want my arm swelling and infection better    Currently in Pain?  No/denies          LYMPHEDEMA/ONCOLOGY QUESTIONNAIRE - 09/29/18 1018      Left Upper Extremity Lymphedema   15 cm Proximal to Olecranon Process  32.5 cm    10 cm Proximal to Olecranon Process  35 cm    Olecranon Process  28 cm    15 cm Proximal to Ulnar Styloid Process  30 cm    10 cm Proximal to Ulnar Styloid Process  26 cm    Just Proximal to Ulnar Styloid Process  16.6 cm  Across Hand at [employer]  19 cm         pt arrive with her custom daytime [employer] elvarex soft sleeve and glove -she is wearing it the whole day per pt and using her pump am and pm for 45 min to hour  Pt wore this last week a loaner Reidsleeve comfortplus at night time with 8 cm short stretch bandage over it  Pt show this date decrease circmference at forearm and elbow - pt to pick up her night time sleeve after she leaves here.  It will be a Jubilee sleeve and hand piece  Pt to follow up with me in  10 days after using the Jubilee at night time , pump 2 x day and day compression sleeve and glove    This date this OT print out information sheet about lymphedema - Reps from pump and [facility] report pt asking same questions -and has to review things   Reed pt again on lymphedema and HEP                 OT Education - 09/29/18 1041    Education Details  Lymphedema education sheet review again with pt -and HEP     Person(s) Educated  Patient    Methods  Explanation;Demonstration;Handout    Comprehension  Verbalized understanding;Returned demonstration          OT Long Term Goals - 09/20/18 1157      OT LONG TERM GOAL #1   Title  Pt's R UE circumference decrease by more than  1.5 cm in forearm, elbow and upper arm to  and 1 cm at  wrist to get measured for compression sleeve and glove     Status  Achieved      OT LONG TERM GOAL #2   Title  Assess if pt can maintain measurements in R UE  and decreaes risk for cellulitis with over the counter daytime sleeve only     Baseline  Pt unable to maintain circumference with daysleeve custom;/glove and pump - pt in need for night time compression garment     Time  4    Period  Weeks    Status  On-going    Target Date  10/18/18            Plan - 09/29/18 1059    Clinical Impression Statement  Pt was seen week ago and add night time garment to her routine because of her measurement increase at night time - even with using pump and daytime custom sleeeve -this date forearm and elbow decreased - pt to use night  Jubilee garment  in combination with daysleeve and glove - and pump 2 x day - did provided pt again hand out on lymphedema - ? memory     Occupational performance deficits (Please refer to evaluation for details):  ADL's    Rehab Potential  Good    Clinical Decision Making  Limited treatment options, no task modification necessary    OT Frequency  1x / week    OT Duration  4 weeks    OT Treatment/Interventions  Self-care/ADL training;Manual lymph drainage;Patient/family education;Compression bandaging;Therapeutic exercise;Manual Therapy    Plan  reassess circumference using Jubilee night time gartment in combination with pump and daytime comrpession     OT Home  Exercise Plan  see pt instruction    Consulted and Agree with Plan of Care  Patient       Patient will benefit from  skilled therapeutic intervention in order to improve the following deficits and impairments:     Visit Diagnosis: Postmastectomy lymphedema syndrome    Problem List Patient Active Problem List   Diagnosis Date Noted  . H/O total mastectomy of left breast 06/03/2018  . Obesity (BMI 30.0-34.9) 03/08/2018  . Insomnia 09/26/2017  . Chronic renal disease, stage III ([place]) 08/09/2017  . Hypomagnesemia 05/04/2017  . Aortic atherosclerosis ([place]) 05/04/2017  . Compression fracture of first lumbar vertebra ([place]) 05/04/2017  . Degenerative disc disease, lumbar 05/04/2017  . Anemia 02/01/2017  . Atherosclerosis of native arteries of extremity with intermittent claudication ([place]) 11/09/2016  . Allergic rhinitis 11/07/2016  . Malignant neoplasm of upper-outer quadrant of left female breast ([place]) 10/08/2016  . Osteoporosis screening 09/18/2016  . Postmenopausal 09/18/2016  . Ache in joint 03/09/2016  . Medication monitoring encounter 10/30/2015  . GERD without esophagitis 07/01/2012  . Hyperlipidemia LDL goal <100 07/01/2012    Rosalyn Gess OTR/L,CLT 09/29/2018, 8:14 PM  [place] [facility] [address], [place], [address] Phone: [phone]   Fax:  [phone]  Name: Maria Patterson MRN: [id number] Date of Birth: October 24, 1940

## 2018-09-29 NOTE — Patient Instructions (Signed)
Pt to wear daytime sleeve and glove  And night time garment - that is she picking up from [facility] after session  And still do her pump 2 x day

## 2018-10-05 ENCOUNTER — Encounter: Payer: Self-pay | Admitting: Surgery

## 2018-10-05 ENCOUNTER — Ambulatory Visit (INDEPENDENT_AMBULATORY_CARE_PROVIDER_SITE_OTHER): Payer: Medicare Other | Admitting: Surgery

## 2018-10-05 ENCOUNTER — Other Ambulatory Visit: Payer: Self-pay

## 2018-10-05 VITALS — BP 146/86 | HR 89 | Temp 97.9°F | Resp 16 | Ht 61.0 in | Wt 173.4 lb

## 2018-10-05 DIAGNOSIS — C50412 Malignant neoplasm of upper-outer quadrant of left female breast: Secondary | ICD-10-CM | POA: Diagnosis not present

## 2018-10-05 DIAGNOSIS — Z171 Estrogen receptor negative status [ER-]: Secondary | ICD-10-CM | POA: Diagnosis not present

## 2018-10-05 NOTE — Patient Instructions (Addendum)
The patient is aware to call back for any questions or new concerns.  Continue to use compression and wear lymphedema sleeve on left arm

## 2018-10-05 NOTE — Progress Notes (Signed)
10/05/2018  History of Present Illness: Maria Patterson is a 78 y.o. female with history of left breast cancer s/p neoadjuvant chemotherapy and left mastectomy with sentinel node biopsy on 04/14/17.  I saw her last 6 months ago.  In the interval she has developed lymphedema of the left upper extremity.  She is going to physical therapy for this and is using a compression sleeve and just got a machine for compression at night.  She denies noticing any new masses but does report feeling more swelling over the left chest and sometimes discomfort with pain and numbness.  She did get a recent CT of the chest on 09/19/18 which was negative except for the post-mastectomy changes that are stable compared to prior CT chest on 01/15/18.  She also had a right mammogram on 09/26/18 which did not show any evidence of masses or suspicious findings.  I have independently viewed the patient's imaging studies and agree with the findings.  Past Medical History: Past Medical History:  Diagnosis Date  . Anemia   . Breast cancer ([place]) 2018   Left  . Breast mass, left 09/22/2016   RECOMMENDATION: Ultrasound-guided core biopsies of masses at left breast 2 o'clock 2 cm from nipple, left breast 2 o'clock 5 cm from nipple, abnormal left axillary lymph nodes.  Marland Kitchen GERD (gastroesophageal reflux disease)   . Hyperglycemia   . Hyperlipidemia   . Obesity   . Personal history of chemotherapy 2018   Left breast  . Personal history of radiation therapy 2018  . Postmenopausal      Past Surgical History: Past Surgical History:  Procedure Laterality Date  . ABDOMINAL HYSTERECTOMY    . AXILLARY SENTINEL NODE BIOPSY Left 04/14/2017   Procedure: AXILLARY SENTINEL NODE BIOPSY;  Surgeon: Olean Ree, MD;  Location: [facility] ORS;  Service: General;  Laterality: Left;  . BREAST BIOPSY Left 09/30/2016   US biopsy of 3 areas + chemo  . BREAST EXCISIONAL BIOPSY Left 04/14/2017   Left breast invasive cancer left mastectomy  .  ESOPHAGOGASTRODUODENOSCOPY (EGD) WITH PROPOFOL N/A 12/24/2014   Procedure: ESOPHAGOGASTRODUODENOSCOPY (EGD) WITH PROPOFOL;  Surgeon: Manya Silvas, MD;  Location: [facility] ENDOSCOPY;  Service: Endoscopy;  Laterality: N/A;  . MASTECTOMY Left 04/14/2017   Left breast invasive cancer  . PORTACATH PLACEMENT Right 10/27/2016   Procedure: INSERTION PORT-A-CATH;  Surgeon: Nestor Lewandowsky, MD;  Location: [facility] ORS;  Service: General;  Laterality: Right;  . TONSILECTOMY, ADENOIDECTOMY, BILATERAL MYRINGOTOMY AND TUBES    . TONSILLECTOMY    . TOTAL MASTECTOMY Left 04/14/2017   Procedure: TOTAL MASTECTOMY;  Surgeon: Olean Ree, MD;  Location: [facility] ORS;  Service: General;  Laterality: Left;    Home Medications: Prior to Admission medications   Medication Sig Start Date End Date Taking? Authorizing Provider  acetaminophen (TYLENOL) 325 MG tablet Take 2 tablets (650 mg total) by mouth every 6 (six) hours as needed. 01/15/18  Yes Carrie Mew, MD  aspirin 81 MG tablet Take 81 mg by mouth daily.   Yes [provider]  atorvastatin (LIPITOR) 20 MG tablet One by mouth at bedtime three nights a week (Mon, Wed, Fri) 06/21/18  Yes Lada, Satira Anis, MD  Cholecalciferol (VITAMIN D) 2000 units tablet Take 2,000 Units by mouth 3 (three) times a week. One by mouth twice a week 09/01/16  Yes Lada, Satira Anis, MD  ezetimibe (ZETIA) 10 MG tablet Take 1 tablet (10 mg total) by mouth daily. 06/21/18  Yes Lada, Satira Anis, MD  ferrous sulfate 325 (65  FE) MG EC tablet Take 325 mg by mouth 3 (three) times a week.  09/01/16  Yes Lada, Satira Anis, MD  fluticasone (FLONASE) 50 MCG/ACT nasal spray Place 1 spray into both nostrils daily as needed for allergies or rhinitis. 06/21/18  Yes Lada, Satira Anis, MD  levocetirizine (XYZAL) 5 MG tablet Take 0.5 tablets (2.5 mg total) by mouth every evening. 01/28/18  Yes Poulose, Bethel Born, NP  Multiple Vitamin (MULTIVITAMIN WITH MINERALS) TABS tablet Take 1 tablet by mouth 3 (three) times a  week.  09/01/16  Yes Lada, Satira Anis, MD  ondansetron (ZOFRAN ODT) 4 MG disintegrating tablet Take 1 tablet (4 mg total) by mouth every 8 (eight) hours as needed for nausea or vomiting. 01/15/18  Yes Carrie Mew, MD  Polyethyl Glycol-Propyl Glycol (SYSTANE OP) Place 1 drop into both eyes daily as needed (dry eyes).    Yes [provider]  sodium chloride (OCEAN) 0.65 % SOLN nasal spray Place 1 spray into both nostrils as needed for congestion. 03/08/18  Yes Lada, Satira Anis, MD  SSD 1 % cream APPLY CREAM TOPICALLY TWICE DAILY 03/24/18  Yes [provider]    Allergies: Allergies  Allergen Reactions  . Latex Itching  . Penicillins Itching and Rash    Has patient had a PCN reaction causing immediate rash, facial/tongue/throat swelling, SOB or lightheadedness with hypotension: Yes Has patient had a PCN reaction causing severe rash involving mucus membranes or skin necrosis: No Has patient had a PCN reaction that required hospitalization No Has patient had a PCN reaction occurring within the last 10 years: No If all of the above answers are "NO", then may proceed with Cephalosporin use.     Review of Systems: Review of Systems  Constitutional: Negative for chills and fever.  Respiratory: Negative for shortness of breath.   Cardiovascular: Negative for chest pain.  Gastrointestinal: Positive for nausea. Negative for abdominal pain.  Musculoskeletal:       Left arm swelling    Physical Exam BP (!) 146/86   Pulse 89   Temp 97.9 F (36.6 C) (Temporal)   Resp 16   Ht 5\' 1"  (1.549 m)   Wt 173 lb 6.4 oz (78.7 kg)   SpO2 92%   BMI 32.76 kg/m  CONSTITUTIONAL: No acute distress HEENT:  Normocephalic, atraumatic, extraocular motion intact. RESPIRATORY:  Lungs are clear, and breath sounds are equal bilaterally. Normal respiratory effort without pathologic use of accessory muscles. CARDIOVASCULAR: Heart is regular without murmurs, gallops, or rubs. BREAST:  S/p left  mastectomy.  Incision is well healed.  Patient has stable palpable scar in the medial aspect of incision and small palpable nodule in the mid portion of the incision.  These are stable from last exam and on CT scan.  There is mild swelling subcutaneous over the left chest wall, but no erythema or evidence of infection.  Could be from her left arm lymphedema.  No lymphadenopathy.  Right breast has no masses, drainage, tenderness, erythema, or skin changes.  No right lymphadenopathy. EXTREMITIES:  Left upper extremity with lymphedema compression sleeve in place. PSYCH:  Alert and oriented to person, place and time. Affect is normal.  Labs/Imaging: Mammogram 09/26/18: FINDINGS: The patient has had a left mastectomy. There are no findings suspicious for malignancy in the RIGHT breast. Images were processed with CAD.  IMPRESSION: No mammographic evidence of malignancy.   CT scan chest 09/19/18: IMPRESSION: 1. Post-last mastectomy. No evidence of local recurrence by CT imaging. 2. Axillary or central  thoracic metastasis 3. No pulmonary metastasis 4. Coronary artery calcification and Aortic Atherosclerosis (ICD10-I70.0).  Assessment and Plan: This is a 78 y.o. female with history of left breast cancer s/p neoadjuvant chemotherapy and left mastectomy with sentinel node biopsy  Discussed with the patient that her CT scan and mammogram did not show any evidence of recurrence and that the nodule and scarring on exam today are stable from prior exam and correlate with findings on her CT scan as well.  Recommended that she continue following physical therapy instructions and continue with the compression sleeve and pneumatic device.    She will follow up in 6 months for her two year follow up.   Face-to-face time spent with the patient and care providers was 15 minutes, with more than 50% of the time spent counseling, educating, and coordinating care of the patient.     Melvyn Neth,  [facility]

## 2018-10-10 ENCOUNTER — Encounter: Payer: Self-pay | Admitting: Occupational Therapy

## 2018-10-10 NOTE — Therapy (Signed)
[place] [facility] [address], [place], [address] Phone: [phone]   Fax:  [phone]  Patient Details  Name: Maria Patterson MRN: [id number] Date of Birth: 1940/12/21 Referring Provider:  No ref. provider found  Encounter Date: 10/10/2018   Called pt today about clinic will be closed for 2 wks because of the Corona Virus - pt report she did get the message.  Seen her surgeon last week and doing good - no recurrence per CT - Pt did pick up her new night time sleeve on the 13th March after the left here Pt to cont with pump 2 x day ( am and pm for 45 min to hour) - but it do not have chest piece - only arm piece per pt - to cont with that  And  Cont with her  night time compression and daytime compression - pt to contact me in 2-3 wks to schedule follow up    Rosalyn Gess OTR/L,CLT 10/10/2018, 3:13 PM  [place] [facility] [address] [place], [address] Phone: [phone]   Fax:  [phone]

## 2018-10-11 ENCOUNTER — Ambulatory Visit: Payer: Medicare Other | Admitting: Occupational Therapy

## 2018-10-21 ENCOUNTER — Ambulatory Visit (INDEPENDENT_AMBULATORY_CARE_PROVIDER_SITE_OTHER): Payer: Medicare Other | Admitting: Family Medicine

## 2018-10-21 ENCOUNTER — Encounter: Payer: Self-pay | Admitting: Family Medicine

## 2018-10-21 DIAGNOSIS — Z9012 Acquired absence of left breast and nipple: Secondary | ICD-10-CM

## 2018-10-21 DIAGNOSIS — I700 Atherosclerosis of aorta: Secondary | ICD-10-CM | POA: Diagnosis not present

## 2018-10-21 DIAGNOSIS — N183 Chronic kidney disease, stage 3 unspecified: Secondary | ICD-10-CM

## 2018-10-21 DIAGNOSIS — E538 Deficiency of other specified B group vitamins: Secondary | ICD-10-CM

## 2018-10-21 DIAGNOSIS — E785 Hyperlipidemia, unspecified: Secondary | ICD-10-CM

## 2018-10-21 DIAGNOSIS — J3089 Other allergic rhinitis: Secondary | ICD-10-CM

## 2018-10-21 DIAGNOSIS — D649 Anemia, unspecified: Secondary | ICD-10-CM

## 2018-10-21 DIAGNOSIS — E669 Obesity, unspecified: Secondary | ICD-10-CM

## 2018-10-21 MED ORDER — LEVOCETIRIZINE DIHYDROCHLORIDE 5 MG PO TABS: 2.5000 mg | ORAL_TABLET | Freq: Every evening | ORAL | 11 refills | Status: DC

## 2018-10-21 MED ORDER — FLUTICASONE PROPIONATE 50 MCG/ACT NA SUSP: 2.0000 | Freq: Every day | NASAL | 11 refills | Status: DC | PRN

## 2018-10-21 NOTE — Assessment & Plan Note (Signed)
Continue statin and zetia and trying to eat better

## 2018-10-21 NOTE — Assessment & Plan Note (Signed)
Check CBC; may be secondary to renal disease

## 2018-10-21 NOTE — Assessment & Plan Note (Addendum)
Encouraged healthy eating, limit saturated fats; return for labs in the next week or two

## 2018-10-21 NOTE — Progress Notes (Signed)
There were no vitals taken for this visit.   Subjective:    Patient ID: Maria Patterson, female    DOB: 1941-04-09, 78 y.o.   MRN: [id number]  HPI: Maria Patterson is a 78 y.o. female  Chief Complaint  Patient presents with  . Follow-up  . Medication Refill    HPI Virtual Visit via Telephone Note   I connected with Maria Patterson on October 21, 2018 at 0957 hours EDT by telephone and verified that I am speaking with the correct person using two identifiers.   Staff discussed the limitations, risks, security, and privacy concerns of performing an evaluation and management service by telephone and the availability of in-person appointments. Staff discussed with the patient that he/she may be responsible for charges related to this service. The patient expressed understanding and agreed to proceed. The patient was asked to check her surroundings, be sure to keep phone off of speaker, let me know immediately if someone comes near and our conversation has to be terminated or paused for privacy concerns.  Additional participants: none  Patient location: [employer] in the car; in car alone Provider location: home  Call started: 0957 hours Call terminated: 1010 hours Total length of call: 13 minutes 49 seconds  She has been doing okay; this is her first day out since last Friday; trying to stay home, "May the [place] be with [place] all"  Allergic rhinitis; the nasal spray really helps; would like refills of the sapray and xyzal  Hyperlipidemia and aortic athero; tries to eat health "sometimes"; she has a good appetite; gives herself credit sometimes; taking atorvastatin 3 nights a week; gets bad aches if taking daily, "gives me a fit"; also on zetia; offered cholesterol specialist, but she just wants me to manage, "i'll keep on doing what I'm doing"  Lab Results  Component Value Date   CHOL 371 (H) 05/09/2018   HDL 59 05/09/2018   Minor 288 (H) 05/09/2018   TRIG 109  05/09/2018   CHOLHDL 6.3 (H) 05/09/2018   Anemia; stable per last labs; sometimes a little tired, when carrying the trash out, there is a little hill coming up the hill; active otherwise  Vitamin B12 was 398 in December; taking vitamin OTC  CKD, stage 3; not seeing kidney specialist  Depression screen [facility] 2/9 10/21/2018 06/29/2018 06/21/2018 06/08/2018 03/08/2018  Decreased Interest 0 0 0 0 0  Down, Depressed, Hopeless 0 0 0 0 0  PHQ - 2 Score 0 0 0 0 0  Altered sleeping 0 0 0 0 3  Tired, decreased energy 0 0 0 0 0  Change in appetite 0 0 0 0 0  Feeling bad or failure about yourself  0 0 0 0 0  Trouble concentrating 0 0 0 0 0  Moving slowly or fidgety/restless 0 0 0 0 0  Suicidal thoughts 0 0 0 0 0  PHQ-9 Score 0 0 0 0 3  Difficult doing work/chores Not difficult at all Not difficult at all Not difficult at all Not difficult at all Not difficult at all  Some recent data might be hidden   Fall Risk  10/21/2018 10/05/2018 06/29/2018 06/21/2018 06/08/2018  Falls in the past year? 0 0 1 1 0  Comment - - - - -  Number falls in past yr: - 0 1 0 -  Injury with Fall? - - 0 0 -  Risk for fall due to : - - - - -  Risk for  fall due to: Comment - - - - -  Follow up - Falls evaluation completed - - -    Relevant past medical, surgical, family and social history reviewed Past Medical History:  Diagnosis Date  . Anemia   . Breast cancer ([place]) 2018   Left  . Breast mass, left 09/22/2016   RECOMMENDATION: Ultrasound-guided core biopsies of masses at left breast 2 o'clock 2 cm from nipple, left breast 2 o'clock 5 cm from nipple, abnormal left axillary lymph nodes.  Marland Kitchen GERD (gastroesophageal reflux disease)   . Hyperglycemia   . Hyperlipidemia   . Obesity   . Personal history of chemotherapy 2018   Left breast  . Personal history of radiation therapy 2018  . Postmenopausal    Past Surgical History:  Procedure Laterality Date  . ABDOMINAL HYSTERECTOMY    . AXILLARY SENTINEL NODE BIOPSY Left  04/14/2017   Procedure: AXILLARY SENTINEL NODE BIOPSY;  Surgeon: Olean Ree, MD;  Location: [facility] ORS;  Service: General;  Laterality: Left;  . BREAST BIOPSY Left 09/30/2016   US biopsy of 3 areas + chemo  . BREAST EXCISIONAL BIOPSY Left 04/14/2017   Left breast invasive cancer left mastectomy  . ESOPHAGOGASTRODUODENOSCOPY (EGD) WITH PROPOFOL N/A 12/24/2014   Procedure: ESOPHAGOGASTRODUODENOSCOPY (EGD) WITH PROPOFOL;  Surgeon: Manya Silvas, MD;  Location: [facility] ENDOSCOPY;  Service: Endoscopy;  Laterality: N/A;  . MASTECTOMY Left 04/14/2017   Left breast invasive cancer  . PORTACATH PLACEMENT Right 10/27/2016   Procedure: INSERTION PORT-A-CATH;  Surgeon: Nestor Lewandowsky, MD;  Location: [facility] ORS;  Service: General;  Laterality: Right;  . TONSILECTOMY, ADENOIDECTOMY, BILATERAL MYRINGOTOMY AND TUBES    . TONSILLECTOMY    . TOTAL MASTECTOMY Left 04/14/2017   Procedure: TOTAL MASTECTOMY;  Surgeon: Olean Ree, MD;  Location: [facility] ORS;  Service: General;  Laterality: Left;   Family History  Problem Relation Age of Onset  . Stroke Mother   . Stroke Father   . Cancer Neg Hx   . Depression Neg Hx   . Breast cancer Neg Hx    Social History   Tobacco Use  . Smoking status: Never Smoker  . Smokeless tobacco: Never Used  . Tobacco comment: smoking cessation materials not required  Substance Use Topics  . Alcohol use: No    Alcohol/week: 0.0 standard drinks  . Drug use: No     Office Visit from 10/21/2018 in [facility]  AUDIT-C Score  0      Interim medical history since last visit reviewed. Allergies and medications reviewed  Review of Systems Per HPI unless specifically indicated above     Objective:    There were no vitals taken for this visit.  Wt Readings from Last 3 Encounters:  10/05/18 173 lb 6.4 oz (78.7 kg)  09/27/18 179 lb 2 oz (81.3 kg)  09/07/18 173 lb 8 oz (78.7 kg)    Physical Exam Pulmonary:     Effort: No respiratory distress.   Neurological:     Mental Status: She is alert.     Cranial Nerves: No dysarthria.  Psychiatric:        Speech: Speech is not rapid and pressured, delayed or slurred.    Results for orders placed or performed during the hospital encounter of 09/19/18  I-STAT creatinine  Result Value Ref Range   Creatinine, Ser 1.20 (H) 0.44 - 1.00 mg/dL      Assessment & Plan:   Problem List Items Addressed This Visit  Cardiovascular and Mediastinum   Aortic atherosclerosis (HCC)    Continue statin and zetia and trying to eat better      Relevant Orders   Lipid panel     Respiratory   Allergic rhinitis - Primary    Nasal corticosteroid really helps, refills provided      Relevant Medications   fluticasone (FLONASE) 50 MCG/ACT nasal spray   levocetirizine (XYZAL) 5 MG tablet     Genitourinary   Chronic renal disease, stage III ([place])    Refer to kidney doctor; avoid NSAIDs; stay hydrated      Relevant Orders   Ambulatory referral to Nephrology   COMPLETE METABOLIC PANEL WITH GFR   VITAMIN D 25 Hydroxy (Vit-D Deficiency, Fractures)     Other   Obesity (BMI 30.0-34.9)    Encouraged a little bit of weight loss, eating better      Hyperlipidemia LDL goal <100    Encouraged healthy eating, limit saturated fats; return for labs in the next week or two      Relevant Orders   Lipid panel   H/O total mastectomy of left breast    Managed by radiation onc and surgeon; no new symptoms      Anemia    Check CBC; may be secondary to renal disease      Relevant Orders   CBC   Iron, TIBC and Ferritin Panel    Other Visit Diagnoses    Vitamin B12 deficiency       Relevant Orders   B12       Follow up plan: No follow-ups on file.  An after-visit summary was printed and given to the patient at [place].  Please see the patient instructions which may contain other information and recommendations beyond what is mentioned above in the assessment and plan.  Meds ordered this  encounter  Medications  . fluticasone (FLONASE) 50 MCG/ACT nasal spray    Sig: Place 2 sprays into both nostrils daily as needed for allergies or rhinitis.    Dispense:  16 g    Refill:  11  . levocetirizine (XYZAL) 5 MG tablet    Sig: Take 0.5 tablets (2.5 mg total) by mouth every evening.    Dispense:  15 tablet    Refill:  11    Orders Placed This Encounter  Procedures  . CBC  . COMPLETE METABOLIC PANEL WITH GFR  . Lipid panel  . B12  . Iron, TIBC and Ferritin Panel  . VITAMIN D 25 Hydroxy (Vit-D Deficiency, Fractures)  . Ambulatory referral to Nephrology

## 2018-10-21 NOTE — Assessment & Plan Note (Signed)
Encouraged a little bit of weight loss, eating better

## 2018-10-21 NOTE — Assessment & Plan Note (Signed)
Refer to kidney doctor; avoid NSAIDs; stay hydrated

## 2018-10-21 NOTE — Assessment & Plan Note (Signed)
Nasal corticosteroid really helps, refills provided

## 2018-10-21 NOTE — Assessment & Plan Note (Signed)
Managed by radiation onc and surgeon; no new symptoms

## 2018-10-24 ENCOUNTER — Other Ambulatory Visit: Payer: Self-pay

## 2018-10-24 DIAGNOSIS — D649 Anemia, unspecified: Secondary | ICD-10-CM | POA: Diagnosis not present

## 2018-10-24 DIAGNOSIS — E538 Deficiency of other specified B group vitamins: Secondary | ICD-10-CM | POA: Diagnosis not present

## 2018-10-24 DIAGNOSIS — I700 Atherosclerosis of aorta: Secondary | ICD-10-CM | POA: Diagnosis not present

## 2018-10-24 DIAGNOSIS — N183 Chronic kidney disease, stage 3 unspecified: Secondary | ICD-10-CM

## 2018-10-24 DIAGNOSIS — E785 Hyperlipidemia, unspecified: Secondary | ICD-10-CM

## 2018-10-25 LAB — IRON,TIBC AND FERRITIN PANEL
%SAT: 34 % (calc) (ref 16–45)
Ferritin: 198 ng/mL (ref 16–288)
Iron: 92 ug/dL (ref 45–160)
TIBC: 270 mcg/dL (calc) (ref 250–450)

## 2018-10-25 LAB — CBC
HCT: 37.6 % (ref 35.0–45.0)
Hemoglobin: 12.6 g/dL (ref 11.7–15.5)
MCH: 31 pg (ref 27.0–33.0)
MCHC: 33.5 g/dL (ref 32.0–36.0)
MCV: 92.6 fL (ref 80.0–100.0)
MPV: 10.5 fL (ref 7.5–12.5)
Platelets: 214 10*3/uL (ref 140–400)
RBC: 4.06 10*6/uL (ref 3.80–5.10)
RDW: 13.7 % (ref 11.0–15.0)
WBC: 5.4 10*3/uL (ref 3.8–10.8)

## 2018-10-25 LAB — LIPID PANEL
Cholesterol: 282 mg/dL — ABNORMAL HIGH (ref <200)
HDL: 70 mg/dL (ref >=50)
LDL Cholesterol (Calc): 185 mg/dL (calc) — ABNORMAL HIGH
Non-HDL Cholesterol (Calc): 212 mg/dL (calc) — ABNORMAL HIGH (ref <130)
Total CHOL/HDL Ratio: 4 (calc) (ref <5.0)
Triglycerides: 136 mg/dL (ref <150)

## 2018-10-25 LAB — COMPLETE METABOLIC PANEL WITH GFR
AG Ratio: 1.5 (calc) (ref 1.0–2.5)
ALT: 15 U/L (ref 6–29)
AST: 18 U/L (ref 10–35)
Albumin: 4.2 g/dL (ref 3.6–5.1)
Alkaline phosphatase (APISO): 105 U/L (ref 37–153)
BUN/Creatinine Ratio: 15 (calc) (ref 6–22)
BUN: 20 mg/dL (ref 7–25)
CO2: 23 mmol/L (ref 20–32)
Calcium: 9.6 mg/dL (ref 8.6–10.4)
Chloride: 110 mmol/L (ref 98–110)
Creat: 1.31 mg/dL — ABNORMAL HIGH (ref 0.60–0.93)
GFR, Est African American: 45 mL/min/{1.73_m2} — ABNORMAL LOW (ref >=60)
GFR, Est Non African American: 39 mL/min/{1.73_m2} — ABNORMAL LOW (ref >=60)
Globulin: 2.8 g/dL (calc) (ref 1.9–3.7)
Glucose, Bld: 82 mg/dL (ref 65–99)
Potassium: 4.4 mmol/L (ref 3.5–5.3)
Sodium: 143 mmol/L (ref 135–146)
Total Bilirubin: 0.4 mg/dL (ref 0.2–1.2)
Total Protein: 7 g/dL (ref 6.1–8.1)

## 2018-10-25 LAB — VITAMIN D 25 HYDROXY (VIT D DEFICIENCY, FRACTURES): Vit D, 25-Hydroxy: 30 ng/mL (ref 30–100)

## 2018-10-25 LAB — VITAMIN B12: Vitamin B-12: 499 pg/mL (ref 200–1100)

## 2018-12-21 ENCOUNTER — Ambulatory Visit: Payer: Self-pay | Admitting: Family Medicine

## 2018-12-21 NOTE — Chronic Care Management (AMB) (Signed)
Chronic Care Management   Note  12/21/2018 Name: Maria Patterson MRN: [id number] DOB: 25-Sep-1940  Maria Patterson is a 78 y.o. year old female who is a primary care patient of Lada, Satira Anis, MD. I reached out to [name] by phone today in response to a referral sent by Maria Patterson.    Maria Patterson was given information about Chronic Care Management services today including:  1. CCM service includes personalized support from designated clinical staff supervised by her physician, including individualized Patterson of care and coordination with other care providers 2. 24/7 contact phone numbers for assistance for urgent and routine care needs. 3. Service will only be billed when office clinical staff spend 20 minutes or more in a month to coordinate care. 4. Only one practitioner may furnish and bill the service in a calendar month. 5. The patient may stop CCM services at any time (effective at the end of the month) by phone call to the office staff. 6. The patient will be responsible for cost sharing (co-pay) of up to 20% of the service fee (after annual deductible is met).  Patient agreed to services and verbal consent obtained.   Follow up Patterson: Telephone appointment with CCM team member scheduled for: 12/30/2018  [place]  ??Maria Patterson   ??Maria Patterson

## 2018-12-30 ENCOUNTER — Other Ambulatory Visit: Payer: Self-pay

## 2018-12-30 ENCOUNTER — Ambulatory Visit: Payer: Medicare Other

## 2018-12-30 NOTE — Chronic Care Management (AMB) (Signed)
  Chronic Care Management   Note  12/30/2018 Name: Maria Patterson MRN: [id number] DOB: [birth year]  Maria Patterson is a 78 year old female who sees Enid Derry, MD for primary care. Maria Patterson was referred to [place] by her health plan and consented to services 12/21/2018.  Patient has a history of but not limited to Left breast CA with mastectomy, CKD3, Hyperlipidemia, and DDD. Telephone outreach to patient today as scheduled to complete initial health assessment and help patient establish health goals.  Patient states she is unsure why I am calling. Wishes to have program re-explained. Maria Patterson feels she is doing very well managing her health. She can afford her medications and takes as prescribed. She has family support that she leans on. She attends all her medical appointments and states is compliant with all treatment plans.  Based on labs below, patient could benefit from HTN and dyslipidemia education however she refuses at this time.  Lab Results  Component Value Date   HGBA1C 5.6 09/20/2017   BP Readings from Last 3 Encounters:  10/05/18 (!) 146/86  09/27/18 (!) 155/80  09/07/18 (!) 140/93   Lab Results  Component Value Date   CHOL 282 (H) 10/24/2018   HDL 70 10/24/2018   LDLCALC 185 (H) 10/24/2018   TRIG 136 10/24/2018   CHOLHDL 4.0 10/24/2018     SDOH (Social Determinants of Health) screening performed today. See Care Plan Entry related to challenges with: None  Maria Patterson was given information about Chronic Care Management services today including:  1. CCM service includes personalized support from designated clinical staff supervised by her physician, including individualized plan of care and coordination with other care providers 2. 24/7 contact phone numbers for assistance for urgent and routine care needs. 3. Service will only be billed when office clinical staff spend 20 minutes or more in a month to coordinate care. 4. Only one practitioner may furnish and bill  the service in a calendar month. 5. The patient may stop CCM services at any time (effective at the end of the month) by phone call to the office staff. 6. The patient will be responsible for cost sharing (co-pay) of up to 20% of the service fee (after annual deductible is met).  Patient did not agree to enrollment in care management services and does not wish to consider at this time.  Patient rescinded her initial consent from 12/21/2018.  Plan: Patient was provided CCM RN CM contact information and encouraged to call if she changed her mind about enrollment in CCM Services. Patient also encouraged to speak with her primary care provider to discuss how CCM Services could improve her health.    Finley Chevez E. Rollene Rotunda, RN, BSN Nurse Care Coordinator [facility] Care Management  [phone]

## 2019-01-06 ENCOUNTER — Encounter: Payer: Self-pay | Admitting: Family Medicine

## 2019-01-06 DIAGNOSIS — N183 Chronic kidney disease, stage 3 (moderate): Secondary | ICD-10-CM | POA: Diagnosis not present

## 2019-01-06 LAB — LIPID PANEL
Cholesterol: 282 — AB (ref 0–200)
HDL: 70 (ref 35–70)
LDL Cholesterol: 185
Triglycerides: 136 (ref 40–160)

## 2019-01-06 LAB — VITAMIN B12: Vitamin B-12: 499

## 2019-01-06 LAB — HEPATIC FUNCTION PANEL
ALT: 15 (ref 7–35)
AST: 18 (ref 13–35)
Bilirubin, Total: 7

## 2019-01-06 LAB — BASIC METABOLIC PANEL
BUN: 20 (ref 4–21)
Creatinine: 1.3 — AB (ref 0.5–1.1)
Glucose: 82
Potassium: 4.4 (ref 3.4–5.3)
Sodium: 143 (ref 137–147)

## 2019-01-06 LAB — CBC AND DIFFERENTIAL
Hemoglobin: 12.6 (ref 12.0–16.0)
Platelets: 214 (ref 150–399)

## 2019-01-06 LAB — VITAMIN D 25 HYDROXY (VIT D DEFICIENCY, FRACTURES): Vit D, 25-Hydroxy: 30

## 2019-01-10 ENCOUNTER — Other Ambulatory Visit: Payer: Self-pay | Admitting: Nephrology

## 2019-01-10 DIAGNOSIS — N183 Chronic kidney disease, stage 3 unspecified: Secondary | ICD-10-CM

## 2019-01-19 ENCOUNTER — Ambulatory Visit: Admission: RE | Admit: 2019-01-19 | Payer: Medicare Other | Source: Ambulatory Visit

## 2019-01-25 ENCOUNTER — Other Ambulatory Visit: Payer: Self-pay

## 2019-01-25 ENCOUNTER — Ambulatory Visit (INDEPENDENT_AMBULATORY_CARE_PROVIDER_SITE_OTHER): Payer: Medicare Other | Admitting: Family Medicine

## 2019-01-25 ENCOUNTER — Encounter: Payer: Self-pay | Admitting: Family Medicine

## 2019-01-25 DIAGNOSIS — S99921A Unspecified injury of right foot, initial encounter: Secondary | ICD-10-CM | POA: Diagnosis not present

## 2019-01-25 DIAGNOSIS — Z9181 History of falling: Secondary | ICD-10-CM | POA: Diagnosis not present

## 2019-01-25 NOTE — Progress Notes (Signed)
Name: Maria Patterson   MRN: [id number]    DOB: 1941/06/20   Date:01/25/2019       Progress Note  Subjective  Chief Complaint  Chief Complaint  Patient presents with  . Fall  . Foot Pain    swollen ankle and top of foot    I connected with  Maria Patterson on 01/25/19 at  9:40 AM EDT by telephone and verified that I am speaking with the correct person using two identifiers.   I discussed the limitations, risks, security and privacy concerns of performing an evaluation and management service by telephone and the availability of in person appointments. Staff also discussed with the patient that there may be a patient responsible charge related to this service. Patient Location: Home Provider Location: Home Additional Individuals present: None  HPI  Pt presents via telephone today with concern for fall that occurred Monday (2 days ago).  She notes that she slid across her kitchen floor by accident (due to a spilled soda) - she is having right foot pain.  She notes that the swelling as improved some since yesterday, but still cannot put her full weight on the foot.  Pain is on the top of her foot.    Patient Active Problem List   Diagnosis Date Noted  . H/O total mastectomy of left breast 06/03/2018  . Obesity (BMI 30.0-34.9) 03/08/2018  . Insomnia 09/26/2017  . Chronic renal disease, stage III ([place]) 08/09/2017  . Hypomagnesemia 05/04/2017  . Aortic atherosclerosis ([place]) 05/04/2017  . Compression fracture of first lumbar vertebra ([place]) 05/04/2017  . Degenerative disc disease, lumbar 05/04/2017  . Anemia 02/01/2017  . Atherosclerosis of native arteries of extremity with intermittent claudication ([place]) 11/09/2016  . Allergic rhinitis 11/07/2016  . Malignant neoplasm of upper-outer quadrant of left female breast ([place]) 10/08/2016  . Osteoporosis screening 09/18/2016  . Postmenopausal 09/18/2016  . Ache in joint 03/09/2016  . Medication monitoring encounter 10/30/2015  . GERD without  esophagitis 07/01/2012  . Hyperlipidemia LDL goal <100 07/01/2012    Social History   Tobacco Use  . Smoking status: Never Smoker  . Smokeless tobacco: Never Used  . Tobacco comment: smoking cessation materials not required  Substance Use Topics  . Alcohol use: No    Alcohol/week: 0.0 standard drinks     Current Outpatient Medications:  .  acetaminophen (TYLENOL) 325 MG tablet, Take 2 tablets (650 mg total) by mouth every 6 (six) hours as needed., Disp: 60 tablet, Rfl: 0 .  aspirin 81 MG tablet, Take 81 mg by mouth daily., Disp: , Rfl:  .  atorvastatin (LIPITOR) 20 MG tablet, One by mouth at bedtime three nights a week (Mon, Wed, Fri), Disp: 39 tablet, Rfl: 1 .  Cholecalciferol (VITAMIN D) 2000 units tablet, Take 2,000 Units by mouth 3 (three) times a week. One by mouth twice a week, Disp: , Rfl:  .  ezetimibe (ZETIA) 10 MG tablet, Take 1 tablet (10 mg total) by mouth daily., Disp: 30 tablet, Rfl: 11 .  ferrous sulfate 325 (65 FE) MG EC tablet, Take 325 mg by mouth 3 (three) times a week. , Disp: , Rfl:  .  fluticasone (FLONASE) 50 MCG/ACT nasal spray, Place 2 sprays into both nostrils daily as needed for allergies or rhinitis., Disp: 16 g, Rfl: 11 .  levocetirizine (XYZAL) 5 MG tablet, Take 0.5 tablets (2.5 mg total) by mouth every evening., Disp: 15 tablet, Rfl: 11 .  Multiple Vitamin (MULTIVITAMIN WITH MINERALS) TABS tablet, Take 1  tablet by mouth 3 (three) times a week. , Disp: , Rfl:  .  Polyethyl Glycol-Propyl Glycol (SYSTANE OP), Place 1 drop into both eyes daily as needed (dry eyes). , Disp: , Rfl:  .  sodium chloride (OCEAN) 0.65 % SOLN nasal spray, Place 1 spray into both nostrils as needed for congestion., Disp: 60 mL, Rfl: 11 .  ondansetron (ZOFRAN ODT) 4 MG disintegrating tablet, Take 1 tablet (4 mg total) by mouth every 8 (eight) hours as needed for nausea or vomiting. (Patient not taking: Reported on 10/21/2018), Disp: 20 tablet, Rfl: 0 .  SSD 1 % cream, APPLY CREAM  TOPICALLY TWICE DAILY, Disp: , Rfl: 3 No current facility-administered medications for this visit.   Facility-Administered Medications Ordered in Other Visits:  .  heparin lock flush 100 unit/mL, 500 Units, Intravenous, Once, Finnegan, Kathlene November, MD  Allergies  Allergen Reactions  . Latex Itching  . Penicillins Itching and Rash    Has patient had a PCN reaction causing immediate rash, facial/tongue/throat swelling, SOB or lightheadedness with hypotension: Yes Has patient had a PCN reaction causing severe rash involving mucus membranes or skin necrosis: No Has patient had a PCN reaction that required hospitalization No Has patient had a PCN reaction occurring within the last 10 years: No If all of the above answers are "NO", then may proceed with Cephalosporin use.     I personally reviewed active problem list, medication list, allergies, notes from last encounter with the patient/caregiver today.  ROS  Ten systems reviewed and is negative except as mentioned in HPI  Objective  Virtual encounter, vitals not obtained.  There is no height or weight on file to calculate BMI.  Nursing Note and Vital Signs reviewed.  Physical Exam  Pulmonary/Chest: Effort normal. No respiratory distress. Speaking in complete sentences Neurological: Pt is alert and oriented to person, place, and time. Speech is normal  Psychiatric: Patient has a normal mood and affect. behavior is normal. Judgment and thought content normal.  No results found for this or any previous visit (from the past 72 hour(s)).  Assessment & Plan  1. History of recent fall - Fall was due to a spilled soda, I do not feel she needs additional home evaluation at this point.  Did make several fall precaution recommendations - rails on stairs, good lighting, no loose throw rugs, etc.  2. Injury of right foot, initial encounter - She refuses Xrays today as her swelling and pain are improving.  I advised to call back if she  changes her mind or if she does not continue to improve.  Recommend RICE measures and tylenol PRN.  My exam is extremely limited as patient is only able to do a telephone visit today and not a video visit due to her limited ability to work with the technology.  -Red flags and when to present for emergency care or RTC including fever >101.44F, chest pain, shortness of breath, new/worsening/un-resolving symptoms, reviewed with patient at time of visit. Follow up and care instructions discussed and provided in AVS. - I discussed the assessment and treatment plan with the patient. The patient was provided an opportunity to ask questions and all were answered. The patient agreed with the plan and demonstrated an understanding of the instructions.  - The patient was advised to call back or seek an in-person evaluation if the symptoms worsen or if the condition fails to improve as anticipated.  I provided 13 minutes of non-face-to-face time during this encounter.  Astrid Divine  [place]do Rising, [place]

## 2019-02-01 ENCOUNTER — Ambulatory Visit: Payer: Medicare Other

## 2019-02-02 ENCOUNTER — Ambulatory Visit: Payer: Medicare Other

## 2019-02-06 ENCOUNTER — Ambulatory Visit: Payer: Medicare Other | Admitting: Oncology

## 2019-02-07 DIAGNOSIS — H2513 Age-related nuclear cataract, bilateral: Secondary | ICD-10-CM | POA: Diagnosis not present

## 2019-02-09 ENCOUNTER — Ambulatory Visit
Admission: RE | Admit: 2019-02-09 | Discharge: 2019-02-09 | Disposition: A | Discharge status: Home / Self Care | Payer: Medicare Other | Source: Ambulatory Visit | Attending: Nephrology | Admitting: Nephrology

## 2019-02-09 ENCOUNTER — Other Ambulatory Visit: Payer: Self-pay

## 2019-02-09 DIAGNOSIS — N189 Chronic kidney disease, unspecified: Secondary | ICD-10-CM | POA: Diagnosis not present

## 2019-02-09 DIAGNOSIS — N183 Chronic kidney disease, stage 3 unspecified: Secondary | ICD-10-CM

## 2019-02-12 NOTE — Progress Notes (Signed)
[place]  [phone]) [phone] [phone]) [phone]  ID: Maria Patterson OB: [birth year]  MR#: [id number]  [id number]#:[id number]  Patient Care Team: Arnetha Courser, MD as PCP - General (Family Medicine) Theodore Demark, RN as Oncology Nurse Navigator Clayburn Pert, MD as Consulting Physician (General Surgery) Lloyd Huger, MD as Consulting Physician (Oncology) Olean Ree, MD as Consulting Physician (Surgery) Noreene Filbert, MD as Referring Physician (Radiation Oncology)  I connected with Maria Patterson on 02/16/19 at 10:00 AM EDT by telephone visit and verified that I am speaking with the correct person using two identifiers.   I discussed the limitations, risks, security and privacy concerns of performing an evaluation and management service by telemedicine and the availability of in-person appointments. I also discussed with the patient that there may be a patient responsible charge related to this service. The patient expressed understanding and agreed to proceed.   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  INTERVAL HISTORY: Patient agreed to her routine 59-monthevaluation by telephone.  She currently feels well and is asymptomatic.  She continues to have a mild lymphedema in her left arm.  She does not complain of arthritic pain today.  She has no neurologic complaints. She denies any recent fevers or illnesses. She has a good appetite and denies weight loss.  She denies any chest pain, shortness of breath, cough, or hemoptysis.  She denies any nausea, vomiting, constipation, or diarrhea. She has no urinary complaints.  Patient feels at her baseline offers no specific complaints today.  REVIEW OF SYSTEMS:   Review of  Systems  Constitutional: Negative.  Negative for fever, malaise/fatigue and weight loss.  Respiratory: Negative.  Negative for cough and shortness of breath.   Cardiovascular: Negative.  Negative for chest pain and leg swelling.  Gastrointestinal: Negative.  Negative for abdominal pain and constipation.  Genitourinary: Negative.  Negative for dysuria.  Musculoskeletal: Negative.  Negative for back pain, joint pain and myalgias.  Skin: Negative.  Negative for rash.  Neurological: Negative.  Negative for sensory change, focal weakness, weakness and headaches.  Psychiatric/Behavioral: Negative for depression. The patient is not nervous/anxious.     As per HPI. Otherwise, a complete review of systems is negative.  PAST MEDICAL HISTORY: Past Medical History:  Diagnosis Date   Anemia    [place]   [place]   Breast mass, left 09/22/2016   RECOMMENDATION: Ultrasound-guided core biopsies of masses at left breast 2 o'clock 2 cm from nipple, left breast 2 o'clock 5 cm from nipple, abnormal left axillary lymph nodes.   GERD (gastroesophageal reflux disease)    Hyperglycemia    Hyperlipidemia    Obesity    Personal history of chemotherapy 2018   Left breast   Personal history of radiation therapy 2018   Postmenopausal     PAST SURGICAL HISTORY: Past Surgical History:  Procedure Laterality Date   ABDOMINAL HYSTERECTOMY     AXILLARY SENTINEL NODE BIOPSY Left 04/14/2017   Procedure: AXILLARY SENTINEL NODE BIOPSY;  Surgeon: POlean Ree MD;  Location: [facility] ORS;  Service: General;  Laterality: Left;   BREAST BIOPSY Left 09/30/2016   UKoreabiopsy of 3 areas + chemo   BREAST EXCISIONAL BIOPSY Left 04/14/2017   Left breast invasive cancer left mastectomy   ESOPHAGOGASTRODUODENOSCOPY (EGD)  WITH PROPOFOL N/A 12/24/2014   Procedure: ESOPHAGOGASTRODUODENOSCOPY (EGD) WITH PROPOFOL;  Surgeon: Manya Silvas, MD;  Location: [facility] ENDOSCOPY;  Service: Endoscopy;  Laterality: N/A;    MASTECTOMY Left 04/14/2017   Left breast invasive cancer   PORTACATH PLACEMENT Right 10/27/2016   Procedure: INSERTION PORT-A-CATH;  Surgeon: Nestor Lewandowsky, MD;  Location: [facility] ORS;  Service: General;  Laterality: Right;   TONSILECTOMY, ADENOIDECTOMY, BILATERAL MYRINGOTOMY AND TUBES     TONSILLECTOMY     TOTAL MASTECTOMY Left 04/14/2017   Procedure: TOTAL MASTECTOMY;  Surgeon: Olean Ree, MD;  Location: [facility] ORS;  Service: General;  Laterality: Left;    FAMILY HISTORY: Family History  Problem Relation Age of Onset   Stroke Mother    Stroke Father    Cancer Neg Hx    Depression Neg Hx    Breast cancer Neg Hx     ADVANCED DIRECTIVES (Y/N):  N  HEALTH MAINTENANCE: Social History   Tobacco Use   Smoking status: Never Smoker   Smokeless tobacco: Never Used   Tobacco comment: smoking cessation materials not required  Substance Use Topics   Alcohol use: No    Alcohol/week: 0.0 standard drinks   Drug use: No     Colonoscopy:  PAP:  Bone density:  Lipid panel:  Allergies  Allergen Reactions   Latex Itching   Penicillins Itching and Rash    Has patient had a PCN reaction causing immediate rash, facial/tongue/throat swelling, SOB or lightheadedness with hypotension: Yes Has patient had a PCN reaction causing severe rash involving mucus membranes or skin necrosis: No Has patient had a PCN reaction that required hospitalization No Has patient had a PCN reaction occurring within the last 10 years: No If all of the above answers are "NO", then may proceed with Cephalosporin use.     Current Outpatient Medications  Medication Sig Dispense Refill   acetaminophen (TYLENOL) 325 MG tablet Take 2 tablets (650 mg total) by mouth every 6 (six) hours as needed. 60 tablet 0   aspirin 81 MG tablet Take 81 mg by mouth daily.     atorvastatin (LIPITOR) 20 MG tablet One by mouth at bedtime three nights a week (Mon, Wed, Fri) 39 tablet 1   Cholecalciferol (VITAMIN  D) 2000 units tablet Take 2,000 Units by mouth 3 (three) times a week. One by mouth twice a week     ezetimibe (ZETIA) 10 MG tablet Take 1 tablet (10 mg total) by mouth daily. 30 tablet 11   ferrous sulfate 325 (65 FE) MG EC tablet Take 325 mg by mouth 3 (three) times a week.      fluticasone (FLONASE) 50 MCG/ACT nasal spray Place 2 sprays into both nostrils daily as needed for allergies or rhinitis. 16 g 11   levocetirizine (XYZAL) 5 MG tablet Take 0.5 tablets (2.5 mg total) by mouth every evening. 15 tablet 11   Multiple Vitamin (MULTIVITAMIN WITH MINERALS) TABS tablet Take 1 tablet by mouth 3 (three) times a week.      ondansetron (ZOFRAN ODT) 4 MG disintegrating tablet Take 1 tablet (4 mg total) by mouth every 8 (eight) hours as needed for nausea or vomiting. 20 tablet 0   Polyethyl Glycol-Propyl Glycol (SYSTANE OP) Place 1 drop into both eyes daily as needed (dry eyes).      sodium chloride (OCEAN) 0.65 % SOLN nasal spray Place 1 spray into both nostrils as needed for congestion. 60 mL 11   No current facility-administered medications for this visit.  Facility-Administered Medications Ordered in Other Visits  Medication Dose Route Frequency Provider Last Rate Last Dose   heparin lock flush 100 unit/mL  500 Units Intravenous Once Grayland Ormond Kathlene November, MD        OBJECTIVE: There were no vitals filed for this visit.   There is no height or weight on file to calculate BMI.    ECOG FS:0 - Asymptomatic   LAB RESULTS:  Lab Results  Component Value Date   NA 143 01/06/2019   K 4.4 01/06/2019   CL 110 10/24/2018   CO2 23 10/24/2018   GLUCOSE 82 10/24/2018   BUN 20 01/06/2019   CREATININE 1.3 (A) 01/06/2019   CALCIUM 9.6 10/24/2018   PROT 7.0 10/24/2018   ALBUMIN 3.7 01/15/2018   AST 18 01/06/2019   ALT 15 01/06/2019   ALKPHOS 108 01/15/2018   BILITOT 0.4 10/24/2018   GFRNONAA 39 (L) 10/24/2018   GFRAA 45 (L) 10/24/2018    Lab Results  Component Value Date   WBC 5.4  10/24/2018   NEUTROABS 2,113 06/21/2018   HGB 12.6 01/06/2019   HCT 37.6 10/24/2018   MCV 92.6 10/24/2018   PLT 214 01/06/2019     STUDIES: US Renal  Result Date: 02/09/2019 CLINICAL DATA:  Chronic kidney disease. EXAM: RENAL / URINARY TRACT ULTRASOUND COMPLETE COMPARISON:  Ultrasound 06/09/2017 and CT 04/02/2017 FINDINGS: Right Kidney: Renal measurements: 8.5 x 4.0 x 5.0 cm = volume: 88 mL. Increased cortical echogenicity. Two cysts are present with the larger measuring 2.3 cm over the upper pole. No hydronephrosis. Left Kidney: Renal measurements: 9.6 x 5.6 x 4.8 cm = volume: 135 mL. Increased cortical echogenicity. No hydronephrosis. Several tiny subcentimeter cysts. Bladder: Appears normal for degree of bladder distention. IMPRESSION: Normal size kidneys with increased cortical echogenicity which may be seen in medical renal disease. No hydronephrosis. Bilateral renal cysts. Electronically Signed   By: Marin Olp M.D.   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  PLAN:    1. 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 She completed neoadjuvant Taxotere, carboplatinum, Herceptin, and Perjeta on February 17, 2017. She had a total mastectomy on April 14, 2017, therefore did not require adjuvant XRT.  She completed her year long maintenance Herceptin on November 03, 2017.  An aromatase inhibitor would not offer benefit given the ER/PR negativity of her disease.  No further intervention is needed.  Patient's most recent mammogram on September 26, 2018 was reported as BI-RADS 1.  Repeat in March 2021.  Patient also had a CT scan of the chest on September 19, 2018 did not reveal any evidence of disease.  Return to clinic in 6 months  for routine evaluation.   2. Anemia: Resolved. 3. Renal insufficiency: Creatinine is elevated, but appears approximately her baseline.  Continue follow-up with nephrology as scheduled. 4.  Lymphedema: Significantly improved.  Continue follow-up and treatment per lymphedema clinic. 5.  Arthritis: Patient does not complain of this today.  Continue follow-up and treatment per PCP.  I provided 15 minutes of non face-to-face telephone visit time during this encounter, and > 50% was spent counseling as documented under my assessment & plan.   Patient expressed understanding and was in agreement with this plan. She also understands  that She can call clinic at any time with any questions, concerns, or complaints.   Cancer Staging Malignant neoplasm of upper-outer quadrant of left female breast [facility]) Staging form: Breast, AJCC 8th Edition - Clinical stage from 10/11/2016: Stage IIB (cT2, cN1, cM0, G3, ER: Negative, PR: Negative, HER2: Positive) - Signed by Lloyd Huger, MD on 10/11/2016 - Pathologic stage from 04/23/2017: No Stage Recommended (ypT0, pN0, cM0, G3, ER: Negative, PR: Negative, HER2: Positive) - Signed by Lloyd Huger, MD on 04/23/2017   Lloyd Huger, MD   02/16/2019 9:36 AM

## 2019-02-15 ENCOUNTER — Inpatient Hospital Stay: Payer: Medicare Other | Attending: Oncology | Admitting: Oncology

## 2019-02-15 ENCOUNTER — Other Ambulatory Visit: Payer: Self-pay

## 2019-02-15 ENCOUNTER — Encounter: Payer: Self-pay | Admitting: Oncology

## 2019-02-15 DIAGNOSIS — C50412 Malignant neoplasm of upper-outer quadrant of left female breast: Secondary | ICD-10-CM | POA: Diagnosis not present

## 2019-02-15 DIAGNOSIS — Z171 Estrogen receptor negative status [ER-]: Secondary | ICD-10-CM

## 2019-02-15 DIAGNOSIS — Z9221 Personal history of antineoplastic chemotherapy: Secondary | ICD-10-CM

## 2019-02-15 DIAGNOSIS — Z9012 Acquired absence of left breast and nipple: Secondary | ICD-10-CM | POA: Diagnosis not present

## 2019-02-15 DIAGNOSIS — Z923 Personal history of irradiation: Secondary | ICD-10-CM | POA: Diagnosis not present

## 2019-02-15 DIAGNOSIS — Z79899 Other long term (current) drug therapy: Secondary | ICD-10-CM

## 2019-02-15 DIAGNOSIS — Z7982 Long term (current) use of aspirin: Secondary | ICD-10-CM

## 2019-02-15 NOTE — Progress Notes (Signed)
Patient stated that she had been doing well with no complaints. Patient's last mammogram was done on 09/26/2018.

## 2019-02-17 ENCOUNTER — Encounter: Payer: Self-pay | Admitting: Family Medicine

## 2019-02-17 ENCOUNTER — Ambulatory Visit (INDEPENDENT_AMBULATORY_CARE_PROVIDER_SITE_OTHER): Payer: Medicare Other | Admitting: Family Medicine

## 2019-02-17 ENCOUNTER — Other Ambulatory Visit: Payer: Self-pay

## 2019-02-17 ENCOUNTER — Ambulatory Visit: Payer: Medicare Other | Admitting: Family Medicine

## 2019-02-17 VITALS — BP 124/62 | HR 98 | Temp 97.5°F | Resp 16 | Ht 61.0 in | Wt 173.2 lb

## 2019-02-17 DIAGNOSIS — I890 Lymphedema, not elsewhere classified: Secondary | ICD-10-CM

## 2019-02-17 DIAGNOSIS — Z23 Encounter for immunization: Secondary | ICD-10-CM | POA: Diagnosis not present

## 2019-02-17 DIAGNOSIS — N183 Chronic kidney disease, stage 3 unspecified: Secondary | ICD-10-CM

## 2019-02-17 DIAGNOSIS — I700 Atherosclerosis of aorta: Secondary | ICD-10-CM

## 2019-02-17 DIAGNOSIS — G4700 Insomnia, unspecified: Secondary | ICD-10-CM | POA: Diagnosis not present

## 2019-02-17 DIAGNOSIS — M25561 Pain in right knee: Secondary | ICD-10-CM

## 2019-02-17 DIAGNOSIS — Z9012 Acquired absence of left breast and nipple: Secondary | ICD-10-CM

## 2019-02-17 DIAGNOSIS — E538 Deficiency of other specified B group vitamins: Secondary | ICD-10-CM

## 2019-02-17 DIAGNOSIS — E7800 Pure hypercholesterolemia, unspecified: Secondary | ICD-10-CM | POA: Diagnosis not present

## 2019-02-17 DIAGNOSIS — M25571 Pain in right ankle and joints of right foot: Secondary | ICD-10-CM

## 2019-02-17 DIAGNOSIS — Z9181 History of falling: Secondary | ICD-10-CM

## 2019-02-17 DIAGNOSIS — K219 Gastro-esophageal reflux disease without esophagitis: Secondary | ICD-10-CM

## 2019-02-17 DIAGNOSIS — M791 Myalgia, unspecified site: Secondary | ICD-10-CM

## 2019-02-17 MED ORDER — FAMOTIDINE 20 MG PO TABS: 20.0000 mg | ORAL_TABLET | Freq: Every day | ORAL | 0 refills | Status: DC | PRN

## 2019-02-17 MED ORDER — MELOXICAM 15 MG PO TABS: 15.0000 mg | ORAL_TABLET | Freq: Every day | ORAL | 0 refills | Status: DC

## 2019-02-17 MED ORDER — BACLOFEN 10 MG PO TABS: 10.0000 mg | ORAL_TABLET | Freq: Three times a day (TID) | ORAL | 0 refills | Status: DC | PRN

## 2019-02-17 NOTE — Patient Instructions (Addendum)
Take Meloxicam for only one week and stop because it will affect your kidney function, after that only prn maybe at most once a week  Famotine also prn only

## 2019-02-17 NOTE — Progress Notes (Signed)
Name: Maria Patterson   MRN: [id number]    DOB: 04/23/1941   Date:02/17/2019       Progress Note  Subjective  Chief Complaint  Chief Complaint  Patient presents with  . Fall    [place] going to get a drink out of her kitchen and slipped on something wet.   . Foot Pain    Onset-3 weeks ago, her right foot swell and the pain radiates up her right hip. Has tried Tylenol with no help    HPI  Recent Fall: patient states she was at home and slipped on her kitchen floor, twisted her right ankle. She had a virtual visit with Raelyn Ensign NP two days later - on July 8th,2020.She was advised to have X-rays of her foot but she refused at the time. She states since the fall the bruise on her foot has improved, but she continues to have antalgic gait, has effusion of right knee and difficulty bearing weight on right side. She has been applying ice and elevating.   CKI stage III: under the care of Dr. Candiss Norse, had US renal recently, explained that nsaid's are not really indicated in [facility] for long term use but we can try a short term course to decrease inflammation. She has good urine output and denies pruritus.   Aorta Atherosclerosis: found on CT, she is on statin therapy but only a few times a week and Zetia, denies side effects, but states does not like taking medications daily  History of left mastectomy: she still under the care of oncologist , has lymphedema of left upper extremity and is using a arm sleeve to control symptoms.   GERD: she states she has intermittent heartburn, it is usually a couple times a week and causes some nausea, triggered by fried food or spicy food. She is not on medications  Patient Active Problem List   Diagnosis Date Noted  . H/O total mastectomy of left breast 06/03/2018  . Obesity (BMI 30.0-34.9) 03/08/2018  . Insomnia 09/26/2017  . Chronic renal disease, stage III ([place]) 08/09/2017  . Hypomagnesemia 05/04/2017  . Aortic atherosclerosis ([place]) 05/04/2017  . Compression  fracture of first lumbar vertebra ([place]) 05/04/2017  . Degenerative disc disease, lumbar 05/04/2017  . Anemia 02/01/2017  . Atherosclerosis of native arteries of extremity with intermittent claudication ([place]) 11/09/2016  . Allergic rhinitis 11/07/2016  . Malignant neoplasm of upper-outer quadrant of left female breast ([place]) 10/08/2016  . Postmenopausal 09/18/2016  . Ache in joint 03/09/2016  . GERD without esophagitis 07/01/2012  . Hyperlipidemia LDL goal <100 07/01/2012    Past Surgical History:  Procedure Laterality Date  . ABDOMINAL HYSTERECTOMY    . AXILLARY SENTINEL NODE BIOPSY Left 04/14/2017   Procedure: AXILLARY SENTINEL NODE BIOPSY;  Surgeon: Olean Ree, MD;  Location: [facility] ORS;  Service: General;  Laterality: Left;  . BREAST BIOPSY Left 09/30/2016   US biopsy of 3 areas + chemo  . BREAST EXCISIONAL BIOPSY Left 04/14/2017   Left breast invasive cancer left mastectomy  . ESOPHAGOGASTRODUODENOSCOPY (EGD) WITH PROPOFOL N/A 12/24/2014   Procedure: ESOPHAGOGASTRODUODENOSCOPY (EGD) WITH PROPOFOL;  Surgeon: Manya Silvas, MD;  Location: [facility] ENDOSCOPY;  Service: Endoscopy;  Laterality: N/A;  . MASTECTOMY Left 04/14/2017   Left breast invasive cancer  . PORTACATH PLACEMENT Right 10/27/2016   Procedure: INSERTION PORT-A-CATH;  Surgeon: Nestor Lewandowsky, MD;  Location: [facility] ORS;  Service: General;  Laterality: Right;  . TONSILECTOMY, ADENOIDECTOMY, BILATERAL MYRINGOTOMY AND TUBES    . TONSILLECTOMY    .  TOTAL MASTECTOMY Left 04/14/2017   Procedure: TOTAL MASTECTOMY;  Surgeon: Olean Ree, MD;  Location: [facility] ORS;  Service: General;  Laterality: Left;    Family History  Problem Relation Age of Onset  . Stroke Mother   . Stroke Father   . Cancer Neg Hx   . Depression Neg Hx   . Breast cancer Neg Hx     Social History   Socioeconomic History  . Marital status: Widowed    Spouse name: Not on file  . Number of children: 1  . Years of education: GED  . Highest education level:  12th grade  Occupational History  . Occupation: Retired  Scientific laboratory technician  . Financial resource strain: Somewhat hard  . Food insecurity    Worry: Sometimes true    Inability: Sometimes true  . Transportation needs    Medical: No    Non-medical: No  Tobacco Use  . Smoking status: Never Smoker  . Smokeless tobacco: Never Used  . Tobacco comment: smoking cessation materials not required  Substance and Sexual Activity  . Alcohol use: No    Alcohol/week: 0.0 standard drinks  . Drug use: No  . Sexual activity: Not Currently  Lifestyle  . Physical activity    Days per week: 0 days    Minutes per session: 0 min  . Stress: Not at all  Relationships  . Social connections    Talks on phone: More than three times a week    Gets together: More than three times a week    Attends religious service: More than 4 times per year    Active member of club or organization: No    Attends meetings of clubs or organizations: Never    Relationship status: Widowed  . Intimate partner violence    Fear of current or ex partner: No    Emotionally abused: No    Physically abused: No    Forced sexual activity: No  Other Topics Concern  . Not on file  Social History Narrative  . Not on file     Current Outpatient Medications:  .  acetaminophen (TYLENOL) 325 MG tablet, Take 2 tablets (650 mg total) by mouth every 6 (six) hours as needed., Disp: 60 tablet, Rfl: 0 .  aspirin 81 MG tablet, Take 81 mg by mouth daily., Disp: , Rfl:  .  atorvastatin (LIPITOR) 20 MG tablet, One by mouth at bedtime three nights a week (Mon, Wed, Fri), Disp: 39 tablet, Rfl: 1 .  Cholecalciferol (VITAMIN D) 2000 units tablet, Take 2,000 Units by mouth 3 (three) times a week. One by mouth twice a week, Disp: , Rfl:  .  ezetimibe (ZETIA) 10 MG tablet, Take 1 tablet (10 mg total) by mouth daily., Disp: 30 tablet, Rfl: 11 .  ferrous sulfate 325 (65 FE) MG EC tablet, Take 325 mg by mouth 3 (three) times a week. , Disp: , Rfl:  .   fluticasone (FLONASE) 50 MCG/ACT nasal spray, Place 2 sprays into both nostrils daily as needed for allergies or rhinitis., Disp: 16 g, Rfl: 11 .  levocetirizine (XYZAL) 5 MG tablet, Take 0.5 tablets (2.5 mg total) by mouth every evening., Disp: 15 tablet, Rfl: 11 .  Multiple Vitamin (MULTIVITAMIN WITH MINERALS) TABS tablet, Take 1 tablet by mouth 3 (three) times a week. , Disp: , Rfl:  .  Polyethyl Glycol-Propyl Glycol (SYSTANE OP), Place 1 drop into both eyes daily as needed (dry eyes). , Disp: , Rfl:  .  sodium chloride (  OCEAN) 0.65 % SOLN nasal spray, Place 1 spray into both nostrils as needed for congestion., Disp: 60 mL, Rfl: 11 .  baclofen (LIORESAL) 10 MG tablet, Take 1 tablet (10 mg total) by mouth 3 (three) times daily as needed for muscle spasms., Disp: 40 each, Rfl: 0 .  famotidine (PEPCID) 20 MG tablet, Take 1 tablet (20 mg total) by mouth daily as needed for heartburn or indigestion., Disp: 90 tablet, Rfl: 0 .  meloxicam (MOBIC) 15 MG tablet, Take 1 tablet (15 mg total) by mouth daily., Disp: 30 tablet, Rfl: 0  Allergies  Allergen Reactions  . Latex Itching  . Penicillins Itching and Rash    Has patient had a PCN reaction causing immediate rash, facial/tongue/throat swelling, SOB or lightheadedness with hypotension: Yes Has patient had a PCN reaction causing severe rash involving mucus membranes or skin necrosis: No Has patient had a PCN reaction that required hospitalization No Has patient had a PCN reaction occurring within the last 10 years: No If all of the above answers are "NO", then may proceed with Cephalosporin use.     I personally reviewed active problem list, medication list, allergies, family history, social history with the patient/caregiver today.   ROS  Ten systems reviewed and is negative except as mentioned in HPI   Objective  Vitals:   02/17/19 1548  BP: 124/62  Pulse: 98  Resp: 16  Temp: (!) 97.5 F (36.4 C)  TempSrc: Temporal  SpO2: 95%   Weight: 173 lb 3.2 oz (78.6 kg)  Height: 5\' 1"  (1.549 m)    Body mass index is 32.73 kg/m.  Physical Exam  Constitutional: Patient appears well-developed and well-nourished. Obese No distress.  HEENT: head atraumatic, normocephalic, pupils equal and reactive to light,neck supple Cardiovascular: Normal rate, regular rhythm and normal heart sounds.  No murmur heard. No BLE edema. Pulmonary/Chest: Effort normal and breath sounds normal. No respiratory distress. Abdominal: Soft.  There is no tenderness. Psychiatric: Patient has a normal mood and affect. behavior is normal. Judgment and thought content normal. Muscular Skeletal: crepitus and pain with extension of right knee, difficulty bearing weight and has antalgic gait, mild effusion right knee, no redness or increase in warmth, pain during palpation of foot and ankle but no swelling or specific point tenderness, pain when she stretches hamstring   Recent Results (from the past 2160 hour(s))  CBC and differential     Status: None   Collection Time: 01/06/19 12:00 AM  Result Value Ref Range   Hemoglobin 12.6 12.0 - 16.0   Platelets 214 150 - 399  VITAMIN D 25 Hydroxy (Vit-D Deficiency, Fractures)     Status: None   Collection Time: 01/06/19 12:00 AM  Result Value Ref Range   Vit D, 25-Hydroxy 30   Basic metabolic panel     Status: Abnormal   Collection Time: 01/06/19 12:00 AM  Result Value Ref Range   Glucose 82    BUN 20 4 - 21   Creatinine 1.3 (A) 0.5 - 1.1   Potassium 4.4 3.4 - 5.3   Sodium 143 137 - 147  Lipid panel     Status: Abnormal   Collection Time: 01/06/19 12:00 AM  Result Value Ref Range   Triglycerides 136 40 - 160   Cholesterol 282 (A) 0 - 200   HDL 70 35 - 70   LDL Cholesterol 185   Hepatic function panel     Status: None   Collection Time: 01/06/19 12:00 AM  Result Value  Ref Range   ALT 15 7 - 35   AST 18 13 - 35   Bilirubin, Total 7.0   Vitamin B12     Status: None   Collection Time: 01/06/19 12:00  AM  Result Value Ref Range   Vitamin B-12 499      PHQ2/9: Depression screen [facility] 2/9 02/17/2019 01/25/2019 10/21/2018 06/29/2018 06/21/2018  Decreased Interest 3 0 0 0 0  Down, Depressed, Hopeless 0 0 0 0 0  PHQ - 2 Score 3 0 0 0 0  Altered sleeping 0 0 0 0 0  Tired, decreased energy 0 0 0 0 0  Change in appetite 0 0 0 0 0  Feeling bad or failure about yourself  0 0 0 0 0  Trouble concentrating 0 0 0 0 0  Moving slowly or fidgety/restless 0 0 0 0 0  Suicidal thoughts 0 0 0 0 0  PHQ-9 Score 3 0 0 0 0  Difficult doing work/chores Not difficult at all Not difficult at all Not difficult at all Not difficult at all Not difficult at all  Some recent data might be hidden    phq 9 is negative, secondary to inability to move because of pain    Fall Risk: Fall Risk  02/17/2019 01/25/2019 10/21/2018 10/05/2018 06/29/2018  Falls in the past year? 1 1 0 0 1  Comment - - - - -  Number falls in past yr: 0 0 - 0 1  Injury with Fall? 1 1 - - 0  Risk for fall due to : - - - - -  Risk for fall due to: Comment - - - - -  Follow up - - - Falls evaluation completed -     Functional Status Survey: Is the patient deaf or have difficulty hearing?: No Does the patient have difficulty seeing, even when wearing glasses/contacts?: Yes Does the patient have difficulty concentrating, remembering, or making decisions?: No Does the patient have difficulty walking or climbing stairs?: Yes Does the patient have difficulty dressing or bathing?: No Does the patient have difficulty doing errands alone such as visiting a doctor's office or shopping?: No   Assessment & Plan  1. Need for Tdap vaccination  - Tdap vaccine greater than or equal to 7yo IM  2. Vitamin B12 deficiency  Taking supplementation otc   3. Aortic atherosclerosis (HCC)  - Lipid panel  4. Insomnia, unspecified type  No longer having problems  5. GERD without esophagitis  - famotidine (PEPCID) 20 MG tablet; Take 1 tablet (20 mg total)  by mouth daily as needed for heartburn or indigestion.  Dispense: 90 tablet; Refill: 0, she has mild intermittent symptoms she will only take it prn, she is aware it can affect her kidney function also   6. Chronic renal disease, stage III (HCC)  - COMPLETE METABOLIC PANEL WITH GFR  7. H/O total mastectomy of left breast   8. Pure hypercholesterolemia  - Lipid panel  9. Lymphedema of left arm   10. History of recent fall  - Ambulatory referral to Orthopedic Surgery - meloxicam (MOBIC) 15 MG tablet; Take 1 tablet (15 mg total) by mouth daily.  Dispense: 30 tablet; Refill: 0 - baclofen (LIORESAL) 10 MG tablet; Take 1 tablet (10 mg total) by mouth 3 (three) times daily as needed for muscle spasms.  Dispense: 40 each; Refill: 0  11. Acute right ankle pain  - Ambulatory referral to Orthopedic Surgery - meloxicam (MOBIC) 15 MG tablet; Take 1 tablet (  15 mg total) by mouth daily.  Dispense: 30 tablet; Refill: 0, explained risk associated with meloxicam and CKI, take it for one week after that very seldom and only prn  - baclofen (LIORESAL) 10 MG tablet; Take 1 tablet (10 mg total) by mouth 3 (three) times daily as needed for muscle spasms.  Dispense: 40 each; Refill: 0  12. Acute pain of right knee  - Ambulatory referral to Orthopedic Surgery - meloxicam (MOBIC) 15 MG tablet; Take 1 tablet (15 mg total) by mouth daily.  Dispense: 30 tablet; Refill: 0 - baclofen (LIORESAL) 10 MG tablet; Take 1 tablet (10 mg total) by mouth 3 (three) times daily as needed for muscle spasms.  Dispense: 40 each; Refill: 0  13. Muscle pain  - Ambulatory referral to Orthopedic Surgery - meloxicam (MOBIC) 15 MG tablet; Take 1 tablet (15 mg total) by mouth daily.  Dispense: 30 tablet; Refill: 0 - baclofen (LIORESAL) 10 MG tablet; Take 1 tablet (10 mg total) by mouth 3 (three) times daily as needed for muscle spasms.  Dispense: 40 each; Refill: 0

## 2019-02-18 LAB — COMPLETE METABOLIC PANEL WITH GFR
AG Ratio: 1.5 (calc) (ref 1.0–2.5)
ALT: 12 U/L (ref 6–29)
AST: 17 U/L (ref 10–35)
Albumin: 3.7 g/dL (ref 3.6–5.1)
Alkaline phosphatase (APISO): 82 U/L (ref 37–153)
BUN/Creatinine Ratio: 15 (calc) (ref 6–22)
BUN: 20 mg/dL (ref 7–25)
CO2: 22 mmol/L (ref 20–32)
Calcium: 9.4 mg/dL (ref 8.6–10.4)
Chloride: 110 mmol/L (ref 98–110)
Creat: 1.34 mg/dL — ABNORMAL HIGH (ref 0.60–0.93)
GFR, Est African American: 44 mL/min/{1.73_m2} — ABNORMAL LOW (ref >=60)
GFR, Est Non African American: 38 mL/min/{1.73_m2} — ABNORMAL LOW (ref >=60)
Globulin: 2.5 g/dL (calc) (ref 1.9–3.7)
Glucose, Bld: 105 mg/dL — ABNORMAL HIGH (ref 65–99)
Potassium: 4 mmol/L (ref 3.5–5.3)
Sodium: 143 mmol/L (ref 135–146)
Total Bilirubin: 0.3 mg/dL (ref 0.2–1.2)
Total Protein: 6.2 g/dL (ref 6.1–8.1)

## 2019-02-18 LAB — LIPID PANEL
Cholesterol: 293 mg/dL — ABNORMAL HIGH (ref <200)
HDL: 60 mg/dL (ref >=50)
LDL Cholesterol (Calc): 197 mg/dL (calc) — ABNORMAL HIGH
Non-HDL Cholesterol (Calc): 233 mg/dL (calc) — ABNORMAL HIGH (ref <130)
Total CHOL/HDL Ratio: 4.9 (calc) (ref <5.0)
Triglycerides: 184 mg/dL — ABNORMAL HIGH (ref <150)

## 2019-02-19 ENCOUNTER — Encounter: Payer: Self-pay | Admitting: Family Medicine

## 2019-02-19 DIAGNOSIS — R7303 Prediabetes: Secondary | ICD-10-CM | POA: Insufficient documentation

## 2019-03-01 NOTE — Progress Notes (Signed)
Radiation Oncology Follow up Note  Name: Maria Patterson   Date:   09/27/2018 MRN:  [id number] DOB: [birth year]    This 78 y.o. 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  REFERRING PROVIDER: Arnetha Courser, MD  HPI: Patient presented with some nodular densities in in her mastectomy scar for which I ordered CT scan..  CT scan showed post left mastectomy with no evidence of local recurrence by CT imaging.  There was also no evidence of supraclavicular or axillary adenopathy.  Patient is otherwise doing well at this time.  COMPLICATIONS OF TREATMENT: none  FOLLOW UP COMPLIANCE: keeps appointments   PHYSICAL EXAM:  BP (!) 155/80 (BP Location: Right Arm, Patient Position: Sitting)   Pulse 74   Temp (!) 97 F (36.1 C) (Tympanic)   Resp 16   Wt 179 lb 2 oz (81.3 kg)   BMI 33.85 kg/m  There is no evidence of disease in her mastectomy scar.  No axillary or supraclavicular adenopathy is detected.  Well-developed well-nourished patient in NAD. HEENT reveals PERLA, EOMI, discs not visualized.  Oral cavity is clear. No oral mucosal lesions are identified. Neck is clear without evidence of cervical or supraclavicular adenopathy. Lungs are clear to A&P. Cardiac examination is essentially unremarkable with regular rate and rhythm without murmur rub or thrill. Abdomen is benign with no organomegaly or masses noted. Motor sensory and DTR levels are equal and symmetric in the upper and lower extremities. Cranial nerves II through XII are grossly intact. Proprioception is intact. No peripheral adenopathy or edema is identified. No motor or sensory levels are noted. Crude visual fields are within normal range.  RADIOLOGY RESULTS: CT scans reviewed compatible with above-stated findings  PLAN: Present time patient is doing  well CT results bear out there is no evidence of recurrent or progressive disease.  I have asked to see her back in 6 months for follow-up.  Patient knows to call with any concerns.  I would like to take this opportunity to thank you for allowing me to participate in the care of your patient.Noreene Filbert, MD

## 2019-03-06 ENCOUNTER — Encounter: Payer: Self-pay | Admitting: Family Medicine

## 2019-03-06 ENCOUNTER — Other Ambulatory Visit: Payer: Self-pay

## 2019-03-06 ENCOUNTER — Ambulatory Visit (INDEPENDENT_AMBULATORY_CARE_PROVIDER_SITE_OTHER): Payer: Medicare Other | Admitting: Family Medicine

## 2019-03-06 DIAGNOSIS — E7800 Pure hypercholesterolemia, unspecified: Secondary | ICD-10-CM

## 2019-03-06 MED ORDER — REPATHA SURECLICK 140 MG/ML ~~LOC~~ SOAJ: 140.0000 mg | SUBCUTANEOUS | 1 refills | Status: DC

## 2019-03-06 NOTE — Progress Notes (Signed)
Name: Maria Patterson   MRN: [id number]    DOB: 1940-08-21   Date:03/06/2019       Progress Note  Subjective  Chief Complaint  Chief Complaint  Patient presents with  . Hyperlipidemia    discuss injection for cholesterol    I connected with  [name] on 03/06/19 at  9:00 AM EDT by telephone and verified that I am speaking with the correct person using two identifiers.   I discussed the limitations, risks, security and privacy concerns of performing an evaluation and management service by telephone and the availability of in person appointments. Staff also discussed with the patient that there may be a patient responsible charge related to this service. Patient Location: Home Provider Location: Home Additional Individuals present: None  HPI  Pt presents to discuss Repatha injection. Her LDL cholesterol has been quite high for over a year now - last check was 197.  Dr. Ancil Boozer, her PCP, recommended Repatha, and patient checked with insurance who did verify coverage.  Discussed common side effects of medications, contraindications, and her need to let us know if she has any rash or injection site reaction.   - She is unsure if she would be able to do injections herself - we will have her fill the prescription and bring to the office for instruction.  If comfortable, we will have her do Q2Week injections at home, if she is not comfortable, we may consider once monthly injections at the office at the 420mg  dosing.  She is agreeable to this.  Patient Active Problem List   Diagnosis Date Noted  . Pre-diabetes 02/19/2019  . H/O total mastectomy of left breast 06/03/2018  . Obesity (BMI 30.0-34.9) 03/08/2018  . Insomnia 09/26/2017  . Chronic renal disease, stage III ([place]) 08/09/2017  . Hypomagnesemia 05/04/2017  . Aortic atherosclerosis ([place]) 05/04/2017  . Compression fracture of first lumbar vertebra ([place]) 05/04/2017  . Degenerative disc disease, lumbar 05/04/2017  . Anemia  02/01/2017  . Atherosclerosis of native arteries of extremity with intermittent claudication ([place]) 11/09/2016  . Allergic rhinitis 11/07/2016  . Malignant neoplasm of upper-outer quadrant of left female breast ([place]) 10/08/2016  . Postmenopausal 09/18/2016  . Ache in joint 03/09/2016  . GERD without esophagitis 07/01/2012  . Hyperlipidemia LDL goal <100 07/01/2012    Social History   Tobacco Use  . Smoking status: Never Smoker  . Smokeless tobacco: Never Used  . Tobacco comment: smoking cessation materials not required  Substance Use Topics  . Alcohol use: No    Alcohol/week: 0.0 standard drinks     Current Outpatient Medications:  .  acetaminophen (TYLENOL) 325 MG tablet, Take 2 tablets (650 mg total) by mouth every 6 (six) hours as needed., Disp: 60 tablet, Rfl: 0 .  aspirin 81 MG tablet, Take 81 mg by mouth daily., Disp: , Rfl:  .  atorvastatin (LIPITOR) 20 MG tablet, One by mouth at bedtime three nights a week (Mon, Wed, Fri), Disp: 39 tablet, Rfl: 1 .  Cholecalciferol (VITAMIN D) 2000 units tablet, Take 2,000 Units by mouth 3 (three) times a week. One by mouth twice a week, Disp: , Rfl:  .  ezetimibe (ZETIA) 10 MG tablet, Take 1 tablet (10 mg total) by mouth daily., Disp: 30 tablet, Rfl: 11 .  famotidine (PEPCID) 20 MG tablet, Take 1 tablet (20 mg total) by mouth daily as needed for heartburn or indigestion., Disp: 90 tablet, Rfl: 0 .  ferrous sulfate 325 (65 FE) MG EC tablet, Take 325  mg by mouth 3 (three) times a week. , Disp: , Rfl:  .  fluticasone (FLONASE) 50 MCG/ACT nasal spray, Place 2 sprays into both nostrils daily as needed for allergies or rhinitis., Disp: 16 g, Rfl: 11 .  levocetirizine (XYZAL) 5 MG tablet, Take 0.5 tablets (2.5 mg total) by mouth every evening., Disp: 15 tablet, Rfl: 11 .  Multiple Vitamin (MULTIVITAMIN WITH MINERALS) TABS tablet, Take 1 tablet by mouth 3 (three) times a week. , Disp: , Rfl:  .  Polyethyl Glycol-Propyl Glycol (SYSTANE OP), Place 1  drop into both eyes daily as needed (dry eyes). , Disp: , Rfl:  .  sodium chloride (OCEAN) 0.65 % SOLN nasal spray, Place 1 spray into both nostrils as needed for congestion., Disp: 60 mL, Rfl: 11 .  baclofen (LIORESAL) 10 MG tablet, Take 1 tablet (10 mg total) by mouth 3 (three) times daily as needed for muscle spasms. (Patient not taking: Reported on 03/06/2019), Disp: 40 each, Rfl: 0 .  meloxicam (MOBIC) 15 MG tablet, Take 1 tablet (15 mg total) by mouth daily. (Patient not taking: Reported on 03/06/2019), Disp: 30 tablet, Rfl: 0  Allergies  Allergen Reactions  . Latex Itching  . Penicillins Itching and Rash    Has patient had a PCN reaction causing immediate rash, facial/tongue/throat swelling, SOB or lightheadedness with hypotension: Yes Has patient had a PCN reaction causing severe rash involving mucus membranes or skin necrosis: No Has patient had a PCN reaction that required hospitalization No Has patient had a PCN reaction occurring within the last 10 years: No If all of the above answers are "NO", then may proceed with Cephalosporin use.     I personally reviewed active problem list, medication list, allergies, notes from last encounter, lab results with the patient/caregiver today.  ROS  Constitutional: Negative for fever or weight change.  Respiratory: Negative for cough and shortness of breath.   Cardiovascular: Negative for chest pain or palpitations.  Gastrointestinal: Negative for abdominal pain, no bowel changes.  Musculoskeletal: Negative for gait problem or joint swelling.  Skin: Negative for rash.  Neurological: Negative for dizziness or headache.  No other specific complaints in a complete review of systems (except as listed in HPI above).  Objective  Virtual encounter, vitals not obtained.  There is no height or weight on file to calculate BMI.  Nursing Note and Vital Signs reviewed.  Physical Exam  Pulmonary/Chest: Effort normal. No respiratory distress.  Speaking in complete sentences Neurological: Pt is alert and oriented to person, place, and time. Speech is normal Psychiatric: Patient has a normal mood and affect. behavior is normal. Judgment and thought content normal.   No results found for this or any previous visit (from the past 72 hour(s)).  Assessment & Plan  1. Pure hypercholesterolemia - she will schedule a nurse visit for her first injection to ensure correct technique.  If unable to perform self-injection, consider once monthly dosing in office. - Evolocumab (REPATHA SURECLICK) 938 MG/ML SOAJ; Inject 140 mg into the skin every 14 (fourteen) days.  Dispense: 6 pen; Refill: 1  - I discussed the assessment and treatment plan with the patient. The patient was provided an opportunity to ask questions and all were answered. The patient agreed with the plan and demonstrated an understanding of the instructions.  - The patient was advised to call back or seek an in-person evaluation if the symptoms worsen or if the condition fails to improve as anticipated.  I provided 13 minutes of non-face-to-face  time during this encounter.  Hubbard Hartshorn, FNP

## 2019-03-14 ENCOUNTER — Other Ambulatory Visit: Payer: Self-pay

## 2019-03-14 ENCOUNTER — Ambulatory Visit: Payer: Medicare Other | Admitting: Family Medicine

## 2019-03-17 ENCOUNTER — Ambulatory Visit (INDEPENDENT_AMBULATORY_CARE_PROVIDER_SITE_OTHER): Payer: Medicare Other | Admitting: Internal Medicine

## 2019-03-17 ENCOUNTER — Encounter: Payer: Self-pay | Admitting: Internal Medicine

## 2019-03-17 DIAGNOSIS — E785 Hyperlipidemia, unspecified: Secondary | ICD-10-CM | POA: Diagnosis not present

## 2019-03-17 DIAGNOSIS — K635 Polyp of colon: Secondary | ICD-10-CM

## 2019-03-17 DIAGNOSIS — M25562 Pain in left knee: Secondary | ICD-10-CM | POA: Diagnosis not present

## 2019-03-17 DIAGNOSIS — M7989 Other specified soft tissue disorders: Secondary | ICD-10-CM | POA: Diagnosis not present

## 2019-03-17 DIAGNOSIS — M25561 Pain in right knee: Secondary | ICD-10-CM | POA: Diagnosis not present

## 2019-03-17 DIAGNOSIS — K219 Gastro-esophageal reflux disease without esophagitis: Secondary | ICD-10-CM

## 2019-03-17 DIAGNOSIS — R739 Hyperglycemia, unspecified: Secondary | ICD-10-CM

## 2019-03-17 DIAGNOSIS — G8929 Other chronic pain: Secondary | ICD-10-CM | POA: Diagnosis not present

## 2019-03-17 DIAGNOSIS — Z0289 Encounter for other administrative examinations: Secondary | ICD-10-CM

## 2019-03-17 HISTORY — DX: Polyp of colon: K63.5

## 2019-03-17 MED ORDER — PANTOPRAZOLE SODIUM 20 MG PO TBEC: 20.0000 mg | DELAYED_RELEASE_TABLET | Freq: Every day | ORAL | 3 refills | Status: DC

## 2019-03-17 NOTE — Patient Instructions (Signed)

## 2019-03-17 NOTE — Progress Notes (Signed)
Telephone Note  I connected with Maria Patterson   on 03/17/19 at  8:15 AM EDT telephone and verified that I am speaking with the correct person using two identifiers.  Location patient: home Location provider:work or home office Persons participating in the virtual visit: patient, provider  I discussed the limitations of evaluation and management by telemedicine and the availability of in person appointments. The patient expressed understanding and agreed to proceed.   HPI: 1. GERD wants refill of protonix  2. HLD taking lipitor qod, zetia 10 mg, repatha never picked up  3. H/o left breast cancer s/p mastectomy pending mammo right breast 4. CKD 3 will  Monitor  5. C/o leg cramps and right and left knee pain mild to moderate she stopped mobic and baclofen b/c she did not like the way it made her feel   ROS: See pertinent positives and negatives per HPI. General: no wt change  HEENT: no sore throat  CV: no chest pain, +right leg swelling  Lungs: no sob  GI: no ab pain  GU: no issues  MSK: knee pain b/l  Neuro: no h/a  SkIN; no issues  Psych: no anxiety/depression   Past Medical History:  Diagnosis Date  . Anemia   . Breast cancer ([place]) 2018   Left  . Breast mass, left 09/22/2016   RECOMMENDATION: Ultrasound-guided core biopsies of masses at left breast 2 o'clock 2 cm from nipple, left breast 2 o'clock 5 cm from nipple, abnormal left axillary lymph nodes.  Marland Kitchen GERD (gastroesophageal reflux disease)   . Hyperglycemia   . Hyperlipidemia   . Obesity   . Personal history of chemotherapy 2018   Left breast  . Personal history of radiation therapy 2018  . Postmenopausal     Past Surgical History:  Procedure Laterality Date  . ABDOMINAL HYSTERECTOMY    . AXILLARY SENTINEL NODE BIOPSY Left 04/14/2017   Procedure: AXILLARY SENTINEL NODE BIOPSY;  Surgeon: Olean Ree, MD;  Location: [facility] ORS;  Service: General;  Laterality: Left;  . BREAST BIOPSY Left 09/30/2016   US biopsy of 3  areas + chemo  . BREAST EXCISIONAL BIOPSY Left 04/14/2017   Left breast invasive cancer left mastectomy  . ESOPHAGOGASTRODUODENOSCOPY (EGD) WITH PROPOFOL N/A 12/24/2014   Procedure: ESOPHAGOGASTRODUODENOSCOPY (EGD) WITH PROPOFOL;  Surgeon: Manya Silvas, MD;  Location: [facility] END[facility];  Service: Endoscopy;  Laterality: N/A;  . MASTECTOMY Left 04/14/2017   Left breast invasive cancer  . PORTACATH PLACEMENT Right 10/27/2016   Procedure: INSERTION PORT-A-CATH;  Surgeon: Nestor Lewandowsky, MD;  Location: [facility] ORS;  Service: General;  Laterality: Right;  . TONSILECTOMY, ADENOIDECTOMY, BILATERAL MYRINGOTOMY AND TUBES    . TONSILLECTOMY    . TOTAL MASTECTOMY Left 04/14/2017   Procedure: TOTAL MASTECTOMY;  Surgeon: Olean Ree, MD;  Location: [facility] ORS;  Service: General;  Laterality: Left;    Family History  Problem Relation Age of Onset  . Stroke Mother   . Stroke Father   . Cancer Neg Hx   . Depression Neg Hx   . Breast cancer Neg Hx     SOCIAL HX:  Lives at home    Current Outpatient Medications:  .  acetaminophen (TYLENOL) 325 MG tablet, Take 2 tablets (650 mg total) by mouth every 6 (six) hours as needed., Disp: 60 tablet, Rfl: 0 .  aspirin 81 MG tablet, Take 81 mg by mouth daily., Disp: , Rfl:  .  atorvastatin (LIPITOR) 20 MG tablet, One by mouth at bedtime three nights a week (  Mon, Wed, Fri), Disp: 39 tablet, Rfl: 1 .  Cholecalciferol (VITAMIN D) 2000 units tablet, Take 2,000 Units by mouth 3 (three) times a week. One by mouth twice a week, Disp: , Rfl:  .  Evolocumab (REPATHA SURECLICK) XX123456 MG/ML SOAJ, Inject 140 mg into the skin every 14 (fourteen) days., Disp: 6 pen, Rfl: 1 .  ezetimibe (ZETIA) 10 MG tablet, Take 1 tablet (10 mg total) by mouth daily., Disp: 30 tablet, Rfl: 11 .  ferrous sulfate 325 (65 FE) MG EC tablet, Take 325 mg by mouth 3 (three) times a week. , Disp: , Rfl:  .  fluticasone (FLONASE) 50 MCG/ACT nasal spray, Place 2 sprays into both nostrils daily as needed for  allergies or rhinitis., Disp: 16 g, Rfl: 11 .  levocetirizine (XYZAL) 5 MG tablet, Take 0.5 tablets (2.5 mg total) by mouth every evening., Disp: 15 tablet, Rfl: 11 .  Multiple Vitamin (MULTIVITAMIN WITH MINERALS) TABS tablet, Take 1 tablet by mouth 3 (three) times a week. , Disp: , Rfl:  .  pantoprazole (PROTONIX) 20 MG tablet, Take 1 tablet (20 mg total) by mouth daily. 30 minutes before food, Disp: 90 tablet, Rfl: 3 .  Polyethyl Glycol-Propyl Glycol (SYSTANE OP), Place 1 drop into both eyes daily as needed (dry eyes). , Disp: , Rfl:  .  sodium chloride (OCEAN) 0.65 % SOLN nasal spray, Place 1 spray into both nostrils as needed for congestion., Disp: 60 mL, Rfl: 11  EXAM:  VITALS per patient if applicable:  GENERAL: alert, oriented, appears well and in no acute distress  HEENT: atraumatic, conjunttiva clear, no obvious abnormalities on inspection of external nose and ears  NECK: normal movements of the head and neck  LUNGS: on inspection no signs of respiratory distress, breathing rate appears normal, no obvious gross SOB, gasping or wheezing  CV: no obvious cyanosis  MS: moves all visible extremities without noticeable abnormality  PSYCH/NEURO: pleasant and cooperative, no obvious depression or anxiety, speech and thought processing grossly intact  ASSESSMENT AND PLAN:  Discussed the following assessment and plan:  Chronic pain of right knee - Plan: DG Knee Complete 4 Views Right  Chronic pain of left knee - Plan: DG Knee Complete 4 Views Left  Right leg swelling - Plan: US Venous Img Lower Unilateral Right  Hyperlipidemia, unspecified hyperlipidemia type - Plan: Ambulatory referral to Cardiology for Repatha, Lipid panel  Gastroesophageal reflux disease, esophagitis presence not specified - Plan: pantoprazole (PROTONIX) 20 MG tablet   Hyperglycemia - Plan: Hemoglobin A1c  Polyp of colon, unspecified part of colon, unspecified type -check with [facility] in future when  colonoscoyp due if due   Fall, initial encounter with right knee pain and leg swelling r/o DVT fall was in 01/2019   HM Flu shot due prevnar and pna 23 utd  Tdap and shingrix disc in future   S/p left mastectomy pending right mammo  Out of age window pap  Colonoscopy had years ago Dr. Tiffany Kocher need to get records and check to see when due  DEXA ordered in future    I discussed the assessment and treatment plan with the patient. The patient was provided an opportunity to ask questions and all were answered. The patient agreed with the plan and demonstrated an understanding of the instructions.   The patient was advised to call back or seek an in-person evaluation if the symptoms worsen or if the condition fails to improve as anticipated.  Time spent 20 minutes  Nino Glow  McLean-Scocuzza, MD

## 2019-03-28 ENCOUNTER — Other Ambulatory Visit: Payer: Self-pay

## 2019-03-28 ENCOUNTER — Telehealth: Payer: Self-pay

## 2019-03-28 NOTE — Telephone Encounter (Signed)
Scheduled a telephone visit with lauren Guse FNP Copied from [place] [id number]. Topic: General - Other >> Mar 28, 2019  3:09 PM Leward Quan A wrote: Reason for CRM: Patient called to say that she is having a sore thorat and asking for Dr Olivia Mackie to send an Rx to her pharmacy states that the OTC spray is not helping. Ph# [phone]

## 2019-03-29 ENCOUNTER — Other Ambulatory Visit: Payer: Self-pay

## 2019-03-29 ENCOUNTER — Ambulatory Visit
Admission: RE | Admit: 2019-03-29 | Discharge: 2019-03-29 | Disposition: A | Discharge status: Home / Self Care | Payer: Medicare Other | Source: Ambulatory Visit | Attending: Radiation Oncology | Admitting: Radiation Oncology

## 2019-03-29 ENCOUNTER — Encounter: Payer: Self-pay | Admitting: Radiation Oncology

## 2019-03-29 VITALS — BP 130/70 | HR 82 | Temp 97.7°F | Resp 18 | Wt 170.5 lb

## 2019-03-29 DIAGNOSIS — Z853 Personal history of malignant neoplasm of breast: Secondary | ICD-10-CM | POA: Diagnosis not present

## 2019-03-29 DIAGNOSIS — Z9012 Acquired absence of left breast and nipple: Secondary | ICD-10-CM | POA: Diagnosis not present

## 2019-03-29 DIAGNOSIS — Z923 Personal history of irradiation: Secondary | ICD-10-CM | POA: Insufficient documentation

## 2019-03-29 DIAGNOSIS — C50412 Malignant neoplasm of upper-outer quadrant of left female breast: Secondary | ICD-10-CM

## 2019-03-29 DIAGNOSIS — I890 Lymphedema, not elsewhere classified: Secondary | ICD-10-CM | POA: Diagnosis not present

## 2019-03-29 DIAGNOSIS — Z171 Estrogen receptor negative status [ER-]: Secondary | ICD-10-CM

## 2019-03-29 NOTE — Progress Notes (Signed)
Radiation Oncology Follow up Note  Name: Maria Patterson   Date:   03/29/2019 MRN:  [id number] DOB: Oct 15, 1940    This 78 y.o. female presents to the clinic today for 1-1/2-year follow-up status post radiation therapy to her left chest and peripheral lymphatics for stage IIb invasive mammary carcinoma.  REFERRING PROVIDER: Arnetha Courser, MD  HPI: Patient is a 78 year old female now seen out a year and a half having completed adjuvant radiation therapy to her left chest wall peripheral emphatic's for stage IIb invasive mammary carcinoma status post left modified radical mastectomy.  She is seen today in routine follow-up is doing well.  She states there is still some scarring and tenderness in her's mastectomy scar.  She has nodularity in that scar which I performed a CT scan on.  Back in March showing no evidence of local recurrence.  She is not on antiestrogen therapy.  She does have lymphedema of her left upper extremity does wear a sleeve for that is undergoing therapy.  COMPLICATIONS OF TREATMENT: none  FOLLOW UP COMPLIANCE: keeps appointments   PHYSICAL EXAM:  BP 130/70 (BP Location: Left Arm)   Pulse 82   Temp 97.7 F (36.5 C) (Tympanic)   Resp 18   Wt 170 lb 8 oz (77.3 kg)   BMI 32.22 kg/m  Patient status post left modified radical mastectomy still has nodular densities in her scar which we appreciated prior examinations.  They appear unchanged.  Right breast is free of dominant mass or nodularity in 2 positions examined.  She does have lymphedema of her left upper extremity although is wearing this sleeve today is hard to evaluate that.  Well-developed well-nourished patient in NAD. HEENT reveals PERLA, EOMI, discs not visualized.  Oral cavity is clear. No oral mucosal lesions are identified. Neck is clear without evidence of cervical or supraclavicular adenopathy. Lungs are clear to A&P. Cardiac examination is essentially unremarkable with regular rate and rhythm without murmur rub  or thrill. Abdomen is benign with no organomegaly or masses noted. Motor sensory and DTR levels are equal and symmetric in the upper and lower extremities. Cranial nerves II through XII are grossly intact. Proprioception is intact. No peripheral adenopathy or edema is identified. No motor or sensory levels are noted. Crude visual fields are within normal range.  RADIOLOGY RESULTS: Prior CT scan reviewed compatible with above-stated findings  PLAN: Present time patient is doing well.  Nodular densities in her mastectomy scar are stable.  I have asked to see her back in 1 year for follow-up.  Patient knows to call with any concerns.  I would like to take this opportunity to thank you for allowing me to participate in the care of your patient.Noreene Filbert, MD

## 2019-03-30 ENCOUNTER — Encounter: Payer: Medicare Other | Admitting: Occupational Therapy

## 2019-03-30 ENCOUNTER — Ambulatory Visit (INDEPENDENT_AMBULATORY_CARE_PROVIDER_SITE_OTHER): Payer: Medicare Other | Admitting: Family Medicine

## 2019-03-30 DIAGNOSIS — J029 Acute pharyngitis, unspecified: Secondary | ICD-10-CM

## 2019-03-30 DIAGNOSIS — J3089 Other allergic rhinitis: Secondary | ICD-10-CM

## 2019-03-30 MED ORDER — LEVOCETIRIZINE DIHYDROCHLORIDE 5 MG PO TABS: 2.5000 mg | ORAL_TABLET | Freq: Every evening | ORAL | 11 refills | Status: DC

## 2019-03-30 NOTE — Progress Notes (Signed)
Patient ID: Maria Patterson, female   DOB: [birth year], 78 y.o.   MRN: [id number]    Virtual Visit via phone Note  This visit type was conducted due to national recommendations for restrictions regarding the COVID-19 pandemic (e.g. social distancing).  This format is felt to be most appropriate for this patient at this time.  All issues noted in this document were discussed and addressed.  No physical exam was performed (except for noted visual exam findings with Video Visits).   I connected with [name] today at  8:40 AM EDT by telephone and verified that I am speaking with the correct person using two identifiers. Location patient: home Location provider: work or home office Persons participating in the virtual visit: patient, provider  I discussed the limitations, risks, security and privacy concerns of performing an evaluation and management service by telephone and the availability of in person appointments. I also discussed with the patient that there may be a patient responsible charge related to this service. The patient expressed understanding and agreed to proceed.   HPI: Patient and I connected via telephone due to complaints of sore throat for the past 2 days.  States the sore throat started after she was outside in her lawn mowing the grass.  She used her Flonase nasal spray yesterday and today, this has helped somewhat but continues to have a little bit of irritation to back of throat.  Denies any visible redness or white spots in back of throat when she looks at herself in the mirror.  Does not take her Xyzal regularly; does not believe she has current prescription of this.  No fever or chills.  No drooling, no difficulty swallowing no sinus pain or congestion, no ear pain, no cough/shortness of breath or wheezing, no body aches no GI or GU symptoms.  Patient stays home; has no concerns of being exposed to anyone who has been sick.   ROS: See pertinent positives and negatives  per HPI.  Past Medical History:  Diagnosis Date  . Anemia   . Breast cancer ([place]) 2018   Left  . Breast mass, left 09/22/2016   RECOMMENDATION: Ultrasound-guided core biopsies of masses at left breast 2 o'clock 2 cm from nipple, left breast 2 o'clock 5 cm from nipple, abnormal left axillary lymph nodes.  Marland Kitchen GERD (gastroesophageal reflux disease)   . Hyperglycemia   . Hyperlipidemia   . Obesity   . Personal history of chemotherapy 2018   Left breast  . Personal history of radiation therapy 2018  . Postmenopausal     Past Surgical History:  Procedure Laterality Date  . ABDOMINAL HYSTERECTOMY    . AXILLARY SENTINEL NODE BIOPSY Left 04/14/2017   Procedure: AXILLARY SENTINEL NODE BIOPSY;  Surgeon: Olean Ree, MD;  Location: [facility] ORS;  Service: General;  Laterality: Left;  . BREAST BIOPSY Left 09/30/2016   US biopsy of 3 areas + chemo  . BREAST EXCISIONAL BIOPSY Left 04/14/2017   Left breast invasive cancer left mastectomy  . ESOPHAGOGASTRODUODENOSCOPY (EGD) WITH PROPOFOL N/A 12/24/2014   Procedure: ESOPHAGOGASTRODUODENOSCOPY (EGD) WITH PROPOFOL;  Surgeon: Manya Silvas, MD;  Location[facility] ENDOSCOPY;  Service: Endoscopy;  Laterality: N/A;  . MASTECTOMY Left 04/14/2017   Left breast invasive cancer  . PORTACATH PLACEMENT Right 10/27/2016   Procedure: INSERTION PORT-A-CATH;  Surgeon: Nestor Lewandowsky, MD;  Location: [facility] ORS;  Service: General;  Laterality: Right;  . TONSILECTOMY, ADENOIDECTOMY, BILATERAL MYRINGOTOMY AND TUBES    . TONSILLECTOMY    . TOTAL MASTECTOMY  Left 04/14/2017   Procedure: TOTAL MASTECTOMY;  Surgeon: Olean Ree, MD;  Location: [facility] ORS;  Service: General;  Laterality: Left;    Family History  Problem Relation Age of Onset  . Stroke Mother   . Stroke Father   . Cancer Neg Hx   . Depression Neg Hx   . Breast cancer Neg Hx     Social History   Tobacco Use  . Smoking status: Never Smoker  . Smokeless tobacco: Never Used  . Tobacco comment: smoking  cessation materials not required  Substance Use Topics  . Alcohol use: No    Alcohol/week: 0.0 standard drinks    Current Outpatient Medications:  .  acetaminophen (TYLENOL) 325 MG tablet, Take 2 tablets (650 mg total) by mouth every 6 (six) hours as needed., Disp: 60 tablet, Rfl: 0 .  aspirin 81 MG tablet, Take 81 mg by mouth daily., Disp: , Rfl:  .  atorvastatin (LIPITOR) 20 MG tablet, One by mouth at bedtime three nights a week (Mon, Wed, Fri), Disp: 39 tablet, Rfl: 1 .  Cholecalciferol (VITAMIN D) 2000 units tablet, Take 2,000 Units by mouth 3 (three) times a week. One by mouth twice a week, Disp: , Rfl:  .  Evolocumab (REPATHA SURECLICK) XX123456 MG/ML SOAJ, Inject 140 mg into the skin every 14 (fourteen) days., Disp: 6 pen, Rfl: 1 .  ezetimibe (ZETIA) 10 MG tablet, Take 1 tablet (10 mg total) by mouth daily., Disp: 30 tablet, Rfl: 11 .  ferrous sulfate 325 (65 FE) MG EC tablet, Take 325 mg by mouth 3 (three) times a week. , Disp: , Rfl:  .  fluticasone (FLONASE) 50 MCG/ACT nasal spray, Place 2 sprays into both nostrils daily as needed for allergies or rhinitis., Disp: 16 g, Rfl: 11 .  levocetirizine (XYZAL) 5 MG tablet, Take 0.5 tablets (2.5 mg total) by mouth every evening., Disp: 15 tablet, Rfl: 11 .  Multiple Vitamin (MULTIVITAMIN WITH MINERALS) TABS tablet, Take 1 tablet by mouth 3 (three) times a week. , Disp: , Rfl:  .  pantoprazole (PROTONIX) 20 MG tablet, Take 1 tablet (20 mg total) by mouth daily. 30 minutes before food, Disp: 90 tablet, Rfl: 3 .  Polyethyl Glycol-Propyl Glycol (SYSTANE OP), Place 1 drop into both eyes daily as needed (dry eyes). , Disp: , Rfl:  .  sodium chloride (OCEAN) 0.65 % SOLN nasal spray, Place 1 spray into both nostrils as needed for congestion., Disp: 60 mL, Rfl: 11  EXAM:  GENERAL: alert, oriented, sounds well and in no acute distress  LUNGS: Speaking in full sentences, no signs of respiratory distress, breathing rate appears normal, no obvious gross  SOB, gasping, coughing or wheezing  PSYCH/NEURO: pleasant and cooperative, no obvious depression or anxiety, speech and thought processing grossly intact  ASSESSMENT AND PLAN:  Discussed the following assessment and plan:  Sore throat/seasonal allergies - suspect patient's sore throat is brought upon by being outside mowing the grass and exacerbation of allergy symptoms.  She will continue to use Flonase daily, I have resent a prescription of Xyzal to be taken daily as well to combat allergy symptoms.  Recommended she keep up good fluid intake, do salt water gargles 1-2 times a day, use cough drops and monitor self for any worsening symptoms.  Advised patient that I feel she has no other signs of infection present and she does not require antibiotic.  She will let us know if she is not improving and if any of her symptoms become  worse or she develops new symptoms.   I discussed the assessment and treatment plan with the patient. The patient was provided an opportunity to ask questions and all were answered. The patient agreed with the plan and demonstrated an understanding of the instructions.   The patient was advised to call back or seek an in-person evaluation if the symptoms worsen or if the condition fails to improve as anticipated.  I provided 13 minutes of non-face-to-face time during this encounter.   Jodelle Green, FNP

## 2019-04-03 ENCOUNTER — Ambulatory Visit: Payer: Medicare Other | Attending: Oncology | Admitting: Occupational Therapy

## 2019-04-03 ENCOUNTER — Encounter: Payer: Self-pay | Admitting: Occupational Therapy

## 2019-04-03 ENCOUNTER — Other Ambulatory Visit: Payer: Self-pay

## 2019-04-03 DIAGNOSIS — I972 Postmastectomy lymphedema syndrome: Secondary | ICD-10-CM

## 2019-04-03 NOTE — Therapy (Signed)
[facility] [address], [place], [address] Phone: [phone]   Fax:  [phone]  Occupational Therapy Treatment  Patient Details  Name: Maria Patterson MRN: [id number] Date of Birth: 04-15-1941 Referring Provider (OT): Faythe Casa   Encounter Date: 04/03/2019  OT End of Session - 04/03/19 1012    Visit Number  15    Number of Visits  18    Date for OT Re-Evaluation  05/15/19    OT Start Time  0940    OT Stop Time  1006    OT Time Calculation (min)  26 min    Activity Tolerance  Patient tolerated treatment well    Behavior During Therapy  [facility] for tasks assessed/performed       Past Medical History:  Diagnosis Date  . Anemia   . Breast cancer ([place]) 2018   Left  . Breast mass, left 09/22/2016   RECOMMENDATION: Ultrasound-guided core biopsies of masses at left breast 2 o'clock 2 cm from nipple, left breast 2 o'clock 5 cm from nipple, abnormal left axillary lymph nodes.  Marland Kitchen GERD (gastroesophageal reflux disease)   . Hyperglycemia   . Hyperlipidemia   . Obesity   . Personal history of chemotherapy 2018   Left breast  . Personal history of radiation therapy 2018  . Postmenopausal     Past Surgical History:  Procedure Laterality Date  . ABDOMINAL HYSTERECTOMY    . AXILLARY SENTINEL NODE BIOPSY Left 04/14/2017   Procedure: AXILLARY SENTINEL NODE BIOPSY;  Surgeon: Olean Ree, MD;  Location: [facility] ORS;  Service: General;  Laterality: Left;  . BREAST BIOPSY Left 09/30/2016   US biopsy of 3 areas + chemo  . BREAST EXCISIONAL BIOPSY Left 04/14/2017   Left breast invasive cancer left mastectomy  . ESOPHAGOGASTRODUODENOSCOPY (EGD) WITH PROPOFOL N/A 12/24/2014   Procedure: ESOPHAGOGASTRODUODENOSCOPY (EGD) WITH PROPOFOL;  Surgeon: Manya Silvas, MD;  Location: [facility] ENDOSCOPY;  Service: Endoscopy;  Laterality: N/A;  . MASTECTOMY Left 04/14/2017   Left breast invasive cancer  . PORTACATH PLACEMENT Right  10/27/2016   Procedure: INSERTION PORT-A-CATH;  Surgeon: Nestor Lewandowsky, MD;  Location: [facility] ORS;  Service: General;  Laterality: Right;  . TONSILECTOMY, ADENOIDECTOMY, BILATERAL MYRINGOTOMY AND TUBES    . TONSILLECTOMY    . TOTAL MASTECTOMY Left 04/14/2017   Procedure: TOTAL MASTECTOMY;  Surgeon: Olean Ree, MD;  Location: [facility] ORS;  Service: General;  Laterality: Left;    There were no vitals filed for this visit.  Subjective Assessment - 04/03/19 1010    Subjective   I had seen you March the last time - and think I need a replacement sleeve - doing the pump , night sleeve and daysleeve - but the fabric feels so bulky at my wrist - and then it wants to swell - otherwise my scan and mammogram was cleared    Pertinent History  s/p total mastectomy 04/14/2017.  Completed neoadjuvant Taxotere, carbo, Herceptin and Perjeta on 02/17/2017.  Completed 1 year of Herceptin on 11/03/2017.  Would not benefit from aromatase inhibitor.Pt cellulitis started 06/03/18 in R forearm - did 3 rounds of antibiotics - appear last one improved symptoms - DVT Doppler was negative     Patient Stated Goals  Want my arm swelling and infection better    Currently in Pain?  No/denies          LYMPHEDEMA/ONCOLOGY QUESTIONNAIRE - 04/03/19 0951      Left Upper Extremity Lymphedema   15 cm  Proximal to Olecranon Process  32 cm    10 cm Proximal to Olecranon Process  34 cm    Olecranon Process  28.5 cm    15 cm Proximal to Ulnar Styloid Process  29.5 cm    10 cm Proximal to Ulnar Styloid Process  26.5 cm    Just Proximal to Ulnar Styloid Process  17.8 cm    Across Hand at [employer]  19 cm        Measurements staying steady compare to March 2020  Pt scans and mammogram cleared earlier this year She returns today for check up - her daytime compression sleeve is 8 months old - need new compression sleeve   She arrive with sleeve not all the way up - and causing some more fabric at wrist - narrow band at top -  would recommend Juzo slippy gator device to make easier to donn and pull up - pt' measurements increase at wrist -but all others stayed steady   Pt to also bring in her night sleeve - when new day sleeve comes in - for me to assess and educate her - she got it during [place] and did not assess fit   Cont to use pump 2 x day for 45 -60 min And forearm still hard and fibrotic -but she reports it feels softer after use of pump                 OT Education - 04/03/19 1011    Education Details  Findings and compare measurements to march -and POC to get new daytime compression , donning and wearing it correctly so it do no get bulky at the wrist    Person(s) Educated  Patient    Methods  Explanation;Demonstration;Handout    Comprehension  Verbalized understanding;Returned demonstration          OT Long Term Goals - 04/03/19 1023      OT LONG TERM GOAL #1   Title  Pt to be independent in wearing of new daytime compression sleeve  and donning it correctly with use of slippy gator device    Baseline  Her daysleeve is 40 months old ,and not getting it up all the way - wrist increase compare to March 2 cm - and need sleeve with wider top band    Time  6    Period  Weeks    Status  New    Target Date  05/15/19            Plan - 04/03/19 1016    Clinical Impression Statement  Pt was seen the last time in March  after she was treated for L UE lymphedema- pt had end for 2019  cellulitis in L forearm - 3 rounds of antibiotics - pt was fitted with daytime Jobst elvarex sleeve and glove - compression pump to use 78min -60 min 2 x day -and then she still had issues in her forearm measurements - was fitted with night time compresssion sleeve but was not assess for fitting because of COVID - she retuns today for 8 months check up for daytime compression sleeve to be replace - her measurements stayed very steady compare to March - except wrist increase by 2 cm - pt sleeve she is not getting  up all the way - would recommend to get  replacement sleeve with wider band - and Juzo slippy gator device to make it easier to pull on sleeve - she will follow  up with me again when her new daytime compression sleeve comes in and will review night time sleeve wearing and fit again    OT Occupational Profile and History  Problem Focused Assessment - Including review of records relating to presenting problem    Occupational performance deficits (Please refer to evaluation for details):  ADL's    Body Structure / Function / Physical Skills  ADL;Edema    Rehab Potential  Good    Clinical Decision Making  Limited treatment options, no task modification necessary    Comorbidities Affecting Occupational Performance:  None    Modification or Assistance to Complete Evaluation   No modification of tasks or assist necessary to complete eval    OT Frequency  Biweekly    OT Duration  6 weeks    OT Treatment/Interventions  Self-care/ADL training;Manual lymph drainage;Patient/family education;Compression bandaging;Therapeutic exercise;Manual Therapy    Plan  assess fit of new  replacement sleeve - and night time juiblee - ed on use of Juzo slippy gator device    OT Home Exercise Plan  see pt instruction    Consulted and Agree with Plan of Care  Patient       Patient will benefit from skilled therapeutic intervention in order to improve the following deficits and impairments:   Body Structure / Function / Physical Skills: ADL, Edema       Visit Diagnosis: Postmastectomy lymphedema syndrome - Plan: Ot plan of care cert/re-cert    Problem List Patient Active Problem List   Diagnosis Date Noted  . Colon polyps 03/17/2019  . Pre-diabetes 02/19/2019  . H/O total mastectomy of left breast 06/03/2018  . Obesity (BMI 30.0-34.9) 03/08/2018  . Insomnia 09/26/2017  . Chronic renal disease, stage III ([place]) 08/09/2017  . Hypomagnesemia 05/04/2017  . Aortic atherosclerosis ([place]) 05/04/2017  . Compression  fracture of first lumbar vertebra ([place]) 05/04/2017  . Degenerative disc disease, lumbar 05/04/2017  . Anemia 02/01/2017  . Atherosclerosis of native arteries of extremity with intermittent claudication ([place]) 11/09/2016  . Allergic rhinitis 11/07/2016  . Malignant neoplasm of upper-outer quadrant of left female breast ([place]) 10/08/2016  . Postmenopausal 09/18/2016  . Ache in joint 03/09/2016  . GERD without esophagitis 07/01/2012  . Hyperlipidemia LDL goal <100 07/01/2012    Rosalyn Gess OTR/L,CLT 04/03/2019, 10:28 AM  [facility] [address], [place], [address] Phone: [phone]   Fax:  [phone]  Name: Hannahmae Schmoldt MRN: [id number] Date of Birth: 10-20-1940

## 2019-04-05 ENCOUNTER — Telehealth: Payer: Self-pay | Admitting: *Deleted

## 2019-04-05 NOTE — Telephone Encounter (Signed)
As requested by Rosalyn Gess, OT, Rx for left upper extremity daytime compression sleeve 20-70mm Hg sent to [facility] medical supply.

## 2019-04-06 ENCOUNTER — Encounter: Payer: Self-pay | Admitting: Cardiology

## 2019-04-06 ENCOUNTER — Other Ambulatory Visit: Payer: Self-pay

## 2019-04-06 ENCOUNTER — Ambulatory Visit (INDEPENDENT_AMBULATORY_CARE_PROVIDER_SITE_OTHER): Payer: Medicare Other | Admitting: Cardiology

## 2019-04-06 VITALS — BP 100/80 | HR 83 | Temp 98.0°F | Ht 61.0 in | Wt 170.8 lb

## 2019-04-06 DIAGNOSIS — E785 Hyperlipidemia, unspecified: Secondary | ICD-10-CM

## 2019-04-06 MED ORDER — ATORVASTATIN CALCIUM 40 MG PO TABS: 40.0000 mg | ORAL_TABLET | Freq: Every day | ORAL | 6 refills | Status: DC

## 2019-04-06 NOTE — Progress Notes (Signed)
Cardiology Office Note:    Date:  04/06/2019   ID:  [name], DOB 08/15/1940, MRN [id number]  PCP:  McLean-Scocuzza, Nino Glow, MD  Cardiologist:  Kate Sable, MD  Electrophysiologist:  None   Referring MD: McLean-Scocuzza, Olivia Mackie *   Chief Complaint  Patient presents with   New Patient (Initial Visit)    Hyperlipidemia    History of Present Illness:    Maria Patterson is a 78 y.o. female with a hx of obesity, hyperlipidemia who presents to the office due to high cholesterol levels.  Patient has been told she has high cholesterol, her last lipid panel was drawn in July of this year( 2 months ago).  She denies any cardiac history.  Denies dyspnea on exertion, chest pain, nausea, vomiting, edema, syncope, lightheadedness, weakness.  She was originally put on Lipitor 20 mg which she takes Monday, Wednesday, Friday.  She states taking the current dose has not really done a whole lot for her cholesterol.  She endorses not eating well in regards to fatty foods.  States she will try to do better.  Past Medical History:  Diagnosis Date   Anemia    Breast cancer ([place]) 2018   Left   Breast mass, left 09/22/2016   RECOMMENDATION: Ultrasound-guided core biopsies of masses at left breast 2 o'clock 2 cm from nipple, left breast 2 o'clock 5 cm from nipple, abnormal left axillary lymph nodes.   GERD (gastroesophageal reflux disease)    Hyperglycemia    Hyperlipidemia    Obesity    Personal history of chemotherapy 2018   Left breast   Personal history of radiation therapy 2018   Postmenopausal     Past Surgical History:  Procedure Laterality Date   ABDOMINAL HYSTERECTOMY     AXILLARY SENTINEL NODE BIOPSY Left 04/14/2017   Procedure: AXILLARY SENTINEL NODE BIOPSY;  Surgeon: Olean Ree, MD;  Location: [facility] ORS;  Service: General;  Laterality: Left;   BREAST BIOPSY Left 09/30/2016   US biopsy of 3 areas + chemo   BREAST EXCISIONAL BIOPSY Left 04/14/2017   Left breast  invasive cancer left mastectomy   ESOPHAGOGASTRODUODENOSCOPY (EGD) WITH PROPOFOL N/A 12/24/2014   Procedure: ESOPHAGOGASTRODUODENOSCOPY (EGD) WITH PROPOFOL;  Surgeon: Manya Silvas, MD;  Location: [facility];  Service: Endoscopy;  Laterality: N/A;   MASTECTOMY Left 04/14/2017   Left breast invasive cancer   PORTACATH PLACEMENT Right 10/27/2016   Procedure: INSERTION PORT-A-CATH;  Surgeon: Nestor Lewandowsky, MD;  Location: [facility] ORS;  Service: General;  Laterality: Right;   TONSILECTOMY, ADENOIDECTOMY, BILATERAL MYRINGOTOMY AND TUBES     TONSILLECTOMY     TOTAL MASTECTOMY Left 04/14/2017   Procedure: TOTAL MASTECTOMY;  Surgeon: Olean Ree, MD;  Location: [facility] ORS;  Service: General;  Laterality: Left;    Current Medications: Current Meds  Medication Sig   acetaminophen (TYLENOL) 325 MG tablet Take 2 tablets (650 mg total) by mouth every 6 (six) hours as needed.   aspirin 81 MG tablet Take 81 mg by mouth daily.   Cholecalciferol (VITAMIN D) 2000 units tablet Take 2,000 Units by mouth 3 (three) times a week.    ezetimibe (ZETIA) 10 MG tablet Take 1 tablet (10 mg total) by mouth daily.   ferrous sulfate 325 (65 FE) MG EC tablet Take 325 mg by mouth 3 (three) times a week.    fluticasone (FLONASE) 50 MCG/ACT nasal spray Place 2 sprays into both nostrils daily as needed for allergies or rhinitis.   levocetirizine (XYZAL) 5 MG tablet Take 0.5  tablets (2.5 mg total) by mouth every evening.   Multiple Vitamin (MULTIVITAMIN WITH MINERALS) TABS tablet Take 1 tablet by mouth 3 (three) times a week.    pantoprazole (PROTONIX) 20 MG tablet Take 1 tablet (20 mg total) by mouth daily. 30 minutes before food   Polyethyl Glycol-Propyl Glycol (SYSTANE OP) Place 1 drop into both eyes daily as needed (dry eyes).    sodium chloride (OCEAN) 0.65 % SOLN nasal spray Place 1 spray into both nostrils as needed for congestion.   [DISCONTINUED] atorvastatin (LIPITOR) 20 MG tablet One by mouth at  bedtime three nights a week (Mon, Wed, Fri)      Allergies:   Latex and Penicillins   Social History   Socioeconomic History   Marital status: Widowed    Spouse name: Not on file   Number of children: 1   Years of education: GED   Highest education level: 12th grade  Occupational History   Occupation: Retired  Scientist, product/process development strain: Somewhat hard   Food insecurity    Worry: Sometimes true    Inability: Sometimes true   Transportation needs    Medical: No    Non-medical: No  Tobacco Use   Smoking status: Never Smoker   Smokeless tobacco: Never Used   Tobacco comment: smoking cessation materials not required  Substance and Sexual Activity   Alcohol use: No    Alcohol/week: 0.0 standard drinks   Drug use: No   Sexual activity: Not Currently  Lifestyle   Physical activity    Days per week: 0 days    Minutes per session: 0 min   Stress: Not at all  Relationships   Social connections    Talks on phone: More than three times a week    Gets together: More than three times a week    Attends religious service: More than 4 times per year    Active member of club or organization: No    Attends meetings of clubs or organizations: Never    Relationship status: Widowed  Other Topics Concern   Not on file  Social History Narrative   Lives at home    1 kid [name] (daughter) [phone]     Family History: The patient's family history includes Stroke in her father and mother. There is no history of Cancer, Depression, or Breast cancer.  ROS:   Please see the history of present illness.     All other systems reviewed and are negative.  EKGs/Labs/Other Studies Reviewed:    The following studies were reviewed today:   EKG:  EKG is  ordered today.  The ekg ordered today demonstrates normal sinus rhythm, normal ECG.  Recent Labs: 01/06/2019: Hemoglobin 12.6; Platelets 214 02/17/2019: ALT 12; BUN 20; Creat 1.34; Potassium 4.0;  Sodium 143  Recent Lipid Panel    Component Value Date/Time   CHOL 293 (H) 02/17/2019 1618   CHOL 318 (H) 10/23/2015 0845   TRIG 184 (H) 02/17/2019 1618   HDL 60 02/17/2019 1618   HDL 69 10/23/2015 0845   CHOLHDL 4.9 02/17/2019 1618   VLDL 19 02/01/2017 0839   LDLCALC 197 (H) 02/17/2019 1618    Physical Exam:    VS:  BP 100/80 (BP Location: Right Arm, Patient Position: Sitting, Cuff Size: Normal)    Pulse 83    Temp 98 F (36.7 C)    Ht 5\' 1"  (1.549 m)    Wt 170 lb 12 oz (77.5 kg)  SpO2 97%    BMI 32.26 kg/m     Wt Readings from Last 3 Encounters:  04/06/19 170 lb 12 oz (77.5 kg)  03/29/19 170 lb 8 oz (77.3 kg)  02/17/19 173 lb 3.2 oz (78.6 kg)     GEN:  Well nourished, well developed in no acute distress HEENT: Normal NECK: No JVD; No carotid bruits LYMPHATICS: No lymphadenopathy CARDIAC: RRR, no murmurs, rubs, gallops RESPIRATORY:  Clear to auscultation without rales, wheezing or rhonchi  ABDOMEN: Soft, non-tender, non-distended MUSCULOSKELETAL:  No edema in lower extremities; No deformity, compression stockings noted in left upper extremity. SKIN: Warm and dry NEUROLOGIC:  Alert and oriented x 3 PSYCHIATRIC:  Normal affect   ASSESSMENT:    Based on lipid panel obtained 2 months ago, her ASCVD risk score is 15.5%, given her age group over 21 years, ACC AHA guidelines recommend moderate intensity statin.  1. Hyperlipidemia LDL goal <100    PLAN:    In order of problems listed above:  1. We will increase Lipitor to 40 mg daily, repeat lipid panel in 6 months.  Patient advised to eat heart healthy low-cholesterol diet.  Follow-up after lipid panel obtained.   Medication Adjustments/Labs and Tests Ordered: Current medicines are reviewed at length with the patient today.  Concerns regarding medicines are outlined above.  Orders Placed This Encounter  Procedures   EKG 12-Lead   Meds ordered this encounter  Medications   atorvastatin (LIPITOR) 40 MG tablet     Sig: Take 1 tablet (40 mg total) by mouth daily.    Dispense:  30 tablet    Refill:  6    Patient Instructions  Medication Instructions:  - Your physician has recommended you make the following change in your medication:   1) Increase lipitor (atrovastatin) to 40 mg- take 1 tablet by mouth once daily   If you need a refill on your cardiac medications before your next appointment, please call your pharmacy.   Lab work: - Your physician recommends that you return for a FASTING lipid profile: in 6 months- we will remind you closer to time of this.   If you have labs (blood work) drawn today and your tests are completely normal, you will receive your results only by:  [place] (if you have MyChart) OR  A paper copy in the mail If you have any lab test that is abnormal or we need to change your treatment, we will call you to review the results.  Testing/Procedures: - none ordered  Follow-Up: At [facility], you and your health needs are our priority.  As part of our continuing mission to provide you with exceptional heart care, we have created designated Provider Care Teams.  These Care Teams include your primary Cardiologist (physician) and Advanced Practice Providers (APPs -  Physician Assistants and Nurse Practitioners) who all work together to provide you with the care you need, when you need it.  You will need a follow up appointment in 6 months (March 2021) Please call our office 2 months in advance to schedule this appointment. (Call in early January 2021 to schedule).  You may see Kate Sable, MD or one of the following Advanced Practice Providers on your designated Care Team:   Murray Hodgkins, NP Christell Faith, PA-C  Marrianne Mood, PA-C  Any Other Special Instructions Will Be Listed Below (If Applicable). - N/A      Signed, Kate Sable, MD  04/06/2019 8:29 AM    [facility]  Heart[facility]

## 2019-04-06 NOTE — Patient Instructions (Addendum)
Medication Instructions:  - Your physician has recommended you make the following change in your medication:   1) Increase lipitor (atrovastatin) to 40 mg- take 1 tablet by mouth once daily   If you need a refill on your cardiac medications before your next appointment, please call your pharmacy.   Lab work: - Your physician recommends that you return for a FASTING lipid profile: in 6 months- we will remind you closer to time of this.   If you have labs (blood work) drawn today and your tests are completely normal, you will receive your results only by: [place] [facility] MyChart Message (if you have MyChart) OR . A paper copy in the mail If you have any lab test that is abnormal or we need to change your treatment, we will call you to review the results.  Testing/Procedures: - none ordered  Follow-Up: At [facility], you and your health needs are our priority.  As part of our continuing mission to provide you with exceptional heart care, we have created designated Provider Care Teams.  These Care Teams include your primary Cardiologist (physician) and Advanced Practice Providers (APPs -  Physician Assistants and Nurse Practitioners) who all work together to provide you with the care you need, when you need it.  You will need a follow up appointment in 6 months (March 2021) Please call our office 2 months in advance to schedule this appointment. (Call in early January 2021 to schedule).  You may see Kate Sable, MD or one of the following Advanced Practice Providers on your designated Care Team:   Murray Hodgkins, NP Christell Faith, PA-C . Marrianne Mood, PA-C  Any Other Special Instructions Will Be Listed Below (If Applicable). - N/A

## 2019-04-11 ENCOUNTER — Ambulatory Visit (INDEPENDENT_AMBULATORY_CARE_PROVIDER_SITE_OTHER): Payer: Medicare Other | Admitting: Surgery

## 2019-04-11 ENCOUNTER — Other Ambulatory Visit: Payer: Self-pay

## 2019-04-11 ENCOUNTER — Encounter: Payer: Self-pay | Admitting: Surgery

## 2019-04-11 VITALS — BP 137/73 | HR 73 | Temp 97.7°F | Resp 14 | Ht 61.0 in | Wt 173.2 lb

## 2019-04-11 DIAGNOSIS — C50412 Malignant neoplasm of upper-outer quadrant of left female breast: Secondary | ICD-10-CM

## 2019-04-11 DIAGNOSIS — Z171 Estrogen receptor negative status [ER-]: Secondary | ICD-10-CM

## 2019-04-11 NOTE — Patient Instructions (Signed)
We will contact you in February 2021 to schedule your mammogram and breast exam. If you have not heard from our office the end of February 2021 please call so we can check on this for you.  If you continue to have pain in your shoulder let us know.

## 2019-04-11 NOTE — Progress Notes (Signed)
04/11/2019  History of Present Illness: Maria Patterson is a 78 y.o. female with a history of left breast cancer, s/p neoadjuvant chemotherapy with pathologic response to Stage 0, s/p left mastectomy and SLNBx on 04/14/17.  She's s/p radiation therapy as well.  She developed lymphedema of the left upper extremity and has been followed up by PT and has a left upper extremity sleeve.  She reports that the swelling has improved but she is getting a new sleeve soon.  She also reports that she's noted intermittent swelling in the left upper chest, as well as shoulder pain that she thinks is arthritis.  Past Medical History: Past Medical History:  Diagnosis Date  . Anemia   . Breast cancer ([place]) 2018   Left  . Breast mass, left 09/22/2016   RECOMMENDATION: Ultrasound-guided core biopsies of masses at left breast 2 o'clock 2 cm from nipple, left breast 2 o'clock 5 cm from nipple, abnormal left axillary lymph nodes.  Marland Kitchen GERD (gastroesophageal reflux disease)   . Hyperglycemia   . Hyperlipidemia   . Obesity   . Personal history of chemotherapy 2018   Left breast  . Personal history of radiation therapy 2018  . Postmenopausal      Past Surgical History: Past Surgical History:  Procedure Laterality Date  . ABDOMINAL HYSTERECTOMY    . AXILLARY SENTINEL NODE BIOPSY Left 04/14/2017   Procedure: AXILLARY SENTINEL NODE BIOPSY;  Surgeon: Olean Ree, MD;  Location: [facility] ORS;  Service: General;  Laterality: Left;  . BREAST BIOPSY Left 09/30/2016   US biopsy of 3 areas + chemo  . BREAST EXCISIONAL BIOPSY Left 04/14/2017   Left breast invasive cancer left mastectomy  . ESOPHAGOGASTRODUODENOSCOPY (EGD) WITH PROPOFOL N/A 12/24/2014   Procedure: ESOPHAGOGASTRODUODENOSCOPY (EGD) WITH PROPOFOL;  Surgeon: Manya Silvas, MD;  Location: [facility] ENDOSCOPY;  Service: Endoscopy;  Laterality: N/A;  . MASTECTOMY Left 04/14/2017   Left breast invasive cancer  . PORTACATH PLACEMENT Right 10/27/2016   Procedure:  INSERTION PORT-A-CATH;  Surgeon: Nestor Lewandowsky, MD;  Location: [facility] ORS;  Service: General;  Laterality: Right;  . TONSILECTOMY, ADENOIDECTOMY, BILATERAL MYRINGOTOMY AND TUBES    . TONSILLECTOMY    . TOTAL MASTECTOMY Left 04/14/2017   Procedure: TOTAL MASTECTOMY;  Surgeon: Olean Ree, MD;  Location: [facility] ORS;  Service: General;  Laterality: Left;    Home Medications: Prior to Admission medications   Medication Sig Start Date End Date Taking? Authorizing Provider  acetaminophen (TYLENOL) 325 MG tablet Take 2 tablets (650 mg total) by mouth every 6 (six) hours as needed. 01/15/18  Yes Carrie Mew, MD  aspirin 81 MG tablet Take 81 mg by mouth daily.   Yes [provider]  atorvastatin (LIPITOR) 40 MG tablet Take 1 tablet (40 mg total) by mouth daily. 04/06/19 07/05/19 Yes Agbor-Etang, Aaron Edelman, MD  Cholecalciferol (VITAMIN D) 2000 units tablet Take 2,000 Units by mouth 3 (three) times a week.  09/01/16  Yes Lada, Satira Anis, MD  ezetimibe (ZETIA) 10 MG tablet Take 1 tablet (10 mg total) by mouth daily. 06/21/18  Yes Lada, Satira Anis, MD  ferrous sulfate 325 (65 FE) MG EC tablet Take 325 mg by mouth 3 (three) times a week.  09/01/16  Yes Lada, Satira Anis, MD  fluticasone (FLONASE) 50 MCG/ACT nasal spray Place 2 sprays into both nostrils daily as needed for allergies or rhinitis. 10/21/18  Yes Lada, Satira Anis, MD  levocetirizine (XYZAL) 5 MG tablet Take 0.5 tablets (2.5 mg total) by mouth every evening. 03/30/19  Yes Guse, Jacquelynn Cree, FNP  Multiple Vitamin (MULTIVITAMIN WITH MINERALS) TABS tablet Take 1 tablet by mouth 3 (three) times a week.  09/01/16  Yes Lada, Satira Anis, MD  pantoprazole (PROTONIX) 20 MG tablet Take 1 tablet (20 mg total) by mouth daily. 30 minutes before food 03/17/19  Yes McLean-Scocuzza, Nino Glow, MD  Polyethyl Glycol-Propyl Glycol (SYSTANE OP) Place 1 drop into both eyes daily as needed (dry eyes).    Yes [provider]  sodium chloride (OCEAN) 0.65 % SOLN nasal spray  Place 1 spray into both nostrils as needed for congestion. 03/08/18  Yes Lada, Satira Anis, MD    Allergies: Allergies  Allergen Reactions  . Latex Itching  . Penicillins Itching and Rash    Has patient had a PCN reaction causing immediate rash, facial/tongue/throat swelling, SOB or lightheadedness with hypotension: Yes Has patient had a PCN reaction causing severe rash involving mucus membranes or skin necrosis: No Has patient had a PCN reaction that required hospitalization No Has patient had a PCN reaction occurring within the last 10 years: No If all of the above answers are "NO", then may proceed with Cephalosporin use.     Review of Systems: Review of Systems  Constitutional: Negative for chills and fever.  Respiratory: Negative for shortness of breath.   Cardiovascular: Negative for chest pain.  Gastrointestinal: Negative for nausea and vomiting.  Musculoskeletal: Positive for joint pain (left shoulder).    Physical Exam BP 137/73   Pulse 73   Temp 97.7 F (36.5 C) (Temporal)   Resp 14   Ht 5\' 1"  (1.549 m)   Wt 173 lb 3.2 oz (78.6 kg)   SpO2 99%   BMI 32.73 kg/m  CONSTITUTIONAL: No acute distress HEENT:  Normocephalic, atraumatic, extraocular motion intact. RESPIRATORY:  Lungs are clear, and breath sounds are equal bilaterally. Normal respiratory effort without pathologic use of accessory muscles. CARDIOVASCULAR: Heart is regular without murmurs, gallops, or rubs. BREAST:  Left breast s/p mastectomy with scar well healed.  There is an area of scarring in the mid portion of the wound that is stable and a small nodule that is stable.  No other palpable masses.  She does have some soft tissue swelling in the upper outer quadrant going towards the shoulder.  There is no left axillary or supraclavicular lymphadenopathy.  On the right, there are no palpable masses, skin changes, or nipple changes.  No left axillary or supraclavicular lymphadenopathy. SKIN:  Right upper chest  port-a-cath excision site well healed. MUSCULOSKELETAL:  Some left shoulder pain with range of motion activity.  There is left upper extremity lymphedema and she's wearing her sleeve which extends from fingers to her axilla.  Labs/Imaging: Last mammogram of right breast previously reviewed on her last visit.  Assessment and Plan: This is a 78 y.o. female with history of left breast cancer, s/p neoadjuvant chemotherapy, s/p left mastectomy and SLNBx on 04/14/17.  Discussed with the patient that on exam, I do not feel any new masses and that the nodule and scar on the mastectomy site are stable.  Discussed with her that her shoulder pain could be arthritis and the swelling could be related to her lymphedema.  She will follow up with PT for exercises and the swelling.  Discussed with her however, that if her pain worsens or become more constant, or spreads, then she should let us know as I would likely then order xray or CT scan to evaluate further given her cancer history.  Patient  will follow up in 6 months with her new right screening mammogram, at which time we'll change to yearly visits.  Face-to-face time spent with the patient and care providers was 15 minutes, with more than 50% of the time spent counseling, educating, and coordinating care of the patient.     Melvyn Neth, [facility]

## 2019-04-20 ENCOUNTER — Encounter: Payer: Self-pay | Admitting: Internal Medicine

## 2019-04-25 ENCOUNTER — Other Ambulatory Visit: Payer: Self-pay

## 2019-04-26 ENCOUNTER — Inpatient Hospital Stay: Payer: Medicare Other | Attending: Oncology | Admitting: Occupational Therapy

## 2019-04-26 ENCOUNTER — Other Ambulatory Visit: Payer: Self-pay

## 2019-04-26 DIAGNOSIS — Z9012 Acquired absence of left breast and nipple: Secondary | ICD-10-CM | POA: Insufficient documentation

## 2019-04-26 DIAGNOSIS — L03114 Cellulitis of left upper limb: Secondary | ICD-10-CM | POA: Insufficient documentation

## 2019-04-26 DIAGNOSIS — I972 Postmastectomy lymphedema syndrome: Secondary | ICD-10-CM | POA: Insufficient documentation

## 2019-04-26 DIAGNOSIS — I890 Lymphedema, not elsewhere classified: Secondary | ICD-10-CM

## 2019-04-26 NOTE — Therapy (Signed)
[facility] [address] [place], [place], [place], [address] Phone: [phone]   Fax:  [phone]  Occupational Therapy Treatment  Patient Details  Name: Maria Patterson MRN: [id number] Date of Birth: January 01, 1941 Referring Provider (OT): Faythe Casa   Encounter Date: 04/26/2019  OT End of Session - 04/26/19 1024    Visit Number  2    Number of Visits  2    Date for OT Re-Evaluation  04/26/19    OT Start Time  0830    OT Stop Time  0910    OT Time Calculation (min)  40 min    Activity Tolerance  Patient tolerated treatment well    Behavior During Therapy  [facility] for tasks assessed/performed       Past Medical History:  Diagnosis Date  . Anemia   . Breast cancer ([place]) 2018   Left  . Breast mass, left 09/22/2016   RECOMMENDATION: Ultrasound-guided core biopsies of masses at left breast 2 o'clock 2 cm from nipple, left breast 2 o'clock 5 cm from nipple, abnormal left axillary lymph nodes.  Marland Kitchen GERD (gastroesophageal reflux disease)   . Hyperglycemia   . Hyperlipidemia   . Obesity   . Personal history of chemotherapy 2018   Left breast  . Personal history of radiation therapy 2018  . Postmenopausal     Past Surgical History:  Procedure Laterality Date  . ABDOMINAL HYSTERECTOMY    . AXILLARY SENTINEL NODE BIOPSY Left 04/14/2017   Procedure: AXILLARY SENTINEL NODE BIOPSY;  Surgeon: Olean Ree, MD;  Location: [facility] ORS;  Service: General;  Laterality: Left;  . BREAST BIOPSY Left 09/30/2016   US biopsy of 3 areas + chemo  . BREAST EXCISIONAL BIOPSY Left 04/14/2017   Left breast invasive cancer left mastectomy  . ESOPHAGOGASTRODUODENOSCOPY (EGD) WITH PROPOFOL N/A 12/24/2014   Procedure: ESOPHAGOGASTRODUODENOSCOPY (EGD) WITH PROPOFOL;  Surgeon: Manya Silvas, MD;  Location: [facility] ENDOSCOPY;  Service: Endoscopy;  Laterality: N/A;  . MASTECTOMY Left 04/14/2017   Left breast invasive cancer  . PORTACATH PLACEMENT Right  10/27/2016   Procedure: INSERTION PORT-A-CATH;  Surgeon: Nestor Lewandowsky, MD;  Location: [facility] ORS;  Service: General;  Laterality: Right;  . TONSILECTOMY, ADENOIDECTOMY, BILATERAL MYRINGOTOMY AND TUBES    . TONSILLECTOMY    . TOTAL MASTECTOMY Left 04/14/2017   Procedure: TOTAL MASTECTOMY;  Surgeon: Olean Ree, MD;  Location: [facility] ORS;  Service: General;  Laterality: Left;    There were no vitals filed for this visit.  Subjective Assessment - 04/26/19 1021    Subjective   I got my new sleeve last week and started wearing it- but I know I put in this morning on inside - out - you need to show me how to use the slippy gator thing -and I brought my night sleeve for you to look at    Patient Stated Goals  Want my arm swelling and infection better    Currently in Pain?  No/denies          LYMPHEDEMA/ONCOLOGY QUESTIONNAIRE - 04/26/19 0827      Left Upper Extremity Lymphedema   15 cm Proximal to Olecranon Process  32.5 cm    10 cm Proximal to Olecranon Process  34.4 cm    Olecranon Process  27.5 cm    15 cm Proximal to Ulnar Styloid Process  28.5 cm    10 cm Proximal to Ulnar Styloid Process  24.5 cm    Just Proximal to Ulnar Styloid Process  17.4 cm    Across Hand at [employer]  19.5 cm    At [place] of 2nd Digit  6.6 cm    At [facility] of Thumb  6.2 cm       Pt arrive with her new daytime compression sleeve Jobst Elvarex soft sleeve - and then old glove  It is inside out - she report she knows - she did not want to correct it if I am going to look at it in little bit  New sleeve fits well - wide band  Pt ed on using slippy gator to donn sleeve with more ease and get it up to upper arm   Night sleeve Jubilee -pt brought to review with her donning and wearing - pt received it during COVID and was no ed on doing correctly  Pt's mid upper arm and distal forearm was increase this date by more than 1 cm -  Appear pt fastening elbow and upper arm straps to tight   pt to make sure she has  gradient -with forearm tighter than upper arm  And elbow and top strap looser  Pt has power sleeve to donn over Jubilee  Pt to contact me if needed - otherwise will contact me in 6-9 months for assessment of replacement sleeve again                OT Education - 04/26/19 1022    Education Details  wearing of garments correctly    Methods  Explanation;Demonstration;Handout    Comprehension  Verbalized understanding;Returned demonstration          OT Long Term Goals - 04/26/19 1029      OT LONG TERM GOAL #1   Title  Pt to be independent in wearing of new daytime compression sleeve  and donning it correctly with use of slippy gator device    Baseline  pt ed on use of slippy gator -and new sleeve fist well - ed on wearing night time one correctly    Status  Achieved            Plan - 04/26/19 1026    Clinical Impression Statement  Pt recieved replacement sleeve for L UE lymphedema- Jobst Elvarex soft - fitting well - ed pt this date on use of slippy gator to make easier for her to donn -and reviewed night time sleeve wearing correctly - because her mid upper arm and forearm was increase -pt to do more compression on forearm and mid upper arm than elbow and upper arm - night sleeve still working well -and using pump 2x day am and pm  60 min at a time - pt to cont with same HEP -and can contact me in 6-9 months for replacements    OT Occupational Profile and History  Problem Focused Assessment - Including review of records relating to presenting problem    Occupational performance deficits (Please refer to evaluation for details):  ADL's;IADL's;Leisure;Play    Body Structure / Function / Physical Skills  ADL;Edema;IADL    Rehab Potential  Good    Clinical Decision Making  Limited treatment options, no task modification necessary    Comorbidities Affecting Occupational Performance:  None    Modification or Assistance to Complete Evaluation   No modification of tasks or  assist necessary to complete eval    OT Treatment/Interventions  Self-care/ADL training;Compression bandaging;DME and/or AE instruction    Plan  pt to put on calendar 6-9 months to replace day sleeve  again    Consulted and Agree with Plan of Care  Patient       Patient will benefit from skilled therapeutic intervention in order to improve the following deficits and impairments:   Body Structure / Function / Physical Skills: ADL, Edema, IADL       Visit Diagnosis: Cellulitis of left forearm  Lymphedema of left upper extremity    Problem List Patient Active Problem List   Diagnosis Date Noted  . Colon polyps 03/17/2019  . Pre-diabetes 02/19/2019  . H/O total mastectomy of left breast 06/03/2018  . Obesity (BMI 30.0-34.9) 03/08/2018  . Insomnia 09/26/2017  . Chronic renal disease, stage III 08/09/2017  . Hypomagnesemia 05/04/2017  . Aortic atherosclerosis ([place]) 05/04/2017  . Compression fracture of first lumbar vertebra ([place]) 05/04/2017  . Degenerative disc disease, lumbar 05/04/2017  . Anemia 02/01/2017  . Atherosclerosis of native arteries of extremity with intermittent claudication ([place]) 11/09/2016  . Allergic rhinitis 11/07/2016  . Malignant neoplasm of upper-outer quadrant of left female breast ([place]) 10/08/2016  . Postmenopausal 09/18/2016  . Ache in joint 03/09/2016  . GERD without esophagitis 07/01/2012  . Hyperlipidemia LDL goal <100 07/01/2012    Rosalyn Gess OTR/L, CLT 04/26/2019, 10:31 AM  [facility] [address] [place], [place], [place], [address] Phone: [phone]   Fax:  [phone]  Name: Maria Patterson MRN: [id number] Date of Birth: [birth year]

## 2019-04-26 NOTE — Patient Instructions (Signed)
See note

## 2019-04-28 ENCOUNTER — Ambulatory Visit: Payer: Medicare Other | Admitting: Occupational Therapy

## 2019-05-03 ENCOUNTER — Ambulatory Visit: Payer: Medicare Other

## 2019-05-11 ENCOUNTER — Ambulatory Visit
Admission: RE | Admit: 2019-05-11 | Discharge: 2019-05-11 | Disposition: A | Discharge status: Home / Self Care | Payer: Medicare Other | Source: Ambulatory Visit | Attending: Internal Medicine | Admitting: Internal Medicine

## 2019-05-11 DIAGNOSIS — E2839 Other primary ovarian failure: Secondary | ICD-10-CM | POA: Diagnosis not present

## 2019-05-11 DIAGNOSIS — Z780 Asymptomatic menopausal state: Secondary | ICD-10-CM | POA: Diagnosis not present

## 2019-05-11 DIAGNOSIS — M810 Age-related osteoporosis without current pathological fracture: Secondary | ICD-10-CM | POA: Diagnosis not present

## 2019-05-12 ENCOUNTER — Other Ambulatory Visit: Payer: Self-pay

## 2019-05-12 ENCOUNTER — Ambulatory Visit (INDEPENDENT_AMBULATORY_CARE_PROVIDER_SITE_OTHER): Payer: Medicare Other | Admitting: Internal Medicine

## 2019-05-12 ENCOUNTER — Encounter: Payer: Self-pay | Admitting: Internal Medicine

## 2019-05-12 VITALS — BP 130/64 | HR 79 | Temp 97.5°F | Ht 61.0 in | Wt 170.4 lb

## 2019-05-12 DIAGNOSIS — C50412 Malignant neoplasm of upper-outer quadrant of left female breast: Secondary | ICD-10-CM | POA: Diagnosis not present

## 2019-05-12 DIAGNOSIS — E785 Hyperlipidemia, unspecified: Secondary | ICD-10-CM

## 2019-05-12 DIAGNOSIS — M810 Age-related osteoporosis without current pathological fracture: Secondary | ICD-10-CM | POA: Diagnosis not present

## 2019-05-12 DIAGNOSIS — B009 Herpesviral infection, unspecified: Secondary | ICD-10-CM | POA: Insufficient documentation

## 2019-05-12 DIAGNOSIS — Z171 Estrogen receptor negative status [ER-]: Secondary | ICD-10-CM

## 2019-05-12 DIAGNOSIS — L309 Dermatitis, unspecified: Secondary | ICD-10-CM | POA: Insufficient documentation

## 2019-05-12 DIAGNOSIS — F32A Depression, unspecified: Secondary | ICD-10-CM

## 2019-05-12 DIAGNOSIS — F329 Major depressive disorder, single episode, unspecified: Secondary | ICD-10-CM

## 2019-05-12 DIAGNOSIS — Z1231 Encounter for screening mammogram for malignant neoplasm of breast: Secondary | ICD-10-CM

## 2019-05-12 DIAGNOSIS — F419 Anxiety disorder, unspecified: Secondary | ICD-10-CM

## 2019-05-12 HISTORY — DX: Depression, unspecified: F32.A

## 2019-05-12 HISTORY — DX: Dermatitis, unspecified: L30.9

## 2019-05-12 HISTORY — DX: Herpesviral infection, unspecified: B00.9

## 2019-05-12 MED ORDER — ACYCLOVIR 5 % EX OINT: 1.0000 "application " | TOPICAL_OINTMENT | Freq: Three times a day (TID) | CUTANEOUS | 11 refills | Status: DC | PRN

## 2019-05-12 MED ORDER — CITALOPRAM HYDROBROMIDE 10 MG PO TABS: 10.0000 mg | ORAL_TABLET | Freq: Every day | ORAL | 3 refills | Status: DC

## 2019-05-12 MED ORDER — HYDROCORTISONE 2.5 % EX OINT: TOPICAL_OINTMENT | Freq: Two times a day (BID) | CUTANEOUS | 11 refills | Status: DC

## 2019-05-12 NOTE — Progress Notes (Addendum)
Chief Complaint  Patient presents with  . Follow-up   F/u  1. dexa +osteoporosis and disc tx options pt declines oral disc prolia ok with this pending cost  2. S/p left breast cancer f/u h/o and rad onc upcoming  3. cKD 3 f/u Dr. Candiss Patterson spring 2021  4. C/o anxiety/depression and wants medication for this denies SI but staying in the house playing role nothing tried before  Lives with her daughter  82. Right ear with blister each year in same location was painful but getting better with otc cream  6. HLd started lipitor 40 mg qhs per cards 03/2019 and due to f/u 09/2019    Review of Systems  Constitutional: Negative for weight loss.  HENT: Negative for hearing loss.   Eyes: Negative for blurred vision.  Respiratory: Negative for shortness of breath.   Cardiovascular: Negative for chest pain.  Gastrointestinal: Negative for abdominal pain.  Musculoskeletal: Negative for falls.  Skin: Positive for rash.  Neurological: Negative for headaches.  Psychiatric/Behavioral: Negative for depression.   Past Medical History:  Diagnosis Date  . Anemia   . Breast cancer ([place]) 2018   Left  . Breast mass, left 09/22/2016   RECOMMENDATION: Ultrasound-guided core biopsies of masses at left breast 2 o'clock 2 cm from nipple, left breast 2 o'clock 5 cm from nipple, abnormal left axillary lymph nodes.  Marland Kitchen GERD (gastroesophageal reflux disease)   . Hyperglycemia   . Hyperlipidemia   . Obesity   . Personal history of chemotherapy 2018   Left breast  . Personal history of radiation therapy 2018  . Postmenopausal   . Shingles    right eye    Past Surgical History:  Procedure Laterality Date  . ABDOMINAL HYSTERECTOMY    . AXILLARY SENTINEL NODE BIOPSY Left 04/14/2017   Procedure: AXILLARY SENTINEL NODE BIOPSY;  Surgeon: Maria Ree, MD;  Location: [facility] ORS;  Service: General;  Laterality: Left;  . BREAST BIOPSY Left 09/30/2016   US biopsy of 3 areas + chemo  . BREAST EXCISIONAL BIOPSY Left  04/14/2017   Left breast invasive cancer left mastectomy  . ESOPHAGOGASTRODUODENOSCOPY (EGD) WITH PROPOFOL N/A 12/24/2014   Procedure: ESOPHAGOGASTRODUODENOSCOPY (EGD) WITH PROPOFOL;  Surgeon: Maria Silvas, MD;  Location: [facility] ENDOSCOPY;  Service: Endoscopy;  Laterality: N/A;  . MASTECTOMY Left 04/14/2017   Left breast invasive cancer  . PORTACATH PLACEMENT Right 10/27/2016   Procedure: INSERTION PORT-A-CATH;  Surgeon: Maria Lewandowsky, MD;  Location: [facility] ORS;  Service: General;  Laterality: Right;  . TONSILECTOMY, ADENOIDECTOMY, BILATERAL MYRINGOTOMY AND TUBES    . TONSILLECTOMY    . TOTAL MASTECTOMY Left 04/14/2017   Procedure: TOTAL MASTECTOMY;  Surgeon: Maria Ree, MD;  Location: [facility] ORS;  Service: General;  Laterality: Left;   Family History  Problem Relation Age of Onset  . Stroke Mother   . Stroke Father   . Cancer Neg Hx   . Depression Neg Hx   . Breast cancer Neg Hx    Social History   Socioeconomic History  . Marital status: Widowed    Spouse name: Not on file  . Number of children: 1  . Years of education: GED  . Highest education level: 12th grade  Occupational History  . Occupation: Retired  Scientific laboratory technician  . Financial resource strain: Somewhat hard  . Food insecurity    Worry: Sometimes true    Inability: Sometimes true  . Transportation needs    Medical: No    Non-medical: No  Tobacco Use  .  Smoking status: Never Smoker  . Smokeless tobacco: Never Used  . Tobacco comment: smoking cessation materials not required  Substance and Sexual Activity  . Alcohol use: No    Alcohol/week: 0.0 standard drinks  . Drug use: No  . Sexual activity: Not Currently  Lifestyle  . Physical activity    Days per week: 0 days    Minutes per session: 0 min  . Stress: Not at all  Relationships  . Social connections    Talks on phone: More than three times a week    Gets together: More than three times a week    Attends religious service: More than 4 times per year     Active member of club or organization: No    Attends meetings of clubs or organizations: Never    Relationship status: Widowed  . Intimate partner violence    Fear of current or ex partner: No    Emotionally abused: No    Physically abused: No    Forced sexual activity: No  Other Topics Concern  . Not on file  Social History Narrative   Lives at home    1 kid Maria Patterson (daughter) [phone]   Current Meds  Medication Sig  . acetaminophen (TYLENOL) 325 MG tablet Take 2 tablets (650 mg total) by mouth every 6 (six) hours as needed.  Marland Kitchen aspirin 81 MG tablet Take 81 mg by mouth daily.  Marland Kitchen atorvastatin (LIPITOR) 40 MG tablet Take 1 tablet (40 mg total) by mouth daily.  . Cholecalciferol (VITAMIN D) 2000 units tablet Take 2,000 Units by mouth 3 (three) times a week.   . ezetimibe (ZETIA) 10 MG tablet Take 1 tablet (10 mg total) by mouth daily.  . ferrous sulfate 325 (65 FE) MG EC tablet Take 325 mg by mouth 3 (three) times a week.   . fluticasone (FLONASE) 50 MCG/ACT nasal spray Place 2 sprays into both nostrils daily as needed for allergies or rhinitis.  Marland Kitchen levocetirizine (XYZAL) 5 MG tablet Take 0.5 tablets (2.5 mg total) by mouth every evening.  . Multiple Vitamin (MULTIVITAMIN WITH MINERALS) TABS tablet Take 1 tablet by mouth 3 (three) times a week.   . pantoprazole (PROTONIX) 20 MG tablet Take 1 tablet (20 mg total) by mouth daily. 30 minutes before food  . Polyethyl Glycol-Propyl Glycol (SYSTANE OP) Place 1 drop into both eyes daily as needed (dry eyes).   . sodium chloride (OCEAN) 0.65 % SOLN nasal spray Place 1 spray into both nostrils as needed for congestion.   Allergies  Allergen Reactions  . Latex Itching  . Penicillins Itching and Rash    Has patient had a PCN reaction causing immediate rash, facial/tongue/throat swelling, SOB or lightheadedness with hypotension: Yes Has patient had a PCN reaction causing severe rash involving mucus membranes or skin necrosis: No Has  patient had a PCN reaction that required hospitalization No Has patient had a PCN reaction occurring within the last 10 years: No If all of the above answers are "NO", then may proceed with Cephalosporin use.    Recent Results (from the past 2160 hour(s))  Lipid panel     Status: Abnormal   Collection Time: 02/17/19  4:18 PM  Result Value Ref Range   Cholesterol 293 (H) <200 mg/dL   HDL 60 > OR = 50 mg/dL   Triglycerides 184 (H) <150 mg/dL   LDL Cholesterol (Calc) 197 (H) mg/dL (calc)    Comment: LDL-C levels > or = 190 mg/dL may  indicate familial  hypercholesterolemia (FH). Clinical assessment and  measurement of blood lipid levels should be  considered for all first degree relatives of  patients with an FH diagnosis.  For questions about testing for familial hypercholesterolemia, please call Insurance risk surveyor at [email or web address]. Duncan Dull, et al. J National Lipid Association  Recommendations for Patient-Centered Management of  Dyslipidemia: Part 1 Journal of Clinical Lipidology  2015;9(2), 129-169. Reference range: <100 . Desirable range <100 mg/dL for primary prevention;   <70 mg/dL for patients with CHD or diabetic patients  with > or = 2 CHD risk factors. Marland Kitchen LDL-C is now calculated using the Martin-Hopkins  calculation, which is a validated novel method providing  better accuracy than the Friedewald equation in the  estimation of LDL-C.  Cresenciano Genre et al. Annamaria Helling. [id number]): 2061-2068  (http://education.[facility].com/faq/FAQ164)    Total CHOL/HDL Ratio 4.9 <5.0 (calc)   Non-HDL Cholesterol (Calc) 233 (H) <130 mg/dL (calc)    Comment: Non-HDL level > or = 220 is very high and may indicate  genetic familial hypercholesterolemia (FH). Clinical  assessment and measurement of blood lipid levels  should be considered for all first-degree relatives  of patients with an FH diagnosis. . For patients with diabetes plus 1 major ASCVD risk  factor, treating  to a non-HDL-C goal of <100 mg/dL  (LDL-C of <70 mg/dL) is considered a therapeutic  option.   COMPLETE METABOLIC PANEL WITH GFR     Status: Abnormal   Collection Time: 02/17/19  4:18 PM  Result Value Ref Range   Glucose, Bld 105 (H) 65 - 99 mg/dL    Comment: .            Fasting reference interval . For someone without known diabetes, a glucose value between 100 and 125 mg/dL is consistent with prediabetes and should be confirmed with a follow-up test. .    BUN 20 7 - 25 mg/dL   Creat 1.34 (H) 0.60 - 0.93 mg/dL    Comment: For patients >85 years of age, the reference limit for Creatinine is approximately 13% higher for people identified as African-American. .    GFR, Est Non African American 38 (L) > OR = 60 mL/min/1.33m2   GFR, Est African American 44 (L) > OR = 60 mL/min/1.82m2   BUN/Creatinine Ratio 15 6 - 22 (calc)   Sodium 143 135 - 146 mmol/L   Potassium 4.0 3.5 - 5.3 mmol/L   Chloride 110 98 - 110 mmol/L   CO2 22 20 - 32 mmol/L   Calcium 9.4 8.6 - 10.4 mg/dL   Total Protein 6.2 6.1 - 8.1 g/dL   Albumin 3.7 3.6 - 5.1 g/dL   Globulin 2.5 1.9 - 3.7 g/dL (calc)   AG Ratio 1.5 1.0 - 2.5 (calc)   Total Bilirubin 0.3 0.2 - 1.2 mg/dL   Alkaline phosphatase (APISO) 82 37 - 153 U/L   AST 17 10 - 35 U/L   ALT 12 6 - 29 U/L   Objective  Body mass index is 32.2 kg/m. Wt Readings from Last 3 Encounters:  05/12/19 170 lb 6.4 oz (77.3 kg)  04/11/19 173 lb 3.2 oz (78.6 kg)  04/06/19 170 lb 12 oz (77.5 kg)   Temp Readings from Last 3 Encounters:  05/12/19 (!) 97.5 F (36.4 C) (Oral)  04/11/19 97.7 F (36.5 C) (Temporal)  04/06/19 98 F (36.7 C)   BP Readings from Last 3 Encounters:  05/12/19 130/64  04/11/19 137/73  04/06/19 100/80   Pulse Readings  from Last 3 Encounters:  05/12/19 79  04/11/19 73  04/06/19 83    Physical Exam Vitals signs and nursing note reviewed.  Constitutional:      Appearance: Normal appearance. She is well-developed and  well-groomed. She is obese.     Comments: +mask on    HENT:     Head: Normocephalic and atraumatic.  Eyes:     Conjunctiva/sclera: Conjunctivae normal.     Pupils: Pupils are equal, round, and reactive to light.  Cardiovascular:     Rate and Rhythm: Normal rate and regular rhythm.     Heart sounds: Normal heart sounds. No murmur.  Pulmonary:     Effort: Pulmonary effort is normal.     Breath sounds: Normal breath sounds.  Skin:    General: Skin is warm and dry.  Neurological:     General: No focal deficit present.     Mental Status: She is alert and oriented to person, place, and time. Mental status is at baseline.     Gait: Gait normal.  Psychiatric:        Attention and Perception: Attention and perception normal.        Mood and Affect: Mood and affect normal.        Speech: Speech normal.        Behavior: Behavior normal. Behavior is cooperative.        Thought Content: Thought content normal.        Cognition and Memory: Cognition and memory normal.        Judgment: Judgment normal.     Assessment  Plan  Osteoporosis, unspecified osteoporosis type, unspecified pathological fracture presence Disc prolia she is agreeable   Anxiety and depression - Plan: citalopram (CELEXA) 10 MG tablet f/u 09/2019   Herpes right ear healing- Plan: acyclovir ointment (ZOVIRAX) 5 % Dermatitis right ear healing - Plan: hydrocortisone 2.5 % ointment  Malignant neoplasm of upper-outer quadrant of left breast in female, estrogen receptor negative ([place]) F/u h/o and rad onc upcoming   Hyperlipidemia LDL goal <100  On zetia and lipitor 40 mg qhs and f/u cards 09/2019   HM Flu shot utd  prevnar and pna 23 utd  Tdap and shingrix disc in future disc today she thinks she has had  S/p left mastectomy 09/25/08 right mammo negative 09/26/18 reordered 2021   Out of age window pap  Colonoscopy had years ago Dr. Tiffany Kocher -02/04/11 sessile tubular adenoma/IH; EGD duodenal polyp 12/24/14 neg path, hiatal  hernia DEXA +osteoporosis try to get prolia approved Dr. Candiss Patterson 4 or 12/2019 f/u sch   Provider: Dr. Olivia Mackie McLean-Scocuzza-[facility] Medicine

## 2019-05-12 NOTE — Patient Instructions (Addendum)
You will need a follow up appointment in 6 months (March 2021) with heart doctor [phone]    Please call our office 2 months in advance to schedule this appointment. (Call in early January 2021 to schedule).   You may see Kate Sable, MD or one of the following Advanced Practice Providers on your designated Care Team:    Murray Hodgkins, NP  Christell Faith, PA-C  Marrianne Mood, PA-C Consider Tdap vaccine and shingrix vaccine   Osteoporosis  Osteoporosis is thinning and loss of density in your bones. Osteoporosis makes bones more brittle and fragile and more likely to break (fracture). Over time, osteoporosis can cause your bones to become so weak that they fracture after a minor fall. Bones in the hip, wrist, and spine are most likely to fracture due to osteoporosis. What are the causes? The exact cause of this condition is not known. What increases the risk? You may be at greater risk for osteoporosis if you:  Have a family history of the condition.  Have poor nutrition.  Use steroid medicines, such as prednisone.  Are female.  Are age 80 or older.  Smoke or have a history of smoking.  Are not physically active (are sedentary).  Are white (Caucasian) or of Asian descent.  Have a small body frame.  Take certain medicines, such as antiseizure medicines. What are the signs or symptoms? A fracture might be the first sign of osteoporosis, especially if the fracture results from a fall or injury that usually would not cause a bone to break. Other signs and symptoms include:  Pain in the neck or low back.  Stooped posture.  Loss of height. How is this diagnosed? This condition may be diagnosed based on:  Your medical history.  A physical exam.  A bone mineral density test, also called a DXA or DEXA test (dual-energy X-ray absorptiometry test). This test uses X-rays to measure the amount of minerals in your bones. How is this treated? The goal of  treatment is to strengthen your bones and lower your risk for a fracture. Treatment may involve:  Making lifestyle changes, such as: ? Including foods with more calcium and vitamin D in your diet. ? Doing weight-bearing and muscle-strengthening exercises. ? Stopping tobacco use. ? Limiting alcohol intake.  Taking medicine to slow the process of bone loss or to increase bone density.  Taking daily supplements of calcium and vitamin D.  Taking hormone replacement medicines, such as estrogen for women and testosterone for men.  Monitoring your levels of calcium and vitamin D. Follow these instructions at home:  Activity  Exercise as told by your health care provider. Ask your health care provider what exercises and activities are safe for you. You should do: ? Exercises that make you work against gravity (weight-bearing exercises), such as tai chi, yoga, or walking. ? Exercises to strengthen muscles, such as lifting weights. Lifestyle  Limit alcohol intake to no more than 1 drink a day for nonpregnant women and 2 drinks a day for men. One drink equals 12 oz of beer, 5 oz of wine, or 1 oz of hard liquor.  Do not use any products that contain nicotine or tobacco, such as cigarettes and e-cigarettes. If you need help quitting, ask your health care provider. Preventing falls  Use devices to help you move around (mobility aids) as needed, such as canes, walkers, scooters, or crutches.  Keep rooms well-lit and clutter-free.  Remove tripping hazards from walkways, including cords and  throw rugs.  Install grab bars in bathrooms and safety rails on stairs.  Use rubber mats in the bathroom and other areas that are often wet or slippery.  Wear closed-toe shoes that fit well and support your feet. Wear shoes that have rubber soles or low heels.  Review your medicines with your health care provider. Some medicines can cause dizziness or changes in blood pressure, which can increase your  risk of falling. General instructions  Include calcium and vitamin D in your diet. Calcium is important for bone health, and vitamin D helps your body to absorb calcium. Good sources of calcium and vitamin D include: ? Certain fatty fish, such as salmon and tuna. ? Products that have calcium and vitamin D added to them (fortified products), such as fortified cereals. ? Egg yolks. ? Cheese. ? Liver.  Take over-the-counter and prescription medicines only as told by your health care provider.  Keep all follow-up visits as told by your health care provider. This is important. Contact a health care provider if:  You have never been screened for osteoporosis and you are: ? A woman who is age 80 or older. ? A man who is age 16 or older. Get help right away if:  You fall or injure yourself. Summary  Osteoporosis is thinning and loss of density in your bones. This makes bones more brittle and fragile and more likely to break (fracture),even with minor falls.  The goal of treatment is to strengthen your bones and reduce your risk for a fracture.  Include calcium and vitamin D in your diet. Calcium is important for bone health, and vitamin D helps your body to absorb calcium.  Talk with your health care provider about screening for osteoporosis if you are a woman who is age 80 or older, or a man who is age 80 or older. This information is not intended to replace advice given to you by your health care provider. Make sure you discuss any questions you have with your health care provider. Document Released: 04/15/2005 Document Revised: 06/18/2017 Document Reviewed: 04/30/2017 Elsevier Patient Education  [place]. Denosumab injection What is this medicine? DENOSUMAB (den oh sue mab) slows bone breakdown. Prolia is used to treat osteoporosis in women after menopause and in men, and in people who are taking corticosteroids for 6 months or more. Delton See is used to treat a high calcium level  due to cancer and to prevent bone fractures and other bone problems caused by multiple myeloma or cancer bone metastases. Delton See is also used to treat giant cell tumor of the bone. This medicine may be used for other purposes; ask your health care provider or pharmacist if you have questions. COMMON BRAND NAME(S): Prolia, XGEVA What should I tell my health care provider before I take this medicine? They need to know if you have any of these conditions:  dental disease  having surgery or tooth extraction  infection  kidney disease  low levels of calcium or Vitamin D in the blood  malnutrition  on hemodialysis  skin conditions or sensitivity  thyroid or parathyroid disease  an unusual reaction to denosumab, other medicines, foods, dyes, or preservatives  pregnant or trying to get pregnant  breast-feeding How should I use this medicine? This medicine is for injection under the skin. It is given by a health care professional in a hospital or clinic setting. A special MedGuide will be given to you before each treatment. Be sure to read this information carefully each  time. For Prolia, talk to your pediatrician regarding the use of this medicine in children. Special care may be needed. For Delton See, talk to your pediatrician regarding the use of this medicine in children. While this drug may be prescribed for children as young as 13 years for selected conditions, precautions do apply. Overdosage: If you think you have taken too much of this medicine contact a poison control center or emergency room at once. NOTE: This medicine is only for you. Do not share this medicine with others. What if I miss a dose? It is important not to miss your dose. Call your doctor or health care professional if you are unable to keep an appointment. What may interact with this medicine? Do not take this medicine with any of the following medications:  other medicines containing denosumab This medicine may  also interact with the following medications:  medicines that lower your chance of fighting infection  steroid medicines like prednisone or cortisone This list may not describe all possible interactions. Give your health care provider a list of all the medicines, herbs, non-prescription drugs, or dietary supplements you use. Also tell them if you smoke, drink alcohol, or use illegal drugs. Some items may interact with your medicine. What should I watch for while using this medicine? Visit your doctor or health care professional for regular checks on your progress. Your doctor or health care professional may order blood tests and other tests to see how you are doing. Call your doctor or health care professional for advice if you get a fever, chills or sore throat, or other symptoms of a cold or flu. Do not treat yourself. This drug may decrease your body's ability to fight infection. Try to avoid being around people who are sick. You should make sure you get enough calcium and vitamin D while you are taking this medicine, unless your doctor tells you not to. Discuss the foods you eat and the vitamins you take with your health care professional. See your dentist regularly. Brush and floss your teeth as directed. Before you have any dental work done, tell your dentist you are receiving this medicine. Do not become pregnant while taking this medicine or for 5 months after stopping it. Talk with your doctor or health care professional about your birth control options while taking this medicine. Women should inform their doctor if they wish to become pregnant or think they might be pregnant. There is a potential for serious side effects to an unborn child. Talk to your health care professional or pharmacist for more information. What side effects may I notice from receiving this medicine? Side effects that you should report to your doctor or health care professional as soon as possible:  allergic reactions  like skin rash, itching or hives, swelling of the face, lips, or tongue  bone pain  breathing problems  dizziness  jaw pain, especially after dental work  redness, blistering, peeling of the skin  signs and symptoms of infection like fever or chills; cough; sore throat; pain or trouble passing urine  signs of low calcium like fast heartbeat, muscle cramps or muscle pain; pain, tingling, numbness in the hands or feet; seizures  unusual bleeding or bruising  unusually weak or tired Side effects that usually do not require medical attention (report to your doctor or health care professional if they continue or are bothersome):  constipation  diarrhea  headache  joint pain  loss of appetite  muscle pain  runny nose  tiredness  upset stomach This list may not describe all possible side effects. Call your doctor for medical advice about side effects. You may report side effects to FDA at 1-800-FDA-1088. Where should I keep my medicine? This medicine is only given in a clinic, doctor's office, or other health care setting and will not be stored at home. NOTE: This sheet is a summary. It may not cover all possible information. If you have questions about this medicine, talk to your doctor, pharmacist, or health care provider.  2020 Elsevier/Gold Standard (2017-11-12 16:10:44) Citalopram tablets What is this medicine? CITALOPRAM (sye TAL oh pram) is a medicine for depression. This medicine may be used for other purposes; ask your health care provider or pharmacist if you have questions. COMMON BRAND NAME(S): Celexa What should I tell my health care provider before I take this medicine? They need to know if you have any of these conditions:  bleeding disorders  bipolar disorder or a family history of bipolar disorder  glaucoma  heart disease  history of irregular heartbeat  kidney disease  liver disease  low levels of magnesium or potassium in the blood   receiving electroconvulsive therapy  seizures  suicidal thoughts, plans, or attempt; a previous suicide attempt by you or a family member  take medicines that treat or prevent blood clots  thyroid disease  an unusual or allergic reaction to citalopram, escitalopram, other medicines, foods, dyes, or preservatives  pregnant or trying to become pregnant  breast-feeding How should I use this medicine? Take this medicine by mouth with a glass of water. Follow the directions on the prescription label. You can take it with or without food. Take your medicine at regular intervals. Do not take your medicine more often than directed. Do not stop taking this medicine suddenly except upon the advice of your doctor. Stopping this medicine too quickly may cause serious side effects or your condition may worsen. A special MedGuide will be given to you by the pharmacist with each prescription and refill. Be sure to read this information carefully each time. Talk to your pediatrician regarding the use of this medicine in children. Special care may be needed. Patients over 79 years old may have a stronger reaction and need a smaller dose. Overdosage: If you think you have taken too much of this medicine contact a poison control center or emergency room at once. NOTE: This medicine is only for you. Do not share this medicine with others. What if I miss a dose? If you miss a dose, take it as soon as you can. If it is almost time for your next dose, take only that dose. Do not take double or extra doses. What may interact with this medicine? Do not take this medicine with any of the following medications:  certain medicines for fungal infections like fluconazole, itraconazole, ketoconazole, posaconazole, voriconazole  cisapride  dronedarone  escitalopram  linezolid  MAOIs like Carbex, Eldepryl, Marplan, Nardil, and Parnate  methylene blue (injected into a vein)  pimozide  thioridazine This  medicine may also interact with the following medications:  alcohol  amphetamines  aspirin and aspirin-like medicines  carbamazepine  certain medicines for depression, anxiety, or psychotic disturbances  certain medicines for infections like chloroquine, clarithromycin, erythromycin, furazolidone, isoniazid, pentamidine  certain medicines for migraine headaches like almotriptan, eletriptan, frovatriptan, naratriptan, rizatriptan, sumatriptan, zolmitriptan  certain medicines for sleep  certain medicines that treat or prevent blood clots like dalteparin, enoxaparin, warfarin  cimetidine  diuretics  dofetilide  fentanyl  lithium  methadone  metoprolol  NSAIDs, medicines for pain and inflammation, like ibuprofen or naproxen  omeprazole  other medicines that prolong the QT interval (cause an abnormal heart rhythm)  procarbazine  rasagiline  supplements like St. John's wort, kava kava, valerian  tramadol  tryptophan  ziprasidone This list may not describe all possible interactions. Give your health care provider a list of all the medicines, herbs, non-prescription drugs, or dietary supplements you use. Also tell them if you smoke, drink alcohol, or use illegal drugs. Some items may interact with your medicine. What should I watch for while using this medicine? Tell your doctor if your symptoms do not get better or if they get worse. Visit your doctor or health care professional for regular checks on your progress. Because it may take several weeks to see the full effects of this medicine, it is important to continue your treatment as prescribed by your doctor. Patients and their families should watch out for new or worsening thoughts of suicide or depression. Also watch out for sudden changes in feelings such as feeling anxious, agitated, panicky, irritable, hostile, aggressive, impulsive, severely restless, overly excited and hyperactive, or not being able to sleep.  If this happens, especially at the beginning of treatment or after a change in dose, call your health care professional. Dennis Bast may get drowsy or dizzy. Do not drive, use machinery, or do anything that needs mental alertness until you know how this medicine affects you. Do not stand or sit up quickly, especially if you are an older patient. This reduces the risk of dizzy or fainting spells. Alcohol may interfere with the effect of this medicine. Avoid alcoholic drinks. Your mouth may get dry. Chewing sugarless gum or sucking hard candy, and drinking plenty of water will help. Contact your doctor if the problem does not go away or is severe. What side effects may I notice from receiving this medicine? Side effects that you should report to your doctor or health care professional as soon as possible:  allergic reactions like skin rash, itching or hives, swelling of the face, lips, or tongue  anxious  black, tarry stools  breathing problems  changes in vision  chest pain  confusion  elevated mood, decreased need for sleep, racing thoughts, impulsive behavior  eye pain  fast, irregular heartbeat  feeling faint or lightheaded, falls  feeling agitated, angry, or irritable  hallucination, loss of contact with reality  loss of balance or coordination  loss of memory  painful or prolonged erections  restlessness, pacing, inability to keep still  seizures  stiff muscles  suicidal thoughts or other mood changes  trouble sleeping  unusual bleeding or bruising  unusually weak or tired  vomiting Side effects that usually do not require medical attention (report to your doctor or health care professional if they continue or are bothersome):  change in appetite or weight  change in sex drive or performance  dizziness  headache  increased sweating  indigestion, nausea  tremors This list may not describe all possible side effects. Call your doctor for medical advice  about side effects. You may report side effects to FDA at 1-800-FDA-1088. Where should I keep my medicine? Keep out of reach of children. Store at room temperature between 15 and 30 degrees C (59 and 86 degrees F). Throw away any unused medicine after the expiration date. NOTE: This sheet is a summary. It may not cover all possible information. If you have questions about this medicine, talk to your doctor,  pharmacist, or health care provider.  2020 Elsevier/Gold Standard (2018-06-27 09:05:36)

## 2019-05-15 ENCOUNTER — Telehealth: Payer: Self-pay | Admitting: Internal Medicine

## 2019-05-15 NOTE — Telephone Encounter (Signed)
Do you still want pt to come in to get labs done on 12/8? Being that you wanted her 12/16 appt to be moved to 09/2019. Please advise? Thank you!

## 2019-05-16 NOTE — Telephone Encounter (Signed)
Yes fasting labs  [place]

## 2019-05-22 ENCOUNTER — Encounter: Payer: Self-pay | Admitting: Internal Medicine

## 2019-06-23 ENCOUNTER — Other Ambulatory Visit: Payer: Self-pay

## 2019-06-27 ENCOUNTER — Other Ambulatory Visit (INDEPENDENT_AMBULATORY_CARE_PROVIDER_SITE_OTHER): Payer: Medicare Other

## 2019-06-27 ENCOUNTER — Other Ambulatory Visit: Payer: Self-pay

## 2019-06-27 DIAGNOSIS — E785 Hyperlipidemia, unspecified: Secondary | ICD-10-CM

## 2019-06-27 DIAGNOSIS — Z1389 Encounter for screening for other disorder: Secondary | ICD-10-CM

## 2019-06-27 DIAGNOSIS — Z0000 Encounter for general adult medical examination without abnormal findings: Secondary | ICD-10-CM

## 2019-06-27 DIAGNOSIS — R739 Hyperglycemia, unspecified: Secondary | ICD-10-CM | POA: Diagnosis not present

## 2019-06-27 DIAGNOSIS — Z1329 Encounter for screening for other suspected endocrine disorder: Secondary | ICD-10-CM | POA: Diagnosis not present

## 2019-06-27 LAB — CBC WITH DIFFERENTIAL/PLATELET
Basophils Absolute: 0.1 10*3/uL (ref 0.0–0.1)
Basophils Relative: 0.9 % (ref 0.0–3.0)
Eosinophils Absolute: 0.2 10*3/uL (ref 0.0–0.7)
Eosinophils Relative: 2.7 % (ref 0.0–5.0)
HCT: 37.6 % (ref 36.0–46.0)
Hemoglobin: 12.3 g/dL (ref 12.0–15.0)
Lymphocytes Relative: 38 % (ref 12.0–46.0)
Lymphs Abs: 2.3 10*3/uL (ref 0.7–4.0)
MCHC: 32.7 g/dL (ref 30.0–36.0)
MCV: 93.4 fl (ref 78.0–100.0)
Monocytes Absolute: 0.4 10*3/uL (ref 0.1–1.0)
Monocytes Relative: 6.4 % (ref 3.0–12.0)
Neutro Abs: 3.2 10*3/uL (ref 1.4–7.7)
Neutrophils Relative %: 52 % (ref 43.0–77.0)
Platelets: 215 10*3/uL (ref 150.0–400.0)
RBC: 4.03 Mil/uL (ref 3.87–5.11)
RDW: 14.8 % (ref 11.5–15.5)
WBC: 6.1 10*3/uL (ref 4.0–10.5)

## 2019-06-27 LAB — LIPID PANEL
Cholesterol: 301 mg/dL — ABNORMAL HIGH (ref 0–200)
HDL: 65.7 mg/dL (ref >39.00)
LDL Cholesterol: 203 mg/dL — ABNORMAL HIGH (ref 0–99)
NonHDL: 234.83
Total CHOL/HDL Ratio: 5
Triglycerides: 160 mg/dL — ABNORMAL HIGH (ref 0.0–149.0)
VLDL: 32 mg/dL (ref 0.0–40.0)

## 2019-06-27 LAB — COMPREHENSIVE METABOLIC PANEL
ALT: 13 U/L (ref 0–35)
AST: 17 U/L (ref 0–37)
Albumin: 3.9 g/dL (ref 3.5–5.2)
Alkaline Phosphatase: 89 U/L (ref 39–117)
BUN: 22 mg/dL (ref 6–23)
CO2: 25 mEq/L (ref 19–32)
Calcium: 9.5 mg/dL (ref 8.4–10.5)
Chloride: 110 mEq/L (ref 96–112)
Creatinine, Ser: 1.34 mg/dL — ABNORMAL HIGH (ref 0.40–1.20)
GFR: 46.05 mL/min — ABNORMAL LOW (ref >60.00)
Glucose, Bld: 88 mg/dL (ref 70–99)
Potassium: 4.2 mEq/L (ref 3.5–5.1)
Sodium: 142 mEq/L (ref 135–145)
Total Bilirubin: 0.4 mg/dL (ref 0.2–1.2)
Total Protein: 6.5 g/dL (ref 6.0–8.3)

## 2019-06-27 LAB — TSH: TSH: 2.49 u[IU]/mL (ref 0.35–4.50)

## 2019-06-27 LAB — HEMOGLOBIN A1C: Hgb A1c MFr Bld: 5.5 % (ref 4.6–6.5)

## 2019-06-28 LAB — URINALYSIS, ROUTINE W REFLEX MICROSCOPIC
Bacteria, UA: NONE SEEN /HPF
Bilirubin Urine: NEGATIVE
Glucose, UA: NEGATIVE
Hgb urine dipstick: NEGATIVE
Hyaline Cast: NONE SEEN /LPF
Ketones, ur: NEGATIVE
Nitrite: NEGATIVE
Specific Gravity, Urine: 1.02 (ref 1.001–1.03)
pH: 5.5 (ref 5.0–8.0)

## 2019-06-29 ENCOUNTER — Telehealth: Payer: Self-pay

## 2019-06-29 NOTE — Telephone Encounter (Signed)
-----   Message from Delorise Jackson, MD sent at 06/29/2019  3:11 PM EST ----- She needs to take zetia  Inform pt

## 2019-06-29 NOTE — Telephone Encounter (Signed)
Left message for patient to return call back. [facility] may give information.  

## 2019-07-05 ENCOUNTER — Ambulatory Visit: Payer: Medicare Other | Admitting: Internal Medicine

## 2019-07-17 ENCOUNTER — Other Ambulatory Visit: Payer: Self-pay

## 2019-07-17 NOTE — Patient Outreach (Signed)
[facility]) Care Management  07/17/2019  Maria Patterson 1940/12/23 [id number]   Medication Adherence call to Mrs. Maria Patterson Telephone call to Patient regarding Medication Adherence unable to reach patient Mrs. Maria Patterson is showing past due on Atorvastatin 40 mg spoke with patient last month she explain she is no longer taking this medication. Mrs. Maria Patterson is showing past due under [place].  [facility] Direct Dial [phone]  Fax [phone] [email or web address]

## 2019-07-26 ENCOUNTER — Other Ambulatory Visit: Payer: Self-pay

## 2019-07-26 NOTE — Patient Outreach (Signed)
[facility]) Care Management  07/26/2019  Starlisha Hafen 03-31-1941 [id number]   Medication Adherence call to Mrs. Boyden spoke with patient she is past due on Atorvastatin 40 mg,patient explain she is only taking 3 times a week and sometimes she does not take it.patient said doctor is aware and can not force her to take it. Mrs. Musich is showing past due under [place].   [facility] Management Direct Dial [phone]  Fax [phone] [email or web address]

## 2019-07-31 ENCOUNTER — Other Ambulatory Visit: Payer: Self-pay | Admitting: *Deleted

## 2019-07-31 NOTE — Telephone Encounter (Signed)
-----   Message from Nanci Pina, RN sent at 07/31/2019  3:29 PM EST ----- Regarding: Prolia Check status of prolia

## 2019-07-31 NOTE — Progress Notes (Signed)
Insurance verification for Prolia filed on [employer] Portal. 

## 2019-08-07 ENCOUNTER — Other Ambulatory Visit: Payer: Self-pay

## 2019-08-07 DIAGNOSIS — Z1231 Encounter for screening mammogram for malignant neoplasm of breast: Secondary | ICD-10-CM

## 2019-08-18 NOTE — Progress Notes (Signed)
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  INTERVAL HISTORY: Patient returns to clinic today for routine 23-monthevaluation.  She continues to feel well and remains asymptomatic.  She continues to have mild lymphedema of her left arm, but this is chronic and unchanged. She has no neurologic complaints. She denies any recent fevers or illnesses. She has a good appetite and denies weight loss.  She denies any chest pain, shortness of breath, cough, or hemoptysis.  She denies any nausea, vomiting, constipation, or diarrhea. She has no urinary complaints.  Patient offers no further specific complaints today.  REVIEW OF SYSTEMS:   Review of Systems  Constitutional: Negative.  Negative for fever, malaise/fatigue and weight loss.  Respiratory: Negative.  Negative for cough and shortness of breath.   Cardiovascular: Negative.  Negative for chest pain and leg swelling.  Gastrointestinal: Negative.  Negative for abdominal pain and constipation.  Genitourinary: Negative.  Negative for dysuria.  Musculoskeletal: Negative.  Negative for back pain, joint pain and myalgias.  Skin: Negative.  Negative for rash.  Neurological: Negative.  Negative for sensory change, focal weakness, weakness and headaches.  Psychiatric/Behavioral: Negative for  depression. The patient is not nervous/anxious.     As per HPI. Otherwise, a complete review of systems is negative.  PAST MEDICAL HISTORY: Past Medical History:  Diagnosis Date  . Anemia   . Breast cancer ([place] 2018   Left  . Breast mass, left 09/22/2016   RECOMMENDATION: Ultrasound-guided core biopsies of masses at left breast 2 o'clock 2 cm from nipple, left breast 2 o'clock 5 cm from nipple, abnormal left axillary lymph nodes.  .Marland KitchenGERD (gastroesophageal reflux disease)   . Hyperglycemia   . Hyperlipidemia   . Obesity   . Personal history of chemotherapy 2018   Left breast  . Personal history of radiation therapy 2018  . Postmenopausal   . Shingles    right eye     PAST SURGICAL HISTORY: Past Surgical History:  Procedure Laterality Date  . ABDOMINAL HYSTERECTOMY    . AXILLARY SENTINEL NODE BIOPSY Left 04/14/2017   Procedure: AXILLARY SENTINEL NODE BIOPSY;  Surgeon: POlean Ree MD;  Location: [facility] ORS;  Service: General;  Laterality: Left;  . BREAST BIOPSY Left 09/30/2016   UKoreabiopsy of 3 areas + chemo  . BREAST EXCISIONAL BIOPSY Left 04/14/2017   Left breast invasive cancer left mastectomy  . ESOPHAGOGASTRODUODENOSCOPY (EGD) WITH PROPOFOL N/A 12/24/2014   Procedure: ESOPHAGOGASTRODUODENOSCOPY (EGD) WITH PROPOFOL;  Surgeon: RManya Silvas MD;  [facility];  Service: Endoscopy;  Laterality: N/A;  . MASTECTOMY Left 04/14/2017   Left breast invasive cancer  . PORTACATH PLACEMENT Right 10/27/2016   Procedure: INSERTION PORT-A-CATH;  Surgeon: TNestor Lewandowsky MD;  Location: [facility] ORS;  Service: General;  Laterality: Right;  . TONSILECTOMY, ADENOIDECTOMY, BILATERAL MYRINGOTOMY AND TUBES    . TONSILLECTOMY    . TOTAL MASTECTOMY Left 04/14/2017   Procedure: TOTAL MASTECTOMY;  Surgeon: PHampton Abbot  Jacqulyn Bath, MD;  Location: [facility] ORS;  Service: General;  Laterality: Left;    FAMILY HISTORY: Family History  Problem Relation Age of Onset  . Stroke Mother   . Stroke Father   .  Cancer Neg Hx   . Depression Neg Hx   . Breast cancer Neg Hx     ADVANCED DIRECTIVES (Y/N):  N  HEALTH MAINTENANCE: Social History   Tobacco Use  . Smoking status: Never Smoker  . Smokeless tobacco: Never Used  . Tobacco comment: smoking cessation materials not required  Substance Use Topics  . Alcohol use: No    Alcohol/week: 0.0 standard drinks  . Drug use: No     Colonoscopy:  PAP:  Bone density:  Lipid panel:  Allergies  Allergen Reactions  . Latex Itching  . Penicillins Itching and Rash    Has patient had a PCN reaction causing immediate rash, facial/tongue/throat swelling, SOB or lightheadedness with hypotension: Yes Has patient had a PCN reaction causing severe rash involving mucus membranes or skin necrosis: No Has patient had a PCN reaction that required hospitalization No Has patient had a PCN reaction occurring within the last 10 years: No If all of the above answers are "NO", then may proceed with Cephalosporin use.     Current Outpatient Medications  Medication Sig Dispense Refill  . acetaminophen (TYLENOL) 325 MG tablet Take 2 tablets (650 mg total) by mouth every 6 (six) hours as needed. 60 tablet 0  . acyclovir ointment (ZOVIRAX) 5 % Apply 1 application topically 3 (three) times daily as needed. Prn X 3-7 days 30 g 11  . aspirin 81 MG tablet Take 81 mg by mouth daily.    Marland Kitchen atorvastatin (LIPITOR) 40 MG tablet Take 1 tablet (40 mg total) by mouth daily. 30 tablet 6  . Cholecalciferol (VITAMIN D) 2000 units tablet Take 2,000 Units by mouth 3 (three) times a week.     . citalopram (CELEXA) 10 MG tablet Take 1 tablet (10 mg total) by mouth daily. 90 tablet 3  . ezetimibe (ZETIA) 10 MG tablet Take 1 tablet (10 mg total) by mouth daily. 30 tablet 11  . ferrous sulfate 325 (65 FE) MG EC tablet Take 325 mg by mouth 3 (three) times a week.     . fluticasone (FLONASE) 50 MCG/ACT nasal spray Place 2 sprays into both nostrils daily as needed for allergies or  rhinitis. 16 g 11  . hydrocortisone 2.5 % ointment Apply topically 2 (two) times daily. 30 g 11  . levocetirizine (XYZAL) 5 MG tablet Take 0.5 tablets (2.5 mg total) by mouth every evening. 15 tablet 11  . Multiple Vitamin (MULTIVITAMIN WITH MINERALS) TABS tablet Take 1 tablet by mouth 3 (three) times a week.     . pantoprazole (PROTONIX) 20 MG tablet Take 1 tablet (20 mg total) by mouth daily. 30 minutes before food 90 tablet 3  . Polyethyl Glycol-Propyl Glycol (SYSTANE OP) Place 1 drop into both eyes daily as needed (dry eyes).     . sodium chloride (OCEAN) 0.65 % SOLN nasal spray Place 1 spray into both nostrils as needed for congestion. 60 mL 11   No current facility-administered medications for this visit.    OBJECTIVE: Vitals:   08/24/19 1131  BP: 125/68  Pulse: 91  Resp: 18  Temp: (!) 96.7 F (35.9 C)     Body mass index is 33.27 kg/m.    ECOG FS:0 - Asymptomatic  General: Well-developed, well-nourished, no acute distress. Eyes: Pink conjunctiva,  anicteric sclera. HEENT: Normocephalic, moist mucous membranes. Breast: Patient declined exam today. Lungs: No audible wheezing or coughing. Heart: Regular rate and rhythm. Abdomen: Soft, nontender, no obvious distention. Musculoskeletal: No edema, cyanosis, or clubbing.  Lymphedema sleeve in place on left arm. Neuro: Alert, answering all questions appropriately. Cranial nerves grossly intact. Skin: No rashes or petechiae noted. Psych: Normal affect.  LAB RESULTS:  Lab Results  Component Value Date   NA 142 06/27/2019   K 4.2 06/27/2019   CL 110 06/27/2019   CO2 25 06/27/2019   GLUCOSE 88 06/27/2019   BUN 22 06/27/2019   CREATININE 1.34 (H) 06/27/2019   CALCIUM 9.5 06/27/2019   PROT 6.5 06/27/2019   ALBUMIN 3.9 06/27/2019   AST 17 06/27/2019   ALT 13 06/27/2019   ALKPHOS 89 06/27/2019   BILITOT 0.4 06/27/2019   GFRNONAA 38 (L) 02/17/2019   GFRAA 44 (L) 02/17/2019    Lab Results  Component Value Date   WBC 6.1  06/27/2019   NEUTROABS 3.2 06/27/2019   HGB 12.3 06/27/2019   HCT 37.6 06/27/2019   MCV 93.4 06/27/2019   PLT 215.0 06/27/2019     STUDIES: No results found.  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  PLAN:    1. 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 She completed neoadjuvant Taxotere, carboplatinum, Herceptin, and Perjeta on February 17, 2017. She had a total mastectomy on April 14, 2017, therefore did not require adjuvant XRT.  She completed her year long maintenance Herceptin on November 03, 2017.  An aromatase inhibitor would not offer benefit given the ER/PR negativity of her disease.  No further intervention is needed.  Patient's most recent mammogram on September 26, 2018 was reported as BI-RADS 1.  Patient also had a CT scan of the chest on September 19, 2018 did not reveal any evidence of disease.  Repeat mammogram in March 2021.  Return to clinic in 6 months for routine evaluation. 2. Anemia: Resolved.  Patient's most recent hemoglobin was 12.3. 3. Renal insufficiency: Patient's most recent creatinine in December 2020 was 1.34 which is approximately her baseline.  Continue follow-up with nephrology as indicated. 4.  Lymphedema: Significantly improved.  Continue follow-up and treatment per lymphedema clinic. 5.  Arthritis: Patient does not complain of this today.  Continue follow-up and treatment per PCP.   Patient expressed understanding and was in agreement with this plan. She also understands that She can call clinic at any time with any questions, concerns, or complaints.   Cancer Staging Malignant neoplasm of upper-outer quadrant of left female breast [facility]) Staging form: Breast, AJCC 8th Edition - Clinical stage from  10/11/2016: Stage IIB (cT2, cN1, cM0, G3, ER: Negative, PR: Negative, HER2: Positive) - Signed by Lloyd Huger, MD on 10/11/2016 - Pathologic stage from 04/23/2017: No Stage Recommended (ypT0, pN0, cM0, G3, ER: Negative, PR: Negative, HER2: Positive) - Signed by Lloyd Huger, MD on 04/23/2017   Lloyd Huger, MD   08/24/2019 2:27 PM

## 2019-08-24 ENCOUNTER — Inpatient Hospital Stay: Payer: Medicare Other | Attending: Oncology | Admitting: Oncology

## 2019-08-24 ENCOUNTER — Other Ambulatory Visit: Payer: Self-pay

## 2019-08-24 ENCOUNTER — Encounter: Payer: Self-pay | Admitting: Oncology

## 2019-08-24 VITALS — BP 125/68 | HR 91 | Temp 96.7°F | Resp 18 | Wt 176.1 lb

## 2019-08-24 DIAGNOSIS — K219 Gastro-esophageal reflux disease without esophagitis: Secondary | ICD-10-CM | POA: Insufficient documentation

## 2019-08-24 DIAGNOSIS — I890 Lymphedema, not elsewhere classified: Secondary | ICD-10-CM | POA: Insufficient documentation

## 2019-08-24 DIAGNOSIS — C50412 Malignant neoplasm of upper-outer quadrant of left female breast: Secondary | ICD-10-CM | POA: Insufficient documentation

## 2019-08-24 DIAGNOSIS — Z79811 Long term (current) use of aromatase inhibitors: Secondary | ICD-10-CM | POA: Insufficient documentation

## 2019-08-24 DIAGNOSIS — Z79899 Other long term (current) drug therapy: Secondary | ICD-10-CM | POA: Diagnosis not present

## 2019-08-24 DIAGNOSIS — Z171 Estrogen receptor negative status [ER-]: Secondary | ICD-10-CM | POA: Diagnosis not present

## 2019-08-24 DIAGNOSIS — Z923 Personal history of irradiation: Secondary | ICD-10-CM | POA: Insufficient documentation

## 2019-08-24 DIAGNOSIS — M1990 Unspecified osteoarthritis, unspecified site: Secondary | ICD-10-CM | POA: Insufficient documentation

## 2019-08-24 DIAGNOSIS — Z880 Allergy status to penicillin: Secondary | ICD-10-CM | POA: Diagnosis not present

## 2019-08-24 DIAGNOSIS — Z823 Family history of stroke: Secondary | ICD-10-CM | POA: Insufficient documentation

## 2019-08-24 DIAGNOSIS — E785 Hyperlipidemia, unspecified: Secondary | ICD-10-CM | POA: Diagnosis not present

## 2019-08-24 DIAGNOSIS — N289 Disorder of kidney and ureter, unspecified: Secondary | ICD-10-CM | POA: Insufficient documentation

## 2019-08-24 DIAGNOSIS — Z9221 Personal history of antineoplastic chemotherapy: Secondary | ICD-10-CM | POA: Insufficient documentation

## 2019-08-24 NOTE — Progress Notes (Signed)
Pt in for follow up, states having trouble sleeping.  Denies any other difficulties or concerns.

## 2019-09-14 ENCOUNTER — Other Ambulatory Visit: Payer: Self-pay | Admitting: Internal Medicine

## 2019-09-14 ENCOUNTER — Other Ambulatory Visit: Payer: Self-pay | Admitting: *Deleted

## 2019-09-14 DIAGNOSIS — E785 Hyperlipidemia, unspecified: Secondary | ICD-10-CM

## 2019-09-14 MED ORDER — EZETIMIBE 10 MG PO TABS: 10.0000 mg | ORAL_TABLET | Freq: Every day | ORAL | 3 refills | Status: DC

## 2019-09-14 NOTE — Telephone Encounter (Signed)
Patient has been approved for Prolia, no vitamin D level since 6/20 ok to schedule Prolia.

## 2019-09-14 NOTE — Telephone Encounter (Signed)
Patient agreed to Prolia second attempt scheduled visit for 10/03/19, patient also requested refill on Zetia which was prescribed by historical provider. Ok to fill Zetia I have pended for your approval.

## 2019-09-14 NOTE — Telephone Encounter (Signed)
Yes ok to schedule prolia

## 2019-09-27 ENCOUNTER — Ambulatory Visit
Admission: RE | Admit: 2019-09-27 | Discharge: 2019-09-27 | Disposition: A | Discharge status: Home / Self Care | Payer: Medicare Other | Source: Ambulatory Visit | Attending: Surgery | Admitting: Surgery

## 2019-09-27 DIAGNOSIS — Z1231 Encounter for screening mammogram for malignant neoplasm of breast: Secondary | ICD-10-CM | POA: Insufficient documentation

## 2019-10-03 ENCOUNTER — Other Ambulatory Visit: Payer: Self-pay

## 2019-10-03 ENCOUNTER — Ambulatory Visit (INDEPENDENT_AMBULATORY_CARE_PROVIDER_SITE_OTHER): Payer: Medicare Other

## 2019-10-03 DIAGNOSIS — M810 Age-related osteoporosis without current pathological fracture: Secondary | ICD-10-CM | POA: Diagnosis not present

## 2019-10-03 MED ORDER — DENOSUMAB 60 MG/ML ~~LOC~~ SOSY
60.0000 mg | PREFILLED_SYRINGE | Freq: Once | SUBCUTANEOUS | Status: AC
  Administered 2019-10-03: 60 mg via SUBCUTANEOUS

## 2019-10-03 NOTE — Progress Notes (Signed)
Pt presented today for Prolia injection. Right arm, SQ. Pt tolerated injection well.

## 2019-10-04 ENCOUNTER — Other Ambulatory Visit: Payer: Self-pay

## 2019-10-04 ENCOUNTER — Ambulatory Visit: Payer: Medicare Other | Admitting: Surgery

## 2019-10-04 ENCOUNTER — Encounter: Payer: Self-pay | Admitting: Surgery

## 2019-10-04 VITALS — BP 149/76 | HR 99 | Temp 97.7°F | Resp 12 | Wt 176.6 lb

## 2019-10-04 DIAGNOSIS — Z171 Estrogen receptor negative status [ER-]: Secondary | ICD-10-CM | POA: Diagnosis not present

## 2019-10-04 DIAGNOSIS — C50412 Malignant neoplasm of upper-outer quadrant of left female breast: Secondary | ICD-10-CM

## 2019-10-04 NOTE — Patient Instructions (Addendum)
The patient has been asked to return to the office in one year with a Right screening mammogram. Continue self breast exams. Call office for any new breast issues or concerns.    Breast Self-Awareness Breast self-awareness means being familiar with how your breasts look and feel. It involves checking your breasts regularly and reporting any changes to your health care provider. Practicing breast self-awareness is important. Sometimes changes may not be harmful (are benign), but sometimes a change in your breasts can be a sign of a serious medical problem. It is important to learn how to do this procedure correctly so that you can catch problems early, when treatment is more likely to be successful. All women should practice breast self-awareness, including women who have had breast implants. What you need:  A mirror.  A well-lit room. How to do a breast self-exam A breast self-exam is one way to learn what is normal for your breasts and whether your breasts are changing. To do a breast self-exam: Look for changes  1. Remove all the clothing above your waist. 2. Stand in front of a mirror in a room with good lighting. 3. Put your hands on your hips. 4. Push your hands firmly downward. 5. Compare your breasts in the mirror. Look for differences between them (asymmetry), such as: ? Differences in shape. ? Differences in size. ? Puckers, dips, and bumps in one breast and not the other. 6. Look at each breast for changes in the skin, such as: ? Redness. ? Scaly areas. 7. Look for changes in your nipples, such as: ? Discharge. ? Bleeding. ? Dimpling. ? Redness. ? A change in position. Feel for changes Carefully feel your breasts for lumps and changes. It is best to do this while lying on your back on the floor, and again while sitting or standing in the tub or shower with soapy water on your skin. Feel each breast in the following way: 1. Place the arm on the side of the breast you are  examining above your head. 2. Feel your breast with the other hand. 3. Start in the nipple area and make -inch (2 cm) overlapping circles to feel your breast. Use the pads of your three middle fingers to do this. Apply light pressure, then medium pressure, then firm pressure. The light pressure will allow you to feel the tissue closest to the skin. The medium pressure will allow you to feel the tissue that is a little deeper. The firm pressure will allow you to feel the tissue close to the ribs. 4. Continue the overlapping circles, moving downward over the breast until you feel your ribs below your breast. 5. Move one finger-width toward the center of the body. Continue to use the -inch (2 cm) overlapping circles to feel your breast as you move slowly up toward your collarbone. 6. Continue the up-and-down exam using all three pressures until you reach your armpit.  Write down what you find Writing down what you find can help you remember what to discuss with your health care provider. Write down:  What is normal for each breast.  Any changes that you find in each breast, including: ? The kind of changes you find. ? Any pain or tenderness. ? Size and location of any lumps.  Where you are in your menstrual cycle, if you are still menstruating. General tips and recommendations  Examine your breasts every month.  If you are breastfeeding, the best time to examine your breasts is after a  feeding or after using a breast pump.  If you menstruate, the best time to examine your breasts is 5-7 days after your period. Breasts are generally lumpier during menstrual periods, and it may be more difficult to notice changes.  With time and practice, you will become more familiar with the variations in your breasts and more comfortable with the exam. Contact a health care provider if you:  See a change in the shape or size of your breasts or nipples.  See a change in the skin of your breast or  nipples, such as a reddened or scaly area.  Have unusual discharge from your nipples.  Find a lump or thick area that was not there before.  Have pain in your breasts.  Have any concerns related to your breast health. Summary  Breast self-awareness includes looking for physical changes in your breasts, as well as feeling for any changes within your breasts.  Breast self-awareness should be performed in front of a mirror in a well-lit room.  You should examine your breasts every month. If you menstruate, the best time to examine your breasts is 5-7 days after your menstrual period.  Let your health care provider know of any changes you notice in your breasts, including changes in size, changes on the skin, pain or tenderness, or unusual fluid from your nipples. This information is not intended to replace advice given to you by your health care provider. Make sure you discuss any questions you have with your health care provider. Document Revised: 02/22/2018 Document Reviewed: 02/22/2018 Elsevier Patient Education  [employer].

## 2019-10-04 NOTE — Progress Notes (Signed)
10/04/2019  History of Present Illness: Maria Patterson is a 79 y.o. female with a history of left breast cancer, s/p neoadjuvant chemotherapy followed by left mastectomy and SLNBx on 04/14/2017.  She presents for follow up.  She developed left upper extremity lymphedema and she reports that this has been stable throughout.  She uses compression stockings.  From the breast standpoint, she denies any new issues with the left mastectomy scar.  She still notices the two areas along the incision that are stable.  Denies any findings on the right breast.  She had a right mammogram on 09/27/19 which was negative for any suspicious findings.  Past Medical History: Past Medical History:  Diagnosis Date  . Anemia   . Breast cancer ([place]) 2018   Left  . Breast mass, left 09/22/2016   RECOMMENDATION: Ultrasound-guided core biopsies of masses at left breast 2 o'clock 2 cm from nipple, left breast 2 o'clock 5 cm from nipple, abnormal left axillary lymph nodes.  Marland Kitchen GERD (gastroesophageal reflux disease)   . Hyperglycemia   . Hyperlipidemia   . Obesity   . Personal history of chemotherapy 2018   Left breast  . Personal history of radiation therapy 2018  . Postmenopausal   . Shingles    right eye      Past Surgical History: Past Surgical History:  Procedure Laterality Date  . ABDOMINAL HYSTERECTOMY    . AXILLARY SENTINEL NODE BIOPSY Left 04/14/2017   Procedure: AXILLARY SENTINEL NODE BIOPSY;  Surgeon: Olean Ree, MD;  Location: [facility] ORS;  Service: General;  Laterality: Left;  . BREAST BIOPSY Left 09/30/2016   US biopsy of 3 areas + chemo  . BREAST EXCISIONAL BIOPSY Left 04/14/2017   Left breast invasive cancer left mastectomy  . ESOPHAGOGASTRODUODENOSCOPY (EGD) WITH PROPOFOL N/A 12/24/2014   Procedure: ESOPHAGOGASTRODUODENOSCOPY (EGD) WITH PROPOFOL;  Surgeon: Manya Silvas, MD;  Location: [facility] ENDOSCOPY;  Service: Endoscopy;  Laterality: N/A;  . MASTECTOMY Left 04/14/2017   Left breast invasive  cancer  . PORTACATH PLACEMENT Right 10/27/2016   Procedure: INSERTION PORT-A-CATH;  Surgeon: Nestor Lewandowsky, MD;  Location: [facility] ORS;  Service: General;  Laterality: Right;  . TONSILECTOMY, ADENOIDECTOMY, BILATERAL MYRINGOTOMY AND TUBES    . TONSILLECTOMY    . TOTAL MASTECTOMY Left 04/14/2017   Procedure: TOTAL MASTECTOMY;  Surgeon: Olean Ree, MD;  Location: [facility] ORS;  Service: General;  Laterality: Left;    Home Medications: Prior to Admission medications   Medication Sig Start Date End Date Taking? Authorizing Provider  acetaminophen (TYLENOL) 325 MG tablet Take 2 tablets (650 mg total) by mouth every 6 (six) hours as needed. 01/15/18  Yes Carrie Mew, MD  aspirin 81 MG tablet Take 81 mg by mouth daily.   Yes [provider]  Cholecalciferol (VITAMIN D) 2000 units tablet Take 2,000 Units by mouth 3 (three) times a week.  09/01/16  Yes Lada, Satira Anis, MD  ezetimibe (ZETIA) 10 MG tablet Take 1 tablet (10 mg total) by mouth daily. 09/14/19  Yes McLean-Scocuzza, Nino Glow, MD  ferrous sulfate 325 (65 FE) MG EC tablet Take 325 mg by mouth 3 (three) times a week.  09/01/16  Yes Lada, Satira Anis, MD  fluticasone (FLONASE) 50 MCG/ACT nasal spray Place 2 sprays into both nostrils daily as needed for allergies or rhinitis. 10/21/18  Yes Lada, Satira Anis, MD  levocetirizine (XYZAL) 5 MG tablet Take 0.5 tablets (2.5 mg total) by mouth every evening. 03/30/19  Yes Guse, Jacquelynn Cree, FNP  Multiple Vitamin (MULTIVITAMIN  WITH MINERALS) TABS tablet Take 1 tablet by mouth 3 (three) times a week.  09/01/16  Yes Lada, Satira Anis, MD  pantoprazole (PROTONIX) 20 MG tablet Take 1 tablet (20 mg total) by mouth daily. 30 minutes before food 03/17/19  Yes McLean-Scocuzza, Nino Glow, MD  Polyethyl Glycol-Propyl Glycol (SYSTANE OP) Place 1 drop into both eyes daily as needed (dry eyes).    Yes [provider]  sodium chloride (OCEAN) 0.65 % SOLN nasal spray Place 1 spray into both nostrils as needed for  congestion. 03/08/18  Yes Lada, Satira Anis, MD  atorvastatin (LIPITOR) 40 MG tablet Take 1 tablet (40 mg total) by mouth daily. 04/06/19 08/24/19  Kate Sable, MD    Allergies: Allergies  Allergen Reactions  . Latex Itching  . Penicillins Itching and Rash    Has patient had a PCN reaction causing immediate rash, facial/tongue/throat swelling, SOB or lightheadedness with hypotension: Yes Has patient had a PCN reaction causing severe rash involving mucus membranes or skin necrosis: No Has patient had a PCN reaction that required hospitalization No Has patient had a PCN reaction occurring within the last 10 years: No If all of the above answers are "NO", then may proceed with Cephalosporin use.     Review of Systems: Review of Systems  Constitutional: Negative for chills and fever.  Respiratory: Negative for shortness of breath.   Cardiovascular: Negative for chest pain.  Gastrointestinal: Negative for abdominal pain, nausea and vomiting.  Skin: Negative for rash.    Physical Exam BP (!) 149/76   Pulse 99   Temp 97.7 F (36.5 C)   Resp 12   Wt 176 lb 9.6 oz (80.1 kg)   SpO2 97%   BMI 33.37 kg/m  CONSTITUTIONAL: No acute distress HEENT:  Normocephalic, atraumatic, extraocular motion intact. RESPIRATORY:  Lungs are clear, and breath sounds are equal bilaterally. Normal respiratory effort without pathologic use of accessory muscles. CARDIOVASCULAR: Heart is regular without murmurs, gallops, or rubs. BREAST:  Left chest s/p mastectomy with well healed scar.  Two areas of patient's concern are towards the mid portion of the incision which consist of an area of scarring and an area with a small nodule.  These are stable in size.  There is no tenderness.  No other skin changes, palpable masses.  No left axillary or supraclavicular lymphadenopathy.  Right breast without any palpable masses, skin changes, or nipple changes.  Has right upper chest scar from port-a-cath, well healed.  No  right axillary or supraclavicular lymphadenopathy.  NEUROLOGIC:  Motor and sensation is grossly normal.  Cranial nerves are grossly intact. PSYCH:  Alert and oriented to person, place and time. Affect is normal.  Labs/Imaging: Mammogram 09/27/19: FINDINGS: The patient has had a left mastectomy. There are no findings suspicious for malignancy. Images were processed with CAD.  IMPRESSION: No mammographic evidence of malignancy. A result letter of this screening mammogram will be mailed directly to the patient.  RECOMMENDATION: Screening mammogram in one year.  (Code:SM-R-77M)  BI-RADS CATEGORY  1: Negative.  Assessment and Plan: This is a 79 y.o. female with left breast cancer s/p neoadjuvant chemo, followed by left mastectomy and SLNBx  -Discussed with the patient that her exam and mammogram are reassuring.  There are no new findings -Follow up in one year with right breast mammogram and exam.  Face-to-face time spent with the patient and care providers was 15 minutes, with more than 50% of the time spent counseling, educating, and coordinating care of the patient.  Melvyn Neth, [facility]

## 2019-10-06 ENCOUNTER — Telehealth: Payer: Self-pay | Admitting: Cardiology

## 2019-10-06 NOTE — Telephone Encounter (Signed)
-----   Message from Emily Filbert, RN sent at 10/05/2019  4:17 PM EDT ----- Please call the patient to schedule a 6 month f/u with Dr. Garen Lah- she is due to follow up for her hyperlipidemia.  Thanks!

## 2019-10-06 NOTE — Telephone Encounter (Signed)
lmov to schedule documented in recall. Closing encounter.

## 2019-10-12 ENCOUNTER — Other Ambulatory Visit: Payer: Self-pay

## 2019-10-17 ENCOUNTER — Telehealth: Payer: Self-pay | Admitting: Internal Medicine

## 2019-10-17 ENCOUNTER — Other Ambulatory Visit: Payer: Self-pay

## 2019-10-17 ENCOUNTER — Encounter: Payer: Self-pay | Admitting: Internal Medicine

## 2019-10-17 ENCOUNTER — Ambulatory Visit (INDEPENDENT_AMBULATORY_CARE_PROVIDER_SITE_OTHER): Payer: Medicare Other | Admitting: Internal Medicine

## 2019-10-17 VITALS — BP 126/76 | HR 86 | Temp 97.4°F | Ht 61.0 in | Wt 176.0 lb

## 2019-10-17 DIAGNOSIS — M1990 Unspecified osteoarthritis, unspecified site: Secondary | ICD-10-CM

## 2019-10-17 DIAGNOSIS — K219 Gastro-esophageal reflux disease without esophagitis: Secondary | ICD-10-CM

## 2019-10-17 DIAGNOSIS — E785 Hyperlipidemia, unspecified: Secondary | ICD-10-CM

## 2019-10-17 DIAGNOSIS — K0889 Other specified disorders of teeth and supporting structures: Secondary | ICD-10-CM

## 2019-10-17 DIAGNOSIS — N183 Chronic kidney disease, stage 3 unspecified: Secondary | ICD-10-CM

## 2019-10-17 DIAGNOSIS — Z853 Personal history of malignant neoplasm of breast: Secondary | ICD-10-CM

## 2019-10-17 MED ORDER — DICLOFENAC SODIUM 1 % EX GEL: 2.0000 g | Freq: Four times a day (QID) | CUTANEOUS | 11 refills | Status: DC

## 2019-10-17 MED ORDER — ATORVASTATIN CALCIUM 40 MG PO TABS: 40.0000 mg | ORAL_TABLET | Freq: Every day | ORAL | 3 refills | Status: DC

## 2019-10-17 MED ORDER — PANTOPRAZOLE SODIUM 20 MG PO TBEC: 20.0000 mg | DELAYED_RELEASE_TABLET | Freq: Every day | ORAL | 3 refills | Status: DC

## 2019-10-17 MED ORDER — EZETIMIBE 10 MG PO TABS: 10.0000 mg | ORAL_TABLET | Freq: Every day | ORAL | 3 refills | Status: DC

## 2019-10-17 NOTE — Progress Notes (Signed)
Chief Complaint  Patient presents with  . Follow-up   F/u  1. HLD has been out of zetia 10 and lipitor 40 mg qd refilled today  2. gerd wants refill of protonix  3. C/o tongue discomfort and needs to see dentist for dental pain given list today  3. CKD 3 f/u renal 12/2019  4. C/o arthritis pain noted in back rad down right leg, knees, shoulders and neck mild to moderate takes tylenol prn  5. H/o breast cancer with left arm lymphedema seen h/o 08/2019 and surgery 09/2019    Review of Systems  Constitutional: Negative for weight loss.  HENT:       Dental pain   Respiratory: Negative for shortness of breath.   Cardiovascular: Negative for chest pain.  Gastrointestinal: Negative for abdominal pain.  Musculoskeletal: Positive for joint pain.  Skin: Negative for rash.  Psychiatric/Behavioral: Negative for memory loss.   Past Medical History:  Diagnosis Date  . Anemia   . Breast cancer ([place]) 2018   Left  . Breast mass, left 09/22/2016   RECOMMENDATION: Ultrasound-guided core biopsies of masses at left breast 2 o'clock 2 cm from nipple, left breast 2 o'clock 5 cm from nipple, abnormal left axillary lymph nodes.  Marland Kitchen GERD (gastroesophageal reflux disease)   . Hyperglycemia   . Hyperlipidemia   . Obesity   . Personal history of chemotherapy 2018   Left breast  . Personal history of radiation therapy 2018  . Postmenopausal   . Shingles    right eye    Past Surgical History:  Procedure Laterality Date  . ABDOMINAL HYSTERECTOMY    . AXILLARY SENTINEL NODE BIOPSY Left 04/14/2017   Procedure: AXILLARY SENTINEL NODE BIOPSY;  Surgeon: Olean Ree, MD;  Location: [facility] ORS;  Service: General;  Laterality: Left;  . BREAST BIOPSY Left 09/30/2016   US biopsy of 3 areas + chemo  . BREAST EXCISIONAL BIOPSY Left 04/14/2017   Left breast invasive cancer left mastectomy  . ESOPHAGOGASTRODUODENOSCOPY (EGD) WITH PROPOFOL N/A 12/24/2014   Procedure: ESOPHAGOGASTRODUODENOSCOPY (EGD) WITH PROPOFOL;   Surgeon: Manya Silvas, MD;  Location: [facility] END[facility]COPY;  Service: Endoscopy;  Laterality: N/A;  . MASTECTOMY Left 04/14/2017   Left breast invasive cancer  . PORTACATH PLACEMENT Right 10/27/2016   Procedure: INSERTION PORT-A-CATH;  Surgeon: Nestor Lewandowsky, MD;  Location: [facility] ORS;  Service: General;  Laterality: Right;  . TONSILECTOMY, ADENOIDECTOMY, BILATERAL MYRINGOTOMY AND TUBES    . TONSILLECTOMY    . TOTAL MASTECTOMY Left 04/14/2017   Procedure: TOTAL MASTECTOMY;  Surgeon: Olean Ree, MD;  Location: [facility] ORS;  Service: General;  Laterality: Left;   Family History  Problem Relation Age of Onset  . Stroke Mother   . Stroke Father   . Cancer Neg Hx   . Depression Neg Hx   . Breast cancer Neg Hx    Social History   Socioeconomic History  . Marital status: Widowed    Spouse name: Not on file  . Number of children: 1  . Years of education: GED  . Highest education level: 12th grade  Occupational History  . Occupation: Retired  Tobacco Use  . Smoking status: Never Smoker  . Smokeless tobacco: Never Used  . Tobacco comment: smoking cessation materials not required  Substance and Sexual Activity  . Alcohol use: No    Alcohol/week: 0.0 standard drinks  . Drug use: No  . Sexual activity: Not Currently  Other Topics Concern  . Not on file  Social History Narrative  Lives at home    1 kid Maria Patterson (daughter) [phone]   Social Determinants of Health   Financial Resource Strain:   . Difficulty of Paying Living Expenses:   Food Insecurity:   . Worried About Charity fundraiser in the Last Year:   . Arboriculturist in the Last Year:   Transportation Needs:   . Film/video editor (Medical):   Marland Kitchen Lack of Transportation (Non-Medical):   Physical Activity:   . Days of Exercise per Week:   . Minutes of Exercise per Session:   Stress:   . Feeling of Stress :   Social Connections:   . Frequency of Communication with Friends and Family:   . Frequency of Social  Gatherings with Friends and Family:   . Attends Religious Services:   . Active Member of Clubs or Organizations:   . Attends Archivist Meetings:   Marland Kitchen Marital Status:   Intimate Partner Violence:   . Fear of Current or Ex-Partner:   . Emotionally Abused:   Marland Kitchen Physically Abused:   . Sexually Abused:    Current Meds  Medication Sig  . acetaminophen (TYLENOL) 325 MG tablet Take 2 tablets (650 mg total) by mouth every 6 (six) hours as needed.  Marland Kitchen aspirin 81 MG tablet Take 81 mg by mouth daily.  Marland Kitchen atorvastatin (LIPITOR) 40 MG tablet Take 1 tablet (40 mg total) by mouth daily.  . Cholecalciferol (VITAMIN D) 2000 units tablet Take 2,000 Units by mouth 3 (three) times a week.   . ezetimibe (ZETIA) 10 MG tablet Take 1 tablet (10 mg total) by mouth daily.  . ferrous sulfate 325 (65 FE) MG EC tablet Take 325 mg by mouth 3 (three) times a week.   . fluticasone (FLONASE) 50 MCG/ACT nasal spray Place 2 sprays into both nostrils daily as needed for allergies or rhinitis.  . Multiple Vitamin (MULTIVITAMIN WITH MINERALS) TABS tablet Take 1 tablet by mouth 3 (three) times a week.   . pantoprazole (PROTONIX) 20 MG tablet Take 1 tablet (20 mg total) by mouth daily. 30 minutes before food  . Polyethyl Glycol-Propyl Glycol (SYSTANE OP) Place 1 drop into both eyes daily as needed (dry eyes).   . sodium chloride (OCEAN) 0.65 % SOLN nasal spray Place 1 spray into both nostrils as needed for congestion.  . [DISCONTINUED] atorvastatin (LIPITOR) 40 MG tablet Take 1 tablet (40 mg total) by mouth daily.  . [DISCONTINUED] ezetimibe (ZETIA) 10 MG tablet Take 1 tablet (10 mg total) by mouth daily.  . [DISCONTINUED] pantoprazole (PROTONIX) 20 MG tablet Take 1 tablet (20 mg total) by mouth daily. 30 minutes before food   Allergies  Allergen Reactions  . Latex Itching  . Penicillins Itching and Rash    Has patient had a PCN reaction causing immediate rash, facial/tongue/throat swelling, SOB or lightheadedness  with hypotension: Yes Has patient had a PCN reaction causing severe rash involving mucus membranes or skin necrosis: No Has patient had a PCN reaction that required hospitalization No Has patient had a PCN reaction occurring within the last 10 years: No If all of the above answers are "NO", then may proceed with Cephalosporin use.    No results found for this or any previous visit (from the past 2160 hour(s)). Objective  Body mass index is 33.25 kg/m. Wt Readings from Last 3 Encounters:  10/17/19 176 lb (79.8 kg)  10/04/19 176 lb 9.6 oz (80.1 kg)  08/24/19 176 lb 1.6 oz (79.9  kg)   Temp Readings from Last 3 Encounters:  10/17/19 (!) 97.4 F (36.3 C) (Temporal)  10/04/19 97.7 F (36.5 C)  08/24/19 (!) 96.7 F (35.9 C) (Tympanic)   BP Readings from Last 3 Encounters:  10/17/19 126/76  10/04/19 (!) 149/76  08/24/19 125/68   Pulse Readings from Last 3 Encounters:  10/17/19 86  10/04/19 99  08/24/19 91    Physical Exam Vitals and nursing note reviewed.  Constitutional:      Appearance: Normal appearance. She is well-developed and well-groomed. She is obese.  HENT:     Head: Normocephalic and atraumatic.  Eyes:     Conjunctiva/sclera: Conjunctivae normal.     Pupils: Pupils are equal, round, and reactive to light.  Cardiovascular:     Rate and Rhythm: Normal rate and regular rhythm.     Heart sounds: Normal heart sounds. No murmur.  Pulmonary:     Effort: Pulmonary effort is normal.     Breath sounds: Normal breath sounds.  Skin:    General: Skin is warm and dry.  Neurological:     General: No focal deficit present.     Mental Status: She is alert and oriented to person, place, and time. Mental status is at baseline.     Gait: Gait normal.  Psychiatric:        Attention and Perception: Attention and perception normal.        Mood and Affect: Mood and affect normal.        Speech: Speech normal.        Behavior: Behavior normal. Behavior is cooperative.         Thought Content: Thought content normal.        Cognition and Memory: Cognition and memory normal.        Judgment: Judgment normal.     Assessment  Plan  Arthritis - Plan: diclofenac Sodium (VOLTAREN) 1 % GEL  Hyperlipidemia, unspecified hyperlipidemia type - Plan: ezetimibe (ZETIA) 10 MG tablet, atorvastatin (LIPITOR) 40 MG tablet, Comprehensive metabolic panel, Lipid panel  Gastroesophageal reflux disease - Plan: pantoprazole (PROTONIX) 20 MG tablet  History of breast cancer - Plan: Comprehensive metabolic panel, CBC with Differential/Platelet  Pain, dental  Stage 3 chronic kidney disease, unspecified whether stage 3a or 3b CKD Fu renal 12/2019    HM Flu shot utd  prevnar and pna 23 utd  Tdap and shingrix disc in future disc today she thinks she has had covid vx had 2/2   S/p left mastectomy 09/25/08 right mammo negative 09/26/18 reordered 2021   Out of age window pap  Colonoscopy had years ago Dr. Tiffany Kocher -02/04/11 sessile tubular adenoma/IH; EGD duodenal polyp 12/24/14 neg path, hiatal hernia DEXA +osteoporosis try to get prolia approved Dr. Candiss Norse 12/2019 f/u sch   Provider: Dr. Olivia Mackie [facility]

## 2019-10-17 NOTE — Telephone Encounter (Signed)
Eye doctors  1. [facility] in [place]  2. Dr. Gertie Baron [place]  3. [facility] [place]      Dentists  1. [facility] [place]  2. [place]  3. Dr. Johnnette Litter [place]   4. Dr. Lyanne Co [facility] in [place]

## 2019-10-17 NOTE — Patient Instructions (Addendum)
Warm salt gargles  Lemon water  Increase water intake 55 ounces daily   Tylenol  voltaren gel to rub on    Dentists  1. [facility] [place] 2. [place]  3. Dr. Johnnette Litter [place] * 4. Dr. Lyanne Co [facility] in [place] [facility]    Back Exercises The following exercises strengthen the muscles that help to support the trunk and back. They also help to keep the lower back flexible. Doing these exercises can help to prevent back pain or lessen existing pain.  If you have back pain or discomfort, try doing these exercises 2-3 times each day or as told by your health care provider.  As your pain improves, do them once each day, but increase the number of times that you repeat the steps for each exercise (do more repetitions).  To prevent the recurrence of back pain, continue to do these exercises once each day or as told by your health care provider. Do exercises exactly as told by your health care provider and adjust them as directed. It is normal to feel mild stretching, pulling, tightness, or discomfort as you do these exercises, but you should stop right away if you feel sudden pain or your pain gets worse. Exercises Single knee to chest Repeat these steps 3-5 times for each leg: 1. Lie on your back on a firm bed or the floor with your legs extended. 2. Bring one knee to your chest. Your other leg should stay extended and in contact with the floor. 3. Hold your knee in place by grabbing your knee or thigh with both hands and hold. 4. Pull on your knee until you feel a gentle stretch in your lower back or buttocks. 5. Hold the stretch for 10-30 seconds. 6. Slowly release and straighten your leg. Pelvic tilt Repeat these steps 5-10 times: 1. Lie on your back on a firm bed or the floor with your legs extended. 2. Bend your knees so they are pointing toward the ceiling and your feet are flat on the floor. 3. Tighten your  lower abdominal muscles to press your lower back against the floor. This motion will tilt your pelvis so your tailbone points up toward the ceiling instead of pointing to your feet or the floor. 4. With gentle tension and even breathing, hold this position for 5-10 seconds. Cat-cow Repeat these steps until your lower back becomes more flexible: 1. Get into a hands-and-knees position on a firm surface. Keep your hands under your shoulders, and keep your knees under your hips. You may place padding under your knees for comfort. 2. Let your head hang down toward your chest. Contract your abdominal muscles and point your tailbone toward the floor so your lower back becomes rounded like the back of a cat. 3. Hold this position for 5 seconds. 4. Slowly lift your head, let your abdominal muscles relax and point your tailbone up toward the ceiling so your back forms a sagging arch like the back of a cow. 5. Hold this position for 5 seconds.  Press-ups Repeat these steps 5-10 times: 1. Lie on your abdomen (face-down) on the floor. 2. Place your palms near your head, about shoulder-width apart. 3. Keeping your back as relaxed as possible and keeping your hips on the floor, slowly straighten your arms to raise the top half of your body and lift your shoulders. Do not use your back muscles to raise your upper torso. You may adjust the  placement of your hands to make yourself more comfortable. 4. Hold this position for 5 seconds while you keep your back relaxed. 5. Slowly return to lying flat on the floor.  Bridges Repeat these steps 10 times: 1. Lie on your back on a firm surface. 2. Bend your knees so they are pointing toward the ceiling and your feet are flat on the floor. Your arms should be flat at your sides, next to your body. 3. Tighten your buttocks muscles and lift your buttocks off the floor until your waist is at almost the same height as your knees. You should feel the muscles working in your  buttocks and the back of your thighs. If you do not feel these muscles, slide your feet 1-2 inches farther away from your buttocks. 4. Hold this position for 3-5 seconds. 5. Slowly lower your hips to the starting position, and allow your buttocks muscles to relax completely. If this exercise is too easy, try doing it with your arms crossed over your chest. Abdominal crunches Repeat these steps 5-10 times: 1. Lie on your back on a firm bed or the floor with your legs extended. 2. Bend your knees so they are pointing toward the ceiling and your feet are flat on the floor. 3. Cross your arms over your chest. 4. Tip your chin slightly toward your chest without bending your neck. 5. Tighten your abdominal muscles and slowly raise your trunk (torso) high enough to lift your shoulder blades a tiny bit off the floor. Avoid raising your torso higher than that because it can put too much stress on your low back and does not help to strengthen your abdominal muscles. 6. Slowly return to your starting position. Back lifts Repeat these steps 5-10 times: 1. Lie on your abdomen (face-down) with your arms at your sides, and rest your forehead on the floor. 2. Tighten the muscles in your legs and your buttocks. 3. Slowly lift your chest off the floor while you keep your hips pressed to the floor. Keep the back of your head in line with the curve in your back. Your eyes should be looking at the floor. 4. Hold this position for 3-5 seconds. 5. Slowly return to your starting position. Contact a health care provider if:  Your back pain or discomfort gets much worse when you do an exercise.  Your worsening back pain or discomfort does not lessen within 2 hours after you exercise. If you have any of these problems, stop doing these exercises right away. Do not do them again unless your health care provider says that you can. Get help right away if:  You develop sudden, severe back pain. If this happens, stop  doing the exercises right away. Do not do them again unless your health care provider says that you can. This information is not intended to replace advice given to you by your health care provider. Make sure you discuss any questions you have with your health care provider. Document Revised: 11/10/2018 Document Reviewed: 04/07/2018 Elsevier Patient Education  [place].

## 2019-11-17 ENCOUNTER — Ambulatory Visit (INDEPENDENT_AMBULATORY_CARE_PROVIDER_SITE_OTHER): Payer: Medicare Other | Admitting: Cardiology

## 2019-11-17 ENCOUNTER — Other Ambulatory Visit: Payer: Self-pay

## 2019-11-17 ENCOUNTER — Encounter: Payer: Self-pay | Admitting: Cardiology

## 2019-11-17 VITALS — BP 142/90 | HR 71 | Ht 63.0 in | Wt 177.5 lb

## 2019-11-17 DIAGNOSIS — K2100 Gastro-esophageal reflux disease with esophagitis, without bleeding: Secondary | ICD-10-CM

## 2019-11-17 DIAGNOSIS — E7800 Pure hypercholesterolemia, unspecified: Secondary | ICD-10-CM

## 2019-11-17 NOTE — Progress Notes (Signed)
Cardiology Office Note:    Date:  11/17/2019   ID:  Maria Patterson, DOB 1940-08-28, MRN [id number]  PCP:  Maria Patterson, Maria Glow, MD  Cardiologist:  Maria Sable, MD  Electrophysiologist:  None   Referring MD: Maria Patterson, Maria Patterson *   Chief Complaint  Patient presents with  . office visit    6 month F/U; Meds verbally reviewed with patient.    History of Present Illness:    Maria Patterson is a 79 y.o. female with a hx of obesity, hyperlipidemia, GERD who presents for follow-up.  She was last seen due to high cholesterol levels.  A starting dose of Lipitor was increased to 40 mg daily.  Low cholesterol diet was advised.  Cholesterol levels were supposed to be obtained prior to today's visit, but patient did not have them done.  He otherwise feels well, denies any chest pain or shortness of breath, has knee discomfort due to arthritis when she sits for too long.  She is currently fasting, last meal was dinner last night.  She endorses not eating low-cholesterol diet.   Past Medical History:  Diagnosis Date  . Anemia   . Breast cancer ([place]) 2018   Left  . Breast mass, left 09/22/2016   RECOMMENDATION: Ultrasound-guided core biopsies of masses at left breast 2 o'clock 2 cm from nipple, left breast 2 o'clock 5 cm from nipple, abnormal left axillary lymph nodes.  Marland Kitchen GERD (gastroesophageal reflux disease)   . Hyperglycemia   . Hyperlipidemia   . Obesity   . Personal history of chemotherapy 2018   Left breast  . Personal history of radiation therapy 2018  . Postmenopausal   . Shingles    right eye     Past Surgical History:  Procedure Laterality Date  . ABDOMINAL HYSTERECTOMY    . AXILLARY SENTINEL NODE BIOPSY Left 04/14/2017   Procedure: AXILLARY SENTINEL NODE BIOPSY;  Surgeon: Maria Ree, MD;  Location: [facility] ORS;  Service: General;  Laterality: Left;  . BREAST BIOPSY Left 09/30/2016   US biopsy of 3 areas + chemo  . BREAST EXCISIONAL BIOPSY Left 04/14/2017   Left  breast invasive cancer left mastectomy  . ESOPHAGOGASTRODUODENOSCOPY (EGD) WITH PROPOFOL N/A 12/24/2014   Procedure: ESOPHAGOGASTRODUODENOSCOPY (EGD) WITH PROPOFOL;  Surgeon: Maria Silvas, MD;  Location: [facility] ENDOSCOPY;  Service: Endoscopy;  Laterality: N/A;  . MASTECTOMY Left 04/14/2017   Left breast invasive cancer  . PORTACATH PLACEMENT Right 10/27/2016   Procedure: INSERTION PORT-A-CATH;  Surgeon: Maria Lewandowsky, MD;  Location: [facility] ORS;  Service: General;  Laterality: Right;  . TONSILECTOMY, ADENOIDECTOMY, BILATERAL MYRINGOTOMY AND TUBES    . TONSILLECTOMY    . TOTAL MASTECTOMY Left 04/14/2017   Procedure: TOTAL MASTECTOMY;  Surgeon: Maria Ree, MD;  Location: [facility] ORS;  Service: General;  Laterality: Left;    Current Medications: Current Meds  Medication Sig  . acetaminophen (TYLENOL) 325 MG tablet Take 2 tablets (650 mg total) by mouth every 6 (six) hours as needed.  Marland Kitchen aspirin 81 MG tablet Take 81 mg by mouth daily.  Marland Kitchen atorvastatin (LIPITOR) 40 MG tablet Take 1 tablet (40 mg total) by mouth daily.  . Cholecalciferol (VITAMIN D) 2000 units tablet Take 2,000 Units by mouth 3 (three) times a week.   . diclofenac Sodium (VOLTAREN) 1 % GEL Apply 2-4 g topically 4 (four) times daily. 2 grams upper body and 4 grams lower body qid  . ezetimibe (ZETIA) 10 MG tablet Take 1 tablet (10 mg total) by mouth daily.  Marland Kitchen  ferrous sulfate 325 (65 FE) MG EC tablet Take 325 mg by mouth 3 (three) times a week.   . fluticasone (FLONASE) 50 MCG/ACT nasal spray Place 2 sprays into both nostrils daily as needed for allergies or rhinitis.  Marland Kitchen levocetirizine (XYZAL) 5 MG tablet Take 0.5 tablets (2.5 mg total) by mouth every evening.  . Multiple Vitamin (MULTIVITAMIN WITH MINERALS) TABS tablet Take 1 tablet by mouth 3 (three) times a week.   . pantoprazole (PROTONIX) 20 MG tablet Take 1 tablet (20 mg total) by mouth daily. 30 minutes before food  . Polyethyl Glycol-Propyl Glycol (SYSTANE OP) Place 1 drop into  both eyes daily as needed (dry eyes).   . sodium chloride (OCEAN) 0.65 % SOLN nasal spray Place 1 spray into both nostrils as needed for congestion.      Allergies:   Latex and Penicillins   Social History   Socioeconomic History  . Marital status: Widowed    Spouse name: Not on file  . Number of children: 1  . Years of education: GED  . Highest education level: 12th grade  Occupational History  . Occupation: Retired  Tobacco Use  . Smoking status: Never Smoker  . Smokeless tobacco: Never Used  . Tobacco comment: smoking cessation materials not required  Substance and Sexual Activity  . Alcohol use: No    Alcohol/week: 0.0 standard drinks  . Drug use: No  . Sexual activity: Not Currently  Other Topics Concern  . Not on file  Social History Narrative   Lives at home    1 kid Maria Patterson (daughter) [phone]   Social Determinants of Health   Financial Resource Strain:   . Difficulty of Paying Living Expenses:   Food Insecurity:   . Worried About Charity fundraiser in the Last Year:   . Arboriculturist in the Last Year:   Transportation Needs:   . Film/video editor (Medical):   Marland Kitchen Lack of Transportation (Non-Medical):   Physical Activity:   . Days of Exercise per Week:   . Minutes of Exercise per Session:   Stress:   . Feeling of Stress :   Social Connections:   . Frequency of Communication with Friends and Family:   . Frequency of Social Gatherings with Friends and Family:   . Attends Religious Services:   . Active Member of Clubs or Organizations:   . Attends Archivist Meetings:   Marland Kitchen Marital Status:      Family History: The patient's family history includes Stroke in her father and mother. There is no history of Cancer, Depression, or Breast cancer.  ROS:   Please see the history of present illness.     All other systems reviewed and are negative.  EKGs/Labs/Other Studies Reviewed:    The following studies were reviewed today:  EKG:   EKG is  ordered today.  The ekg ordered today demonstrates normal sinus rhythm, nonspecific T wave changes.  Recent Labs: 06/27/2019: ALT 13; BUN 22; Creatinine, Ser 1.34; Hemoglobin 12.3; Platelets 215.0; Potassium 4.2; Sodium 142; TSH 2.49  Recent Lipid Panel    Component Value Date/Time   CHOL 301 (H) 06/27/2019 0835   CHOL 318 (H) 10/23/2015 0845   TRIG 160.0 (H) 06/27/2019 0835   HDL 65.70 06/27/2019 0835   HDL 69 10/23/2015 0845   CHOLHDL 5 06/27/2019 0835   VLDL 32.0 06/27/2019 0835   LDLCALC 203 (H) 06/27/2019 0835   LDLCALC 197 (H) 02/17/2019 1618  Physical Exam:    VS:  BP (!) 142/90 (BP Location: Right Arm, Patient Position: Sitting, Cuff Size: Normal)   Pulse 71   Ht 5\' 3"  (1.6 m)   Wt 177 lb 8 oz (80.5 kg)   SpO2 98%   BMI 31.44 kg/m     Wt Readings from Last 3 Encounters:  11/17/19 177 lb 8 oz (80.5 kg)  10/17/19 176 lb (79.8 kg)  10/04/19 176 lb 9.6 oz (80.1 kg)     GEN:  Well nourished, well developed in no acute distress HEENT: Normal NECK: No JVD; No carotid bruits LYMPHATICS: No lymphadenopathy CARDIAC: RRR, no murmurs, rubs, gallops RESPIRATORY:  Clear to auscultation without rales, wheezing or rhonchi  ABDOMEN: Soft, non-tender, non-distended MUSCULOSKELETAL:  No edema in lower extremities; No deformity, compression stockings noted in left upper extremity. SKIN: Warm and dry NEUROLOGIC:  Alert and oriented x 3 PSYCHIATRIC:  Normal affect   ASSESSMENT:    1. Pure hypercholesterolemia   2. Gastroesophageal reflux disease with esophagitis without hemorrhage    PLAN:    In order of problems listed above:  1. History of hyperlipidemia, last ASCVD risk score of 15.5.  Due to age, moderate intensity statin advised.  Continue Lipitor to 40 mg daily, Zetia 10 mg daily plan to get fasting lipid panel in the office today as patient is currently fasting..  Continue heart healthy low-cholesterol diet.. 2. History of reflux, Protonix helping with her  symptoms.  Continue Protonix 20 mg daily.  Follow-up in 6 months   Medication Adjustments/Labs and Tests Ordered: Current medicines are reviewed at length with the patient today.  Concerns regarding medicines are outlined above.  Orders Placed This Encounter  Procedures  . Lipid Profile  . EKG 12-Lead   No orders of the defined types were placed in this encounter.   Patient Instructions  Medication Instructions:  - Your physician recommends that you continue on your current medications as directed. Please refer to the Current Medication list given to you today.  *If you need a refill on your cardiac medications before your next appointment, please call your pharmacy*   Lab Work: - Your physician recommends that you a FASTING lipid profile: today  If you have labs (blood work) drawn today and your tests are completely normal, you will receive your results only by: Marland Kitchen MyChart Message (if you have MyChart) OR . A paper copy in the mail If you have any lab test that is abnormal or we need to change your treatment, we will call you to review the results.   Testing/Procedures: - none ordered   Follow-Up: At [facility], you and your health needs are our priority.  As part of our continuing mission to provide you with exceptional heart care, we have created designated Provider Care Teams.  These Care Teams include your primary Cardiologist (physician) and Advanced Practice Providers (APPs -  Physician Assistants and Nurse Practitioners) who all work together to provide you with the care you need, when you need it.  We recommend signing up for the patient portal called "MyChart".  Sign up information is provided on this After Visit Summary.  MyChart is used to connect with patients for Virtual Visits (Telemedicine).  Patients are able to view lab/test results, encounter notes, upcoming appointments, etc.  Non-urgent messages can be sent to your provider as well.   To learn more about  what you can do with MyChart, go to [email or web address].    Your next appointment:  6 month(s)  The format for your next appointment:   In Person  Provider:   Kate Sable, MD   Other Instructions N/a    Fat and Cholesterol Restricted Eating Plan Getting too much fat and cholesterol in your diet may cause health problems. Choosing the right foods helps keep your fat and cholesterol at normal levels. This can keep you from getting certain diseases. What are tips for following this plan? Meal planning 1. At meals, divide your plate into four equal parts: ? Fill one-half of your plate with vegetables and green salads. ? Fill one-fourth of your plate with whole grains. ? Fill one-fourth of your plate with low-fat (lean) protein foods. 2. Eat fish that is high in omega-3 fats at least two times a week. This includes mackerel, tuna, sardines, and salmon. 3. Eat foods that are high in fiber, such as whole grains, beans, apples, broccoli, carrots, peas, and barley. General tips  1. Work with your doctor to lose weight if you need to. 2. Avoid: ? Foods with added sugar. ? Fried foods. ? Foods with partially hydrogenated oils. 3. Limit alcohol intake to no more than 1 drink a day for nonpregnant women and 2 drinks a day for men. One drink equals 12 oz of beer, 5 oz of wine, or 1 oz of hard liquor. Reading food labels 1. Check food labels for: ? Trans fats. ? Partially hydrogenated oils. ? Saturated fat (g) in each serving. ? Cholesterol (mg) in each serving. ? Fiber (g) in each serving. 2. Choose foods with healthy fats, such as: ? Monounsaturated fats. ? Polyunsaturated fats. ? Omega-3 fats. 3. Choose grain products that have whole grains. Look for the word "whole" as the first word in the ingredient list. Cooking  Cook foods using low-fat methods. These include baking, boiling, grilling, and broiling.  Eat more home-cooked foods. Eat at restaurants and buffets  less often.  Avoid cooking using saturated fats, such as butter, cream, palm oil, palm kernel oil, and coconut oil. Recommended foods  Fruits  All fresh, canned (in natural juice), or frozen fruits. Vegetables  Fresh or frozen vegetables (raw, steamed, roasted, or grilled). Green salads. Grains  Whole grains, such as whole wheat or whole grain breads, crackers, cereals, and pasta. Unsweetened oatmeal, bulgur, barley, quinoa, or brown rice. Corn or whole wheat flour tortillas. Meats and other protein foods  Ground beef (85% or leaner), grass-fed beef, or beef trimmed of fat. Skinless chicken or [place]. Ground chicken or [place]. Pork trimmed of fat. All fish and seafood. Egg whites. Dried beans, peas, or lentils. Unsalted nuts or seeds. Unsalted canned beans. Nut butters without added sugar or oil. Dairy  Low-fat or nonfat dairy products, such as skim or 1% milk, 2% or reduced-fat cheeses, low-fat and fat-free ricotta or cottage cheese, or plain low-fat and nonfat yogurt. Fats and oils  Tub margarine without trans fats. Light or reduced-fat mayonnaise and salad dressings. Avocado. Olive, canola, sesame, or safflower oils. The items listed above may not be a complete list of foods and beverages you can eat. Contact a dietitian for more information. Foods to avoid Fruits  Canned fruit in heavy syrup. Fruit in cream or butter sauce. Fried fruit. Vegetables  Vegetables cooked in cheese, cream, or butter sauce. Fried vegetables. Grains  White bread. White pasta. White rice. Cornbread. Bagels, pastries, and croissants. Crackers and snack foods that contain trans fat and hydrogenated oils. Meats and other protein foods  Fatty cuts of meat. Ribs, chicken  wings, bacon, sausage, bologna, salami, chitterlings, fatback, hot dogs, bratwurst, and packaged lunch meats. Liver and organ meats. Whole eggs and egg yolks. Chicken and [place] with skin. Fried meat. Dairy  Whole or 2% milk, cream,  half-and-half, and cream cheese. Whole milk cheeses. Whole-fat or sweetened yogurt. Full-fat cheeses. Nondairy creamers and whipped toppings. Processed cheese, cheese spreads, and cheese curds. Beverages  Alcohol. Sugar-sweetened drinks such as sodas, lemonade, and fruit drinks. Fats and oils  Butter, stick margarine, lard, shortening, ghee, or bacon fat. Coconut, palm kernel, and palm oils. Sweets and desserts  Corn syrup, sugars, honey, and molasses. Candy. Jam and jelly. Syrup. Sweetened cereals. Cookies, pies, cakes, donuts, muffins, and ice cream. The items listed above may not be a complete list of foods and beverages you should avoid. Contact a dietitian for more information. Summary  Choosing the right foods helps keep your fat and cholesterol at normal levels. This can keep you from getting certain diseases.  At meals, fill one-half of your plate with vegetables and green salads.  Eat high-fiber foods, like whole grains, beans, apples, carrots, peas, and barley.  Limit added sugar, saturated fats, alcohol, and fried foods. This information is not intended to replace advice given to you by your health care provider. Make sure you discuss any questions you have with your health care provider. Document Revised: 03/09/2018 Document Reviewed: 03/23/2017 Elsevier Patient Education  2020 [place], Maria Sable, MD  11/17/2019 11:19 AM    [place]

## 2019-11-17 NOTE — Patient Instructions (Signed)
Medication Instructions:  - Your physician recommends that you continue on your current medications as directed. Please refer to the Current Medication list given to you today.  *If you need a refill on your cardiac medications before your next appointment, please call your pharmacy*   Lab Work: - Your physician recommends that you a FASTING lipid profile: today  If you have labs (blood work) drawn today and your tests are completely normal, you will receive your results only by: Marland Kitchen MyChart Message (if you have MyChart) OR . A paper copy in the mail If you have any lab test that is abnormal or we need to change your treatment, we will call you to review the results.   Testing/Procedures: - none ordered   Follow-Up: At [facility], you and your health needs are our priority.  As part of our continuing mission to provide you with exceptional heart care, we have created designated Provider Care Teams.  These Care Teams include your primary Cardiologist (physician) and Advanced Practice Providers (APPs -  Physician Assistants and Nurse Practitioners) who all work together to provide you with the care you need, when you need it.  We recommend signing up for the patient portal called "MyChart".  Sign up information is provided on this After Visit Summary.  MyChart is used to connect with patients for Virtual Visits (Telemedicine).  Patients are able to view lab/test results, encounter notes, upcoming appointments, etc.  Non-urgent messages can be sent to your provider as well.   To learn more about what you can do with MyChart, go to [email or web address].    Your next appointment:   6 month(s)  The format for your next appointment:   In Person  Provider:   Kate Sable, MD   Other Instructions N/a    Fat and Cholesterol Restricted Eating Plan Getting too much fat and cholesterol in your diet may cause health problems. Choosing the right foods helps keep your fat and  cholesterol at normal levels. This can keep you from getting certain diseases. What are tips for following this plan? Meal planning 1. At meals, divide your plate into four equal parts: ? Fill one-half of your plate with vegetables and green salads. ? Fill one-fourth of your plate with whole grains. ? Fill one-fourth of your plate with low-fat (lean) protein foods. 2. Eat fish that is high in omega-3 fats at least two times a week. This includes mackerel, tuna, sardines, and salmon. 3. Eat foods that are high in fiber, such as whole grains, beans, apples, broccoli, carrots, peas, and barley. General tips  1. Work with your doctor to lose weight if you need to. 2. Avoid: ? Foods with added sugar. ? Fried foods. ? Foods with partially hydrogenated oils. 3. Limit alcohol intake to no more than 1 drink a day for nonpregnant women and 2 drinks a day for men. One drink equals 12 oz of beer, 5 oz of wine, or 1 oz of hard liquor. Reading food labels 1. Check food labels for: ? Trans fats. ? Partially hydrogenated oils. ? Saturated fat (g) in each serving. ? Cholesterol (mg) in each serving. ? Fiber (g) in each serving. 2. Choose foods with healthy fats, such as: ? Monounsaturated fats. ? Polyunsaturated fats. ? Omega-3 fats. 3. Choose grain products that have whole grains. Look for the word "whole" as the first word in the ingredient list. Cooking  Cook foods using low-fat methods. These include baking, boiling, grilling, and broiling.  Eat more home-cooked  foods. Eat at restaurants and buffets less often.  Avoid cooking using saturated fats, such as butter, cream, palm oil, palm kernel oil, and coconut oil. Recommended foods  Fruits  All fresh, canned (in natural juice), or frozen fruits. Vegetables  Fresh or frozen vegetables (raw, steamed, roasted, or grilled). Green salads. Grains  Whole grains, such as whole wheat or whole grain breads, crackers, cereals, and pasta.  Unsweetened oatmeal, bulgur, barley, quinoa, or brown rice. Corn or whole wheat flour tortillas. Meats and other protein foods  Ground beef (85% or leaner), grass-fed beef, or beef trimmed of fat. Skinless chicken or [place]. Ground chicken or [place]. Pork trimmed of fat. All fish and seafood. Egg whites. Dried beans, peas, or lentils. Unsalted nuts or seeds. Unsalted canned beans. Nut butters without added sugar or oil. Dairy  Low-fat or nonfat dairy products, such as skim or 1% milk, 2% or reduced-fat cheeses, low-fat and fat-free ricotta or cottage cheese, or plain low-fat and nonfat yogurt. Fats and oils  Tub margarine without trans fats. Light or reduced-fat mayonnaise and salad dressings. Avocado. Olive, canola, sesame, or safflower oils. The items listed above may not be a complete list of foods and beverages you can eat. Contact a dietitian for more information. Foods to avoid Fruits  Canned fruit in heavy syrup. Fruit in cream or butter sauce. Fried fruit. Vegetables  Vegetables cooked in cheese, cream, or butter sauce. Fried vegetables. Grains  White bread. White pasta. White rice. Cornbread. Bagels, pastries, and croissants. Crackers and snack foods that contain trans fat and hydrogenated oils. Meats and other protein foods  Fatty cuts of meat. Ribs, chicken wings, bacon, sausage, bologna, salami, chitterlings, fatback, hot dogs, bratwurst, and packaged lunch meats. Liver and organ meats. Whole eggs and egg yolks. Chicken and [place] with skin. Fried meat. Dairy  Whole or 2% milk, cream, half-and-half, and cream cheese. Whole milk cheeses. Whole-fat or sweetened yogurt. Full-fat cheeses. Nondairy creamers and whipped toppings. Processed cheese, cheese spreads, and cheese curds. Beverages  Alcohol. Sugar-sweetened drinks such as sodas, lemonade, and fruit drinks. Fats and oils  Butter, stick margarine, lard, shortening, ghee, or bacon fat. Coconut, palm kernel, and palm  oils. Sweets and desserts  Corn syrup, sugars, honey, and molasses. Candy. Jam and jelly. Syrup. Sweetened cereals. Cookies, pies, cakes, donuts, muffins, and ice cream. The items listed above may not be a complete list of foods and beverages you should avoid. Contact a dietitian for more information. Summary  Choosing the right foods helps keep your fat and cholesterol at normal levels. This can keep you from getting certain diseases.  At meals, fill one-half of your plate with vegetables and green salads.  Eat high-fiber foods, like whole grains, beans, apples, carrots, peas, and barley.  Limit added sugar, saturated fats, alcohol, and fried foods. This information is not intended to replace advice given to you by your health care provider. Make sure you discuss any questions you have with your health care provider. Document Revised: 03/09/2018 Document Reviewed: 03/23/2017 Elsevier Patient Education  [place].

## 2019-11-18 LAB — LIPID PANEL
Chol/HDL Ratio: 3.8 ratio (ref 0.0–4.4)
Cholesterol, Total: 250 mg/dL — ABNORMAL HIGH (ref 100–199)
HDL: 66 mg/dL (ref >39)
LDL Chol Calc (NIH): 159 mg/dL — ABNORMAL HIGH (ref 0–99)
Triglycerides: 139 mg/dL (ref 0–149)
VLDL Cholesterol Cal: 25 mg/dL (ref 5–40)

## 2019-12-25 DIAGNOSIS — N1831 Chronic kidney disease, stage 3a: Secondary | ICD-10-CM | POA: Diagnosis not present

## 2019-12-25 DIAGNOSIS — I10 Essential (primary) hypertension: Secondary | ICD-10-CM | POA: Diagnosis not present

## 2020-01-11 ENCOUNTER — Other Ambulatory Visit (INDEPENDENT_AMBULATORY_CARE_PROVIDER_SITE_OTHER): Payer: Medicare Other

## 2020-01-11 ENCOUNTER — Other Ambulatory Visit: Payer: Self-pay

## 2020-01-11 DIAGNOSIS — E785 Hyperlipidemia, unspecified: Secondary | ICD-10-CM | POA: Diagnosis not present

## 2020-01-11 DIAGNOSIS — Z853 Personal history of malignant neoplasm of breast: Secondary | ICD-10-CM | POA: Diagnosis not present

## 2020-01-11 LAB — CBC WITH DIFFERENTIAL/PLATELET
Basophils Absolute: 0 10*3/uL (ref 0.0–0.1)
Basophils Relative: 0.8 % (ref 0.0–3.0)
Eosinophils Absolute: 0.3 10*3/uL (ref 0.0–0.7)
Eosinophils Relative: 4.6 % (ref 0.0–5.0)
HCT: 36.6 % (ref 36.0–46.0)
Hemoglobin: 12.1 g/dL (ref 12.0–15.0)
Lymphocytes Relative: 40 % (ref 12.0–46.0)
Lymphs Abs: 2.3 10*3/uL (ref 0.7–4.0)
MCHC: 33.2 g/dL (ref 30.0–36.0)
MCV: 91.9 fl (ref 78.0–100.0)
Monocytes Absolute: 0.4 10*3/uL (ref 0.1–1.0)
Monocytes Relative: 6.6 % (ref 3.0–12.0)
Neutro Abs: 2.7 10*3/uL (ref 1.4–7.7)
Neutrophils Relative %: 48 % (ref 43.0–77.0)
Platelets: 199 10*3/uL (ref 150.0–400.0)
RBC: 3.99 Mil/uL (ref 3.87–5.11)
RDW: 14.8 % (ref 11.5–15.5)
WBC: 5.7 10*3/uL (ref 4.0–10.5)

## 2020-01-11 LAB — COMPREHENSIVE METABOLIC PANEL
ALT: 16 U/L (ref 0–35)
AST: 20 U/L (ref 0–37)
Albumin: 3.9 g/dL (ref 3.5–5.2)
Alkaline Phosphatase: 74 U/L (ref 39–117)
BUN: 23 mg/dL (ref 6–23)
CO2: 25 mEq/L (ref 19–32)
Calcium: 9.5 mg/dL (ref 8.4–10.5)
Chloride: 111 mEq/L (ref 96–112)
Creatinine, Ser: 1.32 mg/dL — ABNORMAL HIGH (ref 0.40–1.20)
GFR: 46.79 mL/min — ABNORMAL LOW (ref >60.00)
Glucose, Bld: 89 mg/dL (ref 70–99)
Potassium: 4.3 mEq/L (ref 3.5–5.1)
Sodium: 141 mEq/L (ref 135–145)
Total Bilirubin: 0.4 mg/dL (ref 0.2–1.2)
Total Protein: 6.9 g/dL (ref 6.0–8.3)

## 2020-01-11 LAB — LIPID PANEL
Cholesterol: 245 mg/dL — ABNORMAL HIGH (ref 0–200)
HDL: 56.1 mg/dL (ref >39.00)
LDL Cholesterol: 165 mg/dL — ABNORMAL HIGH (ref 0–99)
NonHDL: 189.35
Total CHOL/HDL Ratio: 4
Triglycerides: 122 mg/dL (ref 0.0–149.0)
VLDL: 24.4 mg/dL (ref 0.0–40.0)

## 2020-01-18 ENCOUNTER — Ambulatory Visit (INDEPENDENT_AMBULATORY_CARE_PROVIDER_SITE_OTHER): Payer: Medicare Other | Admitting: Internal Medicine

## 2020-01-18 ENCOUNTER — Encounter: Payer: Self-pay | Admitting: Internal Medicine

## 2020-01-18 ENCOUNTER — Other Ambulatory Visit: Payer: Self-pay

## 2020-01-18 VITALS — BP 126/76 | HR 69 | Temp 98.0°F | Ht 63.0 in | Wt 174.2 lb

## 2020-01-18 DIAGNOSIS — M25551 Pain in right hip: Secondary | ICD-10-CM | POA: Diagnosis not present

## 2020-01-18 DIAGNOSIS — K219 Gastro-esophageal reflux disease without esophagitis: Secondary | ICD-10-CM

## 2020-01-18 DIAGNOSIS — D369 Benign neoplasm, unspecified site: Secondary | ICD-10-CM

## 2020-01-18 DIAGNOSIS — M25562 Pain in left knee: Secondary | ICD-10-CM

## 2020-01-18 DIAGNOSIS — Z1211 Encounter for screening for malignant neoplasm of colon: Secondary | ICD-10-CM

## 2020-01-18 DIAGNOSIS — M25561 Pain in right knee: Secondary | ICD-10-CM

## 2020-01-18 DIAGNOSIS — M25552 Pain in left hip: Secondary | ICD-10-CM

## 2020-01-18 DIAGNOSIS — E785 Hyperlipidemia, unspecified: Secondary | ICD-10-CM | POA: Diagnosis not present

## 2020-01-18 DIAGNOSIS — G8929 Other chronic pain: Secondary | ICD-10-CM

## 2020-01-18 HISTORY — DX: Benign neoplasm, unspecified site: D36.9

## 2020-01-18 MED ORDER — ATORVASTATIN CALCIUM 40 MG PO TABS: 40.0000 mg | ORAL_TABLET | ORAL | 3 refills | Status: DC

## 2020-01-18 MED ORDER — PANTOPRAZOLE SODIUM 40 MG PO TBEC: 40.0000 mg | DELAYED_RELEASE_TABLET | Freq: Every day | ORAL | 3 refills | Status: DC

## 2020-01-18 NOTE — Progress Notes (Signed)
Patient presenting with leg soreness. States this is ongoing and has been for quite some time now.   Also states that Protonix is no longer helping her acid reflux.   Patient flagged for BMI Follow up/education due to BMI of 30.9 from height and weight documented today.

## 2020-01-18 NOTE — Progress Notes (Signed)
Chief Complaint  Patient presents with  . Follow-up  . Medication Management  . Leg Pain   F/u  1. HLD on zetia 10 and lipitor 40 mg qhs which she thinks maybe causing 8/10 leg pain leg pain lower legs, knees up to hips has voltaren gel at home to try in case this is arthritis   Results for BRIDGETT, HATTABAUGH (MRN [id number]) as of 01/18/2020 08:47  01/11/2020 08:02 Cholesterol: 245 (H) HDL Cholesterol: 56.10 LDL (calc): 165 (H) NonHDL: 189.35 Triglycerides: 122.0 VLDL: 24.4   2. GERD uncontrolled on protonix 20 agreeable increase dose h/o tubular adenoma wants referral [facility] never had repeat f/u colonoscopy in 2017  3. B/l hip pain and knee pain 8/10    Review of Systems  Constitutional: Negative for weight loss.  HENT: Negative for hearing loss.   Eyes: Negative for blurred vision.  Respiratory: Negative for sputum production.   Cardiovascular: Negative for chest pain.  Gastrointestinal: Positive for heartburn.  Musculoskeletal: Positive for joint pain.  Skin: Negative for rash.  Neurological: Negative for headaches.  Psychiatric/Behavioral: Negative for depression.   Past Medical History:  Diagnosis Date  . Anemia   . Breast cancer ([place]) 2018   Left  . Breast mass, left 09/22/2016   RECOMMENDATION: Ultrasound-guided core biopsies of masses at left breast 2 o'clock 2 cm from nipple, left breast 2 o'clock 5 cm from nipple, abnormal left axillary lymph nodes.  Marland Kitchen GERD (gastroesophageal reflux disease)   . Hyperglycemia   . Hyperlipidemia   . Obesity   . Personal history of chemotherapy 2018   Left breast  . Personal history of radiation therapy 2018  . Postmenopausal   . Shingles    right eye    Past Surgical History:  Procedure Laterality Date  . ABDOMINAL HYSTERECTOMY    . AXILLARY SENTINEL NODE BIOPSY Left 04/14/2017   Procedure: AXILLARY SENTINEL NODE BIOPSY;  Surgeon: Olean Ree, MD;  Location: [facility] ORS;  Service: General;  Laterality: Left;  . BREAST BIOPSY  Left 09/30/2016   US biopsy of 3 areas + chemo  . BREAST EXCISIONAL BIOPSY Left 04/14/2017   Left breast invasive cancer left mastectomy  . ESOPHAGOGASTRODUODENOSCOPY (EGD) WITH PROPOFOL N/A 12/24/2014   Procedure: ESOPHAGOGASTRODUODENOSCOPY (EGD) WITH PROPOFOL;  Surgeon: Manya Silvas, MD;  Location: [facility] ENDOSCOPY;  Service: Endoscopy;  Laterality: N/A;  . MASTECTOMY Left 04/14/2017   Left breast invasive cancer  . PORTACATH PLACEMENT Right 10/27/2016   Procedure: INSERTION PORT-A-CATH;  Surgeon: Nestor Lewandowsky, MD;  Location: [facility] ORS;  Service: General;  Laterality: Right;  . TONSILECTOMY, ADENOIDECTOMY, BILATERAL MYRINGOTOMY AND TUBES    . TONSILLECTOMY    . TOTAL MASTECTOMY Left 04/14/2017   Procedure: TOTAL MASTECTOMY;  Surgeon: Olean Ree, MD;  Location: [facility] ORS;  Service: General;  Laterality: Left;   Family History  Problem Relation Age of Onset  . Stroke Mother   . Stroke Father   . Cancer Neg Hx   . Depression Neg Hx   . Breast cancer Neg Hx    Social History   Socioeconomic History  . Marital status: Widowed    Spouse name: Not on file  . Number of children: 1  . Years of education: GED  . Highest education level: 12th grade  Occupational History  . Occupation: Retired  Tobacco Use  . Smoking status: Never Smoker  . Smokeless tobacco: Never Used  . Tobacco comment: smoking cessation materials not required  Vaping Use  . Vaping Use: Never used  Substance and Sexual Activity  . Alcohol use: No    Alcohol/week: 0.0 standard drinks  . Drug use: No  . Sexual activity: Not Currently  Other Topics Concern  . Not on file  Social History Narrative   Lives at home    1 kid [name] (daughter) [phone]   Social Determinants of Health   Financial Resource Strain:   . Difficulty of Paying Living Expenses:   Food Insecurity:   . Worried About Charity fundraiser in the Last Year:   . Arboriculturist in the Last Year:   Transportation Needs:   . Consulting civil engineer (Medical):   Marland Kitchen Lack of Transportation (Non-Medical):   Physical Activity:   . Days of Exercise per Week:   . Minutes of Exercise per Session:   Stress:   . Feeling of Stress :   Social Connections:   . Frequency of Communication with Friends and Family:   . Frequency of Social Gatherings with Friends and Family:   . Attends Religious Services:   . Active Member of Clubs or Organizations:   . Attends Archivist Meetings:   Marland Kitchen Marital Status:   Intimate Partner Violence:   . Fear of Current or Ex-Partner:   . Emotionally Abused:   Marland Kitchen Physically Abused:   . Sexually Abused:    Current Meds  Medication Sig  . acetaminophen (TYLENOL) 325 MG tablet Take 2 tablets (650 mg total) by mouth every 6 (six) hours as needed.  Marland Kitchen aspirin 81 MG tablet Take 81 mg by mouth daily.  Derrill Memo ON 01/19/2020] atorvastatin (LIPITOR) 40 MG tablet Take 1 tablet (40 mg total) by mouth every Monday, Wednesday, and Friday. At night  . Cholecalciferol (VITAMIN D) 2000 units tablet Take 2,000 Units by mouth 3 (three) times a week.   . diclofenac Sodium (VOLTAREN) 1 % GEL Apply 2-4 g topically 4 (four) times daily. 2 grams upper body and 4 grams lower body qid  . ezetimibe (ZETIA) 10 MG tablet Take 1 tablet (10 mg total) by mouth daily.  . ferrous sulfate 325 (65 FE) MG EC tablet Take 325 mg by mouth 3 (three) times a week.   . fluticasone (FLONASE) 50 MCG/ACT nasal spray Place 2 sprays into both nostrils daily as needed for allergies or rhinitis.  Marland Kitchen levocetirizine (XYZAL) 5 MG tablet Take 0.5 tablets (2.5 mg total) by mouth every evening.  . Multiple Vitamin (MULTIVITAMIN WITH MINERALS) TABS tablet Take 1 tablet by mouth 3 (three) times a week.   . pantoprazole (PROTONIX) 40 MG tablet Take 1 tablet (40 mg total) by mouth daily. 30 minutes before food  . Polyethyl Glycol-Propyl Glycol (SYSTANE OP) Place 1 drop into both eyes daily as needed (dry eyes).   . sodium chloride (OCEAN) 0.65 % SOLN  nasal spray Place 1 spray into both nostrils as needed for congestion.  . [DISCONTINUED] atorvastatin (LIPITOR) 40 MG tablet Take 1 tablet (40 mg total) by mouth daily.  . [DISCONTINUED] pantoprazole (PROTONIX) 20 MG tablet Take 1 tablet (20 mg total) by mouth daily. 30 minutes before food   Allergies  Allergen Reactions  . Latex Itching  . Penicillins Itching and Rash    Has patient had a PCN reaction causing immediate rash, facial/tongue/throat swelling, SOB or lightheadedness with hypotension: Yes Has patient had a PCN reaction causing severe rash involving mucus membranes or skin necrosis: No Has patient had a PCN reaction that required hospitalization No Has patient  had a PCN reaction occurring within the last 10 years: No If all of the above answers are "NO", then may proceed with Cephalosporin use.    Recent Results (from the past 2160 hour(s))  Lipid Profile     Status: Abnormal   Collection Time: 11/17/19  9:18 AM  Result Value Ref Range   Cholesterol, Total 250 (H) 100 - 199 mg/dL   Triglycerides 139 0 - 149 mg/dL   HDL 66 >39 mg/dL   VLDL Cholesterol Cal 25 5 - 40 mg/dL   LDL Chol Calc (NIH) 159 (H) 0 - 99 mg/dL   Chol/HDL Ratio 3.8 0.0 - 4.4 ratio    Comment:                                   T. Chol/HDL Ratio                                             Men  Women                               1/2 Avg.Risk  3.4    3.3                                   Avg.Risk  5.0    4.4                                2X Avg.Risk  9.6    7.1                                3X Avg.Risk 23.4   11.0   CBC with Differential/Platelet     Status: None   Collection Time: 01/11/20  8:02 AM  Result Value Ref Range   WBC 5.7 4.0 - 10.5 K/uL   RBC 3.99 3.87 - 5.11 Mil/uL   Hemoglobin 12.1 12.0 - 15.0 g/dL   HCT 36.6 36 - 46 %   MCV 91.9 78.0 - 100.0 fl   MCHC 33.2 30.0 - 36.0 g/dL   RDW 14.8 11.5 - 15.5 %   Platelets 199.0 150 - 400 K/uL   Neutrophils Relative % 48.0 43 - 77 %    Lymphocytes Relative 40.0 12 - 46 %   Monocytes Relative 6.6 3 - 12 %   Eosinophils Relative 4.6 0 - 5 %   Basophils Relative 0.8 0 - 3 %   Neutro Abs 2.7 1.4 - 7.7 K/uL   Lymphs Abs 2.3 0.7 - 4.0 K/uL   Monocytes Absolute 0.4 0 - 1 K/uL   Eosinophils Absolute 0.3 0 - 0 K/uL   Basophils Absolute 0.0 0 - 0 K/uL  Lipid panel     Status: Abnormal   Collection Time: 01/11/20  8:02 AM  Result Value Ref Range   Cholesterol 245 (H) 0 - 200 mg/dL    Comment: ATP III Classification       Desirable:  < 200 mg/dL               Borderline High:  200 - 239 mg/dL          High:  > = 240 mg/dL   Triglycerides 122.0 0 - 149 mg/dL    Comment: Normal:  <150 mg/dLBorderline High:  150 - 199 mg/dL   HDL 56.10 >39.00 mg/dL   VLDL 24.4 0.0 - 40.0 mg/dL   LDL Cholesterol 165 (H) 0 - 99 mg/dL   Total CHOL/HDL Ratio 4     Comment:                Men          Women1/2 Average Risk     3.4          3.3Average Risk          5.0          4.42X Average Risk          9.6          7.13X Average Risk          15.0          11.0                       NonHDL 189.35     Comment: NOTE:  Non-HDL goal should be 30 mg/dL higher than patient's LDL goal (i.e. LDL goal of < 70 mg/dL, would have non-HDL goal of < 100 mg/dL)  Comprehensive metabolic panel     Status: Abnormal   Collection Time: 01/11/20  8:02 AM  Result Value Ref Range   Sodium 141 135 - 145 mEq/L   Potassium 4.3 3.5 - 5.1 mEq/L   Chloride 111 96 - 112 mEq/L   CO2 25 19 - 32 mEq/L   Glucose, Bld 89 70 - 99 mg/dL   BUN 23 6 - 23 mg/dL   Creatinine, Ser 1.32 (H) 0.40 - 1.20 mg/dL   Total Bilirubin 0.4 0.2 - 1.2 mg/dL   Alkaline Phosphatase 74 39 - 117 U/L   AST 20 0 - 37 U/L   ALT 16 0 - 35 U/L   Total Protein 6.9 6.0 - 8.3 g/dL   Albumin 3.9 3.5 - 5.2 g/dL   GFR 46.79 (L) >60.00 mL/min   Calcium 9.5 8.4 - 10.5 mg/dL   Objective  Body mass index is 30.86 kg/m. Wt Readings from Last 3 Encounters:  01/18/20 174 lb 3.2 oz (79 kg)  11/17/19 177 lb 8 oz  (80.5 kg)  10/17/19 176 lb (79.8 kg)   Temp Readings from Last 3 Encounters:  01/18/20 98 F (36.7 C) (Oral)  10/17/19 (!) 97.4 F (36.3 C) (Temporal)  10/04/19 97.7 F (36.5 C)   BP Readings from Last 3 Encounters:  01/18/20 126/76  11/17/19 (!) 142/90  10/17/19 126/76   Pulse Readings from Last 3 Encounters:  01/18/20 69  11/17/19 71  10/17/19 86    Physical Exam Vitals and nursing note reviewed.  Constitutional:      Appearance: Normal appearance. She is well-developed and well-groomed.  HENT:     Head: Normocephalic and atraumatic.  Eyes:     Conjunctiva/sclera: Conjunctivae normal.     Pupils: Pupils are equal, round, and reactive to light.  Cardiovascular:     Rate and Rhythm: Normal rate and regular rhythm.     Heart sounds: Normal heart sounds. No murmur heard.   Pulmonary:     Effort: Pulmonary effort is normal.     Breath sounds: Normal breath sounds.  Skin:  General: Skin is warm and dry.  Neurological:     General: No focal deficit present.     Mental Status: She is alert and oriented to person, place, and time. Mental status is at baseline.     Gait: Gait normal.  Psychiatric:        Attention and Perception: Attention and perception normal.        Mood and Affect: Mood and affect normal.        Speech: Speech normal.        Behavior: Behavior normal. Behavior is cooperative.        Thought Content: Thought content normal.        Cognition and Memory: Cognition and memory normal.        Judgment: Judgment normal.     Assessment  Plan  Gastroesophageal reflux disease - Plan: pantoprazole (PROTONIX) 40 MG tablet, Ambulatory referral to Gastroenterology  Hyperlipidemia, unspecified hyperlipidemia type - Plan: atorvastatin (LIPITOR) 40 MG tablet qhs to MWF with zetia  Disc repatha can disc further with cards 05/2020 declines for now  Tubular adenoma - Plan: Ambulatory referral to Gastroenterology  Colon cancer screening - Plan: Ambulatory  referral to Gastroenterology  Bilateral hip pain - Plan: DG Hip Unilat W OR W/O Pelvis 2-3 Views Right, DG Hip Unilat W OR W/O Pelvis 2-3 Views Left  Chronic pain of both knees - Plan: DG Knee Complete 4 Views Left, DG Knee Complete 4 Views Right   HM Flu shotutd prevnar and pna 23 utd  Tdap and shingrix disc in futuredisc today she thinks she has had covid vx had 2/2   S/p left mastectomy3/9/10right mammonegative 09/26/18 reordered 2021  Out of age window pap  Colonoscopy had years ago Dr. Tiffany Kocher -02/04/11 sessile tubular adenoma/IH; EGD duodenal polyp 12/24/14 neg path, hiatal hernia Referred [facility]   DEXA+osteoporosistry to get prolia approved had 09/2019 2nd dose in 03/2020 due    Dr. Candiss Norse 12/2019 f/u sch  01/18/20 PHq 9 score 6 and GAD 7 score 0   Provider: Dr. Olivia Mackie McLean-Scocuz[facility]

## 2020-01-18 NOTE — Patient Instructions (Addendum)
Gastroesophageal Reflux Disease, Adult Gastroesophageal reflux (GER) happens when acid from the stomach flows up into the tube that connects the mouth and the stomach (esophagus). Normally, food travels down the esophagus and stays in the stomach to be digested. However, when a person has GER, food and stomach acid sometimes move back up into the esophagus. If this becomes a more serious problem, the person may be diagnosed with a disease called gastroesophageal reflux disease (GERD). GERD occurs when the reflux:  Happens often.  Causes frequent or severe symptoms.  Causes problems such as damage to the esophagus. When stomach acid comes in contact with the esophagus, the acid may cause soreness (inflammation) in the esophagus. Over time, GERD may create small holes (ulcers) in the lining of the esophagus. What are the causes? This condition is caused by a problem with the muscle between the esophagus and the stomach (lower esophageal sphincter, or LES). Normally, the LES muscle closes after food passes through the esophagus to the stomach. When the LES is weakened or abnormal, it does not close properly, and that allows food and stomach acid to go back up into the esophagus. The LES can be weakened by certain dietary substances, medicines, and medical conditions, including:  Tobacco use.  Pregnancy.  Having a hiatal hernia.  Alcohol use.  Certain foods and beverages, such as coffee, chocolate, onions, and peppermint. What increases the risk? You are more likely to develop this condition if you:  Have an increased body weight.  Have a connective tissue disorder.  Use NSAID medicines. What are the signs or symptoms? Symptoms of this condition include:  Heartburn.  Difficult or painful swallowing.  The feeling of having a lump in the throat.  Abitter taste in the mouth.  Bad breath.  Having a large amount of saliva.  Having an upset or bloated  stomach.  Belching.  Chest pain. Different conditions can cause chest pain. Make sure you see your health care provider if you experience chest pain.  Shortness of breath or wheezing.  Ongoing (chronic) cough or a night-time cough.  Wearing away of tooth enamel.  Weight loss. How is this diagnosed? Your health care provider will take a medical history and perform a physical exam. To determine if you have mild or severe GERD, your health care provider may also monitor how you respond to treatment. You may also have tests, including:  A test to examine your stomach and esophagus with a small camera (endoscopy).  A test thatmeasures the acidity level in your esophagus.  A test thatmeasures how much pressure is on your esophagus.  A barium swallow or modified barium swallow test to show the shape, size, and functioning of your esophagus. How is this treated? The goal of treatment is to help relieve your symptoms and to prevent complications. Treatment for this condition may vary depending on how severe your symptoms are. Your health care provider may recommend:  Changes to your diet.  Medicine.  Surgery. Follow these instructions at home: Eating and drinking   Follow a diet as recommended by your health care provider. This may involve avoiding foods and drinks such as: ? Coffee and tea (with or without caffeine). ? Drinks that containalcohol. ? Energy drinks and sports drinks. ? Carbonated drinks or sodas. ? Chocolate and cocoa. ? Peppermint and mint flavorings. ? Garlic and onions. ? Horseradish. ? Spicy and acidic foods, including peppers, chili powder, curry powder, vinegar, hot sauces, and barbecue sauce. ? Citrus fruit juices and citrus   fruits, such as oranges, lemons, and limes. ? Tomato-based foods, such as red sauce, chili, salsa, and pizza with red sauce. ? Fried and fatty foods, such as donuts, french fries, potato chips, and high-fat dressings. ? High-fat  meats, such as hot dogs and fatty cuts of red and white meats, such as rib eye steak, sausage, ham, and bacon. ? High-fat dairy items, such as whole milk, butter, and cream cheese.  Eat small, frequent meals instead of large meals.  Avoid drinking large amounts of liquid with your meals.  Avoid eating meals during the 2-3 hours before bedtime.  Avoid lying down right after you eat.  Do not exercise right after you eat. Lifestyle   Do not use any products that contain nicotine or tobacco, such as cigarettes, e-cigarettes, and chewing tobacco. If you need help quitting, ask your health care provider.  Try to reduce your stress by using methods such as yoga or meditation. If you need help reducing stress, ask your health care provider.  If you are overweight, reduce your weight to an amount that is healthy for you. Ask your health care provider for guidance about a safe weight loss goal. General instructions  Pay attention to any changes in your symptoms.  Take over-the-counter and prescription medicines only as told by your health care provider. Do not take aspirin, ibuprofen, or other NSAIDs unless your health care provider told you to do so.  Wear loose-fitting clothing. Do not wear anything tight around your waist that causes pressure on your abdomen.  Raise (elevate) the head of your bed about 6 inches (15 cm).  Avoid bending over if this makes your symptoms worse.  Keep all follow-up visits as told by your health care provider. This is important. Contact a health care provider if:  You have: ? New symptoms. ? Unexplained weight loss. ? Difficulty swallowing or it hurts to swallow. ? Wheezing or a persistent cough. ? A hoarse voice.  Your symptoms do not improve with treatment. Get help right away if you:  Have pain in your arms, neck, jaw, teeth, or back.  Feel sweaty, dizzy, or light-headed.  Have chest pain or shortness of breath.  Vomit and your vomit looks  like blood or coffee grounds.  Faint.  Have stool that is bloody or black.  Cannot swallow, drink, or eat. Summary  Gastroesophageal reflux happens when acid from the stomach flows up into the esophagus. GERD is a disease in which the reflux happens often, causes frequent or severe symptoms, or causes problems such as damage to the esophagus.  Treatment for this condition may vary depending on how severe your symptoms are. Your health care provider may recommend diet and lifestyle changes, medicine, or surgery.  Contact a health care provider if you have new or worsening symptoms.  Take over-the-counter and prescription medicines only as told by your health care provider. Do not take aspirin, ibuprofen, or other NSAIDs unless your health care provider told you to do so.  Keep all follow-up visits as told by your health care provider. This is important. This information is not intended to replace advice given to you by your health care provider. Make sure you discuss any questions you have with your health care provider. Document Revised: 01/12/2018 Document Reviewed: 01/12/2018 Elsevier Patient Education  2020 [place] for Gastroesophageal Reflux Disease, Adult When you have gastroesophageal reflux disease (GERD), the foods you eat and your eating habits are very important. Choosing the right foods  can help ease the discomfort of GERD. Consider working with a diet and nutrition specialist (dietitian) to help you make healthy food choices. What general guidelines should I follow?  Eating plan  Choose healthy foods low in fat, such as fruits, vegetables, whole grains, low-fat dairy products, and lean meat, fish, and poultry.  Eat frequent, small meals instead of three large meals each day. Eat your meals slowly, in a relaxed setting. Avoid bending over or lying down until 2-3 hours after eating.  Limit high-fat foods such as fatty meats or fried foods.  Limit your  intake of oils, butter, and shortening to less than 8 teaspoons each day.  Avoid the following: ? Foods that cause symptoms. These may be different for different people. Keep a food diary to keep track of foods that cause symptoms. ? Alcohol. ? Drinking large amounts of liquid with meals. ? Eating meals during the 2-3 hours before bed.  Cook foods using methods other than frying. This may include baking, grilling, or broiling. Lifestyle  Maintain a healthy weight. Ask your health care provider what weight is healthy for you. If you need to lose weight, work with your health care provider to do so safely.  Exercise for at least 30 minutes on 5 or more days each week, or as told by your health care provider.  Avoid wearing clothes that fit tightly around your waist and chest.  Do not use any products that contain nicotine or tobacco, such as cigarettes and e-cigarettes. If you need help quitting, ask your health care provider.  Sleep with the head of your bed raised. Use a wedge under the mattress or blocks under the bed frame to raise the head of the bed. What foods are not recommended? The items listed may not be a complete list. Talk with your dietitian about what dietary choices are best for you. Grains Pastries or quick breads with added fat. [place] toast. Vegetables Deep fried vegetables. [place] fries. Any vegetables prepared with added fat. Any vegetables that cause symptoms. For some people this may include tomatoes and tomato products, chili peppers, onions and garlic, and horseradish. Fruits Any fruits prepared with added fat. Any fruits that cause symptoms. For some people this may include citrus fruits, such as oranges, grapefruit, pineapple, and lemons. Meats and other protein foods High-fat meats, such as fatty beef or pork, hot dogs, ribs, ham, sausage, salami and bacon. Fried meat or protein, including fried fish and fried chicken. Nuts and nut butters. Dairy Whole milk  and chocolate milk. Sour cream. Cream. Ice cream. Cream cheese. Milk shakes. Beverages Coffee and tea, with or without caffeine. Carbonated beverages. Sodas. Energy drinks. Fruit juice made with acidic fruits (such as orange or grapefruit). Tomato juice. Alcoholic drinks. Fats and oils Butter. Margarine. Shortening. Ghee. Sweets and desserts Chocolate and cocoa. Donuts. Seasoning and other foods Pepper. Peppermint and spearmint. Any condiments, herbs, or seasonings that cause symptoms. For some people, this may include curry, hot sauce, or vinegar-based salad dressings. Summary  When you have gastroesophageal reflux disease (GERD), food and lifestyle choices are very important to help ease the discomfort of GERD.  Eat frequent, small meals instead of three large meals each day. Eat your meals slowly, in a relaxed setting. Avoid bending over or lying down until 2-3 hours after eating.  Limit high-fat foods such as fatty meat or fried foods. This information is not intended to replace advice given to you by your health care provider. Make sure you discuss  any questions you have with your health care provider. Document Revised: 10/27/2018 Document Reviewed: 07/07/2016 Elsevier Patient Education  [place] injection What is this medicine? EVOLOCUMAB (e voe LOK ue mab) is known as a PCSK9 inhibitor. It is used to lower the level of cholesterol in the blood. It may be used alone or in combination with other cholesterol-lowering drugs. This drug may also be used to reduce the risk of heart attack, stroke, and certain types of heart surgery in patients with heart disease. This medicine may be used for other purposes; ask your health care provider or pharmacist if you have questions. COMMON BRAND NAME(S): Repatha What should I tell my health care provider before I take this medicine? They need to know if you have any of these conditions:  an unusual or allergic reaction to  evolocumab, other medicines, latex, foods, dyes, or preservatives  pregnant or trying to get pregnant  breast-feeding How should I use this medicine? This medicine is for injection under the skin. You will be taught how to prepare and give this medicine. Use exactly as directed. Take your medicine at regular intervals. Do not take your medicine more often than directed. It is important that you put your used needles and syringes in a special sharps container. Do not put them in a trash can. If you do not have a sharps container, call your pharmacist or health care provider to get one. Talk to your pediatrician regarding the use of this medicine in children. While this drug may be prescribed for children as young as 13 years for selected conditions, precautions do apply. Overdosage: If you think you have taken too much of this medicine contact a poison control center or emergency room at once. NOTE: This medicine is only for you. Do not share this medicine with others. What if I miss a dose? If you miss a dose, take it as soon as you can if there are more than 7 days until the next scheduled dose, or skip the missed dose and take the next dose according to your original schedule. Do not take double or extra doses. What may interact with this medicine? Interactions are not expected. This list may not describe all possible interactions. Give your health care provider a list of all the medicines, herbs, non-prescription drugs, or dietary supplements you use. Also tell them if you smoke, drink alcohol, or use illegal drugs. Some items may interact with your medicine. What should I watch for while using this medicine? Visit your health care provider for regular checks on your progress. Tell your health care provider if your symptoms do not start to get better or if they get worse. You may need blood work done while you are taking this drug. Do not wear the on-body infuser during an MRI. What side  effects may I notice from receiving this medicine? Side effects that you should report to your doctor or health care professional as soon as possible:  allergic reactions like skin rash, itching or hives, swelling of the face, lips, or tongue  signs and symptoms of high blood sugar such as dizziness; dry mouth; dry skin; fruity breath; nausea; stomach pain; increased hunger or thirst; increased urination  signs and symptoms of infection like fever or chills; cough; sore throat; pain or trouble passing urine Side effects that usually do not require medical attention (report to your doctor or health care professional if they continue or are bothersome):  diarrhea  nausea  muscle pain  pain, redness, or irritation at site where injected This list may not describe all possible side effects. Call your doctor for medical advice about side effects. You may report side effects to FDA at 1-800-FDA-1088. Where should I keep my medicine? Keep out of the reach of children. You will be instructed on how to store this medicine. Throw away any unused medicine after the expiration date on the label. NOTE: This sheet is a summary. It may not cover all possible information. If you have questions about this medicine, talk to your doctor, pharmacist, or health care provider.  2020 Elsevier/Gold Standard (2019-05-09 16:22:29)    High Cholesterol  High cholesterol is a condition in which the blood has high levels of a white, waxy, fat-like substance (cholesterol). The human body needs small amounts of cholesterol. The liver makes all the cholesterol that the body needs. Extra (excess) cholesterol comes from the food that we eat. Cholesterol is carried from the liver by the blood through the blood vessels. If you have high cholesterol, deposits (plaques) may build up on the walls of your blood vessels (arteries). Plaques make the arteries narrower and stiffer. Cholesterol plaques increase your risk for heart  attack and stroke. Work with your health care provider to keep your cholesterol levels in a healthy range. What increases the risk? This condition is more likely to develop in people who:  Eat foods that are high in animal fat (saturated fat) or cholesterol.  Are overweight.  Are not getting enough exercise.  Have a family history of high cholesterol. What are the signs or symptoms? There are no symptoms of this condition. How is this diagnosed? This condition may be diagnosed from the results of a blood test.  If you are older than age 28, your health care provider may check your cholesterol every 4-6 years.  You may be checked more often if you already have high cholesterol or other risk factors for heart disease. The blood test for cholesterol measures:  "Bad" cholesterol (LDL cholesterol). This is the main type of cholesterol that causes heart disease. The desired level for LDL is less than 100.  "Good" cholesterol (HDL cholesterol). This type helps to protect against heart disease by cleaning the arteries and carrying the LDL away. The desired level for HDL is 60 or higher.  Triglycerides. These are fats that the body can store or burn for energy. The desired number for triglycerides is lower than 150.  Total cholesterol. This is a measure of the total amount of cholesterol in your blood, including LDL cholesterol, HDL cholesterol, and triglycerides. A healthy number is less than 200. How is this treated? This condition is treated with diet changes, lifestyle changes, and medicines. Diet changes  This may include eating more whole grains, fruits, vegetables, nuts, and fish.  This may also include cutting back on red meat and foods that have a lot of added sugar. Lifestyle changes  Changes may include getting at least 40 minutes of aerobic exercise 3 times a week. Aerobic exercises include walking, biking, and swimming. Aerobic exercise along with a healthy diet can help you  maintain a healthy weight.  Changes may also include quitting smoking. Medicines  Medicines are usually given if diet and lifestyle changes have failed to reduce your cholesterol to healthy levels.  Your health care provider may prescribe a statin medicine. Statin medicines have been shown to reduce cholesterol, which can reduce the risk of heart disease. Follow these instructions at home: Eating  and drinking If told by your health care provider:  Eat chicken (without skin), fish, veal, shellfish, ground [place] breast, and round or loin cuts of red meat.  Do not eat fried foods or fatty meats, such as hot dogs and salami.  Eat plenty of fruits, such as apples.  Eat plenty of vegetables, such as broccoli, potatoes, and carrots.  Eat beans, peas, and lentils.  Eat grains such as barley, rice, couscous, and bulgur wheat.  Eat pasta without cream sauces.  Use skim or nonfat milk, and eat low-fat or nonfat yogurt and cheeses.  Do not eat or drink whole milk, cream, ice cream, egg yolks, or hard cheeses.  Do not eat stick margarine or tub margarines that contain trans fats (also called partially hydrogenated oils).  Do not eat saturated tropical oils, such as coconut oil and palm oil.  Do not eat cakes, cookies, crackers, or other baked goods that contain trans fats.  General instructions  Exercise as directed by your health care provider. Increase your activity level with activities such as gardening, walking, and taking the stairs.  Take over-the-counter and prescription medicines only as told by your health care provider.  Do not use any products that contain nicotine or tobacco, such as cigarettes and e-cigarettes. If you need help quitting, ask your health care provider.  Keep all follow-up visits as told by your health care provider. This is important. Contact a health care provider if:  You are struggling to maintain a healthy diet or weight.  You need help to start  on an exercise program.  You need help to stop smoking. Get help right away if:  You have chest pain.  You have trouble breathing. This information is not intended to replace advice given to you by your health care provider. Make sure you discuss any questions you have with your health care provider. Document Revised: 07/09/2017 Document Reviewed: 01/04/2016 Elsevier Patient Education  [place].  Cholesterol Content in Foods Cholesterol is a waxy, fat-like substance that helps to carry fat in the blood. The body needs cholesterol in small amounts, but too much cholesterol can cause damage to the arteries and heart. Most people should eat less than 200 milligrams (mg) of cholesterol a day. Foods with cholesterol  Cholesterol is found in animal-based foods, such as meat, seafood, and dairy. Generally, low-fat dairy and lean meats have less cholesterol than full-fat dairy and fatty meats. The milligrams of cholesterol per serving (mg per serving) of common cholesterol-containing foods are listed below. Meat and other proteins  Egg -- one large whole egg has 186 mg.  Veal shank -- 4 oz has 141 mg.  Lean ground [place] (93% lean) -- 4 oz has 118 mg.  Fat-trimmed lamb loin -- 4 oz has 106 mg.  Lean ground beef (90% lean) -- 4 oz has 100 mg.  Lobster -- 3.5 oz has 90 mg.  Pork loin chops -- 4 oz has 86 mg.  Canned salmon -- 3.5 oz has 83 mg.  Fat-trimmed beef top loin -- 4 oz has 78 mg.  Frankfurter -- 1 frank (3.5 oz) has 77 mg.  Crab -- 3.5 oz has 71 mg.  Roasted chicken without skin, white meat -- 4 oz has 66 mg.  Light bologna -- 2 oz has 45 mg.  Deli-cut [place] -- 2 oz has 31 mg.  Canned tuna -- 3.5 oz has 31 mg.  Berniece Salines -- 1 oz has 29 mg.  Oysters and mussels (raw) --  3.5 oz has 25 mg.  Mackerel -- 1 oz has 22 mg.  Trout -- 1 oz has 20 mg.  Pork sausage -- 1 link (1 oz) has 17 mg.  Salmon -- 1 oz has 16 mg.  Tilapia -- 1 oz has 14  mg. Dairy  Soft-serve ice cream --  cup (4 oz) has 103 mg.  Whole-milk yogurt -- 1 cup (8 oz) has 29 mg.  Cheddar cheese -- 1 oz has 28 mg.  American cheese -- 1 oz has 28 mg.  Whole milk -- 1 cup (8 oz) has 23 mg.  2% milk -- 1 cup (8 oz) has 18 mg.  Cream cheese -- 1 tablespoon (Tbsp) has 15 mg.  Cottage cheese --  cup (4 oz) has 14 mg.  Low-fat (1%) milk -- 1 cup (8 oz) has 10 mg.  Sour cream -- 1 Tbsp has 8.5 mg.  Low-fat yogurt -- 1 cup (8 oz) has 8 mg.  Nonfat Greek yogurt -- 1 cup (8 oz) has 7 mg.  Half-and-half cream -- 1 Tbsp has 5 mg. Fats and oils  Cod liver oil -- 1 tablespoon (Tbsp) has 82 mg.  Butter -- 1 Tbsp has 15 mg.  Lard -- 1 Tbsp has 14 mg.  Bacon grease -- 1 Tbsp has 14 mg.  Mayonnaise -- 1 Tbsp has 5-10 mg.  Margarine -- 1 Tbsp has 3-10 mg. Exact amounts of cholesterol in these foods may vary depending on specific ingredients and brands. Foods without cholesterol Most plant-based foods do not have cholesterol unless you combine them with a food that has cholesterol. Foods without cholesterol include:  Grains and cereals.  Vegetables.  Fruits.  Vegetable oils, such as olive, canola, and sunflower oil.  Legumes, such as peas, beans, and lentils.  Nuts and seeds.  Egg whites. Summary  The body needs cholesterol in small amounts, but too much cholesterol can cause damage to the arteries and heart.  Most people should eat less than 200 milligrams (mg) of cholesterol a day. This information is not intended to replace advice given to you by your health care provider. Make sure you discuss any questions you have with your health care provider. Document Revised: 06/18/2017 Document Reviewed: 03/02/2017 Elsevier Patient Education  [place].

## 2020-01-29 ENCOUNTER — Other Ambulatory Visit: Payer: Self-pay | Admitting: Internal Medicine

## 2020-01-29 DIAGNOSIS — E785 Hyperlipidemia, unspecified: Secondary | ICD-10-CM

## 2020-01-29 MED ORDER — ATORVASTATIN CALCIUM 40 MG PO TABS: 40.0000 mg | ORAL_TABLET | Freq: Every day | ORAL | 3 refills | Status: DC

## 2020-02-18 NOTE — Progress Notes (Signed)
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  INTERVAL HISTORY: Patient returns to clinic today for routine 40-monthevaluation.  She continues to have mild lymphedema of her left arm but otherwise feels well and is asymptomatic.  She has no neurologic complaints. She denies any recent fevers or illnesses. She has a good appetite and denies weight loss.  She denies any chest pain, shortness of breath, cough, or hemoptysis.  She denies any nausea, vomiting, constipation, or diarrhea. She has no urinary complaints.  Patient offers no further specific complaints today.  REVIEW OF SYSTEMS:   Review of Systems  Constitutional: Negative.  Negative for fever, malaise/fatigue and weight loss.  Respiratory: Negative.  Negative for cough and shortness of breath.   Cardiovascular: Negative.  Negative for chest pain and leg swelling.  Gastrointestinal: Negative.  Negative for abdominal pain and constipation.  Genitourinary: Negative.  Negative for dysuria.  Musculoskeletal: Negative.  Negative for back pain, joint pain and myalgias.  Skin: Negative.  Negative for rash.  Neurological: Negative.  Negative for sensory change, focal weakness, weakness and headaches.  Psychiatric/Behavioral: Negative for depression. The patient is not nervous/anxious.      As per HPI. Otherwise, a complete review of systems is negative.  PAST MEDICAL HISTORY: Past Medical History:  Diagnosis Date  . Anemia   . Breast cancer ([place] 2018   Left  . Breast mass, left 09/22/2016   RECOMMENDATION: Ultrasound-guided core biopsies of masses at left breast 2 o'clock 2 cm from nipple, left breast 2 o'clock 5 cm from nipple, abnormal left axillary lymph nodes.  .Marland KitchenGERD (gastroesophageal reflux disease)   . Hyperglycemia   . Hyperlipidemia   . Obesity   . Personal history of chemotherapy 2018   Left breast  . Personal history of radiation therapy 2018  . Postmenopausal   . Shingles    right eye     PAST SURGICAL HISTORY: Past Surgical History:  Procedure Laterality Date  . ABDOMINAL HYSTERECTOMY    . AXILLARY SENTINEL NODE BIOPSY Left 04/14/2017   Procedure: AXILLARY SENTINEL NODE BIOPSY;  Surgeon: POlean Ree MD;  Location: [facility] ORS;  Service: General;  Laterality: Left;  . BREAST BIOPSY Left 09/30/2016   UKoreabiopsy of 3 areas + chemo  . BREAST EXCISIONAL BIOPSY Left 04/14/2017   Left breast invasive cancer left mastectomy  . ESOPHAGOGASTRODUODENOSCOPY (EGD) WITH PROPOFOL N/A 12/24/2014   Procedure: ESOPHAGOGASTRODUODENOSCOPY (EGD) WITH PROPOFOL;  Surgeon: RManya Silvas MD;  [facility];  Service: Endoscopy;  Laterality: N/A;  . MASTECTOMY Left 04/14/2017   Left breast invasive cancer  . PORTACATH PLACEMENT Right 10/27/2016   Procedure: INSERTION PORT-A-CATH;  Surgeon: TNestor Lewandowsky MD;  Location: [facility] OR[facility];  Service: General;  Laterality: Right;  . TONSILECTOMY, ADENOIDECTOMY, BILATERAL MYRINGOTOMY AND TUBES    . TONSILLECTOMY    . TOTAL MASTECTOMY Left 04/14/2017   Procedure: TOTAL MASTECTOMY;  Surgeon: POlean Ree MD;  Location: [facility] OR[facility];  Service: General;  Laterality: Left;    FAMILY HISTORY: Family History  Problem Relation Age of Onset  . Stroke Mother   . Stroke Father   . Cancer Neg Hx   . Depression Neg Hx   . Breast  cancer Neg Hx     ADVANCED DIRECTIVES (Y/N):  N  HEALTH MAINTENANCE: Social History   Tobacco Use  . Smoking status: Never Smoker  . Smokeless tobacco: Never Used  . Tobacco comment: smoking cessation materials not required  Vaping Use  . Vaping Use: Never used  Substance Use Topics  . Alcohol use: No    Alcohol/week: 0.0 standard drinks  . Drug use: No     Colonoscopy:  PAP:  Bone density:  Lipid panel:  Allergies  Allergen Reactions  . Latex Itching  . Penicillins Itching and Rash    Has patient had a PCN reaction causing immediate rash, facial/tongue/throat swelling, SOB or lightheadedness with hypotension: Yes Has patient had a PCN reaction causing severe rash involving mucus membranes or skin necrosis: No Has patient had a PCN reaction that required hospitalization No Has patient had a PCN reaction occurring within the last 10 years: No If all of the above answers are "NO", then may proceed with Cephalosporin use.     Current Outpatient Medications  Medication Sig Dispense Refill  . acetaminophen (TYLENOL) 325 MG tablet Take 2 tablets (650 mg total) by mouth every 6 (six) hours as needed. 60 tablet 0  . aspirin 81 MG tablet Take 81 mg by mouth daily.    Marland Kitchen atorvastatin (LIPITOR) 40 MG tablet Take 1 tablet (40 mg total) by mouth daily. At night 90 tablet 3  . Cholecalciferol (VITAMIN D) 2000 units tablet Take 2,000 Units by mouth 3 (three) times a week.     . diclofenac Sodium (VOLTAREN) 1 % GEL Apply 2-4 g topically 4 (four) times daily. 2 grams upper body and 4 grams lower body qid 150 g 11  . ezetimibe (ZETIA) 10 MG tablet Take 1 tablet (10 mg total) by mouth daily. 90 tablet 3  . ferrous sulfate 325 (65 FE) MG EC tablet Take 325 mg by mouth 3 (three) times a week.     . fluticasone (FLONASE) 50 MCG/ACT nasal spray Place 2 sprays into both nostrils daily as needed for allergies or rhinitis. 16 g 11  . levocetirizine (XYZAL) 5 MG tablet Take 0.5 tablets (2.5 mg  total) by mouth every evening. 15 tablet 11  . Multiple Vitamin (MULTIVITAMIN WITH MINERALS) TABS tablet Take 1 tablet by mouth 3 (three) times a week.     . pantoprazole (PROTONIX) 40 MG tablet Take 1 tablet (40 mg total) by mouth daily. 30 minutes before food 90 tablet 3  . Polyethyl Glycol-Propyl Glycol (SYSTANE OP) Place 1 drop into both eyes daily as needed (dry eyes).     . sodium chloride (OCEAN) 0.65 % SOLN nasal spray Place 1 spray into both nostrils as needed for congestion. 60 mL 11   No current facility-administered medications for this visit.    OBJECTIVE: Vitals:   02/22/20 1122  BP: 128/75  Pulse: 70  Resp: 16  Temp: (!) 97 F (36.1 C)  SpO2: 100%     Body mass index is 30.65 kg/m.    ECOG FS:0 - Asymptomatic  General: Well-developed, well-nourished, no acute distress. Eyes: Pink conjunctiva, anicteric sclera. HEENT: Normocephalic, moist mucous membranes. Lungs: No audible wheezing or coughing. Heart: Regular rate and rhythm. Abdomen: Soft, nontender, no  obvious distention. Musculoskeletal: No edema, cyanosis, or clubbing.  Lymphedema of left arm.   Neuro: Alert, answering all questions appropriately. Cranial nerves grossly intact. Skin: No rashes or petechiae noted. Psych: Normal affect.   LAB RESULTS:  Lab Results  Component Value Date   NA 141 01/11/2020   K 4.3 01/11/2020   CL 111 01/11/2020   CO2 25 01/11/2020   GLUCOSE 89 01/11/2020   BUN 23 01/11/2020   CREATININE 1.32 (H) 01/11/2020   CALCIUM 9.5 01/11/2020   PROT 6.9 01/11/2020   ALBUMIN 3.9 01/11/2020   AST 20 01/11/2020   ALT 16 01/11/2020   ALKPHOS 74 01/11/2020   BILITOT 0.4 01/11/2020   GFRNONAA 38 (L) 02/17/2019   GFRAA 44 (L) 02/17/2019    Lab Results  Component Value Date   WBC 5.7 01/11/2020   NEUTROABS 2.7 01/11/2020   HGB 12.1 01/11/2020   HCT 36.6 01/11/2020   MCV 91.9 01/11/2020   PLT 199.0 01/11/2020     STUDIES: No results found.  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  PLAN:    1. 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 She completed neoadjuvant Taxotere, carboplatinum, Herceptin, and Perjeta on February 17, 2017. She had a total mastectomy on April 14, 2017, therefore did not require adjuvant XRT.  She completed her year long maintenance Herceptin on November 03, 2017.  An aromatase inhibitor would not offer benefit given the ER/PR negativity of her disease.  No further intervention is needed.  Patient's most recent right unilateral screening mammogram on September 27, 2019 was reported as BI-RADS 1. Patient also had a CT scan of the chest on September 19, 2018 did not reveal any evidence of disease.  Repeat mammogram in March 2022 and follow-up 1 to 2 days later to discuss the results. 2. Anemia: Resolved.   3. Renal insufficiency: Chronic and unchanged.  Patient's most recent creatinine on January 11, 2020 was reported at 1.32.  Continue follow-up with nephrology as indicated. 4.  Lymphedema: Patient missed her most recent appointment in lymphedema clinic and was given a referral back for further evaluation. 5.  Arthritis: Patient does not complain of this today.  Continue follow-up and treatment per PCP.   Patient expressed understanding and was in agreement with this plan. She also understands that She can call clinic at any time with any questions, concerns, or complaints.   Cancer Staging Malignant neoplasm of upper-outer quadrant of left female breast [facility]) Staging form: Breast, AJCC 8th Edition - Clinical stage from 10/11/2016: Stage IIB (cT2, cN1, cM0, G3, ER: Negative, PR: Negative, HER2: Positive) - Signed by Lloyd Huger, MD on 10/11/2016 - Pathologic stage from 04/23/2017: No Stage  Recommended (ypT0, pN0, cM0, G3, ER: Negative, PR: Negative, HER2: Positive) - Signed by Lloyd Huger, MD on 04/23/2017   Lloyd Huger, MD   02/22/2020 1:26 PM

## 2020-02-22 ENCOUNTER — Encounter: Payer: Self-pay | Admitting: Oncology

## 2020-02-22 ENCOUNTER — Other Ambulatory Visit: Payer: Self-pay

## 2020-02-22 ENCOUNTER — Inpatient Hospital Stay: Payer: Medicare Other | Attending: Oncology | Admitting: Oncology

## 2020-02-22 VITALS — BP 128/75 | HR 70 | Temp 97.0°F | Resp 16 | Wt 173.0 lb

## 2020-02-22 DIAGNOSIS — Z9221 Personal history of antineoplastic chemotherapy: Secondary | ICD-10-CM | POA: Diagnosis not present

## 2020-02-22 DIAGNOSIS — C50412 Malignant neoplasm of upper-outer quadrant of left female breast: Secondary | ICD-10-CM | POA: Insufficient documentation

## 2020-02-22 DIAGNOSIS — N289 Disorder of kidney and ureter, unspecified: Secondary | ICD-10-CM | POA: Diagnosis not present

## 2020-02-22 DIAGNOSIS — Z171 Estrogen receptor negative status [ER-]: Secondary | ICD-10-CM | POA: Diagnosis not present

## 2020-02-22 DIAGNOSIS — Z79899 Other long term (current) drug therapy: Secondary | ICD-10-CM | POA: Diagnosis not present

## 2020-02-22 DIAGNOSIS — M1990 Unspecified osteoarthritis, unspecified site: Secondary | ICD-10-CM | POA: Insufficient documentation

## 2020-02-22 DIAGNOSIS — K219 Gastro-esophageal reflux disease without esophagitis: Secondary | ICD-10-CM | POA: Diagnosis not present

## 2020-02-22 DIAGNOSIS — Z79811 Long term (current) use of aromatase inhibitors: Secondary | ICD-10-CM | POA: Insufficient documentation

## 2020-02-22 DIAGNOSIS — E785 Hyperlipidemia, unspecified: Secondary | ICD-10-CM | POA: Diagnosis not present

## 2020-02-22 DIAGNOSIS — Z923 Personal history of irradiation: Secondary | ICD-10-CM | POA: Insufficient documentation

## 2020-02-22 DIAGNOSIS — Z823 Family history of stroke: Secondary | ICD-10-CM | POA: Insufficient documentation

## 2020-02-22 DIAGNOSIS — I890 Lymphedema, not elsewhere classified: Secondary | ICD-10-CM | POA: Diagnosis not present

## 2020-03-13 ENCOUNTER — Inpatient Hospital Stay: Payer: Medicare Other | Admitting: Occupational Therapy

## 2020-03-13 ENCOUNTER — Other Ambulatory Visit: Payer: Self-pay

## 2020-03-13 DIAGNOSIS — I890 Lymphedema, not elsewhere classified: Secondary | ICD-10-CM

## 2020-03-13 NOTE — Therapy (Signed)
[facility] [address] [place], [place], [place], [address] Phone: [phone]   Fax:  [phone]  Occupational Therapy Screen  Patient Details  Name: Maria Patterson MRN: [id number] Date of Birth: Aug 20, 1940 No data recorded  Encounter Date: 03/13/2020    Past Medical History:  Diagnosis Date  . Anemia   . Breast cancer ([place]) 2018   Left  . Breast mass, left 09/22/2016   RECOMMENDATION: Ultrasound-guided core biopsies of masses at left breast 2 o'clock 2 cm from nipple, left breast 2 o'clock 5 cm from nipple, abnormal left axillary lymph nodes.  Marland Kitchen GERD (gastroesophageal reflux disease)   . Hyperglycemia   . Hyperlipidemia   . Obesity   . Personal history of chemotherapy 2018   Left breast  . Personal history of radiation therapy 2018  . Postmenopausal   . Shingles    right eye     Past Surgical History:  Procedure Laterality Date  . ABDOMINAL HYSTERECTOMY    . AXILLARY SENTINEL NODE BIOPSY Left 04/14/2017   Procedure: AXILLARY SENTINEL NODE BIOPSY;  Surgeon: Olean Ree, MD;  Location: [facility] ORS;  Service: General;  Laterality: Left;  . BREAST BIOPSY Left 09/30/2016   US biopsy of 3 areas + chemo  . BREAST EXCISIONAL BIOPSY Left 04/14/2017   Left breast invasive cancer left mastectomy  . ESOPHAGOGASTRODUODENOSCOPY (EGD) WITH PROPOFOL N/A 12/24/2014   Procedure: ESOPHAGOGASTRODUODENOSCOPY (EGD) WITH PROPOFOL;  Surgeon: Manya Silvas, MD;  Location: [facility];  Service: Endoscopy;  Laterality: N/A;  . MASTECTOMY Left 04/14/2017   Left breast invasive cancer  . PORTACATH PLACEMENT Right 10/27/2016   Procedure: INSERTION PORT-A-CATH;  Surgeon: Nestor Lewandowsky, MD;  Location: [facility] ORS;  Service: General;  Laterality: Right;  . TONSILECTOMY, ADENOIDECTOMY, BILATERAL MYRINGOTOMY AND TUBES    . TONSILLECTOMY    . 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  PLAN:    1. 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 She completed neoadjuvant Taxotere, carboplatinum, Herceptin, and Perjeta on February 17, 2017. She had a total mastectomy on April 14, 2017, therefore did not require adjuvant XRT.  She completed her year long maintenance Herceptin on November 03, 2017.  An aromatase inhibitor would not offer benefit given the ER/PR negativity of her disease.  No further intervention is needed.  Patient's most recent right unilateral screening mammogram on September 27, 2019 was reported as BI-RADS 1. Patient also had a CT scan of the chest on September 19, 2018 did not reveal any evidence of disease.  Repeat mammogram in March 2022 and follow-up 1 to 2 days later to discuss the results. 2. Anemia: Resolved.   3. Renal insufficiency: Chronic and unchanged.  Patient's most recent creatinine on January 11, 2020 was reported at 1.32.  Continue follow-up with nephrology as indicated. 4.  Lymphedema: Patient missed her most recent appointment in lymphedema clinic and was given a referral back for further evaluation. 5.  Arthritis: Patient does not complain of this today.  Continue follow-up and treatment per PCP.    OT SCREEN 03/13/2020: There  were no vitals filed for this visit.   Subjective Assessment - 03/13/20 1144    Subjective  I am doing the pump morning and evening, night sleeve and daysleeve-but the daysleeve is tight at my wrist - I should have seen you probably before now               LYMPHEDEMA/ONCOLOGY QUESTIONNAIRE - 03/13/20 0001      Right Upper Extremity Lymphedema    15 cm Proximal to Olecranon Process 31.8 cm    10 cm Proximal to Olecranon Process 31.3 cm    Olecranon Process 25.5 cm    15 cm Proximal to Ulnar Styloid Process 25.3 cm    10 cm Proximal to Ulnar Styloid Process 21.5 cm    Just Proximal to Ulnar Styloid Process 17 cm    Across Hand at [employer] 20 cm    At [place] of 2nd Digit 6.2 cm    At [facility] of Thumb 6.2 cm      Left Upper Extremity Lymphedema   15 cm Proximal to Olecranon Process 33.2 cm    10 cm Proximal to Olecranon Process 35 cm    Olecranon Process 29 cm    15 cm Proximal to Ulnar Styloid Process 30 cm    10 cm Proximal to Ulnar Styloid Process 26.2 cm    Just Proximal to Ulnar Styloid Process 20 cm    Across Hand at [employer] 19.8 cm    At [place] of 2nd Digit 6.2 cm    At [facility] of Thumb 6.2 cm           Pt was seen the first time for L UE lymphedema March 2020 - after she had cellulitis in R forearm -and seen for CDT and fitted with daytime , night time compression garments and pump. Then she was seen again in Oct 2020 for replacement custom daytime sleeve and glove for L UE lymphedema. Now pt refer to OT again for follow up.  It's been 10 months -and pt daytime compression sleeve - Jobst Elvarex soft sleeve and glove should have been replace at 6 months (about May 21)  That will explain why pt's circumference increased 2.6 cm at wrist, 1.8 cm at forearm, 1.5 cm at elbow and upper arm 0.7 cm  Assess fit of night time garment - fitting well  Pt to wear night time garment as much as she can during day at home- cont pump 2 x day and will reassess next visit if was able to decongest L UE                    OT Long Term Goals - 04/26/19 1029      OT LONG TERM GOAL #1   Title Pt to be independent in wearing of new daytime compression sleeve  and donning it correctly with use of slippy gator device    Baseline pt ed on use of slippy gator -and new sleeve fist well - ed on wearing night time one  correctly    Status Achieved                  Patient will benefit from skilled therapeutic intervention in order to improve the following deficits and impairments:           Visit Diagnosis: Lymphedema of left upper extremity    Problem List Patient Active Problem List   Diagnosis Date Noted  . Tubular adenoma 01/18/2020  .  Osteoporosis 05/12/2019  . Anxiety and depression 05/12/2019  . Herpes 05/12/2019  . Dermatitis 05/12/2019  . Colon polyps 03/17/2019  . Pre-diabetes 02/19/2019  . H/O total mastectomy of left breast 06/03/2018  . Obesity (BMI 30.0-34.9) 03/08/2018  . Insomnia 09/26/2017  . Chronic renal disease, stage III 08/09/2017  . Hypomagnesemia 05/04/2017  . Aortic atherosclerosis ([place]) 05/04/2017  . Compression fracture of first lumbar vertebra ([place]) 05/04/2017  . Degenerative disc disease, lumbar 05/04/2017  . Anemia 02/01/2017  . Atherosclerosis of native arteries of extremity with intermittent claudication ([place]) 11/09/2016  . Allergic rhinitis 11/07/2016  . Malignant neoplasm of upper-outer quadrant of left female breast ([place]) 10/08/2016  . Postmenopausal 09/18/2016  . Ache in joint 03/09/2016  . Gastroesophageal reflux disease 07/01/2012  . Hyperlipidemia LDL goal <100 07/01/2012    Rosalyn Gess  OTR/l,CLT 03/13/2020, 11:47 AM  [facility] [address] [place], [place], [place], [address] Phone: [phone]   Fax:  [phone]  Name: Maria Patterson MRN: [id number] Date of Birth: 26-Jul-1940

## 2020-03-20 ENCOUNTER — Inpatient Hospital Stay: Payer: Medicare Other | Admitting: Occupational Therapy

## 2020-03-27 ENCOUNTER — Ambulatory Visit: Payer: Medicare Other | Admitting: Occupational Therapy

## 2020-03-28 ENCOUNTER — Other Ambulatory Visit: Payer: Self-pay

## 2020-03-28 ENCOUNTER — Ambulatory Visit: Payer: Medicare Other | Attending: Internal Medicine | Admitting: Occupational Therapy

## 2020-03-28 DIAGNOSIS — I972 Postmastectomy lymphedema syndrome: Secondary | ICD-10-CM | POA: Insufficient documentation

## 2020-03-28 NOTE — Therapy (Signed)
[facility] [address], [place], [address] Phone: [phone]   Fax:  [phone]  Occupational Therapy Evaluation  Patient Details  Name: Maria Patterson MRN: [id number] Date of Birth: 19-Feb-1941 No data recorded  Encounter Date: 03/28/2020   OT End of Session - 03/28/20 1101    Visit Number 0           Past Medical History:  Diagnosis Date  . Anemia   . Breast cancer ([place]) 2018   Left  . Breast mass, left 09/22/2016   RECOMMENDATION: Ultrasound-guided core biopsies of masses at left breast 2 o'clock 2 cm from nipple, left breast 2 o'clock 5 cm from nipple, abnormal left axillary lymph nodes.  Marland Kitchen GERD (gastroesophageal reflux disease)   . Hyperglycemia   . Hyperlipidemia   . Obesity   . Personal history of chemotherapy 2018   Left breast  . Personal history of radiation therapy 2018  . Postmenopausal   . Shingles    right eye     Past Surgical History:  Procedure Laterality Date  . ABDOMINAL HYSTERECTOMY    . AXILLARY SENTINEL NODE BIOPSY Left 04/14/2017   Procedure: AXILLARY SENTINEL NODE BIOPSY;  Surgeon: Olean Ree, MD;  Location: [facility] ORS;  Service: General;  Laterality: Left;  . BREAST BIOPSY Left 09/30/2016   US biopsy of 3 areas + chemo  . BREAST EXCISIONAL BIOPSY Left 04/14/2017   Left breast invasive cancer left mastectomy  . ESOPHAGOGASTRODUODENOSCOPY (EGD) WITH PROPOFOL N/A 12/24/2014   Procedure: ESOPHAGOGASTRODUODENOSCOPY (EGD) WITH PROPOFOL;  Surgeon: Manya Silvas, MD;  Location: [facility] ENDOSCOPY;  Service: Endoscopy;  Laterality: N/A;  . MASTECTOMY Left 04/14/2017   Left breast invasive cancer  . PORTACATH PLACEMENT Right 10/27/2016   Procedure: INSERTION PORT-A-CATH;  Surgeon: Nestor Lewandowsky, MD;  Location: [facility] ORS;  Service: General;  Laterality: Right;  . TONSILECTOMY, ADENOIDECTOMY, BILATERAL MYRINGOTOMY AND TUBES    . TONSILLECTOMY    . 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  PLAN:   1. 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 She completed neoadjuvant Taxotere, carboplatinum, Herceptin, and Perjeta on February 17, 2017. She had a total mastectomy on April 14, 2017, therefore did not require adjuvant XRT. She completed her year long maintenance Herceptin on November 03, 2017. An aromatase inhibitor would not offer benefit given the ER/PR negativity of her disease. No further intervention is needed.Patient's most recent right unilateral screening mammogram on September 27, 2019 was reported as BI-RADS 1.Patient also had a CT scan of the chest on September 19, 2018 did not reveal any evidence of disease. Repeat mammogram in March 2022 and follow-up 1 to 2 days later to discuss the results. 2. Anemia: Resolved.  3. Renal insufficiency:Chronic and unchanged. Patient's most recent creatinine on January 11, 2020 was reported at 1.32. Continue follow-up with nephrology as indicated. 4. Lymphedema: Patient missed her most recent appointmentinlymphedema clinic and was given a referral back for further evaluation. 5. Arthritis: Patient does not complain of this today. Continue follow-up and treatment per PCP.  OT SCREEN 03/13/2020: There were no vitals filed for this visit.        Subjective Assessment - 03/13/20 1144         Subjective  I am doing the pump morning and evening, night sleeve and daysleeve-but the daysleeve is tight at my wrist - I should have seen you probably before now                   LYMPHEDEMA/ONCOLOGY QUESTIONNAIRE - 03/13/20 0001              Right Upper Extremity Lymphedema    15 cm Proximal to Olecranon Process 31.8 cm     10 cm Proximal to Olecranon Process 31.3 cm     Olecranon Process 25.5 cm     15 cm Proximal to Ulnar Styloid Process 25.3 cm     10 cm Proximal to Ulnar Styloid Process 21.5 cm     Just Proximal to Ulnar Styloid Process 17 cm     Across Hand at [employer] 20 cm     At [place] of 2nd Digit 6.2 cm     At [facility] of Thumb 6.2 cm          Left Upper Extremity Lymphedema    15 cm Proximal to Olecranon Process 33.2 cm     10 cm Proximal to Olecranon Process 35 cm     Olecranon Process 29 cm     15 cm Proximal to Ulnar Styloid Process 30 cm     10 cm Proximal to Ulnar Styloid Process 26.2 cm     Just Proximal to Ulnar Styloid Process 20 cm     Across Hand at [employer] 19.8 cm     At [facility] of 2nd Digit 6.2 cm     At [facility] of Thumb 6.2 cm           Pt was seen the first time for L UE lymphedema March 2020 - after she had cellulitis in R forearm -and seen for CDT and fitted with daytime , night time compression garments and pump. Then she was seen again in Oct 2020 for replacement custom daytime sleeve and glove for L UE lymphedema. Now pt refer to OT again for follow up.  It's been 10 months -and pt daytime compression sleeve - Jobst Elvarex soft sleeve and glove should have been replace at 6 months (about May 21)  That will explain why pt's circumference increased 2.6 cm at wrist, 1.8 cm at forearm, 1.5 cm at elbow and upper arm 0.7 cm  Assess fit of night time garment - fitting well  Pt to wear night time garment as much as she can during day at home- cont pump 2 x day and will reassess next visit if was able to decongest L UE                NOTE FOR OT SCREEN 03/28/2020:    There were no vitals filed for this visit.   Subjective Assessment - 03/28/20 0859    Subjective  I did like you told  me - wore the big larger sleeve during day -and pump am and pm               LYMPHEDEMA/ONCOLOGY QUESTIONNAIRE - 03/28/20 0001      Left Upper Extremity Lymphedema   15 cm Proximal to Olecranon Process 33.4 cm    10 cm Proximal to Olecranon Process 36 cm  Olecranon Process 29.4 cm    15 cm Proximal to Ulnar Styloid Process 31 cm    10 cm Proximal to Ulnar Styloid Process 26.6 cm    Just Proximal to Ulnar Styloid Process 19.4 cm    Across Hand at [employer] 20 cm    At [place] of 2nd Digit 6.5 cm    At [facility] of Thumb 6.5 cm           Pt arrive with no compression on her L UE  - pt report she did wear her night time compression at much as she can.  DId pump am and pm Pt do show some under arm and thoracic lymphedema  - will monitor next visit - maybe need to recommend pump upgrade to chest piece This date pt measurements increased compare to 2 wks ago - with pt reporting using her night time compression during day too Add this date and ed pt to use 10 cm short stretch bandage over her night time compression sleeve for extra compression - wear it as much as she can during day too  Will reassess next week again if L UE decongested to Sept last year measurements            Patient will benefit from skilled therapeutic intervention in order to improve the following deficits and impairments:           Visit Diagnosis: Postmastectomy lymphedema syndrome    Problem List Patient Active Problem List   Diagnosis Date Noted  . Tubular adenoma 01/18/2020  . Osteoporosis 05/12/2019  . Anxiety and depression 05/12/2019  . Herpes 05/12/2019  . Dermatitis 05/12/2019  . Colon polyps 03/17/2019  . Pre-diabetes 02/19/2019  . H/O total mastectomy of left breast 06/03/2018  . Obesity (BMI 30.0-34.9) 03/08/2018  . Insomnia 09/26/2017  . Chronic renal disease, stage III 08/09/2017  . Hypomagnesemia 05/04/2017  . Aortic atherosclerosis ([place]) 05/04/2017  . Compression  fracture of first lumbar vertebra ([place]) 05/04/2017  . Degenerative disc disease, lumbar 05/04/2017  . Anemia 02/01/2017  . Atherosclerosis of native arteries of extremity with intermittent claudication ([place]) 11/09/2016  . Allergic rhinitis 11/07/2016  . Malignant neoplasm of upper-outer quadrant of left female breast ([place]) 10/08/2016  . Postmenopausal 09/18/2016  . Ache in joint 03/09/2016  . Gastroesophageal reflux disease 07/01/2012  . Hyperlipidemia LDL goal <100 07/01/2012    Rosalyn Gess OTR/l,CLT 03/28/2020, 11:02 AM  [facility] [address], [place], [address] Phone: [phone]   Fax:  [phone]  Name: Maria Patterson MRN: [id number] Date of Birth: 1941/01/20

## 2020-04-03 ENCOUNTER — Ambulatory Visit
Admission: RE | Admit: 2020-04-03 | Discharge: 2020-04-03 | Disposition: A | Discharge status: Home / Self Care | Payer: Medicare Other | Source: Ambulatory Visit | Attending: Radiation Oncology | Admitting: Radiation Oncology

## 2020-04-03 ENCOUNTER — Other Ambulatory Visit: Payer: Self-pay

## 2020-04-03 ENCOUNTER — Encounter: Payer: Self-pay | Admitting: Radiation Oncology

## 2020-04-03 ENCOUNTER — Inpatient Hospital Stay: Payer: Medicare Other | Attending: Oncology | Admitting: Occupational Therapy

## 2020-04-03 VITALS — BP 141/73 | HR 99 | Temp 96.1°F | Wt 172.0 lb

## 2020-04-03 DIAGNOSIS — Z923 Personal history of irradiation: Secondary | ICD-10-CM | POA: Diagnosis not present

## 2020-04-03 DIAGNOSIS — R609 Edema, unspecified: Secondary | ICD-10-CM | POA: Diagnosis not present

## 2020-04-03 DIAGNOSIS — Z853 Personal history of malignant neoplasm of breast: Secondary | ICD-10-CM | POA: Diagnosis not present

## 2020-04-03 DIAGNOSIS — C50412 Malignant neoplasm of upper-outer quadrant of left female breast: Secondary | ICD-10-CM

## 2020-04-03 DIAGNOSIS — I890 Lymphedema, not elsewhere classified: Secondary | ICD-10-CM

## 2020-04-03 NOTE — Therapy (Signed)
[facility] [address] [place], [place], [place], [address] Phone: [phone]   Fax:  [phone]  Occupational Therapy Screen   Patient Details  Name: Maria Patterson MRN: [id number] Date of Birth: 23-Feb-1941 No data recorded  Encounter Date: 04/03/2020   OT End of Session - 04/03/20 0954    Visit Number 0           Past Medical History:  Diagnosis Date  . Anemia   . Breast cancer ([place]) 2018   Left  . Breast mass, left 09/22/2016   RECOMMENDATION: Ultrasound-guided core biopsies of masses at left breast 2 o'clock 2 cm from nipple, left breast 2 o'clock 5 cm from nipple, abnormal left axillary lymph nodes.  Marland Kitchen GERD (gastroesophageal reflux disease)   . Hyperglycemia   . Hyperlipidemia   . Obesity   . Personal history of chemotherapy 2018   Left breast  . Personal history of radiation therapy 2018  . Postmenopausal   . Shingles    right eye     Past Surgical History:  Procedure Laterality Date  . ABDOMINAL HYSTERECTOMY    . AXILLARY SENTINEL NODE BIOPSY Left 04/14/2017   Procedure: AXILLARY SENTINEL NODE BIOPSY;  Surgeon: Olean Ree, MD;  Location: [facility] ORS;  Service: General;  Laterality: Left;  . BREAST BIOPSY Left 09/30/2016   US biopsy of 3 areas + chemo  . BREAST EXCISIONAL BIOPSY Left 04/14/2017   Left breast invasive cancer left mastectomy  . ESOPHAGOGASTRODUODENOSCOPY (EGD) WITH PROPOFOL N/A 12/24/2014   Procedure: ESOPHAGOGASTRODUODENOSCOPY (EGD) WITH PROPOFOL;  Surgeon: Manya Silvas, MD;  Location: [facility] ENDOSCOPY;  Service: Endoscopy;  Laterality: N/A;  . MASTECTOMY Left 04/14/2017   Left breast invasive cancer  . PORTACATH PLACEMENT Right 10/27/2016   Procedure: INSERTION PORT-A-CATH;  Surgeon: Nestor Lewandowsky, MD;  Location: [facility] ORS;  Service: General;  Laterality: Right;  . TONSILECTOMY, ADENOIDECTOMY, BILATERAL MYRINGOTOMY AND TUBES    . TONSILLECTOMY    . TOTAL MASTECTOMY Left 04/14/2017    Procedure: TOTAL MASTECTOMY;  Surgeon: Olean Ree, MD;  Location: [facility] ORS;  Service: General;  Laterality: Left;    There were no vitals filed for this visit.   Subjective Assessment - 04/03/20 0953    Subjective  I did like you told me - and wore it as much as I can during day too- the larger sleeve with bandage - but I feel swollen under my arm more than I use to - and arm still big    Patient Stated Goals Want my arm swelling and infection better    Currently in Pain? No/denies           NOTE FOR OT SCREEN 03/28/2020:    There were no vitals filed for this visit.        Subjective Assessment - 03/28/20 0859         Subjective  I did like you told me - wore the big larger sleeve during day -and pump am and pm            Pt arrive with no compression on her L UE  - pt report she did wear her night time compression at much as she can.  DId pump am and pm Pt do show some under arm and thoracic lymphedema  - will monitor next visit - maybe need to recommend pump upgrade to chest piece This date pt measurements increased compare to 2 wks ago - with pt reporting using her  night time compression during day too Add this date and ed pt to use 10 cm short stretch bandage over her night time compression sleeve for extra compression - wear it as much as she can during day too  Will reassess next week again if L UE decongested to Sept last year measurements      NOW THIS DATE 04/03/2020:  Pt was wearing this last week during day - her Night time compression sleeve with 10 cm short stretch bandage from hand to axilla during day Pt report she is wearing it during day - watching tv      LYMPHEDEMA/ONCOLOGY QUESTIONNAIRE - 04/03/20 0001      Left Upper Extremity Lymphedema   15 cm Proximal to Olecranon Process 33 cm    10 cm Proximal to Olecranon Process 35 cm    Olecranon Process 28.5 cm    15 cm Proximal to Ulnar Styloid Process 29.5 cm    10 cm Proximal to Ulnar  Styloid Process 26.2 cm    Just Proximal to Ulnar Styloid Process 19.2 cm    Across Hand at [employer] 19 cm    At [place] of 2nd Digit 6.1 cm    At [facility] of Thumb 6.1 cm          Pt did decrease some to same as seen in Aug with first screen - but still enlarge 1-2 cm at some areas compare to Oct 2020 Pt done well this past week with bandage over Night time garment during day  Pt to do same  Will contact Rep for upgrade in pump - to one with chest piece  Pt has L upper thoracic lymphedema - that is causing swelling under the arm and in chest - and blocking lymph flow out of L UE  Pt report more full feeling and swelling under arm -will explain her increase in circumference since Oct 2020 - as well as needing new custom compression sleeve for daytime and glove                     OT Long Term Goals - 04/26/19 1029      OT LONG TERM GOAL #1   Title Pt to be independent in wearing of new daytime compression sleeve  and donning it correctly with use of slippy gator device    Baseline pt ed on use of slippy gator -and new sleeve fist well - ed on wearing night time one correctly    Status Achieved                  Patient will benefit from skilled therapeutic intervention in order to improve the following deficits and impairments:           Visit Diagnosis: Lymphedema of left upper extremity    Problem List Patient Active Problem List   Diagnosis Date Noted  . Tubular adenoma 01/18/2020  . Osteoporosis 05/12/2019  . Anxiety and depression 05/12/2019  . Herpes 05/12/2019  . Dermatitis 05/12/2019  . Colon polyps 03/17/2019  . Pre-diabetes 02/19/2019  . H/O total mastectomy of left breast 06/03/2018  . Obesity (BMI 30.0-34.9) 03/08/2018  . Insomnia 09/26/2017  . Chronic renal disease, stage III 08/09/2017  . Hypomagnesemia 05/04/2017  . Aortic atherosclerosis ([place]) 05/04/2017  . Compression fracture of first lumbar vertebra ([place]) 05/04/2017  .  Degenerative disc disease, lumbar 05/04/2017  . Anemia 02/01/2017  . Atherosclerosis of native arteries of extremity with intermittent claudication ([place]) 11/09/2016  .  Allergic rhinitis 11/07/2016  . Malignant neoplasm of upper-outer quadrant of left female breast ([place]) 10/08/2016  . Postmenopausal 09/18/2016  . Ache in joint 03/09/2016  . Gastroesophageal reflux disease 07/01/2012  . Hyperlipidemia LDL goal <100 07/01/2012    Rosalyn Gess OTR/L,CLT 04/03/2020, 9:55 AM  [facility] [address] [place], [place], [place], [address] Phone: [phone]   Fax:  [phone]  Name: Cornell Gaber MRN: [id number] Date of Birth: Sep 26, 1940

## 2020-04-03 NOTE — Progress Notes (Signed)
Radiation Oncology Follow up Note  Name: Maria Patterson   Date:   04/03/2020 MRN:  [id number] DOB: Aug 24, 1940    This 79 y.o. female presents to the clinic today for 2-1/2-year follow-up status post radiation therapy to her left chest and peripheral emphatic's for stage IIb invasive mammary carcinoma.  REFERRING PROVIDER: McLean-Scocuzza, Olivia Mackie *  HPI: Patient is a 79 year old female now 2 and half years having completed left chest wall and peripheral lymphatic radiation therapy for stage IIb invasive mammary carcinoma ER/PR negative.  She is seen today in routine follow-up is doing well.  She specifically denies any new nodularity in the chest wall cough or bone pain.  She does have lymphedema of her left upper extremity which is being followed by lymphedema clinic.  She is not on antiestrogen therapy..  She had a right mammogram back in March which I have reviewed is negative BI-RADS 1  COMPLICATIONS OF TREATMENT: none  FOLLOW UP COMPLIANCE: keeps appointments   PHYSICAL EXAM:  BP (!) 141/73 (BP Location: Right Arm, Patient Position: Sitting, Cuff Size: Normal)   Pulse 99   Temp (!) 96.1 F (35.6 C) (Tympanic)   Wt 172 lb (78 kg)   BMI 30.47 kg/m  Patient status post left modified radical mastectomy chest wall is clear without evidence of nodularity or mass.  Right breast is free of dominant mass.  No axillary or supraclavicular adenopathy is appreciated she does have lymphedema of her left upper extremity.  She is not wearing a sleeve.  Well-developed well-nourished patient in NAD. HEENT reveals PERLA, EOMI, discs not visualized.  Oral cavity is clear. No oral mucosal lesions are identified. Neck is clear without evidence of cervical or supraclavicular adenopathy. Lungs are clear to A&P. Cardiac examination is essentially unremarkable with regular rate and rhythm without murmur rub or thrill. Abdomen is benign with no organomegaly or masses noted. Motor sensory and DTR levels are equal and  symmetric in the upper and lower extremities. Cranial nerves II through XII are grossly intact. Proprioception is intact. No peripheral adenopathy or edema is identified. No motor or sensory levels are noted. Crude visual fields are within normal range.  RADIOLOGY RESULTS: Mammogram reviewed compatible with above-stated findings  PLAN: Present time she is now 2-1/2 years out with no evidence of disease I am pleased with her overall progress.  She continues monitoring with the lymphedema clinic.  I am pleased with her overall progress have asked to see her back in 1 year for follow-up based on the ER/PR negative nature of her disease she is not on antiestrogen therapy.  Patient knows to call with any concerns.  I would like to take this opportunity to thank you for allowing me to participate in the care of your patient.Noreene Filbert, MD

## 2020-04-04 ENCOUNTER — Ambulatory Visit (INDEPENDENT_AMBULATORY_CARE_PROVIDER_SITE_OTHER): Payer: Medicare Other

## 2020-04-04 ENCOUNTER — Other Ambulatory Visit: Payer: Self-pay

## 2020-04-04 VITALS — Temp 98.4°F

## 2020-04-04 DIAGNOSIS — M810 Age-related osteoporosis without current pathological fracture: Secondary | ICD-10-CM | POA: Diagnosis not present

## 2020-04-04 MED ORDER — DENOSUMAB 60 MG/ML ~~LOC~~ SOSY
60.0000 mg | PREFILLED_SYRINGE | Freq: Once | SUBCUTANEOUS | Status: AC
  Administered 2020-04-04: 60 mg via SUBCUTANEOUS

## 2020-04-04 NOTE — Progress Notes (Signed)
Patient came in for Prolia injection in right arm SQ. Patient tolerated well.

## 2020-04-09 ENCOUNTER — Other Ambulatory Visit: Payer: Self-pay | Admitting: *Deleted

## 2020-04-09 ENCOUNTER — Ambulatory Visit: Payer: Medicare Other | Admitting: Occupational Therapy

## 2020-04-09 ENCOUNTER — Other Ambulatory Visit: Payer: Self-pay

## 2020-04-09 DIAGNOSIS — I890 Lymphedema, not elsewhere classified: Secondary | ICD-10-CM

## 2020-04-09 DIAGNOSIS — I972 Postmastectomy lymphedema syndrome: Secondary | ICD-10-CM

## 2020-04-09 NOTE — Therapy (Signed)
[place] [address], [place], [address] Phone: [phone]   Fax:  [phone]  Occupational Therapy Screen  Patient Details  Name: Maria Patterson MRN: [id number] Date of Birth: [birth year] No data recorded  Encounter Date: 04/09/2020   OT End of Session - 04/09/20 0954    Visit Number 0           Past Medical History:  Diagnosis Date  . Anemia   . Breast cancer ([place]) 2018   Left  . Breast mass, left 09/22/2016   RECOMMENDATION: Ultrasound-guided core biopsies of masses at left breast 2 o'clock 2 cm from nipple, left breast 2 o'clock 5 cm from nipple, abnormal left axillary lymph nodes.  Marland Kitchen GERD (gastroesophageal reflux disease)   . Hyperglycemia   . Hyperlipidemia   . Obesity   . Personal history of chemotherapy 2018   Left breast  . Personal history of radiation therapy 2018  . Postmenopausal   . Shingles    right eye     Past Surgical History:  Procedure Laterality Date  . ABDOMINAL HYSTERECTOMY    . AXILLARY SENTINEL NODE BIOPSY Left 04/14/2017   Procedure: AXILLARY SENTINEL NODE BIOPSY;  Surgeon: Olean Ree, MD;  Location: [facility] ORS;  Service: General;  Laterality: Left;  . BREAST BIOPSY Left 09/30/2016   US biopsy of 3 areas + chemo  . BREAST EXCISIONAL BIOPSY Left 04/14/2017   Left breast invasive cancer left mastectomy  . ESOPHAGOGASTRODUODENOSCOPY (EGD) WITH PROPOFOL N/A 12/24/2014   Procedure: ESOPHAGOGASTRODUODENOSCOPY (EGD) WITH PROPOFOL;  Surgeon: Manya Silvas, MD;  Location[facility] ENDOSCOPY;  Service: Endoscopy;  Laterality: N/A;  . MASTECTOMY Left 04/14/2017   Left breast invasive cancer  . PORTACATH PLACEMENT Right 10/27/2016   Procedure: INSERTION PORT-A-CATH;  Surgeon: Nestor Lewandowsky, MD;  Location: [facility] ORS;  Service: General;  Laterality: Right;  . TONSILECTOMY, ADENOIDECTOMY, BILATERAL MYRINGOTOMY AND TUBES    . TONSILLECTOMY    . TOTAL MASTECTOMY Left 04/14/2017    Procedure: TOTAL MASTECTOMY;  Surgeon: Olean Ree, MD;  Location: [facility] ORS;  Service: General;  Laterality: Left;    There were no vitals filed for this visit.   Subjective Assessment - 04/09/20 0954    Subjective  I did everything you told me to do - maybe I have just arthritis in my arm - I never took antibiotic for my arm               LYMPHEDEMA/ONCOLOGY QUESTIONNAIRE - 04/09/20 0001      Left Upper Extremity Lymphedema   15 cm Proximal to Olecranon Process 34 cm    10 cm Proximal to Olecranon Process 35.8 cm    Olecranon Process 28.5 cm    15 cm Proximal to Ulnar Styloid Process 30.4 cm    10 cm Proximal to Ulnar Styloid Process 26 cm    Just Proximal to Ulnar Styloid Process 20 cm    Across Hand at [employer] 19 cm    At [place] of 2nd Digit 6.4 cm    At [facility] of Thumb 6.4 cm              Pt arrive with no compression on her L UE - pt report she did do what I told her to do - but she never give details - ? Memory  Also did not remember how her lymphedema and cellulitis started in end of 2019 after explaining to pt - we cannot let  her get infection again -and with every flare up in lymphedema - it is harder to decongest the arm Pt do show some under arm and thoracic lymphedema -recommended pump upgrade - Rep wil contact pt  This date pt measurements increased again -  with pt reporting using her night time compression during day too with bandages  Pt has L upper thoracic lymphedema - that is causing swelling under the arm and in chest - and blocking lymph flow out of L UE  Pt report more full feeling and swelling under arm -will explain her increase in circumference since Oct 2020 - as well as needing new custom compression sleeve for daytime and glove  Bandage her L UE this date :   skin care done with Eucerin lotion  Isotoner glove and stockinet  And then Artiflex 10 cm from wrist <> hand <> wrist  Artiflex 15 cm wrist <> hand to <> elbow  Rosidal foam  from wrist to upper arm  6 cm short stretch wrist <> hand <> wrist 8 cm short stretch wrist , hand to proximal forearm  10 cm short stretch mid forearm to upper arm  Tubigrip F over top of bandages on upper arm    Pt to do AROM for digits , wrist , elbow and shoulder - 3 x day or if bandages tight  Will ask Dr Grayland Ormond order to eval and tx pt - because she is not decongesting  l UE using her existing garments                         OT Long Term Goals - 04/03/19 1023      OT LONG TERM GOAL #1   Title Pt to be independent in wearing of new daytime compression sleeve  and donning it correctly with use of slippy gator device    Baseline Her daysleeve is 58 months old ,and not getting it up all the way - wrist increase compare to March 2 cm - and need sleeve with wider top band    Time 6    Period Weeks    Status New    Target Date 05/15/19                  Patient will benefit from skilled therapeutic intervention in order to improve the following deficits and impairments:           Visit Diagnosis: Postmastectomy lymphedema syndrome    Problem List Patient Active Problem List   Diagnosis Date Noted  . Tubular adenoma 01/18/2020  . Osteoporosis 05/12/2019  . Anxiety and depression 05/12/2019  . Herpes 05/12/2019  . Dermatitis 05/12/2019  . Colon polyps 03/17/2019  . Pre-diabetes 02/19/2019  . H/O total mastectomy of left breast 06/03/2018  . Obesity (BMI 30.0-34.9) 03/08/2018  . Insomnia 09/26/2017  . Chronic renal disease, stage III 08/09/2017  . Hypomagnesemia 05/04/2017  . Aortic atherosclerosis ([place]) 05/04/2017  . Compression fracture of first lumbar vertebra ([place]) 05/04/2017  . Degenerative disc disease, lumbar 05/04/2017  . Anemia 02/01/2017  . Atherosclerosis of native arteries of extremity with intermittent claudication ([place]) 11/09/2016  . Allergic rhinitis 11/07/2016  . Malignant neoplasm of upper-outer quadrant of left  female breast ([place]) 10/08/2016  . Postmenopausal 09/18/2016  . Ache in joint 03/09/2016  . Gastroesophageal reflux disease 07/01/2012  . Hyperlipidemia LDL goal <100 07/01/2012    Rosalyn Gess OTR/L,CLT 04/09/2020, 9:56 AM  [place]  [facility] [address], [place], [address] Phone: [phone]   Fax:  [phone]  Name: Lydiann Bonifas MRN: [id number] Date of Birth: 30-Dec-1940

## 2020-04-09 NOTE — Progress Notes (Signed)
amb  

## 2020-04-10 ENCOUNTER — Ambulatory Visit: Payer: Medicare Other | Admitting: Occupational Therapy

## 2020-04-11 ENCOUNTER — Other Ambulatory Visit: Payer: Self-pay

## 2020-04-11 ENCOUNTER — Ambulatory Visit: Payer: Medicare Other | Admitting: Occupational Therapy

## 2020-04-11 ENCOUNTER — Encounter: Payer: Self-pay | Admitting: Occupational Therapy

## 2020-04-11 DIAGNOSIS — I972 Postmastectomy lymphedema syndrome: Secondary | ICD-10-CM | POA: Diagnosis not present

## 2020-04-11 NOTE — Therapy (Signed)
[place] [facility] [address] [place], [place], [address] Phone: [phone]   Fax:  [phone]  Occupational Therapy Evaluation  Patient Details  Name: Maria Patterson MRN: [id number] Date of Birth: February 04, 1941 Referring Provider (OT): Dr Grayland Ormond   Encounter Date: 04/11/2020   OT End of Session - 04/11/20 1247    Visit Number 1    Number of Visits 9    Date for OT Re-Evaluation 05/03/20    OT Start Time 0900    OT Stop Time 0935    OT Time Calculation (min) 35 min    Activity Tolerance Patient tolerated treatment well    Behavior During Therapy [facility] for tasks assessed/performed           Past Medical History:  Diagnosis Date   Anemia    Breast cancer ([place]) 2018   Left   Breast mass, left 09/22/2016   RECOMMENDATION: Ultrasound-guided core biopsies of masses at left breast 2 o'clock 2 cm from nipple, left breast 2 o'clock 5 cm from nipple, abnormal left axillary lymph nodes.   GERD (gastroesophageal reflux disease)    Hyperglycemia    Hyperlipidemia    Obesity    Personal history of chemotherapy 2018   Left breast   Personal history of radiation therapy 2018   Postmenopausal    Shingles    right eye     Past Surgical History:  Procedure Laterality Date   ABDOMINAL HYSTERECTOMY     AXILLARY SENTINEL NODE BIOPSY Left 04/14/2017   Procedure: AXILLARY SENTINEL NODE BIOPSY;  Surgeon: Olean Ree, MD;  Location: [facility] ORS;  Service: General;  Laterality: Left;   BREAST BIOPSY Left 09/30/2016   US biopsy of 3 areas + chemo   BREAST EXCISIONAL BIOPSY Left 04/14/2017   Left breast invasive cancer left mastectomy   ESOPHAGOGASTRODUODENOSCOPY (EGD) WITH PROPOFOL N/A 12/24/2014   Procedure: ESOPHAGOGASTRODUODENOSCOPY (EGD) WITH PROPOFOL;  Surgeon: Manya Silvas, MD;  Location: [place];  Service: Endoscopy;  Laterality: N/A;   MASTECTOMY Left 04/14/2017   Left breast invasive cancer    PORTACATH PLACEMENT Right 10/27/2016   Procedure: INSERTION PORT-A-CATH;  Surgeon: Nestor Lewandowsky, MD;  Location: [facility] ORS;  Service: General;  Laterality: Right;   TONSILECTOMY, ADENOIDECTOMY, BILATERAL MYRINGOTOMY AND TUBES     TONSILLECTOMY     TOTAL MASTECTOMY Left 04/14/2017   Procedure: TOTAL MASTECTOMY;  Surgeon: Olean Ree, MD;  Location: [facility] ORS;  Service: General;  Laterality: Left;    There were no vitals filed for this visit.   Subjective Assessment - 04/11/20 1014    Subjective  I cannot remember that I used antibiotics 2 yrs ago when this started - my arm is just not doing as it should - I am doing everything you told me    Pertinent History s/p total mastectomy 04/14/2017.  Completed neoadjuvant Taxotere, carbo, Herceptin and Perjeta on 02/17/2017.  Completed 1 year of Herceptin on 11/03/2017.  Would not benefit from aromatase inhibitor.Pt cellulitis started 06/03/18 in R forearm - did 3 rounds of antibiotics - appear last one improved symptoms - DVT Doppler was negative then - she was seen since then 2 x to replace compression - but this time last fitting was Oct 2020 - pt L UE this time increase more than in past - attempt for pt to use her night time garment 24/7 for week or 2 to decrease - but no success- ? pt follow thru / ? memory - pt need to  be bandage and CDT need to be done to get L UE and thoracic lymphedema under control    Patient Stated Goals Want my arm swelling and infection better    Currently in Pain? No/denies             [facility] OT Assessment - 04/11/20 0001      Assessment   Medical Diagnosis L UE and thoracic lymphedema     Referring Provider (OT) Dr Grayland Ormond    Onset Date/Surgical Date 06/03/18    Hand Dominance Right    Prior Therapy --   seen in past 2-3 x since 2019     Precautions   Precaution Comments --   lymphedema      Balance Screen   Has the patient fallen in the past 6 months No      Home  Environment   Lives With Family      Prior  Function   Vocation Retired    Leisure pick up granddaugther and help with her             LYMPHEDEMA/ONCOLOGY QUESTIONNAIRE - 04/11/20 0001      Left Upper Extremity Lymphedema   15 cm Proximal to Olecranon Process 34 cm    10 cm Proximal to Olecranon Process 35 cm    Olecranon Process 29 cm    15 cm Proximal to Ulnar Styloid Process 29.2 cm    10 cm Proximal to Ulnar Styloid Process 25.5 cm    Just Proximal to Ulnar Styloid Process 17.4 cm    Across Hand at [employer] 18.4 cm    At [place] of 2nd Digit 6 cm    At [facility] of Thumb 6 cm         Pt was seen for 2-3 sessions for screens - to see if pt could decongest arm with using her night time garment and pump - but was unable    Pt has L upper thoracic lymphedema - that is causing swelling under the arm and in chest - and blocking lymph flow out of L UE  Pt report more full feeling and swelling under arm -will explain her increase in circumference since Oct 2020 - as well as needing new custom compression sleeve for daytime and glove  Pt arrive with  Bandages on this date- was not all the way up to axilla  Removed and measurements taken   see flowsheet   Bandage her L UE this date :   skin care done with Eucerin lotion  Isotoner glove and stockinet  And then Artiflex 10 cm from wrist <> hand <> wrist  Artiflex 15 cm wrist <> hand to <> elbow  Rosidal foam from wrist to upper arm  6 cm short stretch wrist <> hand <> wrist 8 cm short stretch wrist , hand to proximal forearm  10 cm short stretch mid forearm to upper arm  Tubigrip F over top of bandages on upper arm    Pt to do AROM for digits , wrist , elbow and shoulder - 3 x day or if bandages tight  Per Rep - her insurance will not cover upgrade to chest piece - will assess next time if her night time garment is containing her             OT Education - 04/11/20 1246    Education Details Pt L UE circumference increase compare to seen last time - in 2020 -  bandages    Person(s)  Educated Patient    Methods Explanation;Demonstration;Handout    Comprehension Verbalized understanding;Returned demonstration               OT Long Term Goals - 04/11/20 1255      OT LONG TERM GOAL #1   Title Pt to be independent in wearing of new daytime compression sleeve/glove  and donning it correctly with use of slippy gator device    Baseline her daytime sleeve older than 10 months    Time 4    Period Weeks    Status New    Target Date 05/09/20      OT LONG TERM GOAL #2   Title Decrease L UE  measurements  to prior level to decreaes risk for cellulitis and be fit for new daytime compression garments    Baseline L UE circumference increase compare to 10-11 months ago - pt L UE to get decongested to be measure    Time 2    Period Weeks    Status New    Target Date 04/25/20                 Plan - 04/11/20 1247    Clinical Impression Statement Pt was seen the last time in March 2020 after she was treated for L UE lymphedema- pt had end for 2019  cellulitis in L forearm - 3 rounds of antibiotics - pt was fitted with daytime [employer] elvarex sleeve and glove - compression pump to use 76min -60 min 2 x day -and then she still had issues in her forearm measurements - was fitted with night time compresssion sleeve but was not assess for fitting because of COVID then - she Oct 2020  to check up for daytime compression sleeve to be replace  the last time -she was refer for replacement of sleeve - this time her measurements increased  - was longer than 8 months -  pt unable to decrease her circumference with using her night time sleeve and pump - bandage pt  L UE earlier this week when I did screen - would recommend replacement  sleeve with wider band with new glove - and assess if have correct night time compression -or need one with more containment - pt insurance per Rep will not pay for chest piece for pump    OT Occupational Profile and History Problem  Focused Assessment - Including review of records relating to presenting problem    Occupational performance deficits (Please refer to evaluation for details): ADL's    Body Structure / Function / Physical Skills ADL;Edema;UE functional use    Rehab Potential Good    Clinical Decision Making Limited treatment options, no task modification necessary    Comorbidities Affecting Occupational Performance: May have comorbidities impacting occupational performance   chronic condition, ? memory - follow thru with HEP   Modification or Assistance to Complete Evaluation  No modification of tasks or assist necessary to complete eval    OT Frequency 3x / week   then decrease to 1-2 x wk   OT Duration 4 weeks    OT Treatment/Interventions Self-care/ADL training;Manual lymph drainage;Patient/family education;Compression bandaging;Therapeutic exercise;Manual Therapy    Plan bandaging - assess decongesting of UE and get measure for new daytime    OT Home Exercise Plan see pt instruction    Consulted and Agree with Plan of Care Patient           Patient will benefit from skilled therapeutic intervention in order to improve the  following deficits and impairments:   Body Structure / Function / Physical Skills: ADL, Edema, UE functional use       Visit Diagnosis: Postmastectomy lymphedema syndrome - Plan: Ot plan of care cert/re-cert    Problem List Patient Active Problem List   Diagnosis Date Noted   Tubular adenoma 01/18/2020   Osteoporosis 05/12/2019   Anxiety and depression 05/12/2019   Herpes 05/12/2019   Dermatitis 05/12/2019   Colon polyps 03/17/2019   Pre-diabetes 02/19/2019   H/O total mastectomy of left breast 06/03/2018   Obesity (BMI 30.0-34.9) 03/08/2018   Insomnia 09/26/2017   Chronic renal disease, stage III 08/09/2017   Hypomagnesemia 05/04/2017   Aortic atherosclerosis ([place]) 05/04/2017   Compression fracture of first lumbar vertebra ([place]) 05/04/2017    Degenerative disc disease, lumbar 05/04/2017   Anemia 02/01/2017   Atherosclerosis of native arteries of extremity with intermittent claudication ([place]) 11/09/2016   Allergic rhinitis 11/07/2016   Malignant neoplasm of upper-outer quadrant of left female breast ([place]) 10/08/2016   Postmenopausal 09/18/2016   Ache in joint 03/09/2016   Gastroesophageal reflux disease 07/01/2012   Hyperlipidemia LDL goal <100 07/01/2012    Rosalyn Gess  OTR/L,CLT 04/11/2020, 1:59 PM  [place] [facility] [address], [place], [address] Phone: [phone]   Fax:  [phone]  Name: Jenisha Faison MRN: [id number] Date of Birth: 09-Nov-1940

## 2020-04-15 ENCOUNTER — Ambulatory Visit: Payer: Medicare Other | Admitting: Occupational Therapy

## 2020-04-15 ENCOUNTER — Other Ambulatory Visit: Payer: Self-pay

## 2020-04-15 DIAGNOSIS — I972 Postmastectomy lymphedema syndrome: Secondary | ICD-10-CM

## 2020-04-15 NOTE — Therapy (Signed)
[facility] [address] [place], [address] Phone: [phone]   Fax:  [phone]  Occupational Therapy Treatment  Patient Details  Name: Maria Patterson MRN: [id number] Date of Birth: [birth year] Referring Provider (OT): Dr Grayland Ormond   Encounter Date: 04/15/2020   OT End of Session - 04/15/20 1140    Visit Number 2    Number of Visits 9    Date for OT Re-Evaluation 05/03/20    OT Start Time 1055    OT Stop Time 1120    OT Time Calculation (min) 25 min    Activity Tolerance Patient tolerated treatment well    Behavior During Therapy [facility] for tasks assessed/performed           Past Medical History:  Diagnosis Date  . Anemia   . Breast cancer ([place]) 2018   Left  . Breast mass, left 09/22/2016   RECOMMENDATION: Ultrasound-guided core biopsies of masses at left breast 2 o'clock 2 cm from nipple, left breast 2 o'clock 5 cm from nipple, abnormal left axillary lymph nodes.  Marland Kitchen GERD (gastroesophageal reflux disease)   . Hyperglycemia   . Hyperlipidemia   . Obesity   . Personal history of chemotherapy 2018   Left breast  . Personal history of radiation therapy 2018  . Postmenopausal   . Shingles    right eye     Past Surgical History:  Procedure Laterality Date  . ABDOMINAL HYSTERECTOMY    . AXILLARY SENTINEL NODE BIOPSY Left 04/14/2017   Procedure: AXILLARY SENTINEL NODE BIOPSY;  Surgeon: Olean Ree, MD;  Location: [facility] ORS;  Service: General;  Laterality: Left;  . BREAST BIOPSY Left 09/30/2016   US biopsy of 3 areas + chemo  . BREAST EXCISIONAL BIOPSY Left 04/14/2017   Left breast invasive cancer left mastectomy  . ESOPHAGOGASTRODUODENOSCOPY (EGD) WITH PROPOFOL N/A 12/24/2014   Procedure: ESOPHAGOGASTRODUODENOSCOPY (EGD) WITH PROPOFOL;  Surgeon: Manya Silvas, MD;  Location: [facility];  Service: Endoscopy;  Laterality: N/A;  . MASTECTOMY Left 04/14/2017   Left breast invasive cancer  .  PORTACATH PLACEMENT Right 10/27/2016   Procedure: INSERTION PORT-A-CATH;  Surgeon: Nestor Lewandowsky, MD;  Location: [facility] ORS;  Service: General;  Laterality: Right;  . TONSILECTOMY, ADENOIDECTOMY, BILATERAL MYRINGOTOMY AND TUBES    . TONSILLECTOMY    . TOTAL MASTECTOMY Left 04/14/2017   Procedure: TOTAL MASTECTOMY;  Surgeon: Olean Ree, MD;  Location: [facility] ORS;  Service: General;  Laterality: Left;    There were no vitals filed for this visit.   Subjective Assessment - 04/15/20 1138    Subjective  Did okay with the bandages - I hope my arm is down so I can get out of these - I brought my night time and daytime sleeves    Pertinent History s/p total mastectomy 04/14/2017.  Completed neoadjuvant Taxotere, carbo, Herceptin and Perjeta on 02/17/2017.  Completed 1 year of Herceptin on 11/03/2017.  Would not benefit from aromatase inhibitor.Pt cellulitis started 06/03/18 in R forearm - did 3 rounds of antibiotics - appear last one improved symptoms - DVT Doppler was negative then - she was seen since then 2 x to replace compression - but this time last fitting was Oct 2020 - pt L UE this time increase more than in past - attempt for pt to use her night time garment 24/7 for week or 2 to decrease - but no success- ? pt follow thru / ? memory - pt need to be bandage and  CDT need to be done to get L UE and thoracic lymphedema under control    Patient Stated Goals Want my arm swelling better    Currently in Pain? No/denies               LYMPHEDEMA/ONCOLOGY QUESTIONNAIRE - 04/15/20 0001      Left Upper Extremity Lymphedema   15 cm Proximal to Olecranon Process 33 cm    10 cm Proximal to Olecranon Process 35 cm    Olecranon Process 28.5 cm    15 cm Proximal to Ulnar Styloid Process 29.4 cm    10 cm Proximal to Ulnar Styloid Process 25.3 cm    Just Proximal to Ulnar Styloid Process 17 cm    Across Hand at [employer] 18.5 cm    At [place] of 2nd Digit 6 cm    At [facility] of Thumb 6 cm              Pt was seen for 2-3 sessions for screens - to see if pt could decongest arm with using her night time garment and pump - but was unable    Pt has L upper thoracic lymphedema - that is causing swelling under the arm and in chest - and blocking lymph flow out of L UE  Pt report more full feeling and swelling under arm -will explain her increase in circumference since Oct 2020 - as well as needing new custom compression sleeve for daytime and glove  Pt arrive with  Bandages on this date- Removed and measurements taken   see flowsheet  - last time decrease more than this time - but she is now same measurements than Sept 2020 Rep at  [facility] called -and pt can come over to get measure for new daytime sleeve - Jobst Elvarex and Jubilee sleeve with power sleeve  Showed pt that she needs for 3 days - wear her old Jubilee sleeve with 10 cm short stretch bandage most all the time - and use her pump 2 x day - am and pm  And will remeasure her in 3 days - if she is able to maintain her progress    Per Rep - her insurance will not cover upgrade to chest piece -                OT Education - 04/15/20 1139    Education Details review what garments to wear until Thursday am    Person(s) Educated Patient    Methods Explanation;Demonstration;Handout    Comprehension Verbalized understanding;Returned demonstration               OT Long Term Goals - 04/11/20 1255      OT LONG TERM GOAL #1   Title Pt to be independent in wearing of new daytime compression sleeve/glove  and donning it correctly with use of slippy gator device    Baseline her daytime sleeve older than 10 months    Time 4    Period Weeks    Status New    Target Date 05/09/20      OT LONG TERM GOAL #2   Title Decrease L UE  measurements  to prior level to decreaes risk for cellulitis and be fit for new daytime compression garments    Baseline L UE circumference increase compare to 10-11 months ago - pt  L UE to get decongested to be measure    Time 2    Period Weeks    Status New  Target Date 04/25/20                 Plan - 04/15/20 1141    Clinical Impression Statement Pt arrive this date with her bandages on L UE - pt measurements decrease to same as it was Sept 2020 - pt to get measured at [facility] after this appt for new Jobst Elvarex soft sleeve, and new Jubilee night sleeve and powersleeve - and then she needs to wear at home for the next 3 days her night sleeve with 10 cm short stretch bandage over it - will remeasure her in 3 days if she is able to maintain her circumference while she await new compression sleeves - Rep will send prices for [employer] fund assistance    OT Occupational Profile and History Problem Focused Assessment - Including review of records relating to presenting problem    Occupational performance deficits (Please refer to evaluation for details): ADL's    Body Structure / Function / Physical Skills ADL;Edema;UE functional use    Rehab Potential Good    Clinical Decision Making Limited treatment options, no task modification necessary    Comorbidities Affecting Occupational Performance: May have comorbidities impacting occupational performance    Modification or Assistance to Complete Evaluation  No modification of tasks or assist necessary to complete eval    OT Frequency 3x / week    OT Duration 4 weeks    OT Treatment/Interventions Self-care/ADL training;Manual lymph drainage;Patient/family education;Compression bandaging;Therapeutic exercise;Manual Therapy    Plan assess if able to maintain her circumference    OT Home Exercise Plan see pt instruction    Consulted and Agree with Plan of Care Patient           Patient will benefit from skilled therapeutic intervention in order to improve the following deficits and impairments:   Body Structure / Function / Physical Skills: ADL, Edema, UE functional use       Visit Diagnosis: Postmastectomy  lymphedema syndrome    Problem List Patient Active Problem List   Diagnosis Date Noted  . Tubular adenoma 01/18/2020  . Osteoporosis 05/12/2019  . Anxiety and depression 05/12/2019  . Herpes 05/12/2019  . Dermatitis 05/12/2019  . Colon polyps 03/17/2019  . Pre-diabetes 02/19/2019  . H/O total mastectomy of left breast 06/03/2018  . Obesity (BMI 30.0-34.9) 03/08/2018  . Insomnia 09/26/2017  . Chronic renal disease, stage III 08/09/2017  . Hypomagnesemia 05/04/2017  . Aortic atherosclerosis ([place]) 05/04/2017  . Compression fracture of first lumbar vertebra ([place]) 05/04/2017  . Degenerative disc disease, lumbar 05/04/2017  . Anemia 02/01/2017  . Atherosclerosis of native arteries of extremity with intermittent claudication ([place]) 11/09/2016  . Allergic rhinitis 11/07/2016  . Malignant neoplasm of upper-outer quadrant of left female breast ([place]) 10/08/2016  . Postmenopausal 09/18/2016  . Ache in joint 03/09/2016  . Gastroesophageal reflux disease 07/01/2012  . Hyperlipidemia LDL goal <100 07/01/2012    Rosalyn Gess OTR/L,CLT 04/15/2020, 11:46 AM  [facility] [address] [place], [address] Phone: [phone]   Fax:  [phone]  Name: Maria Patterson MRN: [id number] Date of Birth: [birth year]

## 2020-04-19 ENCOUNTER — Ambulatory Visit: Payer: Medicare Other | Attending: Oncology | Admitting: Occupational Therapy

## 2020-04-19 ENCOUNTER — Other Ambulatory Visit: Payer: Self-pay

## 2020-04-19 DIAGNOSIS — I972 Postmastectomy lymphedema syndrome: Secondary | ICD-10-CM | POA: Diagnosis not present

## 2020-04-19 NOTE — Therapy (Signed)
[facility] [address] [place], [address] Phone: [phone]   Fax:  [phone]  Occupational Therapy Treatment  Patient Details  Name: Maria Patterson MRN: [id number] Date of Birth: 1940-10-04 Referring Provider (OT): Dr Grayland Ormond   Encounter Date: 04/19/2020   OT End of Session - 04/19/20 0919    Visit Number 3    Number of Visits 9    Date for OT Re-Evaluation 05/03/20    OT Start Time 0854    OT Stop Time 0909    OT Time Calculation (min) 15 min    Activity Tolerance Patient tolerated treatment well    Behavior During Therapy [facility] for tasks assessed/performed           Past Medical History:  Diagnosis Date  . Anemia   . Breast cancer ([place]) 2018   Left  . Breast mass, left 09/22/2016   RECOMMENDATION: Ultrasound-guided core biopsies of masses at left breast 2 o'clock 2 cm from nipple, left breast 2 o'clock 5 cm from nipple, abnormal left axillary lymph nodes.  Marland Kitchen GERD (gastroesophageal reflux disease)   . Hyperglycemia   . Hyperlipidemia   . Obesity   . Personal history of chemotherapy 2018   Left breast  . Personal history of radiation therapy 2018  . Postmenopausal   . Shingles    right eye     Past Surgical History:  Procedure Laterality Date  . ABDOMINAL HYSTERECTOMY    . AXILLARY SENTINEL NODE BIOPSY Left 04/14/2017   Procedure: AXILLARY SENTINEL NODE BIOPSY;  Surgeon: Olean Ree, MD;  Location: [facility] ORS;  Service: General;  Laterality: Left;  . BREAST BIOPSY Left 09/30/2016   US biopsy of 3 areas + chemo  . BREAST EXCISIONAL BIOPSY Left 04/14/2017   Left breast invasive cancer left mastectomy  . ESOPHAGOGASTRODUODENOSCOPY (EGD) WITH PROPOFOL N/A 12/24/2014   Procedure: ESOPHAGOGASTRODUODENOSCOPY (EGD) WITH PROPOFOL;  Surgeon: Manya Silvas, MD;  Location: [facility] END[facility]COPY;  Service: Endoscopy;  Laterality: N/A;  . MASTECTOMY Left 04/14/2017   Left breast invasive cancer  .  PORTACATH PLACEMENT Right 10/27/2016   Procedure: INSERTION PORT-A-CATH;  Surgeon: Nestor Lewandowsky, MD;  Location: [facility] ORS;  Service: General;  Laterality: Right;  . TONSILECTOMY, ADENOIDECTOMY, BILATERAL MYRINGOTOMY AND TUBES    . TONSILLECTOMY    . TOTAL MASTECTOMY Left 04/14/2017   Procedure: TOTAL MASTECTOMY;  Surgeon: Olean Ree, MD;  Location: [facility] ORS;  Service: General;  Laterality: Left;    There were no vitals filed for this visit.   Subjective Assessment - 04/19/20 0919    Subjective  I did like you told me - wore my black sleeve and pump morning and evening - did pump this am before I come to see you    Pertinent History s/p total mastectomy 04/14/2017.  Completed neoadjuvant Taxotere, carbo, Herceptin and Perjeta on 02/17/2017.  Completed 1 year of Herceptin on 11/03/2017.  Would not benefit from aromatase inhibitor.Pt cellulitis started 06/03/18 in R forearm - did 3 rounds of antibiotics - appear last one improved symptoms - DVT Doppler was negative then - she was seen since then 2 x to replace compression - but this time last fitting was Oct 2020 - pt L UE this time increase more than in past - attempt for pt to use her night time garment 24/7 for week or 2 to decrease - but no success- ? pt follow thru / ? memory - pt need to be bandage and CDT  need to be done to get L UE and thoracic lymphedema under control    Patient Stated Goals Want my arm swelling better    Currently in Pain? No/denies               LYMPHEDEMA/ONCOLOGY QUESTIONNAIRE - 04/19/20 0001      Left Upper Extremity Lymphedema   15 cm Proximal to Olecranon Process 33.5 cm    10 cm Proximal to Olecranon Process 35 cm    Olecranon Process 28.5 cm    15 cm Proximal to Ulnar Styloid Process 29 cm    10 cm Proximal to Ulnar Styloid Process 24.8 cm    Just Proximal to Ulnar Styloid Process 17.2 cm    Across Hand at [employer] 18.3 cm    At [place] of 2nd Digit 6.2 cm    At [facility] of Thumb 6.2 cm           Pt  arrive with her Jobst Elvarex soft sleeve on L UE - she report she is doing what ever I told her to do this past week But when ask can never give me details on what she is doing  Do report she did use lymphedema pump for 45 min to hour - prior to coming in    Pt appear to have this time around L upper thoracic lymphedema - that is causing swelling under the arm and in chest - and blocking lymph flow out of L UE  Pt report more full feeling and swelling under arm -will explain her increase in circumference since Oct 2020 - but she also did not come in to replace daytime sleeve at 7-9 months  Pt  needing new custom compression sleeve for daytime and night sleeve( velcro not sticking)  SHe was measured 5 days ago for new daytime Jobst Elvarex sleeve and  see flowsheet  - last time decrease more than this time - but she is now same measurements than Sept 2020 Rep at  [facility] called -and pt can come over to get measure for new daytime sleeve - Jobst Elvarex and Tribute sleeve   Pt to cont to wear at home for week her old Tribute sleeve with 10 cm short stretch bandage most all the time - and use her pump 2 x day - am and pm  And will remeasure her in week - if she is able to maintain her progress until new compression garments comes in   Per Rep - her insurance will not cover upgrade for chest piece for pump                    OT Long Term Goals - 04/11/20 1255      OT LONG TERM GOAL #1   Title Pt to be independent in wearing of new daytime compression sleeve/glove  and donning it correctly with use of slippy gator device    Baseline her daytime sleeve older than 10 months    Time 4    Period Weeks    Status New    Target Date 05/09/20      OT LONG TERM GOAL #2   Title Decrease L UE  measurements  to prior level to decreaes risk for cellulitis and be fit for new daytime compression garments    Baseline L UE circumference increase compare to 10-11 months ago - pt L  UE to get decongested to be measure    Time 2    Period  Weeks    Status New    Target Date 04/25/20                 Plan - 04/19/20 0920    Clinical Impression Statement Pt  L UE circumference about the same - forearm decrease less than 0.5 cm compare to last week when she was measured for replacement compression sleeves - pt to cont with homeprogram to maintain her L UE circumference until new day time and night time compression arrive    OT Occupational Profile and History Problem Focused Assessment - Including review of records relating to presenting problem    Occupational performance deficits (Please refer to evaluation for details): ADL's    Body Structure / Function / Physical Skills ADL;Edema;UE functional use    Rehab Potential Good    Clinical Decision Making Limited treatment options, no task modification necessary    Comorbidities Affecting Occupational Performance: May have comorbidities impacting occupational performance    Modification or Assistance to Complete Evaluation  No modification of tasks or assist necessary to complete eval    OT Frequency 3x / week    OT Duration 4 weeks    OT Treatment/Interventions Self-care/ADL training;Manual lymph drainage;Patient/family education;Compression bandaging;Therapeutic exercise;Manual Therapy    Plan assess if able to maintain her circumference    OT Home Exercise Plan see pt instruction    Consulted and Agree with Plan of Care Patient           Patient will benefit from skilled therapeutic intervention in order to improve the following deficits and impairments:   Body Structure / Function / Physical Skills: ADL, Edema, UE functional use       Visit Diagnosis: Postmastectomy lymphedema syndrome    Problem List Patient Active Problem List   Diagnosis Date Noted  . Tubular adenoma 01/18/2020  . Osteoporosis 05/12/2019  . Anxiety and depression 05/12/2019  . Herpes 05/12/2019  . Dermatitis 05/12/2019  .  Colon polyps 03/17/2019  . Pre-diabetes 02/19/2019  . H/O total mastectomy of left breast 06/03/2018  . Obesity (BMI 30.0-34.9) 03/08/2018  . Insomnia 09/26/2017  . Chronic renal disease, stage III ([place]) 08/09/2017  . Hypomagnesemia 05/04/2017  . Aortic atherosclerosis ([place]) 05/04/2017  . Compression fracture of first lumbar vertebra ([place]) 05/04/2017  . Degenerative disc disease, lumbar 05/04/2017  . Anemia 02/01/2017  . Atherosclerosis of native arteries of extremity with intermittent claudication ([place]) 11/09/2016  . Allergic rhinitis 11/07/2016  . Malignant neoplasm of upper-outer quadrant of left female breast ([place]) 10/08/2016  . Postmenopausal 09/18/2016  . Ache in joint 03/09/2016  . Gastroesophageal reflux disease 07/01/2012  . Hyperlipidemia LDL goal <100 07/01/2012    Rosalyn Gess OTR/L,CLT 04/19/2020, 9:23 AM  [facility] [address] [place], [address] Phone: [phone]   Fax:  [phone]  Name: Rejoice Heatwole MRN: [id number] Date of Birth: 05-Jan-1941

## 2020-04-26 ENCOUNTER — Encounter: Payer: Medicare Other | Admitting: Occupational Therapy

## 2020-04-29 ENCOUNTER — Other Ambulatory Visit: Payer: Self-pay

## 2020-04-29 ENCOUNTER — Ambulatory Visit: Payer: Medicare Other | Admitting: Occupational Therapy

## 2020-04-29 DIAGNOSIS — I972 Postmastectomy lymphedema syndrome: Secondary | ICD-10-CM

## 2020-04-29 NOTE — Therapy (Signed)
[facility] [address] [place], [address] Phone: [phone]   Fax:  [phone]  Occupational Therapy Treatment  Patient Details  Name: Maria Patterson MRN: [id number] Date of Birth: 08/08/1940 Referring Provider (OT): Dr Grayland Ormond   Encounter Date: 04/29/2020   OT End of Session - 04/29/20 1014    Visit Number 4    Number of Visits 9    Date for OT Re-Evaluation 05/03/20    OT Start Time 0950    OT Stop Time 1011    OT Time Calculation (min) 21 min    Activity Tolerance Patient tolerated treatment well    Behavior During Therapy [facility] for tasks assessed/performed           Past Medical History:  Diagnosis Date  . Anemia   . Breast cancer ([place]) 2018   Left  . Breast mass, left 09/22/2016   RECOMMENDATION: Ultrasound-guided core biopsies of masses at left breast 2 o'clock 2 cm from nipple, left breast 2 o'clock 5 cm from nipple, abnormal left axillary lymph nodes.  Marland Kitchen GERD (gastroesophageal reflux disease)   . Hyperglycemia   . Hyperlipidemia   . Obesity   . Personal history of chemotherapy 2018   Left breast  . Personal history of radiation therapy 2018  . Postmenopausal   . Shingles    right eye     Past Surgical History:  Procedure Laterality Date  . ABDOMINAL HYSTERECTOMY    . AXILLARY SENTINEL NODE BIOPSY Left 04/14/2017   Procedure: AXILLARY SENTINEL NODE BIOPSY;  Surgeon: Olean Ree, MD;  Location: [facility] ORS;  Service: General;  Laterality: Left;  . BREAST BIOPSY Left 09/30/2016   US biopsy of 3 areas + chemo  . BREAST EXCISIONAL BIOPSY Left 04/14/2017   Left breast invasive cancer left mastectomy  . ESOPHAGOGASTRODUODENOSCOPY (EGD) WITH PROPOFOL N/A 12/24/2014   Procedure: ESOPHAGOGASTRODUODENOSCOPY (EGD) WITH PROPOFOL;  Surgeon: Manya Silvas, MD;  Location: [facility] ENDOSCOPY;  Service: Endoscopy;  Laterality: N/A;  . MASTECTOMY Left 04/14/2017   Left breast invasive cancer  .  PORTACATH PLACEMENT Right 10/27/2016   Procedure: INSERTION PORT-A-CATH;  Surgeon: Nestor Lewandowsky, MD;  Location: [facility] ORS;  Service: General;  Laterality: Right;  . TONSILECTOMY, ADENOIDECTOMY, BILATERAL MYRINGOTOMY AND TUBES    . TONSILLECTOMY    . TOTAL MASTECTOMY Left 04/14/2017   Procedure: TOTAL MASTECTOMY;  Surgeon: Olean Ree, MD;  Location: [facility] ORS;  Service: General;  Laterality: Left;    There were no vitals filed for this visit.   Subjective Assessment - 04/29/20 1013    Subjective  I do not wear any compression to church -and I found myself the other night - I took my night sleeve off - Iam going to go pump now    Pertinent History s/p total mastectomy 04/14/2017.  Completed neoadjuvant Taxotere, carbo, Herceptin and Perjeta on 02/17/2017.  Completed 1 year of Herceptin on 11/03/2017.  Would not benefit from aromatase inhibitor.Pt cellulitis started 06/03/18 in R forearm - did 3 rounds of antibiotics - appear last one improved symptoms - DVT Doppler was negative then - she was seen since then 2 x to replace compression - but this time last fitting was Oct 2020 - pt L UE this time increase more than in past - attempt for pt to use her night time garment 24/7 for week or 2 to decrease - but no success- ? pt follow thru / ? memory - pt need to be bandage  and CDT need to be done to get L UE and thoracic lymphedema under control    Patient Stated Goals Want my arm swelling better    Currently in Pain? No/denies               LYMPHEDEMA/ONCOLOGY QUESTIONNAIRE - 04/29/20 0001      Left Upper Extremity Lymphedema   15 cm Proximal to Olecranon Process 34.8 cm    10 cm Proximal to Olecranon Process 35 cm    Olecranon Process 29.3 cm    15 cm Proximal to Ulnar Styloid Process 29 cm    10 cm Proximal to Ulnar Styloid Process 25.8 cm    Just Proximal to Ulnar Styloid Process 19.4 cm    Across Hand at [employer] 19.4 cm    At [place] of 2nd Digit 6.4 cm    At [facility] of Thumb 6.1 cm              Pt arrive with no compression on and ring on her L 2nd digit - able to get it off after trying while Brought  her Job[employer] Elvarex soft sleeve  And glove She took off in her sleep the night sleeve pt report When asking for detail about what she is doing at home , she can never give me details on what she is doing  When she is going home she is going to use lymphedema pump for 45 min to hour    Measurements of L UE circumference taken - wrist increase 2 cm , and hand , forearm and upper arm increase 1 cm - pt report she is doing same than she done the previous time - review again with pt bandaging over night time compression sleeve - 2 x around wrist and thru hand and then circular up from hand to axilla  Pt was measured around 04/19/2020-left message for Rep - when they are expecting Jobst Elvarex and Tribute sleeve replacement will be in  Pt to cont to wear at home for week her old Tribute sleeve with 10 cm short stretch bandage most all the time - and use her pump 2 x day - am and pm  And will remeasure her in week - if she is able to maintain her progressuntil new compression garments comes in   Per Rep - her insurance will not cover upgrade for chest piece for pump                 OT Education - 04/29/20 1014    Education Details review again what compression to do    Person(s) Educated Patient    Methods Explanation;Demonstration;Handout    Comprehension Verbalized understanding;Returned demonstration               OT Long Term Goals - 04/11/20 1255      OT LONG TERM GOAL #1   Title Pt to be independent in wearing of new daytime compression sleeve/glove  and donning it correctly with use of slippy gator device    Baseline her daytime sleeve older than 10 months    Time 4    Period Weeks    Status New    Target Date 05/09/20      OT LONG TERM GOAL #2   Title Decrease L UE  measurements  to prior level to decreaes risk for cellulitis and  be fit for new daytime compression garments    Baseline L UE circumference increase compare to 10-11 months ago -  pt L UE to get decongested to be measure    Time 2    Period Weeks    Status New    Target Date 04/25/20                 Plan - 04/29/20 1015    Clinical Impression Statement Pt's L UE circumference increase again compare to last week at wrist nearly 2 cm and forearm and upper arm 1 cm - review again with pt bandaging over night compression - and to wear that as much as she can -and pump 2 x day - did contact Rep about when replacement compression will be in    OT Occupational Profile and History Problem Focused Assessment - Including review of records relating to presenting problem    Occupational performance deficits (Please refer to evaluation for details): ADL's    Body Structure / Function / Physical Skills ADL;Edema;UE functional use    Rehab Potential Good    Clinical Decision Making Limited treatment options, no task modification necessary    Comorbidities Affecting Occupational Performance: May have comorbidities impacting occupational performance    Modification or Assistance to Complete Evaluation  No modification of tasks or assist necessary to complete eval    OT Frequency 1x / week    OT Duration 4 weeks    OT Treatment/Interventions Self-care/ADL training;Manual lymph drainage;Patient/family education;Compression bandaging;Therapeutic exercise;Manual Therapy    Plan assess if able to maintain her circumference    OT Home Exercise Plan see pt instruction    Consulted and Agree with Plan of Care Patient           Patient will benefit from skilled therapeutic intervention in order to improve the following deficits and impairments:   Body Structure / Function / Physical Skills: ADL, Edema, UE functional use       Visit Diagnosis: Postmastectomy lymphedema syndrome    Problem List Patient Active Problem List   Diagnosis Date Noted  . Tubular  adenoma 01/18/2020  . Osteoporosis 05/12/2019  . Anxiety and depression 05/12/2019  . Herpes 05/12/2019  . Dermatitis 05/12/2019  . Colon polyps 03/17/2019  . Pre-diabetes 02/19/2019  . H/O total mastectomy of left breast 06/03/2018  . Obesity (BMI 30.0-34.9) 03/08/2018  . Insomnia 09/26/2017  . Chronic renal disease, stage III ([place]) 08/09/2017  . Hypomagnesemia 05/04/2017  . Aortic atherosclerosis ([place]) 05/04/2017  . Compression fracture of first lumbar vertebra ([place]) 05/04/2017  . Degenerative disc disease, lumbar 05/04/2017  . Anemia 02/01/2017  . Atherosclerosis of native arteries of extremity with intermittent claudication ([place]) 11/09/2016  . Allergic rhinitis 11/07/2016  . Malignant neoplasm of upper-outer quadrant of left female breast ([place]) 10/08/2016  . Postmenopausal 09/18/2016  . Ache in joint 03/09/2016  . Gastroesophageal reflux disease 07/01/2012  . Hyperlipidemia LDL goal <100 07/01/2012    Rosalyn Gess OTR/L,CLT 04/29/2020, 10:18 AM  [facility] [address], [place], [address] Phone: [phone]   Fax:  [phone]  Name: Maria Patterson MRN: [id number] Date of Birth: [birth year]

## 2020-05-06 ENCOUNTER — Ambulatory Visit: Payer: Medicare Other | Admitting: Occupational Therapy

## 2020-05-06 ENCOUNTER — Other Ambulatory Visit: Payer: Self-pay

## 2020-05-06 DIAGNOSIS — I972 Postmastectomy lymphedema syndrome: Secondary | ICD-10-CM

## 2020-05-06 NOTE — Therapy (Signed)
[place] [facility] [address], [place], [address] Phone: [phone]   Fax:  [phone]  Occupational Therapy Treatment  Patient Details  Name: Maria Patterson MRN: [id number] Date of Birth: [birth year] Referring Provider (OT): Dr Grayland Ormond   Encounter Date: 05/06/2020   OT End of Session - 05/06/20 1007    Visit Number 5    Number of Visits 7    Date for OT Re-Evaluation 06/03/20    OT Start Time 0914    OT Stop Time 0930    OT Time Calculation (min) 16 min    Activity Tolerance Patient tolerated treatment well    Behavior During Therapy [facility] for tasks assessed/performed           Past Medical History:  Diagnosis Date   Anemia    Breast cancer ([place]) 2018   Left   Breast mass, left 09/22/2016   RECOMMENDATION: Ultrasound-guided core biopsies of masses at left breast 2 o'clock 2 cm from nipple, left breast 2 o'clock 5 cm from nipple, abnormal left axillary lymph nodes.   GERD (gastroesophageal reflux disease)    Hyperglycemia    Hyperlipidemia    Obesity    Personal history of chemotherapy 2018   Left breast   Personal history of radiation therapy 2018   Postmenopausal    Shingles    right eye     Past Surgical History:  Procedure Laterality Date   ABDOMINAL HYSTERECTOMY     AXILLARY SENTINEL NODE BIOPSY Left 04/14/2017   Procedure: AXILLARY SENTINEL NODE BIOPSY;  Surgeon: Olean Ree, MD;  Location: [facility] ORS;  Service: General;  Laterality: Left;   BREAST BIOPSY Left 09/30/2016   US biopsy of 3 areas + chemo   BREAST EXCISIONAL BIOPSY Left 04/14/2017   Left breast invasive cancer left mastectomy   ESOPHAGOGASTRODUODENOSCOPY (EGD) WITH PROPOFOL N/A 12/24/2014   Procedure: ESOPHAGOGASTRODUODENOSCOPY (EGD) WITH PROPOFOL;  Surgeon: Manya Silvas, MD;  Location: [facility];  Service: Endoscopy;  Laterality: N/A;   MASTECTOMY Left 04/14/2017   Left breast invasive cancer    PORTACATH PLACEMENT Right 10/27/2016   Procedure: INSERTION PORT-A-CATH;  Surgeon: Nestor Lewandowsky, MD;  Location: [facility] ORS;  Service: General;  Laterality: Right;   TONSILECTOMY, ADENOIDECTOMY, BILATERAL MYRINGOTOMY AND TUBES     TONSILLECTOMY     TOTAL MASTECTOMY Left 04/14/2017   Procedure: TOTAL MASTECTOMY;  Surgeon: Olean Ree, MD;  Location: [facility] ORS;  Service: General;  Laterality: Left;    There were no vitals filed for this visit.   Subjective Assessment - 05/06/20 0849    Subjective  They called me - my new compression are in but I did not pick it up yet- I did use my pump on my arm this morning    Pertinent History s/p total mastectomy 04/14/2017.  Completed neoadjuvant Taxotere, carbo, Herceptin and Perjeta on 02/17/2017.  Completed 1 year of Herceptin on 11/03/2017.  Would not benefit from aromatase inhibitor.Pt cellulitis started 06/03/18 in R forearm - did 3 rounds of antibiotics - appear last one improved symptoms - DVT Doppler was negative then - she was seen since then 2 x to replace compression - but this time last fitting was Oct 2020 - pt L UE this time increase more than in past - attempt for pt to use her night time garment 24/7 for week or 2 to decrease - but no success- ? pt follow thru / ? memory - pt need to be bandage and CDT  need to be done to get L UE and thoracic lymphedema under control    Patient Stated Goals Want my arm swelling better    Currently in Pain? No/denies               LYMPHEDEMA/ONCOLOGY QUESTIONNAIRE - 05/06/20 0001      Left Upper Extremity Lymphedema   15 cm Proximal to Olecranon Process 33 cm    10 cm Proximal to Olecranon Process 34.5 cm    Olecranon Process 28.8 cm    15 cm Proximal to Ulnar Styloid Process 29 cm    10 cm Proximal to Ulnar Styloid Process 25.2 cm    Just Proximal to Ulnar Styloid Process 19 cm    Across Hand at [employer] 19 cm           Pt arrive with her old daytime sleeve in hand - pt sent to [facility] to  pick up her new daytime [employer] sleeve and night time Tribute Pt measurements decrease compare to last time- pt report she did use her pump this am prior to coming in     Pt brought  her new [employer] El[employer]x soft sleeve  in - donn and fitting well - pt to wear that in combination with her old glove - that is still good    Pt also bring in her new Tributesleeve with power sleeve - fitted by this OT and fitting well - did apply velcro markers for pt to use for correct gradient - will reassess measurement of L UE in 10 days -pt to use new sleeves with glove and handpiece at night time  And her pump am and pm for 45 min                OT Education - 05/06/20 1006    Education Details wearing her new compression sleeves - using pump    Person(s) Educated Patient    Methods Explanation;Demonstration;Handout    Comprehension Verbalized understanding;Returned demonstration               OT Long Term Goals - 05/06/20 1009      OT LONG TERM GOAL #1   Title Pt to be independent in wearing of new daytime compression sleeve/glove  and donning it correctly with use of slippy gator device    Baseline Just fitted her today with new daytime compression and night time    Time 2    Period Weeks    Status On-going    Target Date 06/10/20      OT LONG TERM GOAL #2   Title Decrease L UE  measurements  to prior level to decreaes risk for cellulitis and be fit for new daytime compression garments    Baseline see circumference flow sheet- did decrease to 10-11 months prior measurements    Status Achieved                 Plan - 05/06/20 1008    Clinical Impression Statement Pt went to pick up her new replacement sleeve for night time and daytime- fitting very well - pt ed on donning ,doffing and wearing of new compression - in conjuction with her pump - pt to return in about 10 days for remeasurements - to assess if homeprogram in containing her lymphedema in L UE -and  preventing cellulitis    OT Occupational Profile and History Problem Focused Assessment - Including review of records relating to presenting problem    Occupational  performance deficits (Please refer to evaluation for details): ADL's    Body Structure / Function / Physical Skills ADL;Edema;UE functional use    Rehab Potential Good    Clinical Decision Making Limited treatment options, no task modification necessary    Comorbidities Affecting Occupational Performance: May have comorbidities impacting occupational performance    Modification or Assistance to Complete Evaluation  No modification of tasks or assist necessary to complete eval    OT Frequency Biweekly    OT Duration 4 weeks    OT Treatment/Interventions Self-care/ADL training;Manual lymph drainage;Patient/family education;Compression bandaging;Therapeutic exercise;Manual Therapy    Plan assess if able to maintain her circumference with new compression sleeves    OT Home Exercise Plan see pt instruction    Consulted and Agree with Plan of Care Patient           Patient will benefit from skilled therapeutic intervention in order to improve the following deficits and impairments:   Body Structure / Function / Physical Skills: ADL, Edema, UE functional use       Visit Diagnosis: Postmastectomy lymphedema syndrome - Plan: Ot plan of care cert/re-cert    Problem List Patient Active Problem List   Diagnosis Date Noted   Tubular adenoma 01/18/2020   Osteoporosis 05/12/2019   Anxiety and depression 05/12/2019   Herpes 05/12/2019   Dermatitis 05/12/2019   Colon polyps 03/17/2019   Pre-diabetes 02/19/2019   H/O total mastectomy of left breast 06/03/2018   Obesity (BMI 30.0-34.9) 03/08/2018   Insomnia 09/26/2017   Chronic renal disease, stage III ([place]) 08/09/2017   Hypomagnesemia 05/04/2017   Aortic atherosclerosis ([place]) 05/04/2017   Compression fracture of first lumbar vertebra ([place]) 05/04/2017    Degenerative disc disease, lumbar 05/04/2017   Anemia 02/01/2017   Atherosclerosis of native arteries of extremity with intermittent claudication ([place]) 11/09/2016   Allergic rhinitis 11/07/2016   Malignant neoplasm of upper-outer quadrant of left female breast ([place]) 10/08/2016   Postmenopausal 09/18/2016   Ache in joint 03/09/2016   Gastroesophageal reflux disease 07/01/2012   Hyperlipidemia LDL goal <100 07/01/2012    Rosalyn Gess OTR/L,CLT 05/06/2020, 10:13 AM  [facility] [address], [place], [address] Phone: [phone]   Fax:  [phone]  Name: Sultana Tierney MRN: [id number] Date of Birth: 1940-11-02

## 2020-05-17 ENCOUNTER — Ambulatory Visit: Payer: Medicare Other | Admitting: Occupational Therapy

## 2020-05-17 ENCOUNTER — Other Ambulatory Visit: Payer: Self-pay

## 2020-05-17 DIAGNOSIS — I972 Postmastectomy lymphedema syndrome: Secondary | ICD-10-CM

## 2020-05-17 NOTE — Therapy (Signed)
[place] [facility] [address], [place], [address] Phone: [phone]   Fax:  [phone]  Occupational Therapy Treatment  Patient Details  Name: Maria Patterson MRN: [id number] Date of Birth: April 23, 1941 Referring Provider (OT): Dr Grayland Ormond   Encounter Date: 05/17/2020   OT End of Session - 05/17/20 0937    Visit Number 6    Number of Visits 6    Date for OT Re-Evaluation 05/17/20    OT Start Time 0922    OT Stop Time 0937    OT Time Calculation (min) 15 min    Activity Tolerance Patient tolerated treatment well    Behavior During Therapy [facility] for tasks assessed/performed           Past Medical History:  Diagnosis Date  . Anemia   . Breast cancer ([place]) 2018   Left  . Breast mass, left 09/22/2016   RECOMMENDATION: Ultrasound-guided core biopsies of masses at left breast 2 o'clock 2 cm from nipple, left breast 2 o'clock 5 cm from nipple, abnormal left axillary lymph nodes.  Marland Kitchen GERD (gastroesophageal reflux disease)   . Hyperglycemia   . Hyperlipidemia   . Obesity   . Personal history of chemotherapy 2018   Left breast  . Personal history of radiation therapy 2018  . Postmenopausal   . Shingles    right eye     Past Surgical History:  Procedure Laterality Date  . ABDOMINAL HYSTERECTOMY    . AXILLARY SENTINEL NODE BIOPSY Left 04/14/2017   Procedure: AXILLARY SENTINEL NODE BIOPSY;  Surgeon: Olean Ree, MD;  Location: [facility] ORS;  Service: General;  Laterality: Left;  . BREAST BIOPSY Left 09/30/2016   US biopsy of 3 areas + chemo  . BREAST EXCISIONAL BIOPSY Left 04/14/2017   Left breast invasive cancer left mastectomy  . ESOPHAGOGASTRODUODENOSCOPY (EGD) WITH PROPOFOL N/A 12/24/2014   Procedure: ESOPHAGOGASTRODUODENOSCOPY (EGD) WITH PROPOFOL;  Surgeon: Manya Silvas, MD;  Location: [facility] ENDOSCOPY;  Service: Endoscopy;  Laterality: N/A;  . MASTECTOMY Left 04/14/2017   Left breast invasive cancer  .  PORTACATH PLACEMENT Right 10/27/2016   Procedure: INSERTION PORT-A-CATH;  Surgeon: Nestor Lewandowsky, MD;  Location: [facility] ORS;  Service: General;  Laterality: Right;  . TONSILECTOMY, ADENOIDECTOMY, BILATERAL MYRINGOTOMY AND TUBES    . TONSILLECTOMY    . TOTAL MASTECTOMY Left 04/14/2017   Procedure: TOTAL MASTECTOMY;  Surgeon: Olean Ree, MD;  Location: [facility] ORS;  Service: General;  Laterality: Left;    There were no vitals filed for this visit.   Subjective Assessment - 05/17/20 0936    Subjective  I am doing like you told me - this new sleeves fitting okay and night time one too- and doing the pump    Pertinent History s/p total mastectomy 04/14/2017.  Completed neoadjuvant Taxotere, carbo, Herceptin and Perjeta on 02/17/2017.  Completed 1 year of Herceptin on 11/03/2017.  Would not benefit from aromatase inhibitor.Pt cellulitis started 06/03/18 in R forearm - did 3 rounds of antibiotics - appear last one improved symptoms - DVT Doppler was negative then - she was seen since then 2 x to replace compression - but this time last fitting was Oct 2020 - pt L UE this time increase more than in past - attempt for pt to use her night time garment 24/7 for week or 2 to decrease - but no success- ? pt follow thru / ? memory - pt need to be bandage and CDT need to be done to  get L UE and thoracic lymphedema under control    Patient Stated Goals Want my arm swelling better    Currently in Pain? No/denies               LYMPHEDEMA/ONCOLOGY QUESTIONNAIRE - 05/17/20 0001      Left Upper Extremity Lymphedema   15 cm Proximal to Olecranon Process 33 cm    10 cm Proximal to Olecranon Process 35 cm    Olecranon Process 28.2 cm    15 cm Proximal to Ulnar Styloid Process 28.8 cm    10 cm Proximal to Ulnar Styloid Process 25.5 cm    Just Proximal to Ulnar Styloid Process 18.4 cm    Across Hand at [employer] 19.4 cm    At [place] of 2nd Digit 6.3 cm    At [facility] of Thumb 6.3 cm             Pt arrive with  new daytime sleeve on L UE  - not all the way up but report that she can get it up and wear it with her glove- but glove only about 50% or less on during day   Then using new night time Tribute with hand piece Pt able to maintain her measurements well- pt report she did use her pump this am prior to coming in and night time Tribute     New Tributesleeve with power sleeve - fitted by this OT last week - did apply velcro markers for pt to use for correct gradient - will reassess measurement of L UE in 10 days -pt to use new sleeves with glove and handpiece at night time - only circumference that increase was mid upper arm - pt to adjust that strap - pt report she could not get that one tight - need to work on it  And her pump am and pm for 45 min  Pt to contact me in April 2022 about replacing daytime sleeve and glove                OT Education - 05/17/20 0937    Education Details wearing her new compression sleeves - using pump    Person(s) Educated Patient    Methods Explanation;Demonstration;Handout    Comprehension Verbalized understanding;Returned demonstration               OT Long Term Goals - 05/17/20 0943      OT LONG TERM GOAL #1   Title Pt to be independent in wearing of new daytime compression sleeve/glove  and donning it correctly with use of slippy gator device    Baseline fitted last week and able to maintain her L UE lymphedema with new daytime compression and night time    Status Achieved      OT LONG TERM GOAL #2   Title Decrease L UE  measurements  to prior level to decreaes risk for cellulitis and be fit for new daytime compression garments    Status Achieved                 Plan - 05/17/20 0937    Clinical Impression Statement Pt doing well again with new compression sleeve for daytime and night time - for containment of her stage 2 lymphedema in L UE - pt was seen in 2020with her onset of lymphedema with cellulitis episode and then  later the year for replacement sleeves - but this year she skipped May 2021 and did not replace compression -and  had flareup of her L UE lymphedema -and seen now in Oct for treatment to decrease circumference and get fitted with new daytime [employer] Elvarex soft sleeve and glove - as well as new night time compressoin sleeve - pt circumference maintained well the last week - pt discharge need to contact me April 2022 for replacement    OT Occupational Profile and History Problem Focused Assessment - Including review of records relating to presenting problem    Occupational performance deficits (Please refer to evaluation for details): ADL's    Body Structure / Function / Physical Skills ADL;Edema;UE functional use    Rehab Potential Good    Clinical Decision Making Limited treatment options, no task modification necessary    Comorbidities Affecting Occupational Performance: May have comorbidities impacting occupational performance    Modification or Assistance to Complete Evaluation  No modification of tasks or assist necessary to complete eval    OT Frequency Biweekly    OT Duration 4 weeks    OT Treatment/Interventions Self-care/ADL training;Manual lymph drainage;Patient/family education;Compression bandaging;Therapeutic exercise;Manual Therapy    Plan assess if able to maintain her circumference with new compression sleeves    OT Home Exercise Plan see pt instruction    Consulted and Agree with Plan of Care Patient           Patient will benefit from skilled therapeutic intervention in order to improve the following deficits and impairments:   Body Structure / Function / Physical Skills: ADL, Edema, UE functional use       Visit Diagnosis: Postmastectomy lymphedema syndrome    Problem List Patient Active Problem List   Diagnosis Date Noted  . Tubular adenoma 01/18/2020  . Osteoporosis 05/12/2019  . Anxiety and depression 05/12/2019  . Herpes 05/12/2019  . Dermatitis 05/12/2019   . Colon polyps 03/17/2019  . Pre-diabetes 02/19/2019  . H/O total mastectomy of left breast 06/03/2018  . Obesity (BMI 30.0-34.9) 03/08/2018  . Insomnia 09/26/2017  . Chronic renal disease, stage III ([place]) 08/09/2017  . Hypomagnesemia 05/04/2017  . Aortic atherosclerosis ([place]) 05/04/2017  . Compression fracture of first lumbar vertebra ([place]) 05/04/2017  . Degenerative disc disease, lumbar 05/04/2017  . Anemia 02/01/2017  . Atherosclerosis of native arteries of extremity with intermittent claudication ([place]) 11/09/2016  . Allergic rhinitis 11/07/2016  . Malignant neoplasm of upper-outer quadrant of left female breast ([place]) 10/08/2016  . Postmenopausal 09/18/2016  . Ache in joint 03/09/2016  . Gastroesophageal reflux disease 07/01/2012  . Hyperlipidemia LDL goal <100 07/01/2012    Rosalyn Gess OTR/L,CLT 05/17/2020, 9:44 AM  [place] [facility] [address], [place], [address] Phone: [phone]   Fax:  [phone]  Name: Maria Patterson MRN: [id number] Date of Birth: 12/17/1940

## 2020-05-20 ENCOUNTER — Other Ambulatory Visit: Payer: Self-pay

## 2020-05-20 ENCOUNTER — Ambulatory Visit: Payer: Medicare Other | Admitting: Cardiology

## 2020-05-20 ENCOUNTER — Encounter: Payer: Self-pay | Admitting: Cardiology

## 2020-05-20 VITALS — BP 130/78 | HR 75 | Ht 62.0 in | Wt 171.4 lb

## 2020-05-20 DIAGNOSIS — E7800 Pure hypercholesterolemia, unspecified: Secondary | ICD-10-CM

## 2020-05-20 DIAGNOSIS — K2100 Gastro-esophageal reflux disease with esophagitis, without bleeding: Secondary | ICD-10-CM

## 2020-05-20 NOTE — Patient Instructions (Signed)

## 2020-05-20 NOTE — Progress Notes (Signed)
Cardiology Office Note:    Date:  05/20/2020   ID:  Maria Patterson, DOB [birth year], MRN [id number]  PCP:  McLean-Scocuzza, Maria Glow, MD  Cardiologist:  Maria Sable, MD  Electrophysiologist:  None   Referring MD: Maria Patterson *   Chief Complaint  Patient presents with  . OTHER    6 month f/u no complaints today. Meds reviewed verbally with pt.    History of Present Illness:    Maria Patterson is a 79 y.o. female with a hx of obesity, hyperlipidemia, GERD who presents for follow-up.  She is being seen due to hyperlipidemia.  Lipitor was previously increased to 40 mg daily.  Also takes Zetia.  She  Had repeat fasting lipid level 4 months ago.  She states not eating/like eating vegetables.  Likes ice cream and sodas.  She understands her diet is high in cholesterol which likely is causing elevated lipid levels.  She otherwise has no concerns at this time, denies chest pain or shortness of breath.    Past Medical History:  Diagnosis Date  . Anemia   . Breast cancer ([place]) 2018   Left  . Breast mass, left 09/22/2016   RECOMMENDATION: Ultrasound-guided core biopsies of masses at left breast 2 o'clock 2 cm from nipple, left breast 2 o'clock 5 cm from nipple, abnormal left axillary lymph nodes.  Marland Kitchen GERD (gastroesophageal reflux disease)   . Hyperglycemia   . Hyperlipidemia   . Obesity   . Personal history of chemotherapy 2018   Left breast  . Personal history of radiation therapy 2018  . Postmenopausal   . Shingles    right eye     Past Surgical History:  Procedure Laterality Date  . ABDOMINAL HYSTERECTOMY    . AXILLARY SENTINEL NODE BIOPSY Left 04/14/2017   Procedure: AXILLARY SENTINEL NODE BIOPSY;  Surgeon: Maria Ree, MD;  Location: [facility] ORS;  Service: General;  Laterality: Left;  . BREAST BIOPSY Left 09/30/2016   US biopsy of 3 areas + chemo  . BREAST EXCISIONAL BIOPSY Left 04/14/2017   Left breast invasive cancer left mastectomy  . ESOPHAGOGASTRODUODENOSCOPY  (EGD) WITH PROPOFOL N/A 12/24/2014   Procedure: ESOPHAGOGASTRODUODENOSCOPY (EGD) WITH PROPOFOL;  Surgeon: Maria Silvas, MD;  Location: [facility] ENDOSCOPY;  Service: Endoscopy;  Laterality: N/A;  . MASTECTOMY Left 04/14/2017   Left breast invasive cancer  . PORTACATH PLACEMENT Right 10/27/2016   Procedure: INSERTION PORT-A-CATH;  Surgeon: Maria Lewandowsky, MD;  Location: [facility] ORS;  Service: General;  Laterality: Right;  . TONSILECTOMY, ADENOIDECTOMY, BILATERAL MYRINGOTOMY AND TUBES    . TONSILLECTOMY    . TOTAL MASTECTOMY Left 04/14/2017   Procedure: TOTAL MASTECTOMY;  Surgeon: Maria Ree, MD;  Location: [facility] ORS;  Service: General;  Laterality: Left;    Current Medications: Current Meds  Medication Sig  . acetaminophen (TYLENOL) 325 MG tablet Take 2 tablets (650 mg total) by mouth every 6 (six) hours as needed.  Marland Kitchen aspirin 81 MG tablet Take 81 mg by mouth daily.  Marland Kitchen atorvastatin (LIPITOR) 40 MG tablet Take 1 tablet (40 mg total) by mouth daily. At night  . Cholecalciferol (VITAMIN D) 2000 units tablet Take 2,000 Units by mouth 3 (three) times a week.   . diclofenac Sodium (VOLTAREN) 1 % GEL Apply 2-4 g topically 4 (four) times daily. 2 grams upper body and 4 grams lower body qid  . ezetimibe (ZETIA) 10 MG tablet Take 1 tablet (10 mg total) by mouth daily.  . ferrous sulfate 325 (65 FE) MG EC tablet  Take 325 mg by mouth 3 (three) times a week.   . fluticasone (FLONASE) 50 MCG/ACT nasal spray Place 2 sprays into both nostrils daily as needed for allergies or rhinitis.  Marland Kitchen levocetirizine (XYZAL) 5 MG tablet Take 0.5 tablets (2.5 mg total) by mouth every evening.  . Multiple Vitamin (MULTIVITAMIN WITH MINERALS) TABS tablet Take 1 tablet by mouth 3 (three) times a week.   . pantoprazole (PROTONIX) 40 MG tablet Take 1 tablet (40 mg total) by mouth daily. 30 minutes before food  . Polyethyl Glycol-Propyl Glycol (SYSTANE OP) Place 1 drop into both eyes daily as needed (dry eyes).   . sodium chloride  (OCEAN) 0.65 % SOLN nasal spray Place 1 spray into both nostrils as needed for congestion.      Allergies:   Latex and Penicillins   Social History   Socioeconomic History  . Marital status: Widowed    Spouse name: Not on file  . Number of children: 1  . Years of education: GED  . Highest education level: 12th grade  Occupational History  . Occupation: Retired  Tobacco Use  . Smoking status: Never Smoker  . Smokeless tobacco: Never Used  . Tobacco comment: smoking cessation materials not required  Vaping Use  . Vaping Use: Never used  Substance and Sexual Activity  . Alcohol use: No    Alcohol/week: 0.0 standard drinks  . Drug use: No  . Sexual activity: Not Currently  Other Topics Concern  . Not on file  Social History Narrative   Lives at home    1 kid Maria Patterson (daughter) [phone]   Social Determinants of Health   Financial Resource Strain:   . Difficulty of Paying Living Expenses: Not on file  Food Insecurity:   . Worried About Charity fundraiser in the Last Year: Not on file  . Ran Out of Food in the Last Year: Not on file  Transportation Needs:   . Lack of Transportation (Medical): Not on file  . Lack of Transportation (Non-Medical): Not on file  Physical Activity:   . Days of Exercise per Week: Not on file  . Minutes of Exercise per Session: Not on file  Stress:   . Feeling of Stress : Not on file  Social Connections:   . Frequency of Communication with Friends and Family: Not on file  . Frequency of Social Gatherings with Friends and Family: Not on file  . Attends Religious Services: Not on file  . Active Member of Clubs or Organizations: Not on file  . Attends Archivist Meetings: Not on file  . Marital Status: Not on file     Family History: The patient's family history includes Stroke in her father and mother. There is no history of Cancer, Depression, or Breast cancer.  ROS:   Please see the history of present illness.     All  other systems reviewed and are negative.  EKGs/Labs/Other Studies Reviewed:    The following studies were reviewed today:  EKG:  EKG is  ordered today.  The ekg ordered today demonstrates normal sinus rhythm, nonspecific T wave changes.  Recent Labs: 06/27/2019: TSH 2.49 01/11/2020: ALT 16; BUN 23; Creatinine, Ser 1.32; Hemoglobin 12.1; Platelets 199.0; Potassium 4.3; Sodium 141  Recent Lipid Panel    Component Value Date/Time   CHOL 245 (H) 01/11/2020 0802   CHOL 250 (H) 11/17/2019 0918   TRIG 122.0 01/11/2020 0802   HDL 56.10 01/11/2020 0802   HDL 66 11/17/2019  8299   CHOLHDL 4 01/11/2020 0802   VLDL 24.4 01/11/2020 0802   LDLCALC 165 (H) 01/11/2020 0802   LDLCALC 159 (H) 11/17/2019 0918   LDLCALC 197 (H) 02/17/2019 1618    Physical Exam:    VS:  BP 130/78 (BP Location: Left Arm, Patient Position: Sitting, Cuff Size: Normal)   Pulse 75   Ht 5\' 2"  (1.575 m)   Wt 171 lb 6 oz (77.7 kg)   SpO2 99%   BMI 31.34 kg/m     Wt Readings from Last 3 Encounters:  05/20/20 171 lb 6 oz (77.7 kg)  04/03/20 172 lb (78 kg)  02/22/20 173 lb (78.5 kg)     GEN:  Well nourished, well developed in no acute distress HEENT: Normal NECK: No JVD; No carotid bruits LYMPHATICS: No lymphadenopathy CARDIAC: RRR, no murmurs, rubs, gallops RESPIRATORY:  Clear to auscultation without rales, wheezing or rhonchi  ABDOMEN: Soft, non-tender, non-distended MUSCULOSKELETAL:  No edema in lower extremities; No deformity, compression stockings noted in left upper extremity. SKIN: Warm and dry NEUROLOGIC:  Alert and oriented x 3 PSYCHIATRIC:  Normal affect   ASSESSMENT:    1. Pure hypercholesterolemia   2. Gastroesophageal reflux disease with esophagitis without hemorrhage    PLAN:    In order of problems listed above:  1. History of hyperlipidemia, previous 10-year ASCVD elevated.  Due to age, moderate intensity statin advised.  Continue Lipitor to 40 mg daily, Zetia 10 mg daily. last LDL  elevated.  Unsure of the significance of 10-year ASCVD in an 79 year old.  As such we will continue with moderate intensity statin.  Low-cholesterol diet advised.  Patient does not want to increase dose of Lipitor. 2. History of reflux, well controlled.  Continue Protonix 20 mg daily.  Follow-up in 1 year   Medication Adjustments/Labs and Tests Ordered: Current medicines are reviewed at length with the patient today.  Concerns regarding medicines are outlined above.  Orders Placed This Encounter  Procedures  . EKG 12-Lead   No orders of the defined types were placed in this encounter.   Patient Instructions  Medication Instructions:  Your physician recommends that you continue on your current medications as directed. Please refer to the Current Medication list given to you today.  *If you need a refill on your cardiac medications before your next appointment, please call your pharmacy*   Lab Work: None Ordered If you have labs (blood work) drawn today and your tests are completely normal, you will receive your results only by: Marland Kitchen MyChart Message (if you have MyChart) OR . A paper copy in the mail If you have any lab test that is abnormal or we need to change your treatment, we will call you to review the results.   Testing/Procedures: None Ordered   Follow-Up: At [facility], you and your health needs are our priority.  As part of our continuing mission to provide you with exceptional heart care, we have created designated Provider Care Teams.  These Care Teams include your primary Cardiologist (physician) and Advanced Practice Providers (APPs -  Physician Assistants and Nurse Practitioners) who all work together to provide you with the care you need, when you need it.  We recommend signing up for the patient portal called "MyChart".  Sign up information is provided on this After Visit Summary.  MyChart is used to connect with patients for Virtual Visits (Telemedicine).   Patients are able to view lab/test results, encounter notes, upcoming appointments, etc.  Non-urgent messages can  be sent to your provider as well.   To learn more about what you can do with MyChart, go to [email or web address].    Your next appointment:   1 year(s)  The format for your next appointment:   In Person  Provider:   Kate Sable, MD   Other Instructions       Signed, Maria Sable, MD  05/20/2020 12:21 PM    [place]

## 2020-05-31 DIAGNOSIS — K219 Gastro-esophageal reflux disease without esophagitis: Secondary | ICD-10-CM | POA: Diagnosis not present

## 2020-05-31 DIAGNOSIS — Z86010 Personal history of colonic polyps: Secondary | ICD-10-CM | POA: Diagnosis not present

## 2020-05-31 DIAGNOSIS — Z800 Family history of malignant neoplasm of digestive organs: Secondary | ICD-10-CM | POA: Diagnosis not present

## 2020-05-31 DIAGNOSIS — R198 Other specified symptoms and signs involving the digestive system and abdomen: Secondary | ICD-10-CM | POA: Diagnosis not present

## 2020-06-11 ENCOUNTER — Encounter: Payer: Self-pay | Admitting: Internal Medicine

## 2020-06-14 ENCOUNTER — Other Ambulatory Visit
Admission: RE | Admit: 2020-06-14 | Discharge: 2020-06-14 | Disposition: A | Discharge status: Home / Self Care | Payer: Medicare Other | Source: Ambulatory Visit | Attending: Internal Medicine | Admitting: Internal Medicine

## 2020-06-14 ENCOUNTER — Other Ambulatory Visit: Payer: Self-pay

## 2020-06-14 DIAGNOSIS — Z20822 Contact with and (suspected) exposure to covid-19: Secondary | ICD-10-CM | POA: Insufficient documentation

## 2020-06-14 DIAGNOSIS — Z01812 Encounter for preprocedural laboratory examination: Secondary | ICD-10-CM | POA: Insufficient documentation

## 2020-06-15 LAB — SARS CORONAVIRUS 2 (TAT 6-24 HRS): SARS Coronavirus 2: NEGATIVE

## 2020-06-17 ENCOUNTER — Encounter
Admission: RE | Disposition: A | Discharge status: Home / Self Care | Payer: Self-pay | Source: Home / Self Care | Attending: Internal Medicine

## 2020-06-17 ENCOUNTER — Ambulatory Visit: Payer: Medicare Other | Admitting: Certified Registered Nurse Anesthetist

## 2020-06-17 ENCOUNTER — Other Ambulatory Visit: Payer: Self-pay

## 2020-06-17 ENCOUNTER — Ambulatory Visit
Admission: RE | Admit: 2020-06-17 | Discharge: 2020-06-17 | Disposition: A | Discharge status: Home / Self Care | Payer: Medicare Other | Attending: Internal Medicine | Admitting: Internal Medicine

## 2020-06-17 DIAGNOSIS — Z6832 Body mass index (BMI) 32.0-32.9, adult: Secondary | ICD-10-CM | POA: Diagnosis not present

## 2020-06-17 DIAGNOSIS — Z880 Allergy status to penicillin: Secondary | ICD-10-CM | POA: Diagnosis not present

## 2020-06-17 DIAGNOSIS — E669 Obesity, unspecified: Secondary | ICD-10-CM | POA: Diagnosis not present

## 2020-06-17 DIAGNOSIS — Z853 Personal history of malignant neoplasm of breast: Secondary | ICD-10-CM | POA: Insufficient documentation

## 2020-06-17 DIAGNOSIS — Z9221 Personal history of antineoplastic chemotherapy: Secondary | ICD-10-CM | POA: Insufficient documentation

## 2020-06-17 DIAGNOSIS — Z86010 Personal history of colonic polyps: Secondary | ICD-10-CM | POA: Insufficient documentation

## 2020-06-17 DIAGNOSIS — Z7982 Long term (current) use of aspirin: Secondary | ICD-10-CM | POA: Diagnosis not present

## 2020-06-17 DIAGNOSIS — K648 Other hemorrhoids: Secondary | ICD-10-CM | POA: Diagnosis not present

## 2020-06-17 DIAGNOSIS — J449 Chronic obstructive pulmonary disease, unspecified: Secondary | ICD-10-CM | POA: Insufficient documentation

## 2020-06-17 DIAGNOSIS — K219 Gastro-esophageal reflux disease without esophagitis: Secondary | ICD-10-CM | POA: Diagnosis not present

## 2020-06-17 DIAGNOSIS — K641 Second degree hemorrhoids: Secondary | ICD-10-CM | POA: Insufficient documentation

## 2020-06-17 DIAGNOSIS — Z1211 Encounter for screening for malignant neoplasm of colon: Secondary | ICD-10-CM | POA: Insufficient documentation

## 2020-06-17 DIAGNOSIS — Z79899 Other long term (current) drug therapy: Secondary | ICD-10-CM | POA: Insufficient documentation

## 2020-06-17 DIAGNOSIS — D122 Benign neoplasm of ascending colon: Secondary | ICD-10-CM | POA: Insufficient documentation

## 2020-06-17 DIAGNOSIS — Z91040 Latex allergy status: Secondary | ICD-10-CM | POA: Diagnosis not present

## 2020-06-17 DIAGNOSIS — D649 Anemia, unspecified: Secondary | ICD-10-CM | POA: Diagnosis not present

## 2020-06-17 DIAGNOSIS — Z923 Personal history of irradiation: Secondary | ICD-10-CM | POA: Diagnosis not present

## 2020-06-17 DIAGNOSIS — K635 Polyp of colon: Secondary | ICD-10-CM | POA: Diagnosis not present

## 2020-06-17 DIAGNOSIS — Z791 Long term (current) use of non-steroidal anti-inflammatories (NSAID): Secondary | ICD-10-CM | POA: Diagnosis not present

## 2020-06-17 HISTORY — DX: Personal history of adenomatous and serrated colon polyps: Z86.0101

## 2020-06-17 HISTORY — PX: COLONOSCOPY WITH PROPOFOL: SHX5780

## 2020-06-17 HISTORY — DX: Chronic obstructive pulmonary disease, unspecified: J44.9

## 2020-06-17 HISTORY — DX: Personal history of other infectious and parasitic diseases: Z86.19

## 2020-06-17 HISTORY — DX: Depression, unspecified: F32.A

## 2020-06-17 HISTORY — DX: Personal history of colonic polyps: Z86.010

## 2020-06-17 LAB — HM COLONOSCOPY

## 2020-06-17 SURGERY — COLONOSCOPY WITH PROPOFOL
Anesthesia: General

## 2020-06-17 MED ORDER — PROPOFOL 500 MG/50ML IV EMUL
INTRAVENOUS | Status: DC | PRN
  Administered 2020-06-17: 130 ug/kg/min via INTRAVENOUS

## 2020-06-17 MED ORDER — PHENYLEPHRINE HCL (PRESSORS) 10 MG/ML IV SOLN
INTRAVENOUS | Status: DC | PRN
  Administered 2020-06-17: 50 ug via INTRAVENOUS
  Administered 2020-06-17: 100 ug via INTRAVENOUS

## 2020-06-17 MED ORDER — PROPOFOL 500 MG/50ML IV EMUL
INTRAVENOUS | Status: AC
  Filled 2020-06-17: qty 50

## 2020-06-17 MED ORDER — PROPOFOL 10 MG/ML IV BOLUS
INTRAVENOUS | Status: AC
  Filled 2020-06-17: qty 20

## 2020-06-17 MED ORDER — SODIUM CHLORIDE 0.9 % IV SOLN
INTRAVENOUS | Status: DC
  Administered 2020-06-17: 20 mL/h via INTRAVENOUS

## 2020-06-17 MED ORDER — PROPOFOL 10 MG/ML IV BOLUS
INTRAVENOUS | Status: DC | PRN
  Administered 2020-06-17: 20 mg via INTRAVENOUS
  Administered 2020-06-17: 30 mg via INTRAVENOUS

## 2020-06-17 MED ORDER — LIDOCAINE HCL (CARDIAC) PF 100 MG/5ML IV SOSY
PREFILLED_SYRINGE | INTRAVENOUS | Status: DC | PRN
  Administered 2020-06-17: 80 mg via INTRAVENOUS

## 2020-06-17 NOTE — Op Note (Signed)
[facility] Gastroenterology Patient Name: Maria Patterson Procedure Date: 06/17/2020 11:00 AM MRN: [id number] Account #: [id number] Date of Birth: July 21, 1940 Admit Type: Outpatient Age: 79 Room: [facility] ENDO ROOM 2 Gender: Female Note Status: Finalized Procedure:             Colonoscopy Indications:           Surveillance: Personal history of adenomatous polyps                         on last colonoscopy > 5 years ago Providers:             Lorie Apley K. Alice Reichert MD, MD Referring MD:          Nino Glow Mclean-Scocuzza MD, MD (Referring MD) Medicines:             Propofol per Anesthesia Complications:         No immediate complications. Procedure:             Pre-Anesthesia Assessment:                        - The risks and benefits of the procedure and the                         sedation options and risks were discussed with the                         patient. All questions were answered and informed                         consent was obtained.                        - Patient identification and proposed procedure were                         verified prior to the procedure by the nurse. The                         procedure was verified in the procedure room.                        - ASA Grade Assessment: III - A patient with severe                         systemic disease.                        - After reviewing the risks and benefits, the patient                         was deemed in satisfactory condition to undergo the                         procedure.                        After obtaining informed consent, the colonoscope was                         passed under  direct vision. Throughout the procedure,                         the patient's blood pressure, pulse, and oxygen                         saturations were monitored continuously. The                         Colonoscope was introduced through the anus and                         advanced to the the  cecum, identified by appendiceal                         orifice and ileocecal valve. The colonoscopy was                         performed without difficulty. The patient tolerated                         the procedure well. The quality of the bowel                         preparation was good. The ileocecal valve, appendiceal                         orifice, and rectum were photographed. Findings:      The perianal and digital rectal examinations were normal. Pertinent       negatives include normal sphincter tone and no palpable rectal lesions.      Internal hemorrhoids were found during retroflexion. The hemorrhoids       were Grade II (internal hemorrhoids that prolapse but reduce       spontaneously).      A 8 mm polyp was found in the ascending colon. The polyp was sessile.       The polyp was removed with a cold snare. Resection and retrieval were       complete. To prevent bleeding after the polypectomy, two hemostatic       clips were successfully placed (MR conditional). There was no bleeding       during, or at the end, of the procedure.      The exam was otherwise without abnormality. Impression:            - Internal hemorrhoids.                        - One 8 mm polyp in the ascending colon, removed with                         a cold snare. Resected and retrieved. Clips (MR                         conditional) were placed.                        - The examination was otherwise normal. Recommendation:        - Patient has a contact number available for  emergencies. The signs and symptoms of potential                         delayed complications were discussed with the patient.                         Return to normal activities tomorrow. Written                         discharge instructions were provided to the patient.                        - Resume previous diet.                        - Continue present medications.                        -  If polyps are benign or adenomatous without                         dysplasia, I will advise NO further colonoscopy due to                         advanced age and/or severe comorbidity.                        - You do NOT require further colon cancer screening                         measures (Annual stool testing (i.e. hemoccult, FIT,                         cologuard), sigmoidoscopy, colonoscopy or CT                         colonography). You should share this recommendation                         with your Primary Care provider.                        - Return to GI office PRN.                        - The findings and recommendations were discussed with                         the patient. Procedure Code(s):     --- Professional ---                        [phone], Colonoscopy, flexible; with removal of                         tumor(s), polyp(s), or other lesion(s) by snare                         technique Diagnosis Code(s):     --- Professional ---  K64.1, Second degree hemorrhoids                        K63.5, Polyp of colon                        Z86.010, Personal history of colonic polyps CPT copyright 2019 American Medical Association. All rights reserved. The codes documented in this report are preliminary and upon coder review may  be revised to meet current compliance requirements. Efrain Sella MD, MD 06/17/2020 11:42:30 AM This report has been signed electronically. Number of Addenda: 0 Note Initiated On: 06/17/2020 11:00 AM Scope Withdrawal Time: 0 hours 5 minutes 3 seconds  Total Procedure Duration: 0 hours 13 minutes 51 seconds  Estimated Blood Loss:  Estimated blood loss: none.      [facility]

## 2020-06-17 NOTE — H&P (Signed)
Outpatient short stay form Pre-procedure 06/17/2020 10:52 AM Maria Patterson K. Alice Reichert, M.D.  Primary Physician: Orland Mustard, M.D.  Reason for visit: Personal history of adenomatous colon polyps  History of present illness:                            Patient presents for colonoscopy for a personal hx of colon polyps. The patient denies abdominal pain, abnormal weight loss or rectal bleeding.      Current Facility-Administered Medications:  .  0.9 %  sodium chloride infusion, , Intravenous, Continuous, [place], Benay Pike, MD, Last Rate: 20 mL/hr at 06/17/20 1009, Continued from Pre-op at 06/17/20 1009  Medications Prior to Admission  Medication Sig Dispense Refill Last Dose  . acetaminophen (TYLENOL) 325 MG tablet Take 2 tablets (650 mg total) by mouth every 6 (six) hours as needed. 60 tablet 0 Past Week at Unknown time  . aspirin 81 MG tablet Take 81 mg by mouth daily.   Past Week at Unknown time  . Cholecalciferol (VITAMIN D) 2000 units tablet Take 2,000 Units by mouth 3 (three) times a week.    Past Week at Unknown time  . diclofenac Sodium (VOLTAREN) 1 % GEL Apply 2-4 g topically 4 (four) times daily. 2 grams upper body and 4 grams lower body qid 150 g 11 Past Week at Unknown time  . ezetimibe (ZETIA) 10 MG tablet Take 1 tablet (10 mg total) by mouth daily. 90 tablet 3 Past Week at Unknown time  . fluticasone (FLONASE) 50 MCG/ACT nasal spray Place 2 sprays into both nostrils daily as needed for allergies or rhinitis. 16 g 11 Past Week at Unknown time  . levocetirizine (XYZAL) 5 MG tablet Take 0.5 tablets (2.5 mg total) by mouth every evening. 15 tablet 11 Past Week at Unknown time  . Multiple Vitamin (MULTIVITAMIN WITH MINERALS) TABS tablet Take 1 tablet by mouth 3 (three) times a week.    Past Week at Unknown time  . omeprazole (PRILOSEC OTC) 20 MG tablet Take 20 mg by mouth daily.   Past Week at Unknown time  . pantoprazole (PROTONIX) 40 MG tablet Take 1 tablet (40 mg total) by mouth  daily. 30 minutes before food 90 tablet 3 Past Week at Unknown time  . Polyethyl Glycol-Propyl Glycol (SYSTANE OP) Place 1 drop into both eyes daily as needed (dry eyes).    Past Week at Unknown time  . sodium chloride (OCEAN) 0.65 % SOLN nasal spray Place 1 spray into both nostrils as needed for congestion. 60 mL 11 Past Week at Unknown time  . atorvastatin (LIPITOR) 40 MG tablet Take 1 tablet (40 mg total) by mouth daily. At night 90 tablet 3   . ferrous sulfate 325 (65 FE) MG EC tablet Take 325 mg by mouth 3 (three) times a week.         Allergies  Allergen Reactions  . Latex Itching  . Penicillins Itching and Rash    Has patient had a PCN reaction causing immediate rash, facial/tongue/throat swelling, SOB or lightheadedness with hypotension: Yes Has patient had a PCN reaction causing severe rash involving mucus membranes or skin necrosis: No Has patient had a PCN reaction that required hospitalization No Has patient had a PCN reaction occurring within the last 10 years: No If all of the above answers are "NO", then may proceed with Cephalosporin use.      Past Medical History:  Diagnosis Date  . Anemia   .  Breast cancer ([place]) 2018   Left  . Breast mass, left 09/22/2016   RECOMMENDATION: Ultrasound-guided core biopsies of masses at left breast 2 o'clock 2 cm from nipple, left breast 2 o'clock 5 cm from nipple, abnormal left axillary lymph nodes.  Marland Kitchen COPD (chronic obstructive pulmonary disease) ([place])   . Depression   . GERD (gastroesophageal reflux disease)   . History of chickenpox   . Hx of adenomatous colonic polyps   . Hyperglycemia   . Hyperlipidemia   . Obesity   . Personal history of chemotherapy 2018   Left breast  . Personal history of radiation therapy 2018  . Postmenopausal   . Shingles    right eye     Review of systems:  Otherwise negative.    Physical Exam  Gen: Alert, oriented. Appears stated age.  HEENT: [place]/AT. PERRLA. Lungs: CTA, no wheezes. CV: RR  nl S1, S2. Abd: soft, benign, no masses. BS+ Ext: No edema. Pulses 2+    Planned procedures: Proceed with colonoscopy. The patient understands the nature of the planned procedure, indications, risks, alternatives and potential complications including but not limited to bleeding, infection, perforation, damage to internal organs and possible oversedation/side effects from anesthesia. The patient agrees and gives consent to proceed.  Please refer to procedure notes for findings, recommendations and patient disposition/instructions.     Maria Patterson K. Alice Reichert, M.D. Gastroenterology 06/17/2020  10:52 AM

## 2020-06-17 NOTE — Anesthesia Preprocedure Evaluation (Signed)
Anesthesia Evaluation  Patient identified by MRN, date of birth, ID band Patient awake    Reviewed: Allergy & Precautions, NPO status , Patient's Chart, lab work & pertinent test results  Airway Mallampati: III       Dental   Pulmonary neg sleep apnea, COPD (Pt denies), Not current smoker,           Cardiovascular (-) hypertension(-) Past MI and (-) CHF (-) dysrhythmias (-) Valvular Problems/Murmurs     Neuro/Psych neg Seizures Anxiety Depression    GI/Hepatic Neg liver ROS, GERD  Medicated,  Endo/Other  neg diabetes  Renal/GU Renal InsufficiencyRenal disease     Musculoskeletal   Abdominal   Peds  Hematology  (+) anemia ,   Anesthesia Other Findings   Reproductive/Obstetrics                             Anesthesia Physical Anesthesia Plan  ASA: II  Anesthesia Plan: General   Post-op Pain Management:    Induction: Intravenous  PONV Risk Score and Plan: 3 and Propofol infusion and TIVA  Airway Management Planned: Nasal Cannula  Additional Equipment:   Intra-op Plan:   Post-operative Plan:   Informed Consent: I have reviewed the patients History and Physical, chart, labs and discussed the procedure including the risks, benefits and alternatives for the proposed anesthesia with the patient or authorized representative who has indicated his/her understanding and acceptance.       Plan Discussed with:   Anesthesia Plan Comments:         Anesthesia Quick Evaluation

## 2020-06-17 NOTE — Anesthesia Postprocedure Evaluation (Signed)
Anesthesia Post Note  Patient: Maria Patterson  Procedure(s) Performed: COLONOSCOPY WITH PROPOFOL (N/A )  Patient location during evaluation: Endoscopy Anesthesia Type: General Level of consciousness: awake and alert Pain management: pain level controlled Vital Signs Assessment: post-procedure vital signs reviewed and stable Respiratory status: spontaneous breathing and respiratory function stable Cardiovascular status: stable Anesthetic complications: no   No complications documented.   Last Vitals:  Vitals:   06/17/20 0947 06/17/20 1125  BP: 124/78 (!) 111/57  Pulse: 81 77  Resp: 20 (!) 21  Temp: 36.4 C   SpO2: 100% 99%    Last Pain:  Vitals:   06/17/20 0947  TempSrc: Temporal  PainSc: 0-No pain                 Vila Dory K

## 2020-06-17 NOTE — Transfer of Care (Signed)
Immediate Anesthesia Transfer of Care Note  Patient: Maria Patterson  Procedure(s) Performed: COLONOSCOPY WITH PROPOFOL (N/A )  Patient Location: PACU and Endoscopy Unit  Anesthesia Type:General  Level of Consciousness: drowsy  Airway & Oxygen Therapy: Patient Spontanous Breathing  Post-op Assessment: Report given to RN and Post -op Vital signs reviewed and stable  Post vital signs: Reviewed and stable  Last Vitals:  Vitals Value Taken Time  BP 111/57 06/17/20 1125  Temp    Pulse 77 06/17/20 1125  Resp 21 06/17/20 1125  SpO2 99 % 06/17/20 1125    Last Pain:  Vitals:   06/17/20 0947  TempSrc: Temporal  PainSc: 0-No pain         Complications: No complications documented.

## 2020-06-17 NOTE — Anesthesia Procedure Notes (Signed)
Procedure Name: MAC Date/Time: 06/17/2020 11:03 AM Performed by: Lily Peer, Judyth Demarais, CRNA Pre-anesthesia Checklist: Patient identified, Emergency Drugs available, Patient being monitored and Suction available Oxygen Delivery Method: Nasal cannula

## 2020-06-18 ENCOUNTER — Encounter: Payer: Self-pay | Admitting: Internal Medicine

## 2020-06-18 LAB — SURGICAL PATHOLOGY

## 2020-07-01 ENCOUNTER — Encounter: Payer: Self-pay | Admitting: Internal Medicine

## 2020-07-23 ENCOUNTER — Ambulatory Visit: Payer: Medicare Other | Admitting: Internal Medicine

## 2020-09-16 ENCOUNTER — Telehealth: Payer: Self-pay | Admitting: Internal Medicine

## 2020-09-16 NOTE — Telephone Encounter (Signed)
Patient Prolia approved please schedule on or after 10/02/20

## 2020-09-17 DIAGNOSIS — R8290 Unspecified abnormal findings in urine: Secondary | ICD-10-CM | POA: Diagnosis not present

## 2020-09-17 DIAGNOSIS — N1832 Chronic kidney disease, stage 3b: Secondary | ICD-10-CM | POA: Diagnosis not present

## 2020-09-18 ENCOUNTER — Other Ambulatory Visit: Payer: Self-pay

## 2020-09-18 ENCOUNTER — Ambulatory Visit (INDEPENDENT_AMBULATORY_CARE_PROVIDER_SITE_OTHER): Payer: Medicare Other | Admitting: Internal Medicine

## 2020-09-18 ENCOUNTER — Encounter: Payer: Self-pay | Admitting: Internal Medicine

## 2020-09-18 ENCOUNTER — Telehealth: Payer: Self-pay | Admitting: Internal Medicine

## 2020-09-18 VITALS — BP 130/70 | HR 74 | Temp 97.5°F | Ht 61.0 in | Wt 178.6 lb

## 2020-09-18 DIAGNOSIS — M5136 Other intervertebral disc degeneration, lumbar region: Secondary | ICD-10-CM | POA: Diagnosis not present

## 2020-09-18 DIAGNOSIS — M542 Cervicalgia: Secondary | ICD-10-CM | POA: Diagnosis not present

## 2020-09-18 DIAGNOSIS — G8929 Other chronic pain: Secondary | ICD-10-CM

## 2020-09-18 DIAGNOSIS — R1030 Lower abdominal pain, unspecified: Secondary | ICD-10-CM

## 2020-09-18 DIAGNOSIS — K219 Gastro-esophageal reflux disease without esophagitis: Secondary | ICD-10-CM | POA: Diagnosis not present

## 2020-09-18 DIAGNOSIS — J3089 Other allergic rhinitis: Secondary | ICD-10-CM | POA: Diagnosis not present

## 2020-09-18 DIAGNOSIS — E785 Hyperlipidemia, unspecified: Secondary | ICD-10-CM | POA: Diagnosis not present

## 2020-09-18 DIAGNOSIS — I890 Lymphedema, not elsewhere classified: Secondary | ICD-10-CM

## 2020-09-18 DIAGNOSIS — M25511 Pain in right shoulder: Secondary | ICD-10-CM

## 2020-09-18 DIAGNOSIS — M25551 Pain in right hip: Secondary | ICD-10-CM

## 2020-09-18 DIAGNOSIS — M25561 Pain in right knee: Secondary | ICD-10-CM | POA: Diagnosis not present

## 2020-09-18 DIAGNOSIS — M5441 Lumbago with sciatica, right side: Secondary | ICD-10-CM

## 2020-09-18 MED ORDER — ATORVASTATIN CALCIUM 40 MG PO TABS: 40.0000 mg | ORAL_TABLET | Freq: Every day | ORAL | 3 refills | Status: DC

## 2020-09-18 MED ORDER — SALINE SPRAY 0.65 % NA SOLN: 1.0000 | NASAL | 11 refills | Status: DC | PRN

## 2020-09-18 MED ORDER — FLUTICASONE PROPIONATE 50 MCG/ACT NA SUSP
2.0000 | Freq: Every day | NASAL | 11 refills | Status: DC | PRN
Start: 2020-09-18 — End: 2021-08-06

## 2020-09-18 MED ORDER — EZETIMIBE 10 MG PO TABS
10.0000 mg | ORAL_TABLET | Freq: Every day | ORAL | 3 refills | Status: DC
Start: 2020-09-18 — End: 2021-08-06

## 2020-09-18 MED ORDER — PANTOPRAZOLE SODIUM 40 MG PO TBEC: 40.0000 mg | DELAYED_RELEASE_TABLET | Freq: Every day | ORAL | 3 refills | Status: DC

## 2020-09-18 NOTE — Progress Notes (Signed)
Chief Complaint  Patient presents with  . Follow-up  . Medication Refill   F/u  1. C/o diffuse bone and muscle aches and pain neck, right shoulder, right hip down b/l legs, right knee reviewed imaging h/o severe DDD and this could be cause of leg sx's. She also c/o right face and neck rash. She thinks related to prolia and had 2 injections of this and is due 09/2020 but wants to hold on prolia as thinks causing sx's  cc'ed h/o dr. Grayland Ormond to see if needs bone scan or cancer markers with h/o breast cancer  -call back for imaging neck, low back, right shoulder, right hip and knee if wanted w/u arthritis  Pain 8/10 rec tylenol as needed  2. Left arm lymphedema has compression on and does PT but still present discussed this is chronic condition PT does not feel like helping  3. H/o breast cancer upcoming appt with h/o  4. CKD 3B seen D.r Candiss Norse 09/17/20 pending labs will review 5. C/o lower abdominal pain lower denies UTI sx's will review labs 09/17/20 renal and consider imaging ct scan pt declines imaging for now  Review of Systems  Constitutional: Negative for weight loss.  HENT: Negative for hearing loss.   Eyes: Negative for blurred vision.  Respiratory: Negative for shortness of breath.   Cardiovascular: Negative for chest pain.  Gastrointestinal: Negative for abdominal pain.  Musculoskeletal: Positive for back pain, joint pain and neck pain. Negative for falls.  Skin: Positive for rash.  Neurological: Negative for headaches.  Psychiatric/Behavioral: Negative for memory loss.   Past Medical History:  Diagnosis Date  . Anemia   . Breast cancer ([place]) 2018   Left  . Breast mass, left 09/22/2016   RECOMMENDATION: Ultrasound-guided core biopsies of masses at left breast 2 o'clock 2 cm from nipple, left breast 2 o'clock 5 cm from nipple, abnormal left axillary lymph nodes.  Marland Kitchen COPD (chronic obstructive pulmonary disease) ([place])   . Depression   . GERD (gastroesophageal reflux disease)   .  History of chickenpox   . Hx of adenomatous colonic polyps   . Hyperglycemia   . Hyperlipidemia   . Obesity   . Personal history of chemotherapy 2018   Left breast  . Personal history of radiation therapy 2018  . Postmenopausal   . Shingles    right eye    Past Surgical History:  Procedure Laterality Date  . ABDOMINAL HYSTERECTOMY    . AXILLARY SENTINEL NODE BIOPSY Left 04/14/2017   Procedure: AXILLARY SENTINEL NODE BIOPSY;  Surgeon: Olean Ree, MD;  Location: [facility] ORS;  Service: General;  Laterality: Left;  . BREAST BIOPSY Left 09/30/2016   US biopsy of 3 areas + chemo  . BREAST EXCISIONAL BIOPSY Left 04/14/2017   Left breast invasive cancer left mastectomy  . COLONOSCOPY WITH PROPOFOL N/A 06/17/2020   Procedure: COLONOSCOPY WITH PROPOFOL;  Surgeon: [place], Benay Pike, MD;  Location: [facility] ENDOSCOPY;  Service: Gastroenterology;  Laterality: N/A;  . ESOPHAGOGASTRODUODENOSCOPY (EGD) WITH PROPOFOL N/A 12/24/2014   Procedure: ESOPHAGOGASTRODUODENOSCOPY (EGD) WITH PROPOFOL;  Surgeon: Manya Silvas, MD;  Location: [facility] ENDOSCOPY;  Service: Endoscopy;  Laterality: N/A;  . MASTECTOMY Left 04/14/2017   Left breast invasive cancer  . PORTACATH PLACEMENT Right 10/27/2016   Procedure: INSERTION PORT-A-CATH;  Surgeon: Nestor Lewandowsky, MD;  Location: [facility] ORS;  Service: General;  Laterality: Right;  . TONSILECTOMY, ADENOIDECTOMY, BILATERAL MYRINGOTOMY AND TUBES    . TONSILLECTOMY    . TOTAL MASTECTOMY Left 04/14/2017   Procedure:  TOTAL MASTECTOMY;  Surgeon: Olean Ree, MD;  Location: [facility] ORS;  Service: General;  Laterality: Left;   Family History  Problem Relation Age of Onset  . Stroke Mother   . Stroke Father   . Cancer Sister        with mets as of 09/18/20  . Depression Neg Hx   . Breast cancer Neg Hx    Social History   Socioeconomic History  . Marital status: Widowed    Spouse name: Not on file  . Number of children: 1  . Years of education: GED  . Highest education level:  12th grade  Occupational History  . Occupation: Retired  Tobacco Use  . Smoking status: Never Smoker  . Smokeless tobacco: Never Used  . Tobacco comment: smoking cessation materials not required  Vaping Use  . Vaping Use: Never used  Substance and Sexual Activity  . Alcohol use: No    Alcohol/week: 0.0 standard drinks  . Drug use: No  . Sexual activity: Not Currently  Other Topics Concern  . Not on file  Social History Narrative   Lives at home    1 kid [name] (daughter) [phone]   Social Determinants of Health   Financial Resource Strain: Not on file  Food Insecurity: Not on file  Transportation Needs: Not on file  Physical Activity: Not on file  Stress: Not on file  Social Connections: Not on file  Intimate Partner Violence: Not on file   Current Meds  Medication Sig  . acetaminophen (TYLENOL) 325 MG tablet Take 2 tablets (650 mg total) by mouth every 6 (six) hours as needed.  Marland Kitchen aspirin 81 MG tablet Take 81 mg by mouth daily.  . Cholecalciferol (VITAMIN D) 2000 units tablet Take 2,000 Units by mouth 3 (three) times a week.   . ferrous sulfate 325 (65 FE) MG EC tablet Take 325 mg by mouth 3 (three) times a week.   . Multiple Vitamin (MULTIVITAMIN WITH MINERALS) TABS tablet Take 1 tablet by mouth 3 (three) times a week.   Vladimir Faster Glycol-Propyl Glycol (SYSTANE OP) Place 1 drop into both eyes daily as needed (dry eyes).   . [DISCONTINUED] ezetimibe (ZETIA) 10 MG tablet Take 1 tablet (10 mg total) by mouth daily.  . [DISCONTINUED] fluticasone (FLONASE) 50 MCG/ACT nasal spray Place 2 sprays into both nostrils daily as needed for allergies or rhinitis.  . [DISCONTINUED] pantoprazole (PROTONIX) 40 MG tablet Take 1 tablet (40 mg total) by mouth daily. 30 minutes before food  . [DISCONTINUED] sodium chloride (OCEAN) 0.65 % SOLN nasal spray Place 1 spray into both nostrils as needed for congestion.   Allergies  Allergen Reactions  . Latex Itching  . Penicillins  Itching and Rash    Has patient had a PCN reaction causing immediate rash, facial/tongue/throat swelling, SOB or lightheadedness with hypotension: Yes Has patient had a PCN reaction causing severe rash involving mucus membranes or skin necrosis: No Has patient had a PCN reaction that required hospitalization No Has patient had a PCN reaction occurring within the last 10 years: No If all of the above answers are "NO", then may proceed with Cephalosporin use.    No results found for this or any previous visit (from the past 2160 hour(s)). Objective  Body mass index is 33.75 kg/m. Wt Readings from Last 3 Encounters:  09/18/20 178 lb 9.6 oz (81 kg)  06/17/20 170 lb (77.1 kg)  05/20/20 171 lb 6 oz (77.7 kg)   Temp Readings  from Last 3 Encounters:  09/18/20 (!) 97.5 F (36.4 C) (Oral)  06/17/20 97.6 F (36.4 C) (Temporal)  04/04/20 98.4 F (36.9 C)   BP Readings from Last 3 Encounters:  09/18/20 130/70  06/17/20 125/80  05/20/20 130/78   Pulse Readings from Last 3 Encounters:  09/18/20 74  06/17/20 80  05/20/20 75    Physical Exam Vitals and nursing note reviewed.  Constitutional:      Appearance: Normal appearance. She is well-developed and well-groomed. She is obese.  HENT:     Head: Normocephalic and atraumatic.  Eyes:     Conjunctiva/sclera: Conjunctivae normal.     Pupils: Pupils are equal, round, and reactive to light.  Cardiovascular:     Rate and Rhythm: Normal rate and regular rhythm.     Heart sounds: Normal heart sounds. No murmur heard.   Pulmonary:     Effort: Pulmonary effort is normal.     Breath sounds: Normal breath sounds.  Abdominal:     General: Abdomen is flat. Bowel sounds are normal.     Tenderness: There is no abdominal tenderness.  Lymphadenopathy:     Comments: Chronic lymphedema left arm   Skin:    General: Skin is warm and dry.  Neurological:     General: No focal deficit present.     Mental Status: She is alert and oriented to  person, place, and time. Mental status is at baseline.     Gait: Gait normal.  Psychiatric:        Attention and Perception: Attention and perception normal.        Mood and Affect: Mood and affect normal.        Speech: Speech normal.        Behavior: Behavior normal. Behavior is cooperative.        Thought Content: Thought content normal.        Cognition and Memory: Cognition and memory normal.        Judgment: Judgment normal.     Assessment  Plan  Cervicalgia - Plan: DG Cervical Spine Complete Chronic right-sided low back pain with right-sided sciatica - Plan: DG Lumbar Spine Complete Hip pain, chronic, right - Plan: DG Hip Unilat W OR W/O Pelvis 2-3 Views Right DDD (degenerative disc disease), lumbar Right knee pain Right shoulder pain -will call back if Xrays desired in each location rec Tylenol, voltaren gel   Lower abdominal pain Review renal labs 09/17/20 consider imaging in the future   Hyperlipidemia, unspecified hyperlipidemia type - Plan: atorvastatin (LIPITOR) 40 MG tablet, Lipid panel, ezetimibe (ZETIA) 10 MG tablet  Non-seasonal allergic rhinitis, unspecified trigger - Plan: fluticasone (FLONASE) 50 MCG/ACT nasal spray, sodium chloride (OCEAN) 0.65 % SOLN nasal spray  lymphedema of arm Has compression on continue pt though pt thinks will stop  Gastroesophageal reflux disease - Plan: pantoprazole (PROTONIX) 40 MG tablet   HM Flu [facility] and pna [facility] utd  Tdap and shingrix disc in futuredisc today she thinks she has had covid vx had 2/2had 3rd dose check dose [facility]  S/p left mastectomy3/9/10right mammonegative 09/26/18 reordered 2021 sch 09/30/20 c/o bone pain ? If should do bone scan/cancer markers with h/o  Chronic lymphedema left upper arm   Out of age window pap   Colonoscopy had years ago Dr. Tiffany Kocher -02/04/11 sessile tubular adenoma/IH; EGD duodenal polyp 12/24/14 neg path, hiatal hernia Referred [facility] had  06/17/20 tubular adenoma f/u in 5 years   DEXA+osteoporosistry to get prolia  approved had 09/2019 2nd dose in 03/2020 due   Hold prolia dose for 09/2020 pt thinks causing rash, joint pain and muscle aches   Dr. Candiss Norse 12/2019 f/u schseen 09/17/20 f/u 03/20/21   01/18/20 PHq 9 score 6 and GAD 7 score 0  Provider: Dr. Olivia Mackie McLean-Scocuzza-Internal Medicine

## 2020-09-18 NOTE — Patient Instructions (Addendum)
Can try tumeric capsules for arthritis nature made or nature wise brands Call back if you want neck, right shoulder, low back, right hip or right knee Xrays

## 2020-09-18 NOTE — Telephone Encounter (Signed)
Pt wanted a call back to discuss medication that were called in today

## 2020-09-19 NOTE — Telephone Encounter (Signed)
Patient declines Prolia for now

## 2020-09-19 NOTE — Telephone Encounter (Signed)
Left message to return call 

## 2020-09-20 ENCOUNTER — Telehealth: Payer: Self-pay | Admitting: Internal Medicine

## 2020-09-20 NOTE — Telephone Encounter (Signed)
Reviewed kidney labs they are stable and normal blood cts With abdominal pain does she want me to order abdominal imaging?

## 2020-09-23 NOTE — Telephone Encounter (Signed)
Left message to return call 

## 2020-09-28 NOTE — Progress Notes (Signed)
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  INTERVAL HISTORY: Patient returns to clinic today for routine 66-monthevaluation.  She continues to have problems with lymphedema in her left arm, but otherwise feels well. She has no neurologic complaints. She denies any recent fevers or illnesses. She has a good appetite and denies weight loss.  She denies any chest pain, shortness of breath, cough, or hemoptysis.  She denies any nausea, vomiting, constipation, or diarrhea. She has no urinary complaints.  Patient offers no further specific complaints today.  REVIEW OF SYSTEMS:   Review of Systems  Constitutional: Negative.  Negative for fever, malaise/fatigue and weight loss.  Respiratory: Negative.  Negative for cough and shortness of breath.   Cardiovascular: Negative.  Negative for chest pain and leg swelling.  Gastrointestinal: Negative.  Negative for abdominal pain and constipation.  Genitourinary: Negative.  Negative for dysuria.  Musculoskeletal: Negative.  Negative for back pain, joint pain and myalgias.  Skin: Negative.  Negative for rash.  Neurological: Negative.  Negative for sensory change, focal weakness, weakness and headaches.  Psychiatric/Behavioral: Negative for depression. The patient is not nervous/anxious.     As per  HPI. Otherwise, a complete review of systems is negative.  PAST MEDICAL HISTORY: Past Medical History:  Diagnosis Date   Anemia    [place]   [place]   Breast mass, left 09/22/2016   RECOMMENDATION: Ultrasound-guided core biopsies of masses at left breast 2 o'clock 2 cm from nipple, left breast 2 o'clock 5 cm from nipple, abnormal left axillary lymph nodes.   COPD (chronic obstructive pulmonary disease) (HCC)    Depression    GERD (gastroesophageal reflux disease)    History of chickenpox    Hx of adenomatous colonic polyps    Hyperglycemia    Hyperlipidemia    Obesity    Personal history of chemotherapy 2018   Left breast   Personal history of radiation therapy 2018   Postmenopausal    Shingles    right eye     PAST SURGICAL HISTORY: Past Surgical History:  Procedure Laterality Date   ABDOMINAL HYSTERECTOMY     AXILLARY SENTINEL NODE BIOPSY Left 04/14/2017   Procedure: AXILLARY SENTINEL NODE BIOPSY;  Surgeon: POlean Ree MD;  Location: [facility] ORS;  Service: General;  Laterality: Left;   BREAST BIOPSY Left 09/30/2016   UKoreabiopsy of 3 areas + chemo   BREAST EXCISIONAL BIOPSY Left 04/14/2017   Left breast invasive cancer left mastectomy   COLONOSCOPY WITH PROPOFOL N/A 06/17/2020   Procedure: COLONOSCOPY WITH PROPOFOL;  Surgeon: Toledo, TBenay Pike MD;  Location: [facility] END[facility];  Service: Gastroenterology;  Laterality: N/A;   ESOPHAGOGASTRODUODENOSCOPY (EGD) WITH PROPOFOL N/A 12/24/2014   Procedure: ESOPHAGOGASTRODUODENOSCOPY (EGD) WITH PROPOFOL;  Surgeon: RManya Silvas MD;  [facility];  Service: Endoscopy;  Laterality: N/A;   MASTECTOMY Left 04/14/2017   Left breast invasive cancer   PORTACATH PLACEMENT Right 10/27/2016   Procedure: INSERTION  PORT-A-CATH;  Surgeon: Nestor Lewandowsky, MD;  Location: [facility] ORS;  Service: General;  Laterality: Right;   TONSILECTOMY, ADENOIDECTOMY, BILATERAL MYRINGOTOMY AND TUBES     TONSILLECTOMY      TOTAL MASTECTOMY Left 04/14/2017   Procedure: TOTAL MASTECTOMY;  Surgeon: Olean Ree, MD;  Location: [facility] ORS;  Service: General;  Laterality: Left;    FAMILY HISTORY: Family History  Problem Relation Age of Onset   Stroke Mother    Stroke Father    Cancer Sister        with mets as of 09/18/20   Depression Neg Hx    Breast cancer Neg Hx     ADVANCED DIRECTIVES (Y/N):  N  HEALTH MAINTENANCE: Social History   Tobacco Use   Smoking status: Never Smoker   Smokeless tobacco: Never Used   Tobacco comment: smoking cessation materials not required  Vaping Use   Vaping Use: Never used  Substance Use Topics   Alcohol use: No    Alcohol/week: 0.0 standard drinks   Drug use: No     Colonoscopy:  PAP:  Bone density:  Lipid panel:  Allergies  Allergen Reactions   Latex Itching   Penicillins Itching and Rash    Has patient had a PCN reaction causing immediate rash, facial/tongue/throat swelling, SOB or lightheadedness with hypotension: Yes Has patient had a PCN reaction causing severe rash involving mucus membranes or skin necrosis: No Has patient had a PCN reaction that required hospitalization No Has patient had a PCN reaction occurring within the last 10 years: No If all of the above answers are "NO", then may proceed with Cephalosporin use.     Current Outpatient Medications  Medication Sig Dispense Refill   acetaminophen (TYLENOL) 325 MG tablet Take 2 tablets (650 mg total) by mouth every 6 (six) hours as needed. 60 tablet 0   aspirin 81 MG tablet Take 81 mg by mouth daily.     atorvastatin (LIPITOR) 40 MG tablet Take 1 tablet (40 mg total) by mouth daily. At night 90 tablet 3   Cholecalciferol (VITAMIN D) 2000 units tablet Take 2,000 Units by mouth 3 (three) times a week.      ezetimibe (ZETIA) 10 MG tablet Take 1 tablet (10 mg total) by mouth daily. 90 tablet 3   ferrous sulfate 325 (65 FE) MG EC tablet Take 325 mg by mouth 3 (three) times a  week.      fluticasone (FLONASE) 50 MCG/ACT nasal spray Place 2 sprays into both nostrils daily as needed for allergies or rhinitis. 16 g 11   Multiple Vitamin (MULTIVITAMIN WITH MINERALS) TABS tablet Take 1 tablet by mouth 3 (three) times a week.      pantoprazole (PROTONIX) 40 MG tablet Take 1 tablet (40 mg total) by mouth daily. 30 minutes before food 90 tablet 3   Polyethyl Glycol-Propyl Glycol (SYSTANE OP) Place 1 drop into both eyes daily as needed (dry eyes).      sodium chloride (OCEAN) 0.65 % SOLN nasal spray Place 1 spray into both nostrils as needed for congestion. 60 mL 11   levocetirizine (XYZAL) 5 MG tablet Take 0.5 tablets (2.5 mg total) by mouth every evening. (Patient not taking: No sig reported) 15 tablet 11   No current facility-administered medications for this visit.    OBJECTIVE: Vitals:   10/03/20 1054  BP: (!) 143/77  Pulse: 84  Temp: 98.2 F (36.8 C)     Body mass index is 32.88 kg/m.    ECOG FS:0 -  Asymptomatic  General: Well-developed, well-nourished, no acute distress. Eyes: Pink conjunctiva, anicteric sclera. HEENT: Normocephalic, moist mucous membranes. Breast: Left mastectomy. Lungs: No audible wheezing or coughing. Heart: Regular rate and rhythm. Abdomen: Soft, nontender, no obvious distention. Musculoskeletal: No edema, cyanosis, or clubbing. Neuro: Alert, answering all questions appropriately. Cranial nerves grossly intact. Skin: No rashes or petechiae noted. Psych: Normal affect.   LAB RESULTS:  Lab Results  Component Value Date   NA 141 01/11/2020   K 4.3 01/11/2020   CL 111 01/11/2020   CO2 25 01/11/2020   GLUCOSE 89 01/11/2020   BUN 23 01/11/2020   CREATININE 1.32 (H) 01/11/2020   CALCIUM 9.5 01/11/2020   PROT 6.9 01/11/2020   ALBUMIN 3.9 01/11/2020   AST 20 01/11/2020   ALT 16 01/11/2020   ALKPHOS 74 01/11/2020   BILITOT 0.4 01/11/2020   GFRNONAA 38 (L) 02/17/2019   GFRAA 44 (L) 02/17/2019    Lab Results  Component  Value Date   WBC 5.7 01/11/2020   NEUTROABS 2.7 01/11/2020   HGB 12.1 01/11/2020   HCT 36.6 01/11/2020   MCV 91.9 01/11/2020   PLT 199.0 01/11/2020     STUDIES: MM 3D SCREEN BREAST UNI RIGHT  Result Date: 09/30/2020 CLINICAL DATA:  Screening. EXAM: DIGITAL SCREENING UNILATERAL RIGHT MAMMOGRAM WITH CAD AND TOMOSYNTHESIS TECHNIQUE: Right screening digital craniocaudal and mediolateral oblique mammograms were obtained. Right screening digital breast tomosynthesis was performed. The images were evaluated with computer-aided detection. COMPARISON:  Previous exam(s). ACR Breast Density Category b: There are scattered areas of fibroglandular density. FINDINGS: There are no findings suspicious for malignancy. The images were evaluated with computer-aided detection. IMPRESSION: No mammographic evidence of malignancy. A result letter of this screening mammogram will be mailed directly to the patient. RECOMMENDATION: Screening mammogram in one year. (Code:SM-B-01Y) BI-RADS CATEGORY  1: Negative. Electronically Signed   By: Franki Cabot M.D.   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  PLAN:    1. 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 She completed neoadjuvant Taxotere, carboplatinum, Herceptin, and Perjeta on February 17, 2017. She had a total mastectomy on April 14, 2017, therefore did not require adjuvant XRT.  She completed her year long maintenance Herceptin on November 03, 2017.  An aromatase inhibitor would not offer benefit given the ER/PR negativity of her disease.  No intervention is needed.  Patient most recent right unilateral screening mammogram on September 30, 2020 was reported as BI-RADS 1.  Repeat in March 2023.  Patient also had a CT scan of the chest on September 19, 2018 did not reveal any evidence of disease.  Return to clinic in 1 year for routine evaluation.   2. Anemia: Resolved.   3. Renal insufficiency: Chronic and unchanged.  Follow-up with nephrology as indicated. 4.  Lymphedema: Patient reports she has a follow-up appointment in the lymphedema clinic in April 2022. 5.  Arthritis: Patient does not complain of this today.  Continue follow-up and treatment per PCP.  I spent a total of 20 minutes reviewing chart data, face-to-face evaluation with the patient, counseling and coordination of care as detailed above.    Patient expressed understanding and was in agreement with this plan. She also understands that She can call clinic at  any time with any questions, concerns, or complaints.   Cancer Staging Malignant neoplasm of upper-outer quadrant of left female breast [facility]) Staging form: Breast, AJCC 8th Edition - Clinical stage from 10/11/2016: Stage IIB (cT2, cN1, cM0, G3, ER: Negative, PR: Negative, HER2: Positive) - Signed by Lloyd Huger, MD on 10/11/2016 Histologic grading system: 3 grade system Laterality: Left - Pathologic stage from 04/23/2017: No Stage Recommended (ypT0, pN0, cM0, G3, ER: Negative, PR: Negative, HER2: Positive) - Signed by Lloyd Huger, MD on 04/23/2017 Stage prefix: Post-therapy Neoadjuvant therapy: Yes Response to neoadjuvant therapy: Complete response Histologic grading system: 3 grade system Laterality: Left   Lloyd Huger, MD   10/03/2020 11:44 AM

## 2020-09-30 ENCOUNTER — Ambulatory Visit
Admission: RE | Admit: 2020-09-30 | Discharge: 2020-09-30 | Disposition: A | Discharge status: Home / Self Care | Payer: Medicare Other | Source: Ambulatory Visit | Attending: Oncology | Admitting: Oncology

## 2020-09-30 ENCOUNTER — Other Ambulatory Visit: Payer: Self-pay

## 2020-09-30 DIAGNOSIS — Z171 Estrogen receptor negative status [ER-]: Secondary | ICD-10-CM | POA: Diagnosis not present

## 2020-09-30 DIAGNOSIS — C50412 Malignant neoplasm of upper-outer quadrant of left female breast: Secondary | ICD-10-CM | POA: Diagnosis not present

## 2020-09-30 DIAGNOSIS — Z1231 Encounter for screening mammogram for malignant neoplasm of breast: Secondary | ICD-10-CM | POA: Diagnosis not present

## 2020-10-03 ENCOUNTER — Other Ambulatory Visit: Payer: Self-pay

## 2020-10-03 ENCOUNTER — Inpatient Hospital Stay: Payer: Medicare Other | Attending: Oncology | Admitting: Oncology

## 2020-10-03 ENCOUNTER — Encounter: Payer: Self-pay | Admitting: Oncology

## 2020-10-03 VITALS — BP 143/77 | HR 84 | Temp 98.2°F | Wt 174.0 lb

## 2020-10-03 DIAGNOSIS — N289 Disorder of kidney and ureter, unspecified: Secondary | ICD-10-CM | POA: Insufficient documentation

## 2020-10-03 DIAGNOSIS — Z6832 Body mass index (BMI) 32.0-32.9, adult: Secondary | ICD-10-CM | POA: Insufficient documentation

## 2020-10-03 DIAGNOSIS — Z880 Allergy status to penicillin: Secondary | ICD-10-CM | POA: Insufficient documentation

## 2020-10-03 DIAGNOSIS — Z823 Family history of stroke: Secondary | ICD-10-CM | POA: Insufficient documentation

## 2020-10-03 DIAGNOSIS — Z171 Estrogen receptor negative status [ER-]: Secondary | ICD-10-CM | POA: Insufficient documentation

## 2020-10-03 DIAGNOSIS — Z79811 Long term (current) use of aromatase inhibitors: Secondary | ICD-10-CM | POA: Insufficient documentation

## 2020-10-03 DIAGNOSIS — I890 Lymphedema, not elsewhere classified: Secondary | ICD-10-CM | POA: Insufficient documentation

## 2020-10-03 DIAGNOSIS — Z79899 Other long term (current) drug therapy: Secondary | ICD-10-CM | POA: Diagnosis not present

## 2020-10-03 DIAGNOSIS — K219 Gastro-esophageal reflux disease without esophagitis: Secondary | ICD-10-CM | POA: Diagnosis not present

## 2020-10-03 DIAGNOSIS — C50412 Malignant neoplasm of upper-outer quadrant of left female breast: Secondary | ICD-10-CM | POA: Insufficient documentation

## 2020-10-03 DIAGNOSIS — Z809 Family history of malignant neoplasm, unspecified: Secondary | ICD-10-CM | POA: Diagnosis not present

## 2020-10-07 NOTE — Telephone Encounter (Signed)
Left message to return call. Letter sent

## 2020-10-09 ENCOUNTER — Ambulatory Visit (INDEPENDENT_AMBULATORY_CARE_PROVIDER_SITE_OTHER): Payer: Medicare Other | Admitting: Surgery

## 2020-10-09 ENCOUNTER — Encounter: Payer: Self-pay | Admitting: Surgery

## 2020-10-09 ENCOUNTER — Other Ambulatory Visit: Payer: Self-pay

## 2020-10-09 VITALS — BP 149/82 | HR 83 | Temp 98.8°F | Ht 62.0 in | Wt 175.2 lb

## 2020-10-09 DIAGNOSIS — C50412 Malignant neoplasm of upper-outer quadrant of left female breast: Secondary | ICD-10-CM

## 2020-10-09 DIAGNOSIS — Z171 Estrogen receptor negative status [ER-]: Secondary | ICD-10-CM

## 2020-10-09 NOTE — Progress Notes (Signed)
10/09/2020  History of Present Illness: Maria Patterson is a 80 y.o. female with a history of left breast cancer, s/p neoadjuvant chemotherapy followed by left mastectomy and SLNBx on 04/14/2017. She developed left upper extremity lymphedema and she has been using compression devices.  From the breast standpoint, she denies any new issues with the left mastectomy scar. Denies any findings on the right breast.  She had a right mammogram on 09/30/20 which was negative for any suspicious findings.  Past Medical History: Past Medical History:  Diagnosis Date  . Anemia   . Breast cancer ([place]) 2018   Left  . Breast mass, left 09/22/2016   RECOMMENDATION: Ultrasound-guided core biopsies of masses at left breast 2 o'clock 2 cm from nipple, left breast 2 o'clock 5 cm from nipple, abnormal left axillary lymph nodes.  Marland Kitchen COPD (chronic obstructive pulmonary disease) ([place])   . Depression   . GERD (gastroesophageal reflux disease)   . History of chickenpox   . Hx of adenomatous colonic polyps   . Hyperglycemia   . Hyperlipidemia   . Obesity   . Personal history of chemotherapy 2018   Left breast  . Personal history of radiation therapy 2018  . Postmenopausal   . Shingles    right eye      Past Surgical History: Past Surgical History:  Procedure Laterality Date  . ABDOMINAL HYSTERECTOMY    . AXILLARY SENTINEL NODE BIOPSY Left 04/14/2017   Procedure: AXILLARY SENTINEL NODE BIOPSY;  Surgeon: Olean Ree, MD;  Location: [facility] ORS;  Service: General;  Laterality: Left;  . BREAST BIOPSY Left 09/30/2016   US biopsy of 3 areas + chemo  . BREAST EXCISIONAL BIOPSY Left 04/14/2017   Left breast invasive cancer left mastectomy  . COLONOSCOPY WITH PROPOFOL N/A 06/17/2020   Procedure: COLONOSCOPY WITH PROPOFOL;  Surgeon: Toledo, Benay Pike, MD;  Location: [facility] END[facility]COPY;  Service: Gastroenterology;  Laterality: N/A;  . ESOPHAGOGASTRODUODENOSCOPY (EGD) WITH PROPOFOL N/A 12/24/2014   Procedure:  ESOPHAGOGASTRODUODENOSCOPY (EGD) WITH PROPOFOL;  Surgeon: Manya Silvas, MD;  [facility];  Service: Endoscopy;  Laterality: N/A;  . MASTECTOMY Left 04/14/2017   Left breast invasive cancer  . PORTACATH PLACEMENT Right 10/27/2016   Procedure: INSERTION PORT-A-CATH;  Surgeon: Nestor Lewandowsky, MD;  Location: [facility] ORS;  Service: General;  Laterality: Right;  . TONSILECTOMY, ADENOIDECTOMY, BILATERAL MYRINGOTOMY AND TUBES    . TONSILLECTOMY    . TOTAL MASTECTOMY Left 04/14/2017   Procedure: TOTAL MASTECTOMY;  Surgeon: Olean Ree, MD;  Location: [facility] ORS;  Service: General;  Laterality: Left;    Home Medications: Prior to Admission medications   Medication Sig Start Date End Date Taking? Authorizing Provider  acetaminophen (TYLENOL) 325 MG tablet Take 2 tablets (650 mg total) by mouth every 6 (six) hours as needed. 01/15/18  Yes Carrie Mew, MD  aspirin 81 MG tablet Take 81 mg by mouth daily.   Yes [provider]  Cholecalciferol (VITAMIN D) 2000 units tablet Take 2,000 Units by mouth 3 (three) times a week.  09/01/16  Yes Lada, Satira Anis, MD  ezetimibe (ZETIA) 10 MG tablet Take 1 tablet (10 mg total) by mouth daily. 09/14/19  Yes McLean-Scocuzza, Nino Glow, MD  ferrous sulfate 325 (65 FE) MG EC tablet Take 325 mg by mouth 3 (three) times a week.  09/01/16  Yes Lada, Satira Anis, MD  fluticasone (FLONASE) 50 MCG/ACT nasal spray Place 2 sprays into both nostrils daily as needed for allergies or rhinitis. 10/21/18  Yes Lada, Satira Anis,  MD  levocetirizine (XYZAL) 5 MG tablet Take 0.5 tablets (2.5 mg total) by mouth every evening. 03/30/19  Yes Guse, Jacquelynn Cree, FNP  Multiple Vitamin (MULTIVITAMIN WITH MINERALS) TABS tablet Take 1 tablet by mouth 3 (three) times a week.  09/01/16  Yes Lada, Satira Anis, MD  pantoprazole (PROTONIX) 20 MG tablet Take 1 tablet (20 mg total) by mouth daily. 30 minutes before food 03/17/19  Yes McLean-Scocuzza, Nino Glow, MD  Polyethyl Glycol-Propyl Glycol  (SYSTANE OP) Place 1 drop into both eyes daily as needed (dry eyes).    Yes [provider]  sodium chloride (OCEAN) 0.65 % SOLN nasal spray Place 1 spray into both nostrils as needed for congestion. 03/08/18  Yes Lada, Satira Anis, MD  atorvastatin (LIPITOR) 40 MG tablet Take 1 tablet (40 mg total) by mouth daily. 04/06/19 08/24/19  Kate Sable, MD    Allergies: Allergies  Allergen Reactions  . Latex Itching  . Penicillins Itching and Rash    Has patient had a PCN reaction causing immediate rash, facial/tongue/throat swelling, SOB or lightheadedness with hypotension: Yes Has patient had a PCN reaction causing severe rash involving mucus membranes or skin necrosis: No Has patient had a PCN reaction that required hospitalization No Has patient had a PCN reaction occurring within the last 10 years: No If all of the above answers are "NO", then may proceed with Cephalosporin use.     Review of Systems: Review of Systems  Constitutional: Negative for chills and fever.  Respiratory: Negative for shortness of breath.   Cardiovascular: Negative for chest pain.  Gastrointestinal: Negative for abdominal pain, nausea and vomiting.  Skin: Negative for rash.    Physical Exam BP (!) 149/82   Pulse 83   Temp 98.8 F (37.1 C) (Oral)   Ht 5\' 2"  (1.575 m)   Wt 175 lb 3.2 oz (79.5 kg)   SpO2 97%   BMI 32.04 kg/m  CONSTITUTIONAL: No acute distress HEENT:  Normocephalic, atraumatic, extraocular motion intact. RESPIRATORY:  Normal respiratory effort without pathologic use of accessory muscles. CARDIOVASCULAR:  Regular rhythm and rate. BREAST:  Left chest s/p mastectomy with well healed scar.  Stable area of small nodule at the mid portion of the incision. No other skin changes, palpable masses.  No left axillary or supraclavicular lymphadenopathy.  She does have evidence of LUE lymphedema with some edema of the axillary area as well.  Right breast without any palpable masses, skin  changes, or nipple changes.  Has right upper chest scar from port-a-cath, well healed.  No right axillary or supraclavicular lymphadenopathy.  NEUROLOGIC:  Motor and sensation is grossly normal.  Cranial nerves are grossly intact. PSYCH:  Alert and oriented to person, place and time. Affect is normal.  Labs/Imaging: Right Mammogram 09/30/20: FINDINGS: There are no findings suspicious for malignancy. The images were evaluated with computer-aided detection.  IMPRESSION: No mammographic evidence of malignancy. A result letter of this screening mammogram will be mailed directly to the patient.  RECOMMENDATION: Screening mammogram in one year. (Code:SM-B-01Y)  BI-RADS CATEGORY  1: Negative.  Assessment and Plan: This is a 80 y.o. female with left breast cancer s/p neoadjuvant chemo, followed by left mastectomy and SLNBx  -Discussed with the patient that her exam and mammogram are reassuring.  There are no new findings.  Encouraged her to continue with the compression devices and to ask at the lymphedema clinic about any options for the axillary edema. -Follow up in one year with right breast mammogram and exam.  Face-to-face time spent with the patient and care providers was 15 minutes, with more than 50% of the time spent counseling, educating, and coordinating care of the patient.     Melvyn Neth, [facility]

## 2020-10-09 NOTE — Patient Instructions (Signed)
We will contact you February 2023 to schedule your mammogram and breast exam for March 2023. Please call if you have any questions or concerns.

## 2020-11-11 DIAGNOSIS — Z86010 Personal history of colonic polyps: Secondary | ICD-10-CM | POA: Diagnosis not present

## 2020-11-11 DIAGNOSIS — R198 Other specified symptoms and signs involving the digestive system and abdomen: Secondary | ICD-10-CM | POA: Diagnosis not present

## 2020-11-11 DIAGNOSIS — K219 Gastro-esophageal reflux disease without esophagitis: Secondary | ICD-10-CM | POA: Diagnosis not present

## 2020-11-11 DIAGNOSIS — Z800 Family history of malignant neoplasm of digestive organs: Secondary | ICD-10-CM | POA: Diagnosis not present

## 2020-12-06 ENCOUNTER — Telehealth: Payer: Self-pay | Admitting: Internal Medicine

## 2020-12-06 NOTE — Telephone Encounter (Signed)
I attempted to contact patient, but she hung up.  If patient calls back, please schedule Medicare Annual Wellness Visit (AWV) in office.   If not able to come in office, please offer to do virtually or by telephone.   Last AWV:01/27/2018   Please schedule at anytime with Nurse Health Advisor.

## 2020-12-19 ENCOUNTER — Other Ambulatory Visit: Payer: Medicare Other

## 2020-12-20 ENCOUNTER — Telehealth: Payer: Self-pay | Admitting: Internal Medicine

## 2020-12-20 ENCOUNTER — Encounter: Payer: Self-pay | Admitting: Internal Medicine

## 2020-12-20 ENCOUNTER — Ambulatory Visit (INDEPENDENT_AMBULATORY_CARE_PROVIDER_SITE_OTHER): Payer: Medicare Other | Admitting: Internal Medicine

## 2020-12-20 ENCOUNTER — Other Ambulatory Visit: Payer: Self-pay

## 2020-12-20 ENCOUNTER — Ambulatory Visit (INDEPENDENT_AMBULATORY_CARE_PROVIDER_SITE_OTHER): Payer: Medicare Other

## 2020-12-20 VITALS — BP 124/64 | HR 67 | Temp 98.4°F | Ht 62.0 in | Wt 174.0 lb

## 2020-12-20 DIAGNOSIS — R591 Generalized enlarged lymph nodes: Secondary | ICD-10-CM | POA: Diagnosis not present

## 2020-12-20 DIAGNOSIS — M542 Cervicalgia: Secondary | ICD-10-CM

## 2020-12-20 DIAGNOSIS — M25561 Pain in right knee: Secondary | ICD-10-CM

## 2020-12-20 DIAGNOSIS — G8929 Other chronic pain: Secondary | ICD-10-CM

## 2020-12-20 DIAGNOSIS — L309 Dermatitis, unspecified: Secondary | ICD-10-CM

## 2020-12-20 DIAGNOSIS — I8390 Asymptomatic varicose veins of unspecified lower extremity: Secondary | ICD-10-CM | POA: Diagnosis not present

## 2020-12-20 HISTORY — DX: Asymptomatic varicose veins of unspecified lower extremity: I83.90

## 2020-12-20 MED ORDER — HYDROCORTISONE 2.5 % EX LOTN
TOPICAL_LOTION | Freq: Two times a day (BID) | CUTANEOUS | 2 refills | Status: DC
Start: 2020-12-20 — End: 2022-07-24

## 2020-12-20 NOTE — Progress Notes (Signed)
Chief Complaint  Patient presents with  . Leg Pain    On going pain in the right leg, near the back of the knee.   . Rash    Right side of the neck on going for months. States rash itches but has not spread   F/u  1. C/o right knee and post knee pain 5/10 nothing tried years ago she fell on knee pain rad to front of knee at times tries tylenol and helps  2. C/o fine bumps and rash to neck nothing tried no change in cosmetic products  3. Right jaw in front of  Ear c/o pain no f/u dental 1 year rec contact  4. C/o neck pain and popping tries tylenol   Review of Systems  Constitutional: Negative for weight loss.  HENT: Negative for hearing loss.   Eyes: Negative for blurred vision.  Respiratory: Negative for shortness of breath.   Cardiovascular: Negative for chest pain.  Musculoskeletal: Positive for joint pain and neck pain.  Skin: Positive for rash.  Psychiatric/Behavioral: Negative for memory loss.   Past Medical History:  Diagnosis Date  . Anemia   . Breast cancer ([place]) 2018   Left  . Breast mass, left 09/22/2016   RECOMMENDATION: Ultrasound-guided core biopsies of masses at left breast 2 o'clock 2 cm from nipple, left breast 2 o'clock 5 cm from nipple, abnormal left axillary lymph nodes.  Marland Kitchen COPD (chronic obstructive pulmonary disease) ([place])   . Depression   . GERD (gastroesophageal reflux disease)   . History of chickenpox   . Hx of adenomatous colonic polyps   . Hyperglycemia   . Hyperlipidemia   . Obesity   . Personal history of chemotherapy 2018   Left breast  . Personal history of radiation therapy 2018  . Postmenopausal   . Shingles    right eye    Past Surgical History:  Procedure Laterality Date  . ABDOMINAL HYSTERECTOMY    . AXILLARY SENTINEL NODE BIOPSY Left 04/14/2017   Procedure: AXILLARY SENTINEL NODE BIOPSY;  Surgeon: Olean Ree, MD;  Location: [facility] OR[facility];  Service: General;  Laterality: Left;  . BREAST BIOPSY Left 09/30/2016   US biopsy of 3 areas +  chemo  . BREAST EXCISIONAL BIOPSY Left 04/14/2017   Left breast invasive cancer left mastectomy  . COLONOSCOPY WITH PROPOFOL N/A 06/17/2020   Procedure: COLONOSCOPY WITH PROPOFOL;  Surgeon: Toledo, Benay Pike, MD;  Location: [facility];  Service: Gastroenterology;  Laterality: N/A;  . ESOPHAGOGASTRODUODENOSCOPY (EGD) WITH PROPOFOL N/A 12/24/2014   Procedure: ESOPHAGOGASTRODUODENOSCOPY (EGD) WITH PROPOFOL;  Surgeon: Manya Silvas, MD;  Location: [facility] ENDOSCOPY;  Service: Endoscopy;  Laterality: N/A;  . MASTECTOMY Left 04/14/2017   Left breast invasive cancer  . PORTACATH PLACEMENT Right 10/27/2016   Procedure: INSERTION PORT-A-CATH;  Surgeon: Nestor Lewandowsky, MD;  Location: [facility];  Service: General;  Laterality: Right;  . TONSILECTOMY, ADENOIDECTOMY, BILATERAL MYRINGOTOMY AND TUBES    . TONSILLECTOMY    . TOTAL MASTECTOMY Left 04/14/2017   Procedure: TOTAL MASTECTOMY;  Surgeon: Olean Ree, MD;  Location: [facility];  Service: General;  Laterality: Left;   Family History  Problem Relation Age of Onset  . Stroke Mother   . Stroke Father   . Cancer Sister        with mets as of 09/18/20  . Depression Neg Hx   . Breast cancer Neg Hx    Social History   Socioeconomic History  . Marital status: Widowed    Spouse name: Not  on file  . Number of children: 1  . Years of education: GED  . Highest education level: 12th grade  Occupational History  . Occupation: Retired  Tobacco Use  . Smoking status: Never Smoker  . Smokeless tobacco: Never Used  . Tobacco comment: smoking cessation materials not required  Vaping Use  . Vaping Use: Never used  Substance and Sexual Activity  . Alcohol use: No    Alcohol/week: 0.0 standard drinks  . Drug use: No  . Sexual activity: Not Currently  Other Topics Concern  . Not on file  Social History Narrative   Lives at home    1 kid [name] (daughter) [phone]   Social Determinants of Health   Financial Resource Strain: Not on file   Food Insecurity: Not on file  Transportation Needs: Not on file  Physical Activity: Not on file  Stress: Not on file  Social Connections: Not on file  Intimate Partner Violence: Not on file   Current Meds  Medication Sig  . acetaminophen (TYLENOL) 325 MG tablet Take 2 tablets (650 mg total) by mouth every 6 (six) hours as needed.  Marland Kitchen aspirin 81 MG tablet Take 81 mg by mouth daily.  Marland Kitchen atorvastatin (LIPITOR) 40 MG tablet Take 1 tablet (40 mg total) by mouth daily. At night  . Cholecalciferol (VITAMIN D) 2000 units tablet Take 2,000 Units by mouth 3 (three) times a week.   . ezetimibe (ZETIA) 10 MG tablet Take 1 tablet (10 mg total) by mouth daily.  . ferrous sulfate 325 (65 FE) MG EC tablet Take 325 mg by mouth 3 (three) times a week.   . fluticasone (FLONASE) 50 MCG/ACT nasal spray Place 2 sprays into both nostrils daily as needed for allergies or rhinitis.  . hydrocortisone 2.5 % lotion Apply topically 2 (two) times daily. Prn  . levocetirizine (XYZAL) 5 MG tablet Take 0.5 tablets (2.5 mg total) by mouth every evening.  . Multiple Vitamin (MULTIVITAMIN WITH MINERALS) TABS tablet Take 1 tablet by mouth 3 (three) times a week.   . pantoprazole (PROTONIX) 40 MG tablet Take 1 tablet (40 mg total) by mouth daily. 30 minutes before food  . Polyethyl Glycol-Propyl Glycol (SYSTANE OP) Place 1 drop into both eyes daily as needed (dry eyes).   . sodium chloride (OCEAN) 0.65 % SOLN nasal spray Place 1 spray into both nostrils as needed for congestion.   Allergies  Allergen Reactions  . Latex Itching  . Penicillins Itching and Rash    Has patient had a PCN reaction causing immediate rash, facial/tongue/throat swelling, SOB or lightheadedness with hypotension: Yes Has patient had a PCN reaction causing severe rash involving mucus membranes or skin necrosis: No Has patient had a PCN reaction that required hospitalization No Has patient had a PCN reaction occurring within the last 10 years: No If  all of the above answers are "NO", then may proceed with Cephalosporin use.    No results found for this or any previous visit (from the past 2160 hour(s)). Objective  Body mass index is 31.83 kg/m. Wt Readings from Last 3 Encounters:  12/20/20 174 lb (78.9 kg)  10/09/20 175 lb 3.2 oz (79.5 kg)  10/03/20 174 lb (78.9 kg)   Temp Readings from Last 3 Encounters:  12/20/20 98.4 F (36.9 C) (Oral)  10/09/20 98.8 F (37.1 C) (Oral)  10/03/20 98.2 F (36.8 C) (Tympanic)   BP Readings from Last 3 Encounters:  12/20/20 124/64  10/09/20 (!) 149/82  10/03/20 Marland Kitchen)  143/77   Pulse Readings from Last 3 Encounters:  12/20/20 67  10/09/20 83  10/03/20 84    Physical Exam Vitals and nursing note reviewed.  Constitutional:      Appearance: Normal appearance. She is well-developed and well-groomed. She is obese.  HENT:     Head: Normocephalic and atraumatic.  Eyes:     Conjunctiva/sclera: Conjunctivae normal.     Pupils: Pupils are equal, round, and reactive to light.  Cardiovascular:     Rate and Rhythm: Normal rate and regular rhythm.     Heart sounds: No murmur heard.   Pulmonary:     Effort: Pulmonary effort is normal.     Breath sounds: Normal breath sounds.  Abdominal:     General: Abdomen is flat. Bowel sounds are normal.  Skin:    General: Skin is warm and dry.  Neurological:     General: No focal deficit present.     Mental Status: She is alert and oriented to person, place, and time. Mental status is at baseline.     Gait: Gait normal.  Psychiatric:        Attention and Perception: Attention and perception normal.        Mood and Affect: Mood and affect normal.        Speech: Speech normal.        Behavior: Behavior normal. Behavior is cooperative.        Thought Content: Thought content normal.        Cognition and Memory: Cognition and memory normal.        Judgment: Judgment normal.     Assessment  Plan  Cervicalgia - Plan: DG Cervical Spine Complete, US  Soft Tissue Head/Neck (NON-THYROID) Chronic pain of right knee - Plan: DG Knee Complete 4 Views Right Prn Tylenol  Disc consider voltaren gel qid prn   Dermatitis neck - Plan: hydrocortisone 2.5 % lotion  Lymphadenopathy - Plan: US Soft Tissue Head/Neck (NON-THYROID)  Spider vein of lower extremity w/o sx's currently   HM Flu shotutdcheck date pharmacy [facility] prevnar and pna 23 utd  Tdap and shingrix disc in futuredisc today she thinks she has had covid vx had 3/3had 4th dose check dose [facility]  S/p left mastectomy3/9/10right mammonegative 09/26/18 reordered 2021 sch 09/30/20 c/o bone pain ? If should do bone scan/cancer markers with h/o  Chronic lymphedema left upper arm   Out of age window pap   Colonoscopy had years ago Dr. Tiffany Kocher -02/04/11 sessile tubular adenoma/IH; EGD duodenal polyp 12/24/14 neg path, hiatal hernia Referred [facility] 06/17/20 tubular adenoma f/u in 5 years   DEXA+osteoporosistry to get prolia approvedhad 09/2019 2nd dose in 03/2020 due Hold prolia dose for 09/2020 pt thinks causing rash, joint pain and muscle aches   Dr. Candiss Norse 12/2019 f/u schseen 09/17/20 f/u 03/20/21   01/18/20 PHq 9 score 6 and GAD 7 score 0 Provider: Dr. Olivia Mackie McLean-Scocuzza-[facility]

## 2020-12-20 NOTE — Telephone Encounter (Signed)
lft pt vm to call ofc to sch [place] . thanks 

## 2020-12-20 NOTE — Patient Instructions (Addendum)
Consider 4th moderna vaccine  Make appt with dentist please  Voltaren gel 4x per day knee and Tylenol  Heating pad   Knee Exercises Ask your health care provider which exercises are safe for you. Do exercises exactly as told by your health care provider and adjust them as directed. It is normal to feel mild stretching, pulling, tightness, or discomfort as you do these exercises. Stop right away if you feel sudden pain or your pain gets worse. Do not begin these exercises until told by your health care provider. Stretching and range-of-motion exercises These exercises warm up your muscles and joints and improve the movement and flexibility of your knee. These exercises also help to relieve pain and swelling. Knee extension, prone 1. Lie on your abdomen (prone position) on a bed. 2. Place your left / right knee just beyond the edge of the surface so your knee is not on the bed. You can put a towel under your left / right thigh just above your kneecap for comfort. 3. Relax your leg muscles and allow gravity to straighten your knee (extension). You should feel a stretch behind your left / right knee. 4. Hold this position for __________ seconds. 5. Scoot up so your knee is supported between repetitions. Repeat __________ times. Complete this exercise __________ times a day. Knee flexion, active 1. Lie on your back with both legs straight. If this causes back discomfort, bend your left / right knee so your foot is flat on the floor. 2. Slowly slide your left / right heel back toward your buttocks. Stop when you feel a gentle stretch in the front of your knee or thigh (flexion). 3. Hold this position for __________ seconds. 4. Slowly slide your left / right heel back to the starting position. Repeat __________ times. Complete this exercise __________ times a day.   Quadriceps stretch, prone 1. Lie on your abdomen on a firm surface, such as a bed or padded floor. 2. Bend your left / right knee and  hold your ankle. If you cannot reach your ankle or pant leg, loop a belt around your foot and grab the belt instead. 3. Gently pull your heel toward your buttocks. Your knee should not slide out to the side. You should feel a stretch in the front of your thigh and knee (quadriceps). 4. Hold this position for __________ seconds. Repeat __________ times. Complete this exercise __________ times a day.   Hamstring, supine 1. Lie on your back (supine position). 2. Loop a belt or towel over the ball of your left / right foot. The ball of your foot is on the walking surface, right under your toes. 3. Straighten your left / right knee and slowly pull on the belt to raise your leg until you feel a gentle stretch behind your knee (hamstring). ? Do not let your knee bend while you do this. ? Keep your other leg flat on the floor. 4. Hold this position for __________ seconds. Repeat __________ times. Complete this exercise __________ times a day. Strengthening exercises These exercises build strength and endurance in your knee. Endurance is the ability to use your muscles for a long time, even after they get tired. Quadriceps, isometric This exercise stretches the muscles in front of your thigh (quadriceps) without moving your knee joint (isometric). 1. Lie on your back with your left / right leg extended and your other knee bent. Put a rolled towel or small pillow under your knee if told by your health care  provider. 2. Slowly tense the muscles in the front of your left / right thigh. You should see your kneecap slide up toward your hip or see increased dimpling just above the knee. This motion will push the back of the knee toward the floor. 3. For __________ seconds, hold the muscle as tight as you can without increasing your pain. 4. Relax the muscles slowly and completely. Repeat __________ times. Complete this exercise __________ times a day.   Straight leg raises This exercise stretches the muscles  in front of your thigh (quadriceps) and the muscles that move your hips (hip flexors). 1. Lie on your back with your left / right leg extended and your other knee bent. 2. Tense the muscles in the front of your left / right thigh. You should see your kneecap slide up or see increased dimpling just above the knee. Your thigh may even shake a bit. 3. Keep these muscles tight as you raise your leg 4-6 inches (10-15 cm) off the floor. Do not let your knee bend. 4. Hold this position for __________ seconds. 5. Keep these muscles tense as you lower your leg. 6. Relax your muscles slowly and completely after each repetition. Repeat __________ times. Complete this exercise __________ times a day. Hamstring, isometric 1. Lie on your back on a firm surface. 2. Bend your left / right knee about __________ degrees. 3. Dig your left / right heel into the surface as if you are trying to pull it toward your buttocks. Tighten the muscles in the back of your thighs (hamstring) to "dig" as hard as you can without increasing any pain. 4. Hold this position for __________ seconds. 5. Release the tension gradually and allow your muscles to relax completely for __________ seconds after each repetition. Repeat __________ times. Complete this exercise __________ times a day. Hamstring curls If told by your health care provider, do this exercise while wearing ankle weights. Begin with __________ lb weights. Then increase the weight by 1 lb (0.5 kg) increments. Do not wear ankle weights that are more than __________ lb. 1. Lie on your abdomen with your legs straight. 2. Bend your left / right knee as far as you can without feeling pain. Keep your hips flat against the floor. 3. Hold this position for __________ seconds. 4. Slowly lower your leg to the starting position. Repeat __________ times. Complete this exercise __________ times a day.   Squats This exercise strengthens the muscles in front of your thigh and knee  (quadriceps). 1. Stand in front of a table, with your feet and knees pointing straight ahead. You may rest your hands on the table for balance but not for support. 2. Slowly bend your knees and lower your hips like you are going to sit in a chair. ? Keep your weight over your heels, not over your toes. ? Keep your lower legs upright so they are parallel with the table legs. ? Do not let your hips go lower than your knees. ? Do not bend lower than told by your health care provider. ? If your knee pain increases, do not bend as low. 3. Hold the squat position for __________ seconds. 4. Slowly push with your legs to return to standing. Do not use your hands to pull yourself to standing. Repeat __________ times. Complete this exercise __________ times a day. Wall slides This exercise strengthens the muscles in front of your thigh and knee (quadriceps). 1. Lean your back against a smooth wall or door, and  walk your feet out 18-24 inches (46-61 cm) from it. 2. Place your feet hip-width apart. 3. Slowly slide down the wall or door until your knees bend __________ degrees. Keep your knees over your heels, not over your toes. Keep your knees in line with your hips. 4. Hold this position for __________ seconds. Repeat __________ times. Complete this exercise __________ times a day.   Straight leg raises This exercise strengthens the muscles that rotate the leg at the hip and move it away from your body (hip abductors). 1. Lie on your side with your left / right leg in the top position. Lie so your head, shoulder, knee, and hip line up. You may bend your bottom knee to help you keep your balance. 2. Roll your hips slightly forward so your hips are stacked directly over each other and your left / right knee is facing forward. 3. Leading with your heel, lift your top leg 4-6 inches (10-15 cm). You should feel the muscles in your outer hip lifting. ? Do not let your foot drift forward. ? Do not let your  knee roll toward the ceiling. 4. Hold this position for __________ seconds. 5. Slowly return your leg to the starting position. 6. Let your muscles relax completely after each repetition. Repeat __________ times. Complete this exercise __________ times a day.   Straight leg raises This exercise stretches the muscles that move your hips away from the front of the pelvis (hip extensors). 1. Lie on your abdomen on a firm surface. You can put a pillow under your hips if that is more comfortable. 2. Tense the muscles in your buttocks and lift your left / right leg about 4-6 inches (10-15 cm). Keep your knee straight as you lift your leg. 3. Hold this position for __________ seconds. 4. Slowly lower your leg to the starting position. 5. Let your leg relax completely after each repetition. Repeat __________ times. Complete this exercise __________ times a day. This information is not intended to replace advice given to you by your health care provider. Make sure you discuss any questions you have with your health care provider. Document Revised: 04/26/2018 Document Reviewed: 04/26/2018 Elsevier Patient Education  2021 [employer].

## 2020-12-23 NOTE — Progress Notes (Signed)
Left message to return call 

## 2021-01-10 ENCOUNTER — Telehealth: Payer: Self-pay | Admitting: Internal Medicine

## 2021-01-10 NOTE — Telephone Encounter (Signed)
Lft pt vm to call ofc to sch [place]. thanks ?

## 2021-01-17 ENCOUNTER — Telehealth: Payer: Self-pay | Admitting: Internal Medicine

## 2021-01-17 NOTE — Telephone Encounter (Signed)
Lft pt vm to call ofc to sch [place]. thanks ?

## 2021-01-29 ENCOUNTER — Other Ambulatory Visit: Payer: Self-pay

## 2021-01-29 ENCOUNTER — Other Ambulatory Visit: Payer: Medicare Other

## 2021-02-04 ENCOUNTER — Ambulatory Visit (INDEPENDENT_AMBULATORY_CARE_PROVIDER_SITE_OTHER): Payer: Medicare Other | Admitting: Internal Medicine

## 2021-02-04 ENCOUNTER — Encounter: Payer: Self-pay | Admitting: Internal Medicine

## 2021-02-04 ENCOUNTER — Other Ambulatory Visit: Payer: Self-pay

## 2021-02-04 ENCOUNTER — Telehealth: Payer: Self-pay | Admitting: Internal Medicine

## 2021-02-04 VITALS — BP 124/72 | HR 71 | Temp 97.7°F | Ht 62.0 in | Wt 176.2 lb

## 2021-02-04 DIAGNOSIS — H9201 Otalgia, right ear: Secondary | ICD-10-CM | POA: Diagnosis not present

## 2021-02-04 DIAGNOSIS — G8929 Other chronic pain: Secondary | ICD-10-CM | POA: Diagnosis not present

## 2021-02-04 DIAGNOSIS — Z853 Personal history of malignant neoplasm of breast: Secondary | ICD-10-CM | POA: Diagnosis not present

## 2021-02-04 DIAGNOSIS — N1831 Chronic kidney disease, stage 3a: Secondary | ICD-10-CM | POA: Diagnosis not present

## 2021-02-04 DIAGNOSIS — E559 Vitamin D deficiency, unspecified: Secondary | ICD-10-CM

## 2021-02-04 DIAGNOSIS — R519 Headache, unspecified: Secondary | ICD-10-CM | POA: Diagnosis not present

## 2021-02-04 DIAGNOSIS — C50412 Malignant neoplasm of upper-outer quadrant of left female breast: Secondary | ICD-10-CM | POA: Diagnosis not present

## 2021-02-04 DIAGNOSIS — M47812 Spondylosis without myelopathy or radiculopathy, cervical region: Secondary | ICD-10-CM

## 2021-02-04 DIAGNOSIS — R5383 Other fatigue: Secondary | ICD-10-CM | POA: Diagnosis not present

## 2021-02-04 DIAGNOSIS — J329 Chronic sinusitis, unspecified: Secondary | ICD-10-CM

## 2021-02-04 DIAGNOSIS — I6522 Occlusion and stenosis of left carotid artery: Secondary | ICD-10-CM | POA: Diagnosis not present

## 2021-02-04 DIAGNOSIS — E785 Hyperlipidemia, unspecified: Secondary | ICD-10-CM | POA: Diagnosis not present

## 2021-02-04 DIAGNOSIS — Z1329 Encounter for screening for other suspected endocrine disorder: Secondary | ICD-10-CM | POA: Diagnosis not present

## 2021-02-04 DIAGNOSIS — R0982 Postnasal drip: Secondary | ICD-10-CM | POA: Diagnosis not present

## 2021-02-04 DIAGNOSIS — D649 Anemia, unspecified: Secondary | ICD-10-CM | POA: Diagnosis not present

## 2021-02-04 DIAGNOSIS — Z171 Estrogen receptor negative status [ER-]: Secondary | ICD-10-CM

## 2021-02-04 HISTORY — DX: Personal history of malignant neoplasm of breast: Z85.3

## 2021-02-04 LAB — CBC WITH DIFFERENTIAL/PLATELET
Basophils Absolute: 0 10*3/uL (ref 0.0–0.1)
Basophils Relative: 0.6 % (ref 0.0–3.0)
Eosinophils Absolute: 0.1 10*3/uL (ref 0.0–0.7)
Eosinophils Relative: 2.6 % (ref 0.0–5.0)
HCT: 37.4 % (ref 36.0–46.0)
Hemoglobin: 12.3 g/dL (ref 12.0–15.0)
Lymphocytes Relative: 33.2 % (ref 12.0–46.0)
Lymphs Abs: 1.9 10*3/uL (ref 0.7–4.0)
MCHC: 32.9 g/dL (ref 30.0–36.0)
MCV: 92.4 fl (ref 78.0–100.0)
Monocytes Absolute: 0.4 10*3/uL (ref 0.1–1.0)
Monocytes Relative: 7.2 % (ref 3.0–12.0)
Neutro Abs: 3.2 10*3/uL (ref 1.4–7.7)
Neutrophils Relative %: 56.4 % (ref 43.0–77.0)
Platelets: 197 10*3/uL (ref 150.0–400.0)
RBC: 4.05 Mil/uL (ref 3.87–5.11)
RDW: 15 % (ref 11.5–15.5)
WBC: 5.7 10*3/uL (ref 4.0–10.5)

## 2021-02-04 LAB — IRON,TIBC AND FERRITIN PANEL
%SAT: 31 % (calc) (ref 16–45)
Ferritin: 166 ng/mL (ref 16–288)
Iron: 77 ug/dL (ref 45–160)
TIBC: 247 mcg/dL (calc) — ABNORMAL LOW (ref 250–450)

## 2021-02-04 LAB — LIPID PANEL
Cholesterol: 203 mg/dL — ABNORMAL HIGH (ref 0–200)
HDL: 67 mg/dL (ref >39.00)
LDL Cholesterol: 120 mg/dL — ABNORMAL HIGH (ref 0–99)
NonHDL: 135.88
Total CHOL/HDL Ratio: 3
Triglycerides: 80 mg/dL (ref 0.0–149.0)
VLDL: 16 mg/dL (ref 0.0–40.0)

## 2021-02-04 LAB — C-REACTIVE PROTEIN: CRP: 1 mg/dL (ref 0.5–20.0)

## 2021-02-04 LAB — COMPREHENSIVE METABOLIC PANEL
ALT: 17 U/L (ref 0–35)
AST: 23 U/L (ref 0–37)
Albumin: 4.1 g/dL (ref 3.5–5.2)
Alkaline Phosphatase: 91 U/L (ref 39–117)
BUN: 28 mg/dL — ABNORMAL HIGH (ref 6–23)
CO2: 25 mEq/L (ref 19–32)
Calcium: 9.7 mg/dL (ref 8.4–10.5)
Chloride: 110 mEq/L (ref 96–112)
Creatinine, Ser: 1.35 mg/dL — ABNORMAL HIGH (ref 0.40–1.20)
GFR: 36.84 mL/min — ABNORMAL LOW (ref >60.00)
Glucose, Bld: 82 mg/dL (ref 70–99)
Potassium: 4.9 mEq/L (ref 3.5–5.1)
Sodium: 143 mEq/L (ref 135–145)
Total Bilirubin: 0.5 mg/dL (ref 0.2–1.2)
Total Protein: 6.7 g/dL (ref 6.0–8.3)

## 2021-02-04 LAB — SEDIMENTATION RATE: Sed Rate: 15 mm/hr (ref 0–30)

## 2021-02-04 LAB — VITAMIN D 25 HYDROXY (VIT D DEFICIENCY, FRACTURES): VITD: 46.7 ng/mL (ref 30.00–100.00)

## 2021-02-04 LAB — TSH: TSH: 1.15 u[IU]/mL (ref 0.35–5.50)

## 2021-02-04 NOTE — Progress Notes (Signed)
Right back side head pain, right hear pain. Tenderness at the top of the head. No known injuries. Patient would like imaging done of her sinuses.

## 2021-02-04 NOTE — Patient Instructions (Addendum)
Please do 4th covid 19 shot and Tdap at pharmacy  Sarna lotion for itching  Calamine or caladryl

## 2021-02-04 NOTE — Telephone Encounter (Signed)
Lft pt vm to call ofc to sch MRI. thanks 

## 2021-02-04 NOTE — Progress Notes (Signed)
Chief Complaint  Patient presents with   Sinus Problem   F/u  1. C/o crown of head h/a 5/10 and pain radiates to right ear chronically and feels like right face/neck swollen and top of head and era tender and feels like something in ear. H/o sinutitis/pain and pressure and pnd can smell ok and no obstruction ct 12/2020 with left carotid stenosis likely will do [place] to work up and feels like something in neck swollen appt 02/17/21 US neck nonthyroid will move all 3 appts to hospital reviewed ct neck +arthritis   Review of Systems  Constitutional:  Negative for weight loss.  HENT:  Positive for ear pain. Negative for hearing loss and tinnitus.   Eyes:  Negative for blurred vision.  Respiratory:  Negative for shortness of breath.   Cardiovascular:  Negative for chest pain.  Musculoskeletal:  Positive for neck pain.  Skin:  Negative for rash.  Neurological:  Positive for headaches. Negative for dizziness.  Psychiatric/Behavioral:  Negative for memory loss.   Past Medical History:  Diagnosis Date   Anemia    Breast cancer ([place]) 2018   Left   Breast mass, left 09/22/2016   RECOMMENDATION: Ultrasound-guided core biopsies of masses at left breast 2 o'clock 2 cm from nipple, left breast 2 o'clock 5 cm from nipple, abnormal left axillary lymph nodes.   COPD (chronic obstructive pulmonary disease) (HCC)    Depression    GERD (gastroesophageal reflux disease)    History of chickenpox    Hx of adenomatous colonic polyps    Hyperglycemia    Hyperlipidemia    Obesity    Personal history of chemotherapy 2018   Left breast   Personal history of radiation therapy 2018   Postmenopausal    Shingles    right eye    Past Surgical History:  Procedure Laterality Date   ABDOMINAL HYSTERECTOMY     AXILLARY SENTINEL NODE BIOPSY Left 04/14/2017   Procedure: AXILLARY SENTINEL NODE BIOPSY;  Surgeon: Olean Ree, MD;  Location: [facility] ORS;  Service: General;  Laterality: Left;   BREAST BIOPSY Left 09/30/2016    US biopsy of 3 areas + chemo   BREAST EXCISIONAL BIOPSY Left 04/14/2017   Left breast invasive cancer left mastectomy   COLONOSCOPY WITH PROPOFOL N/A 06/17/2020   Procedure: COLONOSCOPY WITH PROPOFOL;  Surgeon: Toledo, Benay Pike, MD;  Location: [facility] END[facility];  Service: Gastroenterology;  Laterality: N/A;   ESOPHAGOGASTRODUODENOSCOPY (EGD) WITH PROPOFOL N/A 12/24/2014   Procedure: ESOPHAGOGASTRODUODENOSCOPY (EGD) WITH PROPOFOL;  Surgeon: Manya Silvas, MD;  Location: [facility] ENDOSCOPY;  Service: Endoscopy;  Laterality: N/A;   MASTECTOMY Left 04/14/2017   Left breast invasive cancer   PORTACATH PLACEMENT Right 10/27/2016   Procedure: INSERTION PORT-A-CATH;  Surgeon: Nestor Lewandowsky, MD;  Location: [facility] OR[facility];  Service: General;  Laterality: Right;   TONSILECTOMY, ADENOIDECTOMY, BILATERAL MYRINGOTOMY AND TUBES     TONSILLECTOMY     TOTAL MASTECTOMY Left 04/14/2017   Procedure: TOTAL MASTECTOMY;  Surgeon: Olean Ree, MD;  Location: [facility] OR[facility];  Service: General;  Laterality: Left;   Family History  Problem Relation Age of Onset   Stroke Mother    Stroke Father    Cancer Sister        with mets as of 09/18/20   Depression Neg Hx    Breast cancer Neg Hx    Social History   Socioeconomic History   Marital status: Widowed    Spouse name: Not on file   Number of children: 1  Years of education: GED   Highest education level: 12th grade  Occupational History   Occupation: Retired  Tobacco Use   Smoking status: Never   Smokeless tobacco: Never   Tobacco comments:    smoking cessation materials not required  Vaping Use   Vaping Use: Never used  Substance and Sexual Activity   Alcohol use: No    Alcohol/week: 0.0 standard drinks   Drug use: No   Sexual activity: Not Currently  Other Topics Concern   Not on file  Social History Narrative   Lives at home    1 kid [name] (daughter) [phone]   Social Determinants of Health   Financial Resource Strain: Not on file  Food  Insecurity: Not on file  Transportation Needs: Not on file  Physical Activity: Not on file  Stress: Not on file  Social Connections: Not on file  Intimate Partner Violence: Not on file   Current Meds  Medication Sig   acetaminophen (TYLENOL) 325 MG tablet Take 2 tablets (650 mg total) by mouth every 6 (six) hours as needed.   aspirin 81 MG tablet Take 81 mg by mouth daily.   atorvastatin (LIPITOR) 40 MG tablet Take 1 tablet (40 mg total) by mouth daily. At night   Cholecalciferol (VITAMIN D) 2000 units tablet Take 2,000 Units by mouth 3 (three) times a week.    ezetimibe (ZETIA) 10 MG tablet Take 1 tablet (10 mg total) by mouth daily.   ferrous sulfate 325 (65 FE) MG EC tablet Take 325 mg by mouth 3 (three) times a week.    fluticasone (FLONASE) 50 MCG/ACT nasal spray Place 2 sprays into both nostrils daily as needed for allergies or rhinitis.   hydrocortisone 2.5 % lotion Apply topically 2 (two) times daily. Prn   levocetirizine (XYZAL) 5 MG tablet Take 0.5 tablets (2.5 mg total) by mouth every evening.   Multiple Vitamin (MULTIVITAMIN WITH MINERALS) TABS tablet Take 1 tablet by mouth 3 (three) times a week.    pantoprazole (PROTONIX) 40 MG tablet Take 1 tablet (40 mg total) by mouth daily. 30 minutes before food   Polyethyl Glycol-Propyl Glycol (SYSTANE OP) Place 1 drop into both eyes daily as needed (dry eyes).    sodium chloride (OCEAN) 0.65 % SOLN nasal spray Place 1 spray into both nostrils as needed for congestion.   Allergies  Allergen Reactions   Latex Itching   Penicillins Itching and Rash    Has patient had a PCN reaction causing immediate rash, facial/tongue/throat swelling, SOB or lightheadedness with hypotension: Yes Has patient had a PCN reaction causing severe rash involving mucus membranes or skin necrosis: No Has patient had a PCN reaction that required hospitalization No Has patient had a PCN reaction occurring within the last 10 years: No If all of the above answers  are "NO", then may proceed with Cephalosporin use.    No results found for this or any previous visit (from the past 2160 hour(s)). Objective  Body mass index is 32.23 kg/m. Wt Readings from Last 3 Encounters:  02/04/21 176 lb 3.2 oz (79.9 kg)  12/20/20 174 lb (78.9 kg)  10/09/20 175 lb 3.2 oz (79.5 kg)   Temp Readings from Last 3 Encounters:  02/04/21 97.7 F (36.5 C) (Oral)  12/20/20 98.4 F (36.9 C) (Oral)  10/09/20 98.8 F (37.1 C) (Oral)   BP Readings from Last 3 Encounters:  02/04/21 124/72  12/20/20 124/64  10/09/20 (!) 149/82   Pulse Readings from Last 3  Encounters:  02/04/21 71  12/20/20 67  10/09/20 83    Physical Exam Vitals and nursing note reviewed.  Constitutional:      Appearance: Normal appearance. She is well-developed and well-groomed. She is obese.  HENT:     Head: Normocephalic and atraumatic.  Eyes:     Conjunctiva/sclera: Conjunctivae normal.     Pupils: Pupils are equal, round, and reactive to light.  Cardiovascular:     Rate and Rhythm: Normal rate and regular rhythm.     Heart sounds: Normal heart sounds. No murmur heard. Pulmonary:     Effort: Pulmonary effort is normal.     Breath sounds: Normal breath sounds.  Skin:    General: Skin is warm and dry.  Neurological:     General: No focal deficit present.     Mental Status: She is alert and oriented to person, place, and time. Mental status is at baseline.     Gait: Gait normal.  Psychiatric:        Attention and Perception: Attention and perception normal.        Mood and Affect: Mood and affect normal.        Speech: Speech normal.        Behavior: Behavior normal. Behavior is cooperative.        Thought Content: Thought content normal.        Cognition and Memory: Cognition and memory normal.        Judgment: Judgment normal.    Assessment  Plan  Chronic sinusitis, unspecified location - Plan: MR BRAIN/IAC W WO CONTRAST PND (post-nasal drip) - Plan: MR BRAIN/IAC W WO  CONTRAST Right ear pain - Plan: MR BRAIN/IAC W WO CONTRAST Chronic intractable headache, with h/o breast cancer - Plan: MR BRAIN/IAC W WO CONTRAST, Sedimentation rate, C-reactive protein Consider neurology/ent in future if imaging negative   Stenosis of left carotid artery - Plan: US Carotid Duplex Bilateral  History of breast cancer - Plan: MR BRAIN/IAC W WO CONTRAST Malignant neoplasm of upper-outer quadrant of left breast in female, estrogen receptor negative (HCC) F/u Dr. Johnanna Schneiders   Anemia, unspecified type - Plan: CBC with Differential/Platelet, Iron, TIBC and Ferritin Panel Stage 3a chronic kidney disease ([place]) - Plan: Comprehensive metabolic panel  Hyperlipidemia, unspecified hyperlipidemia type - Plan: Lipid panel  Vitamin D deficiency - Plan: Vitamin D (25 hydroxy)  Cervical arthritis   HM Flu shot utd check date pharmacy [facility] prevnar and pna 23 utd Tdap and shingrix disc in future disc today she thinks she has had covid vx had 3/3 had 4th dose check dose [facility]   S/p left mastectomy 09/25/08 right mammo negative 09/26/18 reordered 2021  sch 09/30/20 c/o bone pain ? If should do bone scan/cancer markers with h/o  Chronic lymphedema left upper arm    Out of age window pap   Colonoscopy had years ago Dr. Tiffany Kocher -02/04/11 sessile tubular adenoma/IH; EGD duodenal polyp 12/24/14 neg path, hiatal hernia Referred [facility] had 06/17/20 tubular adenoma f/u in 5 years    DEXA +osteoporosis try to get prolia approved had 09/2019 2nd dose in 03/2020 due   Hold prolia dose for 09/2020 pt thinks causing rash, joint pain and muscle aches    Dr. Candiss Norse 12/2019 f/u sch seen 09/17/20 f/u 03/20/21    01/18/20 PHq 9 score 6 and GAD 7 score 0  Provider: Dr. Olivia Mackie McLean-Scocuzza-[facility]

## 2021-02-05 LAB — URINALYSIS, ROUTINE W REFLEX MICROSCOPIC
Bacteria, UA: NONE SEEN /HPF
Bilirubin Urine: NEGATIVE
Glucose, UA: NEGATIVE
Hgb urine dipstick: NEGATIVE
Hyaline Cast: NONE SEEN /LPF
Ketones, ur: NEGATIVE
Nitrite: NEGATIVE
Protein, ur: NEGATIVE
RBC / HPF: NONE SEEN /HPF (ref 0–2)
Specific Gravity, Urine: 1.019 (ref 1.001–1.035)
pH: 6 (ref 5.0–8.0)

## 2021-02-07 ENCOUNTER — Ambulatory Visit: Payer: Medicare Other

## 2021-02-13 NOTE — Progress Notes (Signed)
Left message for patient to return call back for lab results.

## 2021-02-17 ENCOUNTER — Ambulatory Visit: Payer: Medicare Other

## 2021-02-27 ENCOUNTER — Ambulatory Visit
Admission: RE | Admit: 2021-02-27 | Discharge: 2021-02-27 | Disposition: A | Discharge status: Home / Self Care | Payer: Medicare Other | Source: Ambulatory Visit | Attending: Internal Medicine | Admitting: Internal Medicine

## 2021-02-27 ENCOUNTER — Other Ambulatory Visit: Payer: Self-pay

## 2021-02-27 DIAGNOSIS — R519 Headache, unspecified: Secondary | ICD-10-CM | POA: Insufficient documentation

## 2021-02-27 DIAGNOSIS — R591 Generalized enlarged lymph nodes: Secondary | ICD-10-CM

## 2021-02-27 DIAGNOSIS — J329 Chronic sinusitis, unspecified: Secondary | ICD-10-CM | POA: Insufficient documentation

## 2021-02-27 DIAGNOSIS — M542 Cervicalgia: Secondary | ICD-10-CM | POA: Insufficient documentation

## 2021-02-27 DIAGNOSIS — H9201 Otalgia, right ear: Secondary | ICD-10-CM | POA: Diagnosis not present

## 2021-02-27 DIAGNOSIS — I6522 Occlusion and stenosis of left carotid artery: Secondary | ICD-10-CM | POA: Diagnosis not present

## 2021-02-27 DIAGNOSIS — R0982 Postnasal drip: Secondary | ICD-10-CM | POA: Diagnosis not present

## 2021-02-27 DIAGNOSIS — Z853 Personal history of malignant neoplasm of breast: Secondary | ICD-10-CM | POA: Diagnosis not present

## 2021-02-27 DIAGNOSIS — G8929 Other chronic pain: Secondary | ICD-10-CM | POA: Diagnosis not present

## 2021-02-27 DIAGNOSIS — I6523 Occlusion and stenosis of bilateral carotid arteries: Secondary | ICD-10-CM | POA: Diagnosis not present

## 2021-02-27 MED ORDER — GADOBUTROL 1 MMOL/ML IV SOLN
7.5000 mL | Freq: Once | INTRAVENOUS | Status: AC | PRN
  Administered 2021-02-27: 7.5 mL via INTRAVENOUS

## 2021-03-01 ENCOUNTER — Other Ambulatory Visit: Payer: Self-pay | Admitting: Family Medicine

## 2021-03-01 DIAGNOSIS — J341 Cyst and mucocele of nose and nasal sinus: Secondary | ICD-10-CM

## 2021-04-03 ENCOUNTER — Ambulatory Visit: Admission: RE | Admit: 2021-04-03 | Payer: Medicare Other | Source: Ambulatory Visit | Admitting: Radiation Oncology

## 2021-04-07 ENCOUNTER — Other Ambulatory Visit: Payer: Self-pay

## 2021-04-07 DIAGNOSIS — C50412 Malignant neoplasm of upper-outer quadrant of left female breast: Secondary | ICD-10-CM

## 2021-04-22 ENCOUNTER — Telehealth: Payer: Self-pay | Admitting: Internal Medicine

## 2021-04-22 NOTE — Telephone Encounter (Signed)
Rejection Reason - Patient did not respond" Maria Patterson said on Apr 22, 2021 4:46 PM  Msg from [place] ent

## 2021-05-06 ENCOUNTER — Ambulatory Visit (INDEPENDENT_AMBULATORY_CARE_PROVIDER_SITE_OTHER): Payer: Medicare Other | Admitting: Internal Medicine

## 2021-05-06 ENCOUNTER — Encounter: Payer: Self-pay | Admitting: Internal Medicine

## 2021-05-06 ENCOUNTER — Other Ambulatory Visit: Payer: Self-pay

## 2021-05-06 VITALS — BP 150/88 | HR 90 | Temp 96.9°F | Ht 62.01 in | Wt 176.0 lb

## 2021-05-06 DIAGNOSIS — J341 Cyst and mucocele of nose and nasal sinus: Secondary | ICD-10-CM | POA: Diagnosis not present

## 2021-05-06 DIAGNOSIS — R002 Palpitations: Secondary | ICD-10-CM

## 2021-05-06 DIAGNOSIS — E785 Hyperlipidemia, unspecified: Secondary | ICD-10-CM | POA: Diagnosis not present

## 2021-05-06 DIAGNOSIS — R030 Elevated blood-pressure reading, without diagnosis of hypertension: Secondary | ICD-10-CM | POA: Diagnosis not present

## 2021-05-06 DIAGNOSIS — H9201 Otalgia, right ear: Secondary | ICD-10-CM

## 2021-05-06 DIAGNOSIS — N1832 Chronic kidney disease, stage 3b: Secondary | ICD-10-CM

## 2021-05-06 MED ORDER — ROSUVASTATIN CALCIUM 20 MG PO TABS: 20.0000 mg | ORAL_TABLET | Freq: Every day | ORAL | 3 refills | Status: DC

## 2021-05-06 NOTE — Patient Instructions (Addendum)
Petra Kuba made or nature wise tumeric with curcumin can help with arthritis  Voltaren gel  Heat   Zevia soda all are clear   Rosuvastatin Tablets What is this medication? ROSUVASTATIN (roe SOO va sta tin) treats high cholesterol and reduces the risk of heart attack and stroke. It works by decreasing bad cholesterol and fats (such as LDL, triglycerides), and increasing good cholesterol (HDL) in your blood. It belongs to a group of medications called statins. Changes to diet and exercise are often combined with this medication. This medicine may be used for other purposes; ask your health care provider or pharmacist if you have questions. COMMON BRAND NAME(S): Crestor What should I tell my care team before I take this medication? They need to know if you have any of these conditions: Diabetes (high blood sugar) If you often drink alcohol Kidney disease Liver disease Muscle cramps, pain Stroke Thyroid disease An unusual or allergic reaction to rosuvastatin, other medications, foods, dyes, or preservatives Pregnant or trying to get pregnant Breast-feeding How should I use this medication? Take this medication by mouth with a glass of water. Follow the directions on the prescription label. You can take it with or without food. If it upsets your stomach, take it with food. Do not cut, crush or chew this medication. Swallow the tablets whole. Take your medication at regular intervals. Do not take it more often than directed. Take antacids that have a combination of aluminum and magnesium hydroxide in them at a different time of day than this medication. Take these products 2 hours AFTER this medication. Talk to your care team about the use of this medication in children. While this medication may be prescribed for children as young as 7 for selected conditions, precautions do apply. Overdosage: If you think you have taken too much of this medicine contact a poison control center or emergency room at  once. NOTE: This medicine is only for you. Do not share this medicine with others. What if I miss a dose? If you miss a dose, take it as soon as you can. If your next dose is to be taken in less than 12 hours, then do not take the missed dose. Take the next dose at your regular time. Do not take double or extra doses. What may interact with this medication? Do not take this medication with any of the following: Supplements like red yeast rice This medication may also interact with the following: Alcohol Antacids containing aluminum hydroxide and magnesium hydroxide Cyclosporine Other medications for high cholesterol Some medications for HIV infection Warfarin This list may not describe all possible interactions. Give your health care provider a list of all the medicines, herbs, non-prescription drugs, or dietary supplements you use. Also tell them if you smoke, drink alcohol, or use illegal drugs. Some items may interact with your medicine. What should I watch for while using this medication? Visit your health care provider for regular checks on your progress. Tell your health care provider if your symptoms do not start to get better or if they get worse. Your health care provider may tell you to stop taking this medication if you develop muscle problems. If your muscle problems do not go away after stopping this medication, contact your health care provider. Do not become pregnant while taking this medication. Women should inform their health care provider if they wish to become pregnant or think they might be pregnant. There is potential for serious harm to an unborn child. Talk to your  health care provider for more information. Do not breast-feed an infant while taking this medication. This medication may increase blood sugar. Ask your health care provider if changes in diet or medications are needed if you have diabetes. If you are going to need surgery or other procedure, tell your health  care provider that you are using this medication. Taking this medication is only part of a total heart healthy program. Your health care provider may give you a special diet to follow. Avoid alcohol. Avoid smoking. Ask your health care provider how much you should exercise. What side effects may I notice from receiving this medication? Side effects that you should report to your care team as soon as possible: Allergic reactions-skin rash, itching, hives, swelling of the face, lips, tongue, or throat High blood sugar (hyperglycemia)-increased thirst or amount of urine, unusual weakness, fatigue, blurry vision Liver injury-right upper belly pain, loss of appetite, nausea, light-colored stool, dark yellow or brown urine, yellowing skin or eyes, unusual weakness, fatigue Muscle injury-unusual weakness, fatigue, muscle pain, dark yellow or brown urine, decrease in amount of urine Redness, blistering, peeling or loosening of the skin, including inside the mouth Side effects that usually do not require medical attention (report to your care team if they continue or are bothersome): Fatigue Headache Nausea Stomach pain This list may not describe all possible side effects. Call your doctor for medical advice about side effects. You may report side effects to FDA at 1-800-FDA-1088. Where should I keep my medication? Keep out of the reach of children and pets. Store between 20 and 25 degrees C (68 and 77 degrees F). Get rid of any unused medication after the expiration date. To get rid of medications that are no longer needed or have expired: Take the medication to a medication take-back program. Check with your pharmacy or law enforcement to find a location. If you cannot return the medication, check the label or package insert to see if the medication should be thrown out in the garbage or flushed down the toilet. If you are not sure, ask your care team. If it is safe to put it in the trash, take the  medication out of the container. Mix the medication with cat litter, dirt, coffee grounds, or other unwanted substance. Seal the mixture in a bag or container. Put it in the trash. NOTE: This sheet is a summary. It may not cover all possible information. If you have questions about this medicine, talk to your doctor, pharmacist, or health care provider.  2022 Elsevier/Gold Standard (2020-08-02 12:30:47)   [facility] [phone]   Cardiology due 05/20/21 call and schedule  [phone] Not available [place] [address]   Kate Sable, MD   Physician  Cardiology   Call kidney doctor and schedule   09/17/2020 Office Visit [facility] [place]   [address]   [place], [place] [address]   [phone]   [address]   [place], [place] [address]   [phone] (Work)   [phone] (Fax)   Stage 3b chronic kidney disease ([place]) (Primary Dx)   Due to see Dr. Johnanna Schneiders 10/03/2021   Arthritis Arthritis is a term that is commonly used to refer to joint pain or joint disease. There are more than 100 types of arthritis. What are the causes? The most common cause of this  condition is wear and tear of a joint. Other causes include: Gout. Inflammation of a joint. An infection of a joint. Sprains and other injuries near the joint. A reaction to medicines or drugs, or an allergic reaction. In some cases, the cause may not be known. What are the signs or symptoms? The main symptom of this condition is pain in the joint during movement. Other symptoms include: Redness, swelling, or stiffness at a joint. Warmth coming from the joint. Fever. Overall feeling of illness. How is this diagnosed? This condition may be diagnosed with a physical exam and tests, including: Blood tests. Urine tests. Imaging tests, such as X-rays, an MRI, or a CT  scan. Sometimes, fluid is removed from a joint for testing. How is this treated? This condition may be treated with: Treatment of the cause, if it is known. Rest. Raising (elevating) the joint. Applying cold or hot packs to the joint. Medicines to improve symptoms and reduce inflammation. Injections of a steroid such as cortisone into the joint to help reduce pain and inflammation. Depending on the cause of your arthritis, you may need to make lifestyle changes to reduce stress on your joint. Changes may include: Exercising more. Losing weight. Follow these instructions at home: Medicines Take over-the-counter and prescription medicines only as told by your health care provider. Do not take aspirin to relieve pain if your health care provider thinks that gout may be causing your pain. Activity Rest your joint if told by your health care provider. Rest is important when your disease is active and your joint feels painful, swollen, or stiff. Avoid activities that make the pain worse. It is important to balance activity with rest. Exercise your joint regularly with range-of-motion exercises as told by your health care provider. Try doing low-impact exercise, such as: Swimming. Water aerobics. Biking. Walking. Managing pain, stiffness, and swelling   If directed, put ice on the joint. Put ice in a plastic bag. Place a towel between your skin and the bag. Leave the ice on for 20 minutes, 2-3 times per day. If your joint is swollen, raise (elevate) it above the level of your heart if directed by your health care provider. If your joint feels stiff in the morning, try taking a warm shower. If directed, apply heat to the affected area as often as told by your health care provider. Use the heat source that your health care provider recommends, such as a moist heat pack or a heating pad. If you have diabetes, do not apply heat without permission from your health care provider. To apply  heat: Place a towel between your skin and the heat source. Leave the heat on for 20-30 minutes. Remove the heat if your skin turns bright red. This is especially important if you are unable to feel pain, heat, or cold. You may have a greater risk of getting burned. General instructions Do not use any products that contain nicotine or tobacco, such as cigarettes, e-cigarettes, and chewing tobacco. If you need help quitting, ask your health care provider. Keep all follow-up visits as told by your health care provider. This is important. Contact a health care provider if: The pain gets worse. You have a fever. Get help right away if: You develop severe joint pain, swelling, or redness. Many joints become painful and swollen. You develop severe back pain. You develop severe weakness in your leg. You cannot control your bladder or bowels. Summary Arthritis is a term that is commonly used to refer to  joint pain or joint disease. There are more than 100 types of arthritis. The most common cause of this condition is wear and tear of a joint. Other causes include gout, inflammation or infection of the joint, sprains, or allergies. Symptoms of this condition include redness, swelling, or stiffness of the joint. Other symptoms include warmth, fever, or feeling ill. This condition is treated with rest, elevation, medicines, and applying cold or hot packs. Follow your health care provider's instructions about medicines, activity, exercises, and other home care treatments. This information is not intended to replace advice given to you by your health care provider. Make sure you discuss any questions you have with your health care provider. Document Revised: 06/13/2018 Document Reviewed: 06/13/2018 Elsevier Patient Education  2022 [employer].

## 2021-05-06 NOTE — Progress Notes (Addendum)
Chief Complaint  Patient presents with   Follow-up   F/u  1. Right ear pain and right cheeck pain also with h/o dental issues no f/u in 1 year  2.  BP elevated today  3. Hld uncontrolled on lipitor 40 qd and zetia 10 mild CAS, c/o intermittent palpitations will have pt f/u with cards  4. Ckd 3b will f/u with Dr. Candiss Norse pt needs to call to fu  5. H/o breast cancer due to fu with onc Finnegan 10/03/21 will call   Review of Systems  Constitutional:  Negative for weight loss.  HENT:  Positive for ear pain. Negative for hearing loss.   Eyes:  Negative for blurred vision.  Respiratory:  Negative for shortness of breath.   Cardiovascular:  Positive for palpitations. Negative for chest pain.  Musculoskeletal:  Positive for joint pain and neck pain.  Skin:  Negative for rash.  Neurological:  Negative for headaches.  Psychiatric/Behavioral:  Negative for depression.   Past Medical History:  Diagnosis Date   Anemia    Breast cancer ([place]) 2018   Left   Breast mass, left 09/22/2016   RECOMMENDATION: Ultrasound-guided core biopsies of masses at left breast 2 o'clock 2 cm from nipple, left breast 2 o'clock 5 cm from nipple, abnormal left axillary lymph nodes.   COPD (chronic obstructive pulmonary disease) (HCC)    Depression    GERD (gastroesophageal reflux disease)    History of chickenpox    Hx of adenomatous colonic polyps    Hyperglycemia    Hyperlipidemia    Obesity    Personal history of chemotherapy 2018   Left breast   Personal history of radiation therapy 2018   Postmenopausal    Shingles    right eye    Past Surgical History:  Procedure Laterality Date   ABDOMINAL HYSTERECTOMY     AXILLARY SENTINEL NODE BIOPSY Left 04/14/2017   Procedure: AXILLARY SENTINEL NODE BIOPSY;  Surgeon: Olean Ree, MD;  Location: [facility] ORS;  Service: General;  Laterality: Left;   BREAST BIOPSY Left 09/30/2016   US biopsy of 3 areas + chemo   BREAST EXCISIONAL BIOPSY Left 04/14/2017   Left breast  invasive cancer left mastectomy   COLONOSCOPY WITH PROPOFOL N/A 06/17/2020   Procedure: COLONOSCOPY WITH PROPOFOL;  Surgeon: Toledo, Benay Pike, MD;  Location: [facility] ENDOSCOPY;  Service: Gastroenterology;  Laterality: N/A;   ESOPHAGOGASTRODUODENOSCOPY (EGD) WITH PROPOFOL N/A 12/24/2014   Procedure: ESOPHAGOGASTRODUODENOSCOPY (EGD) WITH PROPOFOL;  Surgeon: Manya Silvas, MD;  Location: [facility] ENDOSCOPY;  Service: Endoscopy;  Laterality: N/A;   MASTECTOMY Left 04/14/2017   Left breast invasive cancer   PORTACATH PLACEMENT Right 10/27/2016   Procedure: INSERTION PORT-A-CATH;  Surgeon: Nestor Lewandowsky, MD;  Location: [facility] ORS;  Service: General;  Laterality: Right;   TONSILECTOMY, ADENOIDECTOMY, BILATERAL MYRINGOTOMY AND TUBES     TONSILLECTOMY     TOTAL MASTECTOMY Left 04/14/2017   Procedure: TOTAL MASTECTOMY;  Surgeon: Olean Ree, MD;  Location: [facility] ORS;  Service: General;  Laterality: Left;   Family History  Problem Relation Age of Onset   Stroke Mother    Stroke Father    Cancer Sister        with mets as of 09/18/20   Cancer Daughter        as of 05/06/21 dx ? cancer type 46   Depression Neg Hx    Breast cancer Neg Hx    Social History   Socioeconomic History   Marital status: Widowed    Spouse  name: Not on file   Number of children: 1   Years of education: GED   Highest education level: 12th grade  Occupational History   Occupation: Retired  Tobacco Use   Smoking status: Never   Smokeless tobacco: Never   Tobacco comments:    smoking cessation materials not required  Vaping Use   Vaping Use: Never used  Substance and Sexual Activity   Alcohol use: No    Alcohol/week: 0.0 standard drinks   Drug use: No   Sexual activity: Not Currently  Other Topics Concern   Not on file  Social History Narrative   Lives at home    1 kid [name] (daughter) [phone]   Social Determinants of Health   Financial Resource Strain: Not on file  Food Insecurity: Not on file   Transportation Needs: Not on file  Physical Activity: Not on file  Stress: Not on file  Social Connections: Not on file  Intimate Partner Violence: Not on file   Current Meds  Medication Sig   acetaminophen (TYLENOL) 325 MG tablet Take 2 tablets (650 mg total) by mouth every 6 (six) hours as needed.   aspirin 81 MG tablet Take 81 mg by mouth daily.   Cholecalciferol (VITAMIN D) 2000 units tablet Take 2,000 Units by mouth 3 (three) times a week.    ezetimibe (ZETIA) 10 MG tablet Take 1 tablet (10 mg total) by mouth daily.   ferrous sulfate 325 (65 FE) MG EC tablet Take 325 mg by mouth 3 (three) times a week.    fluticasone (FLONASE) 50 MCG/ACT nasal spray Place 2 sprays into both nostrils daily as needed for allergies or rhinitis.   hydrocortisone 2.5 % lotion Apply topically 2 (two) times daily. Prn   levocetirizine (XYZAL) 5 MG tablet Take 0.5 tablets (2.5 mg total) by mouth every evening.   Multiple Vitamin (MULTIVITAMIN WITH MINERALS) TABS tablet Take 1 tablet by mouth 3 (three) times a week.    pantoprazole (PROTONIX) 40 MG tablet Take 1 tablet (40 mg total) by mouth daily. 30 minutes before food   Polyethyl Glycol-Propyl Glycol (SYSTANE OP) Place 1 drop into both eyes daily as needed (dry eyes).    rosuvastatin (CRESTOR) 20 MG tablet Take 1 tablet (20 mg total) by mouth daily. D/c lipitor 40   sodium chloride (OCEAN) 0.65 % SOLN nasal spray Place 1 spray into both nostrils as needed for congestion.   Allergies  Allergen Reactions   Latex Itching   Penicillins Itching and Rash    Has patient had a PCN reaction causing immediate rash, facial/tongue/throat swelling, SOB or lightheadedness with hypotension: Yes Has patient had a PCN reaction causing severe rash involving mucus membranes or skin necrosis: No Has patient had a PCN reaction that required hospitalization No Has patient had a PCN reaction occurring within the last 10 years: No If all of the above answers are "NO", then  may proceed with Cephalosporin use.    No results found for this or any previous visit (from the past 2160 hour(s)). Objective  Body mass index is 32.18 kg/m. Wt Readings from Last 3 Encounters:  05/06/21 176 lb (79.8 kg)  02/04/21 176 lb 3.2 oz (79.9 kg)  12/20/20 174 lb (78.9 kg)   Temp Readings from Last 3 Encounters:  05/06/21 (!) 96.9 F (36.1 C)  02/04/21 97.7 F (36.5 C) (Oral)  12/20/20 98.4 F (36.9 C) (Oral)   BP Readings from Last 3 Encounters:  05/06/21 (!) 150/86  02/04/21  124/72  12/20/20 124/64   Pulse Readings from Last 3 Encounters:  05/06/21 90  02/04/21 71  12/20/20 67    Physical Exam Vitals and nursing note reviewed.  Constitutional:      Appearance: Normal appearance. She is well-developed and well-groomed.  HENT:     Head: Normocephalic and atraumatic.  Eyes:     Conjunctiva/sclera: Conjunctivae normal.     Pupils: Pupils are equal, round, and reactive to light.  Cardiovascular:     Rate and Rhythm: Normal rate and regular rhythm.     Heart sounds: Normal heart sounds. No murmur heard. Pulmonary:     Effort: Pulmonary effort is normal.     Breath sounds: Normal breath sounds.  Skin:    General: Skin is warm and dry.  Neurological:     General: No focal deficit present.     Mental Status: She is alert and oriented to person, place, and time. Mental status is at baseline.     Gait: Gait normal.  Psychiatric:        Attention and Perception: Attention and perception normal.        Mood and Affect: Mood and affect normal.        Speech: Speech normal.        Behavior: Behavior normal. Behavior is cooperative.        Thought Content: Thought content normal.        Cognition and Memory: Cognition and memory normal.        Judgment: Judgment normal.    Assessment  Plan  Right ear pain - Plan: Ambulatory referral to ENT Maxillary sinus cyst - Plan: Ambulatory referral to ENT  Palpitations Hyperlipidemia, unspecified hyperlipidemia  type - Plan: rosuvastatin (CRESTOR) 20 MG tablet change from lipitor 40 cont zetia  F/u cards sent Dr Charlaine Dalton a message  Elevated blood pressure reading See above BP check in 1 week RN visit  Stage 3b chronic kidney disease ([place])  F/u Dr. Candiss Norse  HM Flu shot utd 03/28/21 prevnar and pna 23 utd Tdap and shingrix disc in future disc today she thinks she has had covid vx had 4/4   S/p left mastectomy 09/25/08 right mammo negative 09/26/18 reordered 2021  sch 09/30/20 c/o bone pain ? If should do bone scan/cancer markers with h/o  Chronic lymphedema left upper arm  F/u due Finnegan in 10/03/2021    Out of age window pap   Colonoscopy had years ago Dr. Tiffany Kocher -02/04/11 sessile tubular adenoma/IH; EGD duodenal polyp 12/24/14 neg path, hiatal hernia Referred [facility] had 06/17/20 tubular adenoma f/u in 5 years    DEXA +osteoporosis try to get prolia approved had 09/2019 2nd dose in 03/2020 due   Started prolia pt thinks causing rash, joint pain and muscle aches    Dr. Candiss Norse 12/2019 f/u sch seen 09/17/20 f/u 03/20/21 as of 05/06/21 due for f/u     Provider: Dr. Olivia Mackie McLean-Scocuzza-Internal Medicine

## 2021-05-08 DIAGNOSIS — N1831 Chronic kidney disease, stage 3a: Secondary | ICD-10-CM | POA: Diagnosis not present

## 2021-05-08 DIAGNOSIS — N1832 Chronic kidney disease, stage 3b: Secondary | ICD-10-CM | POA: Diagnosis not present

## 2021-05-08 DIAGNOSIS — I10 Essential (primary) hypertension: Secondary | ICD-10-CM | POA: Diagnosis not present

## 2021-05-08 DIAGNOSIS — R82998 Other abnormal findings in urine: Secondary | ICD-10-CM | POA: Diagnosis not present

## 2021-05-12 NOTE — Progress Notes (Signed)
Attempted to schedule.  LMOV to call office.  ° °

## 2021-05-13 ENCOUNTER — Ambulatory Visit: Payer: Medicare Other

## 2021-05-14 ENCOUNTER — Other Ambulatory Visit: Payer: Self-pay

## 2021-05-14 ENCOUNTER — Ambulatory Visit (INDEPENDENT_AMBULATORY_CARE_PROVIDER_SITE_OTHER): Payer: Medicare Other

## 2021-05-14 VITALS — BP 138/76 | HR 79 | Wt 175.5 lb

## 2021-05-14 DIAGNOSIS — Z0130 Encounter for examination of blood pressure without abnormal findings: Secondary | ICD-10-CM

## 2021-05-14 NOTE — Progress Notes (Addendum)
Patient here for nurse visit BP check per order from [place]. BP Order is in the Instructions of the 05/06/21 office visit.  Patient reports compliance with prescribed BP medications: Pt is not on BP medication  BP Readings from Last 3 Encounters:  05/14/21 138/76  05/06/21 (!) 150/88  02/04/21 124/72   Pulse Readings from Last 3 Encounters:  05/14/21 79  05/06/21 90  02/04/21 71    Any Headaches? Dizziness? Light Headedness? Pt states that she is having dizziness and headaches but blames that on her Sinuses. Pt is prescribed Xyzal.   Patient brought in a BP log, highest reading was 172/82. Pt believes it to be from her recent diet as she has been eating a lot of TV dinners and did not know they contained so much sodium. Pt states that she will be more conscious of her food intake and will change her diet. BP log has been given to PCP for review.   Patients first blood pressure read 140/70. Pt waited 10 minutes to have a bp recheck. Recheck read 138/76. [place], CMA

## 2021-05-15 DIAGNOSIS — N281 Cyst of kidney, acquired: Secondary | ICD-10-CM | POA: Diagnosis not present

## 2021-05-15 DIAGNOSIS — Q6102 Congenital multiple renal cysts: Secondary | ICD-10-CM | POA: Insufficient documentation

## 2021-05-15 DIAGNOSIS — N1832 Chronic kidney disease, stage 3b: Secondary | ICD-10-CM | POA: Diagnosis not present

## 2021-05-15 HISTORY — DX: Congenital multiple renal cysts: Q61.02

## 2021-05-19 ENCOUNTER — Ambulatory Visit: Payer: Medicare Other | Admitting: Cardiology

## 2021-05-19 ENCOUNTER — Other Ambulatory Visit: Payer: Self-pay

## 2021-05-19 ENCOUNTER — Encounter: Payer: Self-pay | Admitting: Cardiology

## 2021-05-19 ENCOUNTER — Telehealth: Payer: Self-pay | Admitting: Internal Medicine

## 2021-05-19 VITALS — BP 130/70 | HR 72 | Ht 62.0 in | Wt 177.2 lb

## 2021-05-19 DIAGNOSIS — I2584 Coronary atherosclerosis due to calcified coronary lesion: Secondary | ICD-10-CM | POA: Diagnosis not present

## 2021-05-19 DIAGNOSIS — I10 Essential (primary) hypertension: Secondary | ICD-10-CM

## 2021-05-19 DIAGNOSIS — E7800 Pure hypercholesterolemia, unspecified: Secondary | ICD-10-CM | POA: Diagnosis not present

## 2021-05-19 DIAGNOSIS — I2510 Atherosclerotic heart disease of native coronary artery without angina pectoris: Secondary | ICD-10-CM

## 2021-05-19 NOTE — Telephone Encounter (Signed)
At blood pressure check appt 1-2 weeks ago BP high and at home readings  BP high and home readings variable 197/90, 172 sbp, 152, 148W-039J -bystolic 2.5 mg daily with h/o plaque build up (CAD) on CT chest 2020  Or  Recommendation by cards   Will reach out to patient to see if agreeable to treatment and cardiology follow up She has appt today 05/19/21 with cards     Dr. Olivia Mackie McLean-Scocuzza

## 2021-05-19 NOTE — Patient Instructions (Signed)
Medication Instructions:   Your physician recommends that you continue on your current medications as directed. Please refer to the Current Medication list given to you today.  *If you need a refill on your cardiac medications before your next appointment, please call your pharmacy*   Lab Work:  Your physician recommends that you return for lab work Prior to your 3 month follow up. You will need to be fasting.   If you have labs (blood work) drawn today and your tests are completely normal, you will receive your results only by: [place] (if you have MyChart) OR A paper copy in the mail If you have any lab test that is abnormal or we need to change your treatment, we will call you to review the results.   Testing/Procedures:  None Ordered  Follow-Up: At [facility], you and your health needs are our priority.  As part of our continuing mission to provide you with exceptional heart care, we have created designated Provider Care Teams.  These Care Teams include your primary Cardiologist (physician) and Advanced Practice Providers (APPs -  Physician Assistants and Nurse Practitioners) who all work together to provide you with the care you need, when you need it.  We recommend signing up for the patient portal called "MyChart".  Sign up information is provided on this After Visit Summary.  MyChart is used to connect with patients for Virtual Visits (Telemedicine).  Patients are able to view lab/test results, encounter notes, upcoming appointments, etc.  Non-urgent messages can be sent to your provider as well.   To learn more about what you can do with MyChart, go to [email or web address].    Your next appointment:   3 month(s)  The format for your next appointment:   In Person  Provider:   You may see Kate Sable, MD or one of the following Advanced Practice Providers on your designated Care Team:   Murray Hodgkins, NP Christell Faith, PA-C Marrianne Mood,  PA-C Cadence Byron, [place]

## 2021-05-19 NOTE — Telephone Encounter (Signed)
Patient calling back in. Patient informed and verbalized understanding.    Patient states she does not currently see cardiology or does not remember seeing cardiology. Patient state she is agreeable to Cardiology referral.   Patient states she would like to try the Bystolic as well to get her blood pressure down while she is waiting to get an appointment with Cardiology.

## 2021-05-19 NOTE — Telephone Encounter (Signed)
She just had cardiology appt today Dr. Kate Sable  Tomorrow fax him her BP log please thanks  Have pt continue to monitor BP

## 2021-05-19 NOTE — Progress Notes (Signed)
Cardiology Office Note:    Date:  05/19/2021   ID:  Maria Patterson, DOB [birth year], MRN [id number]  PCP:  McLean-Scocuzza, Nino Glow, MD  Cardiologist:  Kate Sable, MD  Electrophysiologist:  None   Referring MD: McLean-Scocuzza, Maria Patterson *   Chief Complaint  Patient presents with   Other    Palpitations c/o elevated BP. Meds reviewed verbally with pt.    History of Present Illness:    Maria Patterson is a 80 y.o. female with a hx of hyperlipidemia, coronary artery calcification, GERD who presents for follow-up due to elevated blood pressures.    Patient states her blood pressures have been elevated over the past month up to [id number] systolic.  Denies chest pain, shortness of breath, palpitations.  She endorses eating frozen dinners over the past several weeks, she cut back on the frozen dinners with improvement in her blood pressures.  Currently in the 130s after cutting back on frozen foods.  Started on Crestor 20 mg daily 2 weeks ago.     Prior notes/studies Chest CT 09/2018 coronary artery calcification, aortic calcification.   Past Medical History:  Diagnosis Date   Anemia    Breast cancer ([place]) 2018   Left   Breast mass, left 09/22/2016   RECOMMENDATION: Ultrasound-guided core biopsies of masses at left breast 2 o'clock 2 cm from nipple, left breast 2 o'clock 5 cm from nipple, abnormal left axillary lymph nodes.   COPD (chronic obstructive pulmonary disease) (HCC)    Depression    GERD (gastroesophageal reflux disease)    History of chickenpox    Hx of adenomatous colonic polyps    Hyperglycemia    Hyperlipidemia    Obesity    Personal history of chemotherapy 2018   Left breast   Personal history of radiation therapy 2018   Postmenopausal    Shingles    right eye     Past Surgical History:  Procedure Laterality Date   ABDOMINAL HYSTERECTOMY     AXILLARY SENTINEL NODE BIOPSY Left 04/14/2017   Procedure: AXILLARY SENTINEL NODE BIOPSY;  Surgeon: Olean Ree,  MD;  Location: [facility] ORS;  Service: General;  Laterality: Left;   BREAST BIOPSY Left 09/30/2016   US biopsy of 3 areas + chemo   BREAST EXCISIONAL BIOPSY Left 04/14/2017   Left breast invasive cancer left mastectomy   COLONOSCOPY WITH PROPOFOL N/A 06/17/2020   Procedure: COLONOSCOPY WITH PROPOFOL;  Surgeon: Toledo, Benay Pike, MD;  Location: [facility] ENDOSCOPY;  Service: Gastroenterology;  Laterality: N/A;   ESOPHAGOGASTRODUODENOSCOPY (EGD) WITH PROPOFOL N/A 12/24/2014   Procedure: ESOPHAGOGASTRODUODENOSCOPY (EGD) WITH PROPOFOL;  Surgeon: Manya Silvas, MD;  [facility] ENDOSCOPY;  Service: Endoscopy;  Laterality: N/A;   MASTECTOMY Left 04/14/2017   Left breast invasive cancer   PORTACATH PLACEMENT Right 10/27/2016   Procedure: INSERTION PORT-A-CATH;  Surgeon: Nestor Lewandowsky, MD;  Location: [facility] ORS;  Service: General;  Laterality: Right;   TONSILECTOMY, ADENOIDECTOMY, BILATERAL MYRINGOTOMY AND TUBES     TONSILLECTOMY     TOTAL MASTECTOMY Left 04/14/2017   Procedure: TOTAL MASTECTOMY;  Surgeon: Olean Ree, MD;  Location: [facility] ORS;  Service: General;  Laterality: Left;    Current Medications: Current Meds  Medication Sig   acetaminophen (TYLENOL) 325 MG tablet Take 2 tablets (650 mg total) by mouth every 6 (six) hours as needed.   aspirin 81 MG tablet Take 81 mg by mouth. Takes every 3-4 days.   Cholecalciferol (VITAMIN D) 2000 units tablet Take 2,000 Units by mouth 3 (three) times a  week.    ezetimibe (ZETIA) 10 MG tablet Take 1 tablet (10 mg total) by mouth daily.   ferrous sulfate 325 (65 FE) MG EC tablet Take 325 mg by mouth 3 (three) times a week.    fluticasone (FLONASE) 50 MCG/ACT nasal spray Place 2 sprays into both nostrils daily as needed for allergies or rhinitis.   hydrocortisone 2.5 % lotion Apply topically 2 (two) times daily. Prn   levocetirizine (XYZAL) 5 MG tablet Take 0.5 tablets (2.5 mg total) by mouth every evening.   Multiple Vitamin (MULTIVITAMIN WITH MINERALS) TABS  tablet Take 1 tablet by mouth 3 (three) times a week.    pantoprazole (PROTONIX) 40 MG tablet Take 1 tablet (40 mg total) by mouth daily. 30 minutes before food   Polyethyl Glycol-Propyl Glycol (SYSTANE OP) Place 1 drop into both eyes daily as needed (dry eyes).    rosuvastatin (CRESTOR) 20 MG tablet Take 1 tablet (20 mg total) by mouth daily. D/c lipitor 40   sodium chloride (OCEAN) 0.65 % SOLN nasal spray Place 1 spray into both nostrils as needed for congestion.      Allergies:   Latex and Penicillins   Social History   Socioeconomic History   Marital status: Widowed    Spouse name: Not on file   Number of children: 1   Years of education: GED   Highest education level: 12th grade  Occupational History   Occupation: Retired  Tobacco Use   Smoking status: Never   Smokeless tobacco: Never   Tobacco comments:    smoking cessation materials not required  Vaping Use   Vaping Use: Never used  Substance and Sexual Activity   Alcohol use: No    Alcohol/week: 0.0 standard drinks   Drug use: No   Sexual activity: Not Currently  Other Topics Concern   Not on file  Social History Narrative   Lives at home    1 kid Maria Patterson (daughter) [phone]   Social Determinants of Health   Financial Resource Strain: Not on file  Food Insecurity: Not on file  Transportation Needs: Not on file  Physical Activity: Not on file  Stress: Not on file  Social Connections: Not on file     Family History: The patient's family history includes Cancer in her daughter and sister; Stroke in her father and mother. There is no history of Depression or Breast cancer.  ROS:   Please see the history of present illness.     All other systems reviewed and are negative.  EKGs/Labs/Other Studies Reviewed:    The following studies were reviewed today:  EKG:  EKG is  ordered today.  The ekg ordered today demonstrates normal sinus rhythm.  Recent Labs: 02/04/2021: ALT 17; BUN 28; Creatinine, Ser  1.35; Hemoglobin 12.3; Platelets 197.0; Potassium 4.9; Sodium 143; TSH 1.15  Recent Lipid Panel    Component Value Date/Time   CHOL 203 (H) 02/04/2021 0924   CHOL 250 (H) 11/17/2019 0918   TRIG 80.0 02/04/2021 0924   HDL 67.00 02/04/2021 0924   HDL 66 11/17/2019 0918   CHOLHDL 3 02/04/2021 0924   VLDL 16.0 02/04/2021 0924   LDLCALC 120 (H) 02/04/2021 0924   LDLCALC 159 (H) 11/17/2019 0918   LDLCALC 197 (H) 02/17/2019 1618    Physical Exam:    VS:  BP 130/70 (BP Location: Left Arm, Patient Position: Sitting, Cuff Size: Normal)   Pulse 72   Ht 5\' 2"  (1.575 m)   Wt 177 lb 4  oz (80.4 kg)   SpO2 97%   BMI 32.42 kg/m     Wt Readings from Last 3 Encounters:  05/19/21 177 lb 4 oz (80.4 kg)  05/14/21 175 lb 8 oz (79.6 kg)  05/06/21 176 lb (79.8 kg)     GEN:  Well nourished, well developed in no acute distress HEENT: Normal NECK: No JVD; No carotid bruits LYMPHATICS: No lymphadenopathy CARDIAC: RRR, no murmurs, rubs, gallops RESPIRATORY:  Clear to auscultation without rales, wheezing or rhonchi  ABDOMEN: Soft, non-tender, non-distended MUSCULOSKELETAL:  No edema in lower extremities; No deformity, compression stockings noted in left upper extremity. SKIN: Warm and dry NEUROLOGIC:  Alert and oriented x 3 PSYCHIATRIC:  Normal affect   ASSESSMENT:    1. Pure hypercholesterolemia   2. Primary hypertension   3. Coronary artery calcification     PLAN:    In order of problems listed above:  Hyperlipidemia, continue Zetia, Crestor.  Repeat fasting lipid profile in 3 months. Hypertension, likely due to elevated salt diet.  BP improved after cutting back on frozen foods.  Continue to monitor patient off BP meds for now.  Consider BP if BP stays elevated. Coronary artery calcification, continue aspirin 81 mg, Crestor.  Denies chest pain.  Follow-up in 3 months.   Medication Adjustments/Labs and Tests Ordered: Current medicines are reviewed at length with the patient today.   Concerns regarding medicines are outlined above.  Orders Placed This Encounter  Procedures   Lipid panel   EKG 12-Lead    No orders of the defined types were placed in this encounter.   Patient Instructions  Medication Instructions:   Your physician recommends that you continue on your current medications as directed. Please refer to the Current Medication list given to you today.  *If you need a refill on your cardiac medications before your next appointment, please call your pharmacy*   Lab Work:  Your physician recommends that you return for lab work Prior to your 3 month follow up. You will need to be fasting.   If you have labs (blood work) drawn today and your tests are completely normal, you will receive your results only by: [place] (if you have MyChart) OR A paper copy in the mail If you have any lab test that is abnormal or we need to change your treatment, we will call you to review the results.   Testing/Procedures:  None Ordered  Follow-Up: At [facility], you and your health needs are our priority.  As part of our continuing mission to provide you with exceptional heart care, we have created designated Provider Care Teams.  These Care Teams include your primary Cardiologist (physician) and Advanced Practice Providers (APPs -  Physician Assistants and Nurse Practitioners) who all work together to provide you with the care you need, when you need it.  We recommend signing up for the patient portal called "MyChart".  Sign up information is provided on this After Visit Summary.  MyChart is used to connect with patients for Virtual Visits (Telemedicine).  Patients are able to view lab/test results, encounter notes, upcoming appointments, etc.  Non-urgent messages can be sent to your provider as well.   To learn more about what you can do with MyChart, go to [email or web address].    Your next appointment:   3 month(s)  The format for your next  appointment:   In Person  Provider:   You may see Kate Sable, MD or one of the following Advanced Practice Providers on  your designated Care Team:   Murray Hodgkins, NP Christell Faith, PA-C Marrianne Mood, PA-C Cadence Osakis, [place]     Signed, Kate Sable, MD  05/19/2021 12:35 PM    [place]

## 2021-05-19 NOTE — Telephone Encounter (Signed)
Called and Patient's daughter answered the phone. She states that this will need to be discussed with her mother and she will have her mother give [place] a call back.

## 2021-05-20 NOTE — Telephone Encounter (Signed)
Patient went to pharmacy to get BP Medication. However, her Bystolic was not sent to the pharmacy.

## 2021-05-20 NOTE — Telephone Encounter (Signed)
Please advise, will Patient need to then discuss new medication with Cardiology?

## 2021-05-26 ENCOUNTER — Other Ambulatory Visit: Payer: Self-pay | Admitting: Internal Medicine

## 2021-05-26 DIAGNOSIS — I10 Essential (primary) hypertension: Secondary | ICD-10-CM

## 2021-05-26 DIAGNOSIS — I2510 Atherosclerotic heart disease of native coronary artery without angina pectoris: Secondary | ICD-10-CM

## 2021-05-26 MED ORDER — NEBIVOLOL HCL 2.5 MG PO TABS: 2.5000 mg | ORAL_TABLET | Freq: Every day | ORAL | 3 refills | Status: DC

## 2021-05-26 NOTE — Telephone Encounter (Signed)
Due to BP multiple times being elevated including home readings add bystolic 2.5 mg daily  Sent to [facility]  Have pt monitor BP on medication and bring in log in 2 weeks on piece of paper what her BP is doing on the medication please BP and heart rate/pulse

## 2021-05-26 NOTE — Telephone Encounter (Signed)
Patient informed and verbalized understanding.  Patient will pick up and start medication. Patient will document blood pressure daily for 2 weeks and then drop this off in office.

## 2021-06-17 ENCOUNTER — Telehealth: Payer: Self-pay | Admitting: Internal Medicine

## 2021-06-17 NOTE — Telephone Encounter (Signed)
Rejection Reason - Patient did not respond - LVM 05/07/21" Maria Patterson said on Jun 17, 2021 9:57 AM  Msg from [place] [facility]

## 2021-08-06 ENCOUNTER — Ambulatory Visit (INDEPENDENT_AMBULATORY_CARE_PROVIDER_SITE_OTHER): Payer: Medicare Other | Admitting: Internal Medicine

## 2021-08-06 ENCOUNTER — Other Ambulatory Visit: Payer: Self-pay

## 2021-08-06 ENCOUNTER — Encounter: Payer: Self-pay | Admitting: Internal Medicine

## 2021-08-06 VITALS — BP 126/68 | HR 62 | Temp 97.2°F | Ht 62.0 in | Wt 177.4 lb

## 2021-08-06 DIAGNOSIS — K219 Gastro-esophageal reflux disease without esophagitis: Secondary | ICD-10-CM

## 2021-08-06 DIAGNOSIS — I10 Essential (primary) hypertension: Secondary | ICD-10-CM

## 2021-08-06 DIAGNOSIS — J3089 Other allergic rhinitis: Secondary | ICD-10-CM | POA: Diagnosis not present

## 2021-08-06 DIAGNOSIS — E785 Hyperlipidemia, unspecified: Secondary | ICD-10-CM

## 2021-08-06 DIAGNOSIS — H6121 Impacted cerumen, right ear: Secondary | ICD-10-CM

## 2021-08-06 DIAGNOSIS — J029 Acute pharyngitis, unspecified: Secondary | ICD-10-CM | POA: Diagnosis not present

## 2021-08-06 DIAGNOSIS — L309 Dermatitis, unspecified: Secondary | ICD-10-CM

## 2021-08-06 DIAGNOSIS — J309 Allergic rhinitis, unspecified: Secondary | ICD-10-CM

## 2021-08-06 DIAGNOSIS — H9201 Otalgia, right ear: Secondary | ICD-10-CM

## 2021-08-06 MED ORDER — NEOMYCIN-POLYMYXIN-HC 3.5-10000-1 OT SOLN: 4.0000 [drp] | Freq: Three times a day (TID) | OTIC | 0 refills | Status: DC

## 2021-08-06 MED ORDER — CARBAMIDE PEROXIDE 6.5 % OT SOLN: 5.0000 [drp] | Freq: Two times a day (BID) | OTIC | 0 refills | Status: DC

## 2021-08-06 MED ORDER — SALINE SPRAY 0.65 % NA SOLN: 1.0000 | NASAL | 11 refills | Status: DC | PRN

## 2021-08-06 MED ORDER — PANTOPRAZOLE SODIUM 40 MG PO TBEC: 40.0000 mg | DELAYED_RELEASE_TABLET | Freq: Every day | ORAL | 3 refills | Status: DC

## 2021-08-06 MED ORDER — LEVOCETIRIZINE DIHYDROCHLORIDE 5 MG PO TABS: 2.5000 mg | ORAL_TABLET | Freq: Every evening | ORAL | 3 refills | Status: DC

## 2021-08-06 MED ORDER — EZETIMIBE 10 MG PO TABS: 10.0000 mg | ORAL_TABLET | Freq: Every day | ORAL | 3 refills | Status: DC

## 2021-08-06 MED ORDER — FLUTICASONE PROPIONATE 50 MCG/ACT NA SUSP: 2.0000 | Freq: Every day | NASAL | 11 refills | Status: DC | PRN

## 2021-08-06 NOTE — Patient Instructions (Signed)
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

## 2021-08-06 NOTE — Progress Notes (Signed)
Chief Complaint  Patient presents with   Follow-up   F/u  1. Htn improved on bystolic 2.5 mg qd no spikes and HR controlled 2. C/o right ear pain + wax in right ear    Review of Systems  Constitutional:  Negative for weight loss.  HENT:  Positive for ear pain. Negative for hearing loss.   Eyes:  Negative for blurred vision.  Respiratory:  Negative for shortness of breath.   Cardiovascular:  Negative for chest pain.  Gastrointestinal:  Negative for abdominal pain and blood in stool.  Genitourinary:  Negative for dysuria.  Musculoskeletal:  Negative for falls and joint pain.  Skin:  Negative for rash.  Neurological:  Negative for headaches.  Psychiatric/Behavioral:  Negative for depression.   Past Medical History:  Diagnosis Date   Anemia    Breast cancer ([place]) 2018   Left   Breast mass, left 09/22/2016   RECOMMENDATION: Ultrasound-guided core biopsies of masses at left breast 2 o'clock 2 cm from nipple, left breast 2 o'clock 5 cm from nipple, abnormal left axillary lymph nodes.   COPD (chronic obstructive pulmonary disease) (HCC)    Depression    GERD (gastroesophageal reflux disease)    History of chickenpox    Hx of adenomatous colonic polyps    Hyperglycemia    Hyperlipidemia    Obesity    Personal history of chemotherapy 2018   Left breast   Personal history of radiation therapy 2018   Postmenopausal    Shingles    right eye    Past Surgical History:  Procedure Laterality Date   ABDOMINAL HYSTERECTOMY     AXILLARY SENTINEL NODE BIOPSY Left 04/14/2017   Procedure: AXILLARY SENTINEL NODE BIOPSY;  Surgeon: Olean Ree, MD;  Location: [facility] ORS;  Service: General;  Laterality: Left;   BREAST BIOPSY Left 09/30/2016   US biopsy of 3 areas + chemo   BREAST EXCISIONAL BIOPSY Left 04/14/2017   Left breast invasive cancer left mastectomy   COLONOSCOPY WITH PROPOFOL N/A 06/17/2020   Procedure: COLONOSCOPY WITH PROPOFOL;  Surgeon: Toledo, Benay Pike, MD;  Location: [facility]  ENDOSCOPY;  Service: Gastroenterology;  Laterality: N/A;   ESOPHAGOGASTRODUODENOSCOPY (EGD) WITH PROPOFOL N/A 12/24/2014   Procedure: ESOPHAGOGASTRODUODENOSCOPY (EGD) WITH PROPOFOL;  Surgeon: Manya Silvas, MD;  Location: [facility] ENDOSCOPY;  Service: Endoscopy;  Laterality: N/A;   MASTECTOMY Left 04/14/2017   Left breast invasive cancer   PORTACATH PLACEMENT Right 10/27/2016   Procedure: INSERTION PORT-A-CATH;  Surgeon: Nestor Lewandowsky, MD;  Location: [facility] ORS;  Service: General;  Laterality: Right;   TONSILECTOMY, ADENOIDECTOMY, BILATERAL MYRINGOTOMY AND TUBES     TONSILLECTOMY     TOTAL MASTECTOMY Left 04/14/2017   Procedure: TOTAL MASTECTOMY;  Surgeon: Olean Ree, MD;  Location: [facility] ORS;  Service: General;  Laterality: Left;   Family History  Problem Relation Age of Onset   Stroke Mother    Stroke Father    Cancer Sister        with mets as of 09/18/20   Cancer Daughter        as of 05/06/21 dx ? cancer type 40   Depression Neg Hx    Breast cancer Neg Hx    Social History   Socioeconomic History   Marital status: Widowed    Spouse name: Not on file   Number of children: 1   Years of education: GED   Highest education level: 12th grade  Occupational History   Occupation: Retired  Tobacco Use   Smoking status: Never  Smokeless tobacco: Never   Tobacco comments:    smoking cessation materials not required  Vaping Use   Vaping Use: Never used  Substance and Sexual Activity   Alcohol use: No    Alcohol/week: 0.0 standard drinks   Drug use: No   Sexual activity: Not Currently  Other Topics Concern   Not on file  Social History Narrative   Lives at home    1 kid [name] (daughter) [phone]   Social Determinants of Health   Financial Resource Strain: Not on file  Food Insecurity: Not on file  Transportation Needs: Not on file  Physical Activity: Not on file  Stress: Not on file  Social Connections: Not on file  Intimate Partner Violence: Not on file    Current Meds  Medication Sig   acetaminophen (TYLENOL) 325 MG tablet Take 2 tablets (650 mg total) by mouth every 6 (six) hours as needed.   aspirin 81 MG tablet Take 81 mg by mouth. Takes every 3-4 days.   carbamide peroxide (DEBROX) 6.5 % OTIC solution Place 5 drops into the right ear 2 (two) times daily. X 4-7 days as needed right ear wax   Cholecalciferol (VITAMIN D) 2000 units tablet Take 2,000 Units by mouth 3 (three) times a week.    ferrous sulfate 325 (65 FE) MG EC tablet Take 325 mg by mouth 3 (three) times a week.    hydrocortisone 2.5 % lotion Apply topically 2 (two) times daily. Prn   Multiple Vitamin (MULTIVITAMIN WITH MINERALS) TABS tablet Take 1 tablet by mouth 3 (three) times a week.    nebivolol (BYSTOLIC) 2.5 MG tablet Take 1 tablet (2.5 mg total) by mouth daily.   neomycin-polymyxin-hydrocortisone (CORTISPORIN) OTIC solution Place 4 drops into the right ear 3 (three) times daily. Right ear x 4-7 days after debrox   Polyethyl Glycol-Propyl Glycol (SYSTANE OP) Place 1 drop into both eyes daily as needed (dry eyes).    rosuvastatin (CRESTOR) 20 MG tablet Take 1 tablet (20 mg total) by mouth daily. D/c lipitor 40   [DISCONTINUED] ezetimibe (ZETIA) 10 MG tablet Take 1 tablet (10 mg total) by mouth daily.   [DISCONTINUED] fluticasone (FLONASE) 50 MCG/ACT nasal spray Place 2 sprays into both nostrils daily as needed for allergies or rhinitis.   [DISCONTINUED] levocetirizine (XYZAL) 5 MG tablet Take 0.5 tablets (2.5 mg total) by mouth every evening.   [DISCONTINUED] pantoprazole (PROTONIX) 40 MG tablet Take 1 tablet (40 mg total) by mouth daily. 30 minutes before food   [DISCONTINUED] sodium chloride (OCEAN) 0.65 % SOLN nasal spray Place 1 spray into both nostrils as needed for congestion.   Allergies  Allergen Reactions   Latex Itching   Penicillins Itching and Rash    Has patient had a PCN reaction causing immediate rash, facial/tongue/throat swelling, SOB or lightheadedness  with hypotension: Yes Has patient had a PCN reaction causing severe rash involving mucus membranes or skin necrosis: No Has patient had a PCN reaction that required hospitalization No Has patient had a PCN reaction occurring within the last 10 years: No If all of the above answers are "NO", then may proceed with Cephalosporin use.    No results found for this or any previous visit (from the past 2160 hour(s)). Objective  Body mass index is 32.45 kg/m. Wt Readings from Last 3 Encounters:  08/06/21 177 lb 6.4 oz (80.5 kg)  05/19/21 177 lb 4 oz (80.4 kg)  05/14/21 175 lb 8 oz (79.6 kg)   Temp  Readings from Last 3 Encounters:  08/06/21 (!) 97.2 F (36.2 C) (Temporal)  05/06/21 (!) 96.9 F (36.1 C)  02/04/21 97.7 F (36.5 C) (Oral)   BP Readings from Last 3 Encounters:  08/06/21 126/68  05/19/21 130/70  05/14/21 138/76   Pulse Readings from Last 3 Encounters:  08/06/21 62  05/19/21 72  05/14/21 79    Physical Exam Vitals and nursing note reviewed.  Constitutional:      Appearance: Normal appearance. She is well-developed and well-groomed.  HENT:     Head: Normocephalic and atraumatic.  Eyes:     Conjunctiva/sclera: Conjunctivae normal.     Pupils: Pupils are equal, round, and reactive to light.  Cardiovascular:     Rate and Rhythm: Normal rate and regular rhythm.     Heart sounds: Normal heart sounds. No murmur heard. Pulmonary:     Effort: Pulmonary effort is normal.     Breath sounds: Normal breath sounds.  Abdominal:     General: Abdomen is flat. Bowel sounds are normal.     Tenderness: There is no abdominal tenderness.  Musculoskeletal:        General: No tenderness.  Skin:    General: Skin is warm and dry.  Neurological:     General: No focal deficit present.     Mental Status: She is alert and oriented to person, place, and time. Mental status is at baseline.     Cranial Nerves: Cranial nerves 2-12 are intact.     Gait: Gait is intact.  Psychiatric:         Attention and Perception: Attention and perception normal.        Mood and Affect: Mood and affect normal.        Speech: Speech normal.        Behavior: Behavior normal. Behavior is cooperative.        Thought Content: Thought content normal.        Cognition and Memory: Cognition and memory normal.        Judgment: Judgment normal.    Assessment  Plan  Hypertension, unspecified type Bystolic 2.5 mg qd   Hyperlipidemia, unspecified hyperlipidemia type - Plan: ezetimibe (ZETIA) 10 MG tablet  Non-seasonal allergic rhinitis, unspecified trigger - Plan: fluticasone (FLONASE) 50 MCG/ACT nasal spray, levocetirizine (XYZAL) 5 MG tablet, sodium chloride (OCEAN) 0.65 % SOLN nasal spray   Gastroesophageal reflux disease - Plan: pantoprazole (PROTONIX) 40 MG tablet  Right ear pain - Plan: neomycin-polymyxin-hydrocortisone (CORTISPORIN) OTIC solution  Excessive cerumen in ear canal, right   HM Flu shot utd 03/28/21 prevnar and pna 23 utd Tdap and shingrix disc in future disc today she thinks she has had -ROI sent [facility] covid vx had 4/4   S/p left mastectomy 09/25/08 right mammo negative 09/26/18 reordered 2021  sch 09/30/20 c/o bone pain ? If should do bone scan/cancer markers with h/o  Chronic lymphedema left upper arm  F/u due [place] in 10/03/2021    Out of age window pap   Colonoscopy had years ago Dr. Tiffany Kocher -02/04/11 sessile tubular adenoma/IH; EGD duodenal polyp 12/24/14 neg path, hiatal hernia Referred [facility] had 06/17/20 tubular adenoma f/u in 5 years    DEXA +osteoporosis try to get prolia approved had 09/2019 2nd dose in 03/2020 due   Started prolia pt thinks causing rash, joint pain and muscle aches    Dr. Candiss Norse f/u as of 05/06/21 due for f/u 09/15/21     Prior PCP Dr. Romana Juniper  Provider: Dr. Olivia Mackie  [facility]

## 2021-08-21 ENCOUNTER — Ambulatory Visit: Payer: Medicare Other | Admitting: Cardiology

## 2021-10-01 ENCOUNTER — Ambulatory Visit
Admission: RE | Admit: 2021-10-01 | Discharge: 2021-10-01 | Disposition: A | Discharge status: Home / Self Care | Payer: Medicare Other | Source: Ambulatory Visit | Attending: Oncology | Admitting: Oncology

## 2021-10-01 ENCOUNTER — Other Ambulatory Visit: Payer: Self-pay

## 2021-10-01 DIAGNOSIS — Z1231 Encounter for screening mammogram for malignant neoplasm of breast: Secondary | ICD-10-CM | POA: Insufficient documentation

## 2021-10-01 DIAGNOSIS — Z9012 Acquired absence of left breast and nipple: Secondary | ICD-10-CM | POA: Insufficient documentation

## 2021-10-01 DIAGNOSIS — C50412 Malignant neoplasm of upper-outer quadrant of left female breast: Secondary | ICD-10-CM | POA: Diagnosis not present

## 2021-10-01 DIAGNOSIS — Z171 Estrogen receptor negative status [ER-]: Secondary | ICD-10-CM | POA: Insufficient documentation

## 2021-10-05 NOTE — Progress Notes (Signed)
?[place]  ?[phone] [phone] ? ?ID: Maria Patterson OB: 20-Jul-1941  MR#: [id number]  CSN#:[id number] ? ?Patient Care Team: ?[facility], Nino Glow, MD as PCP - General (Internal Medicine) ?Kate Sable, MD as PCP - Cardiology (Cardiology) ?Theodore Demark, RN as Oncology Nurse Navigator ?Clayburn Pert, MD as Consulting Physician (General Surgery) ?Lloyd Huger, MD as Consulting Physician (Oncology) ?Olean Ree, MD as Consulting Physician (Surgery) ?Noreene Filbert, MD as Referring Physician (Radiation Oncology) ? 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 ? ?INTERVAL HISTORY: Patient returns to clinic today for routine yearly evaluation.  She continues to have problems with lymphedema in her left arm, but otherwise feels well.  She has no neurologic complaints. She denies any recent fevers or illnesses. She has a good appetite and denies weight loss.  She denies any chest pain, shortness of breath, cough, or hemoptysis.  She denies any nausea, vomiting, constipation, or diarrhea. She has no urinary complaints.  Patient offers no further specific complaints today. ? ?REVIEW OF SYSTEMS:   ?Review of Systems  ?Constitutional: Negative.  Negative for fever, malaise/fatigue and weight loss.  ?Respiratory: Negative.  Negative for cough and shortness of breath.   ?Cardiovascular: Negative.  Negative for chest pain and leg swelling.  ?Gastrointestinal: Negative.  Negative for abdominal pain and constipation.  ?Genitourinary: Negative.  Negative for dysuria.  ?Musculoskeletal: Negative.  Negative for back pain, joint pain and myalgias.  ?Skin: Negative.  Negative for rash.  ?Neurological: Negative.  Negative for sensory change, focal weakness, weakness and headaches.  ?Psychiatric/Behavioral:  Negative for depression. The patient is not nervous/anxious.   ? ?As per  HPI. Otherwise, a complete review of systems is negative. ? ?PAST MEDICAL HISTORY: ?Past Medical History:  ?Diagnosis Date  ? Anemia   ? Breast cancer ([place]) 2018  ? Left  ? Breast mass, left 09/22/2016  ? RECOMMENDATION: Ultrasound-guided core biopsies of masses at left breast 2 o'clock 2 cm from nipple, left breast 2 o'clock 5 cm from nipple, abnormal left axillary lymph nodes.  ? COPD (chronic obstructive pulmonary disease) ([place])   ? Depression   ? GERD (gastroesophageal reflux disease)   ? History of chickenpox   ? Hx of adenomatous colonic polyps   ? Hyperglycemia   ? Hyperlipidemia   ? Obesity   ? Personal history of chemotherapy 2018  ? Left breast  ? Personal history of radiation therapy 2018  ? Postmenopausal   ? Shingles   ? right eye   ? ? ?PAST SURGICAL HISTORY: ?Past Surgical History:  ?Procedure Laterality Date  ? ABDOMINAL HYSTERECTOMY    ? AXILLARY SENTINEL NODE BIOPSY Left 04/14/2017  ? Procedure: AXILLARY SENTINEL NODE BIOPSY;  Surgeon: Olean Ree, MD;  Location: [facility] ORS;  Service: General;  Laterality: Left;  ? BREAST BIOPSY Left 09/30/2016  ? US biopsy of 3 areas + chemo  ? BREAST EXCISIONAL BIOPSY Left 04/14/2017  ? Left breast invasive cancer left mastectomy  ? COLONOSCOPY WITH PROPOFOL N/A 06/17/2020  ? Procedure: COLONOSCOPY WITH PROPOFOL;  Surgeon: Toledo, Benay Pike, MD;  Location: [facility] ENDOSCOPY;  Service: Gastroenterology;  Laterality: N/A;  ? ESOPHAGOGASTRODUODENOSCOPY (EGD) WITH PROPOFOL N/A 12/24/2014  ? Procedure: ESOPHAGOGASTRODUODENOSCOPY (EGD) WITH PROPOFOL;  Surgeon: Manya Silvas, MD;  Location: [facility] ENDOSCOPY;  Service: Endoscopy;  Laterality: N/A;  ? MASTECTOMY Left 04/14/2017  ? Left breast invasive cancer  ? PORTACATH PLACEMENT Right 10/27/2016  ? Procedure:  INSERTION PORT-A-CATH;  Surgeon: Nestor Lewandowsky, MD;  Location: [facility] ORS;  Service: General;  Laterality: Right;  ? TONSILECTOMY, ADENOIDECTOMY, BILATERAL MYRINGOTOMY AND TUBES    ? TONSILLECTOMY    ? TOTAL MASTECTOMY Left  04/14/2017  ? Procedure: TOTAL MASTECTOMY;  Surgeon: Olean Ree, MD;  Location: [facility] ORS;  Service: General;  Laterality: Left;  ? ? ?FAMILY HISTORY: ?Family History  ?Problem Relation Age of Onset  ? Stroke Mother   ? Stroke Father   ? Cancer Sister   ?     with mets as of 09/18/20  ? Cancer Daughter   ?     as of 05/06/21 dx ? cancer type 58  ? Depression Neg Hx   ? Breast cancer Neg Hx   ? ? ?ADVANCED DIRECTIVES (Y/N):  N ? ?HEALTH MAINTENANCE: ?Social History  ? ?Tobacco Use  ? Smoking status: Never  ? Smokeless tobacco: Never  ? Tobacco comments:  ?  smoking cessation materials not required  ?Vaping Use  ? Vaping Use: Never used  ?Substance Use Topics  ? Alcohol use: No  ?  Alcohol/week: 0.0 standard drinks  ? Drug use: No  ? ? ? Colonoscopy: ? PAP: ? Bone density: ? Lipid panel: ? ?Allergies  ?Allergen Reactions  ? Latex Itching  ? Penicillins Itching and Rash  ?  Has patient had a PCN reaction causing immediate rash, facial/tongue/throat swelling, SOB or lightheadedness with hypotension: Yes ?Has patient had a PCN reaction causing severe rash involving mucus membranes or skin necrosis: No ?Has patient had a PCN reaction that required hospitalization No ?Has patient had a PCN reaction occurring within the last 10 years: No ?If all of the above answers are "NO", then may proceed with Cephalosporin use. ?  ? ? ?Current Outpatient Medications  ?Medication Sig Dispense Refill  ? acetaminophen (TYLENOL) 325 MG tablet Take 2 tablets (650 mg total) by mouth every 6 (six) hours as needed. 60 tablet 0  ? aspirin 81 MG tablet Take 81 mg by mouth. Takes every 3-4 days.    ? carbamide peroxide (DEBROX) 6.5 % OTIC solution Place 5 drops into the right ear 2 (two) times daily. X 4-7 days as needed right ear wax 15 mL 0  ? Cholecalciferol (VITAMIN D) 2000 units tablet Take 2,000 Units by mouth 3 (three) times a week.     ? ezetimibe (ZETIA) 10 MG tablet Take 1 tablet (10 mg total) by mouth daily. 90 tablet 3  ? ferrous  sulfate 325 (65 FE) MG EC tablet Take 325 mg by mouth 3 (three) times a week.     ? fluticasone (FLONASE) 50 MCG/ACT nasal spray Place 2 sprays into both nostrils daily as needed for allergies or rhinitis. 16 g 11  ? hydrocortisone 2.5 % lotion Apply topically 2 (two) times daily. Prn 118 mL 2  ? levocetirizine (XYZAL) 5 MG tablet Take 0.5 tablets (2.5 mg total) by mouth every evening. 90 tablet 3  ? Multiple Vitamin (MULTIVITAMIN WITH MINERALS) TABS tablet Take 1 tablet by mouth 3 (three) times a week.     ? nebivolol (BYSTOLIC) 2.5 MG tablet Take 1 tablet (2.5 mg total) by mouth daily. 90 tablet 3  ? neomycin-polymyxin-hydrocortisone (CORTISPORIN) OTIC solution Place 4 drops into the right ear 3 (three) times daily. Right ear x 4-7 days after debrox 10 mL 0  ? pantoprazole (PROTONIX) 40 MG tablet Take 1 tablet (40 mg total) by mouth daily. 30 minutes before food 90 tablet 3  ?  Polyethyl Glycol-Propyl Glycol (SYSTANE OP) Place 1 drop into both eyes daily as needed (dry eyes).     ? rosuvastatin (CRESTOR) 20 MG tablet Take 1 tablet (20 mg total) by mouth daily. D/c lipitor 40 90 tablet 3  ? sodium chloride (OCEAN) 0.65 % SOLN nasal spray Place 1 spray into both nostrils as needed for congestion. 60 mL 11  ? ?No current facility-administered medications for this visit.  ? ? ?OBJECTIVE: ?Vitals:  ? 10/07/21 1054  ?BP: (!) 141/66  ?Pulse: (!) 58  ?Temp: (!) 97.2 ?F (36.2 ?C)  ?SpO2: 99%  ?   Body mass index is 32.54 kg/m?Marland Kitchen    ECOG FS:0 - Asymptomatic ? ?General: Well-developed, well-nourished, no acute distress. ?Eyes: Pink conjunctiva, anicteric sclera. ?HEENT: Normocephalic, moist mucous membranes. ?Breast: Left mastectomy. ?Lungs: No audible wheezing or coughing. ?Heart: Regular rate and rhythm. ?Abdomen: Soft, nontender, no obvious distention. ?Musculoskeletal: No edema, cyanosis, or clubbing.  Left arm lymphedema. ?Neuro: Alert, answering all questions appropriately. Cranial nerves grossly intact. ?Skin: No rashes  or petechiae noted. ?Psych: Normal affect. ? ? ? ?LAB RESULTS: ? ?Lab Results  ?Component Value Date  ? NA 143 02/04/2021  ? K 4.9 02/04/2021  ? CL 110 02/04/2021  ? CO2 25 02/04/2021  ? GLUCOSE 82 02/04/2021

## 2021-10-06 DIAGNOSIS — I10 Essential (primary) hypertension: Secondary | ICD-10-CM | POA: Diagnosis not present

## 2021-10-06 DIAGNOSIS — N1832 Chronic kidney disease, stage 3b: Secondary | ICD-10-CM | POA: Diagnosis not present

## 2021-10-06 DIAGNOSIS — N281 Cyst of kidney, acquired: Secondary | ICD-10-CM | POA: Diagnosis not present

## 2021-10-07 ENCOUNTER — Inpatient Hospital Stay: Payer: Medicare Other | Attending: Oncology | Admitting: Oncology

## 2021-10-07 ENCOUNTER — Other Ambulatory Visit: Payer: Self-pay

## 2021-10-07 VITALS — BP 141/66 | HR 58 | Temp 97.2°F | Ht 62.0 in | Wt 177.9 lb

## 2021-10-07 DIAGNOSIS — Z923 Personal history of irradiation: Secondary | ICD-10-CM | POA: Diagnosis not present

## 2021-10-07 DIAGNOSIS — Z79899 Other long term (current) drug therapy: Secondary | ICD-10-CM | POA: Diagnosis not present

## 2021-10-07 DIAGNOSIS — Z171 Estrogen receptor negative status [ER-]: Secondary | ICD-10-CM | POA: Diagnosis not present

## 2021-10-07 DIAGNOSIS — Z880 Allergy status to penicillin: Secondary | ICD-10-CM | POA: Insufficient documentation

## 2021-10-07 DIAGNOSIS — I890 Lymphedema, not elsewhere classified: Secondary | ICD-10-CM | POA: Diagnosis not present

## 2021-10-07 DIAGNOSIS — Z823 Family history of stroke: Secondary | ICD-10-CM | POA: Diagnosis not present

## 2021-10-07 DIAGNOSIS — Z9221 Personal history of antineoplastic chemotherapy: Secondary | ICD-10-CM | POA: Diagnosis not present

## 2021-10-07 DIAGNOSIS — C50412 Malignant neoplasm of upper-outer quadrant of left female breast: Secondary | ICD-10-CM | POA: Diagnosis not present

## 2021-10-07 DIAGNOSIS — Z809 Family history of malignant neoplasm, unspecified: Secondary | ICD-10-CM | POA: Diagnosis not present

## 2021-10-07 DIAGNOSIS — N289 Disorder of kidney and ureter, unspecified: Secondary | ICD-10-CM | POA: Insufficient documentation

## 2021-10-07 DIAGNOSIS — Z79811 Long term (current) use of aromatase inhibitors: Secondary | ICD-10-CM | POA: Diagnosis not present

## 2021-10-07 NOTE — Progress Notes (Signed)
Pt reports small nose bleeds she believes is caused by seasonal allergies, unrelieved by flonase. ?

## 2021-10-10 ENCOUNTER — Other Ambulatory Visit: Payer: Self-pay

## 2021-10-10 ENCOUNTER — Ambulatory Visit: Payer: Medicare Other | Admitting: Surgery

## 2021-10-10 ENCOUNTER — Encounter: Payer: Self-pay | Admitting: Surgery

## 2021-10-10 VITALS — BP 159/73 | HR 51 | Temp 98.3°F | Ht 62.0 in | Wt 180.8 lb

## 2021-10-10 DIAGNOSIS — Z171 Estrogen receptor negative status [ER-]: Secondary | ICD-10-CM | POA: Diagnosis not present

## 2021-10-10 DIAGNOSIS — C50412 Malignant neoplasm of upper-outer quadrant of left female breast: Secondary | ICD-10-CM

## 2021-10-10 NOTE — Patient Instructions (Signed)
If you have any concerns or questions, please feel free to call our office. Follow up as needed.  ? ?Breast Self-Awareness ?Breast self-awareness is knowing how your breasts look and feel. Doing breast self-awareness is important. It allows you to catch a breast problem early while it is still small and can be treated. All women should do breast self-awareness, including women who have had breast implants. Tell your doctor if you notice a change in your breasts. ?What you need: ?A mirror. ?A well-lit room. ?How to do a breast self-exam ?A breast self-exam is one way to learn what is normal for your breasts and to check for changes. To do a breast self-exam: ?Look for changes ? ?Take off all the clothes above your waist. ?Stand in front of a mirror in a room with good lighting. ?Put your hands on your hips. ?Push your hands down. ?Look at your breasts and nipples in the mirror to see if one breast or nipple looks different from the other. Check to see if: ?The shape of one breast is different. ?The size of one breast is different. ?There are wrinkles, dips, and bumps in one breast and not the other. ?Look at each breast for changes in the skin, such as: ?Redness. ?Scaly areas. ?Look for changes in your nipples, such as: ?Liquid around the nipples. ?Bleeding. ?Dimpling. ?Redness. ?A change in where the nipples are. ?Feel for changes ? ?Lie on your back on the floor. ?Feel each breast. To do this, follow these steps: ?Pick a breast to feel. ?Put the arm closest to that breast above your head. ?Use your other arm to feel the nipple area of your breast. Feel the area with the pads of your three middle fingers by making small circles with your fingers. For the first circle, press lightly. For the second circle, press harder. For the third circle, press even harder. ?Keep making circles with your fingers at the different pressures as you move down your breast. Stop when you feel your ribs. ?Move your fingers a little  toward the center of your body. ?Start making circles with your fingers again, this time going up until you reach your collarbone. ?Keep making up-and-down circles until you reach your armpit. Remember to keep using the three pressures. ?Feel the other breast in the same way. ?Sit or stand in the tub or shower. ?With soapy water on your skin, feel each breast the same way you did in step 2 when you were lying on the floor. ?Write down what you find ?Writing down what you find can help you remember what to tell your doctor. Write down: ?What is normal for each breast. ?Any changes you find in each breast, including: ?The kind of changes you find. ?Whether you have pain. ?Size and location of any lumps. ?When you last had your menstrual period. ?General tips ?Check your breasts every month. ?If you are breastfeeding, the best time to check your breasts is after you feed your baby or after you use a breast pump. ?If you get menstrual periods, the best time to check your breasts is 5-7 days after your menstrual period is over. ?With time, you will become comfortable with the self-exam, and you will begin to know if there are changes in your breasts. ?Contact a doctor if you: ?See a change in the shape or size of your breasts or nipples. ?See a change in the skin of your breast or nipples, such as red or scaly skin. ?Have fluid coming from  your nipples that is not normal. ?Find a lump or thick area that was not there before. ?Have pain in your breasts. ?Have any concerns about your breast health. ?Summary ?Breast self-awareness includes looking for changes in your breasts, as well as feeling for changes within your breasts. ?Breast self-awareness should be done in front of a mirror in a well-lit room. ?You should check your breasts every month. If you get menstrual periods, the best time to check your breasts is 5-7 days after your menstrual period is over. ?Let your doctor know of any changes you see in your breasts,  including changes in size, changes on the skin, pain or tenderness, or fluid from your nipples that is not normal. ?This information is not intended to replace advice given to you by your health care provider. Make sure you discuss any questions you have with your health care provider. ?Document Revised: 02/22/2018 Document Reviewed: 02/22/2018 ?Elsevier Patient Education ? [place]. ? ?

## 2021-10-12 ENCOUNTER — Encounter: Payer: Self-pay | Admitting: Surgery

## 2021-10-12 NOTE — Progress Notes (Addendum)
?10/10/2021 ? ?History of Present Illness: ?Maria Patterson is a 81 y.o. female s/p left mastectomy and SLNBx on 04/14/17 for left breast cancer.  She presents today for follow up.  Reports that she's doing ok, but continues with lymphedema of the left arm.  Feels that some things are not working for it anymore, and is trying to get an appointment with the lymphedema clinic again.  Denies any palpable mass, skin changes, or other concerns.  She had a right breast screening mammogram on 10/01/21 which did not show any suspicious findings.  I have personally viewed the images. ? ?Past Medical History: ?Past Medical History:  ?Diagnosis Date  ? Anemia   ? Breast cancer ([place]) 2018  ? Left  ? Breast mass, left 09/22/2016  ? RECOMMENDATION: Ultrasound-guided core biopsies of masses at left breast 2 o'clock 2 cm from nipple, left breast 2 o'clock 5 cm from nipple, abnormal left axillary lymph nodes.  ? COPD (chronic obstructive pulmonary disease) ([place])   ? Depression   ? GERD (gastroesophageal reflux disease)   ? History of chickenpox   ? Hx of adenomatous colonic polyps   ? Hyperglycemia   ? Hyperlipidemia   ? Obesity   ? Personal history of chemotherapy 2018  ? Left breast  ? Personal history of radiation therapy 2018  ? Postmenopausal   ? Shingles   ? right eye   ?  ? ?Past Surgical History: ?Past Surgical History:  ?Procedure Laterality Date  ? ABDOMINAL HYSTERECTOMY    ? AXILLARY SENTINEL NODE BIOPSY Left 04/14/2017  ? Procedure: AXILLARY SENTINEL NODE BIOPSY;  Surgeon: Olean Ree, MD;  Location: [facility] ORS;  Service: General;  Laterality: Left;  ? BREAST BIOPSY Left 09/30/2016  ? US biopsy of 3 areas + chemo  ? BREAST EXCISIONAL BIOPSY Left 04/14/2017  ? Left breast invasive cancer left mastectomy  ? COLONOSCOPY WITH PROPOFOL N/A 06/17/2020  ? Procedure: COLONOSCOPY WITH PROPOFOL;  Surgeon: Toledo, Benay Pike, MD;  Location: [facility] ENDOSCOPY;  Service: Gastroenterology;  Laterality: N/A;  ? ESOPHAGOGASTRODUODENOSCOPY (EGD)  WITH PROPOFOL N/A 12/24/2014  ? Procedure: ESOPHAGOGASTRODUODENOSCOPY (EGD) WITH PROPOFOL;  Surgeon: Manya Silvas, MD;  Location: [facility] ENDOSCOPY;  Service: Endoscopy;  Laterality: N/A;  ? MASTECTOMY Left 04/14/2017  ? Left breast invasive cancer  ? PORTACATH PLACEMENT Right 10/27/2016  ? Procedure: INSERTION PORT-A-CATH;  Surgeon: Nestor Lewandowsky, MD;  Location: [facility] ORS;  Service: General;  Laterality: Right;  ? TONSILECTOMY, ADENOIDECTOMY, BILATERAL MYRINGOTOMY AND TUBES    ? TONSILLECTOMY    ? TOTAL MASTECTOMY Left 04/14/2017  ? Procedure: TOTAL MASTECTOMY;  Surgeon: Olean Ree, MD;  Location: [facility] ORS;  Service: General;  Laterality: Left;  ? ? ?Home Medications: ?Prior to Admission medications   ?Medication Sig Start Date End Date Taking? Authorizing Provider  ?acetaminophen (TYLENOL) 325 MG tablet Take 2 tablets (650 mg total) by mouth every 6 (six) hours as needed. 01/15/18  Yes Carrie Mew, MD  ?aspirin 81 MG tablet Take 81 mg by mouth. Takes every 3-4 days.   Yes [provider]  ?carbamide peroxide (DEBROX) 6.5 % OTIC solution Place 5 drops into the right ear 2 (two) times daily. X 4-7 days as needed right ear wax 08/06/21  Yes McLean-Scocuzza, Nino Glow, MD  ?Cholecalciferol (VITAMIN D) 2000 units tablet Take 2,000 Units by mouth 3 (three) times a week.  09/01/16  Yes Lada, Satira Anis, MD  ?ezetimibe (ZETIA) 10 MG tablet Take 1 tablet (10 mg total) by mouth daily. 08/06/21  Yes McLean-Scocuzza, Nino Glow, MD  ?ferrous sulfate 325 (65 FE) MG EC tablet Take 325 mg by mouth 3 (three) times a week.  09/01/16  Yes Lada, Satira Anis, MD  ?fluticasone (FLONASE) 50 MCG/ACT nasal spray Place 2 sprays into both nostrils daily as needed for allergies or rhinitis. 08/06/21  Yes McLean-Scocuzza, Nino Glow, MD  ?hydrocortisone 2.5 % lotion Apply topically 2 (two) times daily. Prn 12/20/20  Yes McLean-Scocuzza, Nino Glow, MD  ?levocetirizine (XYZAL) 5 MG tablet Take 0.5 tablets (2.5 mg total) by mouth every evening. 08/06/21   Yes McLean-Scocuzza, Nino Glow, MD  ?Multiple Vitamin (MULTIVITAMIN WITH MINERALS) TABS tablet Take 1 tablet by mouth 3 (three) times a week.  09/01/16  Yes Lada, Satira Anis, MD  ?nebivolol (BYSTOLIC) 2.5 MG tablet Take 1 tablet (2.5 mg total) by mouth daily. 05/26/21  Yes McLean-Scocuzza, Nino Glow, MD  ?neomycin-polymyxin-hydrocortisone (CORTISPORIN) OTIC solution Place 4 drops into the right ear 3 (three) times daily. Right ear x 4-7 days after debrox 08/06/21  Yes McLean-Scocuzza, Nino Glow, MD  ?pantoprazole (PROTONIX) 40 MG tablet Take 1 tablet (40 mg total) by mouth daily. 30 minutes before food 08/06/21  Yes McLean-Scocuzza, Nino Glow, MD  ?Polyethyl Glycol-Propyl Glycol (SYSTANE OP) Place 1 drop into both eyes daily as needed (dry eyes).    Yes [provider]  ?rosuvastatin (CRESTOR) 20 MG tablet Take 1 tablet (20 mg total) by mouth daily. D/c lipitor 40 05/06/21  Yes McLean-Scocuzza, Nino Glow, MD  ?sodium chloride (OCEAN) 0.65 % SOLN nasal spray Place 1 spray into both nostrils as needed for congestion. 08/06/21  Yes McLean-Scocuzza, Nino Glow, MD  ? ? ?Allergies: ?Allergies  ?Allergen Reactions  ? Latex Itching  ? Penicillins Itching and Rash  ?  Has patient had a PCN reaction causing immediate rash, facial/tongue/throat swelling, SOB or lightheadedness with hypotension: Yes ?Has patient had a PCN reaction causing severe rash involving mucus membranes or skin necrosis: No ?Has patient had a PCN reaction that required hospitalization No ?Has patient had a PCN reaction occurring within the last 10 years: No ?If all of the above answers are "NO", then may proceed with Cephalosporin use. ?  ? ? ?Review of Systems: ?Review of Systems  ?Constitutional:  Negative for chills and fever.  ?Respiratory:  Negative for shortness of breath.   ?Cardiovascular:  Negative for chest pain.  ?Gastrointestinal:  Negative for abdominal pain, nausea and vomiting.  ?Musculoskeletal:   ?     Left arm lymphedema  ? ?Physical Exam ?BP  (!) 159/73   Pulse (!) 51   Temp 98.3 ?F (36.8 ?C) (Oral)   Ht '5\' 2"'$  (1.575 m)   Wt 180 lb 12.8 oz (82 kg)   SpO2 100%   BMI 33.07 kg/m?  ?CONSTITUTIONAL: No acute distress, well nourished. ?HEENT:  Normocephalic, atraumatic, extraocular motion intact. ?RESPIRATORY:  Normal respiratory effort without pathologic use of accessory muscles. ?CARDIOVASCULAR: Regular rhythm and rate. ?BREAST:  Left breast s/p mastectomy with well healed scar.  No palpable masses, and scar nodule in mid portion of incision is stable.  No new palpable masses, skin changes.  No left axillary lymphadenopathy.  Right breast without any palpable masses, skin changes, or nipple changes.  No right axillary lymphadenopathy. ?MUSCULOSKELETAL:  Lymphedema of the left upper extremity. ?NEUROLOGIC:  Motor and sensation is grossly normal.  Cranial nerves are grossly intact. ?PSYCH:  Alert and oriented to person, place and time. Affect is normal. ? ?Labs/Imaging: ?Mammogram 10/01/21: ?FINDINGS: ?There are no  findings suspicious for malignancy. Status post LEFT mastectomy. ?  ?IMPRESSION: ?No mammographic evidence of malignancy. A result letter of this screening mammogram will be mailed directly to the patient. ?  ?RECOMMENDATION: ?Screening mammogram in one year. (Code:SM-B-01Y) ?  ?BI-RADS CATEGORY  1: Negative. ? ?Assessment and Plan: ?This is a 81 y.o. female s/p left mastectomy and SLNBx ? ?--Patient had a negative right breast mammogram and on exam, there are no suspicious findings or changes on the mastectomy site or right breast.  She is now 5 years from her surgery and does not have any evidence of recurrence.  Discussed that it would be important to continue following with the lymphedema clinic to help with the lymphedema of the left arm.  From surgical standpoint, she may follow up as needed, but stressed the importance of continuing exams and mammograms.  We're readily available if any concerns, questions, or issues. ? ?I spent 15  minutes dedicated to the care of this patient on the date of this encounter to include pre-visit review of records, face-to-face time with the patient discussing diagnosis and management, and any post-visit coordina

## 2021-10-22 ENCOUNTER — Inpatient Hospital Stay: Payer: Medicare Other | Attending: Oncology | Admitting: Occupational Therapy

## 2021-10-22 DIAGNOSIS — I890 Lymphedema, not elsewhere classified: Secondary | ICD-10-CM

## 2021-10-22 NOTE — Therapy (Signed)
[facility] ?[address], [address] ?[place], [place], [address] ?Phone: [phone]   Fax:  [phone] ? ?Occupational Therapy Screen: ? ?Patient Details  ?Name: Maria Patterson ?MRN: [id number] ?Date of Birth: 1940-09-02 ?No data recorded ? ?Encounter Date: 10/22/2021 ? ? OT End of Session - 10/22/21 1206   ? ? Visit Number 0   ? ?  ?  ? ?  ? ? ?Past Medical History:  ?Diagnosis Date  ? Anemia   ? Breast cancer ([place]) 2018  ? Left  ? Breast mass, left 09/22/2016  ? RECOMMENDATION: Ultrasound-guided core biopsies of masses at left breast 2 o'clock 2 cm from nipple, left breast 2 o'clock 5 cm from nipple, abnormal left axillary lymph nodes.  ? COPD (chronic obstructive pulmonary disease) ([place])   ? Depression   ? GERD (gastroesophageal reflux disease)   ? History of chickenpox   ? Hx of adenomatous colonic polyps   ? Hyperglycemia   ? Hyperlipidemia   ? Obesity   ? Personal history of chemotherapy 2018  ? Left breast  ? Personal history of radiation therapy 2018  ? Postmenopausal   ? Shingles   ? right eye   ? ? ?Past Surgical History:  ?Procedure Laterality Date  ? ABDOMINAL HYSTERECTOMY    ? AXILLARY SENTINEL NODE BIOPSY Left 04/14/2017  ? Procedure: AXILLARY SENTINEL NODE BIOPSY;  Surgeon: Olean Ree, MD;  Location: [facility] ORS;  Service: General;  Laterality: Left;  ? BREAST BIOPSY Left 09/30/2016  ? US biopsy of 3 areas + chemo  ? BREAST EXCISIONAL BIOPSY Left 04/14/2017  ? Left breast invasive cancer left mastectomy  ? COLONOSCOPY WITH PROPOFOL N/A 06/17/2020  ? Procedure: COLONOSCOPY WITH PROPOFOL;  Surgeon: Toledo, Benay Pike, MD;  Location: [facility] ENDOSCOPY;  Service: Gastroenterology;  Laterality: N/A;  ? ESOPHAGOGASTRODUODENOSCOPY (EGD) WITH PROPOFOL N/A 12/24/2014  ? Procedure: ESOPHAGOGASTRODUODENOSCOPY (EGD) WITH PROPOFOL;  Surgeon: Manya Silvas, MD;  Location[facility] ENDOSCOPY;  Service: Endoscopy;  Laterality: N/A;  ? MASTECTOMY Left 04/14/2017  ? Left breast  invasive cancer  ? PORTACATH PLACEMENT Right 10/27/2016  ? Procedure: INSERTION PORT-A-CATH;  Surgeon: Nestor Lewandowsky, MD;  Location: [facility] ORS;  Service: General;  Laterality: Right;  ? TONSILECTOMY, ADENOIDECTOMY, BILATERAL MYRINGOTOMY AND TUBES    ? TONSILLECTOMY    ? TOTAL MASTECTOMY Left 04/14/2017  ? Procedure: TOTAL MASTECTOMY;  Surgeon: Olean Ree, MD;  Location: [facility] ORS;  Service: General;  Laterality: Left;  ? ? ?There were no vitals filed for this visit. ? ? Subjective Assessment - 10/22/21 1204   ? ? Subjective  I have been doing like you told me but my sleeve is tight does not fit anymore.  My shirts sleeves is hard to get on.  I do not wear my day sleeve anymore and the handpiece of my nighttime one is tight.  I did not had to use any antibiotics since have seen you   ? ?  ?  ? ?  ? ? ? ? ? ? LYMPHEDEMA/ONCOLOGY QUESTIONNAIRE - 10/22/21 0001   ? ?  ? Right Upper Extremity Lymphedema  ? 15 cm Proximal to Olecranon Process 32.5 cm   ? 10 cm Proximal to Olecranon Process 32 cm   ? Olecranon Process 26 cm   ? 15 cm Proximal to Ulnar Styloid Process 25.5 cm   ? 10 cm Proximal to Ulnar Styloid Process 22 cm   ? Just Proximal to Ulnar Styloid Process 17 cm   ?  ?  Left Upper Extremity Lymphedema  ? 15 cm Proximal to Olecranon Process 36 cm   ? 10 cm Proximal to Olecranon Process 37.5 cm   ? Olecranon Process 30.5 cm   ? 15 cm Proximal to Ulnar Styloid Process 33 cm   ? 10 cm Proximal to Ulnar Styloid Process 29 cm   ? Just Proximal to Ulnar Styloid Process 22.5 cm   ? Across [employer] at [employer] 20.5 cm   ? At [facility] of 2nd Digit 6.4 cm   ? At [facility] of [place]umb 6.4 cm   ? ?  ?  ? ?  ? ? ? 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 ?  ?PLAN:   ?  ?1. 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 She completed neoadjuvant Taxotere, carboplatinum, Herceptin, and Perjeta on February 17, 2017. She had a total mastectomy on April 14, 2017, therefore did not require adjuvant XRT.  She completed her year long maintenance Herceptin on November 03, 2017.  An aromatase inhibitor would not offer benefit given the ER/PR negativity of her disease.  No intervention is needed.  Patient's most recent right unilateral screening mammogram on October 01, 2021 was reported as BI-RADS 1.  Repeat in March 2024.  Patient also had a CT scan of the chest on September 19, 2018 did not reveal any evidence of disease.  Return to clinic in 1 year for routine evaluation.   ?2. Renal insufficiency: Chronic and unchanged.  Patient's most recent creatinine in July 2022 was reported at 1.35.  Follow-up with nephrology as indicated. ?4.  Lymphedema: Patient was given a referral back to lymphedema clinic. ?5.  Arthritis: Patient does not complain of this today.  Continue follow-up and treatment per PCP. ?  ? ? ?OT SCREEN 10/22/21: ? ?  Patient referred to OT for follow-up for left upper extremity lymphedema.  Patient was seen the first time by this OT in 2019 after she had 3 rounds of antibiotics for cellulitis to left arm.  Patient was fitted with a Jobst Elvarex soft sleeve and glove as well as a tribute night sleeve, and pump to use twice a day.  Patient was seen again for a follow-up and replacement of garments in 2001.  At that time lymphedema was still under control. ?Patient was supposed to follow-up with OT last year for replacement of day compression sleeve. ?Patient now with increased circumference of 3 to 4 cm in left upper extremity from upper arm to wrist putting her at increased risk for cellulitis. ?Her daytime compression sleeve is 83 years old and not fitting anymore. ?Patient would need CDT treatment to decrease circumference of left upper extremity but OT remember patient did not  like to be bandage previous times.  Patient do have a nighttime compression garment as well as a pump-patient to try that in the meantime for a week wearing nighttime garment as much as she can at home and nighttime-and using pumps twice a day morning and evening 45 minutes.  We will ask for OT order to eval and treat for left upper extremity decongestion and fitting for new compression garments.  Patient is scheduled for next Thursday at 9 AM sports physical rehab to assess if was able to decrease circumference  with nighttime garment and pump.  Otherwise we will need CDT treatment.  Patient in agreement ? ? ? ? ? ? ? ? ? ? ? ? ? OT Long Term Goals - 04/26/19 1029   ? ?  ? OT LONG TERM GOAL #1  ? Title Pt to be independent in wearing of new daytime compression sleeve  and donning it correctly with use of slippy gator device   ? Baseline pt ed on use of slippy gator -and new sleeve fist well - ed on wearing night time one correctly   ? Status Achieved   ? ?  ?  ? ?  ? ? ? ? ? ? ? ? ? ?Patient will benefit from skilled therapeutic intervention in order to improve the following deficits and impairments:   ?  ?  ?  ? ? ?Visit Diagnosis: ?Lymphedema of left upper extremity ? ? ? ?Problem List ?Patient Active Problem List  ? Diagnosis Date Noted  ? 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  ? Cervical arthritis 02/04/2021  ? History of breast cancer 02/04/2021  ? Spider vein of lower extremity 12/20/2020  ? Lymphedema of arm 09/18/2020  ? Chronic right shoulder pain 09/18/2020  ? Tubular adenoma 01/18/2020  ? Osteoporosis 05/12/2019  ? Anxiety and depression 05/12/2019  ? Herpes 05/12/2019  ? Dermatitis 05/12/2019  ? Colon polyps 03/17/2019  ? Pre-diabetes 02/19/2019  ? H/O total mastectomy of left breast 06/03/2018  ? Obesity (BMI 30.0-34.9) 03/08/2018  ? Insomnia 09/26/2017  ? Stage 3b chronic kidney disease ([place]) 08/09/2017  ? Hypomagnesemia 05/04/2017  ? Aortic atherosclerosis ([place]) 05/04/2017  ? Compression fracture  of first lumbar vertebra ([place]) 05/04/2017  ? Degenerative disc disease, lumbar 05/04/2017  ? Anemia 02/01/2017  ? Atherosclerosis of native arteries of extremity with intermittent claudication ([place]) 04

## 2021-10-30 ENCOUNTER — Ambulatory Visit: Payer: Medicare Other | Attending: Oncology | Admitting: Occupational Therapy

## 2021-10-30 ENCOUNTER — Encounter: Payer: Self-pay | Admitting: Occupational Therapy

## 2021-10-30 DIAGNOSIS — I972 Postmastectomy lymphedema syndrome: Secondary | ICD-10-CM

## 2021-10-30 NOTE — Therapy (Signed)
[place] [facility] ?[address] ?[place], [place], [address] ?Phone: [phone]   Fax:  [phone] ? ?Occupational Therapy Evaluation ? ?Patient Details  ?Name: Maria Patterson ?MRN: [id number] ?Date of Birth: 29-Aug-1940 ?No data recorded ? ?Encounter Date: 10/30/2021 ? ? OT End of Session - 10/30/21 1423   ? ? Visit Number 1   ? Number of Visits 6   ? Date for OT Re-Evaluation 12/11/21   ? OT Start Time [phone]   ? OT Stop Time 2355   ? OT Time Calculation (min) 35 min   ? Activity Tolerance Patient tolerated treatment well   ? Behavior During Therapy [facility] for tasks assessed/performed   ? ?  ?  ? ?  ? ? ?Past Medical History:  ?Diagnosis Date  ? Anemia   ? Breast cancer ([facility]) 2018  ? Left  ? Breast mass, left 09/22/2016  ? RECOMMENDATION: Ultrasound-guided core biopsies of masses at left breast 2 o'clock 2 cm from nipple, left breast 2 o'clock 5 cm from nipple, abnormal left axillary lymph nodes.  ? COPD (chronic obstructive pulmonary disease) ([place])   ? Depression   ? GERD (gastroesophageal reflux disease)   ? History of chickenpox   ? Hx of adenomatous colonic polyps   ? Hyperglycemia   ? Hyperlipidemia   ? Obesity   ? Personal history of chemotherapy 2018  ? Left breast  ? Personal history of radiation therapy 2018  ? Postmenopausal   ? Shingles   ? right eye   ? ? ?Past Surgical History:  ?Procedure Laterality Date  ? ABDOMINAL HYSTERECTOMY    ? AXILLARY SENTINEL NODE BIOPSY Left 04/14/2017  ? Procedure: AXILLARY SENTINEL NODE BIOPSY;  Surgeon: Olean Ree, MD;  Location: [facility] ORS;  Service: General;  Laterality: Left;  ? BREAST BIOPSY Left 09/30/2016  ? US biopsy of 3 areas + chemo  ? BREAST EXCISIONAL BIOPSY Left 04/14/2017  ? Left breast invasive cancer left mastectomy  ? COLONOSCOPY WITH PROPOFOL N/A 06/17/2020  ? Procedure: COLONOSCOPY WITH PROPOFOL;  Surgeon: Toledo, Benay Pike, MD;  Location: [facility] ENDOSCOPY;  Service: Gastroenterology;  Laterality: N/A;   ? ESOPHAGOGASTRODUODENOSCOPY (EGD) WITH PROPOFOL N/A 12/24/2014  ? Procedure: ESOPHAGOGASTRODUODENOSCOPY (EGD) WITH PROPOFOL;  Surgeon: Manya Silvas, MD;  Location: [facility] ENDOSCOPY;  Service: Endoscopy;  Laterality: N/A;  ? MASTECTOMY Left 04/14/2017  ? Left breast invasive cancer  ? PORTACATH PLACEMENT Right 10/27/2016  ? Procedure: INSERTION PORT-A-CATH;  Surgeon: Nestor Lewandowsky, MD;  Location: [facility] ORS;  Service: General;  Laterality: Right;  ? TONSILECTOMY, ADENOIDECTOMY, BILATERAL MYRINGOTOMY AND TUBES    ? TONSILLECTOMY    ? TOTAL MASTECTOMY Left 04/14/2017  ? Procedure: TOTAL MASTECTOMY;  Surgeon: Olean Ree, MD;  Location: [facility] ORS;  Service: General;  Laterality: Left;  ? ? ?There were no vitals filed for this visit. ? ? Subjective Assessment - 10/30/21 1408   ? ? Subjective  Pt reports "I have been doing everything she told me to do.  My sleeve is getting too tight."  Pt indicating her daytime sleeve is over a year old.   ? Pertinent History Per chart review:  Pt is s/p total mastectomy 04/14/2017.  Completed neoadjuvant Taxotere, carbo, Herceptin and Perjeta on 02/17/2017.  Completed 1 year of Herceptin on 11/03/2017.  Would not benefit from aromatase inhibitor.Pt cellulitis started 06/03/18 in R forearm - did 3 rounds of antibiotics - appear last one improved symptoms - DVT Doppler was negative then -  she was seen since then 2 x to replace compression.  10/2021 pt reports she has not had any more episodes of cellulitis but feels her arm has continued to increase in size over time.  She reports being compliant with daytime and nighttime compression wear.   ? Patient Stated Goals I want my arm to get better and not have to wrap.   ? Currently in Pain? No/denies   ? Multiple Pain Sites No   ? ?  ?  ? ?  ? ? ? ? [facility] OT Assessment - 10/30/21 1412   ? ?  ? Assessment  ? Medical Diagnosis LUE lymphedema   ? Onset Date/Surgical Date 04/14/17   ? Hand Dominance Right   ? Prior Therapy seen for multiple episodes  since 2019   ?  ? Precautions  ? Precaution Comments lymphedema left UE   ?  ? Balance Screen  ? Has the patient fallen in the past 6 months No   ?  ? Home  Environment  ? Lives With Family   ?  ? Prior Function  ? Vocation Retired   ?  ? ADL  ? ADL comments Pt reports she is independent with all self care tasks, she participates in gardening, goes on walks daily.   ?  ? Written Expression  ? Dominant Hand Right   ?  ? Observation/Other Assessments  ? Focus on Therapeutic Outcomes (FOTO)  77   pt scored higher on FOTO than expected score  ?  ? Sensation  ? Light Touch Appears Intact   ? Hot/Cold Appears Intact   ? Proprioception Appears Intact   ? ?  ?  ? ?  ? ? ? LYMPHEDEMA/ONCOLOGY QUESTIONNAIRE - 10/30/21 0915   ? ?  ? Surgeries  ? Mastectomy Date 04/14/17   ?  ? Left Upper Extremity Lymphedema  ? 15 cm Proximal to Olecranon Process 34.5 cm   ? 10 cm Proximal to Olecranon Process 36.4 cm   ? Olecranon Process 30.4 cm   ? 15 cm Proximal to Ulnar Styloid Process 32.2 cm   ? 10 cm Proximal to Ulnar Styloid Process 29.2 cm   ? Just Proximal to Ulnar Styloid Process 20.2 cm   ? Across Hand at [employer] 19 cm   ? At [facility] of 2nd Digit 6.3 cm   ? At [facility] of Thumb 6.3 cm   ? ?  ?  ? ?  ? ?Pt arrived late for evaluation this date, time was limited since there was another patient scheduled behind her.   ? ?Pt seen for assessment of LUE lymphedema with circumferential measurements with comparison to measurements last taken during OT screen recently at the [facility].  ? ?Pt demonstrates decreases in measurements in select areas but still has overall increase in LUE lymphedema.  Pt reports her daytime sleeve is at least one year old however it appears it could be at least 83 years old and should have been replaced long ago which is likely the reason for increases over time in her LUE.  Pt voiced concern over potential use of compression wrapping , reports she does not like it, feels it is too hot especially for this  time of year and does not want to do any compression wrapping at this time.  Discussed with patient the risk of not managing lymphedema and impact on LUE without compression wrapping.  Since she did show some changes with circumferential measurements she feels she  would like to continue to try with the tools she has currently.   ? ?Reviewed her current tools:  daytime sleeve/glove (old and does not have optimal fit to hold patient)-needs a new sleeve and understands if she were to get fitted for one now, it would only work to hold her at the size she currently is.  She also understands that by attempting decongestion therapy it will help decrease edema and prepare the arm for optimal fitting of new compression garment.  ?Compression pump:  recommend she use 2 times a day ?Tribute night time garments:  pt able to demonstrate donning and doffing, adjusted some of the velcro tabs ? ?Recommended for the next 2 weeks: ?Use of tribute during the day for decongestion- remove to perform self care, any IADL in which she needs to use the hand and to remove for use of pump 2 times a day. ?Wear tribute at night while sleeping.  ?Minimize times without compression over the next 2 weeks ?Follow up with lymphedema therapist in 2 weeks for measurements ? ?Pt demonstrates understanding that if measurements do not to continue to trend down, she will need to reconsider compression bandaging. ?Fit for new daytime sleeve once LUE has progressed. ? ? ? ? ? ? ? ? ? ? ? ? OT Education - 10/30/21 1422   ? ? Education Details use of night time tribute during the day and night for compression temporarily to see if continued decongestion occurs.  Discussed the benefits of compression wrapping for CDT prior to fitting for new daytime sleeve.   ? Person(s) Educated Patient   ? Methods Explanation   ? Comprehension Verbalized understanding   ? ?  ?  ? ?  ? ? ? OT Short Term Goals - 10/30/21 1428   ? ?  ? OT SHORT TERM GOAL #1  ? Title Pt will be  independent with home management program for lymphedema.   ? Baseline pt familiar with use of compression, exercises for home program but requires some adjustments to program to ensure compliance.   ? Time

## 2021-11-13 ENCOUNTER — Ambulatory Visit: Payer: Medicare Other | Admitting: Occupational Therapy

## 2021-11-13 DIAGNOSIS — I972 Postmastectomy lymphedema syndrome: Secondary | ICD-10-CM | POA: Diagnosis not present

## 2021-11-13 NOTE — Therapy (Signed)
[place] [facility] ?[address] ?[place], [place], [address] ?Phone: [phone]   Fax:  [phone] ? ?Occupational Therapy Treatment ? ?Patient Details  ?Name: Markita Stcharles ?MRN: [id number] ?Date of Birth: Nov 26, 1940 ?No data recorded ? ?Encounter Date: 11/13/2021 ? ? OT End of Session - 11/13/21 0914   ? ? Visit Number 2   ? Number of Visits 6   ? Date for OT Re-Evaluation 12/11/21   ? OT Start Time 0820   ? OT Stop Time 0845   ? OT Time Calculation (min) 25 min   ? Activity Tolerance Patient tolerated treatment well   ? Behavior During Therapy [facility] for tasks assessed/performed   ? ?  ?  ? ?  ? ? ?Past Medical History:  ?Diagnosis Date  ? Anemia   ? Breast cancer ([place]) 2018  ? Left  ? Breast mass, left 09/22/2016  ? RECOMMENDATION: Ultrasound-guided core biopsies of masses at left breast 2 o'clock 2 cm from nipple, left breast 2 o'clock 5 cm from nipple, abnormal left axillary lymph nodes.  ? COPD (chronic obstructive pulmonary disease) ([place])   ? Depression   ? GERD (gastroesophageal reflux disease)   ? History of chickenpox   ? Hx of adenomatous colonic polyps   ? Hyperglycemia   ? Hyperlipidemia   ? Obesity   ? Personal history of chemotherapy 2018  ? Left breast  ? Personal history of radiation therapy 2018  ? Postmenopausal   ? Shingles   ? right eye   ? ? ?Past Surgical History:  ?Procedure Laterality Date  ? ABDOMINAL HYSTERECTOMY    ? AXILLARY SENTINEL NODE BIOPSY Left 04/14/2017  ? Procedure: AXILLARY SENTINEL NODE BIOPSY;  Surgeon: Olean Ree, MD;  Location: [facility] ORS;  Service: General;  Laterality: Left;  ? BREAST BIOPSY Left 09/30/2016  ? US biopsy of 3 areas + chemo  ? BREAST EXCISIONAL BIOPSY Left 04/14/2017  ? Left breast invasive cancer left mastectomy  ? COLONOSCOPY WITH PROPOFOL N/A 06/17/2020  ? Procedure: COLONOSCOPY WITH PROPOFOL;  Surgeon: Toledo, Benay Pike, MD;  Location: [facility] ENDOSCOPY;  Service: Gastroenterology;  Laterality: N/A;  ?  ESOPHAGOGASTRODUODENOSCOPY (EGD) WITH PROPOFOL N/A 12/24/2014  ? Procedure: ESOPHAGOGASTRODUODENOSCOPY (EGD) WITH PROPOFOL;  Surgeon: Manya Silvas, MD;  Location: [facility] ENDOSCOPY;  Service: Endoscopy;  Laterality: N/A;  ? MASTECTOMY Left 04/14/2017  ? Left breast invasive cancer  ? PORTACATH PLACEMENT Right 10/27/2016  ? Procedure: INSERTION PORT-A-CATH;  Surgeon: Nestor Lewandowsky, MD;  Location: [facility] ORS;  Service: General;  Laterality: Right;  ? TONSILECTOMY, ADENOIDECTOMY, BILATERAL MYRINGOTOMY AND TUBES    ? TONSILLECTOMY    ? TOTAL MASTECTOMY Left 04/14/2017  ? Procedure: TOTAL MASTECTOMY;  Surgeon: Olean Ree, MD;  Location: [facility] ORS;  Service: General;  Laterality: Left;  ? ? ?There were no vitals filed for this visit. ? ? Subjective Assessment - 11/13/21 0821   ? ? Subjective  I did like she told me I used the pump twice I wore the back sleeve when I watching TV and when I go out I do both my old sleeves on my arm.  I am so tired of this.   ? Pertinent History Per chart review:  Pt is s/p total mastectomy 04/14/2017.  Completed neoadjuvant Taxotere, carbo, Herceptin and Perjeta on 02/17/2017.  Completed 1 year of Herceptin on 11/03/2017.  Would not benefit from aromatase inhibitor.Pt cellulitis started 06/03/18 in R forearm - did 3 rounds of antibiotics - appear  last one improved symptoms - DVT Doppler was negative then - she was seen since then 2 x to replace compression.  10/2021 pt reports she has not had any more episodes of cellulitis but feels her arm has continued to increase in size over time.  She reports being compliant with daytime and nighttime compression wear.   ? Patient Stated Goals I want my arm to get better and not have to wrap.   ? Currently in Pain? No/denies   ? ?  ?  ? ?  ? ? ? ? ? ? LYMPHEDEMA/ONCOLOGY QUESTIONNAIRE - 11/13/21 0001   ? ?  ? Right Upper Extremity Lymphedema  ? 15 cm Proximal to Olecranon Process 34.5 cm   ? 10 cm Proximal to Olecranon Process 33 cm   ? Olecranon Process 26.5  cm   ?  ? Left Upper Extremity Lymphedema  ? 15 cm Proximal to Olecranon Process 35 cm   ? 10 cm Proximal to Olecranon Process 37 cm   ? Olecranon Process 29.5 cm   ? 15 cm Proximal to Ulnar Styloid Process 31.4 cm   ? 10 cm Proximal to Ulnar Styloid Process 28 cm   ? Just Proximal to Ulnar Styloid Process 18 cm   ? ?  ?  ? ?  ? ? ?Pt seen for assessment of LUE lymphedema with circumferential measurements with comparison to measurements last 2 times  taken during OT screen recently at the [facility] and eval.  ?  ?Pt demonstrates decreases in measurements in  forearm more than upper arm. Still has overall increase in LUE lymphedema compare to 2021 when seen last.  Pt reports her daytime sleeve is at least one year old however it appears it could be at least 83 years old and should have been replaced long ago which is likely the reason for increases over time in her LUE.  Pt voiced concern over potential use of compression wrapping still, reports she does not like it, feels it is too hot especially for this time of year and does not want to do any compression wrapping at this time.  Discussed with patient the risk of not managing lymphedema and impact on LUE without compression wrapping.  Since she did show some changes with circumferential measurements she feels she would like to continue to try with the tools she has currently.   ?  ?Reviewed her current tools:  daytime sleeve/glove (old and does not have optimal fit to hold patient)-needs a new sleeve and understands if she were to get fitted for one now, it would only work to hold her at the size she currently is.  She also understands that by attempting decongestion therapy it will help decrease edema and prepare the arm for optimal fitting of new compression garment.  ?Compression pump:  recommend she use 2 times a day ?Tribute night time garments:  Pt to came back tomorrow for OT to reassess her wearing and compression - if she is doing gradient with her  straps- forearm doing better decongesting than upper arm ?  ?Recommended for the next 2 weeks but came in for night time garments assessment and education tomorrow: ?Use of tribute during the day for decongestion- remove to perform self care, any IADL in which she needs to use the hand and to remove for use of pump 2 times a day. ?Wear tribute at night while sleeping.  ?Minimize times without compression over the next 2 weeks ?Follow up with lymphedema therapist  in 2 weeks for measurements ?  ?Pt demonstrates understanding that if measurements do not to continue to trend down, she will need to reconsider compression bandaging. ?Fit for new daytime sleeve once LUE has progressed. ?  ?  ?  ? ? ? ? ? ? ? ? ? ? ? ? ? ? ? OT Education - 11/13/21 0913   ? ? Education Details use of night time tribute during the day and night for compression temporarily to see if continued decongestion occurs.  Discussed the benefits of compression wrapping for CDT prior to fitting for new daytime sleeve.   ? Person(s) Educated Patient   ? Methods Explanation   ? Comprehension Verbalized understanding   ? ?  ?  ? ?  ? ? ? OT Short Term Goals - 10/30/21 1428   ? ?  ? OT SHORT TERM GOAL #1  ? Title Pt will be independent with home management program for lymphedema.   ? Baseline pt familiar with use of compression, exercises for home program but requires some adjustments to program to ensure compliance.   ? Time 3   ? Period Weeks   ? Status New   ? Target Date 11/20/21   ? ?  ?  ? ?  ? ? ? ? OT Long Term Goals - 10/30/21 1431   ? ?  ? OT LONG TERM GOAL #1  ? Title Pt will be independent with wear and care of new daytime compression sleeve and glove.   ? Baseline pt in need of new garment for daytime   ? Time 6   ? Period Weeks   ? Status New   ? Target Date 12/11/21   ?  ? OT LONG TERM GOAL #2  ? Title Pt to demonstrate a decrease in LUE measurements at all levels to decrease the risk of cellulitis and prepare the arm for fitting of new  daytime sleeve.   ? Baseline current sleeve is approx 83 years old   ? Time 6   ? Period Weeks   ? Status New   ? Target Date 12/11/21   ? ?  ?  ? ?  ? ? ? ? ? ? ? ? Plan - 11/13/21 0914   ? ? Clinical Impre

## 2021-11-14 ENCOUNTER — Ambulatory Visit: Payer: Medicare Other | Admitting: Occupational Therapy

## 2021-11-14 DIAGNOSIS — I972 Postmastectomy lymphedema syndrome: Secondary | ICD-10-CM | POA: Diagnosis not present

## 2021-11-14 NOTE — Therapy (Signed)
[place] [facility] ?[address] ?[place], [place], [address] ?Phone: [phone]   Fax:  [phone] ? ?Occupational Therapy Treatment ? ?Patient Details  ?Name: Maria Patterson ?MRN: [id number] ?Date of Birth: 06/16/1941 ?No data recorded ? ?Encounter Date: 11/14/2021 ? ? OT End of Session - 11/14/21 0957   ? ? Visit Number 3   ? Number of Visits 6   ? Date for OT Re-Evaluation 12/11/21   ? OT Start Time 8341   ? OT Stop Time 9622   ? OT Time Calculation (min) 25 min   ? Activity Tolerance Patient tolerated treatment well   ? Behavior During Therapy [facility] for tasks assessed/performed   ? ?  ?  ? ?  ? ? ?Past Medical History:  ?Diagnosis Date  ? Anemia   ? Breast cancer ([place]) 2018  ? Left  ? Breast mass, left 09/22/2016  ? RECOMMENDATION: Ultrasound-guided core biopsies of masses at left breast 2 o'clock 2 cm from nipple, left breast 2 o'clock 5 cm from nipple, abnormal left axillary lymph nodes.  ? COPD (chronic obstructive pulmonary disease) ([place])   ? Depression   ? GERD (gastroesophageal reflux disease)   ? History of chickenpox   ? Hx of adenomatous colonic polyps   ? Hyperglycemia   ? Hyperlipidemia   ? Obesity   ? Personal history of chemotherapy 2018  ? Left breast  ? Personal history of radiation therapy 2018  ? Postmenopausal   ? Shingles   ? right eye   ? ? ?Past Surgical History:  ?Procedure Laterality Date  ? ABDOMINAL HYSTERECTOMY    ? AXILLARY SENTINEL NODE BIOPSY Left 04/14/2017  ? Procedure: AXILLARY SENTINEL NODE BIOPSY;  Surgeon: Olean Ree, MD;  Location: [facility] ORS;  Service: General;  Laterality: Left;  ? BREAST BIOPSY Left 09/30/2016  ? US biopsy of 3 areas + chemo  ? BREAST EXCISIONAL BIOPSY Left 04/14/2017  ? Left breast invasive cancer left mastectomy  ? COLONOSCOPY WITH PROPOFOL N/A 06/17/2020  ? Procedure: COLONOSCOPY WITH PROPOFOL;  Surgeon: Toledo, Benay Pike, MD;  Location: [facility] ENDOSCOPY;  Service: Gastroenterology;  Laterality: N/A;  ?  ESOPHAGOGASTRODUODENOSCOPY (EGD) WITH PROPOFOL N/A 12/24/2014  ? Procedure: ESOPHAGOGASTRODUODENOSCOPY (EGD) WITH PROPOFOL;  Surgeon: Manya Silvas, MD;  Location: [facility] ENDOSCOPY;  Service: Endoscopy;  Laterality: N/A;  ? MASTECTOMY Left 04/14/2017  ? Left breast invasive cancer  ? PORTACATH PLACEMENT Right 10/27/2016  ? Procedure: INSERTION PORT-A-CATH;  Surgeon: Nestor Lewandowsky, MD;  Location: [facility] ORS;  Service: General;  Laterality: Right;  ? TONSILECTOMY, ADENOIDECTOMY, BILATERAL MYRINGOTOMY AND TUBES    ? TONSILLECTOMY    ? TOTAL MASTECTOMY Left 04/14/2017  ? Procedure: TOTAL MASTECTOMY;  Surgeon: Olean Ree, MD;  Location: [facility] ORS;  Service: General;  Laterality: Left;  ? ? ?There were no vitals filed for this visit. ? ? Subjective Assessment - 11/14/21 0955   ? ? Subjective  I brought the night sleeve in for you to look at.  I wore it yesterday and took it off this morning - I just pull it to the old tabs on there from 81 yrs ago   ? Pertinent History Per chart review:  Pt is s/p total mastectomy 04/14/2017.  Completed neoadjuvant Taxotere, carbo, Herceptin and Perjeta on 02/17/2017.  Completed 1 year of Herceptin on 11/03/2017.  Would not benefit from aromatase inhibitor.Pt cellulitis started 06/03/18 in R forearm - did 3 rounds of antibiotics - appear last one improved symptoms -  DVT Doppler was negative then - she was seen since then 2 x to replace compression.  10/2021 pt reports she has not had any more episodes of cellulitis but feels her arm has continued to increase in size over time.  She reports being compliant with daytime and nighttime compression wear.   ? Patient Stated Goals I want my arm to get better and not have to wrap.   ? Currently in Pain? No/denies   ? ?  ?  ? ?  ? ? ? ? ? ? ?  Requested for patient to come in today and bring her tribute sleeve to reassess compression and fastening for gradient decompression of left upper extremity.  Still has overall increase in LUE lymphedema compare to  2021 when seen last.  Pt reports her daytime sleeve is at least 83 year old however it appears it could be at least 83 years old and should have been replaced long ago which is likely the reason for increases over time in her LUE.  Pt voiced concern over potential use of compression wrapping still, reports she does not like it, feels it is too hot especially for this time of year and does not want to do any compression wrapping at this time.  Discussed with patient the risk of not managing lymphedema and impact on LUE without compression wrapping.  Since she did show some changes with circumferential measurements she feels she would like to continue to try with the tools she has currently.   ?  ?Reviewed her current tools:  daytime sleeve/glove (old and does not have optimal fit to hold patient)-needs a new sleeve and understands if she were to get fitted for one now, it would only work to hold her at the size she currently is.  She also understands that by attempting decongestion therapy it will help decrease edema and prepare the arm for optimal fitting of new compression garment.  ?Compression pump:  recommend she use 2 times a day ?Tribute night time garments: Patient's was still fastening and adjusting compression same as 81 years ago.  Readjusted patient's straps and provided new tabs for patient to have a gradient compression.  Patient wore it for 10 minutes with great decongestion signs in the forearm into the upper arm.  As well as handpiece.   ?Patient provided with extra tabs in case he comes off.   ? ?  ?Recommended for pt to follow up in week  ?Use of tribute during the day for decongestion- remove to perform self care, any IADL in which she needs to use the hand and to remove for use of pump 2 times a day. ?Wear tribute at night while sleeping.  ?Minimize times without compression over the next 2 weeks ?Follow up with lymphedema therapist in 2 weeks for measurements ?  ?Pt demonstrates understanding  that if measurements do not to continue to trend down, she will need to reconsider compression bandaging. ?Fit for new daytime sleeve once LUE has progressed. ?  ?  ?  ?  ?  ? ? ? ? ? ? ? ? ? ? ? ? ? ? ? ? OT Education - 11/14/21 0956   ? ? Education Details Adjusted tabs on nighttime tributes sleeve for patient to get gradient compression to decongest on   ? Person(s) Educated Patient   ? Methods Explanation;Demonstration   ? Comprehension Verbalized understanding;Returned demonstration   ? ?  ?  ? ?  ? ? ? OT Short Term  Goals - 10/30/21 1428   ? ?  ? OT SHORT TERM GOAL #1  ? Title Pt will be independent with home management program for lymphedema.   ? Baseline pt familiar with use of compression, exercises for home program but requires some adjustments to program to ensure compliance.   ? Time 3   ? Period Weeks   ? Status New   ? Target Date 11/20/21   ? ?  ?  ? ?  ? ? ? ? OT Long Term Goals - 10/30/21 1431   ? ?  ? OT LONG TERM GOAL #1  ? Title Pt will be independent with wear and care of new daytime compression sleeve and glove.   ? Baseline pt in need of new garment for daytime   ? Time 6   ? Period Weeks   ? Status New   ? Target Date 12/11/21   ?  ? OT LONG TERM GOAL #2  ? Title Pt to demonstrate a decrease in LUE measurements at all levels to decrease the risk of cellulitis and prepare the arm for fitting of new daytime sleeve.   ? Baseline current sleeve is approx 83 years old   ? Time 6   ? Period Weeks   ? Status New   ? Target Date 12/11/21   ? ?  ?  ? ?  ? ? ? ? ? ? ? ? Plan - 11/14/21 0957   ? ? Clinical Impression Statement Pt is a 81 yo old female with history of breast cancer with left mastectomy in 2018 and episodes of cellulitis.  Pt has been seen in the lymphedema clinic on several occasions over the years and was last scheduled to follow up in April 2022 to replace her day time sleeve.  She has not been seen in this clinic since that time.  She reports an increase in her arm edema over time and  feels her day time sleeve no longer fits well and feels too tight.  She reports she does not want to participate currently in any compression bandaging.  We discussed the benefits and the risks of not eng

## 2021-11-21 ENCOUNTER — Ambulatory Visit: Payer: Medicare Other | Attending: Oncology | Admitting: Occupational Therapy

## 2021-11-21 DIAGNOSIS — I972 Postmastectomy lymphedema syndrome: Secondary | ICD-10-CM | POA: Insufficient documentation

## 2021-11-21 NOTE — Therapy (Signed)
[place] [facility] ?[address] ?[place], [place], [address] ?Phone: [phone]   Fax:  [phone] ? ?Occupational Therapy Treatment ? ?Patient Details  ?Name: Maria Patterson ?MRN: [id number] ?Date of Birth: 1941-06-06 ?No data recorded ? ?Encounter Date: 11/21/2021 ? ? OT End of Session - 11/21/21 5852   ? ? Visit Number 4   ? Number of Visits 6   ? Date for OT Re-Evaluation 12/11/21   ? OT Start Time 0830   ? OT Stop Time 0859   ? OT Time Calculation (min) 29 min   ? Activity Tolerance Patient tolerated treatment well   ? Behavior During Therapy [facility] for tasks assessed/performed   ? ?  ?  ? ?  ? ? ?Past Medical History:  ?Diagnosis Date  ? Anemia   ? Breast cancer ([place]) 2018  ? Left  ? Breast mass, left 09/22/2016  ? RECOMMENDATION: Ultrasound-guided core biopsies of masses at left breast 2 o'clock 2 cm from nipple, left breast 2 o'clock 5 cm from nipple, abnormal left axillary lymph nodes.  ? COPD (chronic obstructive pulmonary disease) ([place])   ? Depression   ? GERD (gastroesophageal reflux disease)   ? History of chickenpox   ? Hx of adenomatous colonic polyps   ? Hyperglycemia   ? Hyperlipidemia   ? Obesity   ? Personal history of chemotherapy 2018  ? Left breast  ? Personal history of radiation therapy 2018  ? Postmenopausal   ? Shingles   ? right eye   ? ? ?Past Surgical History:  ?Procedure Laterality Date  ? ABDOMINAL HYSTERECTOMY    ? AXILLARY SENTINEL NODE BIOPSY Left 04/14/2017  ? Procedure: AXILLARY SENTINEL NODE BIOPSY;  Surgeon: Olean Ree, MD;  Location: [facility] ORS;  Service: General;  Laterality: Left;  ? BREAST BIOPSY Left 09/30/2016  ? US biopsy of 3 areas + chemo  ? BREAST EXCISIONAL BIOPSY Left 04/14/2017  ? Left breast invasive cancer left mastectomy  ? COLONOSCOPY WITH PROPOFOL N/A 06/17/2020  ? Procedure: COLONOSCOPY WITH PROPOFOL;  Surgeon: Toledo, Benay Pike, MD;  Location: [facility] ENDOSCOPY;  Service: Gastroenterology;  Laterality: N/A;  ?  ESOPHAGOGASTRODUODENOSCOPY (EGD) WITH PROPOFOL N/A 12/24/2014  ? Procedure: ESOPHAGOGASTRODUODENOSCOPY (EGD) WITH PROPOFOL;  Surgeon: Manya Silvas, MD;  Location[facility] ENDOSCOPY;  Service: Endoscopy;  Laterality: N/A;  ? MASTECTOMY Left 04/14/2017  ? Left breast invasive cancer  ? PORTACATH PLACEMENT Right 10/27/2016  ? Procedure: INSERTION PORT-A-CATH;  Surgeon: Nestor Lewandowsky, MD;  Location: [facility] ORS;  Service: General;  Laterality: Right;  ? TONSILECTOMY, ADENOIDECTOMY, BILATERAL MYRINGOTOMY AND TUBES    ? TONSILLECTOMY    ? TOTAL MASTECTOMY Left 04/14/2017  ? Procedure: TOTAL MASTECTOMY;  Surgeon: Olean Ree, MD;  Location: [facility] ORS;  Service: General;  Laterality: Left;  ? ? ?There were no vitals filed for this visit. ? ? Subjective Assessment - 11/21/21 0833   ? ? Subjective  I did everything you told me to do.  Putting bosentan once on during the day for go out otherwise aware that nighttime 1 to use the pump to   ? Pertinent History Per chart review:  Pt is s/p total mastectomy 04/14/2017.  Completed neoadjuvant Taxotere, carbo, Herceptin and Perjeta on 02/17/2017.  Completed 1 year of Herceptin on 11/03/2017.  Would not benefit from aromatase inhibitor.Pt cellulitis started 06/03/18 in R forearm - did 3 rounds of antibiotics - appear last one improved symptoms - DVT Doppler was negative then - she  was seen since then 2 x to replace compression.  10/2021 pt reports she has not had any more episodes of cellulitis but feels her arm has continued to increase in size over time.  She reports being compliant with daytime and nighttime compression wear.   ? Patient Stated Goals I want my arm to get better and not have to wrap.   ? Currently in Pain? No/denies   ? ?  ?  ? ?  ? ? ? ? ? ? LYMPHEDEMA/ONCOLOGY QUESTIONNAIRE - 11/21/21 0001   ? ?  ? Right Upper Extremity Lymphedema  ? 15 cm Proximal to Olecranon Process 34.5 cm   ? 10 cm Proximal to Olecranon Process 33 cm   ? Olecranon Process 26 cm   ? 15 cm Proximal to  Ulnar Styloid Process 25.4 cm   ? 10 cm Proximal to Ulnar Styloid Process 21.5 cm   ? Just Proximal to Ulnar Styloid Process 17 cm   ?  ? Left Upper Extremity Lymphedema  ? 15 cm Proximal to Olecranon Process 34 cm   ? 10 cm Proximal to Olecranon Process 36 cm   ? Olecranon Process 29.5 cm   ? 15 cm Proximal to Ulnar Styloid Process 31 cm   ? 10 cm Proximal to Ulnar Styloid Process 27.5 cm   ? Just Proximal to Ulnar Styloid Process 17.5 cm   ? Across Hand at [employer] 19 cm   ? ?  ?  ? ?  ? ? ? ?Pt seen for assessment of LUE lymphedema with circumferential measurements with comparison to right upper extremity but also compare it to what she was 2 years ago because she did had a weird weight increase. ? ?Also educated patient on every time she has a relapse or not replacing her compression garments and keeping left upper extremity lymphedema under control is harder to decongest that extremity again.  Her original cellulitis years ago was in the left forearm.  It appears this is the area that she has a hard time decongesting.  She do not want to be bandage so we using her existing garments but wearing nighttime garment more.  Using pumps twice and she is wearing both her [employer] Elvarex soft. ?This date she still increased by 5.5 to 6 cm in the forearm compared to the right.  Compared to 2 years ago she was 3 to 4 cm increase. ?Did add a short stretch 8 cm bandage this date for patient to wear over daytime and nighttime compression the next week starting at the wrist going 1 time through the webspace over the hand and then circular bandaging up to elbow. ?Patient reports it felt good and she verbalized understanding what to do. ? ?Pt voiced concern over potential use of compression wrapping still, reports she does not like it, feels it is too hot especially for this time of year and does not want to do any compression wrapping at this time.  Discussed with patient the risk of not managing lymphedema and impact on  LUE without compression wrapping.  Since she continues to show some changes with circumferential measurements she feels she would like to continue to try with the tools she has currently.   ?  ?Pt demonstrates understanding that if measurements do not to continue to trend down, she will need to reconsider compression bandaging. ?Fit for new daytime sleeve once LUE has decongest  ?  ?  ?  ?  ? ? ? ? ? ? ? ? ? ? ? ? ? ?  OT Education - 11/21/21 9357   ? ? Education Details Adjusted tabs on nighttime tributes sleeve last visit and today added short stretch bandage to hand and forearm to be done over garments.   ? Person(s) Educated Patient   ? Methods Explanation;Demonstration   ? Comprehension Verbalized understanding;Returned demonstration   ? ?  ?  ? ?  ? ? ? OT Short Term Goals - 10/30/21 1428   ? ?  ? OT SHORT TERM GOAL #1  ? Title Pt will be independent with home management program for lymphedema.   ? Baseline pt familiar with use of compression, exercises for home program but requires some adjustments to program to ensure compliance.   ? Time 3   ? Period Weeks   ? Status New   ? Target Date 11/20/21   ? ?  ?  ? ?  ? ? ? ? OT Long Term Goals - 10/30/21 1431   ? ?  ? OT LONG TERM GOAL #1  ? Title Pt will be independent with wear and care of new daytime compression sleeve and glove.   ? Baseline pt in need of new garment for daytime   ? Time 6   ? Period Weeks   ? Status New   ? Target Date 12/11/21   ?  ? OT LONG TERM GOAL #2  ? Title Pt to demonstrate a decrease in LUE measurements at all levels to decrease the risk of cellulitis and prepare the arm for fitting of new daytime sleeve.   ? Baseline current sleeve is approx 83 years old   ? Time 6   ? Period Weeks   ? Status New   ? Target Date 12/11/21   ? ?  ?  ? ?  ? ? ? ? ? ? ? ? Plan - 11/21/21 0833   ? ? Clinical Impression Statement Pt is a 81 yo old female with history of breast cancer with left mastectomy in 2018 and episodes of cellulitis.  Pt has been seen  in the lymphedema clinic on several occasions over the years and was last scheduled to follow up in April 2022 to replace her day time sleeve.  She has not been seen in this clinic since that time.  Sh

## 2021-11-28 ENCOUNTER — Ambulatory Visit: Payer: Medicare Other | Admitting: Occupational Therapy

## 2021-11-28 DIAGNOSIS — I972 Postmastectomy lymphedema syndrome: Secondary | ICD-10-CM

## 2021-11-28 NOTE — Therapy (Signed)
[facility] ?[address] ?[place], [place], [address] ?Phone: [phone]   Fax:  [phone] ? ?Occupational Therapy Treatment ? ?Patient Details  ?Name: Maria Patterson ?MRN: [id number] ?Date of Birth: [birth year] ?No data recorded ? ?Encounter Date: 11/28/2021 ? ? OT End of Session - 11/28/21 1122   ? ? Visit Number 5   ? Number of Visits 6   ? Date for OT Re-Evaluation 12/11/21   ? OT Start Time 1040   ? OT Stop Time 1105   ? OT Time Calculation (min) 25 min   ? Activity Tolerance Patient tolerated treatment well   ? Behavior During Therapy [facility] for tasks assessed/performed   ? ?  ?  ? ?  ? ? ?Past Medical History:  ?Diagnosis Date  ? Anemia   ? Breast cancer ([place]) 2018  ? Left  ? Breast mass, left 09/22/2016  ? RECOMMENDATION: Ultrasound-guided core biopsies of masses at left breast 2 o'clock 2 cm from nipple, left breast 2 o'clock 5 cm from nipple, abnormal left axillary lymph nodes.  ? COPD (chronic obstructive pulmonary disease) ([facility])   ? Depression   ? GERD (gastroesophageal reflux disease)   ? History of chickenpox   ? Hx of adenomatous colonic polyps   ? Hyperglycemia   ? Hyperlipidemia   ? Obesity   ? Personal history of chemotherapy 2018  ? Left breast  ? Personal history of radiation therapy 2018  ? Postmenopausal   ? Shingles   ? right eye   ? ? ?Past Surgical History:  ?Procedure Laterality Date  ? ABDOMINAL HYSTERECTOMY    ? AXILLARY SENTINEL NODE BIOPSY Left 04/14/2017  ? Procedure: AXILLARY SENTINEL NODE BIOPSY;  Surgeon: Olean Ree, MD;  Location: [facility] ORS;  Service: General;  Laterality: Left;  ? BREAST BIOPSY Left 09/30/2016  ? US biopsy of 3 areas + chemo  ? BREAST EXCISIONAL BIOPSY Left 04/14/2017  ? Left breast invasive cancer left mastectomy  ? COLONOSCOPY WITH PROPOFOL N/A 06/17/2020  ? Procedure: COLONOSCOPY WITH PROPOFOL;  Surgeon: Toledo, Benay Pike, MD;  Location: [facility] ENDOSCOPY;  Service: Gastroenterology;  Laterality: N/A;  ?  ESOPHAGOGASTRODUODENOSCOPY (EGD) WITH PROPOFOL N/A 12/24/2014  ? Procedure: ESOPHAGOGASTRODUODENOSCOPY (EGD) WITH PROPOFOL;  Surgeon: Manya Silvas, MD;  Location: [facility] ENDOSCOPY;  Service: Endoscopy;  Laterality: N/A;  ? MASTECTOMY Left 04/14/2017  ? Left breast invasive cancer  ? PORTACATH PLACEMENT Right 10/27/2016  ? Procedure: INSERTION PORT-A-CATH;  Surgeon: Nestor Lewandowsky, MD;  Location: [facility] ORS;  Service: General;  Laterality: Right;  ? TONSILECTOMY, ADENOIDECTOMY, BILATERAL MYRINGOTOMY AND TUBES    ? TONSILLECTOMY    ? TOTAL MASTECTOMY Left 04/14/2017  ? Procedure: TOTAL MASTECTOMY;  Surgeon: Olean Ree, MD;  Location: [facility] ORS;  Service: General;  Laterality: Left;  ? ? ?There were no vitals filed for this visit. ? ? Subjective Assessment - 11/28/21 1121   ? ? Subjective  I have done everything you told me to do.  I done the bandage over the nighttime and daytime sleeve up to my elbow.  But I think I pulled the night last too tight at the wrist last night.   ? Pertinent History Per chart review:  Pt is s/p total mastectomy 04/14/2017.  Completed neoadjuvant Taxotere, carbo, Herceptin and Perjeta on 02/17/2017.  Completed 1 year of Herceptin on 11/03/2017.  Would not benefit from aromatase inhibitor.Pt cellulitis started 06/03/18 in R forearm - did 3 rounds of antibiotics - appear last  one improved symptoms - DVT Doppler was negative then - she was seen since then 2 x to replace compression.  10/2021 pt reports she has not had any more episodes of cellulitis but feels her arm has continued to increase in size over time.  She reports being compliant with daytime and nighttime compression wear.   ? Patient Stated Goals I want my arm to get better and not have to wrap.   ? Currently in Pain? No/denies   ? ?  ?  ? ?  ? ? ? ? ? ? LYMPHEDEMA/ONCOLOGY QUESTIONNAIRE - 11/28/21 0001   ? ?  ? Left Upper Extremity Lymphedema  ? 15 cm Proximal to Olecranon Process 33.4 cm   ? 10 cm Proximal to Olecranon Process 36 cm   ?  Olecranon Process 28.8 cm   ? 15 cm Proximal to Ulnar Styloid Process 29.5 cm   ? 10 cm Proximal to Ulnar Styloid Process 27 cm   ? Just Proximal to Ulnar Styloid Process 17.8 cm   ? Across Hand at [employer] 19.3 cm   ? ?  ?  ? ?  ? ? ? ?Pt seen for assessment of LUE lymphedema with circumferential measurements with comparison to right upper extremity but also compare it to what she was 2 years ago because she did had a  weight increase. ?  ?Also educated patient on every time she has a relapse or not replacing her compression garments and keeping left upper extremity lymphedema under control is harder to decongest that extremity again.  Her original cellulitis years ago was in the left forearm.  It appears this is the area that she has a hard time decongesting.  She do not want to be bandage so we using her existing garments but wearing nighttime garment more.  Using pumps twice and she is wearing both her Jobst Elvarex soft. ?Add last week short stretch 8 cm bandage for patient to wear over daytime and nighttime compression  starting at the wrist going 1  -3 times through the webspace over the hand and then circular bandaging up to elbow. ?Change this today to 3 thru webspace and figure 8's to elbow ?Patient reports it felt good and she verbalized understanding what to do. Agreement to do another week  ? ?   ?This date she was decrease in forearm to 4.1-5.5 cm compared to the right.  Compared to 2 years ago she was 3 to 4 cm increase. ?DId had some decongesting the last week with bandage added ?Pt voiced concern over potential use of compression wrapping still, reports she does not like it, feels it is too hot especially for this time of year and does not want to do any compression wrapping at this time.  Discussed with patient the risk of not managing lymphedema and impact on LUE without compression wrapping.  Since she continues to show some changes with circumferential measurements she feels she would  like to continue to try with the tools she has currently.   ?  ?Pt demonstrates understanding that if measurements do not to continue to trend down, she will need to reconsider compression bandaging. ?Fit for new daytime sleeve once LUE has decongest  ?  ?  ?  ?  ?  ?  ?  ?  ?  ?  ?  ?  ?  ?  ?  ?  ?  ? ? ? ? ? ? ? ? ? ? ? ? ? ?  OT Education - 11/28/21 1121   ? ? Education Details Adjusted tabs on nighttime tributes sleeve last visit and today added short stretch bandage to hand and forearm to be done over garments.   ? Person(s) Educated Patient   ? Methods Explanation;Demonstration   ? Comprehension Verbalized understanding;Returned demonstration   ? ?  ?  ? ?  ? ? ? OT Short Term Goals - 10/30/21 1428   ? ?  ? OT SHORT TERM GOAL #1  ? Title Pt will be independent with home management program for lymphedema.   ? Baseline pt familiar with use of compression, exercises for home program but requires some adjustments to program to ensure compliance.   ? Time 3   ? Period Weeks   ? Status New   ? Target Date 11/20/21   ? ?  ?  ? ?  ? ? ? ? OT Long Term Goals - 10/30/21 1431   ? ?  ? OT LONG TERM GOAL #1  ? Title Pt will be independent with wear and care of new daytime compression sleeve and glove.   ? Baseline pt in need of new garment for daytime   ? Time 6   ? Period Weeks   ? Status New   ? Target Date 12/11/21   ?  ? OT LONG TERM GOAL #2  ? Title Pt to demonstrate a decrease in LUE measurements at all levels to decrease the risk of cellulitis and prepare the arm for fitting of new daytime sleeve.   ? Baseline current sleeve is approx 83 years old   ? Time 6   ? Period Weeks   ? Status New   ? Target Date 12/11/21   ? ?  ?  ? ?  ? ? ? ? ? ? ? ? Plan - 11/28/21 1122   ? ? Clinical Impression Statement Pt is a 81 yo old female with history of breast cancer with left mastectomy in 2018 and episodes of cellulitis.  Pt has been seen in the lymphedema clinic on several occasions over the years and was last scheduled to  follow up in April 2022 to replace her day time sleeve.  She has not been seen in this clinic since that time.  She reports an increase in her arm edema over time and feels her day time sleeve no longer fit

## 2021-12-04 ENCOUNTER — Ambulatory Visit: Payer: Medicare Other | Admitting: Occupational Therapy

## 2021-12-04 DIAGNOSIS — I972 Postmastectomy lymphedema syndrome: Secondary | ICD-10-CM | POA: Diagnosis not present

## 2021-12-04 NOTE — Therapy (Signed)
[facility] [address] [place], [place], [address] Phone: [phone]   Fax:  [phone]  Occupational Therapy Treatment  Patient Details  Name: Maria Patterson MRN: [id number] Date of Birth: Feb 07, 1941 No data recorded  Encounter Date: 12/04/2021   OT End of Session - 12/04/21 0857     Visit Number 6    Number of Visits 10    Date for OT Re-Evaluation 01/01/22    OT Start Time 0824    OT Stop Time 0848    OT Time Calculation (min) 24 min    Activity Tolerance Patient tolerated treatment well    Behavior During Therapy [facility] for tasks assessed/performed             Past Medical History:  Diagnosis Date   Anemia    Breast cancer ([place]) 2018   Left   Breast mass, left 09/22/2016   RECOMMENDATION: Ultrasound-guided core biopsies of masses at left breast 2 o'clock 2 cm from nipple, left breast 2 o'clock 5 cm from nipple, abnormal left axillary lymph nodes.   COPD (chronic obstructive pulmonary disease) ([facility])    Depression    GERD (gastroesophageal reflux disease)    History of chickenpox    Hx of adenomatous colonic polyps    Hyperglycemia    Hyperlipidemia    Obesity    Personal history of chemotherapy 2018   Left breast   Personal history of radiation therapy 2018   Postmenopausal    Shingles    right eye     Past Surgical History:  Procedure Laterality Date   ABDOMINAL HYSTERECTOMY     AXILLARY SENTINEL NODE BIOPSY Left 04/14/2017   Procedure: AXILLARY SENTINEL NODE BIOPSY;  Surgeon: Olean Ree, MD;  Location: [facility] ORS;  Service: General;  Laterality: Left;   BREAST BIOPSY Left 09/30/2016   US biopsy of 3 areas + chemo   BREAST EXCISIONAL BIOPSY Left 04/14/2017   Left breast invasive cancer left mastectomy   COLONOSCOPY WITH PROPOFOL N/A 06/17/2020   Procedure: COLONOSCOPY WITH PROPOFOL;  Surgeon: Toledo, Benay Pike, MD;  Location: [facility] ENDOSCOPY;  Service: Gastroenterology;  Laterality: N/A;    ESOPHAGOGASTRODUODENOSCOPY (EGD) WITH PROPOFOL N/A 12/24/2014   Procedure: ESOPHAGOGASTRODUODENOSCOPY (EGD) WITH PROPOFOL;  Surgeon: Manya Silvas, MD;  Location: [facility] ENDOSCOPY;  Service: Endoscopy;  Laterality: N/A;   MASTECTOMY Left 04/14/2017   Left breast invasive cancer   PORTACATH PLACEMENT Right 10/27/2016   Procedure: INSERTION PORT-A-CATH;  Surgeon: Nestor Lewandowsky, MD;  Location: [facility] ORS;  Service: General;  Laterality: Right;   TONSILECTOMY, ADENOIDECTOMY, BILATERAL MYRINGOTOMY AND TUBES     TONSILLECTOMY     TOTAL MASTECTOMY Left 04/14/2017   Procedure: TOTAL MASTECTOMY;  Surgeon: Olean Ree, MD;  Location: [facility] ORS;  Service: General;  Laterality: Left;    There were no vitals filed for this visit.   Subjective Assessment - 12/04/21 0855     Subjective  I done the bandage over my nighttime and daytime compression.  But he gets hot at night and then I find myself unwrapping the bandages or stretching it looser around my forearm.  I did not do as good as I did the previous week.  Let me give it another week.    Pertinent History Per chart review:  Pt is s/p total mastectomy 04/14/2017.  Completed neoadjuvant Taxotere, carbo, Herceptin and Perjeta on 02/17/2017.  Completed 1 year of Herceptin on 11/03/2017.  Would not benefit from aromatase inhibitor.Pt cellulitis started 06/03/18  in R forearm - did 3 rounds of antibiotics - appear last one improved symptoms - DVT Doppler was negative then - she was seen since then 2 x to replace compression.  10/2021 pt reports she has not had any more episodes of cellulitis but feels her arm has continued to increase in size over time.  She reports being compliant with daytime and nighttime compression wear.    Patient Stated Goals I want my arm to get better and not have to wrap.    Currently in Pain? No/denies                 LYMPHEDEMA/ONCOLOGY QUESTIONNAIRE - 12/04/21 0001       Left Upper Extremity Lymphedema   15 cm Proximal to  Olecranon Process 33.5 cm    10 cm Proximal to Olecranon Process 36 cm    Olecranon Process 29 cm    15 cm Proximal to Ulnar Styloid Process 30 cm    10 cm Proximal to Ulnar Styloid Process 26 cm    Just Proximal to Ulnar Styloid Process 17.5 cm                Pt seen for assessment of LUE lymphedema with circumferential measurements with comparison to right upper extremity but also compare it to what she was 2 years ago because she did had a  weight increase.   Also educated patient on every time she has a relapse or not replacing her compression garments and keeping left upper extremity lymphedema under control is harder to decongest that extremity again.  Her original cellulitis years ago was in the left forearm.  It appears this is the area that she has a hard time decongesting.  She do not want to be bandage so we using her existing garments but wearing nighttime garment more.  Using pumps twice and she is wearing both her Jobst Elvarex soft. Add  2 wks ago short stretch 8 cm bandage for patient to wear over daytime and nighttime compression  starting at the wrist going 1  -3 times through the webspace over the hand and then change to figure 8's up forearm to elbow and then circular  to mid upper arm.   Patient verbalized that she did not do as good as last week because it was hot at night and she found herself loosening up the forearm bandages.  Reinforced with patient again that it is working the bandaging over her garments because she do not want to be bandage by 3 layer bandage. She was able to maintain progress at elbow and proximal forearm with some good decongestion at distal forearm and wrist at this time. Continue to be increase at forearm to 4.1-5.5 cm compared to the right.  Compared to 2 years ago she was 3 to 4 cm increase. DId had some decongesting the last  2 wks with bandage added Pt voiced concern over potential use of compression wrapping still, reports she does not  like it, feels it is too hot especially for this time of year and does not want to do any compression wrapping at this time.  Discussed with patient the risk of not managing lymphedema and impact on LUE without compression wrapping.  Since she continues to show some changes with circumferential measurements she feels she would like to continue to try with the tools she has currently.     Pt demonstrates understanding that if measurements do not to continue to trend down, she will need  to reconsider compression bandaging. Fit for new daytime sleeve once LUE has decongest                            OT Education - 12/04/21 0857     Education Details short stretch bandage to hand and forearm to be done over garments.    Person(s) Educated Patient    Methods Explanation;Demonstration    Comprehension Verbalized understanding;Returned demonstration              OT Short Term Goals - 10/30/21 1428       OT SHORT TERM GOAL #1   Title Pt will be independent with home management program for lymphedema.    Baseline pt familiar with use of compression, exercises for home program but requires some adjustments to program to ensure compliance.    Time 3    Period Weeks    Status New    Target Date 11/20/21               OT Long Term Goals - 12/04/21 0858       OT LONG TERM GOAL #1   Title Pt will be independent with wear and care of new daytime compression sleeve and glove.    Baseline pt in need of new garment for daytime - no measured yet - decrease 0.4 to 1 cm in upper arm, elbow 1.4 cm  and forearm 2.2 to 3.2 cm - and wrist 2.8 cm - pt    Time 4    Period Weeks    Status On-going    Target Date 01/01/22      OT LONG TERM GOAL #2   Title Pt to demonstrate a decrease in LUE measurements at all levels to decrease the risk of cellulitis and prepare the arm for fitting of new daytime sleeve.    Baseline current sleeve is approx 83 years old - pt is decongesting with   one bandage over night time and daytime compression- do not want to be bandage 3 layer - no decongest to level she was at 2-3 yrs ago compare to R UE    Time 4    Period Weeks    Status On-going    Target Date 01/01/22                   Plan - 12/04/21 0905     Clinical Impression Statement Pt is a 81 yo old female with history of breast cancer with left mastectomy in 2018 and episodes of cellulitis.  Pt has been seen in the lymphedema clinic on several occasions over the years and was last scheduled to follow up in April 2022 to replace her day time sleeve.  She has not been seen in this clinic since that time.  She reports an increase in her arm edema over time and feels her day time sleeve no longer fits well and feels too tight.  She reports she does not want to participate currently in any compression bandaging.  Patient is slowly decongesting left upper extremity..  Using her existing compression garments-she reports wearing both her Jobst Elvarex soft sleeves at the same time when going out.  And as much she night time Tribute compression garments  and using her pump twice a day.  Upper arm and elbow decongested to about the same it was 2 years ago compared to the right upper extremity. 2 wks ago added short stretch bandage 8  cm to hand and forearm to be done over her daytime and nighttime compression garments with great success this date more at distal forearm and wrist- lost about 0.5 cm at forearm- pt report loosening bandage at night when to hot.  Pt wants to cont another week to try and decongest forearm more that is still 4.1 to 5.5 cm  increased  compared to the right where it was 3 to 4 cm 2 years ago.  Pt would benefit from skilled OT services to maximize safety and independence in daily tasks.  Pt would benefit from complete decongestive therapy for the LUE and also fitting for a new daytime sleeve.    OT Occupational Profile and History Detailed Assessment- Review of Records and  additional review of physical, cognitive, psychosocial history related to current functional performance    Occupational performance deficits (Please refer to evaluation for details): ADL's;Leisure;IADL's    Body Structure / Function / Physical Skills ADL;Edema;UE functional use    Rehab Potential Good    Clinical Decision Making Limited treatment options, no task modification necessary    Comorbidities Affecting Occupational Performance: May have comorbidities impacting occupational performance    Modification or Assistance to Complete Evaluation  No modification of tasks or assist necessary to complete eval    OT Frequency 1x / week    OT Duration 6 weeks    OT Treatment/Interventions Self-care/ADL training;Manual lymph drainage;Patient/family education;Compression bandaging;Therapeutic exercise;Manual Therapy    Consulted and Agree with Plan of Care Patient             Patient will benefit from skilled therapeutic intervention in order to improve the following deficits and impairments:   Body Structure / Function / Physical Skills: ADL, Edema, UE functional use       Visit Diagnosis: Postmastectomy lymphedema syndrome    Problem List Patient Active Problem List   Diagnosis Date Noted   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   Cervical arthritis 02/04/2021   History of breast cancer 02/04/2021   Spider vein of lower extremity 12/20/2020   Lymphedema of arm 09/18/2020   Chronic right shoulder pain 09/18/2020   Tubular adenoma 01/18/2020   Osteoporosis 05/12/2019   Anxiety and depression 05/12/2019   Herpes 05/12/2019   Dermatitis 05/12/2019   Colon polyps 03/17/2019   Pre-diabetes 02/19/2019   H/O total mastectomy of left breast 06/03/2018   Obesity (BMI 30.0-34.9) 03/08/2018   Insomnia 09/26/2017   Stage 3b chronic kidney disease ([place]) 08/09/2017   Hypomagnesemia 05/04/2017   Aortic atherosclerosis ([place]) 05/04/2017   Compression fracture of first lumbar  vertebra ([place]) 05/04/2017   Degenerative disc disease, lumbar 05/04/2017   Anemia 02/01/2017   Atherosclerosis of native arteries of extremity with intermittent claudication ([place]) 11/09/2016   Allergic rhinitis 11/07/2016   Malignant neoplasm of upper-outer quadrant of left female breast ([place]) 10/08/2016   Postmenopausal 09/18/2016   Ache in joint 03/09/2016   Gastroesophageal reflux disease 07/01/2012   Hyperlipidemia LDL goal <100 07/01/2012    Rosalyn Gess, OTR/L,CLT 12/04/2021, 9:09 AM  [facility] [address], [place], [address] Phone: [phone]   Fax:  [phone]  Name: Olita Takeshita MRN: [id number] Date of Birth: 02/18/1941

## 2021-12-09 ENCOUNTER — Ambulatory Visit: Payer: Medicare Other | Admitting: Occupational Therapy

## 2021-12-09 DIAGNOSIS — I972 Postmastectomy lymphedema syndrome: Secondary | ICD-10-CM

## 2021-12-09 NOTE — Therapy (Signed)
[facility] [address], [place], [address] Phone: [phone]   Fax:  [phone]  Occupational Therapy Treatment  Patient Details  Name: Maria Patterson MRN: [id number] Date of Birth: [birth year] No data recorded  Encounter Date: 12/09/2021   OT End of Session - 12/09/21 0859     Visit Number 7    Number of Visits 19    Date for OT Re-Evaluation 01/06/22    OT Start Time 0817    OT Stop Time 0855    OT Time Calculation (min) 38 min    Activity Tolerance Patient tolerated treatment well    Behavior During Therapy [facility] for tasks assessed/performed             Past Medical History:  Diagnosis Date   Anemia    Breast cancer ([place]) 2018   Left   Breast mass, left 09/22/2016   RECOMMENDATION: Ultrasound-guided core biopsies of masses at left breast 2 o'clock 2 cm from nipple, left breast 2 o'clock 5 cm from nipple, abnormal left axillary lymph nodes.   COPD (chronic obstructive pulmonary disease) (HCC)    Depression    GERD (gastroesophageal reflux disease)    History of chickenpox    Hx of adenomatous colonic polyps    Hyperglycemia    Hyperlipidemia    Obesity    Personal history of chemotherapy 2018   Left breast   Personal history of radiation therapy 2018   Postmenopausal    Shingles    right eye     Past Surgical History:  Procedure Laterality Date   ABDOMINAL HYSTERECTOMY     AXILLARY SENTINEL NODE BIOPSY Left 04/14/2017   Procedure: AXILLARY SENTINEL NODE BIOPSY;  Surgeon: Olean Ree, MD;  Location: [facility] ORS;  Service: General;  Laterality: Left;   BREAST BIOPSY Left 09/30/2016   US biopsy of 3 areas + chemo   BREAST EXCISIONAL BIOPSY Left 04/14/2017   Left breast invasive cancer left mastectomy   COLONOSCOPY WITH PROPOFOL N/A 06/17/2020   Procedure: COLONOSCOPY WITH PROPOFOL;  Surgeon: Toledo, Benay Pike, MD;  Location: [facility] ENDOSCOPY;  Service: Gastroenterology;  Laterality: N/A;    ESOPHAGOGASTRODUODENOSCOPY (EGD) WITH PROPOFOL N/A 12/24/2014   Procedure: ESOPHAGOGASTRODUODENOSCOPY (EGD) WITH PROPOFOL;  Surgeon: Manya Silvas, MD;  Location: [facility] ENDOSCOPY;  Service: Endoscopy;  Laterality: N/A;   MASTECTOMY Left 04/14/2017   Left breast invasive cancer   PORTACATH PLACEMENT Right 10/27/2016   Procedure: INSERTION PORT-A-CATH;  Surgeon: Nestor Lewandowsky, MD;  Location: [facility] ORS;  Service: General;  Laterality: Right;   TONSILECTOMY, ADENOIDECTOMY, BILATERAL MYRINGOTOMY AND TUBES     TONSILLECTOMY     TOTAL MASTECTOMY Left 04/14/2017   Procedure: TOTAL MASTECTOMY;  Surgeon: Olean Ree, MD;  Location: [facility] ORS;  Service: General;  Laterality: Left;    There were no vitals filed for this visit.   Subjective Assessment - 12/09/21 0858     Subjective  I try to do everything with the bandages over the nighttime in the daytime compression my hand looks swollen little bit.  But that forearm stay swollen I do know what you think    Pertinent History Per chart review:  Pt is s/p total mastectomy 04/14/2017.  Completed neoadjuvant Taxotere, carbo, Herceptin and Perjeta on 02/17/2017.  Completed 1 year of Herceptin on 11/03/2017.  Would not benefit from aromatase inhibitor.Pt cellulitis started 06/03/18 in R forearm - did 3 rounds of antibiotics - appear last one improved symptoms - DVT Doppler was  negative then - she was seen since then 2 x to replace compression.  10/2021 pt reports she has not had any more episodes of cellulitis but feels her arm has continued to increase in size over time.  She reports being compliant with daytime and nighttime compression wear.    Patient Stated Goals I want my arm to get better and not have to wrap.    Currently in Pain? No/denies                 LYMPHEDEMA/ONCOLOGY QUESTIONNAIRE - 12/09/21 0001       Left Upper Extremity Lymphedema   15 cm Proximal to Olecranon Process 32.5 cm    10 cm Proximal to Olecranon Process 36 cm    Olecranon  Process 28.5 cm    15 cm Proximal to Ulnar Styloid Process 30 cm    10 cm Proximal to Ulnar Styloid Process 26 cm    Just Proximal to Ulnar Styloid Process 17.5 cm              Pt seen for assessment of LUE lymphedema with circumferential measurements with comparison to right upper extremity but also compare it to what she was 2 years ago because she did had a  weight increase.   Educated patient on every time she has a relapse or not replacing her compression garments and keeping left upper extremity lymphedema under control is harder to decongest that extremity again.  Her original cellulitis years ago was in the left forearm.  It appears this is the area that she has a hard time decongesting.  Patient was seen this time for 7 visits declining to be bandage wants to try and use existing garments and pump and longer time at her nighttime garment.  Every couple of weeks ago 1 short stretch bandage over her day and nighttime garments to decongest a little bit.       Continue to be increase at forearm to 4.1-5.5 cm compared to the right.  Compared to 2 years ago she was 3 to 4 cm increase. Discussed this date that patient's forearm is not decreasing with the existing garments, wearing the nighttime garment longer and using the pump.  As well as 1 single layer bandage over nighttime and daytime compression.  At this time recommend a 3 layer bandage patient agree.   Done Eucerin lotion on left upper extremity, apply Isotoner glove and stocking it.  Did 1 Rosidal foam from hand up to upper arm .  D 6 cm short stretch bandage from wrist through the hand and wrist 3 times up to mid forearm.  8 cm from wrist 1 time through webspace over hand up to proximal to elbow.  10 cm short stretch from mid forearm circular to upper arm  .  Patient at and reviewed again precautions as well as range of motion exercises.   Patient to keep bandages on for 24 hours and then will do 48 hours patient to bring  cortisone or Benadryl ointment in to apply in volar elbow in case she has some itching.    I Fit for new daytime sleeve once LUE has decongest                        OT Education - 12/09/21 0859     Education Details Bandages precautions as well as home program    Person(s) Educated Patient    Methods Explanation;Demonstration    Comprehension Verbalized understanding;Returned  demonstration              OT Short Term Goals - 12/09/21 0903       OT SHORT TERM GOAL #1   Title Pt will be independent with home management program for lymphedema.    Baseline pt familiar with use of compression, exercises for home program but requires some adjustments to program to ensure compliance.- did do bandage over compreession bandage -but need and agree to 3 layer bandage this date for forearm to decongest    Time 3    Period Weeks    Status On-going    Target Date 12/30/21               OT Long Term Goals - 12/09/21 1034       OT LONG TERM GOAL #1   Title Pt will be independent with wear and care of new daytime compression sleeve and glove.    Baseline pt in need of new garment for daytime - did not decongest in forearm to 3-4cm difference in forearm    Time 4    Period Weeks    Status On-going    Target Date 01/06/22      OT LONG TERM GOAL #2   Title Pt to demonstrate a decrease in LUE measurements at all levels to decrease the risk of cellulitis and prepare the arm for fitting of new daytime sleeve.    Baseline current sleeve is approx 83 years old - pt is decongesting with  one bandage over night time and daytime compression in elbow and upper arm -but forearm still increase more than 4 cm - agree to do 3 layer bandage    Time 4    Period Weeks    Status On-going    Target Date 01/06/22                   Plan - 12/09/21 0900     Clinical Impression Statement Pt is a 81 yo old female with history of breast cancer with left mastectomy in 2018 and  episodes of cellulitis.  Pt has been seen in the lymphedema clinic on several occasions over the years and was last scheduled to follow up in April 2022 to replace her day time sleeve.  She has not been seen in this clinic since that time.  Patient returned this time for 4-13/23 with a flareup of lymphedema after not replacing her daytime compression sleeve patient measurements were significantly up compared to 2 years ago as well as compared to the right arm.  Patient wanted to try to use her existing daytime compression and nighttime with pump to decrease did not want to bandaging.  She showed great decongestion in the upper arm and the hand and wrist.  But forearm continue to stay significantly increased 4.5 cm.  Discussed with patient at this time that we need to try and do bandaging for CDT for a few sessions.  Patient agree bandaging done with Rosidal foam from hand to upper arm with 3 layers of short stretch bandage 6, 8 and 10 cm bandaging patient educated on range of motion exercises and precautions.  Increase patient at this time to 3 times a week until decongested and getting measured for new daytime compression.  She would also need a new power sleeve for her nighttime compression.  Pt would benefit from skilled OT services to maximize safety and independence in daily tasks.  Pt would benefit from complete decongestive therapy for  the LUE and also fitting for a new daytime sleeve.    OT Occupational Profile and History Detailed Assessment- Review of Records and additional review of physical, cognitive, psychosocial history related to current functional performance    Occupational performance deficits (Please refer to evaluation for details): ADL's;Leisure;IADL's    Body Structure / Function / Physical Skills ADL;Edema;UE functional use    Rehab Potential Good    Clinical Decision Making Limited treatment options, no task modification necessary    Comorbidities Affecting Occupational Performance:  May have comorbidities impacting occupational performance    Modification or Assistance to Complete Evaluation  No modification of tasks or assist necessary to complete eval    OT Frequency 3x / week    OT Duration 4 weeks    OT Treatment/Interventions Self-care/ADL training;Manual lymph drainage;Patient/family education;Compression bandaging;Therapeutic exercise;Manual Therapy    Consulted and Agree with Plan of Care Patient             Patient will benefit from skilled therapeutic intervention in order to improve the following deficits and impairments:   Body Structure / Function / Physical Skills: ADL, Edema, UE functional use       Visit Diagnosis: Postmastectomy lymphedema syndrome - Plan: Ot plan of care cert/re-cert    Problem List Patient Active Problem List   Diagnosis Date Noted   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   Cervical arthritis 02/04/2021   History of breast cancer 02/04/2021   Spider vein of lower extremity 12/20/2020   Lymphedema of arm 09/18/2020   Chronic right shoulder pain 09/18/2020   Tubular adenoma 01/18/2020   Osteoporosis 05/12/2019   Anxiety and depression 05/12/2019   Herpes 05/12/2019   Dermatitis 05/12/2019   Colon polyps 03/17/2019   Pre-diabetes 02/19/2019   H/O total mastectomy of left breast 06/03/2018   Obesity (BMI 30.0-34.9) 03/08/2018   Insomnia 09/26/2017   Stage 3b chronic kidney disease ([place]) 08/09/2017   Hypomagnesemia 05/04/2017   Aortic atherosclerosis ([place]) 05/04/2017   Compression fracture of first lumbar vertebra ([place]) 05/04/2017   Degenerative disc disease, lumbar 05/04/2017   Anemia 02/01/2017   Atherosclerosis of native arteries of extremity with intermittent claudication ([place]) 11/09/2016   Allergic rhinitis 11/07/2016   Malignant neoplasm of upper-outer quadrant of left female breast ([place]) 10/08/2016   Postmenopausal 09/18/2016   Ache in joint 03/09/2016   Gastroesophageal reflux disease 07/01/2012    Hyperlipidemia LDL goal <100 07/01/2012    Rosalyn Gess, OTR/L,CLT 12/09/2021, 10:39 AM  [facility] [address] [place], [address] Phone: [phone]   Fax:  [phone]  Name: Arleigh Dicola MRN: [id number] Date of Birth: 1940/11/06

## 2021-12-10 ENCOUNTER — Ambulatory Visit: Payer: Medicare Other | Admitting: Occupational Therapy

## 2021-12-10 DIAGNOSIS — I972 Postmastectomy lymphedema syndrome: Secondary | ICD-10-CM

## 2021-12-10 NOTE — Therapy (Signed)
[facility] [address] [place], [place], [address] Phone: [phone]   Fax:  [phone]  Occupational Therapy Treatment  Patient Details  Name: Maria Patterson MRN: [id number] Date of Birth: 16-Sep-1940 No data recorded  Encounter Date: 12/10/2021   OT End of Session - 12/10/21 1514     Visit Number 8    Number of Visits 19    Date for OT Re-Evaluation 01/06/22    OT Start Time 1400    OT Stop Time 1432    OT Time Calculation (min) 32 min    Activity Tolerance Patient tolerated treatment well    Behavior During Therapy [facility] for tasks assessed/performed             Past Medical History:  Diagnosis Date   Anemia    Breast cancer ([place]) 2018   Left   Breast mass, left 09/22/2016   RECOMMENDATION: Ultrasound-guided core biopsies of masses at left breast 2 o'clock 2 cm from nipple, left breast 2 o'clock 5 cm from nipple, abnormal left axillary lymph nodes.   COPD (chronic obstructive pulmonary disease) (HCC)    Depression    GERD (gastroesophageal reflux disease)    History of chickenpox    Hx of adenomatous colonic polyps    Hyperglycemia    Hyperlipidemia    Obesity    Personal history of chemotherapy 2018   Left breast   Personal history of radiation therapy 2018   Postmenopausal    Shingles    right eye     Past Surgical History:  Procedure Laterality Date   ABDOMINAL HYSTERECTOMY     AXILLARY SENTINEL NODE BIOPSY Left 04/14/2017   Procedure: AXILLARY SENTINEL NODE BIOPSY;  Surgeon: Olean Ree, MD;  Location: [facility] ORS;  Service: General;  Laterality: Left;   BREAST BIOPSY Left 09/30/2016   US biopsy of 3 areas + chemo   BREAST EXCISIONAL BIOPSY Left 04/14/2017   Left breast invasive cancer left mastectomy   COLONOSCOPY WITH PROPOFOL N/A 06/17/2020   Procedure: COLONOSCOPY WITH PROPOFOL;  Surgeon: Toledo, Benay Pike, MD;  Location: [facility] ENDOSCOPY;  Service: Gastroenterology;  Laterality: N/A;    ESOPHAGOGASTRODUODENOSCOPY (EGD) WITH PROPOFOL N/A 12/24/2014   Procedure: ESOPHAGOGASTRODUODENOSCOPY (EGD) WITH PROPOFOL;  Surgeon: Manya Silvas, MD;  Location: [facility] ENDOSCOPY;  Service: Endoscopy;  Laterality: N/A;   MASTECTOMY Left 04/14/2017   Left breast invasive cancer   PORTACATH PLACEMENT Right 10/27/2016   Procedure: INSERTION PORT-A-CATH;  Surgeon: Nestor Lewandowsky, MD;  Location: [facility] ORS;  Service: General;  Laterality: Right;   TONSILECTOMY, ADENOIDECTOMY, BILATERAL MYRINGOTOMY AND TUBES     TONSILLECTOMY     TOTAL MASTECTOMY Left 04/14/2017   Procedure: TOTAL MASTECTOMY;  Surgeon: Olean Ree, MD;  Location: [facility] ORS;  Service: General;  Laterality: Left;    There were no vitals filed for this visit.   Subjective Assessment - 12/10/21 1403     Subjective  I did keep the bandages on is not easy and is hot but it is okay as long as it works.  I did bring some Benadryl ointment for you to put on my elbow but it did not really itch    Pertinent History Per chart review:  Pt is s/p total mastectomy 04/14/2017.  Completed neoadjuvant Taxotere, carbo, Herceptin and Perjeta on 02/17/2017.  Completed 1 year of Herceptin on 11/03/2017.  Would not benefit from aromatase inhibitor.Pt cellulitis started 06/03/18 in R forearm - did 3 rounds of antibiotics - appear  last one improved symptoms - DVT Doppler was negative then - she was seen since then 2 x to replace compression.  10/2021 pt reports she has not had any more episodes of cellulitis but feels her arm has continued to increase in size over time.  She reports being compliant with daytime and nighttime compression wear.    Patient Stated Goals I want my arm to get better and not have to wrap.    Currently in Pain? No/denies                 LYMPHEDEMA/ONCOLOGY QUESTIONNAIRE - 12/10/21 0001       Left Upper Extremity Lymphedema   15 cm Proximal to Olecranon Process 34 cm    10 cm Proximal to Olecranon Process 36 cm    Olecranon  Process 28 cm    15 cm Proximal to Ulnar Styloid Process 29.3 cm    10 cm Proximal to Ulnar Styloid Process 25.2 cm    Just Proximal to Ulnar Styloid Process 17.2 cm    Across Hand at [employer] 19 cm           Patient arrived with bandages in place on left upper extremity.  Measurements taken patient to decrease dose to 1 cm in the forearm compared to yesterday. Patient done skin care to left upper extremity prior to Application of Eucerin lotion on left upper extremity, apply some Benadryl ointment to volar elbow  and Isotoner glove and stockinette Did 1 Rosidal foam from hand up to upper arm  6 cm short stretch bandage from wrist through the hand and wrist 3 times up to mid forearm.   8 cm from wrist 1 time through webspace over hand up to proximal to elbow.   10 cm short stretch from mid forearm circular to upper arm  .  Patient  reviewed again precautions as well as range of motion exercises.   Patient to keep bandages on for 48 hours    I Fit for new daytime sleeve once LUE has decongest                          OT Education - 12/10/21 1513     Education Details Bandages precautions as well as home program    Person(s) Educated Patient    Methods Explanation;Demonstration    Comprehension Verbalized understanding;Returned demonstration              OT Short Term Goals - 12/09/21 0903       OT SHORT TERM GOAL #1   Title Pt will be independent with home management program for lymphedema.    Baseline pt familiar with use of compression, exercises for home program but requires some adjustments to program to ensure compliance.- did do bandage over compreession bandage -but need and agree to 3 layer bandage this date for forearm to decongest    Time 3    Period Weeks    Status On-going    Target Date 12/30/21               OT Long Term Goals - 12/09/21 1034       OT LONG TERM GOAL #1   Title Pt will be independent with wear and care of  new daytime compression sleeve and glove.    Baseline pt in need of new garment for daytime - did not decongest in forearm to 3-4cm difference in forearm    Time 4  Period Weeks    Status On-going    Target Date 01/06/22      OT LONG TERM GOAL #2   Title Pt to demonstrate a decrease in LUE measurements at all levels to decrease the risk of cellulitis and prepare the arm for fitting of new daytime sleeve.    Baseline current sleeve is approx 83 years old - pt is decongesting with  one bandage over night time and daytime compression in elbow and upper arm -but forearm still increase more than 4 cm - agree to do 3 layer bandage    Time 4    Period Weeks    Status On-going    Target Date 01/06/22                   Plan - 12/10/21 1514     Clinical Impression Statement Pt is a 82 yo old female with history of breast cancer with left mastectomy in 2018 and episodes of cellulitis.  Pt has been seen in the lymphedema clinic on several occasions over the years and was last scheduled to follow up in April 2022 to replace her day time sleeve. Pt returned on 10/30/21 with a flareup of lymphedema after not replacing her daytime compression sleeve. Patient measurements were significantly up compared to 2 years ago as well as compared to the right arm.  Patient wanted to try to use her existing daytime compression and nighttime with pump to decrease - did not want to bandaging.  She showed great decongestion in the upper arm and the hand and wrist.  But forearm continue to stay significantly increased 4.5 cm.  Discussed with patient  yesterday  that we need to try and do bandaging..  Patient returns today after being bandage for 24 hours and patient decrease circumference close to 1 cm and forearm.  Patient rebandaged today will return in 48 hours for possible fitting or getting measured for daytime compression if she decongest again..  Increase patient this week to  3 times a week until decongested and  getting measured for new daytime compression.  She would also need a new power sleeve for her nighttime compression.  Pt would benefit from skilled OT services to maximize safety and independence in daily tasks.  Pt would benefit from complete decongestive therapy for the LUE and also fitting for a new daytime sleeve.    OT Occupational Profile and History Detailed Assessment- Review of Records and additional review of physical, cognitive, psychosocial history related to current functional performance    Occupational performance deficits (Please refer to evaluation for details): ADL's;Leisure;IADL's    Body Structure / Function / Physical Skills ADL;Edema;UE functional use    Rehab Potential Good    Clinical Decision Making Limited treatment options, no task modification necessary    Comorbidities Affecting Occupational Performance: May have comorbidities impacting occupational performance    Modification or Assistance to Complete Evaluation  No modification of tasks or assist necessary to complete eval    OT Frequency 3x / week    OT Duration 4 weeks    OT Treatment/Interventions Self-care/ADL training;Manual lymph drainage;Patient/family education;Compression bandaging;Therapeutic exercise;Manual Therapy    Consulted and Agree with Plan of Care Patient             Patient will benefit from skilled therapeutic intervention in order to improve the following deficits and impairments:   Body Structure / Function / Physical Skills: ADL, Edema, UE functional use       Visit Diagnosis:  Postmastectomy lymphedema syndrome    Problem List Patient Active Problem List   Diagnosis Date Noted   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   Cervical arthritis 02/04/2021   History of breast cancer 02/04/2021   Spider vein of lower extremity 12/20/2020   Lymphedema of arm 09/18/2020   Chronic right shoulder pain 09/18/2020   Tubular adenoma 01/18/2020   Osteoporosis 05/12/2019   Anxiety and  depression 05/12/2019   Herpes 05/12/2019   Dermatitis 05/12/2019   Colon polyps 03/17/2019   Pre-diabetes 02/19/2019   H/O total mastectomy of left breast 06/03/2018   Obesity (BMI 30.0-34.9) 03/08/2018   Insomnia 09/26/2017   Stage 3b chronic kidney disease ([place]) 08/09/2017   Hypomagnesemia 05/04/2017   Aortic atherosclerosis ([place]) 05/04/2017   Compression fracture of first lumbar vertebra ([place]) 05/04/2017   Degenerative disc disease, lumbar 05/04/2017   Anemia 02/01/2017   Atherosclerosis of native arteries of extremity with intermittent claudication ([place]) 11/09/2016   Allergic rhinitis 11/07/2016   Malignant neoplasm of upper-outer quadrant of left female breast ([place]) 10/08/2016   Postmenopausal 09/18/2016   Ache in joint 03/09/2016   Gastroesophageal reflux disease 07/01/2012   Hyperlipidemia LDL goal <100 07/01/2012    Rosalyn Gess, OTR/L,CLT 12/10/2021, 3:18 PM  [facility] [address], [place], [address] Phone: [phone]   Fax:  [phone]  Name: Kemiya Batdorf MRN: [id number] Date of Birth: 1941-02-06

## 2021-12-12 ENCOUNTER — Ambulatory Visit: Payer: Medicare Other | Admitting: Occupational Therapy

## 2021-12-12 DIAGNOSIS — I972 Postmastectomy lymphedema syndrome: Secondary | ICD-10-CM

## 2021-12-12 NOTE — Therapy (Signed)
[facility] [address], [place], [address] Phone: [phone]   Fax:  [phone]  Occupational Therapy Treatment  Patient Details  Name: Maria Patterson MRN: [id number] Date of Birth: 1940/11/14 No data recorded  Encounter Date: 12/12/2021   OT End of Session - 12/12/21 0835     Visit Number 9    Number of Visits 19    Date for OT Re-Evaluation 01/06/22    OT Start Time 0834    OT Stop Time 0908    OT Time Calculation (min) 34 min    Activity Tolerance Patient tolerated treatment well    Behavior During Therapy [facility] for tasks assessed/performed             Past Medical History:  Diagnosis Date   Anemia    Breast cancer ([place]) 2018   Left   Breast mass, left 09/22/2016   RECOMMENDATION: Ultrasound-guided core biopsies of masses at left breast 2 o'clock 2 cm from nipple, left breast 2 o'clock 5 cm from nipple, abnormal left axillary lymph nodes.   COPD (chronic obstructive pulmonary disease) (HCC)    Depression    GERD (gastroesophageal reflux disease)    History of chickenpox    Hx of adenomatous colonic polyps    Hyperglycemia    Hyperlipidemia    Obesity    Personal history of chemotherapy 2018   Left breast   Personal history of radiation therapy 2018   Postmenopausal    Shingles    right eye     Past Surgical History:  Procedure Laterality Date   ABDOMINAL HYSTERECTOMY     AXILLARY SENTINEL NODE BIOPSY Left 04/14/2017   Procedure: AXILLARY SENTINEL NODE BIOPSY;  Surgeon: Olean Ree, MD;  Location: [facility] ORS;  Service: General;  Laterality: Left;   BREAST BIOPSY Left 09/30/2016   US biopsy of 3 areas + chemo   BREAST EXCISIONAL BIOPSY Left 04/14/2017   Left breast invasive cancer left mastectomy   COLONOSCOPY WITH PROPOFOL N/A 06/17/2020   Procedure: COLONOSCOPY WITH PROPOFOL;  Surgeon: Toledo, Benay Pike, MD;  Location: [facility] ENDOSCOPY;  Service: Gastroenterology;  Laterality: N/A;    ESOPHAGOGASTRODUODENOSCOPY (EGD) WITH PROPOFOL N/A 12/24/2014   Procedure: ESOPHAGOGASTRODUODENOSCOPY (EGD) WITH PROPOFOL;  Surgeon: Manya Silvas, MD;  Location: [facility] ENDOSCOPY;  Service: Endoscopy;  Laterality: N/A;   MASTECTOMY Left 04/14/2017   Left breast invasive cancer   PORTACATH PLACEMENT Right 10/27/2016   Procedure: INSERTION PORT-A-CATH;  Surgeon: Nestor Lewandowsky, MD;  Location: [facility] ORS;  Service: General;  Laterality: Right;   TONSILECTOMY, ADENOIDECTOMY, BILATERAL MYRINGOTOMY AND TUBES     TONSILLECTOMY     TOTAL MASTECTOMY Left 04/14/2017   Procedure: TOTAL MASTECTOMY;  Surgeon: Olean Ree, MD;  Location: [facility] ORS;  Service: General;  Laterality: Left;    There were no vitals filed for this visit.   Subjective Assessment - 12/12/21 0834     Subjective  DOne okay with the bandages - ready for it to came off and wash my arm    Pertinent History Per chart review:  Pt is s/p total mastectomy 04/14/2017.  Completed neoadjuvant Taxotere, carbo, Herceptin and Perjeta on 02/17/2017.  Completed 1 year of Herceptin on 11/03/2017.  Would not benefit from aromatase inhibitor.Pt cellulitis started 06/03/18 in R forearm - did 3 rounds of antibiotics - appear last one improved symptoms - DVT Doppler was negative then - she was seen since then 2 x to replace compression.  10/2021 pt reports  she has not had any more episodes of cellulitis but feels her arm has continued to increase in size over time.  She reports being compliant with daytime and nighttime compression wear.    Patient Stated Goals I want my arm to get better and not have to wrap.    Currently in Pain? No/denies                 LYMPHEDEMA/ONCOLOGY QUESTIONNAIRE - 12/12/21 0001       Right Upper Extremity Lymphedema   15 cm Proximal to Olecranon Process 33 cm    10 cm Proximal to Olecranon Process 32 cm    Olecranon Process 26 cm    15 cm Proximal to Ulnar Styloid Process 26 cm    10 cm Proximal to Ulnar Styloid  Process 22.3 cm    Just Proximal to Ulnar Styloid Process 16.5 cm      Left Upper Extremity Lymphedema   15 cm Proximal to Olecranon Process 34 cm    10 cm Proximal to Olecranon Process 35 cm    Olecranon Process 28.5 cm    15 cm Proximal to Ulnar Styloid Process 29 cm    10 cm Proximal to Ulnar Styloid Process 24.5 cm    Just Proximal to Ulnar Styloid Process 17.2 cm    Across Hand at [employer] 18.5 cm               Patient arrived with bandages in place on left upper extremity.  Measurements taken patient to decrease   again  in L forearm. Patient done skin care to left upper extremity prior to Application of Eucerin lotion on left upper extremity, apply some Benadryl ointment to volar elbow  and Isotoner glove and stockinette Did 1 Rosidal foam from hand up to upper arm  6 cm short stretch bandage from wrist through the hand and wrist 3 times up to mid forearm.   8 cm from wrist 1 time through webspace over hand up to proximal to elbow.   Switch to new 10 cm short stretch from mid forearm circular to upper arm  .  Patient  reviewed again precautions as well as range of motion exercises.   Patient to keep bandages on f for 3 days because of the long weekend.   Patient agreed to want to be bandage again today because she is decongesting left upper extremity more than in the past and trying to prevent future flareups and cellulitis. Fit for new daytime sleeve once LUE has decongest                 OT Education - 12/12/21 0835     Education Details Bandages precautions as well as home program    Person(s) Educated Patient    Methods Explanation;Demonstration    Comprehension Verbalized understanding;Returned demonstration              OT Short Term Goals - 12/09/21 0903       OT SHORT TERM GOAL #1   Title Pt will be independent with home management program for lymphedema.    Baseline pt familiar with use of compression, exercises for home program  but requires some adjustments to program to ensure compliance.- did do bandage over compreession bandage -but need and agree to 3 layer bandage this date for forearm to decongest    Time 3    Period Weeks    Status On-going    Target Date 12/30/21  OT Long Term Goals - 12/09/21 1034       OT LONG TERM GOAL #1   Title Pt will be independent with wear and care of new daytime compression sleeve and glove.    Baseline pt in need of new garment for daytime - did not decongest in forearm to 3-4cm difference in forearm    Time 4    Period Weeks    Status On-going    Target Date 01/06/22      OT LONG TERM GOAL #2   Title Pt to demonstrate a decrease in LUE measurements at all levels to decrease the risk of cellulitis and prepare the arm for fitting of new daytime sleeve.    Baseline current sleeve is approx 83 years old - pt is decongesting with  one bandage over night time and daytime compression in elbow and upper arm -but forearm still increase more than 4 cm - agree to do 3 layer bandage    Time 4    Period Weeks    Status On-going    Target Date 01/06/22                   Plan - 12/12/21 0835     Clinical Impression Statement Pt is a 81 yo old female with history of breast cancer with left mastectomy in 2018 and episodes of cellulitis.  Pt has been seen in the lymphedema clinic on several occasions over the years and was last scheduled to follow up in April 2022 to replace her day time sleeve. Pt returned on 10/30/21 with a flareup of lymphedema after not replacing her daytime compression sleeve. Patient measurements were significantly up compared to 2 years ago as well as compared to the right arm.  Patient wanted to try to use her existing daytime compression and nighttime with pump to decrease - did not want to bandaging.  She showed great decongestion in the upper arm and the hand and wrist.  But forearm continue to stay significantly increased 4.5 cm earlier  this week.  Patient agree earlier this year to go ahead and do bandaging to left upper extremity for decongestion day after being bandage for decongestion of left upper extremity.  Great progress this date and decongesting and decreasing circumference in wrist and forearm compared in the past.  Patient agree to continue bandaging until plateauing to prevent future flareups or cellulitis and left upper extremity.  Patient rebandaged today but have to keep bandages on for 3 days because of holiday weekend .   Pt  increase  this week to  3 times a week until decongested and getting measured for new daytime compression.  She would also need a new power sleeve for her nighttime compression.  Pt would benefit from skilled OT services to maximize safety and independence in daily tasks.  Pt would benefit from complete decongestive therapy for the LUE and also fitting for a new daytime sleeve.    OT Occupational Profile and History Detailed Assessment- Review of Records and additional review of physical, cognitive, psychosocial history related to current functional performance    Occupational performance deficits (Please refer to evaluation for details): ADL's;Leisure;IADL's    Body Structure / Function / Physical Skills ADL;Edema;UE functional use    Rehab Potential Good    Clinical Decision Making Limited treatment options, no task modification necessary    Comorbidities Affecting Occupational Performance: May have comorbidities impacting occupational performance    Modification or Assistance to Complete Evaluation  No  modification of tasks or assist necessary to complete eval    OT Frequency 3x / week    OT Duration 4 weeks    OT Treatment/Interventions Self-care/ADL training;Manual lymph drainage;Patient/family education;Compression bandaging;Therapeutic exercise;Manual Therapy    Consulted and Agree with Plan of Care Patient             Patient will benefit from skilled therapeutic intervention in  order to improve the following deficits and impairments:   Body Structure / Function / Physical Skills: ADL, Edema, UE functional use       Visit Diagnosis: Postmastectomy lymphedema syndrome    Problem List Patient Active Problem List   Diagnosis Date Noted   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   Cervical arthritis 02/04/2021   History of breast cancer 02/04/2021   Spider vein of lower extremity 12/20/2020   Lymphedema of arm 09/18/2020   Chronic right shoulder pain 09/18/2020   Tubular adenoma 01/18/2020   Osteoporosis 05/12/2019   Anxiety and depression 05/12/2019   Herpes 05/12/2019   Dermatitis 05/12/2019   Colon polyps 03/17/2019   Pre-diabetes 02/19/2019   H/O total mastectomy of left breast 06/03/2018   Obesity (BMI 30.0-34.9) 03/08/2018   Insomnia 09/26/2017   Stage 3b chronic kidney disease ([place]) 08/09/2017   Hypomagnesemia 05/04/2017   Aortic atherosclerosis ([place]) 05/04/2017   Compression fracture of first lumbar vertebra ([place]) 05/04/2017   Degenerative disc disease, lumbar 05/04/2017   Anemia 02/01/2017   Atherosclerosis of native arteries of extremity with intermittent claudication ([place]) 11/09/2016   Allergic rhinitis 11/07/2016   Malignant neoplasm of upper-outer quadrant of left female breast ([place]) 10/08/2016   Postmenopausal 09/18/2016   Ache in joint 03/09/2016   Gastroesophageal reflux disease 07/01/2012   Hyperlipidemia LDL goal <100 07/01/2012    Rosalyn Gess, OTR/L,CLT 12/12/2021, 9:47 AM  [facility] [address] [place], [address] Phone: [phone]   Fax:  [phone]  Name: Jettie Mannor MRN: [id number] Date of Birth: 04/16/1941

## 2021-12-16 ENCOUNTER — Ambulatory Visit: Payer: Medicare Other | Admitting: Occupational Therapy

## 2021-12-16 DIAGNOSIS — I972 Postmastectomy lymphedema syndrome: Secondary | ICD-10-CM

## 2021-12-16 NOTE — Therapy (Signed)
[facility] [address], [place], [address] Phone: [phone]   Fax:  [phone]  Occupational Therapy Treatment/10th visit  Patient Details  Name: Maria Patterson MRN: [id number] Date of Birth: 07/31/1940 No data recorded  Encounter Date: 12/16/2021   OT End of Session - 12/16/21 1159     Visit Number 10    Number of Visits 19    Date for OT Re-Evaluation 01/06/22    OT Start Time 1122    OT Stop Time 1152    OT Time Calculation (min) 30 min    Activity Tolerance Patient tolerated treatment well    Behavior During Therapy [facility] for tasks assessed/performed             Past Medical History:  Diagnosis Date   Anemia    Breast cancer ([place]) 2018   Left   Breast mass, left 09/22/2016   RECOMMENDATION: Ultrasound-guided core biopsies of masses at left breast 2 o'clock 2 cm from nipple, left breast 2 o'clock 5 cm from nipple, abnormal left axillary lymph nodes.   COPD (chronic obstructive pulmonary disease) (HCC)    Depression    GERD (gastroesophageal reflux disease)    History of chickenpox    Hx of adenomatous colonic polyps    Hyperglycemia    Hyperlipidemia    Obesity    Personal history of chemotherapy 2018   Left breast   Personal history of radiation therapy 2018   Postmenopausal    Shingles    right eye     Past Surgical History:  Procedure Laterality Date   ABDOMINAL HYSTERECTOMY     AXILLARY SENTINEL NODE BIOPSY Left 04/14/2017   Procedure: AXILLARY SENTINEL NODE BIOPSY;  Surgeon: Olean Ree, MD;  Location: [facility] ORS;  Service: General;  Laterality: Left;   BREAST BIOPSY Left 09/30/2016   US biopsy of 3 areas + chemo   BREAST EXCISIONAL BIOPSY Left 04/14/2017   Left breast invasive cancer left mastectomy   COLONOSCOPY WITH PROPOFOL N/A 06/17/2020   Procedure: COLONOSCOPY WITH PROPOFOL;  Surgeon: Toledo, Benay Pike, MD;  Location: [facility] ENDOSCOPY;  Service: Gastroenterology;   Laterality: N/A;   ESOPHAGOGASTRODUODENOSCOPY (EGD) WITH PROPOFOL N/A 12/24/2014   Procedure: ESOPHAGOGASTRODUODENOSCOPY (EGD) WITH PROPOFOL;  Surgeon: Manya Silvas, MD;  Location: [facility] ENDOSCOPY;  Service: Endoscopy;  Laterality: N/A;   MASTECTOMY Left 04/14/2017   Left breast invasive cancer   PORTACATH PLACEMENT Right 10/27/2016   Procedure: INSERTION PORT-A-CATH;  Surgeon: Nestor Lewandowsky, MD;  Location: [facility] ORS;  Service: General;  Laterality: Right;   TONSILECTOMY, ADENOIDECTOMY, BILATERAL MYRINGOTOMY AND TUBES     TONSILLECTOMY     TOTAL MASTECTOMY Left 04/14/2017   Procedure: TOTAL MASTECTOMY;  Surgeon: Olean Ree, MD;  Location: [facility] ORS;  Service: General;  Laterality: Left;    There were no vitals filed for this visit.   Subjective Assessment - 12/16/21 1158     Subjective  Done okay it was a long weekend but likely not hot.  I hope my numbers is down    Pertinent History Per chart review:  Pt is s/p total mastectomy 04/14/2017.  Completed neoadjuvant Taxotere, carbo, Herceptin and Perjeta on 02/17/2017.  Completed 1 year of Herceptin on 11/03/2017.  Would not benefit from aromatase inhibitor.Pt cellulitis started 06/03/18 in R forearm - did 3 rounds of antibiotics - appear last one improved symptoms - DVT Doppler was negative then - she was seen since then 2 x to replace compression.  10/2021 pt reports she has not had any more episodes of cellulitis but feels her arm has continued to increase in size over time.  She reports being compliant with daytime and nighttime compression wear.    Patient Stated Goals I want my arm to get better and not have to wrap.    Currently in Pain? No/denies                 LYMPHEDEMA/ONCOLOGY QUESTIONNAIRE - 12/16/21 0001       Left Upper Extremity Lymphedema   15 cm Proximal to Olecranon Process 34.5 cm    10 cm Proximal to Olecranon Process 36 cm    Olecranon Process 28 cm    15 cm Proximal to Ulnar Styloid Process 28.3 cm    10 cm  Proximal to Ulnar Styloid Process 24 cm    Just Proximal to Ulnar Styloid Process 17 cm               Patient arrived with bandages in place on left upper extremity.  Measurements taken patient to decrease   again  in L forearm. Patient done skin care to left upper extremity prior to Application of Eucerin lotion on left upper extremity, apply some Hydrocortisone ointment to volar elbow  and Isotoner glove and stockinette Did 1 Rosidal foam from hand up to upper arm  6 cm short stretch bandage from wrist through the hand and wrist 3 times up to mid forearm.   8 cm from wrist 1 time through webspace over hand up to proximal to elbow.   Switch to new 10 cm short stretch from mid forearm circular to upper arm  .  Patient  reviewed again precautions as well as range of motion exercises.   Patient to keep bandages on for 48 hours  Patient agreed to  be bandage today until in 2 days when with the plan is to send her to get measured for new daytime custom sleeve and glove as well as a new power sleeve for her nighttime garment.     Did get approval from the pink ribbon fund to assist her with her compression garments                       OT Education - 12/16/21 1159     Education Details Bandages precautions as well as home program    Person(s) Educated Patient    Methods Explanation;Demonstration    Comprehension Verbalized understanding;Returned demonstration              OT Short Term Goals - 12/09/21 0903       OT SHORT TERM GOAL #1   Title Pt will be independent with home management program for lymphedema.    Baseline pt familiar with use of compression, exercises for home program but requires some adjustments to program to ensure compliance.- did do bandage over compreession bandage -but need and agree to 3 layer bandage this date for forearm to decongest    Time 3    Period Weeks    Status On-going    Target Date 12/30/21               OT Long  Term Goals - 12/09/21 1034       OT LONG TERM GOAL #1   Title Pt will be independent with wear and care of new daytime compression sleeve and glove.    Baseline pt in need of new garment for daytime -  did not decongest in forearm to 3-4cm difference in forearm    Time 4    Period Weeks    Status On-going    Target Date 01/06/22      OT LONG TERM GOAL #2   Title Pt to demonstrate a decrease in LUE measurements at all levels to decrease the risk of cellulitis and prepare the arm for fitting of new daytime sleeve.    Baseline current sleeve is approx 83 years old - pt is decongesting with  one bandage over night time and daytime compression in elbow and upper arm -but forearm still increase more than 4 cm - agree to do 3 layer bandage    Time 4    Period Weeks    Status On-going    Target Date 01/06/22                   Plan - 12/16/21 1200     Clinical Impression Statement Pt is a 81 yo old female with history of breast cancer with left mastectomy in 2018 and episodes of cellulitis.  Pt has been seen in the lymphedema clinic on several occasions over the years and was last scheduled to follow up in April 2022 to replace her day time sleeve. Pt returned on 10/30/21 with a flareup of lymphedema after not replacing her daytime compression sleeve. Patient measurements were significantly up compared to 2 years ago as well as compared to the right arm.  Patient wanted to try to use her existing daytime compression and nighttime with pump to decrease - did not want to bandaging.  She showed great decongestion in the upper arm and the hand and wrist.  But forearm continue to stay significantly increased 4.5 cm earlier last  week.  Patient agreed last week to go ahead and do bandaging to left upper extremity for decongestion of left upper extremity.  Great progress for the last 2 session  with being bandage -decreasing circumference in wrist and forearm compared in the past.  Patient agree to  continue bandaging  for another 48 hrs  to prevent future flareups or cellulitis of left upper extremity.  Patient rebandaged today and did get approval for pink ribbon fund assistance - goal for pt to get measured in 2 days  Pt was increase to 3 times a week until decongested and getting measured for new daytime compression.  She would also need a new power sleeve for her nighttime compression.  Pt would benefit from skilled OT services to maximize safety and independence in daily tasks.  Pt would benefit from complete decongestive therapy for the LUE and also fitting for a new daytime sleeve.    OT Occupational Profile and History Detailed Assessment- Review of Records and additional review of physical, cognitive, psychosocial history related to current functional performance    Occupational performance deficits (Please refer to evaluation for details): ADL's;Leisure;IADL's    Body Structure / Function / Physical Skills ADL;Edema;UE functional use    Rehab Potential Good    Clinical Decision Making Limited treatment options, no task modification necessary    Comorbidities Affecting Occupational Performance: May have comorbidities impacting occupational performance    Modification or Assistance to Complete Evaluation  No modification of tasks or assist necessary to complete eval    OT Frequency 3x / week    OT Duration 4 weeks    OT Treatment/Interventions Self-care/ADL training;Manual lymph drainage;Patient/family education;Compression bandaging;Therapeutic exercise;Manual Therapy    Consulted and Agree with Plan of Care  Patient             Patient will benefit from skilled therapeutic intervention in order to improve the following deficits and impairments:   Body Structure / Function / Physical Skills: ADL, Edema, UE functional use       Visit Diagnosis: Postmastectomy lymphedema syndrome    Problem List Patient Active Problem List   Diagnosis Date Noted   Stenosis of left carotid  artery 02/04/2021   Cervical arthritis 02/04/2021   History of breast cancer 02/04/2021   Spider vein of lower extremity 12/20/2020   Lymphedema of arm 09/18/2020   Chronic right shoulder pain 09/18/2020   Tubular adenoma 01/18/2020   Osteoporosis 05/12/2019   Anxiety and depression 05/12/2019   Herpes 05/12/2019   Dermatitis 05/12/2019   Colon polyps 03/17/2019   Pre-diabetes 02/19/2019   H/O total mastectomy of left breast 06/03/2018   Obesity (BMI 30.0-34.9) 03/08/2018   Insomnia 09/26/2017   Stage 3b chronic kidney disease ([place]) 08/09/2017   Hypomagnesemia 05/04/2017   Aortic atherosclerosis ([place]) 05/04/2017   Compression fracture of first lumbar vertebra ([place]) 05/04/2017   Degenerative disc disease, lumbar 05/04/2017   Anemia 02/01/2017   Atherosclerosis of native arteries of extremity with intermittent claudication ([place]) 11/09/2016   Allergic rhinitis 11/07/2016   Malignant neoplasm of upper-outer quadrant of left female breast ([place]) 10/08/2016   Postmenopausal 09/18/2016   Ache in joint 03/09/2016   Gastroesophageal reflux disease 07/01/2012   Hyperlipidemia LDL goal <100 07/01/2012    Rosalyn Gess, OTR/L,CLT 12/16/2021, 12:06 PM  [facility] [address], [place], [address] Phone: [phone]   Fax:  [phone]  Name: Kasumi Ditullio MRN: [id number] Date of Birth: [birth year]

## 2021-12-18 ENCOUNTER — Ambulatory Visit: Payer: Medicare Other | Attending: Oncology | Admitting: Occupational Therapy

## 2021-12-18 ENCOUNTER — Ambulatory Visit: Payer: Medicare Other | Admitting: Occupational Therapy

## 2021-12-18 DIAGNOSIS — I972 Postmastectomy lymphedema syndrome: Secondary | ICD-10-CM | POA: Insufficient documentation

## 2021-12-18 NOTE — Therapy (Signed)
[facility] [address], [place], [address] Phone: [phone]   Fax:  [phone]  Occupational Therapy Treatment  Patient Details  Name: Maria Patterson MRN: [id number] Date of Birth: Sep 11, 1940 No data recorded  Encounter Date: 12/18/2021   OT End of Session - 12/18/21 0941     Visit Number 11    Number of Visits 19    Date for OT Re-Evaluation 01/06/22    OT Start Time 0902    OT Stop Time 0935    OT Time Calculation (min) 33 min    Activity Tolerance Patient tolerated treatment well    Behavior During Therapy [facility] for tasks assessed/performed             Past Medical History:  Diagnosis Date   Anemia    Breast cancer ([place]) 2018   Left   Breast mass, left 09/22/2016   RECOMMENDATION: Ultrasound-guided core biopsies of masses at left breast 2 o'clock 2 cm from nipple, left breast 2 o'clock 5 cm from nipple, abnormal left axillary lymph nodes.   COPD (chronic obstructive pulmonary disease) (HCC)    Depression    GERD (gastroesophageal reflux disease)    History of chickenpox    Hx of adenomatous colonic polyps    Hyperglycemia    Hyperlipidemia    Obesity    Personal history of chemotherapy 2018   Left breast   Personal history of radiation therapy 2018   Postmenopausal    Shingles    right eye     Past Surgical History:  Procedure Laterality Date   ABDOMINAL HYSTERECTOMY     AXILLARY SENTINEL NODE BIOPSY Left 04/14/2017   Procedure: AXILLARY SENTINEL NODE BIOPSY;  Surgeon: Olean Ree, MD;  Location: [facility] ORS;  Service: General;  Laterality: Left;   BREAST BIOPSY Left 09/30/2016   US biopsy of 3 areas + chemo   BREAST EXCISIONAL BIOPSY Left 04/14/2017   Left breast invasive cancer left mastectomy   COLONOSCOPY WITH PROPOFOL N/A 06/17/2020   Procedure: COLONOSCOPY WITH PROPOFOL;  Surgeon: Toledo, Benay Pike, MD;  Location: [facility] ENDOSCOPY;  Service: Gastroenterology;  Laterality: N/A;    ESOPHAGOGASTRODUODENOSCOPY (EGD) WITH PROPOFOL N/A 12/24/2014   Procedure: ESOPHAGOGASTRODUODENOSCOPY (EGD) WITH PROPOFOL;  Surgeon: Manya Silvas, MD;  Location: [facility] ENDOSCOPY;  Service: Endoscopy;  Laterality: N/A;   MASTECTOMY Left 04/14/2017   Left breast invasive cancer   PORTACATH PLACEMENT Right 10/27/2016   Procedure: INSERTION PORT-A-CATH;  Surgeon: Nestor Lewandowsky, MD;  Location: [facility] ORS;  Service: General;  Laterality: Right;   TONSILECTOMY, ADENOIDECTOMY, BILATERAL MYRINGOTOMY AND TUBES     TONSILLECTOMY     TOTAL MASTECTOMY Left 04/14/2017   Procedure: TOTAL MASTECTOMY;  Surgeon: Olean Ree, MD;  Location: [facility] ORS;  Service: General;  Laterality: Left;    There were no vitals filed for this visit.   Subjective Assessment - 12/18/21 0906     Subjective  I am ready to get out of  all of this stuff on m yarm - like compression and  bandaging. But I know I needed and it helps , and working    Pertinent History Per chart review:  Pt is s/p total mastectomy 04/14/2017.  Completed neoadjuvant Taxotere, carbo, Herceptin and Perjeta on 02/17/2017.  Completed 1 year of Herceptin on 11/03/2017.  Would not benefit from aromatase inhibitor.Pt cellulitis started 06/03/18 in R forearm - did 3 rounds of antibiotics - appear last one improved symptoms - DVT Doppler was negative  then - she was seen since then 2 x to replace compression.  10/2021 pt reports she has not had any more episodes of cellulitis but feels her arm has continued to increase in size over time.  She reports being compliant with daytime and nighttime compression wear.    Patient Stated Goals I want my arm to get better and not have to wrap.    Currently in Pain? No/denies                 LYMPHEDEMA/ONCOLOGY QUESTIONNAIRE - 12/18/21 0001       Left Upper Extremity Lymphedema   15 cm Proximal to Olecranon Process 33.2 cm    10 cm Proximal to Olecranon Process 35.3 cm    Olecranon Process 28 cm    15 cm Proximal to  Ulnar Styloid Process 28 cm    10 cm Proximal to Ulnar Styloid Process 23.2 cm    Just Proximal to Ulnar Styloid Process 17.2 cm             Patient arrived with bandages in place on left upper extremity.  Measurements taken patient decreased   again  in L forearm and wrist  Patient done skin care to left upper extremity prior to Application of Eucerin lotion on left upper extremity, apply some Hydrocortisone ointment to volar elbow  and Isotoner glove and stockinette Did 1 Rosidal foam from hand up to upper arm  6 cm short stretch bandage from wrist through the hand and wrist 3 times up to mid forearm.   8 cm from wrist 1 time through webspace over hand up to proximal to elbow.   Switch to new 10 cm short stretch from mid forearm circular to upper arm  .  Patient  reviewed again precautions as well as range of motion exercises.   Patient to keep bandages on for weekend   Patient agreed to  be bandage today until Monday because she is decongesting greatly - plan is to send her to get measured for new daytime custom sleeve and glove as well as a new power sleeve for her nighttime garment at [facility]     Did get approval from the pink ribbon fund to assist her with her compression garments                     OT Education - 12/18/21 0941     Education Details Bandages precautions as well as home program    Person(s) Educated Patient    Methods Explanation;Demonstration    Comprehension Verbalized understanding;Returned demonstration              OT Short Term Goals - 12/09/21 0903       OT SHORT TERM GOAL #1   Title Pt will be independent with home management program for lymphedema.    Baseline pt familiar with use of compression, exercises for home program but requires some adjustments to program to ensure compliance.- did do bandage over compreession bandage -but need and agree to 3 layer bandage this date for forearm to decongest    Time 3    Period Weeks     Status On-going    Target Date 12/30/21               OT Long Term Goals - 12/09/21 1034       OT LONG TERM GOAL #1   Title Pt will be independent with wear and care of new daytime compression sleeve and  glove.    Baseline pt in need of new garment for daytime - did not decongest in forearm to 3-4cm difference in forearm    Time 4    Period Weeks    Status On-going    Target Date 01/06/22      OT LONG TERM GOAL #2   Title Pt to demonstrate a decrease in LUE measurements at all levels to decrease the risk of cellulitis and prepare the arm for fitting of new daytime sleeve.    Baseline current sleeve is approx 83 years old - pt is decongesting with  one bandage over night time and daytime compression in elbow and upper arm -but forearm still increase more than 4 cm - agree to do 3 layer bandage    Time 4    Period Weeks    Status On-going    Target Date 01/06/22                   Plan - 12/18/21 0942     Clinical Impression Statement Pt is a 81 yo old female with history of breast cancer with left mastectomy in 2018 and episodes of cellulitis.  Pt has been seen in the lymphedema clinic on several occasions over the years and was last scheduled to follow up in April 2022 to replace her day time sleeve. Pt returned on 10/30/21 with a flareup of lymphedema after not replacing her daytime compression sleeve. Patient measurements were significantly up compared to 2 years ago as well as compared to the right arm.  Patient wanted to try to use her existing daytime compression and nighttime with pump to decrease - did not want to bandaging.  She showed great decongestion in the upper arm and the hand and wrist.  But forearm continue to stay significantly increased 4.5 cm earlier last  week.  Patient agreed last week to go ahead and do bandaging to left upper extremity for decongestion of left upper extremity.  Great progress for the last 3 session  with being bandage -decreasing  circumference in wrist and forearm compared in the past.  Patient agree to continue bandaging until Monday  to prevent future flareups or cellulitis of left upper extremity.  Patient rebandaged today and did get approval for pink ribbon fund assistance for compression garments - goal for pt to get measured Monday.  She would also need a new power sleeve for her nighttime compression too.  Pt would benefit from skilled OT services to maximize safety and independence in daily tasks.  Pt would benefit from complete decongestive therapy for the LUE and also fitting for a new daytime sleeve.    OT Occupational Profile and History Detailed Assessment- Review of Records and additional review of physical, cognitive, psychosocial history related to current functional performance    Occupational performance deficits (Please refer to evaluation for details): ADL's;Leisure;IADL's    Body Structure / Function / Physical Skills ADL;Edema;UE functional use    Rehab Potential Good    Clinical Decision Making Limited treatment options, no task modification necessary    Comorbidities Affecting Occupational Performance: May have comorbidities impacting occupational performance    Modification or Assistance to Complete Evaluation  No modification of tasks or assist necessary to complete eval    OT Frequency 3x / week    OT Duration 4 weeks    OT Treatment/Interventions Self-care/ADL training;Manual lymph drainage;Patient/family education;Compression bandaging;Therapeutic exercise;Manual Therapy    Consulted and Agree with Plan of Care Patient  Patient will benefit from skilled therapeutic intervention in order to improve the following deficits and impairments:   Body Structure / Function / Physical Skills: ADL, Edema, UE functional use       Visit Diagnosis: Postmastectomy lymphedema syndrome    Problem List Patient Active Problem List   Diagnosis Date Noted   Stenosis of left carotid artery  02/04/2021   Cervical arthritis 02/04/2021   History of breast cancer 02/04/2021   Spider vein of lower extremity 12/20/2020   Lymphedema of arm 09/18/2020   Chronic right shoulder pain 09/18/2020   Tubular adenoma 01/18/2020   Osteoporosis 05/12/2019   Anxiety and depression 05/12/2019   Herpes 05/12/2019   Dermatitis 05/12/2019   Colon polyps 03/17/2019   Pre-diabetes 02/19/2019   H/O total mastectomy of left breast 06/03/2018   Obesity (BMI 30.0-34.9) 03/08/2018   Insomnia 09/26/2017   Stage 3b chronic kidney disease ([place]) 08/09/2017   Hypomagnesemia 05/04/2017   Aortic atherosclerosis ([place]) 05/04/2017   Compression fracture of first lumbar vertebra ([place]) 05/04/2017   Degenerative disc disease, lumbar 05/04/2017   Anemia 02/01/2017   Atherosclerosis of native arteries of extremity with intermittent claudication ([place]) 11/09/2016   Allergic rhinitis 11/07/2016   Malignant neoplasm of upper-outer quadrant of left female breast ([place]) 10/08/2016   Postmenopausal 09/18/2016   Ache in joint 03/09/2016   Gastroesophageal reflux disease 07/01/2012   Hyperlipidemia LDL goal <100 07/01/2012    Rosalyn Gess, OTR/L,CLT 12/18/2021, 9:44 AM  [facility] [address] [place], [address] Phone: [phone]   Fax:  [phone]  Name: Maria Patterson MRN: [id number] Date of Birth: 07-03-1941

## 2021-12-22 ENCOUNTER — Ambulatory Visit: Payer: Medicare Other | Admitting: Occupational Therapy

## 2021-12-22 DIAGNOSIS — I972 Postmastectomy lymphedema syndrome: Secondary | ICD-10-CM | POA: Diagnosis not present

## 2021-12-22 NOTE — Therapy (Signed)
[place] [facility] [address], [place], [address] Phone: [phone]   Fax:  [phone]  Occupational Therapy Treatment  Patient Details  Name: Maria Patterson MRN: [id number] Date of Birth: [birth year] No data recorded  Encounter Date: 12/22/2021   OT End of Session - 12/22/21 1255     Visit Number 12    Number of Visits 19    Date for OT Re-Evaluation 01/06/22    OT Start Time 0945    OT Stop Time 1020    OT Time Calculation (min) 35 min    Activity Tolerance Patient tolerated treatment well    Behavior During Therapy [facility] for tasks assessed/performed             Past Medical History:  Diagnosis Date   Anemia    Breast cancer ([place]) 2018   Left   Breast mass, left 09/22/2016   RECOMMENDATION: Ultrasound-guided core biopsies of masses at left breast 2 o'clock 2 cm from nipple, left breast 2 o'clock 5 cm from nipple, abnormal left axillary lymph nodes.   COPD (chronic obstructive pulmonary disease) (HCC)    Depression    GERD (gastroesophageal reflux disease)    History of chickenpox    Hx of adenomatous colonic polyps    Hyperglycemia    Hyperlipidemia    Obesity    Personal history of chemotherapy 2018   Left breast   Personal history of radiation therapy 2018   Postmenopausal    Shingles    right eye     Past Surgical History:  Procedure Laterality Date   ABDOMINAL HYSTERECTOMY     AXILLARY SENTINEL NODE BIOPSY Left 04/14/2017   Procedure: AXILLARY SENTINEL NODE BIOPSY;  Surgeon: Olean Ree, MD;  Location: [facility] ORS;  Service: General;  Laterality: Left;   BREAST BIOPSY Left 09/30/2016   US biopsy of 3 areas + chemo   BREAST EXCISIONAL BIOPSY Left 04/14/2017   Left breast invasive cancer left mastectomy   COLONOSCOPY WITH PROPOFOL N/A 06/17/2020   Procedure: COLONOSCOPY WITH PROPOFOL;  Surgeon: Toledo, Benay Pike, MD;  Location: [facility] ENDOSCOPY;  Service: Gastroenterology;  Laterality: N/A;    ESOPHAGOGASTRODUODENOSCOPY (EGD) WITH PROPOFOL N/A 12/24/2014   Procedure: ESOPHAGOGASTRODUODENOSCOPY (EGD) WITH PROPOFOL;  Surgeon: Manya Silvas, MD;  Location: [facility] ENDOSCOPY;  Service: Endoscopy;  Laterality: N/A;   MASTECTOMY Left 04/14/2017   Left breast invasive cancer   PORTACATH PLACEMENT Right 10/27/2016   Procedure: INSERTION PORT-A-CATH;  Surgeon: Nestor Lewandowsky, MD;  Location: [facility] ORS;  Service: General;  Laterality: Right;   TONSILECTOMY, ADENOIDECTOMY, BILATERAL MYRINGOTOMY AND TUBES     TONSILLECTOMY     TOTAL MASTECTOMY Left 04/14/2017   Procedure: TOTAL MASTECTOMY;  Surgeon: Olean Ree, MD;  Location: [facility] ORS;  Service: General;  Laterality: Left;    There were no vitals filed for this visit.   Subjective Assessment - 12/22/21 1254     Subjective  I kept it on but is being long it did open up at the back of my elbow again likely is not that hard.    Pertinent History Per chart review:  Pt is s/p total mastectomy 04/14/2017.  Completed neoadjuvant Taxotere, carbo, Herceptin and Perjeta on 02/17/2017.  Completed 1 year of Herceptin on 11/03/2017.  Would not benefit from aromatase inhibitor.Pt cellulitis started 06/03/18 in R forearm - did 3 rounds of antibiotics - appear last one improved symptoms - DVT Doppler was negative then - she was seen since then 2  x to replace compression.  10/2021 pt reports she has not had any more episodes of cellulitis but feels her arm has continued to increase in size over time.  She reports being compliant with daytime and nighttime compression wear.    Patient Stated Goals I want my arm to get better and not have to wrap.    Currently in Pain? No/denies                 LYMPHEDEMA/ONCOLOGY QUESTIONNAIRE - 12/22/21 0001       Left Upper Extremity Lymphedema   15 cm Proximal to Olecranon Process 34.5 cm    10 cm Proximal to Olecranon Process 35.3 cm    Olecranon Process 28 cm    15 cm Proximal to Ulnar Styloid Process 28 cm    10 cm  Proximal to Ulnar Styloid Process 23.8 cm    Just Proximal to Ulnar Styloid Process 16.8 cm             Patient arrived with bandages in place on left upper extremity.  She kept it on over the weekend and some of it came loose at the posterior elbow.   Measurements taken -measurements about the same then last time. Patient done skin care to left upper extremity prior to Application of Eucerin lotion on left upper extremity, apply some Hydrocortisone ointment to volar elbow  and Isotoner glove and stockinette Did 1 Rosidal foam from hand up to upper arm  6 cm short stretch bandage from wrist through the hand and wrist 3 times up to mid forearm.   8 cm from wrist 1 time through webspace over hand up to proximal to elbow.   Switch to new 10 cm short stretch from mid forearm circular to upper arm  .  Patient  reviewed again precautions as well as range of motion exercises.   Patient to keep bandages on for weekend   Patient was bandaged today we will keep it on for 2 to 3 days and has appointment on Thursday at [facility] to get measured for new daytime custom compression sleeve and glove as well as new power sleeve for her nighttime garment. Patient to after her measurements on Thursday to continue with nighttime and daytime compression with 1 bandage over right and palm using twice a day and we will follow-up with patient next week.   Did get approval from the pink ribbon fund to assist her with her compression garments                   OT Education - 12/22/21 1255     Education Details Bandages precautions as well as home program    Person(s) Educated Patient    Methods Explanation;Demonstration    Comprehension Verbalized understanding;Returned demonstration              OT Short Term Goals - 12/09/21 0903       OT SHORT TERM GOAL #1   Title Pt will be independent with home management program for lymphedema.    Baseline pt familiar with use of  compression, exercises for home program but requires some adjustments to program to ensure compliance.- did do bandage over compreession bandage -but need and agree to 3 layer bandage this date for forearm to decongest    Time 3    Period Weeks    Status On-going    Target Date 12/30/21  OT Long Term Goals - 12/09/21 1034       OT LONG TERM GOAL #1   Title Pt will be independent with wear and care of new daytime compression sleeve and glove.    Baseline pt in need of new garment for daytime - did not decongest in forearm to 3-4cm difference in forearm    Time 4    Period Weeks    Status On-going    Target Date 01/06/22      OT LONG TERM GOAL #2   Title Pt to demonstrate a decrease in LUE measurements at all levels to decrease the risk of cellulitis and prepare the arm for fitting of new daytime sleeve.    Baseline current sleeve is approx 83 years old - pt is decongesting with  one bandage over night time and daytime compression in elbow and upper arm -but forearm still increase more than 4 cm - agree to do 3 layer bandage    Time 4    Period Weeks    Status On-going    Target Date 01/06/22                   Plan - 12/22/21 1255     Clinical Impression Statement Pt is a 81 yo old female with history of breast cancer with left mastectomy in 2018 and episodes of cellulitis.  Pt has been seen in the lymphedema clinic on several occasions over the years and was last scheduled to follow up in April 2022 to replace her day time sleeve. Pt returned on 10/30/21 with a flareup of lymphedema after not replacing her daytime compression sleeve. Patient measurements were significantly up compared to 2 years ago as well as compared to the right arm.  Patient wanted to try to use her existing daytime compression and nighttime with pump to decrease - did not want to bandaging.  She showed great decongestion in the upper arm and the hand and wrist.  But forearm continue to stay  significantly increased 4.5 cm earlier last  week.  Patient agreed 2 wks ago to do bandaging to left upper extremity for decongestion of left upper extremity.  Great progress for the last 3 session  with being bandage -decreasing circumference in wrist and forearm compared in the past.  Patient did not decongest this past weekend we will send patient on Thursday to get measured for new compression garments for daytime.  Patient was bandaged today to keep on for the next 2 to 3 days.  Patient rebandaged today and did get approval for pink ribbon fund assistance for compression garments..  She would also need a new power sleeve for her nighttime compression too.  Pt would benefit from skilled OT services to maximize safety and independence in daily tasks.  Pt would benefit from complete decongestive therapy for the LUE and also fitting for a new daytime sleeve.    OT Occupational Profile and History Detailed Assessment- Review of Records and additional review of physical, cognitive, psychosocial history related to current functional performance    Occupational performance deficits (Please refer to evaluation for details): ADL's;Leisure;IADL's    Body Structure / Function / Physical Skills ADL;Edema;UE functional use    Rehab Potential Good    Clinical Decision Making Limited treatment options, no task modification necessary    Comorbidities Affecting Occupational Performance: May have comorbidities impacting occupational performance    Modification or Assistance to Complete Evaluation  No modification of tasks or assist necessary to complete  eval    OT Frequency 3x / week    OT Duration 4 weeks    OT Treatment/Interventions Self-care/ADL training;Manual lymph drainage;Patient/family education;Compression bandaging;Therapeutic exercise;Manual Therapy    Consulted and Agree with Plan of Care Patient             Patient will benefit from skilled therapeutic intervention in order to improve the  following deficits and impairments:   Body Structure / Function / Physical Skills: ADL, Edema, UE functional use       Visit Diagnosis: Postmastectomy lymphedema syndrome    Problem List Patient Active Problem List   Diagnosis Date Noted   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   Cervical arthritis 02/04/2021   History of breast cancer 02/04/2021   Spider vein of lower extremity 12/20/2020   Lymphedema of arm 09/18/2020   Chronic right shoulder pain 09/18/2020   Tubular adenoma 01/18/2020   Osteoporosis 05/12/2019   Anxiety and depression 05/12/2019   Herpes 05/12/2019   Dermatitis 05/12/2019   Colon polyps 03/17/2019   Pre-diabetes 02/19/2019   H/O total mastectomy of left breast 06/03/2018   Obesity (BMI 30.0-34.9) 03/08/2018   Insomnia 09/26/2017   Stage 3b chronic kidney disease ([place]) 08/09/2017   Hypomagnesemia 05/04/2017   Aortic atherosclerosis ([place]) 05/04/2017   Compression fracture of first lumbar vertebra ([place]) 05/04/2017   Degenerative disc disease, lumbar 05/04/2017   Anemia 02/01/2017   Atherosclerosis of native arteries of extremity with intermittent claudication ([place]) 11/09/2016   Allergic rhinitis 11/07/2016   Malignant neoplasm of upper-outer quadrant of left female breast ([place]) 10/08/2016   Postmenopausal 09/18/2016   Ache in joint 03/09/2016   Gastroesophageal reflux disease 07/01/2012   Hyperlipidemia LDL goal <100 07/01/2012    Rosalyn Gess, OTR/L,CLT 12/22/2021, 12:59 PM  [place] [facility] [address], [place], [address] Phone: [phone]   Fax:  [phone]  Name: Medrith Veillon MRN: [id number] Date of Birth: 25-Sep-1940

## 2021-12-23 ENCOUNTER — Encounter: Payer: Self-pay | Admitting: Internal Medicine

## 2021-12-23 ENCOUNTER — Ambulatory Visit (INDEPENDENT_AMBULATORY_CARE_PROVIDER_SITE_OTHER): Payer: Medicare Other | Admitting: Internal Medicine

## 2021-12-23 ENCOUNTER — Telehealth: Payer: Self-pay | Admitting: Internal Medicine

## 2021-12-23 VITALS — BP 130/70 | HR 68 | Temp 98.2°F | Resp 14 | Ht 62.0 in | Wt 176.4 lb

## 2021-12-23 DIAGNOSIS — R739 Hyperglycemia, unspecified: Secondary | ICD-10-CM | POA: Diagnosis not present

## 2021-12-23 DIAGNOSIS — L603 Nail dystrophy: Secondary | ICD-10-CM

## 2021-12-23 DIAGNOSIS — E785 Hyperlipidemia, unspecified: Secondary | ICD-10-CM

## 2021-12-23 DIAGNOSIS — R1310 Dysphagia, unspecified: Secondary | ICD-10-CM

## 2021-12-23 DIAGNOSIS — E611 Iron deficiency: Secondary | ICD-10-CM

## 2021-12-23 DIAGNOSIS — J029 Acute pharyngitis, unspecified: Secondary | ICD-10-CM

## 2021-12-23 DIAGNOSIS — I10 Essential (primary) hypertension: Secondary | ICD-10-CM | POA: Diagnosis not present

## 2021-12-23 DIAGNOSIS — E559 Vitamin D deficiency, unspecified: Secondary | ICD-10-CM

## 2021-12-23 DIAGNOSIS — I2510 Atherosclerotic heart disease of native coronary artery without angina pectoris: Secondary | ICD-10-CM

## 2021-12-23 LAB — HEMOGLOBIN A1C: Hgb A1c MFr Bld: 5.9 % (ref 4.6–6.5)

## 2021-12-23 LAB — LIPID PANEL
Cholesterol: 190 mg/dL (ref 0–200)
HDL: 69.5 mg/dL (ref >39.00)
LDL Cholesterol: 107 mg/dL — ABNORMAL HIGH (ref 0–99)
NonHDL: 120.7
Total CHOL/HDL Ratio: 3
Triglycerides: 71 mg/dL (ref 0.0–149.0)
VLDL: 14.2 mg/dL (ref 0.0–40.0)

## 2021-12-23 LAB — HEPATIC FUNCTION PANEL
ALT: 22 U/L (ref 0–35)
AST: 22 U/L (ref 0–37)
Albumin: 4 g/dL (ref 3.5–5.2)
Alkaline Phosphatase: 87 U/L (ref 39–117)
Bilirubin, Direct: 0.1 mg/dL (ref 0.0–0.3)
Total Bilirubin: 0.3 mg/dL (ref 0.2–1.2)
Total Protein: 6.6 g/dL (ref 6.0–8.3)

## 2021-12-23 LAB — IBC + FERRITIN
Ferritin: 139.4 ng/mL (ref 10.0–291.0)
Iron: 80 ug/dL (ref 42–145)
Saturation Ratios: 29.6 % (ref 20.0–50.0)
TIBC: 270.2 ug/dL (ref 250.0–450.0)
Transferrin: 193 mg/dL — ABNORMAL LOW (ref 212.0–360.0)

## 2021-12-23 LAB — VITAMIN D 25 HYDROXY (VIT D DEFICIENCY, FRACTURES): VITD: 58.99 ng/mL (ref 30.00–100.00)

## 2021-12-23 MED ORDER — NEBIVOLOL HCL 2.5 MG PO TABS: 2.5000 mg | ORAL_TABLET | Freq: Every day | ORAL | 3 refills | Status: DC

## 2021-12-23 MED ORDER — ROSUVASTATIN CALCIUM 20 MG PO TABS: 20.0000 mg | ORAL_TABLET | Freq: Every day | ORAL | 3 refills | Status: DC

## 2021-12-23 MED ORDER — EZETIMIBE 10 MG PO TABS: 10.0000 mg | ORAL_TABLET | Freq: Every day | ORAL | 3 refills | Status: DC

## 2021-12-23 NOTE — Telephone Encounter (Signed)
Lft pt vm to call ofc . thanks 

## 2021-12-23 NOTE — Patient Instructions (Addendum)
Please call and schedule cardiology appt Dr. Kate Sable Phone Fax E-mail Address  [phone] Not available [place] [address]     Specialties     Cardiology, Radiology         [facility] outpatient imaging -will do CT neck  [address] [place], [place], [place] [address] Phone: [phone]  Nasal saline 2 sprays in nose and xyzal at night  Mucinex dm or robitussin dm (guafenisen and dextramethophan)    [phone] You can also contact our MyChart Patient Support Line at [phone] or [phone] to request assistance.   Allergies, Adult An allergy is a condition in which the body's defense system (immune system) comes in contact with an allergen and reacts to it. An allergen is anything that causes an allergic reaction. Allergens cause the immune system to make proteins for fighting infections (antibodies). These antibodies cause cells to release chemicals called histamines that set off the symptoms of an allergic reaction. Allergies often affect the nasal passages (allergic rhinitis), eyes (allergic conjunctivitis), skin (atopic dermatitis), and stomach. Allergies can be mild, moderate, or severe. They cannot spread from person to person. Allergies can develop at any age and may be outgrown. What are the causes? This condition is caused by allergens. Common allergens include: Outdoor allergens, such as pollen, car fumes, and mold. Indoor allergens, such as dust, smoke, mold, and pet dander. Other allergens, such as foods, medicines, scents, insect bites or stings, and other skin irritants. What increases the risk? You are more likely to develop this condition if you have: Family members with allergies. Family members who have any condition that may be caused by allergens, such as asthma. This may make you more likely to have other allergies. What are the signs or symptoms? Symptoms of this condition depend on the severity of  the allergy. Mild to moderate symptoms Runny nose, stuffy nose (nasal congestion), or sneezing. Itchy mouth, ears, or throat. A feeling of mucus dripping down the back of your throat (postnasal drip). Sore throat. Itchy, red, watery, or puffy eyes. Skin rash, or itchy, red, swollen areas of skin (hives). Stomach cramps or bloating. Severe symptoms Severe allergies to food, medicine, or insect bites may cause anaphylaxis, which can be life-threatening. Symptoms include: A red (flushed) face. Wheezing or coughing. Swollen lips, tongue, or mouth. Tight or swollen throat. Chest pain or tightness, or rapid heartbeat. Trouble breathing or shortness of breath. Pain in the abdomen, vomiting, or diarrhea. Dizziness or fainting. How is this diagnosed? This condition is diagnosed based on your symptoms, your family and medical history, and a physical exam. You may also have tests, including: Skin tests to see how your skin reacts to allergens that may be causing your symptoms. Tests include: Skin prick test. For this test, an allergen is introduced to your body through a small opening in the skin. Intradermal skin test. For this test, a small amount of allergen is injected under the first layer of your skin. Patch test. For this test, a small amount of allergen is placed on your skin. The area is covered and then checked after a few days. Blood tests. A challenge test. For this test, you will eat or breathe in a small amount of allergen to see if you have an allergic reaction. You may also be asked to: Keep a food diary. This is a record of all the foods, drinks, and symptoms you have in a day. Try an elimination diet. To do  this: Remove certain foods from your diet. Add those foods back one by one to find out if any foods cause an allergic reaction. How is this treated?     Treatment for allergies depends on your symptoms. Treatment may include: Cold, wet cloths (cold compresses) to soothe  itching and swelling. Eye drops or nasal sprays. Nasal irrigation to help clear your mucus or keep the nasal passages moist. A humidifier to add moisture to the air. Skin creams to treat rashes or itching. Oral antihistamines or other medicines to block the reaction or to treat inflammation. Diet changes to remove foods that cause allergies. Being exposed again and again to tiny amounts of allergens to help you build a defense against it (tolerance). This is called immunotherapy. Examples include: Allergy shot. You receive an injection that contains an allergen. Sublingual immunotherapy. You take a small dose of allergen under your tongue. Emergency injection for anaphylaxis. You give yourself a shot using a syringe (auto-injector) that contains the amount of medicine you need. Your health care provider will teach you how to give yourself an injection. Follow these instructions at home: Medicines  Take or apply over-the-counter and prescription medicines only as told by your health care provider. Always carry your auto-injector pen if you are at risk of anaphylaxis. Give yourself an injection as told by your health care provider. Eating and drinking Follow instructions from your health care provider about eating or drinking restrictions. Drink enough fluid to keep your urine pale yellow. General instructions Wear a medical alert bracelet or necklace to let others know that you have had anaphylaxis before. Avoid known allergens whenever possible. Keep all follow-up visits as told by your health care provider. This is important. Contact a health care provider if: Your symptoms do not get better with treatment. Get help right away if: You have symptoms of anaphylaxis. These include: Swollen mouth, tongue, or throat. Pain or tightness in your chest. Trouble breathing or shortness of breath. Dizziness or fainting. Severe abdominal pain, vomiting, or diarrhea. These symptoms may represent a  serious problem that is an emergency. Do not wait to see if the symptoms will go away. Get medical help right away. Call your local emergency services (911 in the [place] Do not drive yourself to the hospital. Summary Take or apply over-the-counter and prescription medicines only as told by your health care provider. Avoid known allergens when possible. Always carry your auto-injector pen if you are at risk of anaphylaxis. Give yourself an injection as told by your health care provider. Wear a medical alert bracelet or necklace to let others know that you have had anaphylaxis before. Anaphylaxis is a life-threatening emergency. Get help right away. This information is not intended to replace advice given to you by your health care provider. Make sure you discuss any questions you have with your health care provider. Document Revised: 03/04/2020 Document Reviewed: 05/17/2019 Elsevier Patient Education  [place] Drip Postnasal drip is the feeling of mucus going down the back of your throat. Mucus is a slimy substance that moistens and cleans your nose and throat, as well as the air pockets in face bones near your forehead and cheeks (sinuses). Small amounts of mucus pass from your nose and sinuses down the back of your throat all the time. This is normal. When you produce too much mucus or the mucus gets too thick, you can feel it. Some common causes of postnasal drip include: Having more mucus because of: A cold  or the flu. Allergies. Cold air. Certain medicines. Gastroesophageal reflux. Having more mucus that is thicker because of: A sinus or nasal infection. Dry air. A food allergy. Follow these instructions at home: Relieving discomfort  Gargle with a mixture of salt and water 3-4 times a day or as needed. To make salt water, completely dissolve -1 tsp (3-6 g) of salt in 1 cup (237 mL) of warm water. If the air in your home is dry, use a humidifier to add moisture  to the air. Use a saline spray or a container (neti pot) to flush out the nose (nasal irrigation). These methods can help clear away mucus and keep the nasal passages moist. General instructions Take over-the-counter and prescription medicines only as told by your health care provider. Follow instructions from your health care provider about eating or drinking restrictions. You may need to avoid caffeine. Avoid things that you know you are allergic to (allergens), like dust, mold, pollen, pets, or certain foods. Drink enough fluid to keep your urine pale yellow. Keep all follow-up visits. This is important. Contact a health care provider if: You have a fever. You have a sore throat or difficulty swallowing. You have a headache. You have sinus or ear pain. You have a cough that does not go away. The mucus from your nose becomes thick and is green or yellow in color. You have cold or flu symptoms that last more than 10 days. Summary Postnasal drip is the feeling of mucus going down the back of your throat. Use nasal irrigation or a nasal spray to help clear away mucus and keep the nasal passages moist. Avoid things that you know you are allergic to (allergens), like dust, mold, pollen, pets, or certain foods. This information is not intended to replace advice given to you by your health care provider. Make sure you discuss any questions you have with your health care provider. Document Revised: 06/05/2021 Document Reviewed: 06/05/2021 Elsevier Patient Education  [place].

## 2021-12-23 NOTE — Telephone Encounter (Signed)
Patient returned referrals phone call. 

## 2021-12-23 NOTE — Progress Notes (Signed)
Chief Complaint  Patient presents with   Follow-up    BP, taking meds as prescribed, disc about throat issue, states she develops a lot of phlegm after eating certain foods. Requesting lab work today   F/u  1. Htn controlled on bystolic 2.5 mg qd check lipid panel today on zetia and crestor doing well  2. C/o phelgm in throat and sore throat with pain tried warm salt gargles ns and warm salt gargles she thinks allergies today and has xyzal  3. Ckd 3b saw renal 10/06/21 cbc normal cr 1.37 and gfr 39 and had UA  4. C/o brittle nails    Review of Systems  Constitutional:  Negative for weight loss.  HENT:  Positive for sore throat. Negative for hearing loss.        PND  Eyes:  Negative for blurred vision.  Respiratory:  Negative for shortness of breath.   Cardiovascular:  Negative for chest pain.  Gastrointestinal:  Negative for abdominal pain and blood in stool.  Genitourinary:  Negative for dysuria.  Musculoskeletal:  Negative for falls and joint pain.  Skin:  Negative for rash.  Neurological:  Negative for headaches.  Psychiatric/Behavioral:  Negative for depression.   Past Medical History:  Diagnosis Date   Anemia    Breast cancer ([place]) 2018   Left   Breast mass, left 09/22/2016   RECOMMENDATION: Ultrasound-guided core biopsies of masses at left breast 2 o'clock 2 cm from nipple, left breast 2 o'clock 5 cm from nipple, abnormal left axillary lymph nodes.   COPD (chronic obstructive pulmonary disease) (HCC)    Depression    GERD (gastroesophageal reflux disease)    History of chickenpox    Hx of adenomatous colonic polyps    Hyperglycemia    Hyperlipidemia    Obesity    Personal history of chemotherapy 2018   Left breast   Personal history of radiation therapy 2018   Postmenopausal    Shingles    right eye    Past Surgical History:  Procedure Laterality Date   ABDOMINAL HYSTERECTOMY     AXILLARY SENTINEL NODE BIOPSY Left 04/14/2017   Procedure: AXILLARY SENTINEL NODE  BIOPSY;  Surgeon: Olean Ree, MD;  Location: [facility] ORS;  Service: General;  Laterality: Left;   BREAST BIOPSY Left 09/30/2016   US biopsy of 3 areas + chemo   BREAST EXCISIONAL BIOPSY Left 04/14/2017   Left breast invasive cancer left mastectomy   COLONOSCOPY WITH PROPOFOL N/A 06/17/2020   Procedure: COLONOSCOPY WITH PROPOFOL;  Surgeon: Toledo, Benay Pike, MD;  Location: [facility] ENDOSCOPY;  Service: Gastroenterology;  Laterality: N/A;   ESOPHAGOGASTRODUODENOSCOPY (EGD) WITH PROPOFOL N/A 12/24/2014   Procedure: ESOPHAGOGASTRODUODENOSCOPY (EGD) WITH PROPOFOL;  Surgeon: Manya Silvas, MD;  Location: [facility] ENDOSCOPY;  Service: Endoscopy;  Laterality: N/A;   MASTECTOMY Left 04/14/2017   Left breast invasive cancer   PORTACATH PLACEMENT Right 10/27/2016   Procedure: INSERTION PORT-A-CATH;  Surgeon: Nestor Lewandowsky, MD;  Location: [facility] ORS;  Service: General;  Laterality: Right;   TONSILECTOMY, ADENOIDECTOMY, BILATERAL MYRINGOTOMY AND TUBES     TONSILLECTOMY     TOTAL MASTECTOMY Left 04/14/2017   Procedure: TOTAL MASTECTOMY;  Surgeon: Olean Ree, MD;  Location: [facility] ORS;  Service: General;  Laterality: Left;   Family History  Problem Relation Age of Onset   Stroke Mother    Stroke Father    Cancer Sister        with mets as of 09/18/20   Cancer Daughter  as of 05/06/21 dx ? cancer type 45   Depression Neg Hx    Breast cancer Neg Hx    Social History   Socioeconomic History   Marital status: Widowed    Spouse name: Not on file   Number of children: 1   Years of education: GED   Highest education level: 12th grade  Occupational History   Occupation: Retired  Tobacco Use   Smoking status: Never   Smokeless tobacco: Never   Tobacco comments:    smoking cessation materials not required  Vaping Use   Vaping Use: Never used  Substance and Sexual Activity   Alcohol use: No    Alcohol/week: 0.0 standard drinks   Drug use: No   Sexual activity: Not Currently  Other Topics Concern    Not on file  Social History Narrative   Lives at home    1 kid [name] (daughter) [phone]   Social Determinants of Health   Financial Resource Strain: Not on file  Food Insecurity: Not on file  Transportation Needs: Not on file  Physical Activity: Not on file  Stress: Not on file  Social Connections: Not on file  Intimate Partner Violence: Not on file   Current Meds  Medication Sig   acetaminophen (TYLENOL) 325 MG tablet Take 2 tablets (650 mg total) by mouth every 6 (six) hours as needed.   aspirin 81 MG tablet Take 81 mg by mouth. Takes every 3-4 days.   carbamide peroxide (DEBROX) 6.5 % OTIC solution Place 5 drops into the right ear 2 (two) times daily. X 4-7 days as needed right ear wax   Cholecalciferol (VITAMIN D) 2000 units tablet Take 2,000 Units by mouth 3 (three) times a week.    ferrous sulfate 325 (65 FE) MG EC tablet Take 325 mg by mouth 3 (three) times a week.    fluticasone (FLONASE) 50 MCG/ACT nasal spray Place 2 sprays into both nostrils daily as needed for allergies or rhinitis.   hydrocortisone 2.5 % lotion Apply topically 2 (two) times daily. Prn   levocetirizine (XYZAL) 5 MG tablet Take 0.5 tablets (2.5 mg total) by mouth every evening.   Multiple Vitamin (MULTIVITAMIN WITH MINERALS) TABS tablet Take 1 tablet by mouth 3 (three) times a week.    neomycin-polymyxin-hydrocortisone (CORTISPORIN) OTIC solution Place 4 drops into the right ear 3 (three) times daily. Right ear x 4-7 days after debrox   pantoprazole (PROTONIX) 40 MG tablet Take 1 tablet (40 mg total) by mouth daily. 30 minutes before food   Polyethyl Glycol-Propyl Glycol (SYSTANE OP) Place 1 drop into both eyes daily as needed (dry eyes).    sodium chloride (OCEAN) 0.65 % SOLN nasal spray Place 1 spray into both nostrils as needed for congestion.   [DISCONTINUED] ezetimibe (ZETIA) 10 MG tablet Take 1 tablet (10 mg total) by mouth daily.   [DISCONTINUED] nebivolol (BYSTOLIC) 2.5 MG tablet Take 1  tablet (2.5 mg total) by mouth daily.   [DISCONTINUED] rosuvastatin (CRESTOR) 20 MG tablet Take 1 tablet (20 mg total) by mouth daily. D/c lipitor 40   Allergies  Allergen Reactions   Latex Itching   Penicillins Itching and Rash    Has patient had a PCN reaction causing immediate rash, facial/tongue/throat swelling, SOB or lightheadedness with hypotension: Yes Has patient had a PCN reaction causing severe rash involving mucus membranes or skin necrosis: No Has patient had a PCN reaction that required hospitalization No Has patient had a PCN reaction occurring within the  last 10 years: No If all of the above answers are "NO", then may proceed with Cephalosporin use.    No results found for this or any previous visit (from the past 2160 hour(s)). Objective  Body mass index is 32.26 kg/m. Wt Readings from Last 3 Encounters:  12/23/21 176 lb 6.4 oz (80 kg)  10/10/21 180 lb 12.8 oz (82 kg)  10/07/21 177 lb 14.4 oz (80.7 kg)   Temp Readings from Last 3 Encounters:  12/23/21 98.2 F (36.8 C) (Oral)  10/10/21 98.3 F (36.8 C) (Oral)  10/07/21 (!) 97.2 F (36.2 C) (Tympanic)   BP Readings from Last 3 Encounters:  12/23/21 130/70  10/10/21 (!) 159/73  10/07/21 (!) 141/66   Pulse Readings from Last 3 Encounters:  12/23/21 68  10/10/21 (!) 51  10/07/21 (!) 58    Physical Exam Vitals and nursing note reviewed.  Constitutional:      Appearance: Normal appearance. She is well-developed and well-groomed.  HENT:     Head: Normocephalic and atraumatic.  Eyes:     Conjunctiva/sclera: Conjunctivae normal.     Pupils: Pupils are equal, round, and reactive to light.  Cardiovascular:     Rate and Rhythm: Normal rate and regular rhythm.     Heart sounds: Normal heart sounds. No murmur heard. Pulmonary:     Effort: Pulmonary effort is normal.     Breath sounds: Normal breath sounds.  Abdominal:     General: Abdomen is flat. Bowel sounds are normal.     Tenderness: There is no  abdominal tenderness.  Musculoskeletal:        General: No tenderness.  Skin:    General: Skin is warm and dry.  Neurological:     General: No focal deficit present.     Mental Status: She is alert and oriented to person, place, and time. Mental status is at baseline.     Cranial Nerves: Cranial nerves 2-12 are intact.     Motor: Motor function is intact.     Coordination: Coordination is intact.     Gait: Gait is intact.  Psychiatric:        Attention and Perception: Attention and perception normal.        Mood and Affect: Mood and affect normal.        Speech: Speech normal.        Behavior: Behavior normal. Behavior is cooperative.        Thought Content: Thought content normal.        Cognition and Memory: Cognition and memory normal.        Judgment: Judgment normal.    Assessment  Plan  Hypertension, unspecified type - Plan: Hepatic function panel, nebivolol (BYSTOLIC) 2.5 MG tablet  Hyperlipidemia, unspecified hyperlipidemia type - Plan: Lipid panel, ezetimibe (ZETIA) 10 MG tablet, rosuvastatin (CRESTOR) 20 MG tablet  Brittle nails - Plan: IBC + Ferritin Iron deficiency - Plan: IBC + Ferritin  Hyperglycemia - Plan: Hemoglobin A1c  Vitamin D deficiency - Plan: Vitamin D (25 hydroxy)  Dysphagia, unspecified type - Plan: CT SOFT TISSUE NECK WO CONTRAST Acute pharyngitis, unspecified etiology - Plan: CT SOFT TISSUE NECK WO CONTRAST Will not use contrast due to ckd 3b  Coronary artery disease involving native coronary artery of native heart without angina pectoris - Plan: nebivolol (BYSTOLIC) 2.5 MG tablet  Call and sch f/u leb cards givne # to do this   HM Flu shot utd 03/28/21 prevnar and pna 23 utd Tdap and shingrix disc in  future disc today she thinks she has had -ROI sent [facility] covid vx had 4/4   S/p left mastectomy 09/25/08 right mammo negative 09/26/18 reordered 2021  sch 09/30/20 c/o bone pain ? If should do bone scan/cancer markers with h/o  Chronic  lymphedema left upper arm  F/u due [place] in 10/03/2021    Out of age window pap   Colonoscopy had years ago Dr. Tiffany Kocher -02/04/11 sessile tubular adenoma/IH; EGD duodenal polyp 12/24/14 neg path, hiatal hernia Referred [facility] had 06/17/20 tubular adenoma f/u in 5 years    DEXA +osteoporosis try to get prolia approved had 09/2019 2nd dose in 03/2020 due   Started prolia pt thinks causing rash, joint pain and muscle aches    Dr. Candiss Norse f/u as of 05/06/21 due for f/u 09/15/21      Prior PCP Dr. Romana Juniper   Provider: Dr. Olivia Mackie McLean-Scocuzza-[facility]

## 2021-12-29 ENCOUNTER — Ambulatory Visit: Payer: Medicare Other | Admitting: Occupational Therapy

## 2021-12-29 DIAGNOSIS — I972 Postmastectomy lymphedema syndrome: Secondary | ICD-10-CM | POA: Diagnosis not present

## 2021-12-29 NOTE — Therapy (Signed)
[facility] [address], [place], [address] Phone: [phone]   Fax:  [phone]  Occupational Therapy Treatment  Patient Details  Name: Maria Patterson MRN: [id number] Date of Birth: Jun 08, 1941 No data recorded  Encounter Date: 12/29/2021   OT End of Session - 12/29/21 0929     Visit Number 13    Number of Visits 19    Date for OT Re-Evaluation 01/06/22    OT Start Time 0854    OT Stop Time 0922    OT Time Calculation (min) 28 min    Activity Tolerance Patient tolerated treatment well    Behavior During Therapy [facility] for tasks assessed/performed             Past Medical History:  Diagnosis Date   Anemia    Breast cancer ([place]) 2018   Left   Breast mass, left 09/22/2016   RECOMMENDATION: Ultrasound-guided core biopsies of masses at left breast 2 o'clock 2 cm from nipple, left breast 2 o'clock 5 cm from nipple, abnormal left axillary lymph nodes.   COPD (chronic obstructive pulmonary disease) (HCC)    Depression    GERD (gastroesophageal reflux disease)    History of chickenpox    Hx of adenomatous colonic polyps    Hyperglycemia    Hyperlipidemia    Obesity    Personal history of chemotherapy 2018   Left breast   Personal history of radiation therapy 2018   Postmenopausal    Shingles    right eye     Past Surgical History:  Procedure Laterality Date   ABDOMINAL HYSTERECTOMY     AXILLARY SENTINEL NODE BIOPSY Left 04/14/2017   Procedure: AXILLARY SENTINEL NODE BIOPSY;  Surgeon: Olean Ree, MD;  Location: [facility] ORS;  Service: General;  Laterality: Left;   BREAST BIOPSY Left 09/30/2016   US biopsy of 3 areas + chemo   BREAST EXCISIONAL BIOPSY Left 04/14/2017   Left breast invasive cancer left mastectomy   COLONOSCOPY WITH PROPOFOL N/A 06/17/2020   Procedure: COLONOSCOPY WITH PROPOFOL;  Surgeon: Toledo, Benay Pike, MD;  Location: [facility] ENDOSCOPY;  Service: Gastroenterology;  Laterality: N/A;    ESOPHAGOGASTRODUODENOSCOPY (EGD) WITH PROPOFOL N/A 12/24/2014   Procedure: ESOPHAGOGASTRODUODENOSCOPY (EGD) WITH PROPOFOL;  Surgeon: Manya Silvas, MD;  Location: [facility] ENDOSCOPY;  Service: Endoscopy;  Laterality: N/A;   MASTECTOMY Left 04/14/2017   Left breast invasive cancer   PORTACATH PLACEMENT Right 10/27/2016   Procedure: INSERTION PORT-A-CATH;  Surgeon: Nestor Lewandowsky, MD;  Location: [facility] ORS;  Service: General;  Laterality: Right;   TONSILECTOMY, ADENOIDECTOMY, BILATERAL MYRINGOTOMY AND TUBES     TONSILLECTOMY     TOTAL MASTECTOMY Left 04/14/2017   Procedure: TOTAL MASTECTOMY;  Surgeon: Olean Ree, MD;  Location: [facility] ORS;  Service: General;  Laterality: Left;    There were no vitals filed for this visit.   Subjective Assessment - 12/29/21 0928     Subjective  They measured me last week and then I did my old sleeve and old night sleeve with a bandage over and pump twice. I do not know you look at my arm and see what you think    Pertinent History Per chart review:  Pt is s/p total mastectomy 04/14/2017.  Completed neoadjuvant Taxotere, carbo, Herceptin and Perjeta on 02/17/2017.  Completed 1 year of Herceptin on 11/03/2017.  Would not benefit from aromatase inhibitor.Pt cellulitis started 06/03/18 in R forearm - did 3 rounds of antibiotics - appear last one improved symptoms -  DVT Doppler was negative then - she was seen since then 2 x to replace compression.  10/2021 pt reports she has not had any more episodes of cellulitis but feels her arm has continued to increase in size over time.  She reports being compliant with daytime and nighttime compression wear.    Patient Stated Goals I want my arm to get better and not have to wrap.    Currently in Pain? No/denies                 LYMPHEDEMA/ONCOLOGY QUESTIONNAIRE - 12/29/21 0001       Left Upper Extremity Lymphedema   15 cm Proximal to Olecranon Process 34.2 cm    10 cm Proximal to Olecranon Process 35.5 cm    Olecranon  Process 27.6 cm    15 cm Proximal to Ulnar Styloid Process 28 cm    10 cm Proximal to Ulnar Styloid Process 24.5 cm    Just Proximal to Ulnar Styloid Process 17.3 cm               Patient arrived with bandages  in bag -  Reports since she was measured  last week for new daytime custom compression sleeves and power sleeve for nighttime  - she wore her old sleeves with a bandage over as well as used pump twice a day.      Measurements taken -measurements about the same except distal forearm and wrist increased about half a centimeter. Recommended for OT to bandage patient for 48 hours to decrease that area. And then patient can switch back to her old sleeves with a bandage as well as use pump twice a day until next week. That weight patient is able to shower in between Application of Eucerin lotion on left upper extremity, apply some Hydrocortisone ointment to volar elbow  and Isotoner glove and stockinette Did 1 Rosidal foam from hand up to upper arm  6 cm short stretch bandage from wrist through the hand and wrist 3 times up to mid forearm.   8 cm from wrist 1 time through webspace over hand up to proximal to elbow.   Switch to new 10 cm short stretch from mid forearm circular to upper arm  .  Patient  reviewed again precautions as well as range of motion exercises.   Patient to keep bandages on f for 48 hours   Patient to follow-up in a week to assess circumference if patient is able to maintain circumference well waiting for custom new daytime compression and nighttime power sleeve            OT Education - 12/29/21 0929     Education Details Bandages precautions as well as home program    Person(s) Educated Patient    Methods Explanation;Demonstration    Comprehension Verbalized understanding;Returned demonstration              OT Short Term Goals - 12/09/21 0903       OT SHORT TERM GOAL #1   Title Pt will be independent with home management program for  lymphedema.    Baseline pt familiar with use of compression, exercises for home program but requires some adjustments to program to ensure compliance.- did do bandage over compreession bandage -but need and agree to 3 layer bandage this date for forearm to decongest    Time 3    Period Weeks    Status On-going    Target Date 12/30/21  OT Long Term Goals - 12/09/21 1034       OT LONG TERM GOAL #1   Title Pt will be independent with wear and care of new daytime compression sleeve and glove.    Baseline pt in need of new garment for daytime - did not decongest in forearm to 3-4cm difference in forearm    Time 4    Period Weeks    Status On-going    Target Date 01/06/22      OT LONG TERM GOAL #2   Title Pt to demonstrate a decrease in LUE measurements at all levels to decrease the risk of cellulitis and prepare the arm for fitting of new daytime sleeve.    Baseline current sleeve is approx 83 years old - pt is decongesting with  one bandage over night time and daytime compression in elbow and upper arm -but forearm still increase more than 4 cm - agree to do 3 layer bandage    Time 4    Period Weeks    Status On-going    Target Date 01/06/22                   Plan - 12/29/21 0929     Clinical Impression Statement Pt is a 81 yo old female with history of breast cancer with left mastectomy in 2018 and episodes of cellulitis.  Pt has been seen in the lymphedema clinic on several occasions over the years and was last scheduled to follow up in April 2022 to replace her day time sleeve. Pt returned on 10/30/21 with a flareup of lymphedema after not replacing her daytime compression sleeve. Patient measurements were significantly up compared to 2 years ago as well as compared to the right arm.  Patient wanted to try to use her existing daytime compression and nighttime with pump to decrease - did not want to bandaging.  She showed great decongestion in the upper arm and  the hand and wrist.  But was switch to bandagees to decongest wrist and  forearm that stayed increased 4.5 cm prior to bandages. Had great progress  with being bandage -decreasing circumference in wrist and forearm compared in the past and was measured last week for new day tim compression sleeve and powersleeve for night time garment. Pt did since then her old sleeves with bandage and pump 2 x day. DId increase some at wrist and distal forearm - but was able to maintain rest of measurements. Pt was bandage today and to keep for 48 hrs and then go into her old garments with her compression bandaging and pump twice a day until Monday.  We will reassess if she is maintaining her measurements or need to do bandaging again until custom sleeves comes in.  Pt would benefit from skilled OT services to maximize safety and independence in daily tasks.  Pt would benefit from complete decongestive therapy for the LUE and also fitting for a new daytime sleeve.    OT Occupational Profile and History Detailed Assessment- Review of Records and additional review of physical, cognitive, psychosocial history related to current functional performance    Occupational performance deficits (Please refer to evaluation for details): ADL's;Leisure;IADL's    Body Structure / Function / Physical Skills ADL;Edema;UE functional use    Rehab Potential Good    Clinical Decision Making Limited treatment options, no task modification necessary    Comorbidities Affecting Occupational Performance: May have comorbidities impacting occupational performance    Modification or Assistance to Complete  Evaluation  No modification of tasks or assist necessary to complete eval    OT Frequency 3x / week    OT Duration 4 weeks    OT Treatment/Interventions Self-care/ADL training;Manual lymph drainage;Patient/family education;Compression bandaging;Therapeutic exercise;Manual Therapy    Consulted and Agree with Plan of Care Patient              Patient will benefit from skilled therapeutic intervention in order to improve the following deficits and impairments:   Body Structure / Function / Physical Skills: ADL, Edema, UE functional use       Visit Diagnosis: Postmastectomy lymphedema syndrome    Problem List Patient Active Problem List   Diagnosis Date Noted   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   Cervical arthritis 02/04/2021   History of breast cancer 02/04/2021   Spider vein of lower extremity 12/20/2020   Lymphedema of arm 09/18/2020   Chronic right shoulder pain 09/18/2020   Tubular adenoma 01/18/2020   Osteoporosis 05/12/2019   Anxiety and depression 05/12/2019   Herpes 05/12/2019   Dermatitis 05/12/2019   Colon polyps 03/17/2019   Pre-diabetes 02/19/2019   H/O total mastectomy of left breast 06/03/2018   Obesity (BMI 30.0-34.9) 03/08/2018   Insomnia 09/26/2017   Stage 3b chronic kidney disease ([place]) 08/09/2017   Hypomagnesemia 05/04/2017   Aortic atherosclerosis ([place]) 05/04/2017   Compression fracture of first lumbar vertebra ([place]) 05/04/2017   Degenerative disc disease, lumbar 05/04/2017   Anemia 02/01/2017   Atherosclerosis of native arteries of extremity with intermittent claudication ([place]) 11/09/2016   Allergic rhinitis 11/07/2016   Malignant neoplasm of upper-outer quadrant of left female breast ([place]) 10/08/2016   Postmenopausal 09/18/2016   Ache in joint 03/09/2016   Gastroesophageal reflux disease 07/01/2012   Hyperlipidemia LDL goal <100 07/01/2012    Rosalyn Gess, OTR/L,CLT 12/29/2021, 9:34 AM  [place] [facility] [address] [place], [address] Phone: [phone]   Fax:  [phone]  Name: Yukie Bergeron MRN: [id number] Date of Birth: 01/18/1941

## 2021-12-31 ENCOUNTER — Ambulatory Visit: Admission: RE | Admit: 2021-12-31 | Payer: Medicare Other | Source: Ambulatory Visit

## 2022-01-05 ENCOUNTER — Ambulatory Visit: Payer: Medicare Other | Admitting: Occupational Therapy

## 2022-01-05 DIAGNOSIS — I972 Postmastectomy lymphedema syndrome: Secondary | ICD-10-CM

## 2022-01-05 NOTE — Therapy (Signed)
[place] [facility] [address] [place], [place], [address] Phone: [phone]   Fax:  [phone]  Occupational Therapy Treatment  Patient Details  Name: Maria Patterson MRN: [id number] Date of Birth: 12/08/1940 No data recorded  Encounter Date: 01/05/2022   OT End of Session - 01/05/22 0852     Visit Number 14    Number of Visits 19    Date for OT Re-Evaluation 01/06/22    OT Start Time 0820    OT Stop Time 0847    OT Time Calculation (min) 27 min    Activity Tolerance Patient tolerated treatment well    Behavior During Therapy [facility] for tasks assessed/performed             Past Medical History:  Diagnosis Date   Anemia    Breast cancer ([place]) 2018   Left   Breast mass, left 09/22/2016   RECOMMENDATION: Ultrasound-guided core biopsies of masses at left breast 2 o'clock 2 cm from nipple, left breast 2 o'clock 5 cm from nipple, abnormal left axillary lymph nodes.   COPD (chronic obstructive pulmonary disease) (HCC)    Depression    GERD (gastroesophageal reflux disease)    History of chickenpox    Hx of adenomatous colonic polyps    Hyperglycemia    Hyperlipidemia    Obesity    Personal history of chemotherapy 2018   Left breast   Personal history of radiation therapy 2018   Postmenopausal    Shingles    right eye     Past Surgical History:  Procedure Laterality Date   ABDOMINAL HYSTERECTOMY     AXILLARY SENTINEL NODE BIOPSY Left 04/14/2017   Procedure: AXILLARY SENTINEL NODE BIOPSY;  Surgeon: Olean Ree, MD;  Location: [facility] ORS;  Service: General;  Laterality: Left;   BREAST BIOPSY Left 09/30/2016   US biopsy of 3 areas + chemo   BREAST EXCISIONAL BIOPSY Left 04/14/2017   Left breast invasive cancer left mastectomy   COLONOSCOPY WITH PROPOFOL N/A 06/17/2020   Procedure: COLONOSCOPY WITH PROPOFOL;  Surgeon: Toledo, Benay Pike, MD;  Location: [facility] ENDOSCOPY;  Service: Gastroenterology;  Laterality: N/A;    ESOPHAGOGASTRODUODENOSCOPY (EGD) WITH PROPOFOL N/A 12/24/2014   Procedure: ESOPHAGOGASTRODUODENOSCOPY (EGD) WITH PROPOFOL;  Surgeon: Manya Silvas, MD;  Location: [facility] ENDOSCOPY;  Service: Endoscopy;  Laterality: N/A;   MASTECTOMY Left 04/14/2017   Left breast invasive cancer   PORTACATH PLACEMENT Right 10/27/2016   Procedure: INSERTION PORT-A-CATH;  Surgeon: Nestor Lewandowsky, MD;  Location: [facility] ORS;  Service: General;  Laterality: Right;   TONSILECTOMY, ADENOIDECTOMY, BILATERAL MYRINGOTOMY AND TUBES     TONSILLECTOMY     TOTAL MASTECTOMY Left 04/14/2017   Procedure: TOTAL MASTECTOMY;  Surgeon: Olean Ree, MD;  Location: [facility] ORS;  Service: General;  Laterality: Left;    There were no vitals filed for this visit.   Subjective Assessment - 01/05/22 0852     Subjective  I am so tired of doing this.  Do I have to do this for the rest of my life.  I done everything you told me    Pertinent History Per chart review:  Pt is s/p total mastectomy 04/14/2017.  Completed neoadjuvant Taxotere, carbo, Herceptin and Perjeta on 02/17/2017.  Completed 1 year of Herceptin on 11/03/2017.  Would not benefit from aromatase inhibitor.Pt cellulitis started 06/03/18 in R forearm - did 3 rounds of antibiotics - appear last one improved symptoms - DVT Doppler was negative then - she was seen  since then 2 x to replace compression.  10/2021 pt reports she has not had any more episodes of cellulitis but feels her arm has continued to increase in size over time.  She reports being compliant with daytime and nighttime compression wear.    Patient Stated Goals I want my arm to get better and not have to wrap.    Currently in Pain? No/denies                 LYMPHEDEMA/ONCOLOGY QUESTIONNAIRE - 01/05/22 0001       Left Upper Extremity Lymphedema   15 cm Proximal to Olecranon Process 34 cm    10 cm Proximal to Olecranon Process 34.5 cm    Olecranon Process 27.6 cm    15 cm Proximal to Ulnar Styloid Process 28 cm     10 cm Proximal to Ulnar Styloid Process 24 cm    Just Proximal to Ulnar Styloid Process 17.4 cm    Across Hand at [employer] 19.4 cm                Patient arrived with  her 8 cm and 10 cm short stretch bandages  in bag -  Reports since she was measured  2 wks ago  for new daytime custom compression sleeves and power sleeve for nighttime  - she wore her old sleeves with a bandage over as well as used pump twice a day.  Except for the 48 hours the patient was bandaged by this OT last Monday.      Measurements taken -measurements about the same  Recommended for OT to bandage patient for 48 hours to decrease that area. And then patient can switch back to her old sleeves with a bandage as well as use pump twice a day until next Monday or if garments comes in the end of this week can switch to them.  That way patient is able to shower in between Application of Eucerin lotion on left upper extremity, apply some Hydrocortisone ointment to volar elbow  and Isotoner glove and stockinette Did  new 1 Rosidal foam from hand up to upper arm   new 6 cm short stretch bandage from wrist through the hand and wrist 3 times up to mid forearm.   8 cm from wrist 1 time through webspace over hand up to proximal to elbow.   10 cm short stretch from mid forearm circular to upper arm  .  Patient  reviewed again precautions as well as range of motion exercises.   Patient to keep bandages on f for 48 hours   Patient to follow-up in a week to assess circumference if patient is able to maintain circumference well waiting for custom new daytime compression and nighttime power sleeve Or can start wearing new compression garments if it comes in the end of this week.  Contact me earlier if any issues.               OT Education - 01/05/22 [phone]     Education Details Bandages precautions as well as home program    Methods Explanation;Demonstration    Comprehension Verbalized  understanding;Returned demonstration              OT Short Term Goals - 12/09/21 0903       OT SHORT TERM GOAL #1   Title Pt will be independent with home management program for lymphedema.    Baseline pt familiar with use of compression, exercises for home program  but requires some adjustments to program to ensure compliance.- did do bandage over compreession bandage -but need and agree to 3 layer bandage this date for forearm to decongest    Time 3    Period Weeks    Status On-going    Target Date 12/30/21               OT Long Term Goals - 12/09/21 1034       OT LONG TERM GOAL #1   Title Pt will be independent with wear and care of new daytime compression sleeve and glove.    Baseline pt in need of new garment for daytime - did not decongest in forearm to 3-4cm difference in forearm    Time 4    Period Weeks    Status On-going    Target Date 01/06/22      OT LONG TERM GOAL #2   Title Pt to demonstrate a decrease in LUE measurements at all levels to decrease the risk of cellulitis and prepare the arm for fitting of new daytime sleeve.    Baseline current sleeve is approx 83 years old - pt is decongesting with  one bandage over night time and daytime compression in elbow and upper arm -but forearm still increase more than 4 cm - agree to do 3 layer bandage    Time 4    Period Weeks    Status On-going    Target Date 01/06/22                   Plan - 01/05/22 0852     Clinical Impression Statement Pt is a 81 yo old female with history of breast cancer with left mastectomy in 2018 and episodes of cellulitis.  Pt has been seen in the lymphedema clinic on several occasions over the years and was last scheduled to follow up in April 2022 to replace her day time sleeve. Pt returned on 10/30/21 with a flareup of lymphedema after not replacing her daytime compression sleeve. Patient measurements were significantly up compared to 2 years ago as well as compared to the  right arm.  Patient wanted to try to use her existing daytime compression and nighttime with pump to decrease - did not want to bandaging.  She showed great decongestion in the upper arm and the hand and wrist.  But was switch to bandagees to decongest wrist and  forearm that stayed increased 4.5 cm prior to bandages. Had great progress  with being bandage -decreasing circumference in wrist and forearm compared in the past and was measured 2 wks ago for new day time compression sleeve and powersleeve for night time garment. Pt maintaining her circumference with her old sleeves with bandage and pump 2 x day.  Checking in with OT once a week to monitor or if she is able to maintain circumference to fit in her garments when the kitchen. Pt was bandage today and to keep for 48 hrs and then go into her old garments with her compression bandaging and pump twice a day until Monday or if her compression garments comes in the end of the week and switch to them.  Pt would benefit from skilled OT services to maximize safety and independence in daily tasks.  Pt would benefit from complete decongestive therapy for the LUE and also fitting for a new daytime sleeve.    OT Occupational Profile and History Detailed Assessment- Review of Records and additional review of physical, cognitive, psychosocial history related  to current functional performance    Occupational performance deficits (Please refer to evaluation for details): ADL's;Leisure;IADL's    Body Structure / Function / Physical Skills ADL;Edema;UE functional use    Rehab Potential Good    Clinical Decision Making Limited treatment options, no task modification necessary    Comorbidities Affecting Occupational Performance: May have comorbidities impacting occupational performance    Modification or Assistance to Complete Evaluation  No modification of tasks or assist necessary to complete eval    OT Frequency 3x / week    OT Duration 4 weeks    OT  Treatment/Interventions Self-care/ADL training;Manual lymph drainage;Patient/family education;Compression bandaging;Therapeutic exercise;Manual Therapy    Consulted and Agree with Plan of Care Patient             Patient will benefit from skilled therapeutic intervention in order to improve the following deficits and impairments:   Body Structure / Function / Physical Skills: ADL, Edema, UE functional use       Visit Diagnosis: Postmastectomy lymphedema syndrome    Problem List Patient Active Problem List   Diagnosis Date Noted   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   Cervical arthritis 02/04/2021   History of breast cancer 02/04/2021   Spider vein of lower extremity 12/20/2020   Lymphedema of arm 09/18/2020   Chronic right shoulder pain 09/18/2020   Tubular adenoma 01/18/2020   Osteoporosis 05/12/2019   Anxiety and depression 05/12/2019   Herpes 05/12/2019   Dermatitis 05/12/2019   Colon polyps 03/17/2019   Pre-diabetes 02/19/2019   H/O total mastectomy of left breast 06/03/2018   Obesity (BMI 30.0-34.9) 03/08/2018   Insomnia 09/26/2017   Stage 3b chronic kidney disease ([place]) 08/09/2017   Hypomagnesemia 05/04/2017   Aortic atherosclerosis ([place]) 05/04/2017   Compression fracture of first lumbar vertebra ([place]) 05/04/2017   Degenerative disc disease, lumbar 05/04/2017   Anemia 02/01/2017   Atherosclerosis of native arteries of extremity with intermittent claudication ([place]) 11/09/2016   Allergic rhinitis 11/07/2016   Malignant neoplasm of upper-outer quadrant of left female breast ([place]) 10/08/2016   Postmenopausal 09/18/2016   Ache in joint 03/09/2016   Gastroesophageal reflux disease 07/01/2012   Hyperlipidemia LDL goal <100 07/01/2012    Rosalyn Gess, OTR/L,CLT 01/05/2022, 8:56 AM  [facility] [place] [address] [place], [address] Phone: [phone]   Fax:  [phone]  Name:  Maria Patterson MRN: [id number] Date of Birth: February 07, 1941

## 2022-01-12 ENCOUNTER — Ambulatory Visit: Payer: Medicare Other | Admitting: Occupational Therapy

## 2022-01-12 DIAGNOSIS — I972 Postmastectomy lymphedema syndrome: Secondary | ICD-10-CM

## 2022-01-27 ENCOUNTER — Ambulatory Visit: Payer: Medicare Other | Attending: Oncology | Admitting: Occupational Therapy

## 2022-01-27 DIAGNOSIS — I972 Postmastectomy lymphedema syndrome: Secondary | ICD-10-CM | POA: Insufficient documentation

## 2022-01-27 NOTE — Therapy (Signed)
[facility] [address], [place], [address] Phone: [phone]   Fax:  [phone]  Occupational Therapy Treatment  Patient Details  Name: Maria Patterson MRN: [id number] Date of Birth: Dec 10, 1940 No data recorded  Encounter Date: 01/27/2022   OT End of Session - 01/27/22 0928     Visit Number 16    Number of Visits 19    Date for OT Re-Evaluation 03/10/22    OT Start Time 0901    OT Stop Time 0918    OT Time Calculation (min) 17 min    Activity Tolerance Patient tolerated treatment well    Behavior During Therapy [facility] for tasks assessed/performed             Past Medical History:  Diagnosis Date   Anemia    Breast cancer ([place]) 2018   Left   Breast mass, left 09/22/2016   RECOMMENDATION: Ultrasound-guided core biopsies of masses at left breast 2 o'clock 2 cm from nipple, left breast 2 o'clock 5 cm from nipple, abnormal left axillary lymph nodes.   COPD (chronic obstructive pulmonary disease) (HCC)    Depression    GERD (gastroesophageal reflux disease)    History of chickenpox    Hx of adenomatous colonic polyps    Hyperglycemia    Hyperlipidemia    Obesity    Personal history of chemotherapy 2018   Left breast   Personal history of radiation therapy 2018   Postmenopausal    Shingles    right eye     Past Surgical History:  Procedure Laterality Date   ABDOMINAL HYSTERECTOMY     AXILLARY SENTINEL NODE BIOPSY Left 04/14/2017   Procedure: AXILLARY SENTINEL NODE BIOPSY;  Surgeon: Olean Ree, MD;  Location: [facility] ORS;  Service: General;  Laterality: Left;   BREAST BIOPSY Left 09/30/2016   US biopsy of 3 areas + chemo   BREAST EXCISIONAL BIOPSY Left 04/14/2017   Left breast invasive cancer left mastectomy   COLONOSCOPY WITH PROPOFOL N/A 06/17/2020   Procedure: COLONOSCOPY WITH PROPOFOL;  Surgeon: Toledo, Benay Pike, MD;  Location: [facility] ENDOSCOPY;  Service: Gastroenterology;  Laterality: N/A;    ESOPHAGOGASTRODUODENOSCOPY (EGD) WITH PROPOFOL N/A 12/24/2014   Procedure: ESOPHAGOGASTRODUODENOSCOPY (EGD) WITH PROPOFOL;  Surgeon: Manya Silvas, MD;  Location: [facility] ENDOSCOPY;  Service: Endoscopy;  Laterality: N/A;   MASTECTOMY Left 04/14/2017   Left breast invasive cancer   PORTACATH PLACEMENT Right 10/27/2016   Procedure: INSERTION PORT-A-CATH;  Surgeon: Nestor Lewandowsky, MD;  Location: [facility] ORS;  Service: General;  Laterality: Right;   TONSILECTOMY, ADENOIDECTOMY, BILATERAL MYRINGOTOMY AND TUBES     TONSILLECTOMY     TOTAL MASTECTOMY Left 04/14/2017   Procedure: TOTAL MASTECTOMY;  Surgeon: Olean Ree, MD;  Location: [facility] ORS;  Service: General;  Laterality: Left;    There were no vitals filed for this visit.   Subjective Assessment - 01/27/22 0926     Subjective  Wearing the new day sleeve and glove and able to put it on myself.  Using the pump twice a day and then also by nighttime garment.  But I did not get my outer sleeve for nighttime yet.    Pertinent History Per chart review:  Pt is s/p total mastectomy 04/14/2017.  Completed neoadjuvant Taxotere, carbo, Herceptin and Perjeta on 02/17/2017.  Completed 1 year of Herceptin on 11/03/2017.  Would not benefit from aromatase inhibitor.Pt cellulitis started 06/03/18 in R forearm - did 3 rounds of antibiotics - appear last one  improved symptoms - DVT Doppler was negative then - she was seen since then 2 x to replace compression.  10/2021 pt reports she has not had any more episodes of cellulitis but feels her arm has continued to increase in size over time.  She reports being compliant with daytime and nighttime compression wear.    Patient Stated Goals I want my arm to get better and not have to wrap.    Currently in Pain? No/denies                 LYMPHEDEMA/ONCOLOGY QUESTIONNAIRE - 01/27/22 0001       Left Upper Extremity Lymphedema   15 cm Proximal to Olecranon Process 34 cm    10 cm Proximal to Olecranon Process 35 cm     Olecranon Process 27.8 cm    15 cm Proximal to Ulnar Styloid Process 29 cm    10 cm Proximal to Ulnar Styloid Process 26 cm    Just Proximal to Ulnar Styloid Process 17.5 cm    Across Hand at [employer] 18.5 cm               Patient arrived this date after wearing for about a week her new [employer] custom compression sleeve and glove. As well as using her pump.  2 times a day. And nighttime compression sleeve.  Patient reports did not get her out of her compression sleeve for nighttime yet OT email today rep from [employer] about arrival date 4 out of sleeve for nighttime garment. Patient's forearm increased but at 1 cm and elbow and upper arm about half a centimeter. Patient to bring in her nighttime compression sleeve next week for OT to readjust forearm that may be was not adjusted after she decongested with bandaging at the forearm. Patient decongesting was delayed because patient did not want to be bandage and wanted to use her existing garments with bandaging over. Dr. Edd Arbour did do a great decongesting with bandages for the last 2 to 3 weeks before measuring for daytime compression.              OT Education - 01/27/22 3810     Education Details Wearing of compression as well as using pump.    Person(s) Educated Patient    Methods Explanation;Demonstration    Comprehension Verbalized understanding;Returned demonstration              OT Short Term Goals - 01/12/22 0848       OT SHORT TERM GOAL #1   Title Pt will be independent with home management program for lymphedema.    Status Achieved               OT Long Term Goals - 01/12/22 0848       OT LONG TERM GOAL #1   Title Pt will be independent with wear and care of new daytime compression sleeve and glove.    Baseline 6Awaiting new compression garments.Was measured waiting to be fitted.    Period Weeks    Status On-going    Target Date 03/02/22      OT LONG TERM GOAL #2   Title Pt  to demonstrate a decrease in LUE measurements at all levels to decrease the risk of cellulitis and prepare the arm for fitting of new daytime sleeve.    Status Achieved                   Plan - 01/27/22 1751  Clinical Impression Statement Pt is a 81 yo old female with history of breast cancer with left mastectomy in 2018 and episodes of cellulitis.  Pt has been seen in the lymphedema clinic on several occasions over the years and was last scheduled to follow up in April 2022 to replace her day time sleeve. Pt returned on 10/30/21 with a flareup of lymphedema after not replacing her daytime compression sleeve. Patient measurements were significantly up compared to 2 years ago as well as compared to the right arm.  Patient wanted to try to use her existing daytime compression and nighttime with pump to decrease - did not want to bandaging.  She showed great decongestion in the upper arm.  Patient was switched to bandages thing and did do some good decongesting and forearm to get ready for measurements.  Patient was fitted with a new [employer] soft sleeve and glove last week.  But still waiting for our sleeve for nighttime garment.  Patient arrived this date with measurements increased close to a centimeter in the forearm and less than half a centimeter in the elbow and upper arm.  Patient to check back with me next week for adjusting night sleeves forearms straps that may be was not adjusted after decongesting with bandages..  Also emailed DME company about date of arrival for outer sleeve for nighttime.. Pt would benefit from skilled OT services to to monitor if new compression garments are containing her lymphedema and preventing future flareups or infections.    OT Occupational Profile and History Detailed Assessment- Review of Records and additional review of physical, cognitive, psychosocial history related to current functional performance    Occupational performance deficits (Please  refer to evaluation for details): ADL's;Leisure;IADL's    Body Structure / Function / Physical Skills ADL;Edema;UE functional use    Rehab Potential Good    Clinical Decision Making Limited treatment options, no task modification necessary    Comorbidities Affecting Occupational Performance: May have comorbidities impacting occupational performance    Modification or Assistance to Complete Evaluation  No modification of tasks or assist necessary to complete eval    OT Frequency 1x / week    OT Duration 6 weeks    OT Treatment/Interventions Self-care/ADL training;Manual lymph drainage;Patient/family education;Compression bandaging;Therapeutic exercise;Manual Therapy    Consulted and Agree with Plan of Care Patient             Patient will benefit from skilled therapeutic intervention in order to improve the following deficits and impairments:   Body Structure / Function / Physical Skills: ADL, Edema, UE functional use       Visit Diagnosis: Postmastectomy lymphedema syndrome    Problem List Patient Active Problem List   Diagnosis Date Noted   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   Cervical arthritis 02/04/2021   History of breast cancer 02/04/2021   Spider vein of lower extremity 12/20/2020   Lymphedema of arm 09/18/2020   Chronic right shoulder pain 09/18/2020   Tubular adenoma 01/18/2020   Osteoporosis 05/12/2019   Anxiety and depression 05/12/2019   Herpes 05/12/2019   Dermatitis 05/12/2019   Colon polyps 03/17/2019   Pre-diabetes 02/19/2019   H/O total mastectomy of left breast 06/03/2018   Obesity (BMI 30.0-34.9) 03/08/2018   Insomnia 09/26/2017   Stage 3b chronic kidney disease ([place]) 08/09/2017   Hypomagnesemia 05/04/2017   Aortic atherosclerosis ([place]) 05/04/2017   Compression fracture of first lumbar vertebra ([place]) 05/04/2017   Degenerative disc disease, lumbar 05/04/2017   Anemia 02/01/2017  Atherosclerosis of native arteries of extremity with  intermittent claudication ([place]) 11/09/2016   Allergic rhinitis 11/07/2016   Malignant neoplasm of upper-outer quadrant of left female breast ([place]) 10/08/2016   Postmenopausal 09/18/2016   Ache in joint 03/09/2016   Gastroesophageal reflux disease 07/01/2012   Hyperlipidemia LDL goal <100 07/01/2012    Rosalyn Gess, OTR/L,CLT 01/27/2022, 9:33 AM  [facility] [address] [place], [address] Phone: [phone]   Fax:  [phone]  Name: Taiyana Kissler MRN: [id number] Date of Birth: 01/17/1941

## 2022-02-03 ENCOUNTER — Encounter: Payer: Self-pay | Admitting: Occupational Therapy

## 2022-02-03 ENCOUNTER — Ambulatory Visit: Payer: Medicare Other | Admitting: Occupational Therapy

## 2022-02-03 DIAGNOSIS — I972 Postmastectomy lymphedema syndrome: Secondary | ICD-10-CM

## 2022-02-03 NOTE — Therapy (Signed)
[facility] [address], [place], [address] Phone: [phone]   Fax:  [phone]  Occupational Therapy Treatment  Patient Details  Name: Maria Patterson MRN: [id number] Date of Birth: [birth year] No data recorded  Encounter Date: 02/03/2022   OT End of Session - 02/03/22 0930     Visit Number 17    Number of Visits 19    Date for OT Re-Evaluation 03/10/22    OT Start Time 0905    OT Stop Time 0925    OT Time Calculation (min) 20 min    Activity Tolerance Patient tolerated treatment well    Behavior During Therapy [facility] for tasks assessed/performed             Past Medical History:  Diagnosis Date   Anemia    Breast cancer ([place]) 2018   Left   Breast mass, left 09/22/2016   RECOMMENDATION: Ultrasound-guided core biopsies of masses at left breast 2 o'clock 2 cm from nipple, left breast 2 o'clock 5 cm from nipple, abnormal left axillary lymph nodes.   COPD (chronic obstructive pulmonary disease) (HCC)    Depression    GERD (gastroesophageal reflux disease)    History of chickenpox    Hx of adenomatous colonic polyps    Hyperglycemia    Hyperlipidemia    Obesity    Personal history of chemotherapy 2018   Left breast   Personal history of radiation therapy 2018   Postmenopausal    Shingles    right eye     Past Surgical History:  Procedure Laterality Date   ABDOMINAL HYSTERECTOMY     AXILLARY SENTINEL NODE BIOPSY Left 04/14/2017   Procedure: AXILLARY SENTINEL NODE BIOPSY;  Surgeon: Olean Ree, MD;  Location: [facility] ORS;  Service: General;  Laterality: Left;   BREAST BIOPSY Left 09/30/2016   US biopsy of 3 areas + chemo   BREAST EXCISIONAL BIOPSY Left 04/14/2017   Left breast invasive cancer left mastectomy   COLONOSCOPY WITH PROPOFOL N/A 06/17/2020   Procedure: COLONOSCOPY WITH PROPOFOL;  Surgeon: Toledo, Benay Pike, MD;  Location: [facility] ENDOSCOPY;  Service: Gastroenterology;  Laterality: N/A;    ESOPHAGOGASTRODUODENOSCOPY (EGD) WITH PROPOFOL N/A 12/24/2014   Procedure: ESOPHAGOGASTRODUODENOSCOPY (EGD) WITH PROPOFOL;  Surgeon: Manya Silvas, MD;  Location: [facility] ENDOSCOPY;  Service: Endoscopy;  Laterality: N/A;   MASTECTOMY Left 04/14/2017   Left breast invasive cancer   PORTACATH PLACEMENT Right 10/27/2016   Procedure: INSERTION PORT-A-CATH;  Surgeon: Nestor Lewandowsky, MD;  Location: [facility] ORS;  Service: General;  Laterality: Right;   TONSILECTOMY, ADENOIDECTOMY, BILATERAL MYRINGOTOMY AND TUBES     TONSILLECTOMY     TOTAL MASTECTOMY Left 04/14/2017   Procedure: TOTAL MASTECTOMY;  Surgeon: Olean Ree, MD;  Location: [facility] ORS;  Service: General;  Laterality: Left;    There were no vitals filed for this visit.   Subjective Assessment - 02/03/22 0929     Subjective  I had a hard time getting up this morning.  I did get my power sleeve for my night time garment.    Pertinent History Per chart review:  Pt is s/p total mastectomy 04/14/2017.  Completed neoadjuvant Taxotere, carbo, Herceptin and Perjeta on 02/17/2017.  Completed 1 year of Herceptin on 11/03/2017.  Would not benefit from aromatase inhibitor.Pt cellulitis started 06/03/18 in R forearm - did 3 rounds of antibiotics - appear last one improved symptoms - DVT Doppler was negative then - she was seen since then 2 x to replace  compression.  10/2021 pt reports she has not had any more episodes of cellulitis but feels her arm has continued to increase in size over time.  She reports being compliant with daytime and nighttime compression wear.    Patient Stated Goals I want my arm to get better and not have to wrap.    Currently in Pain? No/denies                 LYMPHEDEMA/ONCOLOGY QUESTIONNAIRE - 02/03/22 0001       Left Upper Extremity Lymphedema   15 cm Proximal to Olecranon Process 33.5 cm    10 cm Proximal to Olecranon Process 35 cm    Olecranon Process 27.6 cm    15 cm Proximal to Ulnar Styloid Process 30 cm    10 cm  Proximal to Ulnar Styloid Process 26.5 cm    Just Proximal to Ulnar Styloid Process 17 cm    Across Hand at [employer] 19 cm               Patient arrived this date coming in with her [employer] soft sleeve.  Glove in bag patient report feeling well.   Last week patient's forearm was increased after wearing the new [employer] soft for a week.    Patient reported using her pump 2 times a day. And she is bringing in her nighttime compression sleeve.  She did pick up her new power sleeve.  Patient's forearm continues to be increased compared to prior to fitting with daytime compression. Nighttime compression sleeve -OT adjusted straps on forearm that was may be not adjusted after she decongested with bandaging at the forearm. After wearing 5 minutes patient did show some lymphatic drainage and forearm. Recommend for patient to wear it if she is around the house during the day to decongest forearm in the next 3 weeks Patient to follow-up with me in 3 weeks for possible discharge.  Patient also to put on calendar for January 2024 to replace her garment.             OT Education - 02/03/22 0930     Education Details Wearing of compression as well as using pump.    Person(s) Educated Patient    Methods Explanation;Demonstration    Comprehension Verbalized understanding;Returned demonstration              OT Short Term Goals - 01/12/22 0848       OT SHORT TERM GOAL #1   Title Pt will be independent with home management program for lymphedema.    Status Achieved               OT Long Term Goals - 01/12/22 0848       OT LONG TERM GOAL #1   Title Pt will be independent with wear and care of new daytime compression sleeve and glove.    Baseline 6Awaiting new compression garments.Was measured waiting to be fitted.    Period Weeks    Status On-going    Target Date 03/02/22      OT LONG TERM GOAL #2   Title Pt to demonstrate a decrease in LUE  measurements at all levels to decrease the risk of cellulitis and prepare the arm for fitting of new daytime sleeve.    Status Achieved                   Plan - 02/03/22 0930     Clinical Impression Statement Pt  is a 81 yo old female with history of breast cancer with left mastectomy in 2018 and episodes of cellulitis.  Pt has been seen in the lymphedema clinic on several occasions over the years and was last scheduled to follow up in April 2022 to replace her day time sleeve. Pt returned on 10/30/21 with a flareup of lymphedema after not replacing her daytime compression sleeve. Patient measurements were significantly up compared to 2 years ago as well as compared to the right arm.  Patient took a little bit longer to decongest left upper extremity because she did not want to be bandage.  CLT used her pump and existing garments with a bandage over.  But at the end patient did allow CLT to do bandaging with great success.  Patient was fitted with a new [employer] soft sleeve and glove 2 weeks ago.  Upon assessment for containment of new garments patient's forearm was increased last week.  Patient returned this date to reassess nighttime compression on forearm-adjustments done and patient educated on forearm needs to have more compression than upper arm.  With only patient wearing it in the clinic 5 minutes patient showed some lymphatic decongesting and forearm.  Patient also received her power sleeve for nighttime garment.  Patient educated on wearing of nighttime garment for the next 3 weeks some during the day to decongest forearm and will follow-up with me in 3 weeks. Pt would benefit from skilled OT services to to monitor if new compression garments are containing her lymphedema and preventing future flareups or infections.  Patient to put on her calendar to follow-up in January again for replacing her garment.  Possible discharge next session.    OT Occupational Profile and History Detailed  Assessment- Review of Records and additional review of physical, cognitive, psychosocial history related to current functional performance    Occupational performance deficits (Please refer to evaluation for details): ADL's;Leisure;IADL's    Body Structure / Function / Physical Skills ADL;Edema;UE functional use    Rehab Potential Good    Clinical Decision Making Limited treatment options, no task modification necessary    Comorbidities Affecting Occupational Performance: May have comorbidities impacting occupational performance    Modification or Assistance to Complete Evaluation  No modification of tasks or assist necessary to complete eval    OT Frequency Biweekly    OT Duration 4 weeks    OT Treatment/Interventions Self-care/ADL training;Manual lymph drainage;Patient/family education;Compression bandaging;Therapeutic exercise;Manual Therapy    Consulted and Agree with Plan of Care Patient             Patient will benefit from skilled therapeutic intervention in order to improve the following deficits and impairments:   Body Structure / Function / Physical Skills: ADL, Edema, UE functional use       Visit Diagnosis: Postmastectomy lymphedema syndrome    Problem List Patient Active Problem List   Diagnosis Date Noted   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   Cervical arthritis 02/04/2021   History of breast cancer 02/04/2021   Spider vein of lower extremity 12/20/2020   Lymphedema of arm 09/18/2020   Chronic right shoulder pain 09/18/2020   Tubular adenoma 01/18/2020   Osteoporosis 05/12/2019   Anxiety and depression 05/12/2019   Herpes 05/12/2019   Dermatitis 05/12/2019   Colon polyps 03/17/2019   Pre-diabetes 02/19/2019   H/O total mastectomy of left breast 06/03/2018   Obesity (BMI 30.0-34.9) 03/08/2018   Insomnia 09/26/2017   Stage 3b chronic kidney disease ([place]) 08/09/2017  Hypomagnesemia 05/04/2017   Aortic atherosclerosis ([place]) 05/04/2017    Compression fracture of first lumbar vertebra ([place]) 05/04/2017   Degenerative disc disease, lumbar 05/04/2017   Anemia 02/01/2017   Atherosclerosis of native arteries of extremity with intermittent claudication ([place]) 11/09/2016   Allergic rhinitis 11/07/2016   Malignant neoplasm of upper-outer quadrant of left female breast ([place]) 10/08/2016   Postmenopausal 09/18/2016   Ache in joint 03/09/2016   Gastroesophageal reflux disease 07/01/2012   Hyperlipidemia LDL goal <100 07/01/2012    Rosalyn Gess, OTR/L,CLT 02/03/2022, 9:35 AM  [facility] [address], [place], [address] Phone: [phone]   Fax:  [phone]  Name: Marialuiza Car MRN: [id number] Date of Birth: [birth year]

## 2022-02-24 ENCOUNTER — Ambulatory Visit: Payer: Medicare Other | Attending: Oncology | Admitting: Occupational Therapy

## 2022-02-24 DIAGNOSIS — I972 Postmastectomy lymphedema syndrome: Secondary | ICD-10-CM | POA: Diagnosis not present

## 2022-02-24 NOTE — Therapy (Signed)
[facility] [address], [place], [address] Phone: [phone]   Fax:  [phone]  Occupational Therapy Treatment  Patient Details  Name: Maria Patterson MRN: [id number] Date of Birth: August 20, 1940 No data recorded  Encounter Date: 02/24/2022   OT End of Session - 02/24/22 0943     Visit Number 18    Number of Visits 19    Date for OT Re-Evaluation 03/10/22    OT Start Time 0900    OT Stop Time 0935    OT Time Calculation (min) 35 min    Activity Tolerance Patient tolerated treatment well    Behavior During Therapy [facility] for tasks assessed/performed             Past Medical History:  Diagnosis Date   Anemia    Breast cancer ([place]) 2018   Left   Breast mass, left 09/22/2016   RECOMMENDATION: Ultrasound-guided core biopsies of masses at left breast 2 o'clock 2 cm from nipple, left breast 2 o'clock 5 cm from nipple, abnormal left axillary lymph nodes.   COPD (chronic obstructive pulmonary disease) (HCC)    Depression    GERD (gastroesophageal reflux disease)    History of chickenpox    Hx of adenomatous colonic polyps    Hyperglycemia    Hyperlipidemia    Obesity    Personal history of chemotherapy 2018   Left breast   Personal history of radiation therapy 2018   Postmenopausal    Shingles    right eye     Past Surgical History:  Procedure Laterality Date   ABDOMINAL HYSTERECTOMY     AXILLARY SENTINEL NODE BIOPSY Left 04/14/2017   Procedure: AXILLARY SENTINEL NODE BIOPSY;  Surgeon: Olean Ree, MD;  Location: [facility] ORS;  Service: General;  Laterality: Left;   BREAST BIOPSY Left 09/30/2016   US biopsy of 3 areas + chemo   BREAST EXCISIONAL BIOPSY Left 04/14/2017   Left breast invasive cancer left mastectomy   COLONOSCOPY WITH PROPOFOL N/A 06/17/2020   Procedure: COLONOSCOPY WITH PROPOFOL;  Surgeon: Toledo, Benay Pike, MD;  Location: [facility] ENDOSCOPY;  Service: Gastroenterology;  Laterality: N/A;    ESOPHAGOGASTRODUODENOSCOPY (EGD) WITH PROPOFOL N/A 12/24/2014   Procedure: ESOPHAGOGASTRODUODENOSCOPY (EGD) WITH PROPOFOL;  Surgeon: Manya Silvas, MD;  Location: [facility] ENDOSCOPY;  Service: Endoscopy;  Laterality: N/A;   MASTECTOMY Left 04/14/2017   Left breast invasive cancer   PORTACATH PLACEMENT Right 10/27/2016   Procedure: INSERTION PORT-A-CATH;  Surgeon: Nestor Lewandowsky, MD;  Location: [facility] ORS;  Service: General;  Laterality: Right;   TONSILECTOMY, ADENOIDECTOMY, BILATERAL MYRINGOTOMY AND TUBES     TONSILLECTOMY     TOTAL MASTECTOMY Left 04/14/2017   Procedure: TOTAL MASTECTOMY;  Surgeon: Olean Ree, MD;  Location: [facility] ORS;  Service: General;  Laterality: Left;    There were no vitals filed for this visit.   Subjective Assessment - 02/24/22 0940     Subjective  I have been doing everything you told me.  I am getting tired of all the stuff.  I did go to church with nothing on my arm    Pertinent History Per chart review:  Pt is s/p total mastectomy 04/14/2017.  Completed neoadjuvant Taxotere, carbo, Herceptin and Perjeta on 02/17/2017.  Completed 1 year of Herceptin on 11/03/2017.  Would not benefit from aromatase inhibitor.Pt cellulitis started 06/03/18 in R forearm - did 3 rounds of antibiotics - appear last one improved symptoms - DVT Doppler was negative then - she was  seen since then 2 x to replace compression.  10/2021 pt reports she has not had any more episodes of cellulitis but feels her arm has continued to increase in size over time.  She reports being compliant with daytime and nighttime compression wear.    Patient Stated Goals I want my arm to get better and not have to wrap.    Currently in Pain? No/denies                 LYMPHEDEMA/ONCOLOGY QUESTIONNAIRE - 02/24/22 0001       Left Upper Extremity Lymphedema   15 cm Proximal to Olecranon Process 34.5 cm    10 cm Proximal to Olecranon Process 35 cm    Olecranon Process 28.5 cm    15 cm Proximal to Ulnar Styloid  Process 31.2 cm    10 cm Proximal to Ulnar Styloid Process 28 cm    Just Proximal to Ulnar Styloid Process 18 cm    Across Hand at [employer] 19 cm                Patient arrived this date coming in with her [employer] soft sleeve. And  old glove - feels like older one fits better   Last 2 visits patient's forearm was increased  First patient reported that she is doing her program with 2 pumps a day nighttime garment and nighttime garment. Upon further assessment patient does admit she wakes up some mornings with her nighttime sleeve off especially after the heat wave of last week. Patient was educated on if that happens that she would need to use her nighttime garment during the day some to decrease her circumference. Reminded patient again the daytime garment only maintains her circumference not decreasing it.     Patient's forearm continues to be increased compared to prior to fitting with daytime compression. Nighttime compression sleeve -OT adjusted straps on forearm previous visit  after wearing 5 minutes patient did show some lymphatic drainage and forearm. Patient to wear her nighttime garment for the rest of this week during the day if at home. In combination with 2 pumps Patient to follow-up in 1 week to reassess if measurements is decreasing Plan to discharge at that time. Patient put on her calendar to contact me in January for reassessment and fitting of new compression daytimes sleeve.                    OT Education - 02/24/22 0943     Education Details Wearing of compression as well as using pump.    Person(s) Educated Patient    Methods Explanation;Demonstration    Comprehension Verbalized understanding;Returned demonstration              OT Short Term Goals - 01/12/22 0848       OT SHORT TERM GOAL #1   Title Pt will be independent with home management program for lymphedema.    Status Achieved               OT Long Term  Goals - 01/12/22 0848       OT LONG TERM GOAL #1   Title Pt will be independent with wear and care of new daytime compression sleeve and glove.    Baseline 6Awaiting new compression garments.Was measured waiting to be fitted.    Period Weeks    Status On-going    Target Date 03/02/22      OT LONG TERM GOAL #2  Title Pt to demonstrate a decrease in LUE measurements at all levels to decrease the risk of cellulitis and prepare the arm for fitting of new daytime sleeve.    Status Achieved                   Plan - 02/24/22 0944     Clinical Impression Statement Pt is a 81 yo old female with history of breast cancer with left mastectomy in 2018 and episodes of cellulitis.  Pt has been seen in the lymphedema clinic on several occasions over the years and was last scheduled to follow up in April 2022 to replace her day time sleeve. Pt returned on 10/30/21 with a flareup of lymphedema after not replacing her daytime compression sleeve. Patient measurements were significantly up compared to 2 years ago as well as compared to the right arm.  Patient took a little bit longer to decongest left upper extremity because she did not want to be bandage.  CLT used her pump and existing garments with a bandage over.  But at the end patient did allow CLT to do bandaging with great success.  Patient was fitted with a new [employer] El[employer] soft sleeve and glove patient arrived today with again increase of circumference from wrist to elbow.  Upon further assessment it appears with a heat wave last week patient wake up in the morning with her nighttime garment off.  Educated patient on if that does occur.  Patient would need to wear her nighttime garment longer during the day in combination with her 2 pumping sessions to decrease her measurements if she is around the house for the day.  She can wear her daytime compression when out and about but that does not decrease her circumference just maintaining it.  Would  recommend for patient to follow-up with me in a week to reassess if she is able to decrease measurements with home program and garments.. Pt would benefit from skilled OT services to to monitor if new compression garments are containing her lymphedema and preventing future flareups or infections.  Patient to put on her calendar to follow-up in January again for replacing her garment.  Possible discharge next session if measurements decrease.    OT Occupational Profile and History Detailed Assessment- Review of Records and additional review of physical, cognitive, psychosocial history related to current functional performance    Occupational performance deficits (Please refer to evaluation for details): ADL's;Leisure;IADL's    Body Structure / Function / Physical Skills ADL;Edema;UE functional use    Rehab Potential Good    Clinical Decision Making Limited treatment options, no task modification necessary    Comorbidities Affecting Occupational Performance: May have comorbidities impacting occupational performance    Modification or Assistance to Complete Evaluation  No modification of tasks or assist necessary to complete eval    OT Frequency 1x / week    OT Duration 4 weeks    OT Treatment/Interventions Self-care/ADL training;Manual lymph drainage;Patient/family education;Compression bandaging;Therapeutic exercise;Manual Therapy    Consulted and Agree with Plan of Care Patient             Patient will benefit from skilled therapeutic intervention in order to improve the following deficits and impairments:   Body Structure / Function / Physical Skills: ADL, Edema, UE functional use       Visit Diagnosis: Postmastectomy lymphedema syndrome    Problem List Patient Active Problem List   Diagnosis Date Noted   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  Cervical arthritis 02/04/2021   History of breast cancer 02/04/2021   Spider vein of lower extremity 12/20/2020   Lymphedema of arm  09/18/2020   Chronic right shoulder pain 09/18/2020   Tubular adenoma 01/18/2020   Osteoporosis 05/12/2019   Anxiety and depression 05/12/2019   Herpes 05/12/2019   Dermatitis 05/12/2019   Colon polyps 03/17/2019   Pre-diabetes 02/19/2019   H/O total mastectomy of left breast 06/03/2018   Obesity (BMI 30.0-34.9) 03/08/2018   Insomnia 09/26/2017   Stage 3b chronic kidney disease ([place]) 08/09/2017   Hypomagnesemia 05/04/2017   Aortic atherosclerosis ([place]) 05/04/2017   Compression fracture of first lumbar vertebra ([place]) 05/04/2017   Degenerative disc disease, lumbar 05/04/2017   Anemia 02/01/2017   Atherosclerosis of native arteries of extremity with intermittent claudication ([place]) 11/09/2016   Allergic rhinitis 11/07/2016   Malignant neoplasm of upper-outer quadrant of left female breast ([place]) 10/08/2016   Postmenopausal 09/18/2016   Ache in joint 03/09/2016   Gastroesophageal reflux disease 07/01/2012   Hyperlipidemia LDL goal <100 07/01/2012    Rosalyn Gess, OTR/L,CLT 02/24/2022, 9:58 AM  [facility] [address] [place], [address] Phone: [phone]   Fax:  [phone]  Name: Maria Patterson MRN: [id number] Date of Birth: May 09, 1941

## 2022-03-02 ENCOUNTER — Ambulatory Visit: Payer: Medicare Other | Admitting: Occupational Therapy

## 2022-03-02 DIAGNOSIS — I972 Postmastectomy lymphedema syndrome: Secondary | ICD-10-CM

## 2022-03-02 NOTE — Therapy (Signed)
[facility] [address], [place], [address] Phone: [phone]   Fax:  [phone]  Occupational Therapy Treatment/discharge  Patient Details  Name: Maria Patterson MRN: [id number] Date of Birth: 11/18/1940 No data recorded  Encounter Date: 03/02/2022   OT End of Session - 03/02/22 0924     Visit Number 19    Number of Visits 19    Date for OT Re-Evaluation 03/02/22    OT Start Time 0900    OT Stop Time 0918    OT Time Calculation (min) 18 min    Activity Tolerance Patient tolerated treatment well    Behavior During Therapy [facility] for tasks assessed/performed             Past Medical History:  Diagnosis Date   Anemia    Breast cancer ([place]) 2018   Left   Breast mass, left 09/22/2016   RECOMMENDATION: Ultrasound-guided core biopsies of masses at left breast 2 o'clock 2 cm from nipple, left breast 2 o'clock 5 cm from nipple, abnormal left axillary lymph nodes.   COPD (chronic obstructive pulmonary disease) (HCC)    Depression    GERD (gastroesophageal reflux disease)    History of chickenpox    Hx of adenomatous colonic polyps    Hyperglycemia    Hyperlipidemia    Obesity    Personal history of chemotherapy 2018   Left breast   Personal history of radiation therapy 2018   Postmenopausal    Shingles    right eye     Past Surgical History:  Procedure Laterality Date   ABDOMINAL HYSTERECTOMY     AXILLARY SENTINEL NODE BIOPSY Left 04/14/2017   Procedure: AXILLARY SENTINEL NODE BIOPSY;  Surgeon: Olean Ree, MD;  Location: [facility] ORS;  Service: General;  Laterality: Left;   BREAST BIOPSY Left 09/30/2016   US biopsy of 3 areas + chemo   BREAST EXCISIONAL BIOPSY Left 04/14/2017   Left breast invasive cancer left mastectomy   COLONOSCOPY WITH PROPOFOL N/A 06/17/2020   Procedure: COLONOSCOPY WITH PROPOFOL;  Surgeon: Toledo, Benay Pike, MD;  Location: [facility] ENDOSCOPY;  Service: Gastroenterology;   Laterality: N/A;   ESOPHAGOGASTRODUODENOSCOPY (EGD) WITH PROPOFOL N/A 12/24/2014   Procedure: ESOPHAGOGASTRODUODENOSCOPY (EGD) WITH PROPOFOL;  Surgeon: Manya Silvas, MD;  Location: [facility] ENDOSCOPY;  Service: Endoscopy;  Laterality: N/A;   MASTECTOMY Left 04/14/2017   Left breast invasive cancer   PORTACATH PLACEMENT Right 10/27/2016   Procedure: INSERTION PORT-A-CATH;  Surgeon: Nestor Lewandowsky, MD;  Location: [facility] ORS;  Service: General;  Laterality: Right;   TONSILECTOMY, ADENOIDECTOMY, BILATERAL MYRINGOTOMY AND TUBES     TONSILLECTOMY     TOTAL MASTECTOMY Left 04/14/2017   Procedure: TOTAL MASTECTOMY;  Surgeon: Olean Ree, MD;  Location: [facility] ORS;  Service: General;  Laterality: Left;    There were no vitals filed for this visit.   Subjective Assessment - 03/02/22 0923     Subjective  I did everything you told me last week to do.  Wore that night sleeve during the day when I was at home.  I think my arm is little down.  I have to wear this for the rest.    Pertinent History Per chart review:  Pt is s/p total mastectomy 04/14/2017.  Completed neoadjuvant Taxotere, carbo, Herceptin and Perjeta on 02/17/2017.  Completed 1 year of Herceptin on 11/03/2017.  Would not benefit from aromatase inhibitor.Pt cellulitis started 06/03/18 in R forearm - did 3 rounds of antibiotics - appear last  one improved symptoms - DVT Doppler was negative then - she was seen since then 2 x to replace compression.  10/2021 pt reports she has not had any more episodes of cellulitis but feels her arm has continued to increase in size over time.  She reports being compliant with daytime and nighttime compression wear.    Patient Stated Goals I want my arm to get better and not have to wrap.    Currently in Pain? No/denies                 LYMPHEDEMA/ONCOLOGY QUESTIONNAIRE - 03/02/22 0001       Left Upper Extremity Lymphedema   15 cm Proximal to Olecranon Process 33.5 cm    10 cm Proximal to Olecranon Process 35 cm     Olecranon Process 28 cm    15 cm Proximal to Ulnar Styloid Process 30 cm    10 cm Proximal to Ulnar Styloid Process 27 cm    Just Proximal to Ulnar Styloid Process 18 cm    Across Hand at [employer] 19.5 cm               Patient arrived this date coming in with her [employer] El[employer]x soft sleeve. And glove  Last 2 visits patient's forearm was increased  Last week patient reported that she with the hot weather counter self taking off her night sleep at night sometimes. Since last week patient wore nighttime compression sleeve during the day to to decrease circumference together with the pump. Arrived this date with forearm and elbow measurements decreased by half a centimeter to 1 cm. Reviewed with patient again if that happens that she would need to use her nighttime garment during the day some to decrease her circumference.  The nighttime compression sleeve and pump decreases her circumference. Reminded patient again the daytime garment only maintains her circumference not decreasing it.     /Patient reported understanding of home program.   Patient had a question again about wearing this for the rest of her life.  Reviewed with patient again lymphedema and stage II lymphedema.   Need for compression 24/7 with her type of lymphedema.   Patient put on her calendar to contact me in January 2024 for reassessment and fitting of new compression daytimes sleeve to prevent future flareups. Patient in agreement                OT Education - 03/02/22 0924     Education Details Wearing of compression as well as using pump.;  Discharge instructions    Person(s) Educated Patient    Methods Explanation;Demonstration    Comprehension Verbalized understanding;Returned demonstration              OT Short Term Goals - 01/12/22 0848       OT SHORT TERM GOAL #1   Title Pt will be independent with home management program for lymphedema.    Status Achieved                OT Long Term Goals - 03/02/22 0929       OT LONG TERM GOAL #1   Title Pt will be independent with wear and care of new daytime compression sleeve and glove.    Status Achieved      OT LONG TERM GOAL #2   Title Pt to demonstrate a decrease in LUE measurements at all levels to decrease the risk of cellulitis and prepare the arm for fitting of new  daytime sleeve.    Status Achieved                   Plan - 03/02/22 0925     Clinical Impression Statement Pt is a 81 yo old female with history of breast cancer with left mastectomy in 2018 and episodes of cellulitis.  Pt has been seen in the lymphedema clinic on several occasions over the years and was last scheduled to follow up in April 2022 to replace her day time sleeve. Pt returned on 10/30/21 with a flareup of lymphedema after not replacing her daytime compression sleeve. Patient measurements were significantly up compared to 2 years ago as well as compared to the right arm.  Patient took a little bit longer to decongest left upper extremity because she did not want to be bandage.  CLT used her pump and existing garments with a bandage over.  But at the end patient did allow CLT to do bandaging with great success.  Patient was fitted with a new Job[employer] Elvarex soft sleeve and glove, new powersleeve over night time compression , and pump 2 x day - pt was increase last week in elbow and forearm because of taking her night time sleeve off with hot weather at nigh time. Pt used night time compression during day some the last week to decrease her forearm - with good success. Reviewed with pt again her homeprogram daily - and what to do if circumference increase again. Pt to contact me in January 2024 to replace daytime sleeve. Pt in agreement- needed reminder again that her stage 2 lymphedeme requires life time compression garments wearing daytime and night time.    OT Occupational Profile and History Detailed Assessment- Review of Records  and additional review of physical, cognitive, psychosocial history related to current functional performance    Occupational performance deficits (Please refer to evaluation for details): ADL's;Leisure;IADL's    Body Structure / Function / Physical Skills ADL;Edema;UE functional use    Rehab Potential Good    Clinical Decision Making Limited treatment options, no task modification necessary    Comorbidities Affecting Occupational Performance: May have comorbidities impacting occupational performance    Modification or Assistance to Complete Evaluation  No modification of tasks or assist necessary to complete eval    OT Treatment/Interventions Self-care/ADL training;Manual lymph drainage;Patient/family education;Compression bandaging;Therapeutic exercise;Manual Therapy    Consulted and Agree with Plan of Care Patient             Patient will benefit from skilled therapeutic intervention in order to improve the following deficits and impairments:   Body Structure / Function / Physical Skills: ADL, Edema, UE functional use       Visit Diagnosis: Postmastectomy lymphedema syndrome    Problem List Patient Active Problem List   Diagnosis Date Noted   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   Cervical arthritis 02/04/2021   History of breast cancer 02/04/2021   Spider vein of lower extremity 12/20/2020   Lymphedema of arm 09/18/2020   Chronic right shoulder pain 09/18/2020   Tubular adenoma 01/18/2020   Osteoporosis 05/12/2019   Anxiety and depression 05/12/2019   Herpes 05/12/2019   Dermatitis 05/12/2019   Colon polyps 03/17/2019   Pre-diabetes 02/19/2019   H/O total mastectomy of left breast 06/03/2018   Obesity (BMI 30.0-34.9) 03/08/2018   Insomnia 09/26/2017   Stage 3b chronic kidney disease ([place]) 08/09/2017   Hypomagnesemia 05/04/2017   Aortic atherosclerosis ([place]) 05/04/2017   Compression fracture of first lumbar vertebra (  [place]) 05/04/2017   Degenerative disc  disease, lumbar 05/04/2017   Anemia 02/01/2017   Atherosclerosis of native arteries of extremity with intermittent claudication ([place]) 11/09/2016   Allergic rhinitis 11/07/2016   Malignant neoplasm of upper-outer quadrant of left female breast ([place]) 10/08/2016   Postmenopausal 09/18/2016   Ache in joint 03/09/2016   Gastroesophageal reflux disease 07/01/2012   Hyperlipidemia LDL goal <100 07/01/2012    Rosalyn Gess, OTR/L,CLT 03/02/2022, 9:31 AM  [facility] [address] [place], [address] Phone: [phone]   Fax:  [phone]  Name: Maria Patterson MRN: [id number] Date of Birth: 12-24-1940

## 2022-03-25 ENCOUNTER — Encounter: Payer: Self-pay | Admitting: Internal Medicine

## 2022-03-25 ENCOUNTER — Ambulatory Visit (INDEPENDENT_AMBULATORY_CARE_PROVIDER_SITE_OTHER): Payer: Medicare Other | Admitting: Internal Medicine

## 2022-03-25 VITALS — BP 110/70 | HR 58 | Temp 97.9°F | Ht 62.0 in | Wt 171.0 lb

## 2022-03-25 DIAGNOSIS — R5383 Other fatigue: Secondary | ICD-10-CM

## 2022-03-25 DIAGNOSIS — R7303 Prediabetes: Secondary | ICD-10-CM

## 2022-03-25 DIAGNOSIS — E611 Iron deficiency: Secondary | ICD-10-CM | POA: Diagnosis not present

## 2022-03-25 DIAGNOSIS — I10 Essential (primary) hypertension: Secondary | ICD-10-CM | POA: Diagnosis not present

## 2022-03-25 DIAGNOSIS — Z1329 Encounter for screening for other suspected endocrine disorder: Secondary | ICD-10-CM | POA: Diagnosis not present

## 2022-03-25 DIAGNOSIS — N1832 Chronic kidney disease, stage 3b: Secondary | ICD-10-CM

## 2022-03-25 DIAGNOSIS — J309 Allergic rhinitis, unspecified: Secondary | ICD-10-CM | POA: Diagnosis not present

## 2022-03-25 DIAGNOSIS — H5789 Other specified disorders of eye and adnexa: Secondary | ICD-10-CM | POA: Diagnosis not present

## 2022-03-25 DIAGNOSIS — J3089 Other allergic rhinitis: Secondary | ICD-10-CM | POA: Diagnosis not present

## 2022-03-25 DIAGNOSIS — I2510 Atherosclerotic heart disease of native coronary artery without angina pectoris: Secondary | ICD-10-CM

## 2022-03-25 DIAGNOSIS — E785 Hyperlipidemia, unspecified: Secondary | ICD-10-CM

## 2022-03-25 DIAGNOSIS — K219 Gastro-esophageal reflux disease without esophagitis: Secondary | ICD-10-CM

## 2022-03-25 LAB — COMPREHENSIVE METABOLIC PANEL
ALT: 19 U/L (ref 0–35)
AST: 21 U/L (ref 0–37)
Albumin: 3.7 g/dL (ref 3.5–5.2)
Alkaline Phosphatase: 86 U/L (ref 39–117)
BUN: 15 mg/dL (ref 6–23)
CO2: 24 mEq/L (ref 19–32)
Calcium: 9.4 mg/dL (ref 8.4–10.5)
Chloride: 111 mEq/L (ref 96–112)
Creatinine, Ser: 1.19 mg/dL (ref 0.40–1.20)
GFR: 42.52 mL/min — ABNORMAL LOW (ref >60.00)
Glucose, Bld: 74 mg/dL (ref 70–99)
Potassium: 4.1 mEq/L (ref 3.5–5.1)
Sodium: 144 mEq/L (ref 135–145)
Total Bilirubin: 0.4 mg/dL (ref 0.2–1.2)
Total Protein: 6.5 g/dL (ref 6.0–8.3)

## 2022-03-25 LAB — TSH: TSH: 1.31 u[IU]/mL (ref 0.35–5.50)

## 2022-03-25 LAB — HEMOGLOBIN A1C: Hgb A1c MFr Bld: 5.7 % (ref 4.6–6.5)

## 2022-03-25 LAB — IBC + FERRITIN
Ferritin: 131.5 ng/mL (ref 10.0–291.0)
Iron: 84 ug/dL (ref 42–145)
Saturation Ratios: 34.3 % (ref 20.0–50.0)
TIBC: 245 ug/dL — ABNORMAL LOW (ref 250.0–450.0)
Transferrin: 175 mg/dL — ABNORMAL LOW (ref 212.0–360.0)

## 2022-03-25 LAB — LIPID PANEL
Cholesterol: 192 mg/dL (ref 0–200)
HDL: 65.1 mg/dL (ref >39.00)
LDL Cholesterol: 109 mg/dL — ABNORMAL HIGH (ref 0–99)
NonHDL: 126.83
Total CHOL/HDL Ratio: 3
Triglycerides: 88 mg/dL (ref 0.0–149.0)
VLDL: 17.6 mg/dL (ref 0.0–40.0)

## 2022-03-25 MED ORDER — PANTOPRAZOLE SODIUM 40 MG PO TBEC: 40.0000 mg | DELAYED_RELEASE_TABLET | Freq: Every day | ORAL | 3 refills | Status: DC

## 2022-03-25 MED ORDER — LEVOCETIRIZINE DIHYDROCHLORIDE 5 MG PO TABS: 2.5000 mg | ORAL_TABLET | Freq: Every evening | ORAL | 3 refills | Status: DC

## 2022-03-25 MED ORDER — NEBIVOLOL HCL 2.5 MG PO TABS: 2.5000 mg | ORAL_TABLET | Freq: Every day | ORAL | 3 refills | Status: DC

## 2022-03-25 MED ORDER — SALINE SPRAY 0.65 % NA SOLN: 1.0000 | NASAL | 11 refills | Status: DC | PRN

## 2022-03-25 MED ORDER — EZETIMIBE 10 MG PO TABS: 10.0000 mg | ORAL_TABLET | Freq: Every day | ORAL | 3 refills | Status: DC

## 2022-03-25 MED ORDER — ROSUVASTATIN CALCIUM 20 MG PO TABS: 20.0000 mg | ORAL_TABLET | Freq: Every day | ORAL | 3 refills | Status: DC

## 2022-03-25 MED ORDER — FLUTICASONE PROPIONATE 50 MCG/ACT NA SUSP: 2.0000 | Freq: Every day | NASAL | 11 refills | Status: DC | PRN

## 2022-03-25 NOTE — Progress Notes (Signed)
Has appt with Dr Volanda Napoleon already scheduled.

## 2022-03-25 NOTE — Progress Notes (Signed)
Chief Complaint  Patient presents with   Follow-up    3 MONTH F/U PT IS FASTING    F/u  1. Ckd3b f/u Dr. Candiss Norse renal appt 03/2022 HTN controlled bystolic 2.5 mg qd  2. Prediabetes trying to each healthy    Review of Systems  Constitutional:  Negative for weight loss.  HENT:  Negative for hearing loss.   Eyes:  Negative for blurred vision.  Respiratory:  Negative for shortness of breath.   Cardiovascular:  Negative for chest pain.  Gastrointestinal:  Negative for abdominal pain and blood in stool.  Genitourinary:  Negative for dysuria.  Musculoskeletal:  Negative for falls and joint pain.  Skin:  Negative for rash.  Neurological:  Negative for headaches.  Psychiatric/Behavioral:  Negative for depression.    Past Medical History:  Diagnosis Date   Anemia    Breast cancer ([place]) 2018   Left   Breast mass, left 09/22/2016   RECOMMENDATION: Ultrasound-guided core biopsies of masses at left breast 2 o'clock 2 cm from nipple, left breast 2 o'clock 5 cm from nipple, abnormal left axillary lymph nodes.   COPD (chronic obstructive pulmonary disease) (HCC)    Depression    GERD (gastroesophageal reflux disease)    History of chickenpox    Hx of adenomatous colonic polyps    Hyperglycemia    Hyperlipidemia    Obesity    Personal history of chemotherapy 2018   Left breast   Personal history of radiation therapy 2018   Postmenopausal    Shingles    right eye    Past Surgical History:  Procedure Laterality Date   ABDOMINAL HYSTERECTOMY     AXILLARY SENTINEL NODE BIOPSY Left 04/14/2017   Procedure: AXILLARY SENTINEL NODE BIOPSY;  Surgeon: Olean Ree, MD;  Location: [facility] ORS;  Service: General;  Laterality: Left;   BREAST BIOPSY Left 09/30/2016   US biopsy of 3 areas + chemo   BREAST EXCISIONAL BIOPSY Left 04/14/2017   Left breast invasive cancer left mastectomy   COLONOSCOPY WITH PROPOFOL N/A 06/17/2020   Procedure: COLONOSCOPY WITH PROPOFOL;  Surgeon: Toledo, Benay Pike, MD;   Location: [facility] ENDOSCOPY;  Service: Gastroenterology;  Laterality: N/A;   ESOPHAGOGASTRODUODENOSCOPY (EGD) WITH PROPOFOL N/A 12/24/2014   Procedure: ESOPHAGOGASTRODUODENOSCOPY (EGD) WITH PROPOFOL;  Surgeon: Manya Silvas, MD;  Location: [facility]COPY;  Service: Endoscopy;  Laterality: N/A;   MASTECTOMY Left 04/14/2017   Left breast invasive cancer   PORTACATH PLACEMENT Right 10/27/2016   Procedure: INSERTION PORT-A-CATH;  Surgeon: Nestor Lewandowsky, MD;  Location: [facility] ORS;  Service: General;  Laterality: Right;   TONSILECTOMY, ADENOIDECTOMY, BILATERAL MYRINGOTOMY AND TUBES     TONSILLECTOMY     TOTAL MASTECTOMY Left 04/14/2017   Procedure: TOTAL MASTECTOMY;  Surgeon: Olean Ree, MD;  Location: [facility] ORS;  Service: General;  Laterality: Left;   Family History  Problem Relation Age of Onset   Stroke Mother    Stroke Father    Cancer Sister        with mets as of 09/18/20   Cancer Daughter        as of 05/06/21 dx ? cancer type 2   Depression Neg Hx    Breast cancer Neg Hx    Social History   Socioeconomic History   Marital status: Widowed    Spouse name: Not on file   Number of children: 1   Years of education: GED   Highest education level: 12th grade  Occupational History   Occupation: Retired  Tobacco Use  Smoking status: Never   Smokeless tobacco: Never   Tobacco comments:    smoking cessation materials not required  Vaping Use   Vaping Use: Never used  Substance and Sexual Activity   Alcohol use: No    Alcohol/week: 0.0 standard drinks of alcohol   Drug use: No   Sexual activity: Not Currently  Other Topics Concern   Not on file  Social History Narrative   Lives at home    1 kid [name] (daughter) [phone]   Social Determinants of Health   Financial Resource Strain: Medium Risk (01/27/2018)   Overall Financial Resource Strain (CARDIA)    Difficulty of Paying Living Expenses: Somewhat hard  Food Insecurity: Food Insecurity Present (01/27/2018)   Hunger  Vital Sign    Worried About Running Out of Food in the Last Year: Sometimes true    Ran Out of Food in the Last Year: Sometimes true  Transportation Needs: No Transportation Needs (01/27/2018)   PRAPARE - Hydrologist (Medical): No    Lack of Transportation (Non-Medical): No  Physical Activity: Inactive (01/27/2018)   Exercise Vital Sign    Days of Exercise per Week: 0 days    Minutes of Exercise per Session: 0 min  Stress: No Stress Concern Present (01/27/2018)   [place]    Feeling of Stress : Not at all  Social Connections: Somewhat Isolated (03/08/2018)   Social Connection and Isolation Panel [NHANES]    Frequency of Communication with Friends and Family: More than three times a week    Frequency of Social Gatherings with Friends and Family: More than three times a week    Attends Religious Services: More than 4 times per year    Active Member of [employer] or Organizations: No    Attends Archivist Meetings: Never    Marital Status: Widowed  Intimate Partner Violence: Not At Risk (01/27/2018)   Humiliation, Afraid, Rape, and Kick questionnaire    Fear of Current or Ex-Partner: No    Emotionally Abused: No    Physically Abused: No    Sexually Abused: No   Current Meds  Medication Sig   acetaminophen (TYLENOL) 325 MG tablet Take 2 tablets (650 mg total) by mouth every 6 (six) hours as needed.   aspirin 81 MG tablet Take 81 mg by mouth. Takes every 3-4 days.   carbamide peroxide (DEBROX) 6.5 % OTIC solution Place 5 drops into the right ear 2 (two) times daily. X 4-7 days as needed right ear wax   Cholecalciferol (VITAMIN D) 2000 units tablet Take 2,000 Units by mouth 3 (three) times a week.    ferrous sulfate 325 (65 FE) MG EC tablet Take 325 mg by mouth 3 (three) times a week.    hydrocortisone 2.5 % lotion Apply topically 2 (two) times daily. Prn   Multiple Vitamin (MULTIVITAMIN  WITH MINERALS) TABS tablet Take 1 tablet by mouth 3 (three) times a week.    neomycin-polymyxin-hydrocortisone (CORTISPORIN) OTIC solution Place 4 drops into the right ear 3 (three) times daily. Right ear x 4-7 days after debrox   Polyethyl Glycol-Propyl Glycol (SYSTANE OP) Place 1 drop into both eyes daily as needed (dry eyes).    [DISCONTINUED] ezetimibe (ZETIA) 10 MG tablet Take 1 tablet (10 mg total) by mouth daily.   [DISCONTINUED] fluticasone (FLONASE) 50 MCG/ACT nasal spray Place 2 sprays into both nostrils daily as needed for allergies or rhinitis.   [  DISCONTINUED] levocetirizine (XYZAL) 5 MG tablet Take 0.5 tablets (2.5 mg total) by mouth every evening.   [DISCONTINUED] nebivolol (BYSTOLIC) 2.5 MG tablet Take 1 tablet (2.5 mg total) by mouth daily.   [DISCONTINUED] pantoprazole (PROTONIX) 40 MG tablet Take 1 tablet (40 mg total) by mouth daily. 30 minutes before food   [DISCONTINUED] rosuvastatin (CRESTOR) 20 MG tablet Take 1 tablet (20 mg total) by mouth daily. D/c lipitor 40   [DISCONTINUED] sodium chloride (OCEAN) 0.65 % SOLN nasal spray Place 1 spray into both nostrils as needed for congestion.   Allergies  Allergen Reactions   Latex Itching   Penicillins Itching and Rash    Has patient had a PCN reaction causing immediate rash, facial/tongue/throat swelling, SOB or lightheadedness with hypotension: Yes Has patient had a PCN reaction causing severe rash involving mucus membranes or skin necrosis: No Has patient had a PCN reaction that required hospitalization No Has patient had a PCN reaction occurring within the last 10 years: No If all of the above answers are "NO", then may proceed with Cephalosporin use.    No results found for this or any previous visit (from the past 2160 hour(s)). Objective  Body mass index is 31.28 kg/m. Wt Readings from Last 3 Encounters:  03/25/22 171 lb (77.6 kg)  12/23/21 176 lb 6.4 oz (80 kg)  10/10/21 180 lb 12.8 oz (82 kg)   Temp Readings  from Last 3 Encounters:  03/25/22 97.9 F (36.6 C) (Oral)  12/23/21 98.2 F (36.8 C) (Oral)  10/10/21 98.3 F (36.8 C) (Oral)   BP Readings from Last 3 Encounters:  03/25/22 110/70  12/23/21 130/70  10/10/21 (!) 159/73   Pulse Readings from Last 3 Encounters:  03/25/22 (!) 58  12/23/21 68  10/10/21 (!) 51    Physical Exam Vitals and nursing note reviewed.  Constitutional:      Appearance: Normal appearance. She is well-developed and well-groomed.  HENT:     Head: Normocephalic and atraumatic.  Eyes:     Conjunctiva/sclera: Conjunctivae normal.     Pupils: Pupils are equal, round, and reactive to light.  Cardiovascular:     Rate and Rhythm: Normal rate and regular rhythm.     Heart sounds: Normal heart sounds. No murmur heard. Pulmonary:     Effort: Pulmonary effort is normal.     Breath sounds: Normal breath sounds.  Abdominal:     General: Abdomen is flat. Bowel sounds are normal.     Tenderness: There is no abdominal tenderness.  Musculoskeletal:        General: No tenderness.  Skin:    General: Skin is warm and dry.  Neurological:     General: No focal deficit present.     Mental Status: She is alert and oriented to person, place, and time. Mental status is at baseline.     Cranial Nerves: Cranial nerves 2-12 are intact.     Motor: Motor function is intact.     Coordination: Coordination is intact.     Gait: Gait is intact.  Psychiatric:        Attention and Perception: Attention and perception normal.        Mood and Affect: Mood and affect normal.        Speech: Speech normal.        Behavior: Behavior normal. Behavior is cooperative.        Thought Content: Thought content normal.        Cognition and Memory: Cognition and memory normal.  Judgment: Judgment normal.     Assessment  Plan  Red eye left inner eye ? [facility] vs other- Plan: Ambulatory referral to Ophthalmology AE  Non-seasonal allergic rhinitis, unspecified trigger - Plan: sodium  chloride (OCEAN) 0.65 % SOLN nasal spray, fluticasone (FLONASE) 50 MCG/ACT nasal spray, levocetirizine (XYZAL) 5 MG tablet  Hyperlipidemia, unspecified hyperlipidemia type - Plan: ezetimibe (ZETIA) 10 MG tablet, rosuvastatin (CRESTOR) 20 MG tablet, Lipid panel, Comprehensive metabolic panel  Hypertension, controlled - Plan: nebivolol (BYSTOLIC) 2.5 MG tablet, Lipid panel  Coronary artery disease involving native coronary artery of native heart without angina pectoris - Plan: nebivolol (BYSTOLIC) 2.5 MG tablet, Lipid panel, Comprehensive metabolic panel  Allergic rhinitis, unspecified seasonality, unspecified trigger - Plan: levocetirizine (XYZAL) 5 MG tablet  Gastroesophageal reflux disease - Plan: pantoprazole (PROTONIX) 40 MG tablet  Stage 3b chronic kidney disease ([place]) Appt 03/2022   Iron deficiency - Plan: IBC + Ferritin On oral iron   Prediabetes - Plan: Hemoglobin A1c   HM Flu shot  due prevnar and pna 23 utd Tdap 02/05/21 shingrix disc in future disc today she thinks she has had -ROI sent [facility] covid vx had 4/4   S/p left mastectomy 09/25/08 right mammo negative 09/26/18 reordered 2021  sch 09/30/20 c/o bone pain ? If should do bone scan/cancer markers with h/o  Chronic lymphedema left upper arm  F/u due [place] in 10/03/2021    Out of age window pap   Colonoscopy had years ago Dr. Tiffany Kocher -02/04/11 sessile tubular adenoma/IH; EGD duodenal polyp 12/24/14 neg path, hiatal hernia Referred [facility] had 06/17/20 tubular adenoma f/u in 5 years    DEXA +osteoporosis try to get prolia approved had 09/2019 2nd dose in 03/2020 due   Started prolia pt thinks causing rash, joint pain and muscle aches    Dr. Candiss Norse f/u 03/2022     Prior PCP Dr. Romana Juniper   Rec healthy diet and exercise   Provider: Dr. Olivia Mackie McLean-Scocuzza-Internal Medicine

## 2022-03-25 NOTE — Patient Instructions (Addendum)
Dr. Nicki Reaper   Rec you get the flu shot  You need 65 and up flu shot   [place] [facility]   [facility] 4.7 1,051 Google reviews Ophthalmology clinic in [place], [place] Address: [address], [place], [place] [address] Hours:  Open ? Closes 12?PM ? Reopens 1?PM Phone: [phone]  of Treatment - Upcoming Encounters Upcoming Encounters Date Type Specialty Care Team Description  11/13/2021 Orders Only Nephrology Murlean Iba, MD   [place]   [place], [place] [address]   [phone] (Work)   [phone] (Fax)   Stage 3b chronic kidney disease ([place]);  Multiple renal cysts  04/09/2022 Office Visit Nephrology Murlean Iba, MD   [place]   [place], [place] [address]   [phone] (Work)   [phone] (Fax)

## 2022-03-31 DIAGNOSIS — H1132 Conjunctival hemorrhage, left eye: Secondary | ICD-10-CM | POA: Diagnosis not present

## 2022-04-09 DIAGNOSIS — I129 Hypertensive chronic kidney disease with stage 1 through stage 4 chronic kidney disease, or unspecified chronic kidney disease: Secondary | ICD-10-CM | POA: Diagnosis not present

## 2022-04-09 DIAGNOSIS — N2581 Secondary hyperparathyroidism of renal origin: Secondary | ICD-10-CM | POA: Diagnosis not present

## 2022-04-09 DIAGNOSIS — I10 Essential (primary) hypertension: Secondary | ICD-10-CM | POA: Diagnosis not present

## 2022-04-09 DIAGNOSIS — N281 Cyst of kidney, acquired: Secondary | ICD-10-CM | POA: Diagnosis not present

## 2022-04-09 DIAGNOSIS — N1832 Chronic kidney disease, stage 3b: Secondary | ICD-10-CM | POA: Diagnosis not present

## 2022-04-10 ENCOUNTER — Ambulatory Visit (INDEPENDENT_AMBULATORY_CARE_PROVIDER_SITE_OTHER): Payer: Medicare Other | Admitting: Internal Medicine

## 2022-04-10 ENCOUNTER — Ambulatory Visit
Admission: RE | Admit: 2022-04-10 | Discharge: 2022-04-10 | Disposition: A | Discharge status: Home / Self Care | Payer: Medicare Other | Source: Ambulatory Visit | Attending: Internal Medicine | Admitting: Internal Medicine

## 2022-04-10 VITALS — Temp 98.1°F | Ht 62.0 in | Wt 177.4 lb

## 2022-04-10 DIAGNOSIS — R42 Dizziness and giddiness: Secondary | ICD-10-CM | POA: Insufficient documentation

## 2022-04-10 DIAGNOSIS — Z20822 Contact with and (suspected) exposure to covid-19: Secondary | ICD-10-CM

## 2022-04-10 DIAGNOSIS — R55 Syncope and collapse: Secondary | ICD-10-CM | POA: Diagnosis not present

## 2022-04-10 DIAGNOSIS — R7989 Other specified abnormal findings of blood chemistry: Secondary | ICD-10-CM

## 2022-04-10 DIAGNOSIS — R519 Headache, unspecified: Secondary | ICD-10-CM

## 2022-04-10 DIAGNOSIS — R079 Chest pain, unspecified: Secondary | ICD-10-CM | POA: Diagnosis not present

## 2022-04-10 LAB — CBC WITH DIFFERENTIAL/PLATELET
Absolute Monocytes: 280 cells/uL (ref 200–950)
Basophils Absolute: 40 cells/uL (ref 0–200)
Basophils Relative: 0.8 %
Eosinophils Absolute: 110 cells/uL (ref 15–500)
Eosinophils Relative: 2.2 %
HCT: 36.5 % (ref 35.0–45.0)
Hemoglobin: 12.1 g/dL (ref 11.7–15.5)
Lymphs Abs: 1870 cells/uL (ref 850–3900)
MCH: 30.6 pg (ref 27.0–33.0)
MCHC: 33.2 g/dL (ref 32.0–36.0)
MCV: 92.2 fL (ref 80.0–100.0)
MPV: 11.2 fL (ref 7.5–12.5)
Monocytes Relative: 5.6 %
Neutro Abs: 2700 cells/uL (ref 1500–7800)
Neutrophils Relative %: 54 %
Platelets: 190 10*3/uL (ref 140–400)
RBC: 3.96 10*6/uL (ref 3.80–5.10)
RDW: 13.8 % (ref 11.0–15.0)
Total Lymphocyte: 37.4 %
WBC: 5 10*3/uL (ref 3.8–10.8)

## 2022-04-10 LAB — COMPREHENSIVE METABOLIC PANEL
AG Ratio: 1.4 (calc) (ref 1.0–2.5)
ALT: 23 U/L (ref 6–29)
AST: 26 U/L (ref 10–35)
Albumin: 3.9 g/dL (ref 3.6–5.1)
Alkaline phosphatase (APISO): 78 U/L (ref 37–153)
BUN/Creatinine Ratio: 13 (calc) (ref 6–22)
BUN: 16 mg/dL (ref 7–25)
CO2: 22 mmol/L (ref 20–32)
Calcium: 9.3 mg/dL (ref 8.6–10.4)
Chloride: 110 mmol/L (ref 98–110)
Creat: 1.26 mg/dL — ABNORMAL HIGH (ref 0.60–0.95)
Globulin: 2.7 g/dL (calc) (ref 1.9–3.7)
Glucose, Bld: 112 mg/dL — ABNORMAL HIGH (ref 65–99)
Potassium: 4.5 mmol/L (ref 3.5–5.3)
Sodium: 143 mmol/L (ref 135–146)
Total Bilirubin: 0.4 mg/dL (ref 0.2–1.2)
Total Protein: 6.6 g/dL (ref 6.1–8.1)

## 2022-04-10 LAB — GLUCOSE, POCT (MANUAL RESULT ENTRY): POC Glucose: 85 mg/dl (ref 70–99)

## 2022-04-10 LAB — TROPONIN I: Troponin I: 4 ng/L (ref <=47)

## 2022-04-10 LAB — D-DIMER, QUANTITATIVE: D-Dimer, Quant: 0.66 mcg/mL FEU — ABNORMAL HIGH (ref <0.50)

## 2022-04-10 NOTE — Addendum Note (Signed)
Addended by: Orland Mustard on: 04/10/2022 06:00 PM   Modules accepted: Orders

## 2022-04-10 NOTE — Progress Notes (Addendum)
Chief Complaint  Patient presents with   Dizziness   F/u  1. She was out shopping [employer] and developed h/a and dizziness/lightheadedness and not feeling right she had not eaten  glucose 85  BP orthostatics checked  Lying 129/80 HR 62, sitting 177/91 hr 62, standing 168/79 hr 76      Review of Systems  Constitutional:  Negative for weight loss.  HENT:  Negative for hearing loss.   Eyes:  Negative for blurred vision.  Respiratory:  Negative for shortness of breath.   Cardiovascular:  Negative for chest pain.  Gastrointestinal:  Negative for abdominal pain and blood in stool.  Genitourinary:  Negative for dysuria.  Musculoskeletal:  Negative for falls and joint pain.  Skin:  Negative for rash.  Neurological:  Positive for dizziness and headaches.  Psychiatric/Behavioral:  Negative for depression.    Past Medical History:  Diagnosis Date   Anemia    Breast cancer ([place]) 2018   Left   Breast mass, left 09/22/2016   RECOMMENDATION: Ultrasound-guided core biopsies of masses at left breast 2 o'clock 2 cm from nipple, left breast 2 o'clock 5 cm from nipple, abnormal left axillary lymph nodes.   COPD (chronic obstructive pulmonary disease) (HCC)    Depression    GERD (gastroesophageal reflux disease)    History of chickenpox    Hx of adenomatous colonic polyps    Hyperglycemia    Hyperlipidemia    Obesity    Personal history of chemotherapy 2018   Left breast   Personal history of radiation therapy 2018   Postmenopausal    Shingles    right eye    Past Surgical History:  Procedure Laterality Date   ABDOMINAL HYSTERECTOMY     AXILLARY SENTINEL NODE BIOPSY Left 04/14/2017   Procedure: AXILLARY SENTINEL NODE BIOPSY;  Surgeon: Olean Ree, MD;  Location: [facility] ORS;  Service: General;  Laterality: Left;   BREAST BIOPSY Left 09/30/2016   US biopsy of 3 areas + chemo   BREAST EXCISIONAL BIOPSY Left 04/14/2017   Left breast invasive cancer left mastectomy   COLONOSCOPY WITH  PROPOFOL N/A 06/17/2020   Procedure: COLONOSCOPY WITH PROPOFOL;  Surgeon: Toledo, Benay Pike, MD;  Location: [facility] ENDOSCOPY;  Service: Gastroenterology;  Laterality: N/A;   ESOPHAGOGASTRODUODENOSCOPY (EGD) WITH PROPOFOL N/A 12/24/2014   Procedure: ESOPHAGOGASTRODUODENOSCOPY (EGD) WITH PROPOFOL;  Surgeon: Manya Silvas, MD;  Location: [facility] ENDOSCOPY;  Service: Endoscopy;  Laterality: N/A;   MASTECTOMY Left 04/14/2017   Left breast invasive cancer   PORTACATH PLACEMENT Right 10/27/2016   Procedure: INSERTION PORT-A-CATH;  Surgeon: Nestor Lewandowsky, MD;  Location: [facility] ORS;  Service: General;  Laterality: Right;   TONSILECTOMY, ADENOIDECTOMY, BILATERAL MYRINGOTOMY AND TUBES     TONSILLECTOMY     TOTAL MASTECTOMY Left 04/14/2017   Procedure: TOTAL MASTECTOMY;  Surgeon: Olean Ree, MD;  Location: [facility] ORS;  Service: General;  Laterality: Left;   Family History  Problem Relation Age of Onset   Stroke Mother    Stroke Father    Cancer Sister        with mets as of 09/18/20   Cancer Daughter        as of 05/06/21 dx ? cancer type 51   Depression Neg Hx    Breast cancer Neg Hx    Social History   Socioeconomic History   Marital status: Widowed    Spouse name: Not on file   Number of children: 1   Years of education: GED   Highest education level:  12th grade  Occupational History   Occupation: Retired  Tobacco Use   Smoking status: Never   Smokeless tobacco: Never   Tobacco comments:    smoking cessation materials not required  Vaping Use   Vaping Use: Never used  Substance and Sexual Activity   Alcohol use: No    Alcohol/week: 0.0 standard drinks of alcohol   Drug use: No   Sexual activity: Not Currently  Other Topics Concern   Not on file  Social History Narrative   Lives at home    1 kid [name] (daughter) [phone]   Social Determinants of Health   Financial Resource Strain: Medium Risk (01/27/2018)   Overall Financial Resource Strain (CARDIA)    Difficulty of  Paying Living Expenses: Somewhat hard  Food Insecurity: Food Insecurity Present (01/27/2018)   Hunger Vital Sign    Worried About Running Out of Food in the Last Year: Sometimes true    Ran Out of Food in the Last Year: Sometimes true  Transportation Needs: No Transportation Needs (01/27/2018)   PRAPARE - Hydrologist (Medical): No    Lack of Transportation (Non-Medical): No  Physical Activity: Inactive (01/27/2018)   Exercise Vital Sign    Days of Exercise per Week: 0 days    Minutes of Exercise per Session: 0 min  Stress: No Stress Concern Present (01/27/2018)   [place]    Feeling of Stress : Not at all  Social Connections: Somewhat Isolated (03/08/2018)   Social Connection and Isolation Panel [NHANES]    Frequency of Communication with Friends and Family: More than three times a week    Frequency of Social Gatherings with Friends and Family: More than three times a week    Attends Religious Services: More than 4 times per year    Active Member of [employer] or Organizations: No    Attends Archivist Meetings: Never    Marital Status: Widowed  Intimate Partner Violence: Not At Risk (01/27/2018)   Humiliation, Afraid, Rape, and Kick questionnaire    Fear of Current or Ex-Partner: No    Emotionally Abused: No    Physically Abused: No    Sexually Abused: No   No outpatient medications have been marked as taking for the 04/10/22 encounter (Office Visit) with McLean-Scocuzza, Nino Glow, MD.   Allergies  Allergen Reactions   Latex Itching   Penicillins Itching and Rash    Has patient had a PCN reaction causing immediate rash, facial/tongue/throat swelling, SOB or lightheadedness with hypotension: Yes Has patient had a PCN reaction causing severe rash involving mucus membranes or skin necrosis: No Has patient had a PCN reaction that required hospitalization No Has patient had a PCN reaction  occurring within the last 10 years: No If all of the above answers are "NO", then may proceed with Cephalosporin use.    Recent Results (from the past 2160 hour(s))  Lipid panel     Status: Abnormal   Collection Time: 03/25/22  9:55 AM  Result Value Ref Range   Cholesterol 192 0 - 200 mg/dL    Comment: ATP III Classification       Desirable:  < 200 mg/dL               Borderline High:  200 - 239 mg/dL          High:  > = 240 mg/dL   Triglycerides 88.0 0.0 - 149.0 mg/dL  Comment: Normal:  <150 mg/dLBorderline High:  150 - 199 mg/dL   HDL 65.10 >39.00 mg/dL   VLDL 17.6 0.0 - 40.0 mg/dL   LDL Cholesterol 109 (H) 0 - 99 mg/dL   Total CHOL/HDL Ratio 3     Comment:                Men          Women1/2 Average Risk     3.4          3.3Average Risk          5.0          4.42X Average Risk          9.6          7.13X Average Risk          15.0          11.0                       NonHDL 126.83     Comment: NOTE:  Non-HDL goal should be 30 mg/dL higher than patient's LDL goal (i.e. LDL goal of < 70 mg/dL, would have non-HDL goal of < 100 mg/dL)  Comprehensive metabolic panel     Status: Abnormal   Collection Time: 03/25/22  9:55 AM  Result Value Ref Range   Sodium 144 135 - 145 mEq/L   Potassium 4.1 3.5 - 5.1 mEq/L   Chloride 111 96 - 112 mEq/L   CO2 24 19 - 32 mEq/L   Glucose, Bld 74 70 - 99 mg/dL   BUN 15 6 - 23 mg/dL   Creatinine, Ser 1.19 0.40 - 1.20 mg/dL   Total Bilirubin 0.4 0.2 - 1.2 mg/dL   Alkaline Phosphatase 86 39 - 117 U/L   AST 21 0 - 37 U/L   ALT 19 0 - 35 U/L   Total Protein 6.5 6.0 - 8.3 g/dL   Albumin 3.7 3.5 - 5.2 g/dL   GFR 42.52 (L) >60.00 mL/min    Comment: Calculated using the CKD-EPI Creatinine Equation (2021)   Calcium 9.4 8.4 - 10.5 mg/dL  TSH     Status: None   Collection Time: 03/25/22  9:55 AM  Result Value Ref Range   TSH 1.31 0.35 - 5.50 uIU/mL  Hemoglobin A1c     Status: None   Collection Time: 03/25/22  9:55 AM  Result Value Ref Range   Hgb A1c MFr  Bld 5.7 4.6 - 6.5 %    Comment: Glycemic Control Guidelines for People with Diabetes:Non Diabetic:  <6%Goal of Therapy: <7%Additional Action Suggested:  >8%   IBC + Ferritin     Status: Abnormal   Collection Time: 03/25/22  9:55 AM  Result Value Ref Range   Iron 84 42 - 145 ug/dL   Transferrin 175.0 (L) 212.0 - 360.0 mg/dL   Saturation Ratios 34.3 20.0 - 50.0 %   Ferritin 131.5 10.0 - 291.0 ng/mL   TIBC 245.0 (L) 250.0 - 450.0 mcg/dL  POCT Glucose (CBG)     Status: Normal   Collection Time: 04/10/22  2:33 PM  Result Value Ref Range   POC Glucose 85 70 - 99 mg/dl   Objective  Body mass index is 32.45 kg/m. Wt Readings from Last 3 Encounters:  04/10/22 177 lb 6.4 oz (80.5 kg)  03/25/22 171 lb (77.6 kg)  12/23/21 176 lb 6.4 oz (80 kg)   Temp Readings from Last 3 Encounters:  04/10/22  98.1 F (36.7 C) (Oral)  03/25/22 97.9 F (36.6 C) (Oral)  12/23/21 98.2 F (36.8 C) (Oral)   BP Readings from Last 3 Encounters:  03/25/22 110/70  12/23/21 130/70  10/10/21 (!) 159/73   Pulse Readings from Last 3 Encounters:  03/25/22 (!) 58  12/23/21 68  10/10/21 (!) 51    Physical Exam Vitals and nursing note reviewed.  Constitutional:      Appearance: Normal appearance. She is well-developed and well-groomed.  HENT:     Head: Normocephalic and atraumatic.  Eyes:     Conjunctiva/sclera: Conjunctivae normal.     Pupils: Pupils are equal, round, and reactive to light.  Cardiovascular:     Rate and Rhythm: Normal rate and regular rhythm.     Heart sounds: Normal heart sounds. No murmur heard. Pulmonary:     Effort: Pulmonary effort is normal.     Breath sounds: Normal breath sounds.  Abdominal:     General: Abdomen is flat. Bowel sounds are normal.     Tenderness: There is no abdominal tenderness.  Musculoskeletal:        General: No tenderness.  Skin:    General: Skin is warm and dry.  Neurological:     General: No focal deficit present.     Mental Status: She is alert and  oriented to person, place, and time. Mental status is at baseline.     Cranial Nerves: Cranial nerves 2-12 are intact.     Motor: Motor function is intact.     Coordination: Coordination is intact.     Gait: Gait is intact.  Psychiatric:        Attention and Perception: Attention and perception normal.        Mood and Affect: Mood and affect normal.        Speech: Speech normal.        Behavior: Behavior normal. Behavior is cooperative.        Thought Content: Thought content normal.        Cognition and Memory: Cognition and memory normal.        Judgment: Judgment normal.     Assessment  Plan  Dizziness/lightheadedness- Plan: Troponin I, Comprehensive metabolic panel, CBC with Differential/Platelet, D-Dimer, Quantitative, CT HEAD WO CONTRAST (5MM),  R/o covid pending D dimer elevated  Will order CT PE further w/u   Postural dizziness with presyncope - Plan: SEE ABOVE  Acute intractable headache, unspecified headache type - Plan: CT HEAD WO CONTRAST (5MM)   SENDING PT TO HOSPITAL ASAP FOR HEAD ct AND HOLD PT UNTIL RESULTS READ  Provider: Dr. Olivia Mackie McLean-Scocuzza-Internal Medicine

## 2022-04-13 ENCOUNTER — Ambulatory Visit
Admission: EM | Admit: 2022-04-13 | Discharge: 2022-04-13 | Disposition: A | Discharge status: Home / Self Care | Payer: Medicare Other

## 2022-04-13 ENCOUNTER — Telehealth: Payer: Self-pay | Admitting: Internal Medicine

## 2022-04-13 ENCOUNTER — Telehealth: Payer: Self-pay

## 2022-04-13 DIAGNOSIS — H9201 Otalgia, right ear: Secondary | ICD-10-CM | POA: Diagnosis not present

## 2022-04-13 NOTE — Telephone Encounter (Signed)
Pt at [facility]/ED today right ear pain/lightheadedness  If this continues rec she f/u with her ENT [facility] call for appt should be established   And   Call [facility]ology to consider heart monitor/echo for lightheadedness

## 2022-04-13 NOTE — Telephone Encounter (Signed)
Patient returned referrals phone call, note read and phone numbers given.

## 2022-04-13 NOTE — Telephone Encounter (Signed)
Spoke to Patient about her questions but she stated she came over here and got her questions answered.

## 2022-04-13 NOTE — Telephone Encounter (Signed)
Patient returned office phone call , note read . Patient is requesting a phone call back from a nurse, she has questions.

## 2022-04-13 NOTE — ED Triage Notes (Signed)
Pt. Is c/o right ear pain and lightheadedness that started about a month ago. Pt. Has recently had a CT scan and reports no abnormal findings.

## 2022-04-13 NOTE — ED Provider Notes (Addendum)
Roderic Palau    CSN: [id number] Arrival date & time: 04/13/22  1209      History   Chief Complaint Chief Complaint  Patient presents with   Otalgia   Dizziness    HPI Maria Patterson is a 81 y.o. female.    Otalgia Dizziness   Presents to UC with complaint of right ear pain that she thinks is an "inner ear infection".  Also reports lightheadedness that started about 1 month ago.  Except for the ear pain which is a new symptom, patient has been evaluated by her primary care provider with imaging ordered.  Most recently seen on 9/22 (3 days ago) and CT head, CT chest, CT soft tissue neck ordered.  Patient completed CT head which showed no acute intercranial abnormality.  Past Medical History:  Diagnosis Date   Anemia    Breast cancer ([place]) 2018   Left   Breast mass, left 09/22/2016   RECOMMENDATION: Ultrasound-guided core biopsies of masses at left breast 2 o'clock 2 cm from nipple, left breast 2 o'clock 5 cm from nipple, abnormal left axillary lymph nodes.   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   Cervical arthritis 02/04/2021   History of breast cancer 02/04/2021   Spider vein of lower extremity 12/20/2020   Lymphedema of arm 09/18/2020   Chronic right shoulder pain 09/18/2020   Tubular adenoma 01/18/2020   Osteoporosis 05/12/2019   Anxiety and depression 05/12/2019   Herpes 05/12/2019   Dermatitis 05/12/2019   Colon polyps 03/17/2019   Pre-diabetes 02/19/2019   H/O total mastectomy of left breast  06/03/2018   Obesity (BMI 30.0-34.9) 03/08/2018   Insomnia 09/26/2017   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   Hypomagnesemia 05/04/2017   Aortic atherosclerosis ([place]) 05/04/2017   Compression fracture of first lumbar vertebra ([place]) 05/04/2017   Degenerative disc disease, lumbar 05/04/2017   Collapsed vertebra, not elsewhere classified, lumbar region, initial encounter for fracture ([place]) 05/04/2017   Anemia 02/01/2017   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/09/2016   Allergic rhinitis 11/07/2016   Malignant neoplasm of upper-outer quadrant of left female breast ([place]) 10/08/2016   Postmenopausal 09/18/2016   Ache in joint 03/09/2016   Gastroesophageal reflux disease 07/01/2012   Hyperlipidemia LDL goal <100 07/01/2012    Past Surgical History:  Procedure Laterality Date   ABDOMINAL HYSTERECTOMY     AXILLARY SENTINEL NODE BIOPSY Left 04/14/2017   Procedure: AXILLARY SENTINEL NODE BIOPSY;  Surgeon: Olean Ree, MD;  Location: [facility] ORS;  Service: General;  Laterality: Left;   BREAST BIOPSY Left 09/30/2016   US biopsy of 3 areas + chemo   BREAST EXCISIONAL BIOPSY Left 04/14/2017   Left breast invasive cancer left mastectomy   COLONOSCOPY WITH PROPOFOL N/A 06/17/2020   Procedure: COLONOSCOPY WITH PROPOFOL;  Surgeon: Toledo, Benay Pike, MD;  Location: [facility] ENDOSCOPY;  Service: Gastroenterology;  Laterality: N/A;   ESOPHAGOGASTRODUODENOSCOPY (EGD) WITH PROPOFOL  N/A 12/24/2014   Procedure: ESOPHAGOGASTRODUODENOSCOPY (EGD) WITH PROPOFOL;  Surgeon: Manya Silvas, MD;  Location[facility] ENDOSCOPY;  Service: Endoscopy;  Laterality: N/A;   MASTECTOMY Left 04/14/2017   Left breast invasive cancer   PORTACATH PLACEMENT Right 10/27/2016   Procedure: INSERTION PORT-A-CATH;  Surgeon: Nestor Lewandowsky, MD;  Location: [facility] ORS;  Service: General;  Laterality: Right;   TONSILECTOMY, ADENOIDECTOMY, BILATERAL MYRINGOTOMY AND TUBES     TONSILLECTOMY      TOTAL MASTECTOMY Left 04/14/2017   Procedure: TOTAL MASTECTOMY;  Surgeon: Olean Ree, MD;  Location: [facility] ORS;  Service: General;  Laterality: Left;    OB History   No obstetric history on file.      Home Medications    Prior to Admission medications   Medication Sig Start Date End Date Taking? Authorizing Provider  fluticasone (FLONASE) 50 MCG/ACT nasal spray Place into the nose. 09/18/20  Yes [provider]  acetaminophen (TYLENOL) 325 MG tablet Take 2 tablets (650 mg total) by mouth every 6 (six) hours as needed. 01/15/18   Carrie Mew, MD  aspirin 81 MG tablet Take 81 mg by mouth. Takes every 3-4 days.    [provider]  carbamide peroxide (DEBROX) 6.5 % OTIC solution Place 5 drops into the right ear 2 (two) times daily. X 4-7 days as needed right ear wax 08/06/21   McLean-Scocuzza, Nino Glow, MD  Cholecalciferol (VITAMIN D) 2000 units tablet Take 2,000 Units by mouth 3 (three) times a week.  09/01/16   Arnetha Courser, MD  ezetimibe (ZETIA) 10 MG tablet Take 1 tablet (10 mg total) by mouth daily. 03/25/22   McLean-Scocuzza, Nino Glow, MD  ferrous sulfate 325 (65 FE) MG EC tablet Take 325 mg by mouth 3 (three) times a week.  09/01/16   Arnetha Courser, MD  fluticasone (FLONASE) 50 MCG/ACT nasal spray Place 2 sprays into both nostrils daily as needed for allergies or rhinitis. 03/25/22   McLean-Scocuzza, Nino Glow, MD  hydrocortisone 2.5 % lotion Apply topically 2 (two) times daily. Prn 12/20/20   McLean-Scocuzza, Nino Glow, MD  levocetirizine (XYZAL) 5 MG tablet Take 0.5 tablets (2.5 mg total) by mouth every evening. 03/25/22   McLean-Scocuzza, Nino Glow, MD  losartan (COZAAR) 25 MG tablet Take 25 mg by mouth daily. 12/21/21   [provider]  Multiple Vitamin (MULTIVITAMIN WITH MINERALS) TABS tablet Take 1 tablet by mouth 3 (three) times a week.  09/01/16   Lada, Satira Anis, MD  nebivolol (BYSTOLIC) 2.5 MG tablet Take 1 tablet (2.5 mg total) by mouth daily. 03/25/22    McLean-Scocuzza, Nino Glow, MD  neomycin-polymyxin-hydrocortisone (CORTISPORIN) OTIC solution Place 4 drops into the right ear 3 (three) times daily. Right ear x 4-7 days after debrox 08/06/21   McLean-Scocuzza, Nino Glow, MD  pantoprazole (PROTONIX) 40 MG tablet Take 1 tablet (40 mg total) by mouth daily. 30 minutes before food 03/25/22   McLean-Scocuzza, Nino Glow, MD  Polyethyl Glycol-Propyl Glycol (SYSTANE OP) Place 1 drop into both eyes daily as needed (dry eyes).     [provider]  rosuvastatin (CRESTOR) 20 MG tablet Take 1 tablet (20 mg total) by mouth daily. D/c lipitor 40 03/25/22   McLean-Scocuzza, Nino Glow, MD  sodium chloride (OCEAN) 0.65 % SOLN nasal spray Place 1 spray into both nostrils as needed for congestion. 03/25/22   McLean-Scocuzza, Nino Glow, MD    Family History Family History  Problem Relation Age of Onset   Stroke Mother    Stroke  Father    Cancer Sister        with mets as of 09/18/20   Cancer Daughter        as of 05/06/21 dx ? cancer type 28   Depression Neg Hx    Breast cancer Neg Hx     Social History Social History   Tobacco Use   Smoking status: Never   Smokeless tobacco: Never   Tobacco comments:    smoking cessation materials not required  Vaping Use   Vaping Use: Never used  Substance Use Topics   Alcohol use: No    Alcohol/week: 0.0 standard drinks of alcohol   Drug use: No     Allergies   Latex and Penicillins   Review of Systems Review of Systems  HENT:  Positive for ear pain.   Neurological:  Positive for dizziness.     Physical Exam Triage Vital Signs ED Triage Vitals  Enc Vitals Group     BP 04/13/22 1223 127/76     Pulse Rate 04/13/22 1223 71     Resp 04/13/22 1223 15     Temp 04/13/22 1223 98.1 F (36.7 C)     Temp src --      SpO2 04/13/22 1223 97 %     Weight --      Height --      Head Circumference --      Peak Flow --      Pain Score 04/13/22 1224 3     Pain Loc --      Pain Edu? --      Excl. in [place]? --     No data found.  Updated Vital Signs BP 127/76   Pulse 71   Temp 98.1 F (36.7 C)   Resp 15   SpO2 97%   Visual Acuity Right Eye Distance:   Left Eye Distance:   Bilateral Distance:    Right Eye Near:   Left Eye Near:    Bilateral Near:     Physical Exam Vitals reviewed.  Constitutional:      Appearance: Normal appearance.  HENT:     Right Ear: Tympanic membrane and ear canal normal.     Left Ear: Tympanic membrane and ear canal normal.  Neurological:     General: No focal deficit present.     Mental Status: She is alert and oriented to person, place, and time.  Psychiatric:        Mood and Affect: Mood normal.        Behavior: Behavior normal.      UC Treatments / Results  Labs (all labs ordered are listed, but only abnormal results are displayed) Labs Reviewed - No data to display  EKG   Radiology No results found.  Procedures Procedures (including critical care time)  Medications Ordered in UC Medications - No data to display  Initial Impression / Assessment and Plan / UC Course  I have reviewed the triage vital signs and the nursing notes.  Pertinent labs & imaging results that were available during my care of the patient were reviewed by me and considered in my medical decision making (see chart for details).   Physical exam is unremarkable.  TMs WNL bilaterally.  She refers to pain in her head with radiation down the right side of her neck.  She has CT soft tissue neck pending as well as CT angio chest.  With significant chronic medical condition including CKD, atherosclerosis, CKD.  Communicated to patient  that there was no sign of an infection in her right ear which required prescription of an antibiotic.  Asked her to complete the recommended imaging studies and follow-up with her primary care provider on her other chronic issues.   Final Clinical Impressions(s) / UC Diagnoses   Final diagnoses:  None   Discharge Instructions   None     ED Prescriptions   None    PDMP not reviewed this encounter.   Rose Phi, FNP 04/13/22 1238    WallingtonAnnie Main, Noel 04/13/22 1241

## 2022-04-13 NOTE — Telephone Encounter (Signed)
LMTCB for lab results:  Kidney function stable  Troponin heart enzymes normal  Blood cts normal  D dimer elevated given lightheadedness will order CT chest to further work up   [employer] pending

## 2022-04-13 NOTE — Discharge Instructions (Addendum)
Follow-up by completing the ordered imaging studies and seeing your primary care provider

## 2022-04-13 NOTE — Telephone Encounter (Signed)
-----   Message from Delorise Jackson, MD sent at 04/10/2022  5:58 PM EDT ----- Kidney function stable Troponin heart enzymes normal Blood cts normal  D dimer elevated given lightheadedness will order CT chest to further work up   Covid pending

## 2022-04-14 ENCOUNTER — Ambulatory Visit: Payer: Medicare Other | Attending: Medical | Admitting: Medical

## 2022-04-14 ENCOUNTER — Ambulatory Visit: Payer: Medicare Other

## 2022-04-14 ENCOUNTER — Encounter: Payer: Self-pay | Admitting: Medical

## 2022-04-14 VITALS — BP 135/77 | HR 60 | Ht 66.0 in | Wt 176.2 lb

## 2022-04-14 DIAGNOSIS — I10 Essential (primary) hypertension: Secondary | ICD-10-CM

## 2022-04-14 DIAGNOSIS — I2510 Atherosclerotic heart disease of native coronary artery without angina pectoris: Secondary | ICD-10-CM | POA: Diagnosis not present

## 2022-04-14 DIAGNOSIS — R55 Syncope and collapse: Secondary | ICD-10-CM

## 2022-04-14 DIAGNOSIS — E782 Mixed hyperlipidemia: Secondary | ICD-10-CM | POA: Diagnosis not present

## 2022-04-14 DIAGNOSIS — I2584 Coronary atherosclerosis due to calcified coronary lesion: Secondary | ICD-10-CM

## 2022-04-14 LAB — NOVEL CORONAVIRUS, NAA: SARS-CoV-2, NAA: NOT DETECTED

## 2022-04-14 NOTE — Patient Instructions (Signed)
Medication Instructions:   Your physician recommends that you continue on your current medications as directed. Please refer to the Current Medication list given to you today.  *If you need a refill on your cardiac medications before your next appointment, please call your pharmacy*   Lab Work:  None Ordered  If you have labs (blood work) drawn today and your tests are completely normal, you will receive your results only by: [facility] (if you have MyChart) OR A paper copy in the mail If you have any lab test that is abnormal or we need to change your treatment, we will call you to review the results.   Testing/Procedures:  Your physician has recommended that you wear a Zio monitor.   This monitor is a medical device that records the heart's electrical activity. Doctors most often use these monitors to diagnose arrhythmias. Arrhythmias are problems with the speed or rhythm of the heartbeat. The monitor is a small device applied to your chest. You can wear one while you do your normal daily activities. While wearing this monitor if you have any symptoms to push the button and record what you felt. Once you have worn this monitor for the period of time provider prescribed (Usually 14 days), you will return the monitor device in the postage paid box. Once it is returned they will download the data collected and provide [place] with a report which the provider will then review and we will call you with those results. Important tips:  Avoid showering during the first 24 hours of wearing the monitor. Avoid excessive sweating to help maximize wear time. Do not submerge the device, no hot tubs, and no swimming pools. Keep any lotions or oils away from the patch. After 24 hours you may shower with the patch on. Take brief showers with your back facing the shower head.  Do not remove patch once it has been placed because that will interrupt data and decrease adhesive wear time. Push the button  when you have any symptoms and write down what you were feeling. Once you have completed wearing your monitor, remove and place into box which has postage paid and place in your outgoing mailbox.  If for some reason you have misplaced your box then call our office and we can provide another box and/or mail it off for you.   Follow-Up: At [facility], you and your health needs are our priority.  As part of our continuing mission to provide you with exceptional heart care, we have created designated Provider Care Teams.  These Care Teams include your primary Cardiologist (physician) and Advanced Practice Providers (APPs -  Physician Assistants and Nurse Practitioners) who all work together to provide you with the care you need, when you need it.  We recommend signing up for the patient portal called "MyChart".  Sign up information is provided on this After Visit Summary.  MyChart is used to connect with patients for Virtual Visits (Telemedicine).  Patients are able to view lab/test results, encounter notes, upcoming appointments, etc.  Non-urgent messages can be sent to your provider as well.   To learn more about what you can do with MyChart, go to [email or web address].    Your next appointment:   6 - 8  week(s)  The format for your next appointment:   In Person  Provider:   You may see Kate Sable, MD or one of the following Advanced Practice Providers on your designated Care Team:   Murray Hodgkins, NP  Christell Faith, PA-C Cadence Furth, PA-C Gerrie Nordmann, NP   Important Information About [place]

## 2022-04-14 NOTE — Progress Notes (Signed)
Cardiology Office Note:    Date:  04/14/2022   ID:  Maria Patterson, DOB 1940/09/24, MRN [id number]  PCP:  McLean-Scocuzza, Nino Glow, MD  [facility] Cardiologist:  Kate Sable, MD  [facility] Electrophysiologist:  None   Referring MD: Orland Mustard *   Chief Complaint: f/u lightheadedness  History of Present Illness:    Maria Patterson is a 81 y.o. female with a hx of HLD, coronary artery calcification, GERD who presents for heart monitor follow-up.   Chest CT from 09/2018 showed coronary artery calcification, aortic calcification.  Last seen 04/2021 for elevated BP. Decreased of salt intake improved BP.   Today, the patient repots lightheadedness, this started 2 weeks ago. IT comes and goes. It's worse sometimes when she gets up. Can also occur when she is walking. She denies chest pain or shortness of breath. She occasionally has lower leg edema. NO orthopnea or pnd. She does not use a cane or walking. She stays hydrated. According to PCP notes, she is only taking nebivolol. She also plans on seeing ENT.     Past Medical History:  Diagnosis Date   Anemia    Breast cancer ([place]) 2018   Left   Breast mass, left 09/22/2016   RECOMMENDATION: Ultrasound-guided core biopsies of masses at left breast 2 o'clock 2 cm from nipple, left breast 2 o'clock 5 cm from nipple, abnormal left axillary lymph nodes.   COPD (chronic obstructive pulmonary disease) (HCC)    Depression    GERD (gastroesophageal reflux disease)    History of chickenpox    Hx of adenomatous colonic polyps    Hyperglycemia    Hyperlipidemia    Obesity    Personal history of chemotherapy 2018   Left breast   Personal history of radiation therapy 2018   Postmenopausal    Shingles    right eye     Past Surgical History:  Procedure Laterality Date   ABDOMINAL HYSTERECTOMY     AXILLARY SENTINEL NODE BIOPSY Left 04/14/2017   Procedure: AXILLARY SENTINEL NODE BIOPSY;  Surgeon: Olean Ree, MD;   Location: [facility] ORS;  Service: General;  Laterality: Left;   BREAST BIOPSY Left 09/30/2016   US biopsy of 3 areas + chemo   BREAST EXCISIONAL BIOPSY Left 04/14/2017   Left breast invasive cancer left mastectomy   COLONOSCOPY WITH PROPOFOL N/A 06/17/2020   Procedure: COLONOSCOPY WITH PROPOFOL;  Surgeon: Toledo, Benay Pike, MD;  Location: [facility] ENDOSCOPY;  Service: Gastroenterology;  Laterality: N/A;   ESOPHAGOGASTRODUODENOSCOPY (EGD) WITH PROPOFOL N/A 12/24/2014   Procedure: ESOPHAGOGASTRODUODENOSCOPY (EGD) WITH PROPOFOL;  Surgeon: Manya Silvas, MD;  [facility] ENDOSCOPY;  Service: Endoscopy;  Laterality: N/A;   MASTECTOMY Left 04/14/2017   Left breast invasive cancer   PORTACATH PLACEMENT Right 10/27/2016   Procedure: INSERTION PORT-A-CATH;  Surgeon: Nestor Lewandowsky, MD;  Location: [facility] ORS;  Service: General;  Laterality: Right;   TONSILECTOMY, ADENOIDECTOMY, BILATERAL MYRINGOTOMY AND TUBES     TONSILLECTOMY     TOTAL MASTECTOMY Left 04/14/2017   Procedure: TOTAL MASTECTOMY;  Surgeon: Olean Ree, MD;  Location: [facility] ORS;  Service: General;  Laterality: Left;    Current Medications: Current Meds  Medication Sig   acetaminophen (TYLENOL) 325 MG tablet Take 2 tablets (650 mg total) by mouth every 6 (six) hours as needed.   aspirin 81 MG tablet Take 81 mg by mouth. Takes every 3-4 days.   Cholecalciferol (VITAMIN D) 2000 units tablet Take 2,000 Units by mouth 3 (three) times a week.  ezetimibe (ZETIA) 10 MG tablet Take 1 tablet (10 mg total) by mouth daily.   ferrous sulfate 325 (65 FE) MG EC tablet Take 325 mg by mouth 3 (three) times a week.    Multiple Vitamin (MULTIVITAMIN WITH MINERALS) TABS tablet Take 1 tablet by mouth 3 (three) times a week.    pantoprazole (PROTONIX) 40 MG tablet Take 1 tablet (40 mg total) by mouth daily. 30 minutes before food   Polyethyl Glycol-Propyl Glycol (SYSTANE OP) Place 1 drop into both eyes daily as needed (dry eyes).    rosuvastatin (CRESTOR) 20 MG  tablet Take 1 tablet (20 mg total) by mouth daily. D/c lipitor 40     Allergies:   Latex and Penicillins   Social History   Socioeconomic History   Marital status: Widowed    Spouse name: Not on file   Number of children: 1   Years of education: GED   Highest education level: 12th grade  Occupational History   Occupation: Retired  Tobacco Use   Smoking status: Never   Smokeless tobacco: Never   Tobacco comments:    smoking cessation materials not required  Vaping Use   Vaping Use: Never used  Substance and Sexual Activity   Alcohol use: No    Alcohol/week: 0.0 standard drinks of alcohol   Drug use: No   Sexual activity: Not Currently  Other Topics Concern   Not on file  Social History Narrative   Lives at home    1 kid [name] (daughter) [phone]   Social Determinants of Health   Financial Resource Strain: Medium Risk (01/27/2018)   Overall Financial Resource Strain (CARDIA)    Difficulty of Paying Living Expenses: Somewhat hard  Food Insecurity: Food Insecurity Present (01/27/2018)   Hunger Vital Sign    Worried About Running Out of Food in the Last Year: Sometimes true    Ran Out of Food in the Last Year: Sometimes true  Transportation Needs: No Transportation Needs (01/27/2018)   PRAPARE - Hydrologist (Medical): No    Lack of Transportation (Non-Medical): No  Physical Activity: Inactive (01/27/2018)   Exercise Vital Sign    Days of Exercise per Week: 0 days    Minutes of Exercise per Session: 0 min  Stress: No Stress Concern Present (01/27/2018)   [place]    Feeling of Stress : Not at all  Social Connections: Somewhat Isolated (03/08/2018)   Social Connection and Isolation Panel [NHANES]    Frequency of Communication with Friends and Family: More than three times a week    Frequency of Social Gatherings with Friends and Family: More than three times a week     Attends Religious Services: More than 4 times per year    Active Member of [employer] or Organizations: No    Attends Archivist Meetings: Never    Marital Status: Widowed     Family History: The patient's family history includes Cancer in her daughter and sister; Stroke in her father and mother. There is no history of Depression or Breast cancer.  ROS:   Please see the history of present illness.     All other systems reviewed and are negative.  EKGs/Labs/Other Studies Reviewed:    The following studies were reviewed today: N/A  EKG:  EKG is ordered today.  The ekg ordered today demonstrates NSR 60bpm, TWI aVL, no changes  Recent Labs: 03/25/2022: TSH 1.31 04/10/2022: ALT  23; BUN 16; Creat 1.26; Hemoglobin 12.1; Platelets 190; Potassium 4.5; Sodium 143  Recent Lipid Panel    Component Value Date/Time   CHOL 192 03/25/2022 0955   CHOL 250 (H) 11/17/2019 0918   TRIG 88.0 03/25/2022 0955   HDL 65.10 03/25/2022 0955   HDL 66 11/17/2019 0918   CHOLHDL 3 03/25/2022 0955   VLDL 17.6 03/25/2022 0955   LDLCALC 109 (H) 03/25/2022 0955   LDLCALC 159 (H) 11/17/2019 0918   LDLCALC 197 (H) 02/17/2019 1618     Physical Exam:    VS:  BP 135/77 (BP Location: Right Arm, Patient Position: Sitting, Cuff Size: Normal)   Pulse 60   Ht '5\' 6"'$  (1.676 m)   Wt 176 lb 3.2 oz (79.9 kg)   SpO2 95%   BMI 28.44 kg/m     Wt Readings from Last 3 Encounters:  04/14/22 176 lb 3.2 oz (79.9 kg)  04/10/22 177 lb 6.4 oz (80.5 kg)  03/25/22 171 lb (77.6 kg)     GEN:  Well nourished, well developed in no acute distress HEENT: Normal NECK: No JVD; No carotid bruits LYMPHATICS: No lymphadenopathy CARDIAC: RRR, no murmurs, rubs, gallops RESPIRATORY:  Clear to auscultation without rales, wheezing or rhonchi  ABDOMEN: Soft, non-tender, non-distended MUSCULOSKELETAL:  No edema; No deformity  SKIN: Warm and dry NEUROLOGIC:  Alert and oriented x 3 PSYCHIATRIC:  Normal affect   ASSESSMENT:     1. Near syncope   2. Hyperlipidemia, mixed   3. Essential hypertension   4. Coronary artery calcification    PLAN:    In order of problems listed above:  pre-syncope Patient reports dizziness/lightheadedness for the last 2 weeks. It sounds orthostatic in nature, however orthostatics today are negative. She is taking Nebivolol 2.'5mg'$  daily. She reports she stays hydrated and eats well. Labs by PCP were unremarkable. EKG showed NSR with no changes. I will get a 2 week heart monitor. I recommended compression socks and abdominal binder. She will also see ENT for sinus issues.   HLD LDL 109, continue Crestor and Zetia.   HTN BP is good today, continue Nebivolol for now, mMay need to stop this in the future.  Coronary artery calcification Patient denies anginal symptoms. Continue ASA '81mg'$  daily, Crestor 20 mg daily, zetia, and BB. No further ischemic work-up indicated at this time.   Disposition: Follow up in 6 week(s) with MD/APP    Signed, Oluwatoni Rotunno Ninfa Meeker, PA-C  04/14/2022 8:54 AM    [facility]

## 2022-04-15 ENCOUNTER — Telehealth: Payer: Self-pay | Admitting: Internal Medicine

## 2022-04-15 DIAGNOSIS — M26621 Arthralgia of right temporomandibular joint: Secondary | ICD-10-CM | POA: Diagnosis not present

## 2022-04-15 DIAGNOSIS — H9201 Otalgia, right ear: Secondary | ICD-10-CM | POA: Diagnosis not present

## 2022-04-15 NOTE — Telephone Encounter (Signed)
Elevated d dimer rec CT chest to further w/u  Marcelene Butte, Rasheedah R  McLean-Scocuzza, Nino Glow, MD Good Morning!   FYI-I spoke with pt to get sch pt stated she did not want to sch at this time.   Dr. Olivia Mackie McLean-Scocuzza

## 2022-05-01 ENCOUNTER — Encounter: Payer: Self-pay | Admitting: Emergency Medicine

## 2022-05-01 ENCOUNTER — Emergency Department: Payer: Medicare Other

## 2022-05-01 ENCOUNTER — Telehealth: Payer: Self-pay

## 2022-05-01 ENCOUNTER — Other Ambulatory Visit: Payer: Self-pay

## 2022-05-01 ENCOUNTER — Emergency Department
Admission: EM | Admit: 2022-05-01 | Discharge: 2022-05-01 | Disposition: A | Discharge status: Home / Self Care | Payer: Medicare Other | Attending: Emergency Medicine | Admitting: Emergency Medicine

## 2022-05-01 DIAGNOSIS — N3000 Acute cystitis without hematuria: Secondary | ICD-10-CM | POA: Insufficient documentation

## 2022-05-01 DIAGNOSIS — R42 Dizziness and giddiness: Secondary | ICD-10-CM | POA: Insufficient documentation

## 2022-05-01 DIAGNOSIS — M79671 Pain in right foot: Secondary | ICD-10-CM | POA: Insufficient documentation

## 2022-05-01 DIAGNOSIS — J449 Chronic obstructive pulmonary disease, unspecified: Secondary | ICD-10-CM | POA: Diagnosis not present

## 2022-05-01 DIAGNOSIS — M79672 Pain in left foot: Secondary | ICD-10-CM | POA: Insufficient documentation

## 2022-05-01 DIAGNOSIS — M7989 Other specified soft tissue disorders: Secondary | ICD-10-CM | POA: Diagnosis not present

## 2022-05-01 DIAGNOSIS — H9201 Otalgia, right ear: Secondary | ICD-10-CM | POA: Insufficient documentation

## 2022-05-01 DIAGNOSIS — M79604 Pain in right leg: Secondary | ICD-10-CM | POA: Diagnosis not present

## 2022-05-01 LAB — CBC WITH DIFFERENTIAL/PLATELET
Abs Immature Granulocytes: 0.02 10*3/uL (ref 0.00–0.07)
Basophils Absolute: 0 10*3/uL (ref 0.0–0.1)
Basophils Relative: 1 %
Eosinophils Absolute: 0.2 10*3/uL (ref 0.0–0.5)
Eosinophils Relative: 3 %
HCT: 35.7 % — ABNORMAL LOW (ref 36.0–46.0)
Hemoglobin: 11.4 g/dL — ABNORMAL LOW (ref 12.0–15.0)
Immature Granulocytes: 0 %
Lymphocytes Relative: 33 %
Lymphs Abs: 2.1 10*3/uL (ref 0.7–4.0)
MCH: 30.2 pg (ref 26.0–34.0)
MCHC: 31.9 g/dL (ref 30.0–36.0)
MCV: 94.4 fL (ref 80.0–100.0)
Monocytes Absolute: 0.4 10*3/uL (ref 0.1–1.0)
Monocytes Relative: 6 %
Neutro Abs: 3.6 10*3/uL (ref 1.7–7.7)
Neutrophils Relative %: 57 %
Platelets: 197 10*3/uL (ref 150–400)
RBC: 3.78 MIL/uL — ABNORMAL LOW (ref 3.87–5.11)
RDW: 15.4 % (ref 11.5–15.5)
WBC: 6.4 10*3/uL (ref 4.0–10.5)
nRBC: 0 % (ref 0.0–0.2)

## 2022-05-01 LAB — BASIC METABOLIC PANEL
Anion gap: 6 (ref 5–15)
BUN: 19 mg/dL (ref 8–23)
CO2: 25 mmol/L (ref 22–32)
Calcium: 9.2 mg/dL (ref 8.9–10.3)
Chloride: 114 mmol/L — ABNORMAL HIGH (ref 98–111)
Creatinine, Ser: 1.2 mg/dL — ABNORMAL HIGH (ref 0.44–1.00)
GFR, Estimated: 45 mL/min — ABNORMAL LOW (ref >60)
Glucose, Bld: 87 mg/dL (ref 70–99)
Potassium: 3.9 mmol/L (ref 3.5–5.1)
Sodium: 145 mmol/L (ref 135–145)

## 2022-05-01 LAB — URINALYSIS, ROUTINE W REFLEX MICROSCOPIC
Bacteria, UA: NONE SEEN
Bilirubin Urine: NEGATIVE
Glucose, UA: NEGATIVE mg/dL
Hgb urine dipstick: NEGATIVE
Ketones, ur: NEGATIVE mg/dL
Nitrite: NEGATIVE
Protein, ur: NEGATIVE mg/dL
Specific Gravity, Urine: 1.018 (ref 1.005–1.030)
WBC, UA: >50 WBC/hpf — ABNORMAL HIGH (ref 0–5)
pH: 6 (ref 5.0–8.0)

## 2022-05-01 MED ORDER — CEPHALEXIN 500 MG PO CAPS: 500.0000 mg | ORAL_CAPSULE | Freq: Four times a day (QID) | ORAL | 0 refills | Status: AC

## 2022-05-01 MED ORDER — CEPHALEXIN 500 MG PO CAPS
500.0000 mg | ORAL_CAPSULE | Freq: Once | ORAL | Status: AC
  Administered 2022-05-01: 500 mg via ORAL
  Filled 2022-05-01: qty 1

## 2022-05-01 NOTE — Telephone Encounter (Signed)
Pt has been advised to go to the ED  she stated she will be going over there shortly. She was advised if she needs to she can call 911 to have an ambulance pick her up she stated I believe I can drive over there.

## 2022-05-01 NOTE — ED Provider Notes (Signed)
[facility] Provider Note  Patient Contact: 9:21 PM (approximate)   History   Ear Pain and Leg Pain   HPI  Maria Patterson is a 81 y.o. female with a history of GERD, hyperlipidemia, hyperglycemia, COPD, presents to the emergency department with multiple medical complaints including lightheadedness, right ear pain, bilateral lower extremity pain and bilateral feet pain.  She states that she noticed the symptoms today.  No chest pain, chest tightness or shortness of breath.  No vomiting or abdominal pain.      Physical Exam   Triage Vital Signs: ED Triage Vitals  Enc Vitals Group     BP 05/01/22 1639 (!) 163/80     Pulse Rate 05/01/22 1634 69     Resp 05/01/22 1634 16     Temp 05/01/22 1637 98 F (36.7 C)     Temp Source 05/01/22 1637 Oral     SpO2 05/01/22 1634 99 %     Weight --      Height --      Head Circumference --      Peak Flow --      Pain Score 05/01/22 1635 3     Pain Loc --      Pain Edu? --      Excl. in [place]? --     Most recent vital signs: Vitals:   05/01/22 1637 05/01/22 1639  BP:  (!) 163/80  Pulse:    Resp:    Temp: 98 F (36.7 C)   SpO2:       General: Alert and in no acute distress. Eyes:  PERRL. EOMI. Head: No acute traumatic findings ENT:      Nose: No congestion/rhinnorhea.      Mouth/Throat: Mucous membranes are moist. Neck: No stridor. No cervical spine tenderness to palpation. Cardiovascular:  Good peripheral perfusion Respiratory: Normal respiratory effort without tachypnea or retractions. Lungs CTAB. Good air entry to the bases with no decreased or absent breath sounds. Gastrointestinal: Bowel sounds 4 quadrants. Soft and nontender to palpation. No guarding or rigidity. No palpable masses. No distention. No CVA tenderness. Musculoskeletal: Full range of motion to all extremities.  Neurologic:  No gross focal neurologic deficits are appreciated.  Skin: No pitting edema bilaterally. Other:   ED Results  / Procedures / Treatments   Labs (all labs ordered are listed, but only abnormal results are displayed) Labs Reviewed  BASIC METABOLIC PANEL - Abnormal; Notable for the following components:      Result Value   Chloride 114 (*)    Creatinine, Ser 1.20 (*)    GFR, Estimated 45 (*)    All other components within normal limits  CBC WITH DIFFERENTIAL/PLATELET - Abnormal; Notable for the following components:   RBC 3.78 (*)    Hemoglobin 11.4 (*)    HCT 35.7 (*)    All other components within normal limits  URINALYSIS, ROUTINE W REFLEX MICROSCOPIC - Abnormal; Notable for the following components:   Color, Urine YELLOW (*)    APPearance HAZY (*)    Leukocytes,Ua LARGE (*)    WBC, UA >50 (*)    All other components within normal limits  URINE CULTURE        RADIOLOGY  I personally viewed and evaluated these images as part of my medical decision making, as well as reviewing the written report by the radiologist.  ED Provider Interpretation: No evidence of DVT of the right lower extremity.   PROCEDURES:  Critical Care performed: No  Procedures   MEDICATIONS ORDERED IN ED: Medications  cephALEXin (KEFLEX) capsule 500 mg (has no administration in time range)     IMPRESSION / MDM / ASSESSMENT AND PLAN / ED COURSE  I reviewed the triage vital signs and the nursing notes.                              Assessment and plan UTI 81 year old female presents to the emergency department with multiple medical complaints stated in the HPI.  Patient was hypertensive at triage but vital signs were otherwise reassuring  CBC and BMP were largely within reference range and consistent with patient's baseline.  Urinalysis concerning for UTI with large leuks and greater than 50 white blood cells.  Urine culture in process.  Patient was given her first dose of Keflex in the emergency department and discharged with Keflex.  Return precautions were given to return with new or worsening  symptoms.   FINAL CLINICAL IMPRESSION(S) / ED DIAGNOSES   Final diagnoses:  Acute cystitis without hematuria     Rx / DC Orders   ED Discharge Orders          Ordered    cephALEXin (KEFLEX) 500 MG capsule  4 times daily        05/01/22 2245             Note:  This document was prepared using Dragon voice recognition software and may include unintentional dictation errors.   [place], [place], PA-C 05/01/22 2254    Duffy Bruce, MD 05/09/22 [id number]

## 2022-05-01 NOTE — ED Triage Notes (Signed)
Pt to ED via POV, pt states that she is having pain in her right ear. Pt states that this has been going on a few days, pt has also had some sinus symptoms. Pt reports that this morning she had pain in both of her feet. Pt reports that the pain is some better but she feels like her right leg is swollen and it is still a little more painful than the left leg. Pt is currently in NAD.

## 2022-05-01 NOTE — Discharge Instructions (Addendum)
Take Keflex four times daily for the next seven days.  

## 2022-05-01 NOTE — ED Provider Triage Note (Signed)
  Emergency Medicine Provider Triage Evaluation Note  Maria Patterson , a 81 y.o.female,  was evaluated in triage.  Pt complains of ear pain.  Patient reports ongoing sinus congestion and right-sided ear pain for the past 1 month.  She has additionally been having pain in both of her legs as well, and states that she just "does not feel right".   Review of Systems  Positive: Otalgia, back pain, sinus congestion Negative: Denies fever, chest pain, vomiting  Physical Exam   Vitals:   05/01/22 1637 05/01/22 1639  BP:  (!) 163/80  Pulse:    Resp:    Temp: 98 F (36.7 C)   SpO2:     Gen:   Awake, no distress   Resp:  Normal effort  MSK:   Moves extremities without difficulty  Other:    Medical Decision Making  Given the patient's initial medical screening exam, the following diagnostic evaluation has been ordered. The patient will be placed in the appropriate treatment space, once one is available, to complete the evaluation and treatment. I have discussed the plan of care with the patient and I have advised the patient that an ED physician or mid-level practitioner will reevaluate their condition after the test results have been received, as the results may give them additional insight into the type of treatment they may need.    Diagnostics: Labs  Treatments: none immediately   Teodoro Spray, [place] 05/01/22 1643

## 2022-05-01 NOTE — Telephone Encounter (Signed)
Agree Rec ED for evaluation  Dr. Olivia Mackie McLean-Scocuzza

## 2022-05-03 LAB — URINE CULTURE
Culture: <10000 — AB
Special Requests: NORMAL

## 2022-05-05 ENCOUNTER — Telehealth: Payer: Self-pay

## 2022-05-05 NOTE — Telephone Encounter (Signed)
     Reason for call: ED-Follow up call   Patient visited [facility] on 05/01/2022 for Ear and Leg Pain   Telephone encounter attempt :  1st Attempt  A HIPAA compliant voice message was left requesting a return call.  Instructed patient to call back at [phone] at their earliest convenience.  [place] management  [place], [place]  Main Phone: [phone]  E-mail: [email or web address]  Website: [email or web address]

## 2022-05-13 ENCOUNTER — Telehealth: Payer: Self-pay

## 2022-05-13 NOTE — Telephone Encounter (Addendum)
      Reason for call: ED-Follow up call     Patient visited [facility] on 05/01/2022 for Ear and Leg Pain     Telephone encounter attempt :  2nd Attempt   A HIPAA compliant voice message was left requesting a return call.  Instructed patient to call back at [phone] at their earliest [place], [place], [place]  Main Phone: [phone]  E-mail: [email or web address]  Website: [email or web address]

## 2022-05-29 ENCOUNTER — Telehealth: Payer: Self-pay

## 2022-05-29 NOTE — Telephone Encounter (Signed)
     Reason for call: ED-Follow up call    Patient visited [facility] on 05/01/2022 for Ear and Leg Pain    Telephone encounter attempt :  3rd Attempt  A HIPAA compliant voice message was left requesting a return call.  Instructed patient to call back at [phone] at their earliest convenience.  [place] management  [place], [place]  Main Phone: [phone]  E-mail: [email or web address]  Website: [email or web address]

## 2022-06-04 ENCOUNTER — Ambulatory Visit: Payer: Medicare Other | Admitting: Cardiology

## 2022-07-08 ENCOUNTER — Ambulatory Visit (INDEPENDENT_AMBULATORY_CARE_PROVIDER_SITE_OTHER): Payer: Medicare Other | Admitting: Family Medicine

## 2022-07-08 ENCOUNTER — Encounter: Payer: Self-pay | Admitting: Family Medicine

## 2022-07-08 VITALS — BP 130/84 | HR 67 | Temp 97.6°F | Ht 66.0 in | Wt 176.8 lb

## 2022-07-08 DIAGNOSIS — C50412 Malignant neoplasm of upper-outer quadrant of left female breast: Secondary | ICD-10-CM

## 2022-07-08 DIAGNOSIS — I151 Hypertension secondary to other renal disorders: Secondary | ICD-10-CM | POA: Diagnosis not present

## 2022-07-08 DIAGNOSIS — I890 Lymphedema, not elsewhere classified: Secondary | ICD-10-CM

## 2022-07-08 DIAGNOSIS — E785 Hyperlipidemia, unspecified: Secondary | ICD-10-CM | POA: Diagnosis not present

## 2022-07-08 DIAGNOSIS — J3089 Other allergic rhinitis: Secondary | ICD-10-CM

## 2022-07-08 DIAGNOSIS — E669 Obesity, unspecified: Secondary | ICD-10-CM

## 2022-07-08 DIAGNOSIS — E611 Iron deficiency: Secondary | ICD-10-CM

## 2022-07-08 DIAGNOSIS — N1832 Chronic kidney disease, stage 3b: Secondary | ICD-10-CM

## 2022-07-08 DIAGNOSIS — Z171 Estrogen receptor negative status [ER-]: Secondary | ICD-10-CM | POA: Diagnosis not present

## 2022-07-08 DIAGNOSIS — I700 Atherosclerosis of aorta: Secondary | ICD-10-CM

## 2022-07-08 DIAGNOSIS — E559 Vitamin D deficiency, unspecified: Secondary | ICD-10-CM

## 2022-07-08 DIAGNOSIS — R7303 Prediabetes: Secondary | ICD-10-CM

## 2022-07-08 MED ORDER — FLUTICASONE PROPIONATE 50 MCG/ACT NA SUSP: 2.0000 | Freq: Every day | NASAL | 11 refills | Status: DC | PRN

## 2022-07-08 NOTE — Assessment & Plan Note (Signed)
Refill Flonase 2 sprays daily

## 2022-07-08 NOTE — Patient Instructions (Addendum)
It was a pleasure meeting you today. Thank you for allowing me to take part in your health care.  Our goals for today as we discussed include:  For your blood pressure Continue nebivolol 2.5 mg daily. Please call the office and let us know if you are taking the losartan 25 mg daily. Follow-up with cardiology evaluation of your heart.  Follow-up with nephrology as scheduled.  Follow-up with PCP in 4 months for blood pressure check  Schedule appointment for annual visit in September 2024.   Please schedule lab appointment for fasting blood work 1 week prior to annual visit.   Recommend Shingles vaccine.  This is a 2 dose series and can be given at your local pharmacy.  Please talk to your pharmacist about this.   Recommend RSV vaccine.  This can be given at your local pharmacy.  Please talk to your pharmacist about this.  If you have any questions or concerns, please do not hesitate to call the office at [phone].  I look forward to our next visit and until then take care and stay safe.  Regards,   Carollee Leitz, MD   [facility]

## 2022-07-08 NOTE — Progress Notes (Signed)
SUBJECTIVE:   Chief Complaint  Patient presents with   Establish Care    Transfer of Care   HPI Patient presents to clinic to transfer care  Hypertension Asymptomatic.  Currently takes nebivolol 2.5 mg daily.  Not sure if taking losartan 25 mg daily.  Hyperlipidemia Taking Crestor 20 mg daily.  Tolerating medication well.  Anemia Asymptomatic.  Takes ferrous sulfate 3 times per week.  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  Follows with general surgery and oncology.   PERTINENT PMH / PSH: CKD stage IIIb Hyperlipidemia Bilateral total mastectomy Hypertension  OBJECTIVE:  BP 130/84   Pulse 67   Temp 97.6 F (36.4 C)   Ht '5\' 6"'$  (1.676 m)   Wt 176 lb 12.8 oz (80.2 kg)   SpO2 99%   BMI 28.54 kg/m    Physical Exam Vitals reviewed.  Constitutional:      General: She is not in acute distress.    Appearance: She is not ill-appearing.  HENT:     Head: Normocephalic.     Nose: Nose normal.  Eyes:     Conjunctiva/sclera: Conjunctivae normal.  Cardiovascular:     Rate and Rhythm: Normal rate and regular rhythm.     Heart sounds: Normal heart sounds.  Pulmonary:     Effort: Pulmonary effort is normal.     Breath sounds: Normal breath sounds.  Abdominal:     General: Abdomen is flat. Bowel sounds are normal.     Palpations: Abdomen is soft.  Musculoskeletal:        General: Normal range of motion.     Cervical back: Normal range of motion.  Neurological:     Mental Status: She is alert and oriented to person, place, and time. Mental status is at baseline.  Psychiatric:        Mood and Affect: Mood normal.        Behavior: Behavior normal.        Thought Content: Thought content normal.        Judgment: Judgment normal.     ASSESSMENT/PLAN:  Stage 3b chronic kidney disease (HCC) Assessment & Plan: Chronic.  Stable.  Patient unsure if she is taking losartan as previously prescribed.  Need to confirm with patient and  nephrology. Follows with nephrology. Avoid nephrotoxic agents.  Orders: -     Comprehensive metabolic panel; Future  Aortic atherosclerosis (HCC) Assessment & Plan: Continue Crestor 20 mg daily.   Orders: -     Lipid panel; Future  Malignant neoplasm of upper-outer quadrant of left breast in female, estrogen receptor negative ([place]) Assessment & Plan: Chronic.  Stable.  Follows with oncology   Hyperlipidemia LDL goal <100 -     Lipid panel; Future  Pre-diabetes -     Hemoglobin A1c; Future  Iron deficiency -     CBC with Differential/Platelet; Future  Vitamin D deficiency -     VITAMIN D 25 Hydroxy (Vit-D Deficiency, Fractures); Future  Obesity (BMI 30.0-34.9) -     Vitamin B12; Future  Non-seasonal allergic rhinitis, unspecified trigger Assessment & Plan: Refill Flonase 2 sprays daily  Orders: -     Fluticasone Propionate; Place 2 sprays into both nostrils daily as needed for allergies or rhinitis.  Dispense: 16 g; Refill: 11  Hypertension secondary to other renal disorders Assessment & Plan: Chronic.  Stable.  At goal per JNC 8 guidelines for age less than 140/90.  Tolerating medication well.  Unsure if taking ARB.  Currently on beta-blocker Continue Bystolic 2.5 mg daily Need to confirm if taking ARB     Lymphedema of arm Assessment & Plan: Chronic.  Stable. Follows with general surgery.   HCM Recommend shingles vaccine Recommend RSV vaccine  PDMP reviewed  Return in about 4 months (around 11/07/2022) for PCP, BP.  Carollee Leitz, MD

## 2022-07-08 NOTE — Assessment & Plan Note (Signed)
Continue Crestor 20 mg daily. 

## 2022-07-16 ENCOUNTER — Telehealth: Payer: Self-pay | Admitting: Family Medicine

## 2022-07-16 NOTE — Telephone Encounter (Signed)
Patient called office and wanted to speak to [facility]. She said her losartan (COZAAR) 25 MG tablet is 2.'5mg'$  tables. Normal office ask patient do you mean 25 mg, she stated no 2.'5mg'$ .

## 2022-07-16 NOTE — Telephone Encounter (Signed)
Called Patient about her medication. The Patient was confused on the Losartan and Bystolic but I ask her to get her medication bottles out and tell me what is the name and mg of them. We got it straightened out so the Patient needs a refill on Bystolic but the refill is already at [facility] so I told her to call [facility] and ask them to fill it. Patient verbalized understanding and is agreeable.

## 2022-07-24 ENCOUNTER — Encounter: Payer: Self-pay | Admitting: Family Medicine

## 2022-07-24 ENCOUNTER — Telehealth: Payer: Self-pay | Admitting: Family Medicine

## 2022-07-24 DIAGNOSIS — E611 Iron deficiency: Secondary | ICD-10-CM | POA: Insufficient documentation

## 2022-07-24 DIAGNOSIS — E559 Vitamin D deficiency, unspecified: Secondary | ICD-10-CM | POA: Insufficient documentation

## 2022-07-24 NOTE — Assessment & Plan Note (Signed)
Chronic.  Stable.  Patient unsure if she is taking losartan as previously prescribed.  Need to confirm with patient and nephrology. Follows with nephrology. Avoid nephrotoxic agents.

## 2022-07-24 NOTE — Assessment & Plan Note (Signed)
Chronic.  Stable. Follows with general surgery.

## 2022-07-24 NOTE — Assessment & Plan Note (Signed)
Chronic.  Stable.  At goal per JNC 8 guidelines for age less than 140/90.  Tolerating medication well.  Unsure if taking ARB.  Currently on beta-blocker Continue Bystolic 2.5 mg daily Need to confirm if taking ARB

## 2022-07-24 NOTE — Assessment & Plan Note (Addendum)
Chronic.  Stable.   Follows with oncology. 

## 2022-07-24 NOTE — Telephone Encounter (Signed)
Please call Nephrology clinic to confirm if patient to be taking Losartan 25 mg daily.  Will need to clarify with patient if she is taking this as well as the Bystolic.  Thank you

## 2022-07-27 NOTE — Telephone Encounter (Signed)
Maria Patterson at Dr. Keturah Barre office stated yes the Patient is supposed to take 25 mg of Losartan daily. The Patient states yes she is taking the Losartan and Bystolic.

## 2022-10-06 ENCOUNTER — Other Ambulatory Visit: Payer: Self-pay | Admitting: Family Medicine

## 2022-10-06 DIAGNOSIS — Z1231 Encounter for screening mammogram for malignant neoplasm of breast: Secondary | ICD-10-CM

## 2022-10-08 ENCOUNTER — Inpatient Hospital Stay: Payer: 59 | Admitting: Nurse Practitioner

## 2022-10-22 ENCOUNTER — Ambulatory Visit
Admission: RE | Admit: 2022-10-22 | Discharge: 2022-10-22 | Disposition: A | Discharge status: Home / Self Care | Payer: 59 | Source: Ambulatory Visit | Attending: Family Medicine | Admitting: Family Medicine

## 2022-10-22 DIAGNOSIS — Z1231 Encounter for screening mammogram for malignant neoplasm of breast: Secondary | ICD-10-CM | POA: Diagnosis not present

## 2022-10-28 ENCOUNTER — Telehealth: Payer: Self-pay | Admitting: Family Medicine

## 2022-10-28 NOTE — Telephone Encounter (Signed)
Contacted Rennae Waldner to schedule their annual wellness visit. Appointment made for 10/29/2022.  Thank you,  [facility] Support [facility] Direct dial  [phone]

## 2022-10-29 ENCOUNTER — Ambulatory Visit (INDEPENDENT_AMBULATORY_CARE_PROVIDER_SITE_OTHER): Payer: 59

## 2022-10-29 VITALS — Ht 66.0 in | Wt 176.0 lb

## 2022-10-29 DIAGNOSIS — Z0000 Encounter for general adult medical examination without abnormal findings: Secondary | ICD-10-CM

## 2022-10-29 NOTE — Progress Notes (Signed)
Subjective:   Maria Patterson is a 82 y.o. female who presents for Medicare Annual (Subsequent) preventive examination.  Review of Systems    No ROS.  Medicare Wellness Virtual Visit.  Visual/audio telehealth visit, UTA vital signs.   See social history for additional risk factors.   Cardiac Risk Factors include: advanced age (>29men, >53 women);hypertension     Objective:    Today's Vitals   10/29/22 1347  Weight: 176 lb (79.8 kg)  Height: 5\' 6"  (1.676 m)   Body mass index is 28.41 kg/m.     10/29/2022    1:39 PM 05/01/2022    4:36 PM 10/06/2021    2:09 PM 06/17/2020    9:46 AM 02/22/2020   11:19 AM 08/24/2019   11:27 AM 03/29/2019    8:54 AM  Advanced Directives  Does Patient Have a Medical Advance Directive? No No No No No No No  Would patient like information on creating a medical advance directive? No - Patient declined No - Patient declined No - Patient declined    No - Patient declined    Current Medications (verified) Outpatient Encounter Medications as of 10/29/2022  Medication Sig   acetaminophen (TYLENOL) 325 MG tablet Take 2 tablets (650 mg total) by mouth every 6 (six) hours as needed.   aspirin 81 MG tablet Take 81 mg by mouth. Takes every 3-4 days.   Cholecalciferol (VITAMIN D) 2000 units tablet Take 2,000 Units by mouth 3 (three) times a week.    ferrous sulfate 325 (65 FE) MG EC tablet Take 325 mg by mouth 3 (three) times a week.    fluticasone (FLONASE) 50 MCG/ACT nasal spray Place 2 sprays into both nostrils daily as needed for allergies or rhinitis.   losartan (COZAAR) 25 MG tablet Take 25 mg by mouth daily.   Multiple Vitamin (MULTIVITAMIN WITH MINERALS) TABS tablet Take 1 tablet by mouth 3 (three) times a week.    nebivolol (BYSTOLIC) 2.5 MG tablet Take 1 tablet (2.5 mg total) by mouth daily.   pantoprazole (PROTONIX) 40 MG tablet Take 1 tablet (40 mg total) by mouth daily. 30 minutes before food   Polyethyl Glycol-Propyl Glycol (SYSTANE OP) Place 1 drop  into both eyes daily as needed (dry eyes).    rosuvastatin (CRESTOR) 20 MG tablet Take 1 tablet (20 mg total) by mouth daily. D/c lipitor 40   sodium chloride (OCEAN) 0.65 % SOLN nasal spray Place 1 spray into both nostrils as needed for congestion.   No facility-administered encounter medications on file as of 10/29/2022.    Allergies (verified) Latex and Penicillins   History: Past Medical History:  Diagnosis Date   Ache in joint 03/09/2016   Anemia    Anxiety and depression 05/12/2019   Breast cancer 2018   Left   Breast mass, left 09/22/2016   RECOMMENDATION: Ultrasound-guided core biopsies of masses at left breast 2 o'clock 2 cm from nipple, left breast 2 o'clock 5 cm from nipple, abnormal left axillary lymph nodes.   Collapsed vertebra, not elsewhere classified, lumbar region, initial encounter for fracture 05/04/2017   Formatting of this note might be different from the original.  Last Assessment & Plan:   Discussed finding on xray; will get DEXA scan and treat as indicated; 3 servings of calcium a day, 1000 iu of vit D3 daily; fall precautions   Colon polyps 03/17/2019   Compression fracture of first lumbar vertebra 05/04/2017   COPD (chronic obstructive pulmonary disease)    Depression  Dermatitis 05/12/2019   GERD (gastroesophageal reflux disease)    Herpes 05/12/2019   History of breast cancer 02/04/2021   History of chickenpox    Hx of adenomatous colonic polyps    Hyperglycemia    Hyperlipidemia    Hypomagnesemia 05/04/2017   Insomnia 09/26/2017   Multiple renal cysts 05/15/2021   Obesity    Personal history of chemotherapy 2018   Left breast   Personal history of radiation therapy 2018   Postmenopausal    Postmenopausal 09/18/2016   Shingles    right eye    Spider vein of lower extremity 12/20/2020   Tubular adenoma 01/18/2020   Past Surgical History:  Procedure Laterality Date   ABDOMINAL HYSTERECTOMY     AXILLARY SENTINEL NODE BIOPSY Left 04/14/2017    Procedure: AXILLARY SENTINEL NODE BIOPSY;  Surgeon: Henrene Dodge, MD;  Location: [facility] ORS;  Service: General;  Laterality: Left;   BREAST BIOPSY Left 09/30/2016   US biopsy of 3 areas + chemo   BREAST EXCISIONAL BIOPSY Left 04/14/2017   Left breast invasive cancer left mastectomy   COLONOSCOPY WITH PROPOFOL N/A 06/17/2020   Procedure: COLONOSCOPY WITH PROPOFOL;  Surgeon: Toledo, Boykin Nearing, MD;  Location: [facility] ENDOSCOPY;  Service: Gastroenterology;  Laterality: N/A;   ESOPHAGOGASTRODUODENOSCOPY (EGD) WITH PROPOFOL N/A 12/24/2014   Procedure: ESOPHAGOGASTRODUODENOSCOPY (EGD) WITH PROPOFOL;  Surgeon: Scot Jun, MD;  Location: [facility] ENDOSCOPY;  Service: Endoscopy;  Laterality: N/A;   MASTECTOMY Left 04/14/2017   Left breast invasive cancer   PORTACATH PLACEMENT Right 10/27/2016   Procedure: INSERTION PORT-A-CATH;  Surgeon: Hulda Marin, MD;  Location: [facility] ORS;  Service: General;  Laterality: Right;   TONSILECTOMY, ADENOIDECTOMY, BILATERAL MYRINGOTOMY AND TUBES     TONSILLECTOMY     TOTAL MASTECTOMY Left 04/14/2017   Procedure: TOTAL MASTECTOMY;  Surgeon: Henrene Dodge, MD;  Location: [facility] ORS;  Service: General;  Laterality: Left;   Family History  Problem Relation Age of Onset   Stroke Mother    Stroke Father    Cancer Sister        with mets as of 09/18/20   Cancer Daughter        as of 05/06/21 dx ? cancer type 58   Depression Neg Hx    Breast cancer Neg Hx    Social History   Socioeconomic History   Marital status: Widowed    Spouse name: Not on file   Number of children: 1   Years of education: GED   Highest education level: 12th grade  Occupational History   Occupation: Retired  Tobacco Use   Smoking status: Never   Smokeless tobacco: Never   Tobacco comments:    smoking cessation materials not required  Vaping Use   Vaping Use: Never used  Substance and Sexual Activity   Alcohol use: No    Alcohol/week: 0.0 standard drinks of alcohol   Drug use: No   Sexual  activity: Not Currently  Other Topics Concern   Not on file  Social History Narrative   Lives at home    1 kid Randel Books (daughter) [phone]   Social Determinants of Health   Financial Resource Strain: Low Risk  (10/29/2022)   Overall Financial Resource Strain (CARDIA)    Difficulty of Paying Living Expenses: Not very hard  Food Insecurity: No Food Insecurity (10/29/2022)   Hunger Vital Sign    Worried About Running Out of Food in the Last Year: Never true    Ran Out of Food in the  Last Year: Never true  Transportation Needs: No Transportation Needs (10/29/2022)   PRAPARE - Administrator, Civil Service (Medical): No    Lack of Transportation (Non-Medical): No  Physical Activity: Insufficiently Active (10/29/2022)   Exercise Vital Sign    Days of Exercise per Week: 4 days    Minutes of Exercise per Session: 20 min  Stress: No Stress Concern Present (10/29/2022)   [employer] of Occupational Health - Occupational Stress Questionnaire    Feeling of Stress : Not at all  Social Connections: Moderately Isolated (10/29/2022)   Social Connection and Isolation Panel [NHANES]    Frequency of Communication with Friends and Family: More than three times a week    Frequency of Social Gatherings with Friends and Family: More than three times a week    Attends Religious Services: More than 4 times per year    Active Member of [employer] or Organizations: No    Attends Banker Meetings: Never    Marital Status: Widowed    Tobacco Counseling Counseling given: Not Answered Tobacco comments: smoking cessation materials not required   Clinical Intake:  Pre-visit preparation completed: Yes           How often do you need to have someone help you when you read instructions, pamphlets, or other written materials from your doctor or pharmacy?: 1 - Never    Interpreter Needed?: No      Activities of Daily Living    10/29/2022    1:41 PM  In your  present state of health, do you have any difficulty performing the following activities:  Hearing? 0  Vision? 0  Difficulty concentrating or making decisions? 0  Walking or climbing stairs? 0  Dressing or bathing? 0  Doing errands, shopping? 0  Preparing Food and eating ? N  Using the Toilet? N  In the past six months, have you accidently leaked urine? N  Do you have problems with loss of bowel control? N  Managing your Medications? N  Managing your Finances? N  Housekeeping or managing your Housekeeping? N    Patient Care Team: Dana Allan, MD as PCP - General (Family Medicine) Debbe Odea, MD as PCP - Cardiology (Cardiology) Scarlett Presto, RN (Inactive) as Oncology Nurse Navigator Ricarda Frame, MD as Consulting Physician (General Surgery) Jeralyn Ruths, MD as Consulting Physician (Oncology) Henrene Dodge, MD as Consulting Physician (Surgery) Carmina Miller, MD as Referring Physician (Radiation Oncology)  Indicate any recent Medical Services you may have received from other than [facility] providers in the past year (date may be approximate).     Assessment:   This is a routine wellness examination for Maria Patterson.  I connected with  Trudie Buckler on 10/29/22 by a audio enabled telemedicine application and verified that I am speaking with the correct person using two identifiers.  Patient Location: Home  Provider Location: Office/Clinic  I discussed the limitations of evaluation and management by telemedicine. The patient expressed understanding and agreed to proceed.   Hearing/Vision screen Hearing Screening - Comments:: Patient is able to hear conversational tones without difficulty.  No issues reported.   Vision Screening - Comments:: Sees [facility] for annual eye exams  Dietary issues and exercise activities discussed: Current Exercise Habits: Home exercise routine, Type of exercise: walking, Time (Minutes): 30, Frequency (Times/Week): 4, Weekly  Exercise (Minutes/Week): 120, Intensity: Mild   Goals Addressed               This Visit's  Progress     Patient Stated     DIET - INCREASE WATER INTAKE (pt-stated)        Recommend to drink at least 6-8 8oz glasses of water per day Drink less tea       Depression Screen    10/29/2022    1:36 PM 04/10/2022    2:22 PM 03/25/2022    9:08 AM 12/23/2021    9:52 AM 05/06/2021    9:38 AM 02/04/2021    8:36 AM 01/18/2020    8:37 AM  PHQ 2/9 Scores  PHQ - 2 Score 0 0 0  0 0 2  PHQ- 9 Score       6  Exception Documentation    Patient refusal       Fall Risk    10/29/2022    1:40 PM 07/08/2022    8:59 AM 04/10/2022    2:22 PM 03/25/2022    9:08 AM 12/23/2021    9:52 AM  Fall Risk   Falls in the past year? 0 0 0 0 0  Number falls in past yr: 0 0 0 0 0  Injury with Fall? 0 0 0 0 0  Risk for fall due to :  No Fall Risks Other (Comment) No Fall Risks No Fall Risks  Follow up Falls evaluation completed;Falls prevention discussed Falls evaluation completed Falls evaluation completed Falls evaluation completed Falls evaluation completed    FALL RISK PREVENTION PERTAINING TO THE HOME: Home free of loose throw rugs in walkways, pet beds, electrical cords, etc? Yes  Adequate lighting in your home to reduce risk of falls? Yes   ASSISTIVE DEVICES UTILIZED TO PREVENT FALLS: Life alert? No  Use of a cane, walker or w/c? No  Grab bars in the bathroom? No  Shower chair or bench in shower? No  Elevated toilet seat or a handicapped toilet? No   TIMED UP AND GO: Was the test performed? No .   Cognitive Function:        10/29/2022    1:45 PM 01/27/2018   12:53 PM 09/18/2016   11:43 AM  6CIT Screen  What Year? 0 points 0 points 0 points  What month? 0 points 0 points 0 points  What time? 0 points 0 points 0 points  Count back from 20 0 points 0 points 0 points  Months in reverse 0 points 0 points 0 points  Repeat phrase 0 points 6 points 2 points  Total Score 0 points 6 points 2 points     Immunizations Immunization History  Administered Date(s) Administered   Influenza, High Dose Seasonal PF 09/10/2015, 09/01/2016, 04/15/2017, 04/27/2018, 04/14/2019   Influenza-Unspecified 03/28/2021, 07/02/2022   Moderna Covid-19 Vaccine Bivalent Booster 7610yrs & up 03/28/2021   Moderna SARS-COV2 Booster Vaccination 06/20/2020   Moderna Sars-Covid-2 Vaccination 08/07/2019, 09/04/2019   Pneumococcal Conjugate-13 09/10/2015   Pneumococcal Polysaccharide-23 05/04/2017   Tdap 02/05/2021   Unspecified SARS-COV-2 Vaccination 07/02/2022   Covid-19 vaccine status: Completed vaccines x4.  Shingrix Completed?: No.    Education has been provided regarding the importance of this vaccine. Patient has been advised to call insurance company to determine out of pocket expense if they have not yet received this vaccine. Advised may also receive vaccine at local pharmacy or Health Dept. Verbalized acceptance and understanding.  Screening Tests Health Maintenance  Topic Date Due   COVID-19 Vaccine (5 - 2023-24 season) 11/14/2022 (Originally 08/27/2022)   Zoster Vaccines- Shingrix (1 of 2) 01/28/2023 (Originally 07/06/1960)   INFLUENZA VACCINE  02/18/2023   MAMMOGRAM  10/22/2023   Medicare Annual Wellness (AWV)  10/29/2023   DTaP/Tdap/Td (2 - Td or Tdap) 02/06/2031   Pneumonia Vaccine 28+ Years old  Completed   DEXA SCAN  Completed   HPV VACCINES  Aged Out    Health Maintenance There are no preventive care reminders to display for this patient.  Lung Cancer Screening: (Low Dose CT Chest recommended if Age 80-80 years, 30 pack-year currently smoking OR have quit w/in 15years.) does not qualify.   Hepatitis C Screening: does not qualify.  Vision Screening: Recommended annual ophthalmology exams for early detection of glaucoma and other disorders of the eye.  Dental Screening: Recommended annual dental exams for proper oral hygiene  Community Resource Referral / Chronic Care Management: CRR  required this visit?  No   CCM required this visit?  No      Plan:     I have personally reviewed and noted the following in the patient's chart:   Medical and social history Use of alcohol, tobacco or illicit drugs  Current medications and supplements including opioid prescriptions. Patient is not currently taking opioid prescriptions. Functional ability and status Nutritional status Physical activity Advanced directives List of other physicians Hospitalizations, surgeries, and ER visits in previous 12 months Vitals Screenings to include cognitive, depression, and falls Referrals and appointments  In addition, I have reviewed and discussed with patient certain preventive protocols, quality metrics, and best practice recommendations. A written personalized care plan for preventive services as well as general preventive health recommendations were provided to patient.     Cathey Endow, LPN   03/12/2352

## 2022-10-29 NOTE — Patient Instructions (Addendum)
[name] , Thank you for taking time to come for your Medicare Wellness Visit. I appreciate your ongoing commitment to your health goals. Please review the following plan we discussed and let me know if I can assist you in the future.   These are the goals we discussed:  Goals       Patient Stated     DIET - INCREASE WATER INTAKE (pt-stated)      Recommend to drink at least 6-8 8oz glasses of water per day Drink less tea        This is a list of the screening recommended for you and due dates:  Health Maintenance  Topic Date Due   COVID-19 Vaccine (5 - 2023-24 season) 11/14/2022*   Zoster (Shingles) Vaccine (1 of 2) 01/28/2023*   Flu Shot  02/18/2023   Mammogram  10/22/2023   Medicare Annual Wellness Visit  10/29/2023   DTaP/Tdap/Td vaccine (2 - Td or Tdap) 02/06/2031   Pneumonia Vaccine  Completed   DEXA scan (bone density measurement)  Completed   HPV Vaccine  Aged Out  *Topic was postponed. The date shown is not the original due date.   Conditions/risks identified: none new.  Next appointment: Follow up in one year for your annual wellness visit    Preventive Care 65 Years and Older, Female Preventive care refers to lifestyle choices and visits with your health care provider that can promote health and wellness. What does preventive care include? A yearly physical exam. This is also called an annual well check. Dental exams once or twice a year. Routine eye exams. Ask your health care provider how often you should have your eyes checked. Personal lifestyle choices, including: Daily care of your teeth and gums. Regular physical activity. Eating a healthy diet. Avoiding tobacco and drug use. Limiting alcohol use. Practicing safe sex. Taking low-dose aspirin every day. Taking vitamin and mineral supplements as recommended by your health care provider. What happens during an annual well check? The services and screenings done by your health care provider during your  annual well check will depend on your age, overall health, lifestyle risk factors, and family history of disease. Counseling  Your health care provider may ask you questions about your: Alcohol use. Tobacco use. Drug use. Emotional well-being. Home and relationship well-being. Sexual activity. Eating habits. History of falls. Memory and ability to understand (cognition). Work and work Astronomer. Reproductive health. Screening  You may have the following tests or measurements: Height, weight, and BMI. Blood pressure. Lipid and cholesterol levels. These may be checked every 5 years, or more frequently if you are over 27 years old. Skin check. Lung cancer screening. You may have this screening every year starting at age 77 if you have a 30-pack-year history of smoking and currently smoke or have quit within the past 15 years. Fecal occult blood test (FOBT) of the stool. You may have this test every year starting at age 59. Flexible sigmoidoscopy or colonoscopy. You may have a sigmoidoscopy every 5 years or a colonoscopy every 10 years starting at age 44. Hepatitis C blood test. Hepatitis B blood test. Sexually transmitted disease (STD) testing. Diabetes screening. This is done by checking your blood sugar (glucose) after you have not eaten for a while (fasting). You may have this done every 1-3 years. Bone density scan. This is done to screen for osteoporosis. You may have this done starting at age 76. Mammogram. This may be done every 1-2 years. Talk to your health  care provider about how often you should have regular mammograms. Talk with your health care provider about your test results, treatment options, and if necessary, the need for more tests. Vaccines  Your health care provider may recommend certain vaccines, such as: Influenza vaccine. This is recommended every year. Tetanus, diphtheria, and acellular pertussis (Tdap, Td) vaccine. You may need a Td booster every 10  years. Zoster vaccine. You may need this after age 51. Pneumococcal 13-valent conjugate (PCV13) vaccine. One dose is recommended after age 67. Pneumococcal polysaccharide (PPSV23) vaccine. One dose is recommended after age 56. Talk to your health care provider about which screenings and vaccines you need and how often you need them. This information is not intended to replace advice given to you by your health care provider. Make sure you discuss any questions you have with your health care provider. Document Released: 08/02/2015 Document Revised: 03/25/2016 Document Reviewed: 05/07/2015 Elsevier Interactive Patient Education  2017 [place] Prevention in the Home Falls can cause injuries. They can happen to people of all ages. There are many things you can do to make your home safe and to help prevent falls. What can I do on the outside of my home? Regularly fix the edges of walkways and driveways and fix any cracks. Remove anything that might make you trip as you walk through a door, such as a raised step or threshold. Trim any bushes or trees on the path to your home. Use bright outdoor lighting. Clear any walking paths of anything that might make someone trip, such as rocks or tools. Regularly check to see if handrails are loose or broken. Make sure that both sides of any steps have handrails. Any raised decks and porches should have guardrails on the edges. Have any leaves, snow, or ice cleared regularly. Use sand or salt on walking paths during winter. Clean up any spills in your garage right away. This includes oil or grease spills. What can I do in the bathroom? Use night lights. Install grab bars by the toilet and in the tub and shower. Do not use towel bars as grab bars. Use non-skid mats or decals in the tub or shower. If you need to sit down in the shower, use a plastic, non-slip stool. Keep the floor dry. Clean up any water that spills on the floor as soon as it  happens. Remove soap buildup in the tub or shower regularly. Attach bath mats securely with double-sided non-slip rug tape. Do not have throw rugs and other things on the floor that can make you trip. What can I do in the bedroom? Use night lights. Make sure that you have a light by your bed that is easy to reach. Do not use any sheets or blankets that are too big for your bed. They should not hang down onto the floor. Have a firm chair that has side arms. You can use this for support while you get dressed. Do not have throw rugs and other things on the floor that can make you trip. What can I do in the kitchen? Clean up any spills right away. Avoid walking on wet floors. Keep items that you use a lot in easy-to-reach places. If you need to reach something above you, use a strong step stool that has a grab bar. Keep electrical cords out of the way. Do not use floor polish or wax that makes floors slippery. If you must use wax, use non-skid floor wax. Do not have throw  rugs and other things on the floor that can make you trip. What can I do with my stairs? Do not leave any items on the stairs. Make sure that there are handrails on both sides of the stairs and use them. Fix handrails that are broken or loose. Make sure that handrails are as long as the stairways. Check any carpeting to make sure that it is firmly attached to the stairs. Fix any carpet that is loose or worn. Avoid having throw rugs at the top or bottom of the stairs. If you do have throw rugs, attach them to the floor with carpet tape. Make sure that you have a light switch at the top of the stairs and the bottom of the stairs. If you do not have them, ask someone to add them for you. What else can I do to help prevent falls? Wear shoes that: Do not have high heels. Have rubber bottoms. Are comfortable and fit you well. Are closed at the toe. Do not wear sandals. If you use a stepladder: Make sure that it is fully opened.  Do not climb a closed stepladder. Make sure that both sides of the stepladder are locked into place. Ask someone to hold it for you, if possible. Clearly mark and make sure that you can see: Any grab bars or handrails. First and last steps. Where the edge of each step is. Use tools that help you move around (mobility aids) if they are needed. These include: Canes. Walkers. Scooters. Crutches. Turn on the lights when you go into a dark area. Replace any light bulbs as soon as they burn out. Set up your furniture so you have a clear path. Avoid moving your furniture around. If any of your floors are uneven, fix them. If there are any pets around you, be aware of where they are. Review your medicines with your doctor. Some medicines can make you feel dizzy. This can increase your chance of falling. Ask your doctor what other things that you can do to help prevent falls. This information is not intended to replace advice given to you by your health care provider. Make sure you discuss any questions you have with your health care provider. Document Released: 05/02/2009 Document Revised: 12/12/2015 Document Reviewed: 08/10/2014 Elsevier Interactive Patient Education  2017 [employer].

## 2022-11-08 NOTE — Patient Instructions (Incomplete)
It was a pleasure meeting you today. Thank you for allowing me to take part in your health care.  Our goals for today as we discussed include:  Your blood pressure continue current medications Nebivolol 2.5 mg daily Losartan 25 mg daily Call your pharmacy for refills of the above medication.   Call pharmacy for refills on Flonase.  Checking blood work today  Recommend Shingles vaccine.  This is a 2 dose series and can be given at your local pharmacy.  Please talk to your pharmacist about this.   Follow-up with kidney doctor on April 12, 2023 as scheduled  Please arrive 15 minutes prior to your appointment.  Arrivals 5 minutes past your appointment time will need to be rescheduled.  This is to ensure that all patients are seen in a timely manner.  Thank you for your understanding and cooperation.   If you have any questions or concerns, please do not hesitate to call the office at [phone].  I look forward to our next visit and until then take care and stay safe.  Regards,   Dana Allan, MD   [facility]

## 2022-11-08 NOTE — Progress Notes (Unsigned)
   SUBJECTIVE:   No chief complaint on file.  HPI Patient presents to clinic for follow up Chronic disease management  Hypertension Asymptomatic.  Currently takes nebivolol 2.5 mg daily.  Not sure if taking losartan 25 mg daily.  Hyperlipidemia Taking Crestor 20 mg daily.  Tolerating medication well.  Anemia Asymptomatic.  Takes ferrous sulfate 3 times per week.  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  Follows with general surgery and oncology.   PERTINENT PMH / PSH: CKD stage IIIb Hyperlipidemia Bilateral total mastectomy Hypertension  OBJECTIVE:  There were no vitals taken for this visit.   Physical Exam  ASSESSMENT/PLAN:  Coronary artery disease involving native coronary artery of native heart without angina pectoris  Hypertension, unspecified type  Gastroesophageal reflux disease  Hyperlipidemia, unspecified hyperlipidemia type  Non-seasonal allergic rhinitis, unspecified trigger  HCM Recommend shingles vaccine Recommend RSV vaccine Mammogram up to date. Due 2025  PDMP reviewed  No follow-ups on file.  Dana Allan, MD

## 2022-11-08 NOTE — Assessment & Plan Note (Signed)
Continue Crestor 20 mg daily. 

## 2022-11-09 ENCOUNTER — Encounter: Payer: Self-pay | Admitting: Family Medicine

## 2022-11-09 ENCOUNTER — Ambulatory Visit (INDEPENDENT_AMBULATORY_CARE_PROVIDER_SITE_OTHER): Payer: 59 | Admitting: Family Medicine

## 2022-11-09 VITALS — BP 134/72 | HR 54 | Temp 97.9°F | Resp 16 | Ht 66.0 in | Wt 179.5 lb

## 2022-11-09 DIAGNOSIS — E785 Hyperlipidemia, unspecified: Secondary | ICD-10-CM | POA: Diagnosis not present

## 2022-11-09 DIAGNOSIS — I10 Essential (primary) hypertension: Secondary | ICD-10-CM

## 2022-11-09 DIAGNOSIS — I700 Atherosclerosis of aorta: Secondary | ICD-10-CM

## 2022-11-09 DIAGNOSIS — K219 Gastro-esophageal reflux disease without esophagitis: Secondary | ICD-10-CM

## 2022-11-09 DIAGNOSIS — N1832 Chronic kidney disease, stage 3b: Secondary | ICD-10-CM | POA: Diagnosis not present

## 2022-11-09 DIAGNOSIS — I2510 Atherosclerotic heart disease of native coronary artery without angina pectoris: Secondary | ICD-10-CM

## 2022-11-09 DIAGNOSIS — D649 Anemia, unspecified: Secondary | ICD-10-CM

## 2022-11-09 DIAGNOSIS — J3089 Other allergic rhinitis: Secondary | ICD-10-CM

## 2022-11-09 DIAGNOSIS — E669 Obesity, unspecified: Secondary | ICD-10-CM

## 2022-11-09 DIAGNOSIS — E66811 Obesity, class 1: Secondary | ICD-10-CM

## 2022-11-09 DIAGNOSIS — E663 Overweight: Secondary | ICD-10-CM

## 2022-11-09 LAB — BASIC METABOLIC PANEL
BUN: 23 mg/dL (ref 6–23)
CO2: 23 mEq/L (ref 19–32)
Calcium: 9.5 mg/dL (ref 8.4–10.5)
Chloride: 111 mEq/L (ref 96–112)
Creatinine, Ser: 1.3 mg/dL — ABNORMAL HIGH (ref 0.40–1.20)
GFR: 38.07 mL/min — ABNORMAL LOW (ref >60.00)
Glucose, Bld: 81 mg/dL (ref 70–99)
Potassium: 4.4 mEq/L (ref 3.5–5.1)
Sodium: 141 mEq/L (ref 135–145)

## 2022-11-09 MED ORDER — SALINE SPRAY 0.65 % NA SOLN: 1.0000 | NASAL | 11 refills | Status: AC | PRN

## 2022-11-09 MED ORDER — ROSUVASTATIN CALCIUM 20 MG PO TABS: 20.0000 mg | ORAL_TABLET | Freq: Every day | ORAL | 3 refills | Status: DC

## 2022-11-09 MED ORDER — PANTOPRAZOLE SODIUM 40 MG PO TBEC: 40.0000 mg | DELAYED_RELEASE_TABLET | Freq: Every day | ORAL | 3 refills | Status: DC

## 2022-11-09 NOTE — Assessment & Plan Note (Signed)
Chronic.  Stable. Refill Protonix. 

## 2022-11-09 NOTE — Assessment & Plan Note (Signed)
Chronic.   Refill Crestor 20 mg daily Check fasting lipids at next visit

## 2022-11-09 NOTE — Assessment & Plan Note (Addendum)
Chronic.  Stable.  At goal per JNC 8 guidelines for age less than 140/90.   Continue Bystolic 2.5 mg daily Continue losartan 25 mg daily C-Met today Follow-up with nephrology as scheduled

## 2022-11-09 NOTE — Assessment & Plan Note (Signed)
Chronic.  Stable.  Blood pressure well-controlled on current medication Continue losartan 25 mg daily Continue nebivolol 2.5 mg daily Follows with nephrology. Avoid nephrotoxic agents.

## 2022-11-25 ENCOUNTER — Ambulatory Visit: Payer: Self-pay

## 2022-11-25 NOTE — Patient Outreach (Signed)
  Care Coordination   Initial Visit Note   11/25/2022 Name: Jasie Fucci MRN: [id number] DOB: Mar 17, 1941  Vallon Kusel is a 82 y.o. year old female who sees Dana Allan, MD for primary care. I spoke with  Trudie Buckler by phone today.  What matters to the patients health and wellness today?  CM provides education on [facility] services.  Patient feels that she has a good handle on her healthcare needs and agreed to let her doctor know if anything changes.  Patient declines [facility] services at this time.    Goals Addressed   None     SDOH assessments and interventions completed:  No     Care Coordination Interventions:  No, not indicated   Follow up plan: No further intervention required.   Encounter Outcome:  Pt. Refused

## 2022-12-30 ENCOUNTER — Emergency Department: Payer: 59

## 2022-12-30 ENCOUNTER — Other Ambulatory Visit: Payer: Self-pay

## 2022-12-30 ENCOUNTER — Encounter: Payer: Self-pay | Admitting: Emergency Medicine

## 2022-12-30 ENCOUNTER — Emergency Department
Admission: EM | Admit: 2022-12-30 | Discharge: 2022-12-30 | Disposition: A | Discharge status: Home / Self Care | Payer: 59 | Attending: Emergency Medicine | Admitting: Emergency Medicine

## 2022-12-30 DIAGNOSIS — I129 Hypertensive chronic kidney disease with stage 1 through stage 4 chronic kidney disease, or unspecified chronic kidney disease: Secondary | ICD-10-CM | POA: Diagnosis not present

## 2022-12-30 DIAGNOSIS — Z91040 Latex allergy status: Secondary | ICD-10-CM | POA: Insufficient documentation

## 2022-12-30 DIAGNOSIS — N390 Urinary tract infection, site not specified: Secondary | ICD-10-CM | POA: Diagnosis not present

## 2022-12-30 DIAGNOSIS — Z7951 Long term (current) use of inhaled steroids: Secondary | ICD-10-CM | POA: Diagnosis not present

## 2022-12-30 DIAGNOSIS — Z7982 Long term (current) use of aspirin: Secondary | ICD-10-CM | POA: Insufficient documentation

## 2022-12-30 DIAGNOSIS — J449 Chronic obstructive pulmonary disease, unspecified: Secondary | ICD-10-CM | POA: Insufficient documentation

## 2022-12-30 DIAGNOSIS — Z853 Personal history of malignant neoplasm of breast: Secondary | ICD-10-CM | POA: Diagnosis not present

## 2022-12-30 DIAGNOSIS — R531 Weakness: Secondary | ICD-10-CM | POA: Insufficient documentation

## 2022-12-30 DIAGNOSIS — N1832 Chronic kidney disease, stage 3b: Secondary | ICD-10-CM | POA: Insufficient documentation

## 2022-12-30 DIAGNOSIS — R002 Palpitations: Secondary | ICD-10-CM | POA: Insufficient documentation

## 2022-12-30 DIAGNOSIS — R001 Bradycardia, unspecified: Secondary | ICD-10-CM | POA: Insufficient documentation

## 2022-12-30 DIAGNOSIS — Z79899 Other long term (current) drug therapy: Secondary | ICD-10-CM | POA: Insufficient documentation

## 2022-12-30 LAB — COMPREHENSIVE METABOLIC PANEL
ALT: 21 U/L (ref 0–44)
AST: 29 U/L (ref 15–41)
Albumin: 3.6 g/dL (ref 3.5–5.0)
Alkaline Phosphatase: 80 U/L (ref 38–126)
Anion gap: 8 (ref 5–15)
BUN: 21 mg/dL (ref 8–23)
CO2: 19 mmol/L — ABNORMAL LOW (ref 22–32)
Calcium: 8.8 mg/dL — ABNORMAL LOW (ref 8.9–10.3)
Chloride: 114 mmol/L — ABNORMAL HIGH (ref 98–111)
Creatinine, Ser: 1.22 mg/dL — ABNORMAL HIGH (ref 0.44–1.00)
GFR, Estimated: 44 mL/min — ABNORMAL LOW (ref >60)
Glucose, Bld: 87 mg/dL (ref 70–99)
Potassium: 3.9 mmol/L (ref 3.5–5.1)
Sodium: 141 mmol/L (ref 135–145)
Total Bilirubin: 0.5 mg/dL (ref 0.3–1.2)
Total Protein: 6.8 g/dL (ref 6.5–8.1)

## 2022-12-30 LAB — URINALYSIS, ROUTINE W REFLEX MICROSCOPIC
Bilirubin Urine: NEGATIVE
Glucose, UA: NEGATIVE mg/dL
Hgb urine dipstick: NEGATIVE
Ketones, ur: NEGATIVE mg/dL
Nitrite: NEGATIVE
Protein, ur: NEGATIVE mg/dL
Specific Gravity, Urine: 1.02 (ref 1.005–1.030)
WBC, UA: >50 WBC/hpf (ref 0–5)
pH: 5 (ref 5.0–8.0)

## 2022-12-30 LAB — CBC
HCT: 35 % — ABNORMAL LOW (ref 36.0–46.0)
Hemoglobin: 11.4 g/dL — ABNORMAL LOW (ref 12.0–15.0)
MCH: 30.2 pg (ref 26.0–34.0)
MCHC: 32.6 g/dL (ref 30.0–36.0)
MCV: 92.8 fL (ref 80.0–100.0)
Platelets: 188 10*3/uL (ref 150–400)
RBC: 3.77 MIL/uL — ABNORMAL LOW (ref 3.87–5.11)
RDW: 15.4 % (ref 11.5–15.5)
WBC: 6.9 10*3/uL (ref 4.0–10.5)
nRBC: 0 % (ref 0.0–0.2)

## 2022-12-30 LAB — TROPONIN I (HIGH SENSITIVITY)
Troponin I (High Sensitivity): 8 ng/L (ref <18)
Troponin I (High Sensitivity): 8 ng/L (ref <18)

## 2022-12-30 LAB — TSH: TSH: 2.876 u[IU]/mL (ref 0.350–4.500)

## 2022-12-30 LAB — T4, FREE: Free T4: 0.74 ng/dL (ref 0.61–1.12)

## 2022-12-30 MED ORDER — CEPHALEXIN 500 MG PO CAPS
500.0000 mg | ORAL_CAPSULE | Freq: Once | ORAL | Status: AC
  Administered 2022-12-30: 500 mg via ORAL
  Filled 2022-12-30: qty 1

## 2022-12-30 MED ORDER — CEPHALEXIN 500 MG PO CAPS: 500.0000 mg | ORAL_CAPSULE | Freq: Three times a day (TID) | ORAL | 0 refills | Status: DC

## 2022-12-30 NOTE — ED Provider Notes (Signed)
[facility] Provider Note    Event Date/Time   First MD Initiated Contact with Patient 12/30/22 [phone]     (approximate)   History   Tachycardia and Weakness   HPI  Maria Patterson is a 82 y.o. female who presents to the ED from home with a chief complaint of palpitations and generalized weakness.  Patient reports a 1 week history of frequent; presents tonight because she wants to get it checked out.  Denies skipping beats.  Complains of sinus pressure/congestion.  Denies history of atrial fibrillation or anticoagulant use.  Denies fever/chills, chest pain, shortness of breath, abdominal pain, nausea, vomiting or dizziness.     Past Medical History   Past Medical History:  Diagnosis Date   Ache in joint 03/09/2016   Anemia    Anxiety and depression 05/12/2019   Breast cancer (HCC) 2018   Left   Breast mass, left 09/22/2016   RECOMMENDATION: Ultrasound-guided core biopsies of masses at left breast 2 o'clock 2 cm from nipple, left breast 2 o'clock 5 cm from nipple, abnormal left axillary lymph nodes.   Collapsed vertebra, not elsewhere classified, lumbar region, initial encounter for fracture (HCC) 05/04/2017   Formatting of this note might be different from the original.  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   History of breast cancer 02/04/2021   History of chickenpox    Hx of adenomatous colonic polyps    Hyperglycemia    Hyperlipidemia    Hypomagnesemia 05/04/2017   Insomnia 09/26/2017   Multiple renal cysts 05/15/2021   Obesity    Personal history of chemotherapy 2018   Left  breast   Personal history of radiation therapy 2018   Postmenopausal    Postmenopausal 09/18/2016   Shingles    right eye    Spider vein of lower extremity 12/20/2020   Tubular adenoma 01/18/2020     Active Problem List   Patient Active Problem List   Diagnosis Date Noted   Vitamin D deficiency 07/24/2022   Iron deficiency 07/24/2022   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   Cervical arthritis 02/04/2021   Lymphedema of arm 09/18/2020   Chronic right shoulder pain 09/18/2020   Osteoporosis 05/12/2019   Pre-diabetes 02/19/2019   H/O total mastectomy of left breast 06/03/2018   Obesity (BMI 30.0-34.9) 03/08/2018   Stage 3b chronic kidney disease (HCC) 08/09/2017   Aortic atherosclerosis (HCC) 05/04/2017   Degenerative disc disease, lumbar 05/04/2017   Anemia 02/01/2017   Atherosclerosis of native arteries of extremity with intermittent claudication (HCC) 11/09/2016   Hypertension 11/07/2016   Allergic rhinitis 11/07/2016   Malignant neoplasm of upper-outer quadrant of left female breast (HCC) 10/08/2016   Gastroesophageal reflux disease 07/01/2012   Hyperlipidemia 07/01/2012     Past Surgical History   Past Surgical History:  Procedure Laterality Date   ABDOMINAL HYSTERECTOMY     AXILLARY SENTINEL NODE BIOPSY Left 04/14/2017   Procedure: AXILLARY SENTINEL NODE BIOPSY;  Surgeon: Henrene Dodge, MD;  Location: [facility] ORS;  Service: General;  Laterality: Left;   BREAST BIOPSY Left 09/30/2016   US biopsy  of 3 areas + chemo   BREAST EXCISIONAL BIOPSY Left 04/14/2017   Left breast invasive cancer left mastectomy   COLONOSCOPY WITH PROPOFOL N/A 06/17/2020   Procedure: COLONOSCOPY WITH PROPOFOL;  Surgeon: Toledo, Boykin Nearing, MD;  Location: [facility] ENDOSCOPY;  Service: Gastroenterology;  Laterality: N/A;   ESOPHAGOGASTRODUODENOSCOPY (EGD) WITH PROPOFOL N/A 12/24/2014   Procedure: ESOPHAGOGASTRODUODENOSCOPY (EGD) WITH PROPOFOL;  Surgeon: Scot Jun, MD;  Location: [facility]  ENDOSCOPY;  Service: Endoscopy;  Laterality: N/A;   MASTECTOMY Left 04/14/2017   Left breast invasive cancer   PORTACATH PLACEMENT Right 10/27/2016   Procedure: INSERTION PORT-A-CATH;  Surgeon: Hulda Marin, MD;  Location: [facility] ORS;  Service: General;  Laterality: Right;   TONSILECTOMY, ADENOIDECTOMY, BILATERAL MYRINGOTOMY AND TUBES     TONSILLECTOMY     TOTAL MASTECTOMY Left 04/14/2017   Procedure: TOTAL MASTECTOMY;  Surgeon: Henrene Dodge, MD;  Location: [facility] ORS;  Service: General;  Laterality: Left;     Home Medications   Prior to Admission medications   Medication Sig Start Date End Date Taking? Authorizing Provider  cephALEXin (KEFLEX) 500 MG capsule Take 1 capsule (500 mg total) by mouth 3 (three) times daily. 12/30/22  Yes Irean Hong, MD  acetaminophen (TYLENOL) 325 MG tablet Take 2 tablets (650 mg total) by mouth every 6 (six) hours as needed. 01/15/18   Sharman Cheek, MD  aspirin 81 MG tablet Take 81 mg by mouth. Takes every 3-4 days.    [provider]  Cholecalciferol (VITAMIN D) 2000 units tablet Take 2,000 Units by mouth 3 (three) times a week.  09/01/16   Kerman Passey, MD  ferrous sulfate 325 (65 FE) MG EC tablet Take 325 mg by mouth 3 (three) times a week.  09/01/16   Kerman Passey, MD  fluticasone (FLONASE) 50 MCG/ACT nasal spray Place 2 sprays into both nostrils daily as needed for allergies or rhinitis. 07/08/22   Dana Allan, MD  losartan (COZAAR) 25 MG tablet Take 25 mg by mouth daily. 12/21/21   [provider]  Multiple Vitamin (MULTIVITAMIN WITH MINERALS) TABS tablet Take 1 tablet by mouth 3 (three) times a week.  09/01/16   Lada, Janit Bern, MD  nebivolol (BYSTOLIC) 2.5 MG tablet Take 1 tablet (2.5 mg total) by mouth daily. 03/25/22   McLean-Scocuzza, Pasty Spillers, MD  pantoprazole (PROTONIX) 40 MG tablet Take 1 tablet (40 mg total) by mouth daily. 30 minutes before food 11/09/22   Dana Allan, MD  Polyethyl Glycol-Propyl Glycol (SYSTANE OP) Place 1  drop into both eyes daily as needed (dry eyes).     [provider]  rosuvastatin (CRESTOR) 20 MG tablet Take 1 tablet (20 mg total) by mouth daily. D/c lipitor 40 11/09/22   Dana Allan, MD  sodium chloride (OCEAN) 0.65 % SOLN nasal spray Place 1 spray into both nostrils as needed for congestion. 11/09/22   Dana Allan, MD     Allergies  Latex and Penicillins   Family History   Family History  Problem Relation Age of Onset   Stroke Mother    Stroke Father    Cancer Sister        with mets as of 09/18/20   Cancer Daughter        as of 05/06/21 dx ? cancer type 58   Depression Neg Hx    Breast cancer Neg Hx      Physical Exam  Triage Vital Signs: ED Triage Vitals  Enc Vitals Group     BP 12/30/22  0258 (!) 176/79     Pulse Rate 12/30/22 0258 75     Resp 12/30/22 0258 20     Temp 12/30/22 0258 98.2 F (36.8 C)     Temp Source 12/30/22 0258 Oral     SpO2 12/30/22 0258 98 %     Weight 12/30/22 0259 176 lb (79.8 kg)     Height 12/30/22 0259 5\' 1"  (1.549 m)     Head Circumference --      Peak Flow --      Pain Score 12/30/22 0258 0     Pain Loc --      Pain Edu? --      Excl. in GC? --     Updated Vital Signs: BP (!) 176/79 (BP Location: Right Arm)   Pulse 75   Temp 98.2 F (36.8 C) (Oral)   Resp 20   Ht 5\' 1"  (1.549 m)   Wt 79.8 kg   SpO2 98%   BMI 33.25 kg/m    General: Awake, no distress.  CV:  RRR.  Good peripheral perfusion.  Resp:  Normal effort.  CTAB. Abd:  Nontender.  No distention.  Other:  No thyromegaly.  Bilateral calves are supple and nontender.   ED Results / Procedures / Treatments  Labs (all labs ordered are listed, but only abnormal results are displayed) Labs Reviewed  CBC - Abnormal; Notable for the following components:      Result Value   RBC 3.77 (*)    Hemoglobin 11.4 (*)    HCT 35.0 (*)    All other components within normal limits  COMPREHENSIVE METABOLIC PANEL - Abnormal; Notable for the following components:    Chloride 114 (*)    CO2 19 (*)    Creatinine, Ser 1.22 (*)    Calcium 8.8 (*)    GFR, Estimated 44 (*)    All other components within normal limits  URINALYSIS, ROUTINE W REFLEX MICROSCOPIC - Abnormal; Notable for the following components:   Color, Urine YELLOW (*)    APPearance HAZY (*)    Leukocytes,Ua LARGE (*)    Bacteria, UA RARE (*)    All other components within normal limits  T4, FREE  TSH  TROPONIN I (HIGH SENSITIVITY)  TROPONIN I (HIGH SENSITIVITY)     EKG  ED ECG REPORT I, September Mormile J, the attending physician, personally viewed and interpreted this ECG.   Date: 12/30/2022  EKG Time: 0259  Rate: 58  Rhythm: sinus bradycardia  Axis: Normal  Intervals:none  ST&T Change: Nonspecific    RADIOLOGY I have independently visualized and interpreted patient's x-ray as well as noted the radiology interpretation:  X-ray: No acute cardiopulmonary process  Official radiology report(s): DG Chest 1 View  Result Date: 12/30/2022 CLINICAL DATA:  82 year old female with palpitations, weakness. EXAM: CHEST  1 VIEW COMPARISON:  CTA chest 09/19/2018 and earlier. FINDINGS: Portable AP upright views at 0324 hours. Stable cardiomegaly and mediastinal contours. Visualized tracheal air column is within normal limits. Stable lung volumes. Allowing for portable technique the lungs are clear. No pneumothorax or pleural effusion. S-shaped thoracolumbar scoliosis is mild-to-moderate. No acute osseous abnormality identified. Negative visible bowel gas. IMPRESSION: Chronic cardiomegaly.  No acute cardiopulmonary abnormality. Electronically Signed   By: Odessa Fleming M.D.   On: 12/30/2022 03:56     PROCEDURES:  Critical Care performed: No  Procedures   MEDICATIONS ORDERED IN ED: Medications  cephALEXin (KEFLEX) capsule 500 mg (has no administration in time range)  IMPRESSION / MDM / ASSESSMENT AND PLAN / ED COURSE  I reviewed the triage vital signs and the nursing notes.                              82 year old female presenting with palpitations. Differential diagnosis includes, but is not limited to, ACS, aortic dissection, pulmonary embolism, cardiac tamponade, pneumothorax, pneumonia, pericarditis, myocarditis, GI-related causes including esophagitis/gastritis, and musculoskeletal chest wall pain.   I have personally reviewed patient's records and note a PCP office visit on 11/09/2022 for chronic medical maintenance.  Patient's presentation is most consistent with acute presentation with potential threat to life or bodily function.  Will obtain cardiac panel to include thyroid function, UA, chest x-ray.  Patient currently voicing no complaints at this time.  Will reassess.  Clinical Course as of 12/30/22 0540  Wed Dec 30, 2022  5366 Updated patient on all laboratory and imaging results.  Will treat UTI with Keflex and patient will follow-up closely with her PCP.  Strict return precautions given.  Patient verbalizes understanding and agrees with plan of care. [JS]    Clinical Course User Index [JS] Irean Hong, MD     FINAL CLINICAL IMPRESSION(S) / ED DIAGNOSES   Final diagnoses:  Palpitations  Weakness  Lower urinary tract infectious disease     Rx / DC Orders   ED Discharge Orders          Ordered    cephALEXin (KEFLEX) 500 MG capsule  3 times daily        12/30/22 0539             Note:  This document was prepared using Dragon voice recognition software and may include unintentional dictation errors.   Irean Hong, MD 12/30/22 [phone]

## 2022-12-30 NOTE — ED Triage Notes (Signed)
Pt arrived via POV with reports of feeling a rapid heart beat, gum pain and feeling like legs are weak, pt states sxs began yesterday. Pt states she woke up this morning and felt her heart beating rapidly. PT denies any hx of afib, pt has hx of HTN. Currently denies any chest pain

## 2022-12-30 NOTE — Discharge Instructions (Signed)
Take antibiotic as prescribed (Keflex 500mg  3 times daily x 7 days).  Drink plenty of fluids daily.  Return to the ER for worsening symptoms, persistent vomiting, difficulty breathing or other concerns.

## 2023-01-06 ENCOUNTER — Telehealth: Payer: Self-pay

## 2023-01-06 NOTE — Telephone Encounter (Signed)
Transition Care Management Unsuccessful Follow-up Telephone Call  Date of discharge and from where:  12/30/2022 [facility]  Attempts:  1st Attempt  Reason for unsuccessful TCM follow-up call:  No answer/busy  Tawan Corkern [facility]  [facility] Population Health Community Resource Care Guide   [email or web address]  ?? [phone]   Website: [email or web address]  [email or web address]

## 2023-01-06 NOTE — Telephone Encounter (Signed)
Transition Care Management Follow-up Telephone Call Date of discharge and from where: 12/30/2022 [facility] How have you been since you were released from the hospital? Patient is feeling better Any questions or concerns? No  Items Reviewed: Did the pt receive and understand the discharge instructions provided? Yes  Medications obtained and verified? Yes  Other? No  Any new allergies since your discharge? No  Dietary orders reviewed? Yes Do you have support at home? Yes   Follow up appointments reviewed:  PCP Hospital f/u appt confirmed? No  Scheduled to see  on  @ . Specialist Hospital f/u appt confirmed? No  Scheduled to see  on  @ . Are transportation arrangements needed? No  If their condition worsens, is the pt aware to call PCP or go to the Emergency Dept.? Yes Was the patient provided with contact information for the PCP's office or ED? Yes Was to pt encouraged to call back with questions or concerns? Yes  [facility]  [facility] Population Health Community Resource Care Guide   [email or web address]  ?? [phone]   Website: [email or web address]  [email or web address]

## 2023-01-19 ENCOUNTER — Encounter: Payer: Self-pay | Admitting: Family Medicine

## 2023-01-19 ENCOUNTER — Ambulatory Visit (INDEPENDENT_AMBULATORY_CARE_PROVIDER_SITE_OTHER): Payer: 59 | Admitting: Family Medicine

## 2023-01-19 VITALS — BP 126/62 | HR 59 | Temp 97.8°F | Resp 16 | Ht 61.0 in | Wt 177.4 lb

## 2023-01-19 DIAGNOSIS — E785 Hyperlipidemia, unspecified: Secondary | ICD-10-CM

## 2023-01-19 DIAGNOSIS — B379 Candidiasis, unspecified: Secondary | ICD-10-CM | POA: Diagnosis not present

## 2023-01-19 DIAGNOSIS — N390 Urinary tract infection, site not specified: Secondary | ICD-10-CM | POA: Diagnosis not present

## 2023-01-19 LAB — POCT URINALYSIS DIPSTICK
Bilirubin, UA: NEGATIVE
Glucose, UA: NEGATIVE
Ketones, UA: NEGATIVE
Nitrite, UA: NEGATIVE
Protein, UA: NEGATIVE
Spec Grav, UA: 1.02 (ref 1.010–1.025)
Urobilinogen, UA: 0.2 E.U./dL
pH, UA: 5.5 (ref 5.0–8.0)

## 2023-01-19 MED ORDER — FLUCONAZOLE 150 MG PO TABS
150.0000 mg | ORAL_TABLET | Freq: Every day | ORAL | 0 refills | Status: DC
Start: 2023-01-19 — End: 2023-04-12

## 2023-01-19 NOTE — Progress Notes (Signed)
SUBJECTIVE:   Chief Complaint  Patient presents with   Hospitalization Follow-up   HPI Patient present to clinic for hospital follow up.  Was recently seen at [facility] for palpitations and weakness.  Was noted to have urinary infection and treated with Keflex.  Patient reports symptoms have resolved since completing treatment.  Denies any fevers, dysuria, abdominal pain, or hematuria.  Denies any chest pain, shortness of breath, or weakness.  Endorses having vaginal itching since starting antibiotics.  Denies any vaginal discharge.     PERTINENT PMH / PSH: HTN HLD Prediabetes  OBJECTIVE:  BP 126/62   Pulse (!) 59   Temp 97.8 F (36.6 C)   Resp 16   Ht 5\' 1"  (1.549 m)   Wt 177 lb 6 oz (80.5 kg)   SpO2 98%   BMI 33.51 kg/m    Physical Exam Vitals reviewed.  Constitutional:      General: She is not in acute distress.    Appearance: Normal appearance. She is normal weight. She is not ill-appearing, toxic-appearing or diaphoretic.  Eyes:     General:        Right eye: No discharge.        Left eye: No discharge.     Conjunctiva/sclera: Conjunctivae normal.  Cardiovascular:     Rate and Rhythm: Regular rhythm. Bradycardia present.     Pulses: Normal pulses.     Heart sounds: Normal heart sounds.  Pulmonary:     Effort: Pulmonary effort is normal.     Breath sounds: Normal breath sounds.  Musculoskeletal:        General: Normal range of motion.  Skin:    General: Skin is warm and dry.  Neurological:     General: No focal deficit present.     Mental Status: She is alert and oriented to person, place, and time. Mental status is at baseline.  Psychiatric:        Mood and Affect: Mood normal.        Behavior: Behavior normal.        Thought Content: Thought content normal.        Judgment: Judgment normal.    Declines pelvic exam    01/19/2023    1:38 PM 11/09/2022    8:05 AM 10/29/2022    1:36 PM 04/10/2022    2:22 PM 03/25/2022    9:08 AM  Depression screen PHQ 2/9   Decreased Interest 0 0 0 0 0  Down, Depressed, Hopeless 0 0 0 0 0  PHQ - 2 Score 0 0 0 0 0  Altered sleeping 0 0     Tired, decreased energy 0 0     Change in appetite 0 0     Feeling bad or failure about yourself  0 0     Trouble concentrating 0 0     Moving slowly or fidgety/restless 0 0     Suicidal thoughts 0 0     PHQ-9 Score 0 0     Difficult doing work/chores Not difficult at all Not difficult at all         01/19/2023    1:38 PM 11/09/2022    8:05 AM 01/18/2020    8:37 AM 10/17/2019    8:12 AM  GAD 7 : Generalized Anxiety Score  Nervous, Anxious, on Edge 0 0 0 0  Control/stop worrying 0 0 0 0  Worry too much - different things 0 0 0 0  Trouble relaxing 0 0 0 0  Restless 0 0 0 0  Easily annoyed or irritable 0 0 0 0  Afraid - awful might happen 0 0 0 0  Total GAD 7 Score 0 0 0 0  Anxiety Difficulty Not difficult at all Not difficult at all Not difficult at all Not difficult at all    ASSESSMENT/PLAN:  Urinary tract infection without hematuria, site unspecified Assessment & Plan: Recently treated with Keflex. Symptoms resolved POC urine positive for large amount of leukocytes Check urine culture  Orders: -     POCT urinalysis dipstick -     Urine Culture  Candida infection Assessment & Plan: Vaginal itching likely secondary to recent antibiotic use Treat with Diflucan 150 mg x 1  Orders: -     Fluconazole; Take 1 tablet (150 mg total) by mouth daily.  Dispense: 1 tablet; Refill: 0  Hyperlipidemia, unspecified hyperlipidemia type Assessment & Plan: Chronic.   Continue Crestor 20 mg daily Check fasting lipids  Orders: -     Lipid panel   PDMP reviewed  Return if symptoms worsen or fail to improve, for PCP.  Dana Allan, MD

## 2023-01-19 NOTE — Patient Instructions (Addendum)
It was a pleasure meeting you today. Thank you for allowing me to take part in your health care.  Our goals for today as we discussed include:  Will check urine today  Take Diflucan 1 tablet for your yeast infection.  We will get some labs today.  If they are abnormal or we need to do something about them, I will call you.  If they are normal, I will send you a message on MyChart (if it is active) or a letter in the mail.  If you don't hear from [place] in 2 weeks, please call the office at the number below.   Follow up if no improvement.     If you have any questions or concerns, please do not hesitate to call the office at [phone].  I look forward to our next visit and until then take care and stay safe.  Regards,   Dana Allan, MD   [facility]

## 2023-01-20 LAB — LIPID PANEL
Cholesterol: 241 mg/dL — ABNORMAL HIGH (ref 0–200)
HDL: 64.2 mg/dL (ref >39.00)
LDL Cholesterol: 156 mg/dL — ABNORMAL HIGH (ref 0–99)
NonHDL: 176.7
Total CHOL/HDL Ratio: 4
Triglycerides: 106 mg/dL (ref 0.0–149.0)
VLDL: 21.2 mg/dL (ref 0.0–40.0)

## 2023-01-21 LAB — URINE CULTURE
MICRO NUMBER:: 15153181
Result:: NO GROWTH
SPECIMEN QUALITY:: ADEQUATE

## 2023-02-17 ENCOUNTER — Encounter: Payer: Self-pay | Admitting: Family Medicine

## 2023-02-17 DIAGNOSIS — B379 Candidiasis, unspecified: Secondary | ICD-10-CM | POA: Insufficient documentation

## 2023-02-17 DIAGNOSIS — N390 Urinary tract infection, site not specified: Secondary | ICD-10-CM | POA: Insufficient documentation

## 2023-02-17 NOTE — Assessment & Plan Note (Signed)
Vaginal itching likely secondary to recent antibiotic use Treat with Diflucan 150 mg x 1

## 2023-02-17 NOTE — Assessment & Plan Note (Signed)
Chronic.   Continue Crestor 20 mg daily Check fasting lipids

## 2023-02-17 NOTE — Assessment & Plan Note (Signed)
Recently treated with Keflex. Symptoms resolved POC urine positive for large amount of leukocytes Check urine culture

## 2023-03-04 ENCOUNTER — Encounter (INDEPENDENT_AMBULATORY_CARE_PROVIDER_SITE_OTHER): Payer: Self-pay

## 2023-04-05 ENCOUNTER — Other Ambulatory Visit (INDEPENDENT_AMBULATORY_CARE_PROVIDER_SITE_OTHER): Payer: 59

## 2023-04-05 ENCOUNTER — Encounter: Payer: Self-pay | Admitting: Family Medicine

## 2023-04-05 DIAGNOSIS — E785 Hyperlipidemia, unspecified: Secondary | ICD-10-CM | POA: Diagnosis not present

## 2023-04-05 DIAGNOSIS — E559 Vitamin D deficiency, unspecified: Secondary | ICD-10-CM

## 2023-04-05 DIAGNOSIS — R7303 Prediabetes: Secondary | ICD-10-CM

## 2023-04-05 DIAGNOSIS — I700 Atherosclerosis of aorta: Secondary | ICD-10-CM

## 2023-04-05 DIAGNOSIS — E669 Obesity, unspecified: Secondary | ICD-10-CM

## 2023-04-05 DIAGNOSIS — E611 Iron deficiency: Secondary | ICD-10-CM

## 2023-04-05 DIAGNOSIS — N1832 Chronic kidney disease, stage 3b: Secondary | ICD-10-CM

## 2023-04-05 LAB — HEMOGLOBIN A1C: Hgb A1c MFr Bld: 5.6 % (ref 4.6–6.5)

## 2023-04-05 LAB — CBC WITH DIFFERENTIAL/PLATELET
Basophils Absolute: 0.1 10*3/uL (ref 0.0–0.1)
Basophils Relative: 1.4 % (ref 0.0–3.0)
Eosinophils Absolute: 0.3 10*3/uL (ref 0.0–0.7)
Eosinophils Relative: 5.4 % — ABNORMAL HIGH (ref 0.0–5.0)
HCT: 37.8 % (ref 36.0–46.0)
Hemoglobin: 12.2 g/dL (ref 12.0–15.0)
Lymphocytes Relative: 40.9 % (ref 12.0–46.0)
Lymphs Abs: 2.3 10*3/uL (ref 0.7–4.0)
MCHC: 32.2 g/dL (ref 30.0–36.0)
MCV: 93.3 fl (ref 78.0–100.0)
Monocytes Absolute: 0.5 10*3/uL (ref 0.1–1.0)
Monocytes Relative: 8.5 % (ref 3.0–12.0)
Neutro Abs: 2.4 10*3/uL (ref 1.4–7.7)
Neutrophils Relative %: 43.8 % (ref 43.0–77.0)
Platelets: 192 10*3/uL (ref 150.0–400.0)
RBC: 4.06 Mil/uL (ref 3.87–5.11)
RDW: 15.5 % (ref 11.5–15.5)
WBC: 5.5 10*3/uL (ref 4.0–10.5)

## 2023-04-05 LAB — LIPID PANEL
Cholesterol: 224 mg/dL — ABNORMAL HIGH (ref 0–200)
HDL: 68.1 mg/dL (ref >39.00)
LDL Cholesterol: 137 mg/dL — ABNORMAL HIGH (ref 0–99)
NonHDL: 155.65
Total CHOL/HDL Ratio: 3
Triglycerides: 91 mg/dL (ref 0.0–149.0)
VLDL: 18.2 mg/dL (ref 0.0–40.0)

## 2023-04-05 LAB — COMPREHENSIVE METABOLIC PANEL
ALT: 21 U/L (ref 0–35)
AST: 26 U/L (ref 0–37)
Albumin: 3.6 g/dL (ref 3.5–5.2)
Alkaline Phosphatase: 87 U/L (ref 39–117)
BUN: 21 mg/dL (ref 6–23)
CO2: 24 meq/L (ref 19–32)
Calcium: 9.3 mg/dL (ref 8.4–10.5)
Chloride: 111 meq/L (ref 96–112)
Creatinine, Ser: 1.27 mg/dL — ABNORMAL HIGH (ref 0.40–1.20)
GFR: 39.04 mL/min — ABNORMAL LOW (ref >60.00)
Glucose, Bld: 77 mg/dL (ref 70–99)
Potassium: 4.4 meq/L (ref 3.5–5.1)
Sodium: 143 meq/L (ref 135–145)
Total Bilirubin: 0.3 mg/dL (ref 0.2–1.2)
Total Protein: 6.2 g/dL (ref 6.0–8.3)

## 2023-04-05 LAB — VITAMIN B12: Vitamin B-12: 369 pg/mL (ref 211–911)

## 2023-04-05 LAB — VITAMIN D 25 HYDROXY (VIT D DEFICIENCY, FRACTURES): VITD: 39.5 ng/mL (ref 30.00–100.00)

## 2023-04-12 ENCOUNTER — Encounter: Payer: Self-pay | Admitting: Family Medicine

## 2023-04-12 ENCOUNTER — Ambulatory Visit: Payer: 59

## 2023-04-12 ENCOUNTER — Ambulatory Visit (INDEPENDENT_AMBULATORY_CARE_PROVIDER_SITE_OTHER): Payer: 59 | Admitting: Family Medicine

## 2023-04-12 VITALS — BP 124/64 | HR 57 | Temp 97.9°F | Resp 16 | Ht 61.0 in | Wt 172.5 lb

## 2023-04-12 DIAGNOSIS — M25511 Pain in right shoulder: Secondary | ICD-10-CM

## 2023-04-12 DIAGNOSIS — I10 Essential (primary) hypertension: Secondary | ICD-10-CM | POA: Diagnosis not present

## 2023-04-12 DIAGNOSIS — N1832 Chronic kidney disease, stage 3b: Secondary | ICD-10-CM | POA: Diagnosis not present

## 2023-04-12 DIAGNOSIS — Z0000 Encounter for general adult medical examination without abnormal findings: Secondary | ICD-10-CM | POA: Diagnosis not present

## 2023-04-12 DIAGNOSIS — I2510 Atherosclerotic heart disease of native coronary artery without angina pectoris: Secondary | ICD-10-CM

## 2023-04-12 DIAGNOSIS — M19011 Primary osteoarthritis, right shoulder: Secondary | ICD-10-CM | POA: Diagnosis not present

## 2023-04-12 DIAGNOSIS — E785 Hyperlipidemia, unspecified: Secondary | ICD-10-CM

## 2023-04-12 DIAGNOSIS — J3089 Other allergic rhinitis: Secondary | ICD-10-CM

## 2023-04-12 MED ORDER — LOSARTAN POTASSIUM 25 MG PO TABS: 25.0000 mg | ORAL_TABLET | Freq: Every day | ORAL | 3 refills | Status: DC

## 2023-04-12 MED ORDER — PREDNISONE 20 MG PO TABS
20.0000 mg | ORAL_TABLET | Freq: Every day | ORAL | 0 refills | Status: AC
Start: 2023-04-12 — End: 2023-04-17

## 2023-04-12 MED ORDER — BACLOFEN 10 MG PO TABS
5.0000 mg | ORAL_TABLET | Freq: Three times a day (TID) | ORAL | 0 refills | Status: DC
Start: 2023-04-12 — End: 2023-09-07

## 2023-04-12 MED ORDER — ROSUVASTATIN CALCIUM 20 MG PO TABS: 20.0000 mg | ORAL_TABLET | Freq: Every day | ORAL | 3 refills | Status: DC

## 2023-04-12 MED ORDER — NEBIVOLOL HCL 2.5 MG PO TABS: 2.5000 mg | ORAL_TABLET | Freq: Every day | ORAL | 3 refills | Status: DC

## 2023-04-12 MED ORDER — FLUTICASONE PROPIONATE 50 MCG/ACT NA SUSP
2.0000 | Freq: Every day | NASAL | 11 refills | Status: DC | PRN
Start: 2023-04-12 — End: 2023-09-07

## 2023-04-12 NOTE — Progress Notes (Unsigned)
SUBJECTIVE:   Chief Complaint  Patient presents with  . Annual Exam  . Arm Pain    Right arm X 1 week shoulder to the back   HPI Patient presents to clinic for follow up Chronic disease management  Concern today for right shoulder pain Symptoms started 1 week ago.  Does not recall any trauma, heavy lifting, repetitive motions.  Reports history of arthritis and thinks may be related.  Reports pain slightly worse.  Denies any neck pain, fevers, numbness, tingling, weakness of extremities.  No previous imaging   Hypertension Asymptomatic.  Currently takes nebivolol 2.5 mg daily and losartan 25 mg daily.  Denies any dizziness, headaches, visual changes, chest pain, shortness of breath or lower extremity edema.  Requesting refills  Hyperlipidemia Taking Crestor 20 mg daily.  Tolerating medication well.  Requesting refills on medications  CKD stage IIIb Follows with Dr. Thedore Mins, at [facility].  Has a scheduled follow-up appointment in September.  Currently on losartan and nebivolol.  Blood pressure well-controlled.     PERTINENT PMH / PSH: CKD stage IIIb Hyperlipidemia Bilateral total mastectomy Hypertension  OBJECTIVE:  BP 124/64   Pulse (!) 57   Temp 97.9 F (36.6 C)   Resp 16   Ht 5\' 1"  (1.549 m)   Wt 172 lb 8 oz (78.2 kg)   SpO2 99%   BMI 32.59 kg/m    Physical Exam Vitals reviewed.  Constitutional:      General: She is not in acute distress.    Appearance: Normal appearance. She is normal weight. She is not ill-appearing, toxic-appearing or diaphoretic.  Eyes:     General:        Right eye: No discharge.        Left eye: No discharge.     Conjunctiva/sclera: Conjunctivae normal.  Cardiovascular:     Rate and Rhythm: Normal rate and regular rhythm.     Heart sounds: Normal heart sounds.  Pulmonary:     Effort: Pulmonary effort is normal.     Breath sounds: Normal breath sounds.  Abdominal:     General: Bowel sounds are normal.  Musculoskeletal:         General: Normal range of motion.  Skin:    General: Skin is warm and dry.  Neurological:     General: No focal deficit present.     Mental Status: She is alert and oriented to person, place, and time. Mental status is at baseline.  Psychiatric:        Mood and Affect: Mood normal.        Behavior: Behavior normal.        Thought Content: Thought content normal.        Judgment: Judgment normal.    ASSESSMENT/PLAN:  Acute pain of right shoulder -     predniSONE; Take 1 tablet (20 mg total) by mouth daily with breakfast for 5 days.  Dispense: 5 tablet; Refill: 0 -     DG Shoulder Right  Hypertension, unspecified type Assessment & Plan: Chronic.  Stable.  At goal per JNC 8 guidelines for age less than 140/90.   Refill Bystolic 2.5 mg daily Refill losartan 25 mg daily      Orders: -     Losartan Potassium; Take 1 tablet (25 mg total) by mouth daily.  Dispense: 90 tablet; Refill: 3 -     Nebivolol HCl; Take 1 tablet (2.5 mg total) by mouth daily.  Dispense: 90 tablet; Refill: 3  Coronary artery  disease involving native coronary artery of native heart without angina pectoris -     Nebivolol HCl; Take 1 tablet (2.5 mg total) by mouth daily.  Dispense: 90 tablet; Refill: 3  Non-seasonal allergic rhinitis, unspecified trigger -     Fluticasone Propionate; Place 2 sprays into both nostrils daily as needed for allergies or rhinitis.  Dispense: 16 g; Refill: 11  Hyperlipidemia, unspecified hyperlipidemia type Assessment & Plan: Chronic.  Recent LDL improved since statin switched.   Refill Crestor 20 mg daily Recheck lipids at next visit, if remains elevated consider addition of Zetia   Orders: -     Rosuvastatin Calcium; Take 1 tablet (20 mg total) by mouth daily. D/c lipitor 40  Dispense: 90 tablet; Refill: 3  Other orders -     Baclofen; Take 0.5 tablets (5 mg total) by mouth 3 (three) times daily.  Dispense: 20 each; Refill: 0   HCM Recommend shingles vaccine Recommend  RSV vaccine Mammogram up to date. Due 2025  PDMP reviewed  Return if symptoms worsen or fail to improve, for PCP.  Dana Allan, MD

## 2023-04-12 NOTE — Patient Instructions (Addendum)
It was a pleasure meeting you today. Thank you for allowing me to take part in your health care.  Our goals for today as we discussed include:  Will get xray of right shoulder Start Prednisone 20 mg daily for 5 days Take Baclofen 5 mg at night  Refills sent for requested medications  This is a list of the screening recommended for you and due dates:  Health Maintenance  Topic Date Due   Zoster (Shingles) Vaccine (1 of 2) Never done   COVID-19 Vaccine (5 - 2023-24 season) 03/21/2023   Flu Shot  10/18/2023*   Mammogram  10/22/2023   Medicare Annual Wellness Visit  10/29/2023   DTaP/Tdap/Td vaccine (2 - Td or Tdap) 02/06/2031   Pneumonia Vaccine  Completed   DEXA scan (bone density measurement)  Completed   HPV Vaccine  Aged Out  *Topic was postponed. The date shown is not the original due date.     Follow up if no improvement  If you have any questions or concerns, please do not hesitate to call the office at [phone].  I look forward to our next visit and until then take care and stay safe.  Regards,   Dana Allan, MD   [facility]

## 2023-04-13 DIAGNOSIS — H5713 Ocular pain, bilateral: Secondary | ICD-10-CM | POA: Diagnosis not present

## 2023-04-13 DIAGNOSIS — M3501 Sicca syndrome with keratoconjunctivitis: Secondary | ICD-10-CM | POA: Diagnosis not present

## 2023-04-13 DIAGNOSIS — H43813 Vitreous degeneration, bilateral: Secondary | ICD-10-CM | POA: Diagnosis not present

## 2023-04-13 DIAGNOSIS — H2513 Age-related nuclear cataract, bilateral: Secondary | ICD-10-CM | POA: Diagnosis not present

## 2023-04-18 DIAGNOSIS — M25511 Pain in right shoulder: Secondary | ICD-10-CM

## 2023-04-18 HISTORY — DX: Pain in right shoulder: M25.511

## 2023-04-18 NOTE — Assessment & Plan Note (Signed)
Chronic.  Stable.  At goal per JNC 8 guidelines for age less than 140/90.   Refill Bystolic 2.5 mg daily Refill losartan 25 mg daily

## 2023-04-18 NOTE — Assessment & Plan Note (Addendum)
Chronic.  Recent LDL improved since statin switched.   Refill Crestor 20 mg daily Recheck lipids at next visit, if remains elevated consider addition of Zetia

## 2023-04-18 NOTE — Assessment & Plan Note (Signed)
Chronic.  Stable.  Blood pressure well-controlled on current medication Continue losartan 25 mg daily Continue nebivolol 2.5 mg daily Follows with nephrology. Avoid nephrotoxic agents.

## 2023-04-19 ENCOUNTER — Encounter: Payer: Self-pay | Admitting: Family Medicine

## 2023-04-19 DIAGNOSIS — Z0000 Encounter for general adult medical examination without abnormal findings: Secondary | ICD-10-CM | POA: Insufficient documentation

## 2023-04-19 NOTE — Assessment & Plan Note (Addendum)
Acute on Chronic Low suspicion for rotator cuff given no injury.  Suspect shoulder impingement Will obtain imaging today Start Prednisone 20 mg daily x 5 days Start Baclofen 5 mg at night as needed Heat/Ice as needed Offered Ortho and physical therapy referral, patient declined Follow up if no improvement

## 2023-04-19 NOTE — Assessment & Plan Note (Addendum)
Recommend shingles vaccine Recommend Flu vaccine Mammogram up to date. Due 2025 Recommend regular self breast exams PHQ9/GAD screening

## 2023-05-04 ENCOUNTER — Telehealth: Payer: Self-pay | Admitting: Family Medicine

## 2023-05-04 NOTE — Telephone Encounter (Signed)
Pt would like to be called concerning getting an MRI and making an appointment

## 2023-05-06 NOTE — Telephone Encounter (Signed)
Referral has been placed. 

## 2023-05-06 NOTE — Addendum Note (Signed)
Addended by: Vertis Kelch on: 05/06/2023 03:40 PM   Modules accepted: Orders

## 2023-05-07 ENCOUNTER — Other Ambulatory Visit: Payer: Self-pay | Admitting: Family Medicine

## 2023-05-07 DIAGNOSIS — M25511 Pain in right shoulder: Secondary | ICD-10-CM

## 2023-05-10 ENCOUNTER — Telehealth: Payer: Self-pay | Admitting: Family Medicine

## 2023-05-10 NOTE — Telephone Encounter (Signed)
Lft pt vm to call ofc to sch mri. thanks

## 2023-05-11 ENCOUNTER — Telehealth: Payer: Self-pay | Admitting: Family Medicine

## 2023-05-11 DIAGNOSIS — I129 Hypertensive chronic kidney disease with stage 1 through stage 4 chronic kidney disease, or unspecified chronic kidney disease: Secondary | ICD-10-CM | POA: Diagnosis not present

## 2023-05-11 DIAGNOSIS — N1832 Chronic kidney disease, stage 3b: Secondary | ICD-10-CM | POA: Diagnosis not present

## 2023-05-11 DIAGNOSIS — N2581 Secondary hyperparathyroidism of renal origin: Secondary | ICD-10-CM | POA: Diagnosis not present

## 2023-05-11 DIAGNOSIS — N281 Cyst of kidney, acquired: Secondary | ICD-10-CM | POA: Diagnosis not present

## 2023-05-11 DIAGNOSIS — I10 Essential (primary) hypertension: Secondary | ICD-10-CM | POA: Diagnosis not present

## 2023-05-11 NOTE — Telephone Encounter (Signed)
Lft pt vm to call ofc to sch MRI. thanks 

## 2023-08-14 ENCOUNTER — Other Ambulatory Visit: Payer: Self-pay

## 2023-08-14 ENCOUNTER — Emergency Department
Admission: EM | Admit: 2023-08-14 | Discharge: 2023-08-14 | Disposition: A | Discharge status: Home / Self Care | Payer: Medicare (Managed Care) | Attending: Emergency Medicine | Admitting: Emergency Medicine

## 2023-08-14 ENCOUNTER — Emergency Department: Payer: Medicare (Managed Care)

## 2023-08-14 DIAGNOSIS — R29810 Facial weakness: Secondary | ICD-10-CM | POA: Diagnosis present

## 2023-08-14 DIAGNOSIS — N189 Chronic kidney disease, unspecified: Secondary | ICD-10-CM | POA: Diagnosis not present

## 2023-08-14 DIAGNOSIS — G510 Bell's palsy: Secondary | ICD-10-CM | POA: Insufficient documentation

## 2023-08-14 DIAGNOSIS — I129 Hypertensive chronic kidney disease with stage 1 through stage 4 chronic kidney disease, or unspecified chronic kidney disease: Secondary | ICD-10-CM | POA: Diagnosis not present

## 2023-08-14 LAB — COMPREHENSIVE METABOLIC PANEL
ALT: 16 U/L (ref 0–44)
AST: 20 U/L (ref 15–41)
Albumin: 3.8 g/dL (ref 3.5–5.0)
Alkaline Phosphatase: 74 U/L (ref 38–126)
Anion gap: 10 (ref 5–15)
BUN: 20 mg/dL (ref 8–23)
CO2: 22 mmol/L (ref 22–32)
Calcium: 9.8 mg/dL (ref 8.9–10.3)
Chloride: 111 mmol/L (ref 98–111)
Creatinine, Ser: 1.28 mg/dL — ABNORMAL HIGH (ref 0.44–1.00)
GFR, Estimated: 41 mL/min — ABNORMAL LOW (ref >60)
Glucose, Bld: 93 mg/dL (ref 70–99)
Potassium: 4.3 mmol/L (ref 3.5–5.1)
Sodium: 143 mmol/L (ref 135–145)
Total Bilirubin: 0.8 mg/dL (ref 0.0–1.2)
Total Protein: 7.3 g/dL (ref 6.5–8.1)

## 2023-08-14 LAB — CBC
HCT: 39.6 % (ref 36.0–46.0)
Hemoglobin: 12.9 g/dL (ref 12.0–15.0)
MCH: 30.6 pg (ref 26.0–34.0)
MCHC: 32.6 g/dL (ref 30.0–36.0)
MCV: 93.8 fL (ref 80.0–100.0)
Platelets: 212 10*3/uL (ref 150–400)
RBC: 4.22 MIL/uL (ref 3.87–5.11)
RDW: 15.1 % (ref 11.5–15.5)
WBC: 5.6 10*3/uL (ref 4.0–10.5)
nRBC: 0 % (ref 0.0–0.2)

## 2023-08-14 LAB — DIFFERENTIAL
Abs Immature Granulocytes: 0.02 10*3/uL (ref 0.00–0.07)
Basophils Absolute: 0 10*3/uL (ref 0.0–0.1)
Basophils Relative: 1 %
Eosinophils Absolute: 0.2 10*3/uL (ref 0.0–0.5)
Eosinophils Relative: 3 %
Immature Granulocytes: 0 %
Lymphocytes Relative: 41 %
Lymphs Abs: 2.3 10*3/uL (ref 0.7–4.0)
Monocytes Absolute: 0.3 10*3/uL (ref 0.1–1.0)
Monocytes Relative: 6 %
Neutro Abs: 2.7 10*3/uL (ref 1.7–7.7)
Neutrophils Relative %: 49 %

## 2023-08-14 LAB — PROTIME-INR
INR: 1 (ref 0.8–1.2)
Prothrombin Time: 13.4 s (ref 11.4–15.2)

## 2023-08-14 LAB — APTT: aPTT: 28 s (ref 24–36)

## 2023-08-14 LAB — ETHANOL: Alcohol, Ethyl (B): <10 mg/dL (ref <10)

## 2023-08-14 MED ORDER — PREDNISONE 20 MG PO TABS: 60.0000 mg | ORAL_TABLET | Freq: Every day | ORAL | 0 refills | Status: AC

## 2023-08-14 NOTE — Discharge Instructions (Signed)
You were seen in the ER today for your facial weakness.  I suspect this is likely related to something called Bell's palsy.  As we discussed, if you develop any new or concerning symptoms, it is very important you return to the ER for further evaluation.  Otherwise you can follow-up with your primary care doctor.  I have sent a prescription for a steroid medication to your pharmacy to help improve your symptoms sooner.  Please take this as directed.

## 2023-08-14 NOTE — ED Notes (Signed)
First nurse note:  Pt has L facial droop and L facial numbness since YESTERDAY. Pt is ambulatory with steady gait.

## 2023-08-14 NOTE — ED Provider Notes (Signed)
[facility] Provider Note    Event Date/Time   First MD Initiated Contact with Patient 08/14/23 1528     (approximate)   History   Facial Pain and Facial Droop   HPI  Maria Patterson is a 83 year old female with history of hypertension, CKD presenting to the emergency department for evaluation of facial pain and droop.  Yesterday around 5 PM, patient noticed that she had drooping over the left side of her face including her face, eyes, and forehead.  Has been persistent since that time.  Reports some discomfort over her left ear as well.  No fevers or chills.  No numbness, tingling, weakness in her extremities.  Has not had something like this before.     Physical Exam   Triage Vital Signs: ED Triage Vitals  Encounter Vitals Group     BP 08/14/23 1239 110/87     Systolic BP Percentile --      Diastolic BP Percentile --      Pulse Rate 08/14/23 1239 (!) 57     Resp 08/14/23 1239 16     Temp 08/14/23 1239 98.4 F (36.9 C)     Temp Source 08/14/23 1239 Oral     SpO2 08/14/23 1239 100 %     Weight 08/14/23 1242 170 lb (77.1 kg)     Height 08/14/23 1242 5\' 1"  (1.549 m)     Head Circumference --      Peak Flow --      Pain Score 08/14/23 1237 3     Pain Loc --      Pain Education --      Exclude from Growth Chart --     Most recent vital signs: Vitals:   08/14/23 1239  BP: 110/87  Pulse: (!) 57  Resp: 16  Temp: 98.4 F (36.9 C)  SpO2: 100%     General: Awake, interactive  HEENT: TMs clear, no intra ear lesions CV:  Regular rate, good peripheral perfusion.  Resp:  Unlabored respirations, lungs clear to auscultation Abd:  Nondistended Neuro:  Keenly aware, correctly answers month and age, able to blink eyes and squeeze hands, normal horizontal extraocular movements, no visual field loss, there is asymmetry over the left side of the face with the left eyebrow sitting lower on the face and the right eyebrow and minimal ability to elevate the  left eyebrow with normal ability to elevate the right eyebrow.  There is weakness with eye closing on the left side.  Sensation is intact and equal over the the bilateral face, there is a left-sided facial droop present. No arm or leg motor drift, no limb ataxia, normal sensation, no aphasia, no dysarthria, no inattention    ED Results / Procedures / Treatments   Labs (all labs ordered are listed, but only abnormal results are displayed) Labs Reviewed  COMPREHENSIVE METABOLIC PANEL - Abnormal; Notable for the following components:      Result Value   Creatinine, Ser 1.28 (*)    GFR, Estimated 41 (*)    All other components within normal limits  ETHANOL  DIFFERENTIAL  CBC  APTT  PROTIME-INR     EKG EKG independently reviewed interpreted by myself (ER attending) demonstrates:    RADIOLOGY Imaging independently reviewed and interpreted by myself demonstrates:  CT head without acute abnormality  PROCEDURES:  Critical Care performed: No  Procedures   MEDICATIONS ORDERED IN ED: Medications - No data to display   IMPRESSION / MDM / ASSESSMENT  AND PLAN / ED COURSE  I reviewed the triage vital signs and the nursing notes.  Differential diagnosis includes, but is not limited to, Bell's palsy, much lower suspicion CVA given forehead involvement and deficits outside of the left face, no evidence of Ramsay Hunt syndrome  Patient's presentation is most consistent with acute presentation with potential threat to life or bodily function.  83 year old female presenting with facial droop.  Vital stable on presentation.  Labs sent from triage are reassuring with stable mild creatinine elevation.  Patient does have headache, so CT head was obtained which was fortunately without acute abnormalities.  Her clinical presentation is consistent with Bell's palsy.  Did have extensive discussion with patient that should she develop any other deficits, this would be concerning for stroke and she  needs to return to the ER immediately.  Symptom started yesterday, so will initiate steroids.  Also discussed eye protection and was provided with eye covering to use.  Strict return precautions provided.  Patient discharged in stable condition.      FINAL CLINICAL IMPRESSION(S) / ED DIAGNOSES   Final diagnoses:  Bell's palsy     Rx / DC Orders   ED Discharge Orders          Ordered    predniSONE (DELTASONE) 20 MG tablet  Daily with breakfast        08/14/23 1619             Note:  This document was prepared using Dragon voice recognition software and may include unintentional dictation errors.   Trinna Post, MD 08/14/23 [phone]

## 2023-08-14 NOTE — ED Provider Triage Note (Signed)
Emergency Medicine Provider Triage Evaluation Note  Maria Patterson , a 83 y.o. female  was evaluated in triage.  Pt complains of left-sided facial droop started yesterday.  Review of Systems  Positive:  Negative:   Physical Exam  BP 110/87 (BP Location: Right Arm)   Pulse (!) 57   Temp 98.4 F (36.9 C) (Oral)   Resp 16   Ht 5\' 1"  (1.549 m)   Wt 77.1 kg   SpO2 100%   BMI 32.12 kg/m  Gen:   Awake, no distress   Resp:  Normal effort  MSK:   Moves extremities without difficulty  Other:  Left-sided facial droop noted, forehead involved  Medical Decision Making  Medically screening exam initiated at 12:52 PM.  Appropriate orders placed.  Maria Patterson was informed that the remainder of the evaluation will be completed by another provider, this initial triage assessment does not replace that evaluation, and the importance of remaining in the ED until their evaluation is complete.     Faythe Ghee, PA-C 08/14/23 1252

## 2023-08-14 NOTE — ED Triage Notes (Signed)
Pt to ED for L facial droop and numbness since about 1700 yesterday. States before this, began having pain to top of L head, tender to touch and slightly numb. L facial droop noted and also L eye appears smaller. Pt does NOT have arm drift and walks with steady gait.

## 2023-08-16 ENCOUNTER — Telehealth: Payer: Self-pay

## 2023-08-16 NOTE — Transitions of Care (Post Inpatient/ED Visit) (Signed)
08/16/2023  Name: Maria Patterson MRN: [id number] DOB: 1940-11-17  Today's TOC FU Call Status: Today's TOC FU Call Status:: Successful TOC FU Call Completed TOC FU Call Complete Date: 08/16/23 Patient's Name and Date of Birth confirmed.  Transition Care Management Follow-up Telephone Call Date of Discharge: 08/14/23 Discharge Facility: [facility]) Type of Discharge: Emergency Department Reason for ED Visit: Other: (Bell's palsy) How have you been since you were released from the hospital?: Same Any questions or concerns?: No  Items Reviewed: Did you receive and understand the discharge instructions provided?: Yes Medications obtained,verified, and reconciled?: Yes (Medications Reviewed) Any new allergies since your discharge?: No Dietary orders reviewed?: Yes Do you have support at home?: No  Medications Reviewed Today: Medications Reviewed Today     Reviewed by Karena Addison, LPN (Licensed Practical Nurse) on 08/16/23 at 1718  Med List Status: <None>   Medication Order Taking? Sig Documenting Provider Last Dose Status Informant  acetaminophen (TYLENOL) 325 MG tablet [id number] No Take 2 tablets (650 mg total) by mouth every 6 (six) hours as needed. Sharman Cheek, MD Taking Active   aspirin 81 MG tablet [id number] No Take 81 mg by mouth. Takes every 3-4 days. [provider] Taking Active Self  baclofen (LIORESAL) 10 MG tablet [id number]  Take 0.5 tablets (5 mg total) by mouth 3 (three) times daily. Dana Allan, MD  Active   Cholecalciferol (VITAMIN D) 2000 units tablet [id number] No Take 2,000 Units by mouth 3 (three) times a week.  Kerman Passey, MD Taking Active Self  ferrous sulfate 325 (65 FE) MG EC tablet [id number] No Take 325 mg by mouth 3 (three) times a week.  Kerman Passey, MD Taking Active Self  fluticasone (FLONASE) 50 MCG/ACT nasal spray [id number]  Place 2 sprays into both nostrils daily as needed for allergies or rhinitis.  Dana Allan, MD  Active   losartan (COZAAR) 25 MG tablet [id number]  Take 1 tablet (25 mg total) by mouth daily. Dana Allan, MD  Active   Multiple Vitamin (MULTIVITAMIN WITH MINERALS) TABS tablet [id number] No Take 1 tablet by mouth 3 (three) times a week.  Kerman Passey, MD Taking Active Self  nebivolol (BYSTOLIC) 2.5 MG tablet [id number]  Take 1 tablet (2.5 mg total) by mouth daily. Dana Allan, MD  Active   pantoprazole (PROTONIX) 40 MG tablet [id number] No Take 1 tablet (40 mg total) by mouth daily. 30 minutes before food Dana Allan, MD Taking Active   Polyethyl Glycol-Propyl Glycol (SYSTANE OP) [id number] No Place 1 drop into both eyes daily as needed (dry eyes).  [provider] Taking Active Self  predniSONE (DELTASONE) 20 MG tablet [id number]  Take 3 tablets (60 mg total) by mouth daily with breakfast for 7 days. Trinna Post, MD  Active   rosuvastatin (CRESTOR) 20 MG tablet [id number]  Take 1 tablet (20 mg total) by mouth daily. D/c lipitor 40 Dana Allan, MD  Active   sodium chloride (OCEAN) 0.65 % SOLN nasal spray [id number] No Place 1 spray into both nostrils as needed for congestion. Dana Allan, MD Taking Active             Home Care and Equipment/Supplies: Were Home Health Services Ordered?: NA Any new equipment or medical supplies ordered?: NA  Functional Questionnaire: Do you need assistance with bathing/showering or dressing?: No Do you need assistance with meal preparation?: No Do you need assistance with eating?: No Do you have difficulty maintaining continence: No Do you need assistance  with getting out of bed/getting out of a chair/moving?: No Do you have difficulty managing or taking your medications?: No  Follow up appointments reviewed: PCP Follow-up appointment confirmed?: No (declined appt) MD Provider Line Number:[phone] Given: No Specialist Hospital Follow-up appointment confirmed?: NA Do you need transportation to your follow-up appointment?:  No Do you understand care options if your condition(s) worsen?: Yes-patient verbalized understanding    SIGNATURE Karena Addison, LPN [facility] Nurse Health Advisor Direct Dial [phone]

## 2023-09-07 ENCOUNTER — Telehealth: Payer: Self-pay

## 2023-09-07 ENCOUNTER — Encounter: Payer: Self-pay | Admitting: Family Medicine

## 2023-09-07 ENCOUNTER — Ambulatory Visit (INDEPENDENT_AMBULATORY_CARE_PROVIDER_SITE_OTHER): Payer: Medicare (Managed Care) | Admitting: Family Medicine

## 2023-09-07 VITALS — BP 108/60 | HR 66 | Temp 98.4°F | Resp 18 | Ht 61.0 in | Wt 174.1 lb

## 2023-09-07 DIAGNOSIS — G510 Bell's palsy: Secondary | ICD-10-CM | POA: Diagnosis not present

## 2023-09-07 DIAGNOSIS — I151 Hypertension secondary to other renal disorders: Secondary | ICD-10-CM

## 2023-09-07 DIAGNOSIS — M25511 Pain in right shoulder: Secondary | ICD-10-CM

## 2023-09-07 DIAGNOSIS — E785 Hyperlipidemia, unspecified: Secondary | ICD-10-CM | POA: Diagnosis not present

## 2023-09-07 DIAGNOSIS — J3089 Other allergic rhinitis: Secondary | ICD-10-CM

## 2023-09-07 DIAGNOSIS — I700 Atherosclerosis of aorta: Secondary | ICD-10-CM | POA: Diagnosis not present

## 2023-09-07 DIAGNOSIS — M47812 Spondylosis without myelopathy or radiculopathy, cervical region: Secondary | ICD-10-CM

## 2023-09-07 MED ORDER — AZELASTINE HCL 0.1 % NA SOLN
2.0000 | Freq: Two times a day (BID) | NASAL | 12 refills | Status: DC
Start: 2023-09-07 — End: 2024-03-06

## 2023-09-07 MED ORDER — FLUTICASONE PROPIONATE 50 MCG/ACT NA SUSP
2.0000 | Freq: Two times a day (BID) | NASAL | 11 refills | Status: AC
Start: 2023-09-07 — End: ?

## 2023-09-07 MED ORDER — BACLOFEN 10 MG PO TABS
10.0000 mg | ORAL_TABLET | Freq: Two times a day (BID) | ORAL | 0 refills | Status: DC
Start: 2023-09-07 — End: 2023-12-22

## 2023-09-07 NOTE — Patient Instructions (Addendum)
It was a pleasure meeting you today. Thank you for allowing me to take part in your health care.  Our goals for today as we discussed include:  Will send referral to Neurology for evaluation of Bells Palsy  Please call your pharmacy for refills of your blood pressure medication and cholesterol medication.    Start Baclofen 10 mg two times a day for muscle spasms.  If no improvement follow up with MD  Your recent blood work showed that your blood sugar was normal.   This is a list of the screening recommended for you and due dates:  Health Maintenance  Topic Date Due   Zoster (Shingles) Vaccine (1 of 2) Never done   COVID-19 Vaccine (5 - 2024-25 season) 03/21/2023   Medicare Annual Wellness Visit  10/29/2023   Flu Shot  10/18/2023*   Mammogram  10/22/2023   DTaP/Tdap/Td vaccine (2 - Td or Tdap) 02/06/2031   Pneumonia Vaccine  Completed   DEXA scan (bone density measurement)  Completed   HPV Vaccine  Aged Out  *Topic was postponed. The date shown is not the original due date.      If you have any questions or concerns, please do not hesitate to call the office at [phone].  I look forward to our next visit and until then take care and stay safe.  Regards,   Dana Allan, MD   [facility]

## 2023-09-07 NOTE — Telephone Encounter (Signed)
At check-out today, patient states she thinks she is supposed to follow-up with her PCP, but she doesn't know how far out to schedule her follow-up visit.  Our check-out instructions state, "Return for PCP".  I was unable to reach [facility], CMA, at the time of check-out.  I let patient know that I will send a message to Dr. Astrid Divine nurse to find out and we will call her to schedule.

## 2023-09-07 NOTE — Progress Notes (Signed)
 SUBJECTIVE:   Chief Complaint  Patient presents with   Hospitalization Follow-up    Bells Palsy   HPI Patient presents to clinic for hospital follow up  Discussed the use of AI scribe software for clinical note transcription with the patient, who gave verbal consent to proceed.  History of Present Illness Maria Patterson is an 83 year old female who presents for a follow-up after being diagnosed with Bell's palsy.  Her symptoms have improved since her last visit a month ago. Initially, she was prescribed a medication regimen of 25 mg, three times a day for one week, along with Tylenol. She experiences ongoing facial discomfort, particularly on one side, with pressure and soreness in the sinus area. The facial drooping has improved, but she still experiences some numbness and lightheadedness.  She has a history of sinus issues, with significant pressure and soreness on one side of her face. A CT scan of her head was performed previously, which was reported as normal. She has been using Flonase for her sinus symptoms but has run out of the medication. She reports some nasal congestion but denies having a runny nose, fever, or cough.  She is currently taking nebivolol for blood pressure and Crestor for cholesterol management. She also mentions using losartan. No chest pain or shortness of breath.  She is concerned about her blood sugar levels and mentions a history of arthritis, which she describes as 'up and down' in severity. She experiences tenderness in her neck and head, which she attributes to arthritis.      PERTINENT PMH / PSH: As above  OBJECTIVE:  BP 108/60   Pulse 66   Temp 98.4 F (36.9 C)   Resp 18   Ht 5\' 1"  (1.549 m)   Wt 174 lb 2 oz (79 kg)   SpO2 99%   BMI 32.90 kg/m    Physical Exam Vitals reviewed.  Constitutional:      General: She is not in acute distress.    Appearance: Normal appearance. She is normal weight. She is not ill-appearing, toxic-appearing  or diaphoretic.  Eyes:     General:        Right eye: No discharge.        Left eye: No discharge.     Conjunctiva/sclera: Conjunctivae normal.  Cardiovascular:     Rate and Rhythm: Normal rate and regular rhythm.     Heart sounds: Normal heart sounds.  Pulmonary:     Effort: Pulmonary effort is normal.     Breath sounds: Normal breath sounds.  Abdominal:     General: Bowel sounds are normal.  Musculoskeletal:        General: Normal range of motion.  Skin:    General: Skin is warm and dry.  Neurological:     Mental Status: She is alert and oriented to person, place, and time. Mental status is at baseline.     Cranial Nerves: Facial asymmetry present.     Comments: Left lower facial drooping.    Psychiatric:        Mood and Affect: Mood normal.        Behavior: Behavior normal.        Thought Content: Thought content normal.        Judgment: Judgment normal.           04/12/2023    8:54 AM 01/19/2023    1:38 PM 11/09/2022    8:05 AM 10/29/2022    1:36 PM 04/10/2022  2:22 PM  Depression screen PHQ 2/9  Decreased Interest 0 0 0 0 0  Down, Depressed, Hopeless 0 0 0 0 0  PHQ - 2 Score 0 0 0 0 0  Altered sleeping 0 0 0    Tired, decreased energy 0 0 0    Change in appetite 0 0 0    Feeling bad or failure about yourself  0 0 0    Trouble concentrating 0 0 0    Moving slowly or fidgety/restless 0 0 0    Suicidal thoughts 0 0 0    PHQ-9 Score 0 0 0    Difficult doing work/chores Not difficult at all Not difficult at all Not difficult at all        04/12/2023    8:55 AM 01/19/2023    1:38 PM 11/09/2022    8:05 AM 01/18/2020    8:37 AM  GAD 7 : Generalized Anxiety Score  Nervous, Anxious, on Edge 0 0 0 0  Control/stop worrying 0 0 0 0  Worry too much - different things 0 0 0 0  Trouble relaxing 0 0 0 0  Restless 0 0 0 0  Easily annoyed or irritable 0 0 0 0  Afraid - awful might happen 0 0 0 0  Total GAD 7 Score 0 0 0 0  Anxiety Difficulty Not difficult at all Not  difficult at all Not difficult at all Not difficult at all    ASSESSMENT/PLAN:  Bell's palsy Assessment & Plan: Improvement noted since last visit a month ago. No new treatment initiated since initial course of prednisone daily for one week. Continues to have left side facial drooping -Continue to monitor for further improvement. -Refer to Neurology  Orders: -     Ambulatory referral to Neurology  Non-seasonal allergic rhinitis, unspecified trigger Assessment & Plan: Refill Flonase 2 sprays daily  Orders: -     Fluticasone Propionate; Place 2 sprays into both nostrils 2 (two) times daily.  Dispense: 16 g; Refill: 11 -     Azelastine HCl; Place 2 sprays into both nostrils 2 (two) times daily. 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  Stable.  At goal per JNC 8 guidelines for age less than 140/90.   -Continue Bystolic 2.5 mg daily -Continue Losartan 25 mg daily       Aortic atherosclerosis (HCC) Assessment & Plan: Continue Crestor 20 mg daily.    Cervical arthritis Assessment & Plan: Patient reports chronic neck tension, which may be contributing to head discomfort. Baclofen previously prescribed but patient reports it did not provide relief. -Consider alternative treatment options for neck tension.  Orders: -     Baclofen; Take 1 tablet (10 mg total) by mouth 2 (two) times daily.  Dispense: 60 tablet; Refill: 0     PDMP reviewed  Return in 6 months (on 03/06/2024) for PCP.  Dana Allan, MD

## 2023-09-07 NOTE — Telephone Encounter (Signed)
Called pt and scheduled her a 6 month follow up appointment.

## 2023-09-13 ENCOUNTER — Encounter: Payer: Self-pay | Admitting: Family Medicine

## 2023-09-13 DIAGNOSIS — G510 Bell's palsy: Secondary | ICD-10-CM | POA: Insufficient documentation

## 2023-09-13 NOTE — Assessment & Plan Note (Signed)
 Chronic.  Stable.  At goal per JNC 8 guidelines for age less than 140/90.   -Continue Bystolic 2.5 mg daily -Continue Losartan 25 mg daily

## 2023-09-13 NOTE — Assessment & Plan Note (Signed)
 Refill Flonase 2 sprays daily

## 2023-09-13 NOTE — Assessment & Plan Note (Signed)
 Continue Crestor 20 mg daily.

## 2023-09-13 NOTE — Assessment & Plan Note (Signed)
 Tolerating statin -Continue Crestor 20 mg daily -Check lipids annually

## 2023-09-13 NOTE — Assessment & Plan Note (Signed)
 Patient reports chronic neck tension, which may be contributing to head discomfort. Baclofen previously prescribed but patient reports it did not provide relief. -Consider alternative treatment options for neck tension.

## 2023-09-13 NOTE — Assessment & Plan Note (Addendum)
 Improvement noted since last visit a month ago. No new treatment initiated since initial course of prednisone daily for one week. Continues to have left side facial drooping -Continue to monitor for further improvement. -Refer to Neurology

## 2023-09-15 ENCOUNTER — Ambulatory Visit: Payer: Self-pay | Admitting: Family Medicine

## 2023-09-15 NOTE — Telephone Encounter (Signed)
 Copied from CRM [id number]. Topic: Clinical - Red Word Triage >> Sep 15, 2023  1:18 PM Maria Patterson wrote: [facility] that prompted transfer to Nurse Triage: Left arm swollen   Chief Complaint: Left arm swelling Symptoms: arm swelling Frequency: intermittent Pertinent Negatives: Patient denies pain or signs of infection Disposition: [] ED /[] Urgent Care (no appt availability in office) / [] Appointment(In office/virtual)/ []  [facility]/ [] Home Care/ [] Refused Recommended Disposition /[] [facility]/ []  Follow-up with PCP Additional Notes: pt reports mild swelling at baseline due to history of breast cancer, pt sts she wears Patterson compression sleeve daily but her arms feels more swollen. PCP appt scheduled for 2/27 at 11am  Reason for Disposition  MODERATE arm swelling (e.g., puffiness or swollen feeling of entire arm)  Answer Assessment - Initial Assessment Questions 1. ONSET: "When did the swelling start?" (e.g., minutes, hours, days)     Started 1 week ago  2. LOCATION: "What part of the arm is swollen?"  "Are both arms swollen or just one arm?"     Left; Shoulder to wrist  3. SEVERITY: "How bad is the swelling?" (e.g., localized; mild, moderate, severe)   - LOCALIZED: Small area of puffiness or swelling on just one arm   - JOINT SWELLING: Swelling of one joint   - MILD: Puffiness or swelling of hand   - MODERATE: Puffiness or swollen feeling of entire arm    - SEVERE: All of arm looks swollen; pitting edema     Moderate swelling  4. REDNESS: "Does the swelling look red or infected?"     No redness  5. PAIN: "Is the swelling painful to touch?" If Yes, ask: "How painful is it?"   (Scale 1-10; mild, moderate or severe)     No pain  6. FEVER: "Do you have Patterson fever?" If Yes, ask: "What is it, how was it measured, and when did it start?"      No fever  7. CAUSE: "What do you think is causing the arm swelling?"     Unsure, but think that since she has swelling she has some  occasional swelling  8. MEDICAL HISTORY: "Do you have Patterson history of heart failure, kidney disease, liver failure, or cancer?"     Breast cancer, Stage 3 CKD  9. RECURRENT SYMPTOM: "Have you had arm swelling before?" If Yes, ask: "When was the last time?" "What happened that time?"     Yes, has some occasional swelling, wears Patterson compression sleeve  10. OTHER SYMPTOMS: "Do you have any other symptoms?" (e.g., chest pain, difficulty breathing)       No  11. PREGNANCY: "Is there any chance you are pregnant?" "When was your last menstrual period?"       No  Protocols used: Arm Swelling and Edema-Patterson-AH

## 2023-09-15 NOTE — Telephone Encounter (Signed)
 Pt has an appointment to see Dr. Clent Ridges tomorrow at 11 am

## 2023-09-16 ENCOUNTER — Ambulatory Visit: Payer: Medicare (Managed Care) | Admitting: Family Medicine

## 2023-09-16 ENCOUNTER — Encounter: Payer: Self-pay | Admitting: Family Medicine

## 2023-09-16 VITALS — BP 132/62 | HR 69 | Temp 98.3°F | Resp 18 | Ht 61.0 in | Wt 175.5 lb

## 2023-09-16 DIAGNOSIS — G8929 Other chronic pain: Secondary | ICD-10-CM | POA: Diagnosis not present

## 2023-09-16 DIAGNOSIS — I890 Lymphedema, not elsewhere classified: Secondary | ICD-10-CM | POA: Diagnosis not present

## 2023-09-16 DIAGNOSIS — G510 Bell's palsy: Secondary | ICD-10-CM | POA: Diagnosis not present

## 2023-09-16 DIAGNOSIS — M25511 Pain in right shoulder: Secondary | ICD-10-CM | POA: Diagnosis not present

## 2023-09-16 NOTE — Progress Notes (Signed)
 SUBJECTIVE:   Chief Complaint  Patient presents with   Arm Swelling   HPI Presents for acute visit  Discussed the use of AI scribe software for clinical note transcription with the patient, who gave verbal consent to proceed.  History of Present Illness Maria Patterson is an 83 year old female with lymphedema who presents with left arm swelling. She was referred by a rehabilitation facility for evaluation of her arm swelling.  She has been experiencing persistent swelling in her left arm for the past two months. The swelling is firm and not associated with pain. It began after a previous eye problem resolved and has since affected the entire left side. Despite the swelling, there is no pain reported in the left arm.  She has a history of lymphedema in the left arm and has previously received treatment at a lymphedema clinic. She has not been using any lymphedema management tools like balls but has been using a machine as instructed. Her last visit to the lymphedema clinic was in January or February of the previous year, and she has not been seen there since her appointment period ended.  She reports numbness in the left side of her face, describing it as feeling like a 'toothache'. There is paralysis and lack of movement on the left side, and she does not feel sensation returning. She confirms the presence of numbness and lack of movement on the left side of her face.  Her right arm has also been affected, having turned blue and bruised, but it is not as blue as it was before. She is currently taking aspirin.      PERTINENT PMH / PSH: As above  OBJECTIVE:  BP 132/62   Pulse 69   Temp 98.3 F (36.8 C)   Resp 18   Ht 5\' 1"  (1.549 m)   Wt 175 lb 8 oz (79.6 kg)   SpO2 99%   BMI 33.16 kg/m    Physical Exam Vitals reviewed.  Constitutional:      General: She is not in acute distress.    Appearance: Normal appearance. She is normal weight. She is not ill-appearing,  toxic-appearing or diaphoretic.  Eyes:     General:        Right eye: No discharge.        Left eye: No discharge.     Conjunctiva/sclera: Conjunctivae normal.  Cardiovascular:     Rate and Rhythm: Normal rate and regular rhythm.     Heart sounds: Normal heart sounds.  Pulmonary:     Effort: Pulmonary effort is normal.     Breath sounds: Normal breath sounds.  Abdominal:     General: Bowel sounds are normal.  Musculoskeletal:        General: Normal range of motion.     Left upper arm: Edema present. No tenderness or bony tenderness.  Skin:    General: Skin is warm and dry.  Neurological:     General: No focal deficit present.     Mental Status: She is alert and oriented to person, place, and time. Mental status is at baseline.  Psychiatric:        Mood and Affect: Mood normal.        Behavior: Behavior normal.        Thought Content: Thought content normal.        Judgment: Judgment normal.           04/12/2023    8:54 AM 01/19/2023    1:38  PM 11/09/2022    8:05 AM 10/29/2022    1:36 PM 04/10/2022    2:22 PM  Depression screen PHQ 2/9  Decreased Interest 0 0 0 0 0  Down, Depressed, Hopeless 0 0 0 0 0  PHQ - 2 Score 0 0 0 0 0  Altered sleeping 0 0 0    Tired, decreased energy 0 0 0    Change in appetite 0 0 0    Feeling bad or failure about yourself  0 0 0    Trouble concentrating 0 0 0    Moving slowly or fidgety/restless 0 0 0    Suicidal thoughts 0 0 0    PHQ-9 Score 0 0 0    Difficult doing work/chores Not difficult at all Not difficult at all Not difficult at all        04/12/2023    8:55 AM 01/19/2023    1:38 PM 11/09/2022    8:05 AM 01/18/2020    8:37 AM  GAD 7 : Generalized Anxiety Score  Nervous, Anxious, on Edge 0 0 0 0  Control/stop worrying 0 0 0 0  Worry too much - different things 0 0 0 0  Trouble relaxing 0 0 0 0  Restless 0 0 0 0  Easily annoyed or irritable 0 0 0 0  Afraid - awful might happen 0 0 0 0  Total GAD 7 Score 0 0 0 0  Anxiety  Difficulty Not difficult at all Not difficult at all Not difficult at all Not difficult at all    ASSESSMENT/PLAN:  Lymphedema of arm Assessment & Plan: Increased swelling in the left arm over the past two months. No pain reported. History of lymphedema in the same arm. Low suspicion for VTE given chronic issue. -Order an ultrasound of the left arm to rule out clot. -If ultrasound is clear, refer to lymphedema clinic on [place].  Orders: -     VAS [place] UPPER EXTREMITY VENOUS DUPLEX; Future -     Ambulatory referral to Physical Medicine Rehab  Bell's palsy Assessment & Plan: Numbness and lack of sensation on the left side of the face. No improvement in movement noted. -Referral to neurology has been sent. Follow-up with neurology is advised.   Chronic right shoulder pain Assessment & Plan: Complaints of aching in the right shoulder. -MRI has been ordered. Follow-up appointment is scheduled.      PDMP reviewed  Return if symptoms worsen or fail to improve, for PCP.  Dana Allan, MD

## 2023-09-16 NOTE — Patient Instructions (Signed)
 It was a pleasure meeting you today. Thank you for allowing me to take part in your health care.  Our goals for today as we discussed include:  Will get ultrasound of left arm.  They will call you with an appointment  If negative will send in referral to Lymphedema clinic   This is a list of the screening recommended for you and due dates:  Health Maintenance  Topic Date Due   Zoster (Shingles) Vaccine (1 of 2) Never done   COVID-19 Vaccine (5 - 2024-25 season) 03/21/2023   Medicare Annual Wellness Visit  10/29/2023   Flu Shot  10/18/2023*   Mammogram  10/22/2023   DTaP/Tdap/Td vaccine (2 - Td or Tdap) 02/06/2031   Pneumonia Vaccine  Completed   DEXA scan (bone density measurement)  Completed   HPV Vaccine  Aged Out  *Topic was postponed. The date shown is not the original due date.      If you have any questions or concerns, please do not hesitate to call the office at [phone].  I look forward to our next visit and until then take care and stay safe.  Regards,   Dana Allan, MD   [facility]

## 2023-09-24 ENCOUNTER — Encounter: Payer: Self-pay | Admitting: Family Medicine

## 2023-09-24 NOTE — Assessment & Plan Note (Signed)
 Complaints of aching in the right shoulder. -MRI has been ordered. Follow-up appointment is scheduled.

## 2023-09-24 NOTE — Assessment & Plan Note (Addendum)
 Increased swelling in the left arm over the past two months. No pain reported. History of lymphedema in the same arm. Low suspicion for VTE given chronic issue. -Order an ultrasound of the left arm to rule out clot. -If ultrasound is clear, refer to lymphedema clinic on [place].

## 2023-09-24 NOTE — Assessment & Plan Note (Signed)
 Numbness and lack of sensation on the left side of the face. No improvement in movement noted. -Referral to neurology has been sent. Follow-up with neurology is advised.

## 2023-09-29 DIAGNOSIS — H2513 Age-related nuclear cataract, bilateral: Secondary | ICD-10-CM | POA: Diagnosis not present

## 2023-09-29 DIAGNOSIS — H43813 Vitreous degeneration, bilateral: Secondary | ICD-10-CM | POA: Diagnosis not present

## 2023-09-29 DIAGNOSIS — M3501 Sicca syndrome with keratoconjunctivitis: Secondary | ICD-10-CM | POA: Diagnosis not present

## 2023-09-29 DIAGNOSIS — H5713 Ocular pain, bilateral: Secondary | ICD-10-CM | POA: Diagnosis not present

## 2023-10-07 ENCOUNTER — Other Ambulatory Visit: Payer: Self-pay | Admitting: Family Medicine

## 2023-10-07 DIAGNOSIS — Z1231 Encounter for screening mammogram for malignant neoplasm of breast: Secondary | ICD-10-CM

## 2023-10-11 ENCOUNTER — Ambulatory Visit: Payer: Medicare (Managed Care) | Attending: Family Medicine | Admitting: Occupational Therapy

## 2023-10-11 ENCOUNTER — Telehealth: Payer: Self-pay

## 2023-10-11 ENCOUNTER — Encounter: Payer: Self-pay | Admitting: Occupational Therapy

## 2023-10-11 DIAGNOSIS — I890 Lymphedema, not elsewhere classified: Secondary | ICD-10-CM

## 2023-10-11 DIAGNOSIS — I972 Postmastectomy lymphedema syndrome: Secondary | ICD-10-CM | POA: Insufficient documentation

## 2023-10-11 NOTE — Therapy (Signed)
 OUTPATIENT OCCUPATIONAL THERAPY LYMPHEDEMA EVALUATION  Patient Name: Maria Patterson MRN: [id number] DOB:Oct 18, 1940, 83 y.o., female Today's Date: 10/11/2023  PCP: Dr Meribeth Mattes PROVIDER: DR Clent Ridges  END OF SESSION:  OT End of Session - 10/11/23 1436     Visit Number 1    Number of Visits 6    Date for OT Re-Evaluation 01/03/24    OT Start Time 0900    OT Stop Time 0946    OT Time Calculation (min) 46 min    Activity Tolerance Patient tolerated treatment well    Behavior During Therapy [facility] for tasks assessed/performed             Past Medical History:  Diagnosis Date   Ache in joint 03/09/2016   Anemia    Anxiety and depression 05/12/2019   Breast cancer (HCC) 2018   Left   Breast mass, left 09/22/2016   RECOMMENDATION: Ultrasound-guided core biopsies of masses at left breast 2 o'clock 2 cm from nipple, left breast 2 o'clock 5 cm from nipple, abnormal left axillary lymph nodes.   Collapsed vertebra, not elsewhere classified, lumbar region, initial encounter for fracture (HCC) 05/04/2017   Formatting of this note might be different from the original.  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   History of breast cancer 02/04/2021   History of chickenpox    Hx of adenomatous colonic polyps    Hyperglycemia    Hyperlipidemia    Hypomagnesemia 05/04/2017   Insomnia 09/26/2017   Multiple renal cysts 05/15/2021   Obesity    Personal history of chemotherapy 2018   Left breast   Personal history of radiation therapy 2018   Postmenopausal    Postmenopausal 09/18/2016   Shingles    right eye    Spider  vein of lower extremity 12/20/2020   Tubular adenoma 01/18/2020   Past Surgical History:  Procedure Laterality Date   ABDOMINAL HYSTERECTOMY     AXILLARY SENTINEL NODE BIOPSY Left 04/14/2017   Procedure: AXILLARY SENTINEL NODE BIOPSY;  Surgeon: Henrene Dodge, MD;  Location: [facility] ORS;  Service: General;  Laterality: Left;   BREAST BIOPSY Left 09/30/2016   US biopsy of 3 areas + chemo   BREAST EXCISIONAL BIOPSY Left 04/14/2017   Left breast invasive cancer left mastectomy   COLONOSCOPY WITH PROPOFOL N/A 06/17/2020   Procedure: COLONOSCOPY WITH PROPOFOL;  Surgeon: Toledo, Boykin Nearing, MD;  Location: [facility];  Service: Gastroenterology;  Laterality: N/A;   ESOPHAGOGASTRODUODENOSCOPY (EGD) WITH PROPOFOL N/A 12/24/2014   Procedure: ESOPHAGOGASTRODUODENOSCOPY (EGD) WITH PROPOFOL;  Surgeon: Scot Jun, MD;  Location: [facility] END[facility];  Service: Endoscopy;  Laterality: N/A;   MASTECTOMY Left 04/14/2017   Left breast invasive cancer   PORTACATH PLACEMENT Right 10/27/2016   Procedure: INSERTION PORT-A-CATH;  Surgeon: Hulda Marin, MD;  Location: [facility] OR[facility];  Service: General;  Laterality: Right;   TONSILECTOMY, ADENOIDECTOMY, BILATERAL MYRINGOTOMY AND TUBES     TONSILLECTOMY     TOTAL MASTECTOMY Left 04/14/2017   Procedure: TOTAL MASTECTOMY;  Surgeon: Henrene Dodge, MD;  Location: [facility] OR[facility];  Service: General;  Laterality: Left;   Patient Active Problem  List   Diagnosis Date Noted   Bell's palsy 09/13/2023   Annual physical exam 04/19/2023   Acute pain of right shoulder 04/18/2023   Urinary tract infection without hematuria 02/17/2023   Candida infection 02/17/2023   Vitamin D deficiency 07/24/2022   Iron deficiency 07/24/2022   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   Cervical arthritis 02/04/2021   Lymphedema of arm 09/18/2020   Chronic right shoulder pain 09/18/2020   Osteoporosis 05/12/2019   Pre-diabetes 02/19/2019   H/O total mastectomy of left breast 06/03/2018   Obesity  (BMI 30.0-34.9) 03/08/2018   Stage 3b chronic kidney disease (HCC) 08/09/2017   Aortic atherosclerosis (HCC) 05/04/2017   Degenerative disc disease, lumbar 05/04/2017   Anemia 02/01/2017   Atherosclerosis of native arteries of extremity with intermittent claudication (HCC) 11/09/2016   Hypertension 11/07/2016   Allergic rhinitis 11/07/2016   Malignant neoplasm of upper-outer quadrant of left female breast (HCC) 10/08/2016   Gastroesophageal reflux disease 07/01/2012   Hyperlipidemia 07/01/2012    ONSET DATE: 2019  REFERRING DIAG: L UE lymphedema  THERAPY DIAG:  Postmastectomy lymphedema syndrome  Rationale for Evaluation and Treatment: Rehabilitation  SUBJECTIVE:   SUBJECTIVE STATEMENT: I woke up 1 morning earlier this year with palsy of my face but also my arm.  So I did not really do my pump or wear anything for a while.  I started back again using my pump and wearing the night sleeve at night and daytime .  But the Velcro is not sticking anymore Pt accompanied by: self  PERTINENT HISTORY: Lymphedema of arm Dr Clent Ridges visit note 09/24/23 Assessment & Plan: Increased swelling in the left arm over the past two months. No pain reported. History of lymphedema in the same arm. Low suspicion for VTE given chronic issue. -Order an ultrasound of the left arm to rule out clot. -If ultrasound is clear, refer to lymphedema clinic on [place].   Orders: -     VAS [place] UPPER EXTREMITY VENOUS DUPLEX; Future -     Ambulatory referral to Physical Medicine Rehab   Bell's palsy Assessment & Plan: Numbness and lack of sensation on the left side of the face. No improvement in movement noted. -Referral to neurology has been sent. Follow-up with neurology is advised.     Chronic right shoulder pain Assessment & Plan: Complaints of aching in the right shoulder. -MRI has been ordered. Follow-up appointment is scheduled.  PRECAUTIONS: L UE lymphedema   WEIGHT BEARING  RESTRICTIONSNo  PAIN:  Are you having pain? No  FALLS: Has patient fallen in last 6 months?No  LIVING ENVIRONMENT: Lives with: lives with their family   PLOF: Patient was seen by this OT in 2019 and 2020 for left upper upper extremity lymphedema after cellulitis episodes.  Her last visit with me was in 2023 also after episodes cellulitis.  PATIENT GOALS: To get some new sleeves to help my lymphedema   OBJECTIVE:  Note: Objective measures were completed at Evaluation unless otherwise noted.  HAND DOMINANCE: Right  ADLs: Patient independent in ADLs  FUNCTIONAL OUTCOME MEASURES: To be assessed next session  UPPER EXTREMITY ROM: Active range of motion bilateral shoulders within functional limits. Per patient no issues or concerns     LYMPHEDEMA/ONCOLOGY QUESTIONNAIRE - 10/11/23 0001       Right Upper Extremity Lymphedema   15 cm Proximal to Olecranon Process 34 cm    10 cm Proximal to Olecranon Process 32.4 cm    Olecranon Process 25.5 cm  15 cm Proximal to Ulnar Styloid Process 26 cm    10 cm Proximal to Ulnar Styloid Process 22.4 cm    Just Proximal to Ulnar Styloid Process 16.5 cm      Left Upper Extremity Lymphedema   15 cm Proximal to Olecranon Process 34.5 cm    10 cm Proximal to Olecranon Process 35.5 cm    Olecranon Process 29.4 cm    15 cm Proximal to Ulnar Styloid Process 31.4 cm    10 cm Proximal to Ulnar Styloid Process 27.5 cm    Just Proximal to Ulnar Styloid Process 20.2 cm    Across Hand at [facility] 19.5 cm              COGNITION: Overall cognitive status:  But shows some decreased memory with follow through with exercises and instructions from prior     TREATMENT DATE:10/10/33                                                                                                                           Patient arrived with a tribute night sleeve.  Not wearing it. Velcro not sticking anymore.  Was fitted on patient originally in  2020. Markers of for patient to adjust correctly. Patient daytime compression sleeve also from 2023.  Did not bring that in. Patient report pump still working. Patient in need for new daytime and nighttime compression sleeves to maintain her lymphedema on the left upper extremity and decrease cellulitis episodes Patient to do her pump morning and evening And wearing her tribute day and nighttime is much as she can with 1 compression bandage from wrist through the hand and circular 50% overlap up to axilla to provide extra compression. Patient sent to [facility] to get measured for new nighttime and daytime compression.   PATIENT EDUCATION: Education details: findings of eval and HEP  Person educated: Patient Education method: Explanation, Demonstration, Tactile cues, Verbal cues, and Handouts Education comprehension: verbalized understanding, returned demonstration, verbal cues required, and needs further education    GOALS: Goals reviewed with patient? Yes   LONG TERM GOALS: Target date 12 wks   Patient to be fitted with new daytime and nighttime compression to maintain circumference and prevent future cellulitis episodes. Baseline: Patient's nighttime tribute sleeve still from 2019.  Velcro not working.  Not fitting patient well.  Daytime compression sleeve is from 2023.  Had Bell's palsy was not able to wear compression.  Left upper extremity circumference increased Goal status: INITIAL  2.  Patient to be independent in home program of wearing correct compression and using pump to maintain her circumference in left upper extremity. Baseline: Patient's garments is older than 2019 and 2023.  Patient had Bell's palsy earlier this year.  Was unable to use garments.  Left upper extremity increased.  Goal status: INITIAL  ASSESSMENT:  CLINICAL IMPRESSION: Patient seen today for occupational therapy evaluation for left upper extremity lymphedema.  Patient was seen by this  OT in  2019 for same diagnosis after breast cancer with episodes of cellulitis.  Patient was set up with home program and return to OT again in 2023 for new daytime compression.  Also had episode of cellulitis.  Patient returns now after having Bell's palsy earlier this year.  Was not able to wear her compression.  Patient using a pump morning and evening 45 minutes to hour.  Patient's daytime custom [employer] soft sleeve is formed 2023.  Tribute night sleeve is still from 2019.  Patient in need of new daytime and nighttime compression including glove for daytime and handpiece for nighttime.  Patient's wrist increased significantly and have some increase lymphedema and left hand 2.  Patient can benefit from skilled OT services to decrease lymphedema in the left upper extremity setting up her with the correct home program to be able to follow through and prevent future cellulitis episodes.  PERFORMANCE DEFICITS: in functional skills including ADLs, IADLs, ROM, strength, pain, flexibility, decreased knowledge of use of DME, and UE functional use,   and psychosocial skills including environmental adaptation and routines and behaviors.   IMPAIRMENTS: are limiting patient from ADLs, IADLs, rest and sleep, play, leisure, and social participation.   COMORBIDITIES: has  Lymphedema as co-morbidities that affects occupational performance. Patient will benefit from skilled OT to address above impairments and improve overall function.  MODIFICATION OR ASSISTANCE TO COMPLETE EVALUATION: No modification of tasks or assist necessary to complete an evaluation.  OT OCCUPATIONAL PROFILE AND HISTORY: Problem focused assessment: Including review of records relating to presenting problem.  CLINICAL DECISION MAKING: LOW - limited treatment options, no task modification necessary  REHAB POTENTIAL: Good for goals  EVALUATION COMPLEXITY: Low      PLAN:  OT FREQUENCY: 6 visits  OT DURATION: 12 weeks  PLANNED  INTERVENTIONS: 97168 OT Re-evaluation, 97535 self care/ADL training, 16109 therapeutic exercise, 97530 therapeutic activity, 97140 manual therapy, compression bandaging, patient/family education, and DME and/or AE instructions    CONSULTED AND AGREED WITH PLAN OF CARE: Patient     Oletta Cohn, OTR/L,CLT 10/11/2023, 6:32 PM

## 2023-10-25 ENCOUNTER — Ambulatory Visit
Admission: RE | Admit: 2023-10-25 | Discharge: 2023-10-25 | Disposition: A | Discharge status: Home / Self Care | Payer: Medicare (Managed Care) | Source: Ambulatory Visit | Attending: Family Medicine | Admitting: Family Medicine

## 2023-10-25 DIAGNOSIS — Z1231 Encounter for screening mammogram for malignant neoplasm of breast: Secondary | ICD-10-CM | POA: Insufficient documentation

## 2023-10-26 ENCOUNTER — Ambulatory Visit: Payer: Medicare (Managed Care) | Attending: Family Medicine | Admitting: Occupational Therapy

## 2023-10-26 DIAGNOSIS — I972 Postmastectomy lymphedema syndrome: Secondary | ICD-10-CM | POA: Insufficient documentation

## 2023-10-26 NOTE — Therapy (Signed)
 OUTPATIENT OCCUPATIONAL THERAPY LYMPHEDEMA TREATMENT  Patient Name: Maria Patterson MRN: [id number] DOB:July 29, 1940, 83 y.o., female Today's Date: 10/26/2023  PCP: Dr Meribeth Mattes PROVIDER: DR Clent Ridges  END OF SESSION:  OT End of Session - 10/26/23 0820     Visit Number 2    Number of Visits 6    Date for OT Re-Evaluation 01/03/24    OT Start Time 0815    OT Stop Time 0845    OT Time Calculation (min) 30 min    Activity Tolerance Patient tolerated treatment well    Behavior During Therapy [facility] for tasks assessed/performed             Past Medical History:  Diagnosis Date   Ache in joint 03/09/2016   Anemia    Anxiety and depression 05/12/2019   Breast cancer (HCC) 2018   Left   Breast mass, left 09/22/2016   RECOMMENDATION: Ultrasound-guided core biopsies of masses at left breast 2 o'clock 2 cm from nipple, left breast 2 o'clock 5 cm from nipple, abnormal left axillary lymph nodes.   Collapsed vertebra, not elsewhere classified, lumbar region, initial encounter for fracture (HCC) 05/04/2017   Formatting of this note might be different from the original.  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   History of breast cancer 02/04/2021   History of chickenpox    Hx of adenomatous colonic polyps    Hyperglycemia    Hyperlipidemia    Hypomagnesemia 05/04/2017   Insomnia 09/26/2017   Multiple renal cysts 05/15/2021   Obesity    Personal history of chemotherapy 2018   Left breast   Personal history of radiation therapy 2018   Postmenopausal    Postmenopausal 09/18/2016   Shingles    right eye    Spider  vein of lower extremity 12/20/2020   Tubular adenoma 01/18/2020   Past Surgical History:  Procedure Laterality Date   ABDOMINAL HYSTERECTOMY     AXILLARY SENTINEL NODE BIOPSY Left 04/14/2017   Procedure: AXILLARY SENTINEL NODE BIOPSY;  Surgeon: Henrene Dodge, MD;  Location: [facility] ORS;  Service: General;  Laterality: Left;   BREAST BIOPSY Left 09/30/2016   US biopsy of 3 areas + chemo   BREAST EXCISIONAL BIOPSY Left 04/14/2017   Left breast invasive cancer left mastectomy   COLONOSCOPY WITH PROPOFOL N/A 06/17/2020   Procedure: COLONOSCOPY WITH PROPOFOL;  Surgeon: Toledo, Boykin Nearing, MD;  Location: [facility] ENDOSCOPY;  Service: Gastroenterology;  Laterality: N/A;   ESOPHAGOGASTRODUODENOSCOPY (EGD) WITH PROPOFOL N/A 12/24/2014   Procedure: ESOPHAGOGASTRODUODENOSCOPY (EGD) WITH PROPOFOL;  Surgeon: Scot Jun, MD;  Location: [facility];  Service: Endoscopy;  Laterality: N/A;   MASTECTOMY Left 04/14/2017   Left breast invasive cancer   PORTACATH PLACEMENT Right 10/27/2016   Procedure: INSERTION PORT-A-CATH;  Surgeon: Hulda Marin, MD;  Location: [facility] ORS;  Service: General;  Laterality: Right;   TONSILECTOMY, ADENOIDECTOMY, BILATERAL MYRINGOTOMY AND TUBES     TONSILLECTOMY     TOTAL MASTECTOMY Left 04/14/2017   Procedure: TOTAL MASTECTOMY;  Surgeon: Henrene Dodge, MD;  Location: [facility] ORS;  Service: General;  Laterality: Left;   Patient Active Problem  List   Diagnosis Date Noted   Bell's palsy 09/13/2023   Annual physical exam 04/19/2023   Acute pain of right shoulder 04/18/2023   Urinary tract infection without hematuria 02/17/2023   Candida infection 02/17/2023   Vitamin D deficiency 07/24/2022   Iron deficiency 07/24/2022   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   Cervical arthritis 02/04/2021   Lymphedema of arm 09/18/2020   Chronic right shoulder pain 09/18/2020   Osteoporosis 05/12/2019   Pre-diabetes 02/19/2019   H/O total mastectomy of left breast 06/03/2018   Obesity  (BMI 30.0-34.9) 03/08/2018   Stage 3b chronic kidney disease (HCC) 08/09/2017   Aortic atherosclerosis (HCC) 05/04/2017   Degenerative disc disease, lumbar 05/04/2017   Anemia 02/01/2017   Atherosclerosis of native arteries of extremity with intermittent claudication (HCC) 11/09/2016   Hypertension 11/07/2016   Allergic rhinitis 11/07/2016   Malignant neoplasm of upper-outer quadrant of left female breast (HCC) 10/08/2016   Gastroesophageal reflux disease 07/01/2012   Hyperlipidemia 07/01/2012    ONSET DATE: 2019  REFERRING DIAG: L UE lymphedema  THERAPY DIAG:  Postmastectomy lymphedema syndrome  Rationale for Evaluation and Treatment: Rehabilitation  SUBJECTIVE:   SUBJECTIVE STATEMENT: I woke up 1 morning earlier this year with palsy of my face but also my arm.  So I did not really do my pump or wear anything for a while.  I started back again using my pump and wearing the night sleeve at night and daytime .  But the Velcro is not sticking anymore Pt accompanied by: self  PERTINENT HISTORY: Lymphedema of arm Dr Clent Ridges visit note 09/24/23 Assessment & Plan: Increased swelling in the left arm over the past two months. No pain reported. History of lymphedema in the same arm. Low suspicion for VTE given chronic issue. -Order an ultrasound of the left arm to rule out clot. -If ultrasound is clear, refer to lymphedema clinic on [place].   Orders: -     VAS [place] UPPER EXTREMITY VENOUS DUPLEX; Future -     Ambulatory referral to Physical Medicine Rehab   Bell's palsy Assessment & Plan: Numbness and lack of sensation on the left side of the face. No improvement in movement noted. -Referral to neurology has been sent. Follow-up with neurology is advised.     Chronic right shoulder pain Assessment & Plan: Complaints of aching in the right shoulder. -MRI has been ordered. Follow-up appointment is scheduled.  PRECAUTIONS: L UE lymphedema   WEIGHT BEARING  RESTRICTIONSNo  PAIN:  Are you having pain? No  FALLS: Has patient fallen in last 6 months?No  LIVING ENVIRONMENT: Lives with: lives with their family   PLOF: Patient was seen by this OT in 2019 and 2020 for left upper upper extremity lymphedema after cellulitis episodes.  Her last visit with me was in 2023 also after episodes cellulitis.  PATIENT GOALS: To get some new sleeves to help my lymphedema   OBJECTIVE:  Note: Objective measures were completed at Evaluation unless otherwise noted.  HAND DOMINANCE: Right  ADLs: Patient independent in ADLs  FUNCTIONAL OUTCOME MEASURES: To be assessed next session  UPPER EXTREMITY ROM: Active range of motion bilateral shoulders within functional limits. Per patient no issues or concerns     LYMPHEDEMA/ONCOLOGY QUESTIONNAIRE - 10/26/23 0001       Left Upper Extremity Lymphedema   15 cm Proximal to Olecranon Process 33.5 cm    10 cm Proximal to Olecranon Process 34 cm    Olecranon Process 28.5 cm  15 cm Proximal to Ulnar Styloid Process 28.5 cm    10 cm Proximal to Ulnar Styloid Process 26 cm    Just Proximal to Ulnar Styloid Process 21.4 cm    Across Hand at [facility] 21 cm           Measurements taken - pt decrease greatly in forearm , elbow and upper arm -but wrist and hand increased See flowsheets    COGNITION: Overall cognitive status:  But shows some decreased memory with follow through with exercises and instructions from prior     TREATMENT DATE:10/26/23       I went to [facility] that day when I left her.  And I called them yesterday. They still waiting for insurance I think. So I do not have any new sleeves yet.                                                                                                                      Patient arrived with a tribute night sleeve on and powersleeve- not all the way up  Hand piece not on - wanted OT to check fitting and how to put in correctly on  Velcro not  sticking anymore.  Was fitted on patient originally in 2020. Measurements taken of left upper extremity and compared to 2 weeks ago. Forearm and elbow and upper arm decreased greatly.  But hand and wrist increase.  Reviewed with the patient done incorrectly the handpiece.  And provided her with markers.  Then placed tributes sleeve overlapping about a centimeter.  With handpiece Markers of for patient to adjust correctly. Educated patient on donning it a easier way resting arm on table adjusting some of the Velcro lightly and then readjusting it correctly from the wrist up.  Patient daytime compression sleeve also from 2023.  Did not bring that in. Patient report pump still working. Patient in need for new daytime and nighttime compression sleeves to maintain her lymphedema on the left upper extremity and decrease cellulitis episodes Patient to do her pump morning and evening And wearing her tribute day and nighttime is much as she can with 1 compression bandage from wrist through the hand and circular 50% overlap up to axilla to provide extra compression. Patient awaiting to hear from [facility] medical to get measured and getting new nighttime and daytime compression.   PATIENT EDUCATION: Education details: findings of eval and HEP  Person educated: Patient Education method: Explanation, Demonstration, Tactile cues, Verbal cues, and Handouts Education comprehension: verbalized understanding, returned demonstration, verbal cues required, and needs further education    GOALS: Goals reviewed with patient? Yes   LONG TERM GOALS: Target date 12 wks   Patient to be fitted with new daytime and nighttime compression to maintain circumference and prevent future cellulitis episodes. Baseline: Patient's nighttime tribute sleeve still from 2019.  Velcro not working.  Not fitting patient well.  Daytime compression sleeve is from 2023.  Had Bell's palsy was not able to wear compression.  Left upper  extremity circumference increased Goal status: INITIAL  2.  Patient to be independent in home program of wearing correct compression and using pump to maintain her circumference in left upper extremity. Baseline: Patient's garments is older than 2019 and 2023.  Patient had Bell's palsy earlier this year.  Was unable to use garments.  Left upper extremity increased.  Goal status: INITIAL  ASSESSMENT:  CLINICAL IMPRESSION: Patient seen today for occupational therapy follow-up after being evaluated 2 weeks ago for left upper extremity lymphedema.  Patient was seen by this OT in 2019 for same diagnosis after breast cancer with episodes of cellulitis.  Patient was set up with home program and return to OT again in 2023 for new daytime compression.  Also had episode of cellulitis.  Patient returns 2 weeks ago after having Bell's palsy earlier this year.  Was not able to wear her compression.  Patient using a pump morning and evening 45 minutes to hour.  Patient's daytime custom [employer] soft sleeve is from 2023.  Tribute night sleeve is still from 2019.  Patient in need of new daytime and nighttime compression including glove for daytime and handpiece for nighttime.  Patient arrives with her ultra butte with power sleeves on.  Has been wearing her old tribute with 1 bandaging 50% over labs.  Forearm and elbow and upper arm decreased greatly.  But hand and wrist increased.  Patient to continue with home program and OT will reach out to [facility] medical about new compression sleeves and timeline.    Patient can benefit from skilled OT services to decrease lymphedema in the left upper extremity setting up her with the correct home program to be able to follow through and prevent future cellulitis episodes.  PERFORMANCE DEFICITS: in functional skills including ADLs, IADLs, ROM, strength, pain, flexibility, decreased knowledge of use of DME, and UE functional use,   and psychosocial skills including  environmental adaptation and routines and behaviors.   IMPAIRMENTS: are limiting patient from ADLs, IADLs, rest and sleep, play, leisure, and social participation.   COMORBIDITIES: has  Lymphedema as co-morbidities that affects occupational performance. Patient will benefit from skilled OT to address above impairments and improve overall function.  MODIFICATION OR ASSISTANCE TO COMPLETE EVALUATION: No modification of tasks or assist necessary to complete an evaluation.  OT OCCUPATIONAL PROFILE AND HISTORY: Problem focused assessment: Including review of records relating to presenting problem.  CLINICAL DECISION MAKING: LOW - limited treatment options, no task modification necessary  REHAB POTENTIAL: Good for goals  EVALUATION COMPLEXITY: Low      PLAN:  OT FREQUENCY: 6 visits  OT DURATION: 12 weeks  PLANNED INTERVENTIONS: 97168 OT Re-evaluation, 97535 self care/ADL training, 29562 therapeutic exercise, 97530 therapeutic activity, 97140 manual therapy, compression bandaging, patient/family education, and DME and/or AE instructions    CONSULTED AND AGREED WITH PLAN OF CARE: Patient     Oletta Cohn, OTR/L,CLT 10/26/2023, 8:45 AM

## 2023-11-04 ENCOUNTER — Ambulatory Visit: Payer: Medicare (Managed Care) | Admitting: Neurology

## 2023-11-11 ENCOUNTER — Ambulatory Visit: Payer: Medicare (Managed Care) | Admitting: Occupational Therapy

## 2023-11-11 DIAGNOSIS — I972 Postmastectomy lymphedema syndrome: Secondary | ICD-10-CM

## 2023-11-11 NOTE — Therapy (Signed)
 OUTPATIENT OCCUPATIONAL THERAPY LYMPHEDEMA TREATMENT  Patient Name: Maria Patterson MRN: [id number] DOB:[birth year], 83 y.o., female Today's Date: 11/11/2023  PCP: Dr Artemisa Bile PROVIDER: DR Sueanne Emerald  END OF SESSION:  OT End of Session - 11/11/23 1749     Visit Number 3    Number of Visits 6    Date for OT Re-Evaluation 01/03/24    OT Start Time 0823    OT Stop Time 0855    OT Time Calculation (min) 32 min    Activity Tolerance Patient tolerated treatment well    Behavior During Therapy [facility] for tasks assessed/performed             Past Medical History:  Diagnosis Date   Ache in joint 03/09/2016   Anemia    Anxiety and depression 05/12/2019   Breast cancer (HCC) 2018   Left   Breast mass, left 09/22/2016   RECOMMENDATION: Ultrasound-guided core biopsies of masses at left breast 2 o'clock 2 cm from nipple, left breast 2 o'clock 5 cm from nipple, abnormal left axillary lymph nodes.   Collapsed vertebra, not elsewhere classified, lumbar region, initial encounter for fracture (HCC) 05/04/2017   Formatting of this note might be different from the original.  Last Assessment & Plan:   Discussed finding on xray; will get DEXA scan and treat as indicated; 3 servings of calcium  a day, 1000 iu of vit D3 daily; fall precautions   Colon polyps 03/17/2019   Compression fracture of first lumbar vertebra (HCC) 05/04/2017   COPD (chronic obstructive pulmonary disease) (HCC)    Depression    Dermatitis 05/12/2019   GERD (gastroesophageal reflux disease)    Herpes 05/12/2019   History of breast cancer 02/04/2021   History of chickenpox    Hx of adenomatous colonic polyps    Hyperglycemia    Hyperlipidemia    Hypomagnesemia 05/04/2017   Insomnia 09/26/2017   Multiple renal cysts 05/15/2021   Obesity    Personal history of chemotherapy 2018   Left breast   Personal history of radiation therapy 2018   Postmenopausal    Postmenopausal 09/18/2016   Shingles    right eye    Spider  vein of lower extremity 12/20/2020   Tubular adenoma 01/18/2020   Past Surgical History:  Procedure Laterality Date   ABDOMINAL HYSTERECTOMY     AXILLARY SENTINEL NODE BIOPSY Left 04/14/2017   Procedure: AXILLARY SENTINEL NODE BIOPSY;  Surgeon: Emmalene Hare, MD;  Location: [facility] ORS;  Service: General;  Laterality: Left;   BREAST BIOPSY Left 09/30/2016   US  biopsy of 3 areas + chemo   BREAST EXCISIONAL BIOPSY Left 04/14/2017   Left breast invasive cancer left mastectomy   COLONOSCOPY WITH PROPOFOL  N/A 06/17/2020   Procedure: COLONOSCOPY WITH PROPOFOL ;  Surgeon: Toledo, Alphonsus Jeans, MD;  Location: [facility] ENDOSCOPY;  Service: Gastroenterology;  Laterality: N/A;   ESOPHAGOGASTRODUODENOSCOPY (EGD) WITH PROPOFOL  N/A 12/24/2014   Procedure: ESOPHAGOGASTRODUODENOSCOPY (EGD) WITH PROPOFOL ;  Surgeon: Cassie Click, MD;  Location: [facility] ENDOSCOPY;  Service: Endoscopy;  Laterality: N/A;   MASTECTOMY Left 04/14/2017   Left breast invasive cancer   PORTACATH PLACEMENT Right 10/27/2016   Procedure: INSERTION PORT-A-CATH;  Surgeon: Petra Brandy, MD;  Location: [facility] ORS;  Service: General;  Laterality: Right;   TONSILECTOMY, ADENOIDECTOMY, BILATERAL MYRINGOTOMY AND TUBES     TONSILLECTOMY     TOTAL MASTECTOMY Left 04/14/2017   Procedure: TOTAL MASTECTOMY;  Surgeon: Emmalene Hare, MD;  Location: [facility] ORS;  Service: General;  Laterality: Left;   Patient Active Problem  List   Diagnosis Date Noted   Bell's palsy 09/13/2023   Annual physical exam 04/19/2023   Acute pain of right shoulder 04/18/2023   Urinary tract infection without hematuria 02/17/2023   Candida infection 02/17/2023   Vitamin D  deficiency 07/24/2022   Iron deficiency 07/24/2022   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   Cervical arthritis 02/04/2021   Lymphedema of arm 09/18/2020   Chronic right shoulder pain 09/18/2020   Osteoporosis 05/12/2019   Pre-diabetes 02/19/2019   H/O total mastectomy of left breast 06/03/2018   Obesity  (BMI 30.0-34.9) 03/08/2018   Stage 3b chronic kidney disease (HCC) 08/09/2017   Aortic atherosclerosis (HCC) 05/04/2017   Degenerative disc disease, lumbar 05/04/2017   Anemia 02/01/2017   Atherosclerosis of native arteries of extremity with intermittent claudication (HCC) 11/09/2016   Hypertension 11/07/2016   Allergic rhinitis 11/07/2016   Malignant neoplasm of upper-outer quadrant of left female breast (HCC) 10/08/2016   Pain in joint 03/09/2016   Gastroesophageal reflux disease 07/01/2012   Hyperlipidemia 07/01/2012    ONSET DATE: 2019  REFERRING DIAG: L UE lymphedema  THERAPY DIAG:  Postmastectomy lymphedema syndrome  Rationale for Evaluation and Treatment: Rehabilitation  SUBJECTIVE:   SUBJECTIVE STATEMENT: I am getting so tired of this garments.  Wearing this monitor I am doing my pump.  I called the medical supply company again last week and this week and they still waiting for insurance.   Pt accompanied by: self  PERTINENT HISTORY: Lymphedema of arm Dr Sueanne Emerald visit note 09/24/23 Assessment & Plan: Increased swelling in the left arm over the past two months. No pain reported. History of lymphedema in the same arm. Low suspicion for VTE given chronic issue. -Order an ultrasound of the left arm to rule out clot. -If ultrasound is clear, refer to lymphedema clinic on [place].   Orders: -     VAS US  UPPER EXTREMITY VENOUS DUPLEX; Future -     Ambulatory referral to Physical Medicine Rehab   Bell's palsy Assessment & Plan: Numbness and lack of sensation on the left side of the face. No improvement in movement noted. -Referral to neurology has been sent. Follow-up with neurology is advised.     Chronic right shoulder pain Assessment & Plan: Complaints of aching in the right shoulder. -MRI has been ordered. Follow-up appointment is scheduled.  PRECAUTIONS: L UE lymphedema   WEIGHT BEARING RESTRICTIONSNo  PAIN:  Are you having pain? No  FALLS: Has  patient fallen in last 6 months?No  LIVING ENVIRONMENT: Lives with: lives with their family   PLOF: Patient was seen by this OT in 2019 and 2020 for left upper upper extremity lymphedema after cellulitis episodes.  Her last visit with me was in 2023 also after episodes cellulitis.  PATIENT GOALS: To get some new sleeves to help my lymphedema   OBJECTIVE:  Note: Objective measures were completed at Evaluation unless otherwise noted.  HAND DOMINANCE: Right  ADLs: Patient independent in ADLs  FUNCTIONAL OUTCOME MEASURES: To be assessed next session  UPPER EXTREMITY ROM: Active range of motion bilateral shoulders within functional limits. Per patient no issues or concerns     LYMPHEDEMA/ONCOLOGY QUESTIONNAIRE - 11/11/23 0001       Left Upper Extremity Lymphedema   15 cm Proximal to Olecranon Process 34 cm    10 cm Proximal to Olecranon Process 34 cm    Olecranon Process 28 cm    15 cm Proximal to Ulnar Styloid Process 28.8 cm  10 cm Proximal to Ulnar Styloid Process 25.7 cm    Just Proximal to Ulnar Styloid Process 19 cm    Across Hand at [facility] 20.8 cm           Measurements taken -patient compared to in the past ready for compression garments except wrist and hand increased See flowsheets    COGNITION: Overall cognitive status:  But shows some decreased memory with follow through with exercises and instructions from prior     TREATMENT DATE:11/11/23       I called [employer]/weekend this past week and they still waiting for insurance they said So I do not have any new sleeves yet.                                                                                                                      Patient arrived with a tribute night sleeve  Hand piece not on  Velcro not sticking anymore.  Was fitted on patient originally in 2020. Measurements taken of left upper extremity and compared to in the past Forearm to upper arm ready for measurement for  compression.  Compared to in the past but hand and wrist increase.  Reviewed with the patient done incorrectly the handpiece.  And provided her with markers.  Then placed tributes sleeve overlapping about a centimeter.  With handpiece Markers of for patient to adjust correctly. Educated patient on donning it a easier way resting arm on table adjusting some of the Velcro lightly and then readjusting it correctly from the wrist up.  Patient daytime compression sleeve also from 2023.  Did not bring that in. Patient report pump still working. Patient in need for new daytime and nighttime compression sleeves to maintain her lymphedema on the left upper extremity and decrease cellulitis episodes Patient to do her pump morning and evening And wearing her tribute day and nighttime is much as she can with 1 compression bandage from wrist through the hand and circular 50% overlap up to axilla to provide extra compression. Contact [employer] [facility] and talk with the owner.  He reports still waiting for [facility] approval because out of network. Patient awaiting to hear from [employer] [facility] to get measured and getting new nighttime and daytime compression.    PATIENT EDUCATION: Education details: findings of eval and HEP  Person educated: Patient Education method: Explanation, Demonstration, Tactile cues, Verbal cues, and Handouts Education comprehension: verbalized understanding, returned demonstration, verbal cues required, and needs further education    GOALS: Goals reviewed with patient? Yes   LONG TERM GOALS: Target date 12 wks   Patient to be fitted with new daytime and nighttime compression to maintain circumference and prevent future cellulitis episodes. Baseline: Patient's nighttime tribute sleeve still from 2019.  Velcro not working.  Not fitting patient well.  Daytime compression sleeve is from 2023.  Had Bell's palsy was not able to wear compression.  Left upper extremity circumference  increased Goal status: INITIAL  2.  Patient to be independent  in home program of wearing correct compression and using pump to maintain her circumference in left upper extremity. Baseline: Patient's garments is older than 2019 and 2023.  Patient had Bell's palsy earlier this year.  Was unable to use garments.  Left upper extremity increased.  Goal status: INITIAL  ASSESSMENT:  CLINICAL IMPRESSION: Patient seen today for occupational therapy follow-up after being evaluated 2 weeks ago for left upper extremity lymphedema.  Patient was seen by this OT in 2019 for same diagnosis after breast cancer with episodes of cellulitis.  Patient was set up with home program and return to OT again in 2023 for new daytime compression.  Also had episode of cellulitis.  Patient returns 4 weeks ago after having Bell's palsy earlier this year.  Was not able to wear her compression.  Patient using a pump morning and evening 45 minutes to hour.  Patient's daytime custom [employer] soft sleeve is from 2023.  Tribute night sleeve is still from 2019.  Patient in need of new daytime and nighttime compression including glove for daytime and handpiece for nighttime.  Patient has been wearing her old tribute with 1 bandaging 50% over lap the last 3 to 4 weeks.  Report did not hear back from insurance yet for approval for garments.  Patient measurements from forearm to upper arm is ready for measuring new compression garments but hand and wrist increased.  Patient to continue with home program and patient to contact me next week if she did not hear anything from [facility] about her new  compression sleeves and timeline.    Patient can benefit from skilled OT services to decrease lymphedema in the left upper extremity setting up her with the correct home program to be able to follow through and prevent future cellulitis episodes.  PERFORMANCE DEFICITS: in functional skills including ADLs, IADLs, ROM, strength, pain,  flexibility, decreased knowledge of use of DME, and UE functional use,   and psychosocial skills including environmental adaptation and routines and behaviors.   IMPAIRMENTS: are limiting patient from ADLs, IADLs, rest and sleep, play, leisure, and social participation.   COMORBIDITIES: has  Lymphedema as co-morbidities that affects occupational performance. Patient will benefit from skilled OT to address above impairments and improve overall function.  MODIFICATION OR ASSISTANCE TO COMPLETE EVALUATION: No modification of tasks or assist necessary to complete an evaluation.  OT OCCUPATIONAL PROFILE AND HISTORY: Problem focused assessment: Including review of records relating to presenting problem.  CLINICAL DECISION MAKING: LOW - limited treatment options, no task modification necessary  REHAB POTENTIAL: Good for goals  EVALUATION COMPLEXITY: Low      PLAN:  OT FREQUENCY: 6 visits  OT DURATION: 12 weeks  PLANNED INTERVENTIONS: 97168 OT Re-evaluation, 97535 self care/ADL training, 96045 therapeutic exercise, 97530 therapeutic activity, 97140 manual therapy, compression bandaging, patient/family education, and DME and/or AE instructions    CONSULTED AND AGREED WITH PLAN OF CARE: Patient     Heloise Lobo, OTR/L,CLT 11/11/2023, 5:50 PM

## 2023-11-16 DIAGNOSIS — I129 Hypertensive chronic kidney disease with stage 1 through stage 4 chronic kidney disease, or unspecified chronic kidney disease: Secondary | ICD-10-CM | POA: Diagnosis not present

## 2023-11-16 DIAGNOSIS — I10 Essential (primary) hypertension: Secondary | ICD-10-CM | POA: Diagnosis not present

## 2023-11-16 DIAGNOSIS — N1832 Chronic kidney disease, stage 3b: Secondary | ICD-10-CM | POA: Diagnosis not present

## 2023-11-16 DIAGNOSIS — N2581 Secondary hyperparathyroidism of renal origin: Secondary | ICD-10-CM | POA: Diagnosis not present

## 2023-11-16 DIAGNOSIS — N281 Cyst of kidney, acquired: Secondary | ICD-10-CM | POA: Diagnosis not present

## 2023-11-22 ENCOUNTER — Other Ambulatory Visit: Payer: Self-pay

## 2023-11-22 DIAGNOSIS — J3089 Other allergic rhinitis: Secondary | ICD-10-CM

## 2023-11-30 ENCOUNTER — Ambulatory Visit

## 2023-11-30 ENCOUNTER — Ambulatory Visit: Payer: Medicare (Managed Care) | Attending: Family Medicine | Admitting: Occupational Therapy

## 2023-11-30 DIAGNOSIS — I972 Postmastectomy lymphedema syndrome: Secondary | ICD-10-CM | POA: Insufficient documentation

## 2023-11-30 NOTE — Therapy (Signed)
 OUTPATIENT OCCUPATIONAL THERAPY LYMPHEDEMA TREATMENT  Patient Name: Maria Patterson MRN: [id number] DOB:01-03-1941, 83 y.o., female Today's Date: 11/30/2023  PCP: Dr Artemisa Bile PROVIDER: DR Sueanne Emerald  END OF SESSION:  OT End of Session - 11/30/23 0824     Visit Number 4    Number of Visits 6    Date for OT Re-Evaluation 01/03/24    OT Start Time 0821    OT Stop Time 0849    OT Time Calculation (min) 28 min    Activity Tolerance Patient tolerated treatment well    Behavior During Therapy [facility] for tasks assessed/performed             Past Medical History:  Diagnosis Date   Ache in joint 03/09/2016   Anemia    Anxiety and depression 05/12/2019   Breast cancer (HCC) 2018   Left   Breast mass, left 09/22/2016   RECOMMENDATION: Ultrasound-guided core biopsies of masses at left breast 2 o'clock 2 cm from nipple, left breast 2 o'clock 5 cm from nipple, abnormal left axillary lymph nodes.   Collapsed vertebra, not elsewhere classified, lumbar region, initial encounter for fracture (HCC) 05/04/2017   Formatting of this note might be different from the original.  Last Assessment & Plan:   Discussed finding on xray; will get DEXA scan and treat as indicated; 3 servings of calcium  a day, 1000 iu of vit D3 daily; fall precautions   Colon polyps 03/17/2019   Compression fracture of first lumbar vertebra (HCC) 05/04/2017   COPD (chronic obstructive pulmonary disease) (HCC)    Depression    Dermatitis 05/12/2019   GERD (gastroesophageal reflux disease)    Herpes 05/12/2019   History of breast cancer 02/04/2021   History of chickenpox    Hx of adenomatous colonic polyps    Hyperglycemia    Hyperlipidemia    Hypomagnesemia 05/04/2017   Insomnia 09/26/2017   Multiple renal cysts 05/15/2021   Obesity    Personal history of chemotherapy 2018   Left breast   Personal history of radiation therapy 2018   Postmenopausal    Postmenopausal 09/18/2016   Shingles    right eye    Spider  vein of lower extremity 12/20/2020   Tubular adenoma 01/18/2020   Past Surgical History:  Procedure Laterality Date   ABDOMINAL HYSTERECTOMY     AXILLARY SENTINEL NODE BIOPSY Left 04/14/2017   Procedure: AXILLARY SENTINEL NODE BIOPSY;  Surgeon: Emmalene Hare, MD;  Location: [facility] ORS;  Service: General;  Laterality: Left;   BREAST BIOPSY Left 09/30/2016   US  biopsy of 3 areas + chemo   BREAST EXCISIONAL BIOPSY Left 04/14/2017   Left breast invasive cancer left mastectomy   COLONOSCOPY WITH PROPOFOL  N/A 06/17/2020   Procedure: COLONOSCOPY WITH PROPOFOL ;  Surgeon: Toledo, Alphonsus Jeans, MD;  Location: [facility] ENDOSCOPY;  Service: Gastroenterology;  Laterality: N/A;   ESOPHAGOGASTRODUODENOSCOPY (EGD) WITH PROPOFOL  N/A 12/24/2014   Procedure: ESOPHAGOGASTRODUODENOSCOPY (EGD) WITH PROPOFOL ;  Surgeon: Cassie Click, MD;  Location: [facility] ENDOSCOPY;  Service: Endoscopy;  Laterality: N/A;   MASTECTOMY Left 04/14/2017   Left breast invasive cancer   PORTACATH PLACEMENT Right 10/27/2016   Procedure: INSERTION PORT-A-CATH;  Surgeon: Petra Brandy, MD;  Location: [facility] ORS;  Service: General;  Laterality: Right;   TONSILECTOMY, ADENOIDECTOMY, BILATERAL MYRINGOTOMY AND TUBES     TONSILLECTOMY     TOTAL MASTECTOMY Left 04/14/2017   Procedure: TOTAL MASTECTOMY;  Surgeon: Emmalene Hare, MD;  Location: [facility] ORS;  Service: General;  Laterality: Left;   Patient Active Problem  List   Diagnosis Date Noted   Bell's palsy 09/13/2023   Annual physical exam 04/19/2023   Acute pain of right shoulder 04/18/2023   Urinary tract infection without hematuria 02/17/2023   Candida infection 02/17/2023   Vitamin D  deficiency 07/24/2022   Iron deficiency 07/24/2022   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   Cervical arthritis 02/04/2021   Lymphedema of arm 09/18/2020   Chronic right shoulder pain 09/18/2020   Osteoporosis 05/12/2019   Pre-diabetes 02/19/2019   H/O total mastectomy of left breast 06/03/2018   Obesity  (BMI 30.0-34.9) 03/08/2018   Stage 3b chronic kidney disease (HCC) 08/09/2017   Aortic atherosclerosis (HCC) 05/04/2017   Degenerative disc disease, lumbar 05/04/2017   Anemia 02/01/2017   Atherosclerosis of native arteries of extremity with intermittent claudication (HCC) 11/09/2016   Hypertension 11/07/2016   Allergic rhinitis 11/07/2016   Malignant neoplasm of upper-outer quadrant of left female breast (HCC) 10/08/2016   Pain in joint 03/09/2016   Gastroesophageal reflux disease 07/01/2012   Hyperlipidemia 07/01/2012    ONSET DATE: 2019  REFERRING DIAG: L UE lymphedema  THERAPY DIAG:  Postmastectomy lymphedema syndrome  Rationale for Evaluation and Treatment: Rehabilitation  SUBJECTIVE:   SUBJECTIVE STATEMENT: I am getting so tired of this. They did measure me about 2 wks ago - I sometimes take the hand piece off in my sleep that is why my hand is up -but it goes down again when I wear my glove    Pt accompanied by: self  PERTINENT HISTORY: Lymphedema of arm Dr Sueanne Emerald visit note 09/24/23 Assessment & Plan: Increased swelling in the left arm over the past two months. No pain reported. History of lymphedema in the same arm. Low suspicion for VTE given chronic issue. -Order an ultrasound of the left arm to rule out clot. -If ultrasound is clear, refer to lymphedema clinic on [place].   Orders: -     VAS US  UPPER EXTREMITY VENOUS DUPLEX; Future -     Ambulatory referral to Physical Medicine Rehab   Bell's palsy Assessment & Plan: Numbness and lack of sensation on the left side of the face. No improvement in movement noted. -Referral to neurology has been sent. Follow-up with neurology is advised.     Chronic right shoulder pain Assessment & Plan: Complaints of aching in the right shoulder. -MRI has been ordered. Follow-up appointment is scheduled.  PRECAUTIONS: L UE lymphedema   WEIGHT BEARING RESTRICTIONSNo  PAIN:  Are you having pain? No  FALLS: Has  patient fallen in last 6 months?No  LIVING ENVIRONMENT: Lives with: lives with their family   PLOF: Patient was seen by this OT in 2019 and 2020 for left upper upper extremity lymphedema after cellulitis episodes.  Her last visit with me was in 2023 also after episodes cellulitis.  PATIENT GOALS: To get some new sleeves to help my lymphedema   OBJECTIVE:  Note: Objective measures were completed at Evaluation unless otherwise noted.  HAND DOMINANCE: Right  ADLs: Patient independent in ADLs  FUNCTIONAL OUTCOME MEASURES: To be assessed next session  UPPER EXTREMITY ROM: Active range of motion bilateral shoulders within functional limits. Per patient no issues or concerns     LYMPHEDEMA/ONCOLOGY QUESTIONNAIRE - 11/30/23 0001       Left Upper Extremity Lymphedema   15 cm Proximal to Olecranon Process 33 cm    10 cm Proximal to Olecranon Process 33.5 cm    Olecranon Process 28 cm    15 cm Proximal to  Ulnar Styloid Process 27.5 cm    10 cm Proximal to Ulnar Styloid Process 25 cm    Just Proximal to Ulnar Styloid Process 19.5 cm    Across Hand at [facility] 21.4 cm           Measurements taken great congestion since last time - OT contacted DME will check and let me know but garments were ordered    COGNITION: Overall cognitive status:  But shows some decreased memory with follow through with exercises and instructions from prior     TREATMENT DATE:11/11/23       Pt report she was measured about 2 wks ago                                                                                                                     Patient arrived with a tribute night sleeve off and glove with bandage in hand Hand piece not on - she takes it off during night sometimes per pt Velcro not sticking anymore.  Was fitted on patient originally in 2020. Measurements taken of left upper extremity and compared to in the past Forearm to upper arm decrease compare to last visit - hand  increase  See flowsheets  Reviewed with the patient again importance to wear as much as she can Tribute with bandaging on until fitted -pt not using markers provided by OT last time.   This date done her [employer] custom glove and tribute - but done bandaging 2 x over hand and wrist and then overlap up to Axilla   Patient daytime compression sleeve also from 2023.  Did not bring that in. Patient report pump still working. 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   They did measure her for garments and wear ordered - will let me know later when it is expected .    PATIENT EDUCATION: Education details: findings of eval and HEP  Person educated: Patient Education method: Explanation, Demonstration, Tactile cues, Verbal cues, and Handouts Education comprehension: verbalized understanding, returned demonstration, verbal cues required, and needs further education    GOALS: Goals reviewed with patient? Yes   LONG TERM GOALS: Target date 12 wks   Patient to be fitted with new daytime and nighttime compression to maintain circumference and prevent future cellulitis episodes. Baseline: Patient's nighttime tribute sleeve still from 2019.  Velcro not working.  Not fitting patient well.  Daytime compression sleeve is from 2023.  Had Bell's palsy was not able to wear compression.  Left upper extremity circumference increased Goal status: INITIAL  2.  Patient to be independent in home program of wearing correct compression and using pump to maintain her circumference in left upper extremity. Baseline: Patient's garments is older than 2019 and  2023.  Patient had Bell's palsy earlier this year.  Was unable to use garments.  Left upper  extremity increased.  Goal status: INITIAL  ASSESSMENT:  CLINICAL IMPRESSION: Patient seen today for occupational therapy follow-up after being evaluated 2 weeks ago for left upper extremity lymphedema.  Patient was seen by this OT in 2019 for same diagnosis after breast cancer with episodes of cellulitis.  Patient was set up with home program and return to OT again in 2023 for new daytime compression.  Also had episode of cellulitis.  Patient returns 4 weeks ago after having Bell's palsy earlier this year.  Was not able to wear her compression.  Patient using a pump morning and evening 45 minutes to hour.  Patient's daytime custom [employer] soft sleeve is from 2023.  Tribute night sleeve is still from 2019.  Patient in need of new daytime and nighttime compression including glove for daytime and handpiece for nighttime.  Patient has been wearing her old tribute with 1 bandaging 50% over lap the last 34-6 wks .  Pt was measured about 2 wks ago per pt for her garments -   Patient measurements from forearm to upper arm decrease greatly this past week or 2 -  hand increased but decrease after some MLD -   Pt to contact me when she gets fitted with her garments for me to assess fit and follow up with me 1-2 wks after wearing them -   Patient can benefit from skilled OT services to decrease lymphedema in the left upper extremity setting up her with the correct home program to be able to follow through and prevent future cellulitis episodes.  PERFORMANCE DEFICITS: in functional skills including ADLs, IADLs, ROM, strength, pain, flexibility, decreased knowledge of use of DME, and UE functional use,   and psychosocial skills including environmental adaptation and routines and behaviors.   IMPAIRMENTS: are limiting patient from ADLs, IADLs, rest and sleep, play, leisure, and social participation.   COMORBIDITIES: has  Lymphedema as co-morbidities that affects occupational performance. Patient will benefit  from skilled OT to address above impairments and improve overall function.  MODIFICATION OR ASSISTANCE TO COMPLETE EVALUATION: No modification of tasks or assist necessary to complete an evaluation.  OT OCCUPATIONAL PROFILE AND HISTORY: Problem focused assessment: Including review of records relating to presenting problem.  CLINICAL DECISION MAKING: LOW - limited treatment options, no task modification necessary  REHAB POTENTIAL: Good for goals  EVALUATION COMPLEXITY: Low      PLAN:  OT FREQUENCY: 6 visits  OT DURATION: 12 weeks  PLANNED INTERVENTIONS: 97168 OT Re-evaluation, 97535 self care/ADL training, 40981 therapeutic exercise, 97530 therapeutic activity, 97140 manual therapy, compression bandaging, patient/family education, and DME and/or AE instructions    CONSULTED AND AGREED WITH PLAN OF CARE: Patient     Heloise Lobo, OTR/L,CLT 11/30/2023, 8:50 AM

## 2023-12-21 ENCOUNTER — Other Ambulatory Visit: Payer: Self-pay | Admitting: Family Medicine

## 2023-12-21 DIAGNOSIS — M47812 Spondylosis without myelopathy or radiculopathy, cervical region: Secondary | ICD-10-CM

## 2023-12-22 NOTE — Telephone Encounter (Signed)
 Copied from CRM [id number]. Topic: Clinical - Medication Question >> Dec 22, 2023 10:41 AM Juleen Oakland F wrote: Reason for CRM: Patient returned [facility] phone call, says that she does need a refill on the Baclofen  medication sent to the [facility] pharmacy on file

## 2023-12-22 NOTE — Telephone Encounter (Signed)
 Left message to return call to our office.  Need to see if pt requested this medication or did the pharmacy just send it in.

## 2024-01-15 ENCOUNTER — Other Ambulatory Visit: Payer: Self-pay | Admitting: Family Medicine

## 2024-01-15 DIAGNOSIS — K219 Gastro-esophageal reflux disease without esophagitis: Secondary | ICD-10-CM

## 2024-03-06 ENCOUNTER — Ambulatory Visit: Payer: Self-pay

## 2024-03-06 ENCOUNTER — Ambulatory Visit: Payer: Medicare (Managed Care)

## 2024-03-06 ENCOUNTER — Ambulatory Visit: Payer: Medicare (Managed Care) | Admitting: Family Medicine

## 2024-03-06 VITALS — BP 122/62 | HR 60 | Temp 98.3°F | Resp 20 | Ht 61.0 in | Wt 173.5 lb

## 2024-03-06 DIAGNOSIS — I10 Essential (primary) hypertension: Secondary | ICD-10-CM | POA: Diagnosis not present

## 2024-03-06 DIAGNOSIS — I700 Atherosclerosis of aorta: Secondary | ICD-10-CM | POA: Diagnosis not present

## 2024-03-06 DIAGNOSIS — G8929 Other chronic pain: Secondary | ICD-10-CM

## 2024-03-06 DIAGNOSIS — G510 Bell's palsy: Secondary | ICD-10-CM | POA: Diagnosis not present

## 2024-03-06 DIAGNOSIS — N1832 Chronic kidney disease, stage 3b: Secondary | ICD-10-CM

## 2024-03-06 DIAGNOSIS — M25511 Pain in right shoulder: Secondary | ICD-10-CM

## 2024-03-06 DIAGNOSIS — I2510 Atherosclerotic heart disease of native coronary artery without angina pectoris: Secondary | ICD-10-CM

## 2024-03-06 DIAGNOSIS — E66811 Obesity, class 1: Secondary | ICD-10-CM

## 2024-03-06 DIAGNOSIS — J301 Allergic rhinitis due to pollen: Secondary | ICD-10-CM | POA: Diagnosis not present

## 2024-03-06 DIAGNOSIS — K219 Gastro-esophageal reflux disease without esophagitis: Secondary | ICD-10-CM

## 2024-03-06 DIAGNOSIS — E782 Mixed hyperlipidemia: Secondary | ICD-10-CM | POA: Diagnosis not present

## 2024-03-06 DIAGNOSIS — H04123 Dry eye syndrome of bilateral lacrimal glands: Secondary | ICD-10-CM | POA: Insufficient documentation

## 2024-03-06 DIAGNOSIS — E611 Iron deficiency: Secondary | ICD-10-CM

## 2024-03-06 DIAGNOSIS — Z23 Encounter for immunization: Secondary | ICD-10-CM | POA: Insufficient documentation

## 2024-03-06 DIAGNOSIS — E785 Hyperlipidemia, unspecified: Secondary | ICD-10-CM

## 2024-03-06 DIAGNOSIS — C50412 Malignant neoplasm of upper-outer quadrant of left female breast: Secondary | ICD-10-CM

## 2024-03-06 DIAGNOSIS — R7303 Prediabetes: Secondary | ICD-10-CM

## 2024-03-06 LAB — COMPREHENSIVE METABOLIC PANEL WITH GFR
ALT: 13 U/L (ref 0–35)
AST: 17 U/L (ref 0–37)
Albumin: 3.9 g/dL (ref 3.5–5.2)
Alkaline Phosphatase: 79 U/L (ref 39–117)
BUN: 24 mg/dL — ABNORMAL HIGH (ref 6–23)
CO2: 25 meq/L (ref 19–32)
Calcium: 9.1 mg/dL (ref 8.4–10.5)
Chloride: 112 meq/L (ref 96–112)
Creatinine, Ser: 1.37 mg/dL — ABNORMAL HIGH (ref 0.40–1.20)
GFR: 35.42 mL/min — ABNORMAL LOW (ref >60.00)
Glucose, Bld: 84 mg/dL (ref 70–99)
Potassium: 4.7 meq/L (ref 3.5–5.1)
Sodium: 145 meq/L (ref 135–145)
Total Bilirubin: 0.4 mg/dL (ref 0.2–1.2)
Total Protein: 6.3 g/dL (ref 6.0–8.3)

## 2024-03-06 LAB — VITAMIN B12: Vitamin B-12: 353 pg/mL (ref 211–911)

## 2024-03-06 LAB — HEMOGLOBIN A1C: Hgb A1c MFr Bld: 5.7 % (ref 4.6–6.5)

## 2024-03-06 LAB — LIPID PANEL
Cholesterol: 275 mg/dL — ABNORMAL HIGH (ref 0–200)
HDL: 66.6 mg/dL (ref >39.00)
LDL Cholesterol: 189 mg/dL — ABNORMAL HIGH (ref 0–99)
NonHDL: 207.97
Total CHOL/HDL Ratio: 4
Triglycerides: 94 mg/dL (ref 0.0–149.0)
VLDL: 18.8 mg/dL (ref 0.0–40.0)

## 2024-03-06 MED ORDER — NEBIVOLOL HCL 2.5 MG PO TABS: 2.5000 mg | ORAL_TABLET | Freq: Every day | ORAL | 3 refills | Status: AC

## 2024-03-06 MED ORDER — ROSUVASTATIN CALCIUM 20 MG PO TABS: 20.0000 mg | ORAL_TABLET | Freq: Every day | ORAL | 3 refills | Status: AC

## 2024-03-06 MED ORDER — LOSARTAN POTASSIUM 25 MG PO TABS: 25.0000 mg | ORAL_TABLET | Freq: Every day | ORAL | 3 refills | Status: AC

## 2024-03-06 MED ORDER — SYSTANE 0.4-0.3 % OP SOLN: 1.0000 [drp] | Freq: Every day | OPHTHALMIC | 3 refills | Status: AC | PRN

## 2024-03-06 MED ORDER — PANTOPRAZOLE SODIUM 40 MG PO TBEC: 40.0000 mg | DELAYED_RELEASE_TABLET | Freq: Every day | ORAL | 0 refills | Status: AC | PRN

## 2024-03-06 NOTE — Assessment & Plan Note (Signed)
 Blood pressure stable on current treatment regimen with Nebivolol  2.5 mg once a day, losartan  25 mg once a day.  Refill sent.  Check CMP today.

## 2024-03-06 NOTE — Assessment & Plan Note (Signed)
-   Symptoms tolerable.  She has developed weakness, feeling tired when taking baclofen .  Recommend we discontinue baclofen .  Patient agrees. - Use Voltaren  gel, up to four times a day on both knees, shoulder.  - You can take Tylenol  500 mg, every 8 hourly as needed for pain.  - Continue to use icy/hot as needed. You cal also try heating pad/cooling pad whichever helps.  - Discussed physical therapy referral, sports or Ortho referral if pain persist.  Patient declines PT referral at this time.

## 2024-03-06 NOTE — Assessment & Plan Note (Signed)
 Symptom has been stable with Protonix  40 mg which she only takes as needed.  Discussed potential side effects related to chronic use of Protonix  including worsening CKD.  Patient verbalizes understanding and she will only take this daily as needed. Check B 12 today.

## 2024-03-06 NOTE — Assessment & Plan Note (Signed)
 Continue nasal Flonase  one puff in each nostril daily. You can continue to use nasal saline spray, twice a day as needed. If this does not help you can add over the counter antihistamine medications like Zyrtec 5 mg or Allegra 180mg  or Xyzal  5mg  daily as need when allergy symptoms are worse. If symptoms persists despite above measures recommend patient reach out to our office and we will consider ENT referral at that time.

## 2024-03-06 NOTE — Assessment & Plan Note (Signed)
 Discussed regular exercise including moderate intensity cardiovascular exercise, strengthening exercise, balanced diet to help improve overall health.

## 2024-03-06 NOTE — Assessment & Plan Note (Signed)
 Was in hospital for Bell's palsy involving left side of face.  She was referred to neurology in 09/09/2023.  She has not seen a neurologist since then.  She does not have new or worsening symptoms but reports of bilateral dry eyes.  I recommend she use Systane eyedrop 1 drop in each eye daily to help with dry eyes.  I also recommend patient schedule an appointment with her ophthalmologist.  If patient develops facial weakness, numbness, reduced sensation, worsening dry eyes I recommend new referral to neurology.

## 2024-03-06 NOTE — Assessment & Plan Note (Signed)
 Continue Crestor  20 mg daily, aspirin 81 mg daily.  Risk factor modification including blood pressure control, regular exercise, healthy balanced diet recommended.

## 2024-03-06 NOTE — Assessment & Plan Note (Signed)
 Likely multifactorial, including h/o bell's palsy.  Defer to plan per Bell's palsy.

## 2024-03-06 NOTE — Assessment & Plan Note (Signed)
 Check CMP today.  Counseled on avoiding nephrotoxic medication.  Continue follow-up with nephrologist Dr. Dennise.

## 2024-03-06 NOTE — Assessment & Plan Note (Signed)
Check lipid panel today  Continue rosuvastatin 20mg daily

## 2024-03-06 NOTE — Progress Notes (Signed)
 Please let the patient know her labs from today shows her to slightly worsened kidney function from baseline. I recommend repeat kidney function in 1 week to make sure it's not getting further worse. I also recommend reducing daily salt intake to less than 2gm/day. Cholesterol shows elevated bad cholesterol or LDL, please continue to take the Rosuvastatin  20 mg daily to help improve cholesterol. Her A1c, B12 looks stable. She can come in for lab only appointment in 1 week for repeat kidney function test.   Thank you,  Luke Shade, MD

## 2024-03-06 NOTE — Progress Notes (Signed)
 Established Patient Office Visit TOC from Dr. Hope   Subjective  Patient ID: Maria Patterson, female    DOB: [birth year]  Age: 83 y.o. MRN: [id number]  Chief Complaint  Patient presents with   Establish Care    Transferring from Dr. Hope     She  has a past medical history of Ache in joint (03/09/2016), Acute pain of right shoulder (04/18/2023), Anemia, Anxiety and depression (05/12/2019), Breast cancer (HCC) (2018), Breast mass, left (09/22/2016), Collapsed vertebra, not elsewhere classified, lumbar region, initial encounter for fracture (HCC) (05/04/2017), Colon polyps (03/17/2019), Compression fracture of first lumbar vertebra (HCC) (05/04/2017), COPD (chronic obstructive pulmonary disease) (HCC), Depression, Dermatitis (05/12/2019), GERD (gastroesophageal reflux disease), Herpes (05/12/2019), History of breast cancer (02/04/2021), History of chickenpox, adenomatous colonic polyps, Hyperglycemia, Hyperlipidemia, Hypomagnesemia (05/04/2017), Insomnia (09/26/2017), Multiple renal cysts (05/15/2021), Obesity, Personal history of chemotherapy (2018), Personal history of radiation therapy (2018), Postmenopausal, Postmenopausal (09/18/2016), Shingles, Spider vein of lower extremity (12/20/2020), and Tubular adenoma (01/18/2020).  HPI Discussed the use of AI scribe software for clinical note transcription with the patient, who gave verbal consent to proceed.  History of Present Illness Maria Patterson is an 83 year old female who presents for medication review and management of her symptoms.  She experiences chronic pain in her right shoulder and knees, managed with baclofen  and Tylenol . The patient reports that baclofen  makes her sleepy and numb. She takes Tylenol  about twice a week, three tablets at a time, which she finds helpful. She has not engaged in physical therapy for her joint pain.  She has a history of Bell's palsy, which began in February of this year, causing facial muscle weakness  and soreness in her nasal area. She uses Systane eye drops for dry eyes, which she believes are related to Bell's palsy. She was diagnosed with Bell's palsy in January, but does not recall being given any medication for it.  She experiences seasonal allergies and uses azelastine  nasal spray and saline nasal spray. She reports soreness and dryness in her nasal area, which she attributes to her allergies and possibly Bell's palsy.  She takes a multivitamin, vitamin D , and iron supplements a few times a week. She is on nebivolol  for blood pressure, which she takes in the morning, and Protonix  as needed for acid reflux. No unusual chest pain or headaches.  She had shingles about eleven years ago and has not received the shingles vaccine. She has a history of mastectomy of the left breast and follows up with an oncologist regularly. She had a mammogram in March.  She has not seen a dentist in many years and reports irritation in her mouth when eating salads, which she attributes to her dental work.  ROS As per HPI    Objective:     BP 122/62   Pulse 60   Temp 98.3 F (36.8 C)   Resp 20   Ht 5' 1 (1.549 m)   Wt 173 lb 8 oz (78.7 kg)   SpO2 99%   BMI 32.78 kg/m      04/12/2023    8:54 AM 01/19/2023    1:38 PM 11/09/2022    8:05 AM  Depression screen PHQ 2/9  Decreased Interest 0 0 0  Down, Depressed, Hopeless 0 0 0  PHQ - 2 Score 0 0 0  Altered sleeping 0 0 0  Tired, decreased energy 0 0 0  Change in appetite 0 0 0  Feeling bad or failure about yourself  0 0 0  Trouble concentrating  0 0 0  Moving slowly or fidgety/restless 0 0 0  Suicidal thoughts 0 0 0  PHQ-9 Score 0 0 0  Difficult doing work/chores Not difficult at all Not difficult at all Not difficult at all      04/12/2023    8:55 AM 01/19/2023    1:38 PM 11/09/2022    8:05 AM 01/18/2020    8:37 AM  GAD 7 : Generalized Anxiety Score  Nervous, Anxious, on Edge 0 0 0 0  Control/stop worrying 0 0 0 0  Worry too much -  different things 0 0 0 0  Trouble relaxing 0 0 0 0  Restless 0 0 0 0  Easily annoyed or irritable 0 0 0 0  Afraid - awful might happen 0 0 0 0  Total GAD 7 Score 0 0 0 0  Anxiety Difficulty Not difficult at all Not difficult at all Not difficult at all Not difficult at all      04/12/2023    8:54 AM 01/19/2023    1:38 PM 11/09/2022    8:05 AM  Depression screen PHQ 2/9  Decreased Interest 0 0 0  Down, Depressed, Hopeless 0 0 0  PHQ - 2 Score 0 0 0  Altered sleeping 0 0 0  Tired, decreased energy 0 0 0  Change in appetite 0 0 0  Feeling bad or failure about yourself  0 0 0  Trouble concentrating 0 0 0  Moving slowly or fidgety/restless 0 0 0  Suicidal thoughts 0 0 0  PHQ-9 Score 0 0 0  Difficult doing work/chores Not difficult at all Not difficult at all Not difficult at all      04/12/2023    8:55 AM 01/19/2023    1:38 PM 11/09/2022    8:05 AM 01/18/2020    8:37 AM  GAD 7 : Generalized Anxiety Score  Nervous, Anxious, on Edge 0 0 0 0  Control/stop worrying 0 0 0 0  Worry too much - different things 0 0 0 0  Trouble relaxing 0 0 0 0  Restless 0 0 0 0  Easily annoyed or irritable 0 0 0 0  Afraid - awful might happen 0 0 0 0  Total GAD 7 Score 0 0 0 0  Anxiety Difficulty Not difficult at all Not difficult at all Not difficult at all Not difficult at all   SDOH Screenings   Food Insecurity: No Food Insecurity (10/29/2022)  Housing: Low Risk  (10/29/2022)  Transportation Needs: No Transportation Needs (10/29/2022)  Utilities: Not At Risk (10/29/2022)  Alcohol Screen: Low Risk  (03/06/2019)  Depression (PHQ2-9): Low Risk  (04/12/2023)  Financial Resource Strain: Low Risk  (10/29/2022)  Physical Activity: Insufficiently Active (10/29/2022)  Social Connections: Moderately Isolated (10/29/2022)  Stress: No Stress Concern Present (10/29/2022)  Tobacco Use: Low Risk  (03/06/2024)     Physical Exam Constitutional:      Appearance: She is obese.  HENT:     Head: Normocephalic and  atraumatic.     Right Ear: Tympanic membrane normal.     Left Ear: Tympanic membrane normal.     Mouth/Throat:     Mouth: Mucous membranes are moist.  Eyes:     Conjunctiva/sclera: Conjunctivae normal.     Pupils: Pupils are equal, round, and reactive to light.     Comments: Left eye: Clear, watery discharge suggestive of tearing. Conjunctiva normal without injection.  Cardiovascular:     Rate and Rhythm: Normal rate.  Abdominal:  Palpations: Abdomen is soft.     Tenderness: There is no guarding.  Musculoskeletal:     Cervical back: Neck supple. No tenderness.     Right lower leg: No edema.     Left lower leg: No edema.  Lymphadenopathy:     Cervical: No cervical adenopathy.  Neurological:     Mental Status: She is alert and oriented to person, place, and time.     Motor: No weakness.  Psychiatric:        Mood and Affect: Mood normal.        Behavior: Behavior normal.        No results found for any visits on 03/06/24.  The ASCVD Risk score (Arnett DK, et al., 2019) failed to calculate for the following reasons:   The 2019 ASCVD risk score is only valid for ages 15 to 6     Assessment & Plan:   Mixed hyperlipidemia Assessment & Plan: Check lipid panel today.  Continue rosuvastatin  20 mg daily.  Orders: -     Rosuvastatin  Calcium ; Take 1 tablet (20 mg total) by mouth daily. D/c lipitor 40  Dispense: 90 tablet; Refill: 3  Primary hypertension Assessment & Plan: Blood pressure stable on current treatment regimen with Nebivolol  2.5 mg once a day, losartan  25 mg once a day.  Refill sent.  Check CMP today.  Orders: -     Losartan  Potassium; Take 1 tablet (25 mg total) by mouth daily.  Dispense: 90 tablet; Refill: 3 -     Nebivolol  HCl; Take 1 tablet (2.5 mg total) by mouth daily.  Dispense: 90 tablet; Refill: 3 -     Comprehensive metabolic panel with GFR  Coronary artery disease involving native coronary artery of native heart without angina pectoris -      Nebivolol  HCl; Take 1 tablet (2.5 mg total) by mouth daily.  Dispense: 90 tablet; Refill: 3  Gastroesophageal reflux disease, unspecified whether esophagitis present Assessment & Plan: Symptom has been stable with Protonix  40 mg which she only takes as needed.  Discussed potential side effects related to chronic use of Protonix  including worsening CKD.  Patient verbalizes understanding and she will only take this daily as needed. Check B 12 today.  Orders: -     Pantoprazole  Sodium; Take 1 tablet (40 mg total) by mouth daily as needed.  Dispense: 90 tablet; Refill: 0 -     Vitamin B12  Aortic atherosclerosis (HCC) Assessment & Plan: Continue Crestor  20 mg daily, aspirin 81 mg daily.  Risk factor modification including blood pressure control, regular exercise, healthy balanced diet recommended.   Orders: -     Lipid panel  Pre-diabetes Assessment & Plan: Check A1c today.  Orders: -     Hemoglobin A1c  Dry eyes, bilateral Assessment & Plan: Likely multifactorial, including h/o bell's palsy.  Defer to plan per Bell's palsy.   Orders: -     Systane; Place 1 drop into both eyes daily as needed (dry eyes).  Dispense: 10 mL; Refill: 3  Malignant neoplasm of upper-outer quadrant of left breast in female, estrogen receptor negative (HCC)  Non-seasonal allergic rhinitis due to pollen Assessment & Plan: Continue nasal Flonase  one puff in each nostril daily. You can continue to use nasal saline spray, twice a day as needed. If this does not help you can add over the counter antihistamine medications like Zyrtec 5 mg or Allegra 180mg  or Xyzal  5mg  daily as need when allergy symptoms are worse. If symptoms persists despite  above measures recommend patient reach out to our office and we will consider ENT referral at that time.   Bell's palsy Assessment & Plan: Was in hospital for Bell's palsy involving left side of face.  She was referred to neurology in 09/09/2023.  She has not seen a  neurologist since then.  She does not have new or worsening symptoms but reports of bilateral dry eyes.  I recommend she use Systane eyedrop 1 drop in each eye daily to help with dry eyes.  I also recommend patient schedule an appointment with her ophthalmologist.  If patient develops facial weakness, numbness, reduced sensation, worsening dry eyes I recommend new referral to neurology.    Chronic right shoulder pain Assessment & Plan: - Symptoms tolerable.  She has developed weakness, feeling tired when taking baclofen .  Recommend we discontinue baclofen .  Patient agrees. - Use Voltaren  gel, up to four times a day on both knees, shoulder.  - You can take Tylenol  500 mg, every 8 hourly as needed for pain.  - Continue to use icy/hot as needed. You cal also try heating pad/cooling pad whichever helps.  - Discussed physical therapy referral, sports or Ortho referral if pain persist.  Patient declines PT referral at this time.   Obesity (BMI 30.0-34.9) Assessment & Plan: Discussed regular exercise including moderate intensity cardiovascular exercise, strengthening exercise, balanced diet to help improve overall health.    Chronic pain of both knees Assessment & Plan: - Symptoms tolerable.  She has developed weakness, feeling tired when taking baclofen .  Recommend we discontinue baclofen .  Patient agrees. - Use Voltaren  gel, up to four times a day on both knees, shoulder.  - You can take Tylenol  500 mg, every 8 hourly as needed for pain.  - Continue to use icy/hot as needed. You cal also try heating pad/cooling pad whichever helps.  - Discussed physical therapy referral, sports or Ortho referral if pain persist.  Patient declines PT referral at this time.   Need for shingles vaccine Assessment & Plan: If you are interested in the shingles vaccine series (Shingrix), call your insurance or pharmacy to check on coverage and location it must be given.  If affordable - you can schedule it here or  at your pharmacy depending on coverage. It is a two dose vaccine, you get one now and second dose 2-6 months from the first dose.    I personally spent a total of 50 minutes in the care of the patient today including preparing to see the patient, performing a medically appropriate exam/evaluation, counseling and educating, placing orders, documenting clinical information in the EHR, independently interpreting results, and communicating results.   Return in about 4 months (around 07/06/2024) for Chronic follow up .   Luke Shade, MD

## 2024-03-06 NOTE — Assessment & Plan Note (Signed)
Continues to follow up with heme/onc.

## 2024-03-06 NOTE — Assessment & Plan Note (Signed)
 Check A1c today.

## 2024-03-06 NOTE — Patient Instructions (Addendum)
-   Use Voltaren  gel, up to four times a day on both knees, shoulder.  - You can take Tylenol  500 mg, every 8 hourly as needed for pain.  - Continue to use icy/hot as needed. You cal also try heating pad/cooling pad whichever helps.  - I am discontinuing Baclofen  as this can increase risk of dizziness, feeling tired, increase risk of fall.     ---------- For allergy: Continue nasal daily Flonase  one puff in each nostril daily. You can continue to use nasal saline spray, twice a day as needed. If this does not help you can add over the counter antihistamine medication like Zyrtec 5 mg or Allegra 180mg  or Xyzal  5mg  daily as need when allergy symptoms are worse.  ----  For acid reflux:  Take Protonix  40 mg, on empty stomach, 1 tablet as needed when acid reflux problem is worse. Be careful with taking this medication everyday unless it is absolutely necessary as it can cause kidney problem,   ---- I recommend follow up with your eye doctor once a year or sooner if you start to have burning, stinging sensation or change in vision in your eyes. I am sending prescription for Systane eye drop, put one drop in each eye as needed for dry eyes.   ---  If you are interested in the shingles vaccine series (Shingrix), call your insurance or pharmacy to check on coverage and location it must be given.  If affordable - you can schedule it here or at your pharmacy depending on coverage. It is a two dose vaccine, you get one now and second dose 2-6 months from the first dose.  ---  Please check with your insurance to see which dentist you can go to and schedule a dental appointment.

## 2024-03-06 NOTE — Assessment & Plan Note (Signed)
 If you are interested in the shingles vaccine series (Shingrix), call your insurance or pharmacy to check on coverage and location it must be given.  If affordable - you can schedule it here or at your pharmacy depending on coverage. It is a two dose vaccine, you get one now and second dose 2-6 months from the first dose.

## 2024-03-16 ENCOUNTER — Other Ambulatory Visit (INDEPENDENT_AMBULATORY_CARE_PROVIDER_SITE_OTHER): Payer: Medicare (Managed Care)

## 2024-03-16 DIAGNOSIS — N1832 Chronic kidney disease, stage 3b: Secondary | ICD-10-CM | POA: Diagnosis not present

## 2024-03-16 LAB — BASIC METABOLIC PANEL WITH GFR
BUN: 27 mg/dL — ABNORMAL HIGH (ref 6–23)
CO2: 22 meq/L (ref 19–32)
Calcium: 9.9 mg/dL (ref 8.4–10.5)
Chloride: 113 meq/L — ABNORMAL HIGH (ref 96–112)
Creatinine, Ser: 1.39 mg/dL — ABNORMAL HIGH (ref 0.40–1.20)
GFR: 34.8 mL/min — ABNORMAL LOW (ref >60.00)
Glucose, Bld: 87 mg/dL (ref 70–99)
Potassium: 4.4 meq/L (ref 3.5–5.1)
Sodium: 145 meq/L (ref 135–145)

## 2024-03-17 ENCOUNTER — Ambulatory Visit: Payer: Self-pay

## 2024-05-17 ENCOUNTER — Ambulatory Visit (INDEPENDENT_AMBULATORY_CARE_PROVIDER_SITE_OTHER): Payer: Medicare (Managed Care) | Admitting: *Deleted

## 2024-05-17 VITALS — Ht 61.0 in | Wt 173.0 lb

## 2024-05-17 DIAGNOSIS — Z0000 Encounter for general adult medical examination without abnormal findings: Secondary | ICD-10-CM

## 2024-05-17 NOTE — Progress Notes (Signed)
 Subjective:   Maria Patterson is a 83 y.o. who presents for a Medicare Wellness preventive visit.  As a reminder, Annual Wellness Visits don't include a physical exam, and some assessments may be limited, especially if this visit is performed virtually. We may recommend an in-person follow-up visit with your provider if needed.  Visit Complete: Virtual I connected with  Maria Patterson on 05/17/24 by a audio enabled telemedicine application and verified that I am speaking with the correct person using two identifiers.  Patient Location: Home  Provider Location: Home Office  I discussed the limitations of evaluation and management by telemedicine. The patient expressed understanding and agreed to proceed.  Vital Signs: Because this visit was a virtual/telehealth visit, some criteria may be missing or patient reported. Any vitals not documented were not able to be obtained and vitals that have been documented are patient reported.  VideoDeclined- This patient declined Librarian, academic. Therefore the visit was completed with audio only.  Persons Participating in Visit: Patient.  AWV Questionnaire: No: Patient Medicare AWV questionnaire was not completed prior to this visit.  Cardiac Risk Factors include: advanced age (>37men, >34 women);dyslipidemia;hypertension;obesity (BMI >30kg/m2);Other (see comment), Risk factor comments: CAD     Objective:    Today's Vitals   05/17/24 1012  Weight: 173 lb (78.5 kg)  Height: 5' 1 (1.549 m)   Body mass index is 32.69 kg/m.     05/17/2024   10:21 AM 08/14/2023   12:41 PM 12/30/2022    3:00 AM 10/29/2022    1:39 PM 05/01/2022    4:36 PM 10/06/2021    2:09 PM 06/17/2020    9:46 AM  Advanced Directives  Does Patient Have a Medical Advance Directive? No No No No No No No  Would patient like information on creating a medical advance directive? No - Patient declined  No - Patient declined No - Patient declined No -  Patient declined No - Patient declined     Current Medications (verified) Outpatient Encounter Medications as of 05/17/2024  Medication Sig   acetaminophen  (TYLENOL ) 325 MG tablet Take 2 tablets (650 mg total) by mouth every 6 (six) hours as needed.   aspirin 81 MG tablet Take 81 mg by mouth. Takes every 3-4 days.   Cholecalciferol (VITAMIN D ) 2000 units tablet Take 2,000 Units by mouth 3 (three) times a week.    ferrous sulfate  325 (65 FE) MG EC tablet Take 325 mg by mouth 3 (three) times a week.    fluticasone  (FLONASE ) 50 MCG/ACT nasal spray Place 2 sprays into both nostrils 2 (two) times daily.   losartan  (COZAAR ) 25 MG tablet Take 1 tablet (25 mg total) by mouth daily.   Multiple Vitamin (MULTIVITAMIN WITH MINERALS) TABS tablet Take 1 tablet by mouth 3 (three) times a week.    nebivolol  (BYSTOLIC ) 2.5 MG tablet Take 1 tablet (2.5 mg total) by mouth daily.   pantoprazole  (PROTONIX ) 40 MG tablet Take 1 tablet (40 mg total) by mouth daily as needed.   Polyethyl Glycol-Propyl Glycol (SYSTANE) 0.4-0.3 % SOLN Place 1 drop into both eyes daily as needed (dry eyes).   rosuvastatin  (CRESTOR ) 20 MG tablet Take 1 tablet (20 mg total) by mouth daily. D/c lipitor 40   sodium chloride  (OCEAN) 0.65 % SOLN nasal spray Place 1 spray into both nostrils as needed for congestion.   valACYclovir  (VALTREX ) 1000 MG tablet Take 1,000 mg by mouth as needed.   No facility-administered encounter medications on file as of  05/17/2024.    Allergies (verified) Latex and Penicillins   History: Past Medical History:  Diagnosis Date   Ache in joint 03/09/2016   Acute pain of right shoulder 04/18/2023   Anemia    Anxiety and depression 05/12/2019   Breast cancer (HCC) 2018   Left   Breast mass, left 09/22/2016   RECOMMENDATION: Ultrasound-guided core biopsies of masses at left breast 2 o'clock 2 cm from nipple, left breast 2 o'clock 5 cm from nipple, abnormal left axillary lymph nodes.   Collapsed vertebra,  not elsewhere classified, lumbar region, initial encounter for fracture (HCC) 05/04/2017   Formatting of this note might be different from the original.  Last Assessment & Plan:   Discussed finding on xray; will get DEXA scan and treat as indicated; 3 servings of calcium  a day, 1000 iu of vit D3 daily; fall precautions   Colon polyps 03/17/2019   Compression fracture of first lumbar vertebra (HCC) 05/04/2017   COPD (chronic obstructive pulmonary disease) (HCC)    Depression    Dermatitis 05/12/2019   GERD (gastroesophageal reflux disease)    Herpes 05/12/2019   History of breast cancer 02/04/2021   History of chickenpox    Hx of adenomatous colonic polyps    Hyperglycemia    Hyperlipidemia    Hypomagnesemia 05/04/2017   Insomnia 09/26/2017   Multiple renal cysts 05/15/2021   Obesity    Personal history of chemotherapy 2018   Left breast   Personal history of radiation therapy 2018   Postmenopausal    Postmenopausal 09/18/2016   Shingles    right eye    Spider vein of lower extremity 12/20/2020   Tubular adenoma 01/18/2020   Past Surgical History:  Procedure Laterality Date   ABDOMINAL HYSTERECTOMY     AXILLARY SENTINEL NODE BIOPSY Left 04/14/2017   Procedure: AXILLARY SENTINEL NODE BIOPSY;  Surgeon: Desiderio Schanz, MD;  Location: [facility] ORS;  Service: General;  Laterality: Left;   BREAST BIOPSY Left 09/30/2016   US  biopsy of 3 areas + chemo   BREAST EXCISIONAL BIOPSY Left 04/14/2017   Left breast invasive cancer left mastectomy   COLONOSCOPY WITH PROPOFOL  N/A 06/17/2020   Procedure: COLONOSCOPY WITH PROPOFOL ;  Surgeon: Toledo, Ladell POUR, MD;  Location: [facility] END[facility];  Service: Gastroenterology;  Laterality: N/A;   ESOPHAGOGASTRODUODENOSCOPY (EGD) WITH PROPOFOL  N/A 12/24/2014   Procedure: ESOPHAGOGASTRODUODENOSCOPY (EGD) WITH PROPOFOL ;  Surgeon: Lamar ONEIDA Holmes, MD;  Location: [facility] END[facility];  Service: Endoscopy;  Laterality: N/A;   MASTECTOMY Left 04/14/2017   Left breast  invasive cancer   PORTACATH PLACEMENT Right 10/27/2016   Procedure: INSERTION PORT-A-CATH;  Surgeon: Evalene Glasser, MD;  Location: [facility] ORS;  Service: General;  Laterality: Right;   TONSILECTOMY, ADENOIDECTOMY, BILATERAL MYRINGOTOMY AND TUBES     TONSILLECTOMY     TOTAL MASTECTOMY Left 04/14/2017   Procedure: TOTAL MASTECTOMY;  Surgeon: Desiderio Schanz, MD;  Location: [facility] ORS;  Service: General;  Laterality: Left;   Family History  Problem Relation Age of Onset   Stroke Mother    Stroke Father    Cancer Sister        with mets as of 09/18/20   Cancer Daughter        as of 05/06/21 dx ? cancer type 58   Depression Neg Hx    Breast cancer Neg Hx    Social History   Socioeconomic History   Marital status: Widowed    Spouse name: Not on file   Number of children: 1   Years of  education: GED   Highest education level: 12th grade  Occupational History   Occupation: Retired  Tobacco Use   Smoking status: Never   Smokeless tobacco: Never   Tobacco comments:    smoking cessation materials not required  Vaping Use   Vaping status: Never Used  Substance and Sexual Activity   Alcohol use: No    Alcohol/week: 0.0 standard drinks of alcohol   Drug use: No   Sexual activity: Not Currently  Other Topics Concern   Not on file  Social History Narrative   Lives at home    1 kid [place] Free (daughter) [phone]   Social Drivers of Health   Financial Resource Strain: Low Risk  (05/17/2024)   Overall Financial Resource Strain (CARDIA)    Difficulty of Paying Living Expenses: Not hard at all  Food Insecurity: No Food Insecurity (05/17/2024)   Hunger Vital Sign    Worried About Running Out of Food in the Last Year: Never true    Ran Out of Food in the Last Year: Never true  Transportation Needs: No Transportation Needs (05/17/2024)   PRAPARE - Administrator, Civil Service (Medical): No    Lack of Transportation (Non-Medical): No  Physical Activity: Inactive (05/17/2024)    Exercise Vital Sign    Days of Exercise per Week: 0 days    Minutes of Exercise per Session: 0 min  Stress: No Stress Concern Present (05/17/2024)   [employer] of Occupational Health - Occupational Stress Questionnaire    Feeling of Stress: Not at all  Social Connections: Moderately Integrated (05/17/2024)   Social Connection and Isolation Panel    Frequency of Communication with Friends and Family: Never    Frequency of Social Gatherings with Friends and Family: More than three times a week    Attends Religious Services: More than 4 times per year    Active Member of [employer] or Organizations: Yes    Attends Banker Meetings: More than 4 times per year    Marital Status: Widowed    Tobacco Counseling Counseling given: Not Answered Tobacco comments: smoking cessation materials not required    Clinical Intake:  Pre-visit preparation completed: Yes  Pain : No/denies pain     BMI - recorded: 32.69 Nutritional Status: BMI > 30  Obese Nutritional Risks: None Diabetes: No  Lab Results  Component Value Date   HGBA1C 5.7 03/06/2024   HGBA1C 5.6 04/05/2023   HGBA1C 5.7 03/25/2022     How often do you need to have someone help you when you read instructions, pamphlets, or other written materials from your doctor or pharmacy?: 1 - Never  Interpreter Needed?: No  Information entered by :: R. Dashton Czerwinski LPN   Activities of Daily Living     05/17/2024   10:13 AM  In your present state of health, do you have any difficulty performing the following activities:  Hearing? 0  Vision? 0  Difficulty concentrating or making decisions? 0  Walking or climbing stairs? 0  Dressing or bathing? 0  Doing errands, shopping? 0  Preparing Food and eating ? N  Using the Toilet? N  In the past six months, have you accidently leaked urine? N  Do you have problems with loss of bowel control? N  Managing your Medications? N  Managing your Finances? N  Housekeeping or  managing your Housekeeping? N    Patient Care Team: Bair, Kalpana, MD as PCP - General (Family Medicine) Darliss Rogue, MD as PCP -  Cardiology (Cardiology) Dannielle Arlean FALCON, RN (Inactive) as Oncology Nurse Navigator Shelva Dunnings, MD as Consulting Physician (General Surgery) Jacobo Evalene PARAS, MD as Consulting Physician (Oncology) Desiderio Schanz, MD as Consulting Physician (Surgery) Lenn Aran, MD as Referring Physician (Radiation Oncology)  I have updated your Care Teams any recent Medical Services you may have received from other providers in the past year.     Assessment:   This is a routine wellness examination for Maria Patterson.  Hearing/Vision screen Hearing Screening - Comments:: No issues Vision Screening - Comments:: readers   Goals Addressed             This Visit's Progress    Patient Stated       Wants to eat a well balanced diet       Depression Screen     05/17/2024   10:17 AM 04/12/2023    8:54 AM 01/19/2023    1:38 PM 11/09/2022    8:05 AM 10/29/2022    1:36 PM 04/10/2022    2:22 PM 03/25/2022    9:08 AM  PHQ 2/9 Scores  PHQ - 2 Score 0 0 0 0 0 0 0  PHQ- 9 Score 0 0 0 0       Fall Risk     05/17/2024   10:14 AM 04/12/2023    8:54 AM 01/19/2023    1:38 PM 11/09/2022    8:05 AM 10/29/2022    1:40 PM  Fall Risk   Falls in the past year? 0 0 0 0 0  Number falls in past yr: 0 0 0 0 0  Injury with Fall? 0 0 0 0 0  Risk for fall due to : No Fall Risks No Fall Risks No Fall Risks No Fall Risks   Follow up Falls evaluation completed;Falls prevention discussed Falls evaluation completed Falls evaluation completed Falls evaluation completed Falls evaluation completed;Falls prevention discussed    MEDICARE RISK AT HOME:  Medicare Risk at Home Any stairs in or around the home?: No If so, are there any without handrails?: No Home free of loose throw rugs in walkways, pet beds, electrical cords, etc?: Yes Adequate lighting in your home to reduce risk of  falls?: Yes Life alert?: No Use of a cane, walker or w/c?: No Grab bars in the bathroom?: No Shower chair or bench in shower?: No Elevated toilet seat or a handicapped toilet?: No  TIMED UP AND GO:  Was the test performed?  No  Cognitive Function: 6CIT completed        05/17/2024   10:21 AM 10/29/2022    1:45 PM 01/27/2018   12:53 PM 09/18/2016   11:43 AM  6CIT Screen  What Year? 0 points 0 points 0 points 0 points  What month? 0 points 0 points 0 points 0 points  What time? 0 points 0 points 0 points 0 points  Count back from 20 0 points 0 points 0 points 0 points  Months in reverse 2 points 0 points 0 points 0 points  Repeat phrase 0 points 0 points 6 points 2 points  Total Score 2 points 0 points 6 points 2 points    Immunizations Immunization History  Administered Date(s) Administered   Fluad Quad(high Dose 65+) 07/02/2022   INFLUENZA, HIGH DOSE SEASONAL PF 09/10/2015, 09/01/2016, 04/15/2017, 04/27/2018, 04/14/2019, 06/28/2023   Influenza-Unspecified 03/28/2021, 07/02/2022, 05/12/2024   Moderna Covid-19 Fall Seasonal Vaccine 61yrs & older 07/02/2022   Moderna Covid-19 Vaccine Bivalent Booster 40yrs & up 03/28/2021   Moderna  SARS-COV2 Booster Vaccination 06/20/2020   Moderna Sars-Covid-2 Vaccination 08/07/2019, 09/04/2019   PNEUMOCOCCAL CONJUGATE-20 06/28/2023   Pneumococcal Conjugate-13 10/13/2013, 09/10/2015   Pneumococcal Polysaccharide-23 05/04/2017   Td (Adult),5 Lf Tetanus Toxid, Preservative Free 12/29/2005   Tdap 02/05/2021   Unspecified SARS-COV-2 Vaccination 07/02/2022   Zoster Recombinant(Shingrix) 05/12/2024    Screening Tests Health Maintenance  Topic Date Due   COVID-19 Vaccine (5 - 2025-26 season) 03/20/2024   Zoster Vaccines- Shingrix (2 of 2) 07/07/2024   Mammogram  10/24/2024   Medicare Annual Wellness (AWV)  05/17/2025   DTaP/Tdap/Td (2 - Td or Tdap) 02/06/2031   Pneumococcal Vaccine: 50+ Years  Completed   Influenza Vaccine  Completed    DEXA SCAN  Completed   Meningococcal B Vaccine  Aged Out    Health Maintenance Items Addressed: Patient stated that she has not decided if she is going to get any more covid vaccines. Patient declines Dexa scan at this time.  Additional Screening:  Vision Screening: Recommended annual ophthalmology exams for early detection of glaucoma and other disorders of the eye. Is the patient up to date with their annual eye exam?  Yes  Who is the provider or what is the name of the office in which the patient attends annual eye exams?  [facility]  Dental Screening: Recommended annual dental exams for proper oral hygiene  Community Resource Referral / Chronic Care Management: CRR required this visit?  No   CCM required this visit?  No   Plan:    I have personally reviewed and noted the following in the patient's chart:   Medical and social history Use of alcohol, tobacco or illicit drugs  Current medications and supplements including opioid prescriptions. Patient is not currently taking opioid prescriptions. Functional ability and status Nutritional status Physical activity Advanced directives List of other physicians Hospitalizations, surgeries, and ER visits in previous 12 months Vitals Screenings to include cognitive, depression, and falls Referrals and appointments  In addition, I have reviewed and discussed with patient certain preventive protocols, quality metrics, and best practice recommendations. A written personalized care plan for preventive services as well as general preventive health recommendations were provided to patient.   Angeline Fredericks, LPN   [id number]   After Visit Summary: (Pick Up) Due to this being a telephonic visit, with patients personalized plan was offered to patient and patient has requested to Pick up at office.  Notes: Nothing significant to report at this time.

## 2024-05-17 NOTE — Patient Instructions (Signed)
 [name],  Thank you for taking the time for your Medicare Wellness Visit. I appreciate your continued commitment to your health goals. Please review the care plan we discussed, and feel free to reach out if I can assist you further.  Medicare recommends these wellness visits once per year to help you and your care team stay ahead of potential health issues. These visits are designed to focus on prevention, allowing your provider to concentrate on managing your acute and chronic conditions during your regular appointments.  Please note that Annual Wellness Visits do not include a physical exam. Some assessments may be limited, especially if the visit was conducted virtually. If needed, we may recommend a separate in-person follow-up with your provider.  Ongoing Care Seeing your primary care provider every 3 to 6 months helps us  monitor your health and provide consistent, personalized care.  Consider updating your covid vaccine and Dexa/Bone density scan.   Referrals If a referral was made during today's visit and you haven't received any updates within two weeks, please contact the referred provider directly to check on the status.  Recommended Screenings:  Health Maintenance  Topic Date Due   COVID-19 Vaccine (5 - 2025-26 season) 03/20/2024   Zoster (Shingles) Vaccine (2 of 2) 07/07/2024   Breast Cancer Screening  10/24/2024   Medicare Annual Wellness Visit  05/17/2025   DTaP/Tdap/Td vaccine (2 - Td or Tdap) 02/06/2031   Pneumococcal Vaccine for age over 70  Completed   Flu Shot  Completed   DEXA scan (bone density measurement)  Completed   Meningitis B Vaccine  Aged Out       05/17/2024   10:21 AM  Advanced Directives  Does Patient Have a Medical Advance Directive? No  Would patient like information on creating a medical advance directive? No - Patient declined   Advance Care Planning is important because it: Ensures you receive medical care that aligns with your values,  goals, and preferences. Provides guidance to your family and loved ones, reducing the emotional burden of decision-making during critical moments.  Vision: Annual vision screenings are recommended for early detection of glaucoma, cataracts, and diabetic retinopathy. These exams can also reveal signs of chronic conditions such as diabetes and high blood pressure.  Dental: Annual dental screenings help detect early signs of oral cancer, gum disease, and other conditions linked to overall health, including heart disease and diabetes.  Please see the attached documents for additional preventive care recommendations.

## 2024-07-07 ENCOUNTER — Ambulatory Visit: Payer: Medicare (Managed Care)

## 2024-08-07 ENCOUNTER — Ambulatory Visit: Payer: Medicare (Managed Care)

## 2024-09-13 ENCOUNTER — Ambulatory Visit: Payer: Medicare (Managed Care)

## 2025-05-22 ENCOUNTER — Ambulatory Visit: Payer: Medicare (Managed Care)
